# Patient Record
Sex: Female | Born: 1940 | Race: White | Hispanic: No | Marital: Married | Smoking: Never smoker
Health system: Southern US, Community
[De-identification: ages and names within clinical notes are randomized; demographics above are authoritative.]

## PROBLEM LIST (undated history)

## (undated) DIAGNOSIS — E7800 Pure hypercholesterolemia, unspecified: Secondary | ICD-10-CM

## (undated) DIAGNOSIS — K209 Esophagitis, unspecified without bleeding: Secondary | ICD-10-CM

## (undated) DIAGNOSIS — E039 Hypothyroidism, unspecified: Secondary | ICD-10-CM

## (undated) DIAGNOSIS — I10 Essential (primary) hypertension: Secondary | ICD-10-CM

## (undated) DIAGNOSIS — N809 Endometriosis, unspecified: Secondary | ICD-10-CM

## (undated) DIAGNOSIS — Z8742 Personal history of other diseases of the female genital tract: Secondary | ICD-10-CM

## (undated) DIAGNOSIS — M1990 Unspecified osteoarthritis, unspecified site: Secondary | ICD-10-CM

## (undated) DIAGNOSIS — B019 Varicella without complication: Secondary | ICD-10-CM

## (undated) DIAGNOSIS — K219 Gastro-esophageal reflux disease without esophagitis: Secondary | ICD-10-CM

## (undated) HISTORY — DX: Essential (primary) hypertension: I10

## (undated) HISTORY — DX: Esophagitis, unspecified without bleeding: K20.90

## (undated) HISTORY — DX: Personal history of other diseases of the female genital tract: Z87.42

## (undated) HISTORY — DX: Unspecified osteoarthritis, unspecified site: M19.90

## (undated) HISTORY — PX: ABDOMINAL SURGERY: SHX537

## (undated) HISTORY — DX: Hypothyroidism, unspecified: E03.9

## (undated) HISTORY — DX: Gastro-esophageal reflux disease without esophagitis: K21.9

## (undated) HISTORY — DX: Varicella without complication: B01.9

## (undated) HISTORY — DX: Endometriosis, unspecified: N80.9

## (undated) HISTORY — PX: APPENDECTOMY: SHX54

## (undated) HISTORY — DX: Pure hypercholesterolemia, unspecified: E78.00

## (undated) HISTORY — DX: Esophagitis, unspecified: K20.9

---

## 2005-05-29 ENCOUNTER — Ambulatory Visit: Payer: Self-pay | Admitting: Internal Medicine

## 2006-03-22 ENCOUNTER — Ambulatory Visit: Payer: Self-pay | Admitting: Gastroenterology

## 2006-05-11 ENCOUNTER — Ambulatory Visit: Payer: Self-pay | Admitting: Internal Medicine

## 2006-06-15 ENCOUNTER — Ambulatory Visit: Payer: Self-pay | Admitting: Internal Medicine

## 2007-06-18 ENCOUNTER — Ambulatory Visit: Payer: Self-pay | Admitting: Internal Medicine

## 2008-06-19 ENCOUNTER — Ambulatory Visit: Payer: Self-pay | Admitting: Internal Medicine

## 2008-11-25 ENCOUNTER — Ambulatory Visit: Payer: Self-pay | Admitting: Urology

## 2008-12-03 ENCOUNTER — Ambulatory Visit: Payer: Self-pay | Admitting: Urology

## 2009-01-18 ENCOUNTER — Ambulatory Visit: Payer: Self-pay | Admitting: Internal Medicine

## 2009-08-05 ENCOUNTER — Ambulatory Visit: Payer: Self-pay | Admitting: Internal Medicine

## 2010-08-08 ENCOUNTER — Ambulatory Visit: Payer: Self-pay | Admitting: Internal Medicine

## 2011-10-02 ENCOUNTER — Ambulatory Visit: Payer: Self-pay | Admitting: Internal Medicine

## 2011-11-15 ENCOUNTER — Ambulatory Visit: Payer: Self-pay | Admitting: Internal Medicine

## 2011-11-22 ENCOUNTER — Ambulatory Visit: Payer: Self-pay | Admitting: Obstetrics and Gynecology

## 2012-08-26 ENCOUNTER — Ambulatory Visit: Payer: Self-pay | Admitting: Gastroenterology

## 2012-08-27 LAB — PATHOLOGY REPORT

## 2012-09-04 LAB — HM COLONOSCOPY

## 2012-10-21 ENCOUNTER — Encounter: Payer: Self-pay | Admitting: Internal Medicine

## 2012-10-21 ENCOUNTER — Other Ambulatory Visit (HOSPITAL_COMMUNITY)
Admission: RE | Admit: 2012-10-21 | Discharge: 2012-10-21 | Disposition: A | Discharge status: Home / Self Care | Payer: 59 | Source: Ambulatory Visit | Attending: Internal Medicine | Admitting: Internal Medicine

## 2012-10-21 ENCOUNTER — Ambulatory Visit (INDEPENDENT_AMBULATORY_CARE_PROVIDER_SITE_OTHER): Payer: 59 | Admitting: Internal Medicine

## 2012-10-21 VITALS — BP 120/80 | HR 74 | Temp 98.4°F | Ht 62.5 in | Wt 138.2 lb

## 2012-10-21 DIAGNOSIS — Z1151 Encounter for screening for human papillomavirus (HPV): Secondary | ICD-10-CM | POA: Insufficient documentation

## 2012-10-21 DIAGNOSIS — E7800 Pure hypercholesterolemia, unspecified: Secondary | ICD-10-CM

## 2012-10-21 DIAGNOSIS — Z139 Encounter for screening, unspecified: Secondary | ICD-10-CM

## 2012-10-21 DIAGNOSIS — I10 Essential (primary) hypertension: Secondary | ICD-10-CM

## 2012-10-21 DIAGNOSIS — Z01419 Encounter for gynecological examination (general) (routine) without abnormal findings: Secondary | ICD-10-CM | POA: Insufficient documentation

## 2012-10-21 DIAGNOSIS — R17 Unspecified jaundice: Secondary | ICD-10-CM

## 2012-10-21 DIAGNOSIS — E039 Hypothyroidism, unspecified: Secondary | ICD-10-CM

## 2012-10-21 DIAGNOSIS — R319 Hematuria, unspecified: Secondary | ICD-10-CM

## 2012-10-21 LAB — BASIC METABOLIC PANEL
CO2: 26 mEq/L (ref 19–32)
Calcium: 9.8 mg/dL (ref 8.4–10.5)
GFR: 95 mL/min (ref >60.00)
Potassium: 3.8 mEq/L (ref 3.5–5.1)
Sodium: 140 mEq/L (ref 135–145)

## 2012-10-21 LAB — LIPID PANEL
HDL: 50.3 mg/dL (ref >39.00)
LDL Cholesterol: 89 mg/dL (ref 0–99)
Total CHOL/HDL Ratio: 4
VLDL: 39.8 mg/dL (ref 0.0–40.0)

## 2012-10-21 LAB — HEPATIC FUNCTION PANEL
ALT: 29 U/L (ref 0–35)
Bilirubin, Direct: 0.1 mg/dL (ref 0.0–0.3)
Total Bilirubin: 1.3 mg/dL — ABNORMAL HIGH (ref 0.3–1.2)

## 2012-10-21 LAB — TSH: TSH: 1.11 u[IU]/mL (ref 0.35–5.50)

## 2012-10-21 MED ORDER — LEVOTHYROXINE SODIUM 88 MCG PO TABS: 88.0000 ug | ORAL_TABLET | Freq: Every day | ORAL | Status: DC

## 2012-10-21 MED ORDER — POTASSIUM CHLORIDE CRYS ER 20 MEQ PO TBCR: 20.0000 meq | EXTENDED_RELEASE_TABLET | Freq: Every day | ORAL | Status: DC

## 2012-10-21 MED ORDER — SIMVASTATIN 10 MG PO TABS: 10.0000 mg | ORAL_TABLET | Freq: Every day | ORAL | Status: DC

## 2012-10-21 MED ORDER — LISINOPRIL-HYDROCHLOROTHIAZIDE 20-25 MG PO TABS: 1.0000 | ORAL_TABLET | Freq: Every day | ORAL | Status: DC

## 2012-10-21 MED ORDER — OMEPRAZOLE 20 MG PO CPDR: 20.0000 mg | DELAYED_RELEASE_CAPSULE | Freq: Every day | ORAL | Status: DC

## 2012-10-21 NOTE — Progress Notes (Signed)
Subjective:    Patient ID: Crystal Lynch, female    DOB: 1940/11/08, 71 y.o.   MRN: [id number]  HPI 71 year old female with past history of hypertension, hypothyroidism and hypercholesterolemia who comes in today to follow up on these issues as well as for a complete physical exam.  She states she has been doing well.  No cardiac symptoms with increased activity or exertion.  Breathing stable.  No acid reflux.  Blood pressures averaging - 120-130s (occasionally 140)/70s.  No urine or bowel change. Has been released by Dr Greggory Keen.    Past Medical History  Diagnosis Date  . Hypertension   . Hypercholesterolemia   . Esophagitis     s/p esophageal dilatation  . Hypothyroidism   . Endometriosis   . History of ovarian cyst   . Arthritis   . GERD (gastroesophageal reflux disease)   . Chicken pox     Current Outpatient Prescriptions on File Prior to Visit  Medication Sig Dispense Refill  . Calcium Carbonate-Vitamin D (CALTRATE 600+D) 600-400 MG-UNIT per tablet Take 1 tablet by mouth 2 (two) times daily.      Marland Kitchen levothyroxine (SYNTHROID, LEVOTHROID) 88 MCG tablet Take 1 tablet (88 mcg total) by mouth daily.  90 tablet  3  . omeprazole (PRILOSEC) 20 MG capsule Take 1 capsule (20 mg total) by mouth daily.  90 capsule  3  . potassium chloride SA (K-DUR,KLOR-CON) 20 MEQ tablet Take 1 tablet (20 mEq total) by mouth daily.  90 tablet  3  . simvastatin (ZOCOR) 10 MG tablet Take 1 tablet (10 mg total) by mouth at bedtime.  90 tablet  3    Review of Systems Patient denies any headache, lightheadedness or dizziness.  No chest pain, tightness or palpitations.  No increased shortness of breath, cough or congestion.  No nausea or vomiting.  No acid reflux.  No abdominal pain or cramping.  No bowel change, such as diarrhea, constipation, BRBPR or melana.  No urine change.        Objective:   Physical Exam Filed Vitals:   10/21/12 0826  BP: 120/80  Pulse: 74  Temp: 98.4 F (36.9 C)   Blood  pressure recheck:  31/67  71 year old female in no acute distress.   HEENT:  Nares- clear.  Oropharynx - without lesions. NECK:  Supple.  Nontender.  No audible bruit.  HEART:  Appears to be regular. LUNGS:  No crackles or wheezing audible.  Respirations even and unlabored.  RADIAL PULSE:  Equal bilaterally.    BREASTS:  No nipple discharge or nipple retraction present.  Could not appreciate any distinct nodules or axillary adenopathy.  ABDOMEN:  Soft, nontender.  Bowel sounds present and normal.  No audible abdominal bruit.  GU:  Normal external genitalia.  Vaginal vault without lesions.  Cervix identified.  Pap performed.  Some atrophy changes present.  Could not appreciate any adnexal masses or tenderness.   RECTAL:  Heme negative.   EXTREMITIES:  No increased edema present.  DP pulses palpable and equal bilaterally.           Assessment & Plan:  FAMILY HISTORY OF BREAST CANCER.  Again discussed with her today regarding genetic testing.  Schedule a follow up mammogram.   PREVIOUS PELVIC PAIN.  Has been released by Dr Greggory Keen.  Doing well.    HEALTH MAINTENANCE.  Physical today.  Schedule mammogram.  Colonoscopy 09/05/12.  Obtain results.  Normal bone density 09/19/10.  Pneumovax 09/26/11.  Has  had shingles vaccine.

## 2012-10-22 LAB — CBC WITH DIFFERENTIAL/PLATELET
Basophils Absolute: 0 10*3/uL (ref 0.0–0.1)
Eosinophils Relative: 2.5 % (ref 0.0–5.0)
Monocytes Relative: 3.4 % (ref 3.0–12.0)
Neutrophils Relative %: 62.4 % (ref 43.0–77.0)
Platelets: 236 10*3/uL (ref 150.0–400.0)
RDW: 12.8 % (ref 11.5–14.6)
WBC: 5.3 10*3/uL (ref 4.5–10.5)

## 2012-10-23 ENCOUNTER — Telehealth: Payer: Self-pay | Admitting: Internal Medicine

## 2012-10-23 NOTE — Telephone Encounter (Signed)
Pt notified of lab results (left message on her machine).  Bilirubin just slightly elevated.  Probably normal variant.  Will recheck liver panel in one month.  Left message for pt that someone would call and schedule her a nonfasting lab appt in one month.  Please schedule and notify pt.  Thanks.

## 2012-10-25 ENCOUNTER — Encounter: Payer: Self-pay | Admitting: *Deleted

## 2012-10-27 ENCOUNTER — Encounter: Payer: Self-pay | Admitting: Internal Medicine

## 2012-10-27 DIAGNOSIS — R319 Hematuria, unspecified: Secondary | ICD-10-CM | POA: Insufficient documentation

## 2012-10-27 NOTE — Assessment & Plan Note (Signed)
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  Follow.

## 2012-10-27 NOTE — Assessment & Plan Note (Signed)
Blood pressure under good control.  Same medication.  Check metabolic panel.    

## 2012-10-27 NOTE — Assessment & Plan Note (Signed)
Low cholesterol diet and exercise.  On simvastatin.  Check lipid panel and liver function.    

## 2012-10-27 NOTE — Assessment & Plan Note (Signed)
Symptoms controlled on omeprazole.   

## 2012-10-27 NOTE — Assessment & Plan Note (Signed)
On thyroid replacement.  Check tsh.  

## 2012-10-27 NOTE — Assessment & Plan Note (Signed)
Had urine cytology and renal ultrasound and then subsequent dye study and states everything checked out fine.  Urinalysis 02/27/11 - 1-4 rbc's.  Stable.  Follow.

## 2012-11-05 NOTE — Telephone Encounter (Signed)
Husband is going to have his wife call us back to schedule

## 2012-11-08 ENCOUNTER — Encounter: Payer: Self-pay | Admitting: Internal Medicine

## 2012-11-13 ENCOUNTER — Other Ambulatory Visit (INDEPENDENT_AMBULATORY_CARE_PROVIDER_SITE_OTHER): Payer: 59

## 2012-11-13 DIAGNOSIS — R17 Unspecified jaundice: Secondary | ICD-10-CM

## 2012-11-13 LAB — HEPATIC FUNCTION PANEL
AST: 25 U/L (ref 0–37)
Albumin: 4.3 g/dL (ref 3.5–5.2)
Alkaline Phosphatase: 53 U/L (ref 39–117)
Total Protein: 7.1 g/dL (ref 6.0–8.3)

## 2012-11-19 ENCOUNTER — Ambulatory Visit: Payer: Self-pay | Admitting: Internal Medicine

## 2012-11-27 ENCOUNTER — Encounter: Payer: Self-pay | Admitting: Internal Medicine

## 2013-01-06 ENCOUNTER — Ambulatory Visit (INDEPENDENT_AMBULATORY_CARE_PROVIDER_SITE_OTHER): Payer: 59 | Admitting: Internal Medicine

## 2013-01-06 ENCOUNTER — Encounter: Payer: Self-pay | Admitting: Internal Medicine

## 2013-01-06 ENCOUNTER — Telehealth: Payer: Self-pay | Admitting: Internal Medicine

## 2013-01-06 VITALS — BP 124/80 | HR 76 | Temp 98.3°F | Ht 62.5 in | Wt 139.0 lb

## 2013-01-06 DIAGNOSIS — I10 Essential (primary) hypertension: Secondary | ICD-10-CM

## 2013-01-06 DIAGNOSIS — N390 Urinary tract infection, site not specified: Secondary | ICD-10-CM

## 2013-01-06 LAB — POCT URINALYSIS DIPSTICK
Bilirubin, UA: NEGATIVE
Nitrite, UA: POSITIVE
pH, UA: 5

## 2013-01-06 MED ORDER — CIPROFLOXACIN HCL 500 MG PO TABS: 500.0000 mg | ORAL_TABLET | Freq: Two times a day (BID) | ORAL | Status: DC

## 2013-01-06 NOTE — Telephone Encounter (Signed)
Caller: Crystal Lynch/Patient; Phone: [phone]; Reason for Call: Pt calling today requesting antibiotics be called in for UA.  Advised pt office policy is that antibiotics are not usually called in, the MD likes to see pt in office.  Having blood in urine.  Declines triage assessment.  Said she is willing to come in just for a U/A and have MD call in the antibiotics.  Started having pain with urination on Saturday and started with blood in urine on Sunday.  Has been taking AZO OTC for urinary pain.  PLEASE CALL PT BACK AT [phone] TO LET HER KNOW IF MD WILL CALL IN ANTIBIOTICS OR IF SHE CAN JUST COME IN FOR U/A.  SAID SHE HAD HER PHYSICAL 2 MONTHS AGO.  Thanks.

## 2013-01-06 NOTE — Telephone Encounter (Addendum)
Called patient back, I explained to patient that you will try to fit her in for office visit.  Made appt for today for patient to be seen.

## 2013-01-06 NOTE — Telephone Encounter (Signed)
It is a clinic decision - will have to be seen to treat.  I can work her in for this.

## 2013-01-07 ENCOUNTER — Encounter: Payer: Self-pay | Admitting: Internal Medicine

## 2013-01-07 NOTE — Progress Notes (Signed)
  Subjective:    Patient ID: Crystal Lynch, female    DOB: [birth year], 72 y.o.   MRN: [id number]  Urinary Tract Infection   72 year old female with past history of hypertension, hypothyroidism and hypercholesterolemia who comes in today as a work in with concerns regarding increased urinary frequency and discomfort.  She states her symptoms started a few days ago - with increased urinary frequency.  Noticed blood in her urine yesterday am.  Some increased lower abdominal pressure.  Some minimal back pain.  Symptoms improved today (some) after taking AZO.  Eating and drinking well.  No nausea or vomiting.  Has taken cipro previously.  Tolerated.  No vaginal symptoms.      Past Medical History  Diagnosis Date  . Hypertension   . Hypercholesterolemia   . Esophagitis     s/p esophageal dilatation  . Hypothyroidism   . Endometriosis   . History of ovarian cyst   . Arthritis   . GERD (gastroesophageal reflux disease)   . Chicken pox     Current Outpatient Prescriptions on File Prior to Visit  Medication Sig Dispense Refill  . aspirin 81 MG tablet Take 81 mg by mouth daily.      . Calcium Carbonate-Vitamin D (CALTRATE 600+D) 600-400 MG-UNIT per tablet Take 1 tablet by mouth 2 (two) times daily.      . Glucosamine-Chondroitin ([facility] GLUCOSAMINE-CHONDROITIN) 750-600 MG TABS Take 600 mg by mouth 2 (two) times daily.      Marland Kitchen levothyroxine (SYNTHROID, LEVOTHROID) 88 MCG tablet Take 1 tablet (88 mcg total) by mouth daily.  90 tablet  3  . lisinopril-hydrochlorothiazide (PRINZIDE,ZESTORETIC) 20-25 MG per tablet Take 1 tablet by mouth daily.  90 tablet  3  . Multiple Vitamin (MULTIVITAMIN) tablet Take 1 tablet by mouth daily.      Marland Kitchen omeprazole (PRILOSEC) 20 MG capsule Take 1 capsule (20 mg total) by mouth daily.  90 capsule  3  . potassium chloride SA (K-DUR,KLOR-CON) 20 MEQ tablet Take 1 tablet (20 mEq total) by mouth daily.  90 tablet  3  . simvastatin (ZOCOR) 10 MG tablet Take 1 tablet (10 mg total)  by mouth at bedtime.  90 tablet  3   No current facility-administered medications on file prior to visit.    Review of Systems Urinary symptoms as outlined.  Noted increased frequency, then dysuria.  Some hematuria yesterday am.  AZO helped symptoms.  No nausea or vomiting.  No bowel change.  Eating and drinking well. No vaginal symptoms.          Objective:   Physical Exam  Filed Vitals:   01/06/13 1623  BP: 124/80  Pulse: 76  Temp: 98.3 F (57.60 C)   72 year old female in no acute distress.  HEART:  Appears to be regular. LUNGS:  No crackles or wheezing audible.  Respirations even and unlabored. ABDOMEN:  Soft.  Minimal suprapubic tenderness to palpation.  Bowel sounds present and normal.  No audible abdominal bruit.  BACK:  No CVA tenderness.  nontender.       Assessment & Plan:  UTI.  Urine dip appears consistent with a urinary tract infection.  Will send for culture.  Treat with cipro 500mg  bid x 5 days.  Follow.     HEALTH MAINTENANCE.  Physical last visit.  Colonoscopy 09/05/12.  Obtain results.  Normal bone density 09/19/10.  Pneumovax 09/26/11.  Has had shingles vaccine.

## 2013-01-07 NOTE — Assessment & Plan Note (Signed)
Blood pressure under good control.  Same medication.

## 2013-01-08 LAB — URINE CULTURE

## 2013-01-10 ENCOUNTER — Telehealth: Payer: Self-pay | Admitting: Internal Medicine

## 2013-01-10 ENCOUNTER — Other Ambulatory Visit: Payer: Self-pay | Admitting: Internal Medicine

## 2013-01-10 DIAGNOSIS — R319 Hematuria, unspecified: Secondary | ICD-10-CM

## 2013-01-10 NOTE — Telephone Encounter (Signed)
Returning call . Waiting to know the results of her blood work.

## 2013-01-10 NOTE — Progress Notes (Signed)
Order placed for a follow up urine.

## 2013-01-10 NOTE — Telephone Encounter (Signed)
Patient notified.  Appt made.

## 2013-01-24 ENCOUNTER — Telehealth: Payer: Self-pay | Admitting: *Deleted

## 2013-01-24 NOTE — Telephone Encounter (Signed)
Pt is coming in for labs Monday 04.07.2014, is it just for a urine?  Thank you

## 2013-01-26 NOTE — Telephone Encounter (Signed)
Yes, just a urine.  Can you hold for culture?

## 2013-01-27 ENCOUNTER — Telehealth: Payer: Self-pay | Admitting: *Deleted

## 2013-01-27 ENCOUNTER — Ambulatory Visit: Payer: 59

## 2013-01-27 ENCOUNTER — Other Ambulatory Visit (INDEPENDENT_AMBULATORY_CARE_PROVIDER_SITE_OTHER): Payer: 59

## 2013-01-27 DIAGNOSIS — R319 Hematuria, unspecified: Secondary | ICD-10-CM

## 2013-01-27 LAB — URINALYSIS, ROUTINE W REFLEX MICROSCOPIC
Ketones, ur: NEGATIVE
Leukocytes, UA: NEGATIVE
Specific Gravity, Urine: 1.015 (ref 1.000–1.030)
Urobilinogen, UA: 0.2 (ref 0.0–1.0)

## 2013-01-27 NOTE — Telephone Encounter (Signed)
Please add a urine culture.  Let me know if you are not able.

## 2013-01-27 NOTE — Telephone Encounter (Signed)
Im going to send the add request form

## 2013-01-28 LAB — URINE CULTURE: Organism ID, Bacteria: NO GROWTH

## 2013-01-29 ENCOUNTER — Encounter: Payer: Self-pay | Admitting: Internal Medicine

## 2013-04-22 ENCOUNTER — Ambulatory Visit (INDEPENDENT_AMBULATORY_CARE_PROVIDER_SITE_OTHER): Payer: 59 | Admitting: Internal Medicine

## 2013-04-22 ENCOUNTER — Encounter: Payer: Self-pay | Admitting: Internal Medicine

## 2013-04-22 VITALS — BP 130/80 | HR 65 | Temp 98.0°F | Ht 62.5 in | Wt 132.2 lb

## 2013-04-22 DIAGNOSIS — R319 Hematuria, unspecified: Secondary | ICD-10-CM

## 2013-04-22 DIAGNOSIS — N390 Urinary tract infection, site not specified: Secondary | ICD-10-CM

## 2013-04-22 DIAGNOSIS — I10 Essential (primary) hypertension: Secondary | ICD-10-CM

## 2013-04-22 DIAGNOSIS — R17 Unspecified jaundice: Secondary | ICD-10-CM

## 2013-04-22 DIAGNOSIS — E7800 Pure hypercholesterolemia, unspecified: Secondary | ICD-10-CM

## 2013-04-22 DIAGNOSIS — E039 Hypothyroidism, unspecified: Secondary | ICD-10-CM

## 2013-04-22 LAB — HEPATIC FUNCTION PANEL
ALT: 22 U/L (ref 0–35)
AST: 21 U/L (ref 0–37)
Alkaline Phosphatase: 55 U/L (ref 39–117)
Bilirubin, Direct: 0.2 mg/dL (ref 0.0–0.3)
Total Bilirubin: 1.4 mg/dL — ABNORMAL HIGH (ref 0.3–1.2)
Total Protein: 6.7 g/dL (ref 6.0–8.3)

## 2013-04-22 LAB — POCT URINALYSIS DIPSTICK
Bilirubin, UA: NEGATIVE
Glucose, UA: NEGATIVE
Ketones, UA: NEGATIVE
Leukocytes, UA: NEGATIVE
Nitrite, UA: NEGATIVE
pH, UA: 7

## 2013-04-22 LAB — LIPID PANEL
LDL Cholesterol: 105 mg/dL — ABNORMAL HIGH (ref 0–99)
Total CHOL/HDL Ratio: 4

## 2013-04-22 LAB — BASIC METABOLIC PANEL
CO2: 28 mEq/L (ref 19–32)
Chloride: 101 mEq/L (ref 96–112)
Creatinine, Ser: 0.8 mg/dL (ref 0.4–1.2)
Potassium: 4 mEq/L (ref 3.5–5.1)

## 2013-04-23 ENCOUNTER — Encounter: Payer: Self-pay | Admitting: Internal Medicine

## 2013-04-23 NOTE — Assessment & Plan Note (Signed)
Had urine cytology and renal ultrasound and then subsequent dye study and states everything checked out fine.  Urinalysis 02/27/11 - 1-4 rbc's.  Stable.  Will recheck urinalysis today given increased frequency.

## 2013-04-23 NOTE — Assessment & Plan Note (Signed)
Low cholesterol diet and exercise.  On simvastatin.  Check lipid panel and liver function.    

## 2013-04-23 NOTE — Assessment & Plan Note (Signed)
Blood pressure under good control.  Same medication.  Follow metabolic panel.   

## 2013-04-23 NOTE — Progress Notes (Signed)
Subjective:    Patient ID: Crystal Lynch, female    DOB: 12-Sep-1941, 72 y.o.   MRN: [id number]  Urinary Frequency  Associated symptoms include frequency.  72 year old female with past history of hypertension, hypothyroidism and hypercholesterolemia who comes in today for a scheduled follow up.  She states she has been doing well.  No cardiac symptoms with increased activity or exertion.  Breathing stable.  No acid reflux.  Blood pressures doing well.  No bowel change. Has been released by Dr Greggory Keen.  She does report some increased urinary frequency.  No dysuria.  Still occasionally will notice some minimal right lower quadrant discomfort.  No persistent.  Denies now.     Past Medical History  Diagnosis Date  . Hypertension   . Hypercholesterolemia   . Esophagitis     s/p esophageal dilatation  . Hypothyroidism   . Endometriosis   . History of ovarian cyst   . Arthritis   . GERD (gastroesophageal reflux disease)   . Chicken pox     Current Outpatient Prescriptions on File Prior to Visit  Medication Sig Dispense Refill  . aspirin 81 MG tablet Take 81 mg by mouth daily.      . Calcium Carbonate-Vitamin D (CALTRATE 600+D) 600-400 MG-UNIT per tablet Take 1 tablet by mouth 2 (two) times daily.      . Glucosamine-Chondroitin ([facility] GLUCOSAMINE-CHONDROITIN) 750-600 MG TABS Take 600 mg by mouth 2 (two) times daily.      Marland Kitchen levothyroxine (SYNTHROID, LEVOTHROID) 88 MCG tablet Take 1 tablet (88 mcg total) by mouth daily.  90 tablet  3  . lisinopril-hydrochlorothiazide (PRINZIDE,ZESTORETIC) 20-25 MG per tablet Take 1 tablet by mouth daily.  90 tablet  3  . Multiple Vitamin (MULTIVITAMIN) tablet Take 1 tablet by mouth daily.      Marland Kitchen omeprazole (PRILOSEC) 20 MG capsule Take 1 capsule (20 mg total) by mouth daily.  90 capsule  3  . potassium chloride SA (K-DUR,KLOR-CON) 20 MEQ tablet Take 1 tablet (20 mEq total) by mouth daily.  90 tablet  3  . simvastatin (ZOCOR) 10 MG tablet Take 1 tablet (10 mg  total) by mouth at bedtime.  90 tablet  3   No current facility-administered medications on file prior to visit.    Review of Systems  Genitourinary: Positive for frequency.  Patient denies any headache, lightheadedness or dizziness.  No significant sinus or allergy symptoms.  No chest pain, tightness or palpitations.  No increased shortness of breath, cough or congestion.  No nausea or vomiting.  No acid reflux.  No abdominal pain or cramping currently.  Occasionally will notice some minimal right lower quadrant discomfort.  No bowel change, such as diarrhea, constipation, BRBPR or melana.  Some urinary frequency as outlined.  No hematuria and no dysuria.         Objective:   Physical Exam  Filed Vitals:   04/22/13 0819  BP: 130/80  Pulse: 65  Temp: 98 F (36.7 C)   Pulse 43  72 year old female in no acute distress.   HEENT:  Nares- clear.  Oropharynx - without lesions. NECK:  Supple.  Nontender.  No audible bruit.  HEART:  Appears to be regular. LUNGS:  No crackles or wheezing audible.  Respirations even and unlabored.  RADIAL PULSE:  Equal bilaterally.   ABDOMEN:  Soft, nontender.  Bowel sounds present and normal.  No audible abdominal bruit.  EXTREMITIES:  No increased edema present.  DP pulses palpable and equal  bilaterally.           Assessment & Plan:  FAMILY HISTORY OF BREAST CANCER.  Have discussed with her regarding genetic testing.  She desires not to pursue.    PREVIOUS PELVIC PAIN.  Has been released by Dr Greggory Keen.  Still some intermittent discomfort.  None now.  Wants to monitor.  Will follow.   HEALTH MAINTENANCE.  Physical 10/21/12.   Mammogram 11/19/12- [facility].  Colonoscopy 09/05/12.  Normal bone density 09/19/10.  Pneumovax 09/26/11.  Has had shingles vaccine.

## 2013-04-23 NOTE — Assessment & Plan Note (Signed)
Symptoms controlled on omeprazole.   

## 2013-04-23 NOTE — Assessment & Plan Note (Signed)
On thyroid replacement.  Follow tsh.  Last tsh wnl.   

## 2013-04-23 NOTE — Assessment & Plan Note (Signed)
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  Follow.  Last bilirubin wnl.   

## 2013-04-24 ENCOUNTER — Other Ambulatory Visit: Payer: Self-pay | Admitting: Internal Medicine

## 2013-04-24 DIAGNOSIS — R319 Hematuria, unspecified: Secondary | ICD-10-CM

## 2013-04-24 LAB — URINE CULTURE: Colony Count: 15000

## 2013-04-24 NOTE — Progress Notes (Signed)
Ordered f/u urinalysis

## 2013-08-06 ENCOUNTER — Telehealth: Payer: Self-pay | Admitting: *Deleted

## 2013-08-06 NOTE — Telephone Encounter (Signed)
Refill Request:  Pot chlor ER (Klor-Con M20) TB 20 MEQ  #90  Take one tablet daily

## 2013-08-07 ENCOUNTER — Other Ambulatory Visit: Payer: Self-pay | Admitting: *Deleted

## 2013-08-07 MED ORDER — POTASSIUM CHLORIDE CRYS ER 20 MEQ PO TBCR: 20.0000 meq | EXTENDED_RELEASE_TABLET | Freq: Every day | ORAL | Status: DC

## 2013-08-12 ENCOUNTER — Telehealth: Payer: Self-pay | Admitting: Internal Medicine

## 2013-08-12 NOTE — Telephone Encounter (Signed)
The patient is out of town and she is needing an antibiotic for a UTI . If you call this medication into the pharmacy the [facility] is [facility] [place] ,[place] phone# [phone] .

## 2013-08-12 NOTE — Telephone Encounter (Signed)
Please call pt and let her know that we are unable now to call in abx over the phone without seeing the pt.  I can see her when she returns (if coming back soon) or she will need eval while there - at urgent care.

## 2013-08-12 NOTE — Telephone Encounter (Signed)
Please advise 

## 2013-08-12 NOTE — Telephone Encounter (Signed)
Left detailed voicemail

## 2013-09-01 ENCOUNTER — Other Ambulatory Visit: Payer: Self-pay | Admitting: Internal Medicine

## 2013-09-30 ENCOUNTER — Other Ambulatory Visit: Payer: Self-pay | Admitting: Internal Medicine

## 2013-10-26 ENCOUNTER — Other Ambulatory Visit: Payer: Self-pay | Admitting: Internal Medicine

## 2013-10-26 DIAGNOSIS — R319 Hematuria, unspecified: Secondary | ICD-10-CM

## 2013-10-26 DIAGNOSIS — E039 Hypothyroidism, unspecified: Secondary | ICD-10-CM

## 2013-10-26 DIAGNOSIS — E7800 Pure hypercholesterolemia, unspecified: Secondary | ICD-10-CM

## 2013-10-26 DIAGNOSIS — I10 Essential (primary) hypertension: Secondary | ICD-10-CM

## 2013-10-27 ENCOUNTER — Encounter: Payer: Self-pay | Admitting: Internal Medicine

## 2013-10-27 ENCOUNTER — Ambulatory Visit (INDEPENDENT_AMBULATORY_CARE_PROVIDER_SITE_OTHER): Payer: 59 | Admitting: Internal Medicine

## 2013-10-27 ENCOUNTER — Encounter (INDEPENDENT_AMBULATORY_CARE_PROVIDER_SITE_OTHER): Payer: Self-pay

## 2013-10-27 VITALS — BP 120/80 | HR 81 | Temp 98.1°F | Ht 62.25 in | Wt 138.8 lb

## 2013-10-27 DIAGNOSIS — R17 Unspecified jaundice: Secondary | ICD-10-CM

## 2013-10-27 DIAGNOSIS — R319 Hematuria, unspecified: Secondary | ICD-10-CM

## 2013-10-27 DIAGNOSIS — E039 Hypothyroidism, unspecified: Secondary | ICD-10-CM

## 2013-10-27 DIAGNOSIS — K209 Esophagitis, unspecified without bleeding: Secondary | ICD-10-CM

## 2013-10-27 DIAGNOSIS — E7800 Pure hypercholesterolemia, unspecified: Secondary | ICD-10-CM

## 2013-10-27 DIAGNOSIS — I10 Essential (primary) hypertension: Secondary | ICD-10-CM

## 2013-10-27 DIAGNOSIS — E2839 Other primary ovarian failure: Secondary | ICD-10-CM

## 2013-10-27 DIAGNOSIS — Z780 Asymptomatic menopausal state: Secondary | ICD-10-CM

## 2013-10-27 LAB — URINALYSIS, ROUTINE W REFLEX MICROSCOPIC
BILIRUBIN URINE: NEGATIVE
KETONES UR: NEGATIVE
LEUKOCYTES UA: NEGATIVE
Nitrite: NEGATIVE
PH: 6 (ref 5.0–8.0)
SPECIFIC GRAVITY, URINE: 1.015 (ref 1.000–1.030)
Total Protein, Urine: NEGATIVE
URINE GLUCOSE: NEGATIVE
Urobilinogen, UA: 0.2 (ref 0.0–1.0)

## 2013-10-27 LAB — CBC WITH DIFFERENTIAL/PLATELET
BASOS ABS: 0 10*3/uL (ref 0.0–0.1)
Basophils Relative: 0.3 % (ref 0.0–3.0)
EOS ABS: 0.1 10*3/uL (ref 0.0–0.7)
Eosinophils Relative: 1.9 % (ref 0.0–5.0)
HEMATOCRIT: 44.1 % (ref 36.0–46.0)
HEMOGLOBIN: 15.1 g/dL — AB (ref 12.0–15.0)
Lymphocytes Relative: 35.4 % (ref 12.0–46.0)
Lymphs Abs: 2.2 10*3/uL (ref 0.7–4.0)
MCHC: 34.3 g/dL (ref 30.0–36.0)
MCV: 88.8 fl (ref 78.0–100.0)
Monocytes Absolute: 0.4 10*3/uL (ref 0.1–1.0)
Monocytes Relative: 7.3 % (ref 3.0–12.0)
NEUTROS ABS: 3.4 10*3/uL (ref 1.4–7.7)
Neutrophils Relative %: 55.1 % (ref 43.0–77.0)
Platelets: 250 10*3/uL (ref 150.0–400.0)
RBC: 4.96 Mil/uL (ref 3.87–5.11)
RDW: 13.2 % (ref 11.5–14.6)
WBC: 6.1 10*3/uL (ref 4.5–10.5)

## 2013-10-27 LAB — HEPATIC FUNCTION PANEL
ALT: 30 U/L (ref 0–35)
AST: 28 U/L (ref 0–37)
Albumin: 4.6 g/dL (ref 3.5–5.2)
Alkaline Phosphatase: 51 U/L (ref 39–117)
BILIRUBIN TOTAL: 1.2 mg/dL (ref 0.3–1.2)
Bilirubin, Direct: 0.1 mg/dL (ref 0.0–0.3)
Total Protein: 7.2 g/dL (ref 6.0–8.3)

## 2013-10-27 LAB — LIPID PANEL
Cholesterol: 192 mg/dL (ref 0–200)
HDL: 55.4 mg/dL (ref >39.00)
LDL Cholesterol: 114 mg/dL — ABNORMAL HIGH (ref 0–99)
Total CHOL/HDL Ratio: 3
Triglycerides: 113 mg/dL (ref 0.0–149.0)
VLDL: 22.6 mg/dL (ref 0.0–40.0)

## 2013-10-27 LAB — BASIC METABOLIC PANEL
BUN: 16 mg/dL (ref 6–23)
CO2: 28 meq/L (ref 19–32)
Calcium: 10.2 mg/dL (ref 8.4–10.5)
Chloride: 99 mEq/L (ref 96–112)
Creatinine, Ser: 0.9 mg/dL (ref 0.4–1.2)
GFR: 68.58 mL/min (ref >60.00)
Glucose, Bld: 92 mg/dL (ref 70–99)
Potassium: 4.1 mEq/L (ref 3.5–5.1)
SODIUM: 136 meq/L (ref 135–145)

## 2013-10-27 LAB — TSH: TSH: 1.21 u[IU]/mL (ref 0.35–5.50)

## 2013-10-27 NOTE — Assessment & Plan Note (Signed)
Low cholesterol diet and exercise.  On simvastatin.  Check lipid panel and liver function.    

## 2013-10-27 NOTE — Progress Notes (Signed)
Pre-visit discussion using our clinic review tool. No additional management support is needed unless otherwise documented below in the visit note.  

## 2013-10-27 NOTE — Assessment & Plan Note (Signed)
Had urine cytology and renal ultrasound and then subsequent dye study and states everything checked out fine.  Follow urinalysis.  Previous uti.  Treated.  Symptoms improved.

## 2013-10-27 NOTE — Assessment & Plan Note (Signed)
On thyroid replacement.  Follow tsh.  Last tsh wnl.   

## 2013-10-27 NOTE — Assessment & Plan Note (Signed)
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  Follow.  Last bilirubin wnl.

## 2013-10-27 NOTE — Assessment & Plan Note (Signed)
Symptoms controlled on omeprazole.   

## 2013-10-27 NOTE — Assessment & Plan Note (Signed)
Blood pressure under good control.  Same medication.  Follow metabolic panel.   

## 2013-10-27 NOTE — Progress Notes (Signed)
Subjective:    Patient ID: Crystal Lynch, female    DOB: [birth year], 73 y.o.   MRN: [id number]  HPI 73 year old female with past history of hypertension, hypothyroidism and hypercholesterolemia who comes in today to follow up on these issues as well as for a complete physical exam.  She states she has been doing well.  No cardiac symptoms with increased activity or exertion.  Breathing stable.  No acid reflux.  Blood pressures averaging - 120-130s/70-80.    No urine or bowel change. Has been released by Dr Enzo Bi.  Occasionally will notice minimal right lower quadrant discomfort.  Rarely occurs.  Nothing persistent.  She was treated for UTI (10/14) with bactrim.  Symptoms have resolved.  Still some increased urinary frequency.  No vaginal problems.  Overall she feels she is doing well.    Past Medical History  Diagnosis Date  . Hypertension   . Hypercholesterolemia   . Esophagitis     s/p esophageal dilatation  . Hypothyroidism   . Endometriosis   . History of ovarian cyst   . Arthritis   . GERD (gastroesophageal reflux disease)   . Chicken pox     Current Outpatient Prescriptions on File Prior to Visit  Medication Sig Dispense Refill  . aspirin 81 MG tablet Take 81 mg by mouth daily.      . Calcium Carbonate-Vitamin D (CALTRATE 600+D) 600-400 MG-UNIT per tablet Take 1 tablet by mouth 2 (two) times daily.      . Glucosamine-Chondroitin ([facility] GLUCOSAMINE-CHONDROITIN) 750-600 MG TABS Take 600 mg by mouth 2 (two) times daily.      Marland Kitchen levothyroxine (SYNTHROID, LEVOTHROID) 88 MCG tablet TAKE 1 TABLET DAILY  90 tablet  1  . lisinopril-hydrochlorothiazide (PRINZIDE,ZESTORETIC) 20-25 MG per tablet TAKE 1 TABLET DAILY  90 tablet  1  . Multiple Vitamin (MULTIVITAMIN) tablet Take 1 tablet by mouth daily.      Marland Kitchen omeprazole (PRILOSEC) 20 MG capsule Take 1 capsule (20 mg total) by mouth daily.  90 capsule  3  . potassium chloride SA (K-DUR,KLOR-CON) 20 MEQ tablet Take 1 tablet (20 mEq total) by  mouth daily.  90 tablet  2  . simvastatin (ZOCOR) 10 MG tablet TAKE 1 TABLET AT BEDTIME  90 tablet  1   No current facility-administered medications on file prior to visit.    Review of Systems Patient denies any headache, lightheadedness or dizziness.  No significant sinus or allergy symptoms.   No chest pain, tightness or palpitations.  No increased shortness of breath, cough or congestion.  No nausea or vomiting.  No acid reflux.  No abdominal pain or cramping.  No bowel change, such as diarrhea, constipation, BRBPR or melana.  Some increased urinary frequency.  No dysuria.  No vaginal problems.        Objective:   Physical Exam  Filed Vitals:   10/27/13 0823  BP: 120/80  Pulse: 81  Temp: 98.1 F (36.7 C)   Blood pressure recheck:  54/32  73 year old female in no acute distress.   HEENT:  Nares- clear.  Oropharynx - without lesions. NECK:  Supple.  Nontender.  No audible bruit.  HEART:  Appears to be regular. LUNGS:  No crackles or wheezing audible.  Respirations even and unlabored.  RADIAL PULSE:  Equal bilaterally.    BREASTS:  No nipple discharge or nipple retraction present.  Could not appreciate any distinct nodules or axillary adenopathy.  ABDOMEN:  Soft, nontender.  Bowel sounds present and  normal.  No audible abdominal bruit.  GU:  Not performed.    EXTREMITIES:  No increased edema present.         Assessment & Plan:  FAMILY HISTORY OF BREAST CANCER.  Have discussed with her today regarding genetic testing.  Schedule a follow up mammogram.  Last 11/19/12.    PREVIOUS PELVIC PAIN.  Has been released by Dr Enzo Bi.  Doing well.    HEALTH MAINTENANCE.  Physical today.  Schedule mammogram when due.  Last mammogram 11/19/12 - ok.  Colonoscopy 09/05/12.  Normal bone density 09/19/10.  With loss of height, will recheck bone density.

## 2013-10-29 LAB — CULTURE, URINE COMPREHENSIVE
Colony Count: NO GROWTH
ORGANISM ID, BACTERIA: NO GROWTH

## 2013-11-20 ENCOUNTER — Ambulatory Visit: Payer: Self-pay | Admitting: Internal Medicine

## 2013-11-20 LAB — HM MAMMOGRAPHY

## 2013-11-24 ENCOUNTER — Ambulatory Visit (INDEPENDENT_AMBULATORY_CARE_PROVIDER_SITE_OTHER): Payer: 59 | Admitting: Adult Health

## 2013-11-24 ENCOUNTER — Encounter: Payer: Self-pay | Admitting: Adult Health

## 2013-11-24 VITALS — BP 128/66 | HR 70 | Temp 97.7°F | Resp 12 | Wt 139.0 lb

## 2013-11-24 DIAGNOSIS — R319 Hematuria, unspecified: Secondary | ICD-10-CM

## 2013-11-24 DIAGNOSIS — R509 Fever, unspecified: Secondary | ICD-10-CM

## 2013-11-24 DIAGNOSIS — J111 Influenza due to unidentified influenza virus with other respiratory manifestations: Secondary | ICD-10-CM

## 2013-11-24 LAB — POCT URINALYSIS DIPSTICK
Bilirubin, UA: NEGATIVE
GLUCOSE UA: NEGATIVE
KETONES UA: NEGATIVE
Nitrite, UA: NEGATIVE
PH UA: 6
Protein, UA: NEGATIVE
Spec Grav, UA: 1.02
Urobilinogen, UA: 0.2

## 2013-11-24 LAB — POCT INFLUENZA A/B
INFLUENZA B, POC: POSITIVE
Influenza A, POC: POSITIVE

## 2013-11-24 MED ORDER — CIPROFLOXACIN HCL 250 MG PO TABS: 250.0000 mg | ORAL_TABLET | Freq: Two times a day (BID) | ORAL | Status: DC

## 2013-11-24 MED ORDER — OSELTAMIVIR PHOSPHATE 75 MG PO CAPS: 75.0000 mg | ORAL_CAPSULE | Freq: Two times a day (BID) | ORAL | Status: DC

## 2013-11-24 MED ORDER — GUAIFENESIN-CODEINE 100-10 MG/5ML PO SOLN: 5.0000 mL | Freq: Three times a day (TID) | ORAL | Status: DC | PRN

## 2013-11-24 NOTE — Progress Notes (Signed)
   Subjective:    Patient ID: Crystal Lynch, female    DOB: 04-19-1941, 73 y.o.   MRN: [id number]  HPI  Patient is a 73 year old female who presents to clinic with the following concerns:  1. Productive cough which started on Friday. She has also been running a temperature of 101 over the weekend.  Some rhinorrhea. She also reports having body aches and discomfort. She has been taking   2. Hematuria that started today. Some dysuria. Last UTI ~ 4-5 months ago.    Current Outpatient Prescriptions on File Prior to Visit  Medication Sig Dispense Refill  . aspirin 81 MG tablet Take 81 mg by mouth daily.      . Calcium Carbonate-Vitamin D (CALTRATE 600+D) 600-400 MG-UNIT per tablet Take 1 tablet by mouth 2 (two) times daily.      . Glucosamine-Chondroitin ([facility] GLUCOSAMINE-CHONDROITIN) 750-600 MG TABS Take 600 mg by mouth 2 (two) times daily.      Marland Kitchen levothyroxine (SYNTHROID, LEVOTHROID) 88 MCG tablet TAKE 1 TABLET DAILY  90 tablet  1  . lisinopril-hydrochlorothiazide (PRINZIDE,ZESTORETIC) 20-25 MG per tablet TAKE 1 TABLET DAILY  90 tablet  1  . Multiple Vitamin (MULTIVITAMIN) tablet Take 1 tablet by mouth daily.      Marland Kitchen omeprazole (PRILOSEC) 20 MG capsule Take 1 capsule (20 mg total) by mouth daily.  90 capsule  3  . potassium chloride SA (K-DUR,KLOR-CON) 20 MEQ tablet Take 1 tablet (20 mEq total) by mouth daily.  90 tablet  2  . simvastatin (ZOCOR) 10 MG tablet TAKE 1 TABLET AT BEDTIME  90 tablet  1   No current facility-administered medications on file prior to visit.    Review of Systems  Constitutional: Positive for fever and chills.       Body aches.  HENT: Positive for rhinorrhea. Negative for sore throat.   Respiratory: Positive for cough. Negative for shortness of breath and wheezing.   Genitourinary: Positive for dysuria, urgency, frequency and flank pain. Negative for hematuria.       Objective:   Physical Exam  Constitutional: She is oriented to person, place, and time. She  appears well-developed and well-nourished.  Acutely ill  HENT:  Head: Normocephalic and atraumatic.  Cardiovascular: Normal rate, regular rhythm and normal heart sounds.  Exam reveals no gallop and no friction rub.   No murmur heard. Pulmonary/Chest: Effort normal and breath sounds normal. No respiratory distress. She has no wheezes. She has no rales.  Musculoskeletal: Normal range of motion.  Low back discomfort  Lymphadenopathy:    She has cervical adenopathy.  Neurological: She is alert and oriented to person, place, and time.  Skin: Skin is warm and dry.  Psychiatric: She has a normal mood and affect. Her behavior is normal. Judgment and thought content normal.          Assessment & Plan:

## 2013-11-24 NOTE — Assessment & Plan Note (Signed)
Cough, fever of 101 over the weekend. Body aches. Flu

## 2013-11-24 NOTE — Assessment & Plan Note (Signed)
UA shows possible UTI. Send for culture. Start Cipro 250 mg bid x 5 days.

## 2013-11-24 NOTE — Assessment & Plan Note (Addendum)
Positive for influenza. Within window to treat with Tamiflu. Start 75 mg bid x 5 days. Fluids to maintain hydration. Tylenol or ibuprofen for fever. She was with her grandchildren yesterday. Recommend she notify their parents for follow up with their Pediatrician. Also her husband should be treated for household exposure. She will have him contact his physician. RTC if symptoms do not improve within 1 week or sooner if necessary.

## 2013-11-24 NOTE — Patient Instructions (Signed)
  Start, Tamiflu 75 mg twice a day for 5 days.  Also start Cipro 250 mg twice a day for 5 days.  Robitussin-AC for severe cough. Medication contains codeine and may cause sedation.  Drink plenty of fluids to maintain hydration.

## 2013-11-24 NOTE — Progress Notes (Signed)
Pre visit review using our clinic review tool, if applicable. No additional management support is needed unless otherwise documented below in the visit note. 

## 2013-12-01 ENCOUNTER — Telehealth: Payer: Self-pay | Admitting: Internal Medicine

## 2013-12-01 MED ORDER — CIPROFLOXACIN HCL 500 MG PO TABS: 500.0000 mg | ORAL_TABLET | Freq: Two times a day (BID) | ORAL | Status: DC

## 2013-12-01 NOTE — Telephone Encounter (Signed)
Pt states that she had stopped the Septra & feels that she has been dealing with this for  A whole week without adequate treatment. She states that she saw Raquel & was given Cipro 250mg  BID which was only half the strength she usually gets. She has always taken 500mg  bid & states that it should be in her history. Pt also states that she gave a urine specimen yesterday at [facility] acute care which confirmed that she still has bacteria in her urine. Pt would rather get the proper dose of Cipro instead of coming in tomorrow. She does not feel that a 3rd appt in necessary. Please advise

## 2013-12-01 NOTE — Telephone Encounter (Signed)
Please advise 

## 2013-12-01 NOTE — Telephone Encounter (Signed)
Spoke to pt. She is feeling better.  Relates the symptoms to taking the septra.  Will change back to cipro 500mg  bid.  Spoke to acute care.  They will send her urine for culture.  Will send records.  Pt to call with update over the next few days.

## 2013-12-01 NOTE — Telephone Encounter (Signed)
Patient Information:  Caller Name: Crystal Lynch  Phone: [phone]  Patient: Crystal Lynch  Gender: Female  DOB: 21-Jul-1941  Age: 73 Years  PCP: Einar Pheasant  Office Follow Up:  Does the office need to follow up with this patient?: Yes  Instructions For The Office: Patient wants to know should she continue the Septra ? she has had 3 doses.  "Nervous and shaky on inside".  Seeking guidance from Dr. Nicki Reaper. Just completed x5 days of Cipro 250mg  po .  Unable to locate Culture from 11/24/13  RN Note:  Patient wants to know should she continue the Septra ? she has had 3 doses.  "Nervous and shaky on inside".  Seeking guidance from Dr. Nicki Reaper. Just completed x5 days of Cipro 250mg  po .  Unable to locate Culture from 11/24/13  Symptoms  Reason For Call & Symptoms: Patinet states recently in office on 11/24/13  for flu and UTI. She was prescribed Cipro 250mg  bid x5 days and she states this was not enough. Onset Saturday 11/30/13 urinated blood and was seen at [facility] at given Septra 800-160 one bid x7 days. She states U/A showed infection and told her 250mg  did not clear the urine. "Patient stats she feels nauseated, lightheaded and nervous on the inside " after taking Septra. Seeking guidance from Dr. Nicki Reaper on what to do? No pain with urination, no burning, no fever, no back or abdominal pain  Reviewed Health History In EMR: Yes  Reviewed Medications In EMR: Yes  Reviewed Allergies In EMR: Yes  Reviewed Surgeries / Procedures: Yes  Date of Onset of Symptoms: 11/29/2013  Treatments Tried: Cipro 250mg  bid x5 days Septra  Treatments Tried Worked: No  Guideline(s) Used:  Urination Pain - Female  Disposition Per Guideline:   See Today in Office  Reason For Disposition Reached:   > 2 UTIs in last year  Advice Given:  Fluids:   Drink extra fluids. Drink 8-10 glasses of liquids a day (Reason: to produce a dilute, non-irritating urine).  Cranberry Juice:   Some people think that drinking cranberry juice may  help in fighting urinary tract infections. However, there is no good research that has ever proved this.  Call Back If:  You become worse.  RN Overrode Recommendation:  Document Patient  Patient wants to know should she continue the Septra ? she has had 3 doses.  "Nervous and shaky on inside".  Seeking guidance from Dr. Nicki Reaper. Just completed x5 days of Cipro 250mg  po .  Unable to locate Culture from 11/24/13

## 2013-12-01 NOTE — Telephone Encounter (Signed)
Please notify pt to stop the septra.  I can work her in during lunch tomorrow (come at 11:45).  May have to wait.  Obtain a urine sample prior to me seeing her.

## 2013-12-04 ENCOUNTER — Telehealth: Payer: Self-pay | Admitting: *Deleted

## 2013-12-04 NOTE — Telephone Encounter (Signed)
Was seen in recently & treated with Cipro 500 mg. Received a call yesterday from Urgent Care that she went to over the weekend letting her know that the Cipro was resistant & she needed to switch to Macrobid 100 bid x 7 days. Pt wants to know why we never sent a culture & figure this out the first time she came in. She feels that because of her hx, we should always run a culture on her urine. I made it clear to patient the a urine culture was ordered but not collected on 11/24/13, probably because there was not enough urine left to test. I also informed patient that a urine culture is standard is urinalysis presents any sign of infection. I also reminded patient that she asked for the Cipro both times, & Dr. Nicki Reaper called over to Urgent Care & had them run a culture on her urine. That's how she was able to find out that the Cipro would not work. Pt wanted to be sure that I sent the above information to you. A copy of culture results were received today & placed in your folder.

## 2013-12-05 NOTE — Telephone Encounter (Signed)
Called pt.  Left message with husband to check and see how pt doing.

## 2013-12-08 ENCOUNTER — Other Ambulatory Visit: Payer: Self-pay | Admitting: Internal Medicine

## 2013-12-08 ENCOUNTER — Encounter: Payer: Self-pay | Admitting: *Deleted

## 2013-12-08 DIAGNOSIS — R319 Hematuria, unspecified: Secondary | ICD-10-CM

## 2013-12-08 NOTE — Progress Notes (Signed)
Order placed for f/u labs.  

## 2013-12-10 ENCOUNTER — Other Ambulatory Visit: Payer: Self-pay | Admitting: *Deleted

## 2013-12-10 MED ORDER — OMEPRAZOLE 20 MG PO CPDR: 20.0000 mg | DELAYED_RELEASE_CAPSULE | Freq: Every day | ORAL | Status: DC

## 2013-12-17 ENCOUNTER — Other Ambulatory Visit (INDEPENDENT_AMBULATORY_CARE_PROVIDER_SITE_OTHER): Payer: 59

## 2013-12-17 DIAGNOSIS — R319 Hematuria, unspecified: Secondary | ICD-10-CM

## 2013-12-17 LAB — URINALYSIS, ROUTINE W REFLEX MICROSCOPIC
Bilirubin Urine: NEGATIVE
Ketones, ur: NEGATIVE
Leukocytes, UA: NEGATIVE
Nitrite: NEGATIVE
SPECIFIC GRAVITY, URINE: 1.025 (ref 1.000–1.030)
Total Protein, Urine: NEGATIVE
URINE GLUCOSE: NEGATIVE
Urobilinogen, UA: 0.2 (ref 0.0–1.0)
pH: 5.5 (ref 5.0–8.0)

## 2013-12-19 ENCOUNTER — Telehealth: Payer: Self-pay | Admitting: Internal Medicine

## 2013-12-19 ENCOUNTER — Other Ambulatory Visit: Payer: Self-pay | Admitting: Internal Medicine

## 2013-12-19 DIAGNOSIS — R319 Hematuria, unspecified: Secondary | ICD-10-CM

## 2013-12-19 LAB — CULTURE, URINE COMPREHENSIVE: Colony Count: 7000

## 2013-12-19 MED ORDER — NITROFURANTOIN MONOHYD MACRO 100 MG PO CAPS: 100.0000 mg | ORAL_CAPSULE | Freq: Two times a day (BID) | ORAL | Status: DC

## 2013-12-19 NOTE — Progress Notes (Signed)
Order placed for f/u urine and culture.  

## 2013-12-19 NOTE — Telephone Encounter (Signed)
Pt notified of urine culture results and need for macrobid.  rx sent in.  Pt to take align.

## 2013-12-25 LAB — HM DEXA SCAN

## 2013-12-30 ENCOUNTER — Encounter: Payer: Self-pay | Admitting: Internal Medicine

## 2013-12-31 ENCOUNTER — Encounter: Payer: Self-pay | Admitting: *Deleted

## 2014-01-14 ENCOUNTER — Other Ambulatory Visit (INDEPENDENT_AMBULATORY_CARE_PROVIDER_SITE_OTHER): Payer: 59

## 2014-01-14 DIAGNOSIS — R319 Hematuria, unspecified: Secondary | ICD-10-CM

## 2014-01-14 LAB — URINALYSIS, ROUTINE W REFLEX MICROSCOPIC
Bilirubin Urine: NEGATIVE
Ketones, ur: NEGATIVE
LEUKOCYTES UA: NEGATIVE
Nitrite: NEGATIVE
SPECIFIC GRAVITY, URINE: 1.01 (ref 1.000–1.030)
Total Protein, Urine: NEGATIVE
Urine Glucose: NEGATIVE
Urobilinogen, UA: 0.2 (ref 0.0–1.0)
WBC, UA: NONE SEEN (ref 0–?)
pH: 5.5 (ref 5.0–8.0)

## 2014-01-15 ENCOUNTER — Encounter: Payer: Self-pay | Admitting: Internal Medicine

## 2014-01-15 LAB — CULTURE, URINE COMPREHENSIVE
Colony Count: NO GROWTH
Organism ID, Bacteria: NO GROWTH

## 2014-01-16 ENCOUNTER — Encounter: Payer: Self-pay | Admitting: Internal Medicine

## 2014-02-27 ENCOUNTER — Other Ambulatory Visit: Payer: Self-pay | Admitting: Internal Medicine

## 2014-03-06 ENCOUNTER — Other Ambulatory Visit: Payer: Self-pay | Admitting: Internal Medicine

## 2014-04-11 ENCOUNTER — Other Ambulatory Visit: Payer: Self-pay | Admitting: Internal Medicine

## 2014-05-16 ENCOUNTER — Other Ambulatory Visit: Payer: Self-pay | Admitting: Internal Medicine

## 2014-06-04 ENCOUNTER — Other Ambulatory Visit: Payer: Self-pay | Admitting: Internal Medicine

## 2014-06-05 ENCOUNTER — Other Ambulatory Visit: Payer: Self-pay | Admitting: *Deleted

## 2014-06-05 ENCOUNTER — Encounter: Payer: Self-pay | Admitting: *Deleted

## 2014-06-05 MED ORDER — OMEPRAZOLE 20 MG PO CPDR: DELAYED_RELEASE_CAPSULE | ORAL | Status: DC

## 2014-06-05 MED ORDER — LISINOPRIL-HYDROCHLOROTHIAZIDE 20-25 MG PO TABS: ORAL_TABLET | ORAL | Status: DC

## 2014-08-03 ENCOUNTER — Other Ambulatory Visit: Payer: Self-pay | Admitting: Internal Medicine

## 2014-08-04 ENCOUNTER — Encounter: Payer: Self-pay | Admitting: Internal Medicine

## 2014-08-04 ENCOUNTER — Ambulatory Visit (INDEPENDENT_AMBULATORY_CARE_PROVIDER_SITE_OTHER): Payer: 59 | Admitting: Internal Medicine

## 2014-08-04 VITALS — BP 130/80 | HR 75 | Temp 98.1°F | Ht 62.25 in | Wt 138.5 lb

## 2014-08-04 DIAGNOSIS — E7800 Pure hypercholesterolemia, unspecified: Secondary | ICD-10-CM

## 2014-08-04 DIAGNOSIS — R319 Hematuria, unspecified: Secondary | ICD-10-CM

## 2014-08-04 DIAGNOSIS — I10 Essential (primary) hypertension: Secondary | ICD-10-CM

## 2014-08-04 DIAGNOSIS — E039 Hypothyroidism, unspecified: Secondary | ICD-10-CM

## 2014-08-04 DIAGNOSIS — K209 Esophagitis, unspecified without bleeding: Secondary | ICD-10-CM

## 2014-08-04 DIAGNOSIS — M8580 Other specified disorders of bone density and structure, unspecified site: Secondary | ICD-10-CM

## 2014-08-04 DIAGNOSIS — Z23 Encounter for immunization: Secondary | ICD-10-CM

## 2014-08-04 NOTE — Progress Notes (Signed)
Pre visit review using our clinic review tool, if applicable. No additional management support is needed unless otherwise documented below in the visit note. 

## 2014-08-05 ENCOUNTER — Telehealth: Payer: Self-pay | Admitting: *Deleted

## 2014-08-05 DIAGNOSIS — E039 Hypothyroidism, unspecified: Secondary | ICD-10-CM

## 2014-08-05 DIAGNOSIS — E7800 Pure hypercholesterolemia, unspecified: Secondary | ICD-10-CM

## 2014-08-05 DIAGNOSIS — I10 Essential (primary) hypertension: Secondary | ICD-10-CM

## 2014-08-05 NOTE — Telephone Encounter (Signed)
Pt is coming in tomorrow what labs and dx?  

## 2014-08-05 NOTE — Telephone Encounter (Signed)
Order placed for labs.

## 2014-08-06 ENCOUNTER — Other Ambulatory Visit (INDEPENDENT_AMBULATORY_CARE_PROVIDER_SITE_OTHER): Payer: 59

## 2014-08-06 DIAGNOSIS — E7800 Pure hypercholesterolemia, unspecified: Secondary | ICD-10-CM

## 2014-08-06 DIAGNOSIS — E039 Hypothyroidism, unspecified: Secondary | ICD-10-CM

## 2014-08-06 DIAGNOSIS — I10 Essential (primary) hypertension: Secondary | ICD-10-CM

## 2014-08-06 LAB — BASIC METABOLIC PANEL
BUN: 18 mg/dL (ref 6–23)
CO2: 30 meq/L (ref 19–32)
Calcium: 9.9 mg/dL (ref 8.4–10.5)
Chloride: 102 mEq/L (ref 96–112)
Creatinine, Ser: 0.8 mg/dL (ref 0.4–1.2)
GFR: 70.32 mL/min (ref >60.00)
GLUCOSE: 93 mg/dL (ref 70–99)
POTASSIUM: 4 meq/L (ref 3.5–5.1)
Sodium: 137 mEq/L (ref 135–145)

## 2014-08-06 LAB — LIPID PANEL
Cholesterol: 173 mg/dL (ref 0–200)
HDL: 38.8 mg/dL — ABNORMAL LOW (ref >39.00)
LDL CALC: 102 mg/dL — AB (ref 0–99)
NONHDL: 134.2
Total CHOL/HDL Ratio: 4
Triglycerides: 160 mg/dL — ABNORMAL HIGH (ref 0.0–149.0)
VLDL: 32 mg/dL (ref 0.0–40.0)

## 2014-08-06 LAB — HEPATIC FUNCTION PANEL
ALT: 16 U/L (ref 0–35)
AST: 21 U/L (ref 0–37)
Albumin: 3.6 g/dL (ref 3.5–5.2)
Alkaline Phosphatase: 56 U/L (ref 39–117)
BILIRUBIN TOTAL: 1.4 mg/dL — AB (ref 0.2–1.2)
Bilirubin, Direct: 0.2 mg/dL (ref 0.0–0.3)
Total Protein: 6.9 g/dL (ref 6.0–8.3)

## 2014-08-06 LAB — TSH: TSH: 1.16 u[IU]/mL (ref 0.35–4.50)

## 2014-08-07 ENCOUNTER — Encounter: Payer: Self-pay | Admitting: Internal Medicine

## 2014-08-10 ENCOUNTER — Encounter: Payer: Self-pay | Admitting: Internal Medicine

## 2014-08-10 DIAGNOSIS — M8580 Other specified disorders of bone density and structure, unspecified site: Secondary | ICD-10-CM | POA: Insufficient documentation

## 2014-08-10 NOTE — Assessment & Plan Note (Signed)
Symptoms controlled on omeprazole.   

## 2014-08-10 NOTE — Assessment & Plan Note (Signed)
On thyroid replacement.  Follow tsh.  Last tsh wnl.

## 2014-08-10 NOTE — Progress Notes (Signed)
Subjective:    Patient ID: Crystal Lynch, female    DOB: Dec 05, 1940, 73 y.o.   MRN: [id number]  HPI 73 year old female with past history of hypertension, hypothyroidism and hypercholesterolemia who comes in today for a scheduled follow up.  She states she has been doing well.  No cardiac symptoms with increased activity or exertion.  Breathing stable.  No acid reflux.  Blood pressures averaging - 120-140/70-80.    No urine or bowel change. Has been released by Dr Enzo Bi.  Occasionally will notice minimal right lower quadrant discomfort.  Rarely occurs.  Nothing persistent.   No vaginal problems.  Overall she feels she is doing well.    Past Medical History  Diagnosis Date  . Hypertension   . Hypercholesterolemia   . Esophagitis     s/p esophageal dilatation  . Hypothyroidism   . Endometriosis   . History of ovarian cyst   . Arthritis   . GERD (gastroesophageal reflux disease)   . Chicken pox     Current Outpatient Prescriptions on File Prior to Visit  Medication Sig Dispense Refill  . aspirin 81 MG tablet Take 81 mg by mouth daily.      . Calcium Carbonate-Vitamin D (CALTRATE 600+D) 600-400 MG-UNIT per tablet Take 1 tablet by mouth 2 (two) times daily.      . Glucosamine-Chondroitin ([employer]S GLUCOSAMINE-CHONDROITIN) 750-600 MG TABS Take 600 mg by mouth 2 (two) times daily.      Marland Kitchen KLOR-CON M20 20 MEQ tablet TAKE 1 TABLET DAILY  90 tablet  1  . levothyroxine (SYNTHROID, LEVOTHROID) 88 MCG tablet TAKE 1 TABLET DAILY  90 tablet  2  . lisinopril-hydrochlorothiazide (PRINZIDE,ZESTORETIC) 20-25 MG per tablet TAKE 1 TABLET DAILY  90 tablet  0  . Multiple Vitamin (MULTIVITAMIN) tablet Take 1 tablet by mouth daily.      Marland Kitchen omeprazole (PRILOSEC) 20 MG capsule TAKE 1 CAPSULE DAILY  90 capsule  0  . simvastatin (ZOCOR) 10 MG tablet TAKE 1 TABLET AT BEDTIME  90 tablet  0   No current facility-administered medications on file prior to visit.    Review of Systems Patient denies any headache,  lightheadedness or dizziness.  No significant sinus or allergy symptoms.   No chest pain, tightness or palpitations.  No increased shortness of breath, cough or congestion.  No nausea or vomiting.  No acid reflux.  No abdominal pain or cramping.  No bowel change, such as diarrhea, constipation, BRBPR or melana.   No dysuria.  No vaginal problems.        Objective:   Physical Exam  Filed Vitals:   08/04/14 1117  BP: 130/80  Pulse: 75  Temp: 98.1 F (36.7 C)   Blood pressure recheck:  106/20  73 year old female in no acute distress.   HEENT:  Nares- clear.  Oropharynx - without lesions. NECK:  Supple.  Nontender.  No audible bruit.  HEART:  Appears to be regular. LUNGS:  No crackles or wheezing audible.  Respirations even and unlabored.  RADIAL PULSE:  Equal bilaterally.  ABDOMEN:  Soft, nontender.  Bowel sounds present and normal.  No audible abdominal bruit.   EXTREMITIES:  No increased edema present.         Assessment & Plan:  FAMILY HISTORY OF BREAST CANCER.  Have discussed with her regarding genetic testing.  Mammogram 11/20/13 - Birads I.    PREVIOUS PELVIC PAIN.  Has been released by Dr Enzo Bi.  Doing well.  Stable.  HEALTH MAINTENANCE.  Physical 10/27/13.   Last mammogram 11/20/13 - Birads I.  Colonoscopy 09/05/12.  12/25/13 bone density - osteopenia.  No significant change from last.   Problem List Items Addressed This Visit   Esophagitis     Symptoms controlled on omeprazole.       Hematuria     Urinalysis 3/15 - no rbc's.       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  Follow.  Last bilirubin wnl. Recheck with next labs.      Hypercholesterolemia     Low cholesterol diet and exercise.  On simvastatin.  Check lipid panel and liver function.       Hypertension     Blood pressure under reasonable control.  Same medication. Follow metabolic panel.      Hypothyroidism     On thyroid replacement.  Follow tsh.  Last tsh wnl.      Osteopenia      Bone density just checked 12/25/13.  Osteopenia.  No significant change.  Continue weight bearing exercise.  Follow.        Other Visit Diagnoses   Need for prophylactic vaccination against Streptococcus pneumoniae (pneumococcus)    -  Primary    Relevant Orders       Pneumococcal conjugate vaccine 13-valent (Completed)

## 2014-08-10 NOTE — Telephone Encounter (Signed)
Unread mychart message mailed to patient 

## 2014-08-10 NOTE — Assessment & Plan Note (Signed)
Bone density just checked 12/25/13.  Osteopenia.  No significant change.  Continue weight bearing exercise.  Follow.

## 2014-08-10 NOTE — Assessment & Plan Note (Signed)
Low cholesterol diet and exercise.  On simvastatin.  Check lipid panel and liver function.

## 2014-08-10 NOTE — Assessment & Plan Note (Signed)
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  Follow.  Last bilirubin wnl. Recheck with next labs.

## 2014-08-10 NOTE — Assessment & Plan Note (Signed)
Urinalysis 3/15 - no rbc's.

## 2014-08-10 NOTE — Assessment & Plan Note (Signed)
Blood pressure under reasonable control.  Same medication. Follow metabolic panel.

## 2014-09-02 ENCOUNTER — Other Ambulatory Visit: Payer: Self-pay | Admitting: Internal Medicine

## 2014-09-17 ENCOUNTER — Other Ambulatory Visit: Payer: Self-pay | Admitting: Internal Medicine

## 2014-10-22 ENCOUNTER — Other Ambulatory Visit: Payer: Self-pay | Admitting: Internal Medicine

## 2014-10-27 ENCOUNTER — Encounter: Payer: 59 | Admitting: Internal Medicine

## 2014-11-01 ENCOUNTER — Other Ambulatory Visit: Payer: Self-pay | Admitting: Internal Medicine

## 2014-11-02 NOTE — Telephone Encounter (Signed)
Refill sent electronically

## 2014-11-09 ENCOUNTER — Encounter: Payer: Self-pay | Admitting: Internal Medicine

## 2014-11-09 ENCOUNTER — Ambulatory Visit (INDEPENDENT_AMBULATORY_CARE_PROVIDER_SITE_OTHER): Payer: Medicare Other | Admitting: Internal Medicine

## 2014-11-09 VITALS — BP 140/80 | HR 71 | Temp 98.6°F | Ht 62.75 in | Wt 137.5 lb

## 2014-11-09 DIAGNOSIS — M8580 Other specified disorders of bone density and structure, unspecified site: Secondary | ICD-10-CM

## 2014-11-09 DIAGNOSIS — I10 Essential (primary) hypertension: Secondary | ICD-10-CM

## 2014-11-09 DIAGNOSIS — E7800 Pure hypercholesterolemia, unspecified: Secondary | ICD-10-CM

## 2014-11-09 DIAGNOSIS — E039 Hypothyroidism, unspecified: Secondary | ICD-10-CM

## 2014-11-09 DIAGNOSIS — R319 Hematuria, unspecified: Secondary | ICD-10-CM

## 2014-11-09 DIAGNOSIS — Z1239 Encounter for other screening for malignant neoplasm of breast: Secondary | ICD-10-CM

## 2014-11-09 DIAGNOSIS — K209 Esophagitis, unspecified without bleeding: Secondary | ICD-10-CM

## 2014-11-09 LAB — HEPATIC FUNCTION PANEL
ALK PHOS: 59 U/L (ref 39–117)
ALT: 41 U/L — AB (ref 0–35)
AST: 21 U/L (ref 0–37)
Albumin: 4.1 g/dL (ref 3.5–5.2)
BILIRUBIN TOTAL: 1 mg/dL (ref 0.2–1.2)
Bilirubin, Direct: 0.2 mg/dL (ref 0.0–0.3)
Total Protein: 6.6 g/dL (ref 6.0–8.3)

## 2014-11-09 LAB — VITAMIN D 25 HYDROXY (VIT D DEFICIENCY, FRACTURES): VITD: 47.26 ng/mL (ref 30.00–100.00)

## 2014-11-09 LAB — BASIC METABOLIC PANEL
BUN: 14 mg/dL (ref 6–23)
CALCIUM: 10.2 mg/dL (ref 8.4–10.5)
CO2: 29 meq/L (ref 19–32)
CREATININE: 0.78 mg/dL (ref 0.40–1.20)
Chloride: 100 mEq/L (ref 96–112)
GFR: 76.54 mL/min (ref >60.00)
GLUCOSE: 99 mg/dL (ref 70–99)
Potassium: 4 mEq/L (ref 3.5–5.1)
SODIUM: 136 meq/L (ref 135–145)

## 2014-11-09 LAB — LIPID PANEL
CHOLESTEROL: 164 mg/dL (ref 0–200)
HDL: 49.2 mg/dL (ref >39.00)
LDL CALC: 91 mg/dL (ref 0–99)
NonHDL: 114.8
TRIGLYCERIDES: 121 mg/dL (ref 0.0–149.0)
Total CHOL/HDL Ratio: 3
VLDL: 24.2 mg/dL (ref 0.0–40.0)

## 2014-11-09 LAB — TSH: TSH: 1.42 u[IU]/mL (ref 0.35–4.50)

## 2014-11-09 LAB — CBC WITH DIFFERENTIAL/PLATELET
BASOS PCT: 0.4 % (ref 0.0–3.0)
Basophils Absolute: 0 10*3/uL (ref 0.0–0.1)
Eosinophils Absolute: 0.1 10*3/uL (ref 0.0–0.7)
Eosinophils Relative: 2.3 % (ref 0.0–5.0)
HEMATOCRIT: 44.1 % (ref 36.0–46.0)
HEMOGLOBIN: 14.6 g/dL (ref 12.0–15.0)
Lymphocytes Relative: 32 % (ref 12.0–46.0)
Lymphs Abs: 1.9 10*3/uL (ref 0.7–4.0)
MCHC: 33.1 g/dL (ref 30.0–36.0)
MCV: 89.2 fl (ref 78.0–100.0)
MONO ABS: 0.5 10*3/uL (ref 0.1–1.0)
MONOS PCT: 8 % (ref 3.0–12.0)
Neutro Abs: 3.4 10*3/uL (ref 1.4–7.7)
Neutrophils Relative %: 57.3 % (ref 43.0–77.0)
Platelets: 246 10*3/uL (ref 150.0–400.0)
RBC: 4.94 Mil/uL (ref 3.87–5.11)
RDW: 13.1 % (ref 11.5–15.5)
WBC: 5.9 10*3/uL (ref 4.0–10.5)

## 2014-11-09 NOTE — Progress Notes (Signed)
Pre visit review using our clinic review tool, if applicable. No additional management support is needed unless otherwise documented below in the visit note. 

## 2014-11-09 NOTE — Progress Notes (Signed)
Subjective:    Patient ID: Crystal Lynch, female    DOB: 07-27-1941, 74 y.o.   MRN: [id number]  HPI 74 year old female with past history of hypertension, hypothyroidism and hypercholesterolemia who comes in today to follow up on these issues as well as for a complete physical exam.  She states she has been doing well.  No cardiac symptoms with increased activity or exertion.  Breathing stable.  No acid reflux.  Blood pressures stable.  Brought in no recorded bp readings.    No urine or bowel change. Has been released by Dr Enzo Bi.  No vaginal problems.  Overall she feels she is doing well.  Husband has been in the hospital recently.  Doing better.     Past Medical History  Diagnosis Date  . Hypertension   . Hypercholesterolemia   . Esophagitis     s/p esophageal dilatation  . Hypothyroidism   . Endometriosis   . History of ovarian cyst   . Arthritis   . GERD (gastroesophageal reflux disease)   . Chicken pox     Current Outpatient Prescriptions on File Prior to Visit  Medication Sig Dispense Refill  . aspirin 81 MG tablet Take 81 mg by mouth daily.    . Calcium Carbonate-Vitamin D (CALTRATE 600+D) 600-400 MG-UNIT per tablet Take 1 tablet by mouth 2 (two) times daily.    . Glucosamine-Chondroitin (CVS GLUCOSAMINE-CHONDROITIN) 750-600 MG TABS Take 600 mg by mouth 2 (two) times daily.    Marland Kitchen levothyroxine (SYNTHROID, LEVOTHROID) 88 MCG tablet TAKE 1 TABLET DAILY 90 tablet 1  . lisinopril-hydrochlorothiazide (PRINZIDE,ZESTORETIC) 20-25 MG per tablet Take 1 tablet by mouth daily. 90 tablet 1  . Multiple Vitamin (MULTIVITAMIN) tablet Take 1 tablet by mouth daily.    Marland Kitchen omeprazole (PRILOSEC) 20 MG capsule TAKE 1 CAPSULE DAILY 90 capsule 1  . potassium chloride SA (K-DUR,KLOR-CON) 20 MEQ tablet TAKE 1 TABLET DAILY 90 tablet 1  . simvastatin (ZOCOR) 10 MG tablet TAKE 1 TABLET AT BEDTIME 90 tablet 1   No current facility-administered medications on file prior to visit.    Review of  Systems Patient denies any headache, lightheadedness or dizziness.  No significant sinus or allergy symptoms.   No chest pain, tightness or palpitations.  No increased shortness of breath, cough or congestion.  No nausea or vomiting.  No acid reflux.  No abdominal pain or cramping.  No bowel change, such as diarrhea, constipation, BRBPR or melana.   No dysuria.  No vaginal problems.   Handling stress well.  Husband has been in the hospital recently.  He is doing better.       Objective:   Physical Exam  Filed Vitals:   11/09/14 0805  BP: 140/80  Pulse: 71  Temp: 98.6 F (37 C)   Blood pressure recheck:  29/61  74 year old female in no acute distress.   HEENT:  Nares- clear.  Oropharynx - without lesions. NECK:  Supple.  Nontender.  No audible bruit.  HEART:  Appears to be regular. LUNGS:  No crackles or wheezing audible.  Respirations even and unlabored.  RADIAL PULSE:  Equal bilaterally.    BREASTS:  No nipple discharge or nipple retraction present.  Could not appreciate any distinct nodules or axillary adenopathy.  ABDOMEN:  Soft, nontender.  Bowel sounds present and normal.  No audible abdominal bruit.  GU:  Not performed.  Marland Kitchen   EXTREMITIES:  No increased edema present.  DP pulses palpable and equal bilaterally.  FEET:  No lesions.      Assessment & Plan:  1. Breast cancer screening - MM DIGITAL SCREENING BILATERAL; Future  2. Essential hypertension Blood pressure as outlined.  Same medication regimen.  Follow pressures.   - Basic metabolic panel  3. Esophagitis Symptoms controlled on prilosec.    4. Hypothyroidism, unspecified hypothyroidism type On thyroid replacement.  Follow tsh.   - TSH  5. Osteopenia Bone density 12/25/13 - osteopenia.  No significant change.   - Vit D  25 hydroxy (rtn osteoporosis monitoring)  6. Hematuria 01/14/14 urinalysis with no rbc's.   - CBC with Differential  7. Hyperbilirubinemia - Hepatic function panel  8.  Hypercholesterolemia Low cholesterol diet and exercise.  On simvastatin.   - Lipid panel  9. FAMILY HISTORY OF BREAST CANCER.  Have discussed with her regarding genetic testing.  Mammogram 11/20/13 - Birads I. Schedule f/u mammogram.     10. PREVIOUS PELVIC PAIN.  Has been released by Dr Enzo Bi. Doing well.  Stable.   HEALTH MAINTENANCE.  Physical today.   Last mammogram 11/20/13 - Birads I.  Schedule f/u mammogram.  Colonoscopy 09/05/12.  12/25/13 bone density - osteopenia.  No significant change from last.   I spent 25 minutes with the patient and more than 50% of the time was spent in consultation regarding the above.

## 2014-11-10 ENCOUNTER — Encounter: Payer: Self-pay | Admitting: Internal Medicine

## 2014-11-10 ENCOUNTER — Telehealth: Payer: Self-pay | Admitting: Internal Medicine

## 2014-11-10 DIAGNOSIS — R7989 Other specified abnormal findings of blood chemistry: Secondary | ICD-10-CM

## 2014-11-10 DIAGNOSIS — R945 Abnormal results of liver function studies: Secondary | ICD-10-CM

## 2014-11-10 NOTE — Telephone Encounter (Signed)
Pt notified of lab results via my chart.  Needs a non fasting lab appt in 7-10 days.  Please schedule and contact her with a lab appt date and time.  Thanks.

## 2014-11-12 NOTE — Telephone Encounter (Signed)
Unread mychart message mailed to patient 

## 2014-11-16 ENCOUNTER — Other Ambulatory Visit (INDEPENDENT_AMBULATORY_CARE_PROVIDER_SITE_OTHER): Payer: Medicare Other

## 2014-11-16 ENCOUNTER — Encounter: Payer: Self-pay | Admitting: Internal Medicine

## 2014-11-16 DIAGNOSIS — R7989 Other specified abnormal findings of blood chemistry: Secondary | ICD-10-CM

## 2014-11-16 DIAGNOSIS — R945 Abnormal results of liver function studies: Secondary | ICD-10-CM

## 2014-11-16 LAB — HEPATIC FUNCTION PANEL
ALBUMIN: 4.3 g/dL (ref 3.5–5.2)
ALT: 16 U/L (ref 0–35)
AST: 19 U/L (ref 0–37)
Alkaline Phosphatase: 68 U/L (ref 39–117)
Bilirubin, Direct: 0.1 mg/dL (ref 0.0–0.3)
Total Bilirubin: 1 mg/dL (ref 0.2–1.2)
Total Protein: 6.6 g/dL (ref 6.0–8.3)

## 2014-11-23 NOTE — Telephone Encounter (Signed)
Unread mychart message mailed to patient 

## 2014-11-26 ENCOUNTER — Ambulatory Visit: Payer: Self-pay | Admitting: Internal Medicine

## 2014-11-26 LAB — HM MAMMOGRAPHY: HM Mammogram: NEGATIVE

## 2014-12-07 ENCOUNTER — Emergency Department: Payer: Self-pay | Admitting: Student

## 2014-12-08 ENCOUNTER — Encounter: Payer: Self-pay | Admitting: Nurse Practitioner

## 2014-12-08 ENCOUNTER — Ambulatory Visit (INDEPENDENT_AMBULATORY_CARE_PROVIDER_SITE_OTHER): Payer: Medicare Other | Admitting: Nurse Practitioner

## 2014-12-08 ENCOUNTER — Telehealth: Payer: Self-pay | Admitting: Internal Medicine

## 2014-12-08 VITALS — BP 148/80 | HR 75 | Temp 98.0°F | Resp 12 | Ht 62.75 in | Wt 139.4 lb

## 2014-12-08 DIAGNOSIS — I10 Essential (primary) hypertension: Secondary | ICD-10-CM

## 2014-12-08 MED ORDER — HYDROCHLOROTHIAZIDE 25 MG PO TABS: 25.0000 mg | ORAL_TABLET | Freq: Every day | ORAL | Status: DC

## 2014-12-08 MED ORDER — LISINOPRIL 30 MG PO TABS: 30.0000 mg | ORAL_TABLET | Freq: Every day | ORAL | Status: DC

## 2014-12-08 NOTE — Progress Notes (Signed)
Subjective:    Patient ID: Crystal Lynch, female    DOB: 06-22-1941, 74 y.o.   MRN: [id number]  HPI  Crystal Lynch is a 74 yo female with a CC of HTN and BP medication discussion.   1) Went to ER last night. Takes meds in morning. Over 200 when she went to ER. Was given fluids and a pill (unsure of name in the ER). Cooking soup yesterday, felt tired yesterday and had to sit down, felt nervous, took BP and it was elevated. She stayed in the ER for 2 hours. Took blood and urine and EKG was normal.   Range over 2 days- 162/79 to 197/102.   Pulse range over 2 days- 67-96                      BP Readings from Last 3 Encounters:  12/08/14 148/80  11/09/14 140/80  08/04/14 130/80   Still elevated this morning. Takes Lisinopril-HCTZ takes in morning and has been taking this way for awhile.  Watches salt intake.   Review of Systems  Constitutional: Negative for fever, chills, diaphoresis and fatigue.  Eyes: Negative for visual disturbance.  Respiratory: Negative for chest tightness, shortness of breath and wheezing.   Cardiovascular: Negative for chest pain, palpitations and leg swelling.  Gastrointestinal: Negative for nausea, vomiting and diarrhea.  Skin: Negative for rash.  Neurological: Negative for dizziness, weakness, numbness and headaches.  Psychiatric/Behavioral: The patient is nervous/anxious.    Past Medical History  Diagnosis Date  . Hypertension   . Hypercholesterolemia   . Esophagitis     s/p esophageal dilatation  . Hypothyroidism   . Endometriosis   . History of ovarian cyst   . Arthritis   . GERD (gastroesophageal reflux disease)   . Chicken pox     History   Social History  . Marital Status: Married    Spouse Name: N/A  . Number of Children: N/A  . Years of Education: N/A   Occupational History  . Not on file.   Social History Main Topics  . Smoking status: Never Smoker   . Smokeless tobacco: Never Used  . Alcohol Use: No  . Drug Use: No  .  Sexual Activity: Not on file   Other Topics Concern  . Not on file   Social History Narrative    Past Surgical History  Procedure Laterality Date  . Abdominal surgery      found to have an ovarian cyst and endometriosis  . Appendectomy      Family History  Problem Relation Age of Onset  . Lung disease Father   . Heart disease Mother     myocardial infarction  . Breast cancer Sister   . Breast cancer Mother   . Breast cancer Maternal Aunt   . Colon cancer Neg Hx     No Known Allergies  Current Outpatient Prescriptions on File Prior to Visit  Medication Sig Dispense Refill  . aspirin 81 MG tablet Take 81 mg by mouth daily.    . Calcium Carbonate-Vitamin D (CALTRATE 600+D) 600-400 MG-UNIT per tablet Take 1 tablet by mouth 2 (two) times daily.    . Glucosamine-Chondroitin (CVS GLUCOSAMINE-CHONDROITIN) 750-600 MG TABS Take 600 mg by mouth 2 (two) times daily.    Marland Kitchen levothyroxine (SYNTHROID, LEVOTHROID) 88 MCG tablet TAKE 1 TABLET DAILY 90 tablet 1  . Multiple Vitamin (MULTIVITAMIN) tablet Take 1 tablet by mouth daily.    Marland Kitchen omeprazole (PRILOSEC) 20 MG capsule  TAKE 1 CAPSULE DAILY 90 capsule 1  . potassium chloride SA (K-DUR,KLOR-CON) 20 MEQ tablet TAKE 1 TABLET DAILY 90 tablet 1  . simvastatin (ZOCOR) 10 MG tablet TAKE 1 TABLET AT BEDTIME 90 tablet 1   No current facility-administered medications on file prior to visit.      Objective:   Physical Exam  Constitutional: She is oriented to person, place, and time. She appears well-developed and well-nourished. No distress.  BP 148/80 mmHg  Pulse 75  Temp(Src) 98 F (36.7 C) (Oral)  Resp 12  Ht 5' 2.75" (1.594 m)  Wt 139 lb 6.4 oz (63.231 kg)  BMI 24.89 kg/m2  SpO2 98%  LMP 10/21/1984   HENT:  Head: Normocephalic and atraumatic.  Right Ear: External ear normal.  Left Ear: External ear normal.  Eyes: Conjunctivae and EOM are normal. Pupils are equal, round, and reactive to light. Right eye exhibits no discharge. Left  eye exhibits no discharge. No scleral icterus.  Cardiovascular: Normal rate, regular rhythm, normal heart sounds and intact distal pulses.  Exam reveals no gallop and no friction rub.   No murmur heard. Pulmonary/Chest: Effort normal and breath sounds normal. No respiratory distress. She has no wheezes. She has no rales. She exhibits no tenderness.  Musculoskeletal: Normal range of motion. She exhibits no edema or tenderness.  Neurological: She is alert and oriented to person, place, and time. No cranial nerve deficit. She exhibits normal muscle tone. Coordination normal.  Skin: Skin is warm and dry. No rash noted. She is not diaphoretic.  Psychiatric: She has a normal mood and affect. Her behavior is normal. Judgment and thought content normal.      Assessment & Plan:

## 2014-12-08 NOTE — Progress Notes (Signed)
Pre visit review using our clinic review tool, if applicable. No additional management support is needed unless otherwise documented below in the visit note. 

## 2014-12-08 NOTE — Telephone Encounter (Signed)
Pt scheduled to see you at 1:30 today.

## 2014-12-08 NOTE — Telephone Encounter (Signed)
Thanks

## 2014-12-08 NOTE — Assessment & Plan Note (Signed)
BP Readings from Last 3 Encounters:  12/08/14 148/80  11/09/14 140/80  08/04/14 130/80   Lab Results  Component Value Date   BUN 14 11/09/2014   Lab Results  Component Value Date   CREATININE 0.78 11/09/2014   Stop the Lisinopril HCTZ combo. Dr. Nicki Reaper and I discussed splitting into the individual medications and upping the lisinopril to the next dose. Start tomorrow- Lisinopril 30 mg daily with the HCTZ 25 mg daily advised she can take together. Fu in 1 month with Dr. Nicki Reaper.

## 2014-12-08 NOTE — Telephone Encounter (Signed)
Patient Name: Crystal Lynch  DOB: 04-06-1941    Initial Comment Caller went to the ER last night for high blood pressure. Was told to follow up with her physician this morning. Current BP is 180/99 with 88 pulse rate.    Nurse Assessment  Nurse: Mallie Mussel, RN, Alveta Heimlich Date/Time Eilene Ghazi Time): 12/08/2014 8:59:50 AM  Confirm and document reason for call. If symptomatic, describe symptoms. ---Caller states that she was in the ER last night for elevated BP. Her last reading was 180/99 with a pulse of 88. This reading was taken about an hour ago after eating. Current BP is 197/102 HR is 92.  Has the patient traveled out of the country within the last 30 days? ---No  Does the patient require triage? ---Yes  Related visit to physician within the last 2 weeks? ---No  Does the PT have any chronic conditions? (i.e. diabetes, asthma, etc.) ---Yes  List chronic conditions. ---HTN     Guidelines    Guideline Title Affirmed Question Affirmed Notes  High Blood Pressure BP ? 160/100    Final Disposition User   See PCP When Office is Open (within 3 days) Mallie Mussel, RN, Alveta Heimlich    Comments  Appointment scheduled at 1:30 with Lorane Gell NP

## 2014-12-08 NOTE — Patient Instructions (Signed)
Stop the combination pill Lisinopril-HCTZ and start the individual medications.  Lisinopril 30 mg and HCTZ 25 mg   You can take these in the morning at the same time.   Keep a log of your BP after these changes and follow up with Dr. Nicki Reaper in 1 month.

## 2014-12-10 ENCOUNTER — Telehealth: Payer: Self-pay

## 2014-12-10 ENCOUNTER — Telehealth: Payer: Self-pay | Admitting: Internal Medicine

## 2014-12-10 ENCOUNTER — Emergency Department: Payer: Self-pay | Admitting: Emergency Medicine

## 2014-12-10 NOTE — Telephone Encounter (Signed)
Patient Name: Crystal Lynch DOB: August 08, 1941 Initial Comment Caller states she started taking a new blood pressure medication and her blood pressure is still high and her most recent reading was 184/93. Nurse Assessment Nurse: Mechele Dawley, RN, Amy Date/Time Eilene Ghazi Time): 12/10/2014 5:03:13 PM Confirm and document reason for call. If symptomatic, describe symptoms. ---MONDAY NIGHT IT GOT REALLY HIGH. IT WAS 233/88 IN THE ED. THEY DID EKG AND DID WORK UP. SHE WAS GIVEN MEDICATION TO GET THE BP DOWN. SHE WENT IN ON TUESDAY AND SAW NP - SHE PUT HER ON NEW MEDICATION. SHE IS TAKING THE SAME BP MEDICATION WITH AN INCR LISINOPRIL FROM 20 TO 30 MG. SHE HAS GIVEN HCTZ 25 MG TOTAL. IT HAS NOT BROUGHT IT DOWN ANY. SHE TOOK THIS AT 1630 THIS AFTERNOON WITH THE READING OF 184/93, HR 76, 186/92 WAS HER READING THIS MORNING. THE AFTERNOON ON MONDAY SHE STATED THAT SHE HAD A GOOD DAY AND THEN SHE STARTED FEELING JERKY AND NERVOUS - IT WAS VERY HIGH. SHE DOES HAVE A BURNING IN HER CHEST AREA. Has the patient traveled out of the country within the last 30 days? ---Not Applicable Does the patient require triage? ---Yes Related visit to physician within the last 2 weeks? ---Yes Does the PT have any chronic conditions? (i.e. diabetes, asthma, etc.) ---Yes List chronic conditions. ---ACID REFLUX MEDICATION, HTN, THYROID MEDICATION, CHOLESTEROL Guidelines Guideline Title Affirmed Question Affirmed Notes High Blood Pressure [1] BP # 160 / 100 AND [2] cardiac or neurologic symptoms (e.g., chest pain, difficulty breathing, unsteady gait, blurred vision) Final Disposition User Go to ED Now [place], Therapist, sports, Amy

## 2014-12-10 NOTE — Telephone Encounter (Signed)
The patient is hoping to have a ref to [place] at [facility]  Pt callback - [phone]

## 2014-12-10 NOTE — Telephone Encounter (Signed)
emmi emailed °

## 2014-12-11 ENCOUNTER — Telehealth: Payer: Self-pay

## 2014-12-11 NOTE — Telephone Encounter (Signed)
L mom to schedule ED flup per Dr. Caryl Comes for HTN CP. Pt prefers Dr. Rockey Situ

## 2014-12-11 NOTE — Telephone Encounter (Signed)
See if pt in ER.

## 2014-12-11 NOTE — Telephone Encounter (Signed)
Tried to confirm whether patient went to the ED no answer received voicemail. Left message to call office.

## 2014-12-11 NOTE — Telephone Encounter (Addendum)
Patient returned call and stated she was started on labetalol 100 mg BID in addition to medication prescribed by Primary MD. Patient reports feeling much better today. I have updated med list.

## 2014-12-11 NOTE — Telephone Encounter (Signed)
Noted.  Pt has cardiology appt.  Does not need anything more at this time.

## 2014-12-11 NOTE — Telephone Encounter (Signed)
Patient not in ER, but Bonnita Nasuti talked with Gabriel Cirri at cardiology and patient evidently saw Dr. Jens Som at ED and follow up was placed to see Dr. Fletcher Anon, Cardiology is aware and has left message for patient to return their call.

## 2014-12-14 ENCOUNTER — Encounter: Payer: Self-pay | Admitting: Cardiovascular Disease

## 2014-12-14 ENCOUNTER — Ambulatory Visit (INDEPENDENT_AMBULATORY_CARE_PROVIDER_SITE_OTHER): Payer: Medicare Other | Admitting: Cardiovascular Disease

## 2014-12-14 VITALS — BP 120/68 | HR 68 | Ht 62.5 in | Wt 139.5 lb

## 2014-12-14 DIAGNOSIS — I10 Essential (primary) hypertension: Secondary | ICD-10-CM

## 2014-12-14 DIAGNOSIS — R0789 Other chest pain: Secondary | ICD-10-CM

## 2014-12-14 MED ORDER — LABETALOL HCL 100 MG PO TABS: 100.0000 mg | ORAL_TABLET | Freq: Two times a day (BID) | ORAL | Status: DC

## 2014-12-14 NOTE — Patient Instructions (Signed)
Continue same medications. You can take an additional dose of labetalol if systolic blood pressure is above 180 mm Hg.   Your physician wants you to follow-up in: 6 months.  You will receive a reminder letter in the mail two months in advance. If you don't receive a letter, please call our office to schedule the follow-up appointment.

## 2014-12-14 NOTE — Assessment & Plan Note (Signed)
I suspect that the patient has essential hypertension. She had recently elevated blood pressure readings partially triggered by stress and anxiety about the situation. Blood pressure improved significantly after adding labetalol. I think a beta blocker is a good choice in addition to lisinopril and hydrochlorothiazide. The dose of labetalol can be gradually increased if needed. I also advised her that she can use an additional dose if systolic blood pressure exceeds 180. For now, the blood pressure appears to be controlled. If blood pressure becomes elevated in spite of 3 maximum tolerated doses of antihypertensive medications including a diuretic, screening for secondary hypertension can be considered. However, this is not necessary at the present time. She has no cardiac symptoms overall and EKG is normal.

## 2014-12-14 NOTE — Progress Notes (Signed)
Primary care physician: Dr. Einar Pheasant  HPI  This is a pleasant 74 year old female who was referred for evaluation of hypertension. She has no previous cardiac history. She has prolonged history of hypertension which has been well-controlled with medications up until recently, hyperlipidemia and GERD. She usually monitors have blood pressure regularly. Overall, the readings have been well-controlled up until February 15 when she noted that her blood pressure was 170/88. The blood pressure continued to be high after that and she became very anxious. She saw Dr. Nicki Reaper who increased the dose of lisinopril from 20  to 30 mg. She had an emergency room visit before that on Monday and blood pressure was 124 systolic. The blood pressure continues to be elevated after increasing the lisinopril and she went to the emergency room again on Thursday. Labs were overall unremarkable except for mild hypokalemia. She is on hydrochlorothiazide. Labetalol was then added with subsequent improvement. She denies chest pain, shortness of breath or palpitations.   family history is remarkable for coronary artery disease but not prematurely.  No Known Allergies   Current Outpatient Prescriptions on File Prior to Visit  Medication Sig Dispense Refill  . aspirin 81 MG tablet Take 81 mg by mouth daily.    . Calcium Carbonate-Vitamin D (CALTRATE 600+D) 600-400 MG-UNIT per tablet Take 1 tablet by mouth 2 (two) times daily.    . Glucosamine-Chondroitin ([facility] GLUCOSAMINE-CHONDROITIN) 750-600 MG TABS Take 600 mg by mouth 2 (two) times daily.    . hydrochlorothiazide (HYDRODIURIL) 25 MG tablet Take 1 tablet (25 mg total) by mouth daily. 30 tablet 1  . levothyroxine (SYNTHROID, LEVOTHROID) 88 MCG tablet TAKE 1 TABLET DAILY 90 tablet 1  . lisinopril (PRINIVIL,ZESTRIL) 30 MG tablet Take 1 tablet (30 mg total) by mouth daily. 30 tablet 1  . Multiple Vitamin (MULTIVITAMIN) tablet Take 1 tablet by mouth daily.    Marland Kitchen omeprazole  (PRILOSEC) 20 MG capsule TAKE 1 CAPSULE DAILY 90 capsule 1  . potassium chloride SA (K-DUR,KLOR-CON) 20 MEQ tablet TAKE 1 TABLET DAILY 90 tablet 1  . simvastatin (ZOCOR) 10 MG tablet TAKE 1 TABLET AT BEDTIME 90 tablet 1   No current facility-administered medications on file prior to visit.     Past Medical History  Diagnosis Date  . Hypertension   . Hypercholesterolemia   . Esophagitis     s/p esophageal dilatation  . Hypothyroidism   . Endometriosis   . History of ovarian cyst   . Arthritis   . GERD (gastroesophageal reflux disease)   . Chicken pox      Past Surgical History  Procedure Laterality Date  . Abdominal surgery      found to have an ovarian cyst and endometriosis  . Appendectomy       Family History  Problem Relation Age of Onset  . Lung disease Father   . Heart attack Father   . Heart disease Mother     myocardial infarction  . Breast cancer Mother   . Heart attack Mother   . Breast cancer Sister   . Breast cancer Maternal Aunt   . Colon cancer Neg Hx      History   Social History  . Marital Status: Married    Spouse Name: N/A  . Number of Children: N/A  . Years of Education: N/A   Occupational History  . Not on file.   Social History Main Topics  . Smoking status: Never Smoker   . Smokeless tobacco: Never Used  . Alcohol  Use: No  . Drug Use: No  . Sexual Activity: Not on file   Other Topics Concern  . Not on file   Social History Narrative     ROS A 10 point review of system was performed. It is negative other than that mentioned in the history of present illness.   PHYSICAL EXAM   BP 120/68 mmHg  Pulse 68  Ht 5' 2.5" (1.588 m)  Wt 139 lb 8 oz (63.277 kg)  BMI 25.09 kg/m2  LMP 10/21/1984 Constitutional: She is oriented to person, place, and time. She appears well-developed and well-nourished. No distress.  HENT: No nasal discharge.  Head: Normocephalic and atraumatic.  Eyes: Pupils are equal and round. No discharge.    Neck: Normal range of motion. Neck supple. No JVD present. No thyromegaly present.  Cardiovascular: Normal rate, regular rhythm, normal heart sounds. Exam reveals no gallop and no friction rub. No murmur heard.  Pulmonary/Chest: Effort normal and breath sounds normal. No stridor. No respiratory distress. She has no wheezes. She has no rales. She exhibits no tenderness.  Abdominal: Soft. Bowel sounds are normal. She exhibits no distension. There is no tenderness. There is no rebound and no guarding.  Musculoskeletal: Normal range of motion. She exhibits no edema and no tenderness.  Neurological: She is alert and oriented to person, place, and time. Coordination normal.  Skin: Skin is warm and dry. No rash noted. She is not diaphoretic. No erythema. No pallor.  Psychiatric: She has a normal mood and affect. Her behavior is normal. Judgment and thought content normal.     GYI:RSWNI  Rhythm  WITHIN NORMAL LIMITS   ASSESSMENT AND PLAN

## 2014-12-21 ENCOUNTER — Telehealth: Payer: Self-pay

## 2014-12-21 NOTE — Telephone Encounter (Signed)
Pt called, states she is having side effects from Labetatol. States she feels dizzy and "spaced out". Please call.

## 2014-12-21 NOTE — Telephone Encounter (Signed)
Patient stated that she was started on Labetalol in the ED about 10 days ago  She is taking 100 mg BID  She feels really sleepy and cold after she takes it  Some of hr blood pressures during these episodes were 122/70, 121/65, 152/84  Discussed these symptoms with Dr. Fletcher Anon  Dr. Fletcher Anon said she can take 1/2 tablet (50 mg) tonight and start back on regular dose tomorrow  If symptoms continue patient might have to switch beta blocker  Patient verbalized understanding

## 2014-12-25 ENCOUNTER — Telehealth: Payer: Self-pay | Admitting: *Deleted

## 2014-12-25 NOTE — Telephone Encounter (Signed)
Spoke w/ pt.  Advised her that Dr. Fletcher Anon is out of the office today. She verbalizes understanding, but states that although she was advised by our office not to "play with" her meds, she will follow the advise of her pharmacist and adjust the dose of her Labetalol over the weekend. She states that she will monitor her BP and call on Monday to speak w/ Elmyra Ricks. Advised her that I cannot recommend that she do this.   She states that she understands, but will use her best judgement, as she is interested in her "quality of life".

## 2014-12-25 NOTE — Telephone Encounter (Signed)
Pt is calling asking if we could change her medication.  For it is giving her problems See last phone note.  She wants to know what she can do.  Labetalol. Taking twice a day.  But does not know what to do.  Would like to know what to do before the weekend starts.

## 2014-12-28 NOTE — Telephone Encounter (Signed)
What is her blood pressure running now? Does she want to try a different blood pressure medication than labetalol?

## 2014-12-29 NOTE — Telephone Encounter (Signed)
Patient stated she went back to full tablet and is no longer having the negative effects  Her blood pressure has still been mildly elevated  She will continue to monitor

## 2015-01-04 ENCOUNTER — Ambulatory Visit: Payer: Medicare Other | Admitting: Internal Medicine

## 2015-01-05 ENCOUNTER — Ambulatory Visit (INDEPENDENT_AMBULATORY_CARE_PROVIDER_SITE_OTHER): Payer: Medicare Other | Admitting: Internal Medicine

## 2015-01-05 ENCOUNTER — Encounter: Payer: Self-pay | Admitting: Internal Medicine

## 2015-01-05 VITALS — BP 120/68 | HR 73 | Temp 97.7°F | Ht 62.5 in | Wt 140.4 lb

## 2015-01-05 DIAGNOSIS — I10 Essential (primary) hypertension: Secondary | ICD-10-CM

## 2015-01-05 DIAGNOSIS — E039 Hypothyroidism, unspecified: Secondary | ICD-10-CM

## 2015-01-05 DIAGNOSIS — E7800 Pure hypercholesterolemia, unspecified: Secondary | ICD-10-CM

## 2015-01-05 DIAGNOSIS — Z0000 Encounter for general adult medical examination without abnormal findings: Secondary | ICD-10-CM

## 2015-01-05 MED ORDER — HYDROCHLOROTHIAZIDE 25 MG PO TABS: 25.0000 mg | ORAL_TABLET | Freq: Every day | ORAL | Status: DC

## 2015-01-05 MED ORDER — LISINOPRIL 30 MG PO TABS: 30.0000 mg | ORAL_TABLET | Freq: Every day | ORAL | Status: DC

## 2015-01-05 NOTE — Progress Notes (Signed)
Pre visit review using our clinic review tool, if applicable. No additional management support is needed unless otherwise documented below in the visit note. 

## 2015-01-05 NOTE — Progress Notes (Signed)
Patient ID: Crystal Lynch, female   DOB: October 04, 1941, 74 y.o.   MRN: [id number]   Subjective:    Patient ID: Crystal Lynch, female    DOB: [birth year], 74 y.o.   MRN: [id number]  HPI  Patient here for a scheduled follow up.  States she is doing well.  Blood pressure has been elevated.  Saw cardiology.  On labetalol now.  Did not tolerate at first.  Tolerating now.  Blood pressure improved.  Blood pressure recently averaging 120-130s/60-80s.  Occasionally higher.  No cardiac symptoms with increased activity or exertion.    Past Medical History  Diagnosis Date  . Hypertension   . Hypercholesterolemia   . Esophagitis     s/p esophageal dilatation  . Hypothyroidism   . Endometriosis   . History of ovarian cyst   . Arthritis   . GERD (gastroesophageal reflux disease)   . Chicken pox      Outpatient Encounter Prescriptions as of 01/05/2015  Medication Sig  . aspirin 81 MG tablet Take 81 mg by mouth daily.  . Calcium Carbonate-Vitamin D (CALTRATE 600+D) 600-400 MG-UNIT per tablet Take 1 tablet by mouth 2 (two) times daily.  . Glucosamine-Chondroitin ([employer] GLUCOSAMINE-CHONDROITIN) 750-600 MG TABS Take 600 mg by mouth 2 (two) times daily.  . hydrochlorothiazide (HYDRODIURIL) 25 MG tablet Take 1 tablet (25 mg total) by mouth daily.  Marland Kitchen labetalol (NORMODYNE) 100 MG tablet Take 1 tablet (100 mg total) by mouth 2 (two) times daily.  Marland Kitchen levothyroxine (SYNTHROID, LEVOTHROID) 88 MCG tablet TAKE 1 TABLET DAILY  . lisinopril (PRINIVIL,ZESTRIL) 30 MG tablet Take 1 tablet (30 mg total) by mouth daily.  . Multiple Vitamin (MULTIVITAMIN) tablet Take 1 tablet by mouth daily.  Marland Kitchen omeprazole (PRILOSEC) 20 MG capsule TAKE 1 CAPSULE DAILY  . potassium chloride SA (K-DUR,KLOR-CON) 20 MEQ tablet TAKE 1 TABLET DAILY  . simvastatin (ZOCOR) 10 MG tablet TAKE 1 TABLET AT BEDTIME  . [DISCONTINUED] hydrochlorothiazide (HYDRODIURIL) 25 MG tablet Take 1 tablet (25 mg total) by mouth daily.  . [DISCONTINUED]  lisinopril (PRINIVIL,ZESTRIL) 30 MG tablet Take 1 tablet (30 mg total) by mouth daily.    Review of Systems  Constitutional: Negative for appetite change and unexpected weight change.  HENT: Negative for congestion and sinus pressure.   Respiratory: Negative for cough, chest tightness and shortness of breath.   Cardiovascular: Negative for chest pain, palpitations and leg swelling.  Gastrointestinal: Negative for nausea, vomiting, abdominal pain and diarrhea.  Neurological: Negative for dizziness, light-headedness and headaches.      Objective:     Pulse recheck:  68  Physical Exam  Constitutional: She appears well-developed and well-nourished. No distress.  HENT:  Nose: Nose normal.  Mouth/Throat: Oropharynx is clear and moist.  Neck: Neck supple. No thyromegaly present.  Cardiovascular: Normal rate and regular rhythm.   Pulmonary/Chest: Breath sounds normal. No respiratory distress. She has no wheezes.  Abdominal: Soft. Bowel sounds are normal. There is no tenderness.  Musculoskeletal: She exhibits no edema or tenderness.  Lymphadenopathy:    She has no cervical adenopathy.  Skin: No rash noted. No erythema.  Psychiatric: She has a normal mood and affect. Her behavior is normal.    BP 120/68 mmHg  Pulse 73  Temp(Src) 97.7 F (36.5 C) (Oral)  Ht 5' 2.5" (1.588 m)  Wt 140 lb 6 oz (63.674 kg)  BMI 25.25 kg/m2  SpO2 96%  LMP 10/21/1984 Wt Readings from Last 3 Encounters:  01/05/15 140 lb 6 oz (63.674 kg)  12/14/14 139 lb 8 oz (63.277 kg)  12/08/14 139 lb 6.4 oz (63.231 kg)     Lab Results  Component Value Date   WBC 5.9 11/09/2014   HGB 14.6 11/09/2014   HCT 44.1 11/09/2014   PLT 246.0 11/09/2014   GLUCOSE 99 11/09/2014   CHOL 164 11/09/2014   TRIG 121.0 11/09/2014   HDL 49.20 11/09/2014   LDLCALC 91 11/09/2014   ALT 16 11/16/2014   AST 19 11/16/2014   NA 136 11/09/2014   K 4.0 11/09/2014   CL 100 11/09/2014   CREATININE 0.78 11/09/2014   BUN 14  11/09/2014   CO2 29 11/09/2014   TSH 1.42 11/09/2014       Assessment & Plan:   Problem List Items Addressed This Visit    Health care maintenance    Physical 11/09/14.  Colonoscopy 09/05/12.  Mammogram 11/26/14 - Birads I.        Hypercholesterolemia    On simvastatin.  Low cholesterol diet and exercise.  Follow lipid panel and liver function tests.        Relevant Medications   lisinopril (PRINIVIL,ZESTRIL) tablet   hydrochlorothiazide tablet   Other Relevant Orders   Lipid panel   Hepatic function panel   Hypertension - Primary    Blood pressure better.  Continue same medication regimen.  Follow pressures.  Follow met b.       Relevant Medications   lisinopril (PRINIVIL,ZESTRIL) tablet   hydrochlorothiazide tablet   Other Relevant Orders   Basic metabolic panel   Hypothyroidism    On thyroid replacement.  Follow tsh.  Last check wnl.            Einar Pheasant, MD

## 2015-01-10 ENCOUNTER — Encounter: Payer: Self-pay | Admitting: Internal Medicine

## 2015-01-10 DIAGNOSIS — Z0000 Encounter for general adult medical examination without abnormal findings: Secondary | ICD-10-CM | POA: Insufficient documentation

## 2015-01-10 NOTE — Assessment & Plan Note (Signed)
On thyroid replacement.  Follow tsh.  Last check wnl.

## 2015-01-10 NOTE — Assessment & Plan Note (Signed)
Blood pressure better.  Continue same medication regimen.  Follow pressures.  Follow met b.

## 2015-01-10 NOTE — Assessment & Plan Note (Signed)
On simvastatin.  Low cholesterol diet and exercise.  Follow lipid panel and liver function tests.   

## 2015-01-10 NOTE — Assessment & Plan Note (Signed)
Physical 11/09/14.  Colonoscopy 09/05/12.  Mammogram 11/26/14 - Birads I.

## 2015-01-31 ENCOUNTER — Other Ambulatory Visit: Payer: Self-pay | Admitting: Nurse Practitioner

## 2015-02-22 ENCOUNTER — Telehealth: Payer: Self-pay | Admitting: *Deleted

## 2015-02-22 NOTE — Telephone Encounter (Signed)
Pt c/o BP issue: STAT if pt c/o blurred vision, one-sided weakness or slurred speech  : none of these symptoms  1. What are your last 5 BP readings? 178/101 hr 80, after meds 163/94  2. Are you having any other symptoms (ex. Dizziness, headache, blurred vision, passed out)? None of these symptoms  3. What is your BP issue? Was elevated this weekend. Does she need a medication chang?

## 2015-02-22 NOTE — Telephone Encounter (Signed)
Spoke w/ pt.  She reports that her BP continues to be elevated. Advised pt that she was instructed to take extra labetalol if her SBP > 180 at her last ov.  She reports that she has not been doing this. Had lengthy discussion w/ pt regarding anxiety and her stressors.  She states that her husband has mentioned that her anxiety is "getting out of control", he has recommended that she speak w/ a doctor, but she is afraid to take any more pills.  Pt states that she will try not to worry about her BP and not check it as often, as she becomes "obsessive" over it.  She will call back if her numbers remain elevated w/ extra labetalol.

## 2015-02-23 ENCOUNTER — Encounter: Payer: Self-pay | Admitting: Internal Medicine

## 2015-02-25 ENCOUNTER — Other Ambulatory Visit: Payer: Self-pay | Admitting: Internal Medicine

## 2015-03-08 ENCOUNTER — Ambulatory Visit (INDEPENDENT_AMBULATORY_CARE_PROVIDER_SITE_OTHER): Payer: Medicare Other | Admitting: Internal Medicine

## 2015-03-08 ENCOUNTER — Encounter: Payer: Self-pay | Admitting: Internal Medicine

## 2015-03-08 VITALS — BP 130/70 | HR 66 | Temp 98.1°F | Ht 62.5 in | Wt 139.2 lb

## 2015-03-08 DIAGNOSIS — I10 Essential (primary) hypertension: Secondary | ICD-10-CM

## 2015-03-08 DIAGNOSIS — Z0000 Encounter for general adult medical examination without abnormal findings: Secondary | ICD-10-CM

## 2015-03-08 DIAGNOSIS — E7800 Pure hypercholesterolemia, unspecified: Secondary | ICD-10-CM

## 2015-03-08 DIAGNOSIS — G479 Sleep disorder, unspecified: Secondary | ICD-10-CM | POA: Insufficient documentation

## 2015-03-08 MED ORDER — TRAZODONE HCL 50 MG PO TABS: 25.0000 mg | ORAL_TABLET | Freq: Every evening | ORAL | Status: DC | PRN

## 2015-03-08 MED ORDER — LISINOPRIL 40 MG PO TABS: 40.0000 mg | ORAL_TABLET | Freq: Every day | ORAL | Status: DC

## 2015-03-08 NOTE — Assessment & Plan Note (Signed)
Discussed at length with her today.  Discussed treatment options.  Will start trazodone as directed.  Follow.  Get her back in soon to reassess.

## 2015-03-08 NOTE — Progress Notes (Signed)
Patient ID: Crystal Lynch, female   DOB: 20-Mar-1941, 74 y.o.   MRN: [id number]   Subjective:    Patient ID: Crystal Lynch, female    DOB: [birth year], 74 y.o.   MRN: [id number]  HPI  Patient here for a scheduled follow up.  Here to discuss her blood pressure and sleeping issues.  States is not sleeping through the night.  Wakes up and goes to the bathroom and then mind starts racing.  Unable to go back to sleep  Feels this is contributing to her elevated blood pressure.  Blood pressures varying.  See attached list.  Overall elevated.  Stays active.  No cardiac symptoms with increased activity or exertion.  Breathing stable.  Bowels stable.     Past Medical History  Diagnosis Date  . Hypertension   . Hypercholesterolemia   . Esophagitis     s/p esophageal dilatation  . Hypothyroidism   . Endometriosis   . History of ovarian cyst   . Arthritis   . GERD (gastroesophageal reflux disease)   . Chicken pox     Current Outpatient Prescriptions on File Prior to Visit  Medication Sig Dispense Refill  . aspirin 81 MG tablet Take 81 mg by mouth daily.    . Calcium Carbonate-Vitamin D (CALTRATE 600+D) 600-400 MG-UNIT per tablet Take 1 tablet by mouth 2 (two) times daily.    . Glucosamine-Chondroitin ([employer] GLUCOSAMINE-CHONDROITIN) 750-600 MG TABS Take 600 mg by mouth 2 (two) times daily.    . hydrochlorothiazide (HYDRODIURIL) 25 MG tablet TAKE 1 TABLET (25 MG TOTAL) BY MOUTH DAILY. 30 tablet 5  . labetalol (NORMODYNE) 100 MG tablet Take 1 tablet (100 mg total) by mouth 2 (two) times daily. 180 tablet 3  . levothyroxine (SYNTHROID, LEVOTHROID) 88 MCG tablet TAKE 1 TABLET DAILY 90 tablet 1  . Multiple Vitamin (MULTIVITAMIN) tablet Take 1 tablet by mouth daily.    Marland Kitchen omeprazole (PRILOSEC) 20 MG capsule TAKE 1 CAPSULE DAILY 90 capsule 1  . potassium chloride SA (K-DUR,KLOR-CON) 20 MEQ tablet TAKE 1 TABLET DAILY 90 tablet 1  . simvastatin (ZOCOR) 10 MG tablet TAKE 1 TABLET AT BEDTIME 90 tablet 1    No current facility-administered medications on file prior to visit.    Review of Systems  Constitutional: Negative for appetite change and unexpected weight change.  HENT: Negative for congestion and sinus pressure.   Respiratory: Negative for cough, chest tightness and shortness of breath.   Cardiovascular: Negative for chest pain, palpitations and leg swelling.  Gastrointestinal: Negative for nausea, vomiting, abdominal pain and diarrhea.  Skin: Negative for color change and rash.  Neurological: Negative for dizziness, light-headedness and headaches.  Psychiatric/Behavioral: Positive for sleep disturbance.       Increased stress with some family health issues.  Feels if she could get a good night's sleep, could handle.         Objective:     Blood pressure recheck:  148/78  Physical Exam  Constitutional: She appears well-developed and well-nourished. No distress.  HENT:  Nose: Nose normal.  Mouth/Throat: Oropharynx is clear and moist.  Neck: Neck supple. No thyromegaly present.  Cardiovascular: Normal rate and regular rhythm.   Pulmonary/Chest: Breath sounds normal. No respiratory distress. She has no wheezes.  Abdominal: Soft. Bowel sounds are normal. There is no tenderness.  Musculoskeletal: She exhibits no edema or tenderness.  Lymphadenopathy:    She has no cervical adenopathy.  Skin: No rash noted. No erythema.  Psychiatric: She has a normal mood  and affect. Her behavior is normal.    BP 130/70 mmHg  Pulse 66  Temp(Src) 98.1 F (36.7 C) (Oral)  Ht 5' 2.5" (1.588 m)  Wt 139 lb 4 oz (63.163 kg)  BMI 25.05 kg/m2  SpO2 97%  LMP 10/21/1984 Wt Readings from Last 3 Encounters:  03/08/15 139 lb 4 oz (63.163 kg)  01/05/15 140 lb 6 oz (63.674 kg)  12/14/14 139 lb 8 oz (63.277 kg)     Lab Results  Component Value Date   WBC 5.9 11/09/2014   HGB 14.6 11/09/2014   HCT 44.1 11/09/2014   PLT 246.0 11/09/2014   GLUCOSE 99 11/09/2014   CHOL 164 11/09/2014   TRIG  121.0 11/09/2014   HDL 49.20 11/09/2014   LDLCALC 91 11/09/2014   ALT 16 11/16/2014   AST 19 11/16/2014   NA 136 11/09/2014   K 4.0 11/09/2014   CL 100 11/09/2014   CREATININE 0.78 11/09/2014   BUN 14 11/09/2014   CO2 29 11/09/2014   TSH 1.42 11/09/2014       Assessment & Plan:   Problem List Items Addressed This Visit    Health care maintenance    Physical 11/09/14.  Colonoscopy 09/05/12.  Mammogram 11/26/14 - Birads I.       Hypercholesterolemia    Low cholesterol diet and exercise.  On simvastatin.  Check lipid panel and liver function tests.        Relevant Medications   lisinopril (PRINIVIL,ZESTRIL) 40 MG tablet   Hypertension - Primary    Blood pressure still elevated.  Increase lisinopril to 40mg  q day.  Continue labetolol and hctz as she is doing.  Treat with sleep issue.  I feel this will improve the pressure.  Start trazodone as outlined.  Follow.  Get her back in soon to reassess.        Relevant Medications   lisinopril (PRINIVIL,ZESTRIL) 40 MG tablet   Sleeping difficulties    Discussed at length with her today.  Discussed treatment options.  Will start trazodone as directed.  Follow.  Get her back in soon to reassess.          I spent 25 minutes with the patient and more than 50% of the time was spent in consultation regarding the above.     Einar Pheasant, MD

## 2015-03-08 NOTE — Assessment & Plan Note (Signed)
Physical 11/09/14.  Colonoscopy 09/05/12.  Mammogram 11/26/14 - Birads I.

## 2015-03-08 NOTE — Assessment & Plan Note (Signed)
Low cholesterol diet and exercise.  On simvastatin.  Check lipid panel and liver function tests.

## 2015-03-08 NOTE — Patient Instructions (Signed)
Change lisinopril 30mg  to lisinopril 40mg  per day.

## 2015-03-08 NOTE — Assessment & Plan Note (Signed)
Blood pressure still elevated.  Increase lisinopril to 40mg  q day.  Continue labetolol and hctz as she is doing.  Treat with sleep issue.  I feel this will improve the pressure.  Start trazodone as outlined.  Follow.  Get her back in soon to reassess.

## 2015-03-08 NOTE — Progress Notes (Signed)
Pre visit review using our clinic review tool, if applicable. No additional management support is needed unless otherwise documented below in the visit note. 

## 2015-04-08 ENCOUNTER — Other Ambulatory Visit: Payer: Self-pay | Admitting: Internal Medicine

## 2015-04-18 ENCOUNTER — Other Ambulatory Visit: Payer: Self-pay | Admitting: Internal Medicine

## 2015-05-09 ENCOUNTER — Other Ambulatory Visit: Payer: Self-pay | Admitting: Internal Medicine

## 2015-05-10 ENCOUNTER — Encounter: Payer: Self-pay | Admitting: Internal Medicine

## 2015-05-10 MED ORDER — LISINOPRIL 40 MG PO TABS: 40.0000 mg | ORAL_TABLET | Freq: Every day | ORAL | Status: DC

## 2015-05-10 NOTE — Telephone Encounter (Signed)
[facility]90 with one refill.  Pt notified via my chart.

## 2015-05-28 ENCOUNTER — Other Ambulatory Visit: Payer: Self-pay | Admitting: Internal Medicine

## 2015-05-31 DIAGNOSIS — M5430 Sciatica, unspecified side: Secondary | ICD-10-CM | POA: Insufficient documentation

## 2015-06-08 ENCOUNTER — Encounter: Payer: Self-pay | Admitting: *Deleted

## 2015-06-14 ENCOUNTER — Encounter: Payer: Self-pay | Admitting: Cardiovascular Disease

## 2015-06-14 ENCOUNTER — Ambulatory Visit (INDEPENDENT_AMBULATORY_CARE_PROVIDER_SITE_OTHER): Payer: Medicare Other | Admitting: Cardiovascular Disease

## 2015-06-14 VITALS — BP 150/80 | HR 71 | Ht 62.58 in | Wt 134.2 lb

## 2015-06-14 DIAGNOSIS — R0602 Shortness of breath: Secondary | ICD-10-CM

## 2015-06-14 DIAGNOSIS — I10 Essential (primary) hypertension: Secondary | ICD-10-CM | POA: Diagnosis not present

## 2015-06-14 MED ORDER — LABETALOL HCL 200 MG PO TABS: 100.0000 mg | ORAL_TABLET | Freq: Two times a day (BID) | ORAL | Status: DC

## 2015-06-14 MED ORDER — LABETALOL HCL 200 MG PO TABS: 200.0000 mg | ORAL_TABLET | Freq: Two times a day (BID) | ORAL | Status: DC

## 2015-06-14 NOTE — Progress Notes (Signed)
Primary care physician: Dr. Einar Pheasant  HPI  This is a pleasant 74 year old female who is here today for a follow-up visit regarding  hypertension. She has no previous cardiac history. Other medical problems include hyperlipidemia and GERD.  Blood pressure has been increasing over baseline since February of this year. This required increasing her antihypertensives medications. Labetalol was most recently added. She reports improvement in blood pressure. However, she brought her home readings with her today and she is still having frequent readings of systolic blood pressure above 150. When have blood pressure is elevated, she complains of fatigue and anxiety. No chest pain or shortness of breath. She was taking meloxicam for back pain but that was discontinued by Dr. Nicki Reaper.  Allergies  Allergen Reactions  . Meloxicam     BP issues     Current Outpatient Prescriptions on File Prior to Visit  Medication Sig Dispense Refill  . aspirin 81 MG tablet Take 81 mg by mouth daily.    . Calcium Carbonate-Vitamin D (CALTRATE 600+D) 600-400 MG-UNIT per tablet Take 1 tablet by mouth 2 (two) times daily.    . Glucosamine-Chondroitin ([facility] GLUCOSAMINE-CHONDROITIN) 750-600 MG TABS Take 600 mg by mouth 2 (two) times daily.    . hydrochlorothiazide (HYDRODIURIL) 25 MG tablet TAKE 1 TABLET (25 MG TOTAL) BY MOUTH DAILY. 30 tablet 5  . levothyroxine (SYNTHROID, LEVOTHROID) 88 MCG tablet TAKE 1 TABLET DAILY 90 tablet 1  . lisinopril (PRINIVIL,ZESTRIL) 40 MG tablet TAKE 1 TABLET (40 MG TOTAL) BY MOUTH DAILY. 30 tablet 2  . Multiple Vitamin (MULTIVITAMIN) tablet Take 1 tablet by mouth daily.    Marland Kitchen omeprazole (PRILOSEC) 20 MG capsule TAKE 1 CAPSULE DAILY 90 capsule 1  . potassium chloride SA (K-DUR,KLOR-CON) 20 MEQ tablet TAKE 1 TABLET DAILY 90 tablet 1  . simvastatin (ZOCOR) 10 MG tablet TAKE 1 TABLET AT BEDTIME 90 tablet 1  . traZODone (DESYREL) 50 MG tablet TAKE 1/2 TO 1 TABLET (25-50 MG TOTAL) BY MOUTH AT  BEDTIME AS NEEDED FOR SLEEP. 30 tablet 1   No current facility-administered medications on file prior to visit.     Past Medical History  Diagnosis Date  . Hypertension   . Hypercholesterolemia   . Esophagitis     s/p esophageal dilatation  . Hypothyroidism   . Endometriosis   . History of ovarian cyst   . Arthritis   . GERD (gastroesophageal reflux disease)   . Chicken pox      Past Surgical History  Procedure Laterality Date  . Abdominal surgery      found to have an ovarian cyst and endometriosis  . Appendectomy       Family History  Problem Relation Age of Onset  . Lung disease Father   . Heart attack Father   . Heart disease Mother     myocardial infarction  . Breast cancer Mother   . Heart attack Mother   . Breast cancer Sister   . Breast cancer Maternal Aunt   . Colon cancer Neg Hx      Social History   Social History  . Marital Status: Married    Spouse Name: N/A  . Number of Children: N/A  . Years of Education: N/A   Occupational History  . Not on file.   Social History Main Topics  . Smoking status: Never Smoker   . Smokeless tobacco: Never Used  . Alcohol Use: No  . Drug Use: No  . Sexual Activity: Not on file   Other  Topics Concern  . Not on file   Social History Narrative     ROS A 10 point review of system was performed. It is negative other than that mentioned in the history of present illness.   PHYSICAL EXAM   BP 150/80 mmHg  Pulse 71  Ht 5' 2.58" (1.59 m)  Wt 134 lb 4 oz (60.895 kg)  BMI 24.09 kg/m2  LMP 10/21/1984 Constitutional: She is oriented to person, place, and time. She appears well-developed and well-nourished. No distress.  HENT: No nasal discharge.  Head: Normocephalic and atraumatic.  Eyes: Pupils are equal and round. No discharge.  Neck: Normal range of motion. Neck supple. No JVD present. No thyromegaly present.  Cardiovascular: Normal rate, regular rhythm, normal heart sounds. Exam reveals no gallop  and no friction rub. No murmur heard.  Pulmonary/Chest: Effort normal and breath sounds normal. No stridor. No respiratory distress. She has no wheezes. She has no rales. She exhibits no tenderness.  Abdominal: Soft. Bowel sounds are normal. She exhibits no distension. There is no tenderness. There is no rebound and no guarding.  Musculoskeletal: Normal range of motion. She exhibits no edema and no tenderness.  Neurological: She is alert and oriented to person, place, and time. Coordination normal.  Skin: Skin is warm and dry. No rash noted. She is not diaphoretic. No erythema. No pallor.  Psychiatric: She has a normal mood and affect. Her behavior is normal. Judgment and thought content normal.     XEN:MMHWK  Rhythm  WITHIN NORMAL LIMITS   ASSESSMENT AND PLAN

## 2015-06-14 NOTE — Assessment & Plan Note (Signed)
Blood pressure is still elevated in spite of 3 antihypotensive medications including a thiazide diuretic. Anxiety might be contributing but we should exclude secondary causes of hypertension given that her blood pressure was well-controlled up until early this year. I requested a renal artery duplex to evaluate for possible renal artery stenosis. I increased the dose of labetalol to 200 mg twice daily. Continue lisinopril and hydrochlorothiazide.

## 2015-06-14 NOTE — Patient Instructions (Addendum)
Medication Instructions:  Your physician has recommended you make the following change in your medication:  INCREASE labetalol to 200mg  twice per day   Labwork: none  Testing/Procedures: Your physician has requested that you have a renal artery duplex. During this test, an ultrasound is used to evaluate blood flow to the kidneys. Allow one hour for this exam. Do not eat after midnight the day before and avoid carbonated beverages. Take your medications as you usually do.    Follow-Up: Your physician recommends that you schedule a follow-up appointment in: three months with Dr. Fletcher Anon.    Any Other Special Instructions Will Be Listed Below (If Applicable).

## 2015-06-16 ENCOUNTER — Telehealth: Payer: Self-pay | Admitting: *Deleted

## 2015-06-16 NOTE — Telephone Encounter (Signed)
Pt was recently seen in office and was put on new medication  Labetalol, he increased it to 200 mg from 100 mg  When she picked it up they told her to take half a pill which would then make her go back to 100 mg twice a day  This is what she was taking before Now she is confused is it 200 mg twice a day and or 100 twice a day Took the 200 mg twice a day yesterday and felt a little funny Now she is calling to make sure.  Please advise.

## 2015-06-16 NOTE — Telephone Encounter (Signed)
S/w pt who states when husband picked up labetalol prescription, pill bottle read "take 1/2 pill  twice per day" If she does that, she will be taking only 100mg  BID which is what she was originally taking. At August OV, Dr. Fletcher Anon increased labetalol to 200mg  BID. Confirmed pt should be taking higher dose. Pt verbalized understanding and states she will take one tablet (200mg ) twice per day. Pt had no further questions.

## 2015-06-25 ENCOUNTER — Ambulatory Visit (INDEPENDENT_AMBULATORY_CARE_PROVIDER_SITE_OTHER): Payer: Medicare Other

## 2015-06-25 DIAGNOSIS — I10 Essential (primary) hypertension: Secondary | ICD-10-CM

## 2015-07-01 ENCOUNTER — Telehealth: Payer: Self-pay | Admitting: Cardiovascular Disease

## 2015-07-01 NOTE — Telephone Encounter (Signed)
Wants Renal U/S results please call

## 2015-07-01 NOTE — Telephone Encounter (Signed)
Returning call from sharon for u/s results

## 2015-07-01 NOTE — Telephone Encounter (Signed)
Please see result note 

## 2015-07-09 ENCOUNTER — Encounter: Payer: Self-pay | Admitting: Cardiovascular Disease

## 2015-07-09 ENCOUNTER — Ambulatory Visit (INDEPENDENT_AMBULATORY_CARE_PROVIDER_SITE_OTHER): Payer: Medicare Other | Admitting: Cardiovascular Disease

## 2015-07-09 VITALS — BP 130/64 | HR 67 | Ht 62.5 in | Wt 133.5 lb

## 2015-07-09 DIAGNOSIS — E7800 Pure hypercholesterolemia, unspecified: Secondary | ICD-10-CM

## 2015-07-09 DIAGNOSIS — I150 Renovascular hypertension: Secondary | ICD-10-CM | POA: Insufficient documentation

## 2015-07-09 MED ORDER — LABETALOL HCL 300 MG PO TABS: 300.0000 mg | ORAL_TABLET | Freq: Two times a day (BID) | ORAL | Status: DC

## 2015-07-09 NOTE — Progress Notes (Signed)
Primary care physician: Dr. Einar Pheasant  HPI  This is a pleasant 74 year old female who is here today for a follow-up visit regarding refractory  hypertension and recently diagnosed renal artery stenosis. She has no previous cardiac history. Other medical problems include hyperlipidemia and GERD.  Blood pressure has been increasing over baseline since February of this year. This required increasing her antihypertensives medications. During last visit, I increased the dose of labetalol 200 mg twice daily. Renal artery duplex was performed which showed significant greater than 60% stenosis in the left renal artery and moderate right renal artery stenosis. Blood pressure overall improved since last visit although she continues to have readings about 784 systolic at home.  Allergies  Allergen Reactions  . Meloxicam     BP issues     Current Outpatient Prescriptions on File Prior to Visit  Medication Sig Dispense Refill  . aspirin 81 MG tablet Take 81 mg by mouth daily.    . Calcium Carbonate-Vitamin D (CALTRATE 600+D) 600-400 MG-UNIT per tablet Take 1 tablet by mouth 2 (two) times daily.    . Glucosamine-Chondroitin ([facility] GLUCOSAMINE-CHONDROITIN) 750-600 MG TABS Take 600 mg by mouth 2 (two) times daily.    . hydrochlorothiazide (HYDRODIURIL) 25 MG tablet TAKE 1 TABLET (25 MG TOTAL) BY MOUTH DAILY. 30 tablet 5  . levothyroxine (SYNTHROID, LEVOTHROID) 88 MCG tablet TAKE 1 TABLET DAILY 90 tablet 1  . lisinopril (PRINIVIL,ZESTRIL) 40 MG tablet TAKE 1 TABLET (40 MG TOTAL) BY MOUTH DAILY. 30 tablet 2  . Multiple Vitamin (MULTIVITAMIN) tablet Take 1 tablet by mouth daily.    Marland Kitchen omeprazole (PRILOSEC) 20 MG capsule TAKE 1 CAPSULE DAILY 90 capsule 1  . potassium chloride SA (K-DUR,KLOR-CON) 20 MEQ tablet TAKE 1 TABLET DAILY 90 tablet 1  . simvastatin (ZOCOR) 10 MG tablet TAKE 1 TABLET AT BEDTIME 90 tablet 1  . traZODone (DESYREL) 50 MG tablet TAKE 1/2 TO 1 TABLET (25-50 MG TOTAL) BY MOUTH AT BEDTIME  AS NEEDED FOR SLEEP. 30 tablet 1   No current facility-administered medications on file prior to visit.     Past Medical History  Diagnosis Date  . Hypertension   . Hypercholesterolemia   . Esophagitis     s/p esophageal dilatation  . Hypothyroidism   . Endometriosis   . History of ovarian cyst   . Arthritis   . GERD (gastroesophageal reflux disease)   . Chicken pox      Past Surgical History  Procedure Laterality Date  . Abdominal surgery      found to have an ovarian cyst and endometriosis  . Appendectomy       Family History  Problem Relation Age of Onset  . Lung disease Father   . Heart attack Father   . Heart disease Mother     myocardial infarction  . Breast cancer Mother   . Heart attack Mother   . Breast cancer Sister   . Breast cancer Maternal Aunt   . Colon cancer Neg Hx      Social History   Social History  . Marital Status: Married    Spouse Name: N/A  . Number of Children: N/A  . Years of Education: N/A   Occupational History  . Not on file.   Social History Main Topics  . Smoking status: Never Smoker   . Smokeless tobacco: Never Used  . Alcohol Use: No  . Drug Use: No  . Sexual Activity: Not on file   Other Topics Concern  . Not  on file   Social History Narrative     ROS A 10 point review of system was performed. It is negative other than that mentioned in the history of present illness.   PHYSICAL EXAM   BP 130/64 mmHg  Pulse 67  Ht 5' 2.5" (1.588 m)  Wt 133 lb 8 oz (60.555 kg)  BMI 24.01 kg/m2  LMP 10/21/1984 Constitutional: She is oriented to person, place, and time. She appears well-developed and well-nourished. No distress.  HENT: No nasal discharge.  Head: Normocephalic and atraumatic.  Eyes: Pupils are equal and round. No discharge.  Neck: Normal range of motion. Neck supple. No JVD present. No thyromegaly present.  Cardiovascular: Normal rate, regular rhythm, normal heart sounds. Exam reveals no gallop and no  friction rub. No murmur heard.  Pulmonary/Chest: Effort normal and breath sounds normal. No stridor. No respiratory distress. She has no wheezes. She has no rales. She exhibits no tenderness.  Abdominal: Soft. Bowel sounds are normal. She exhibits no distension. There is no tenderness. There is no rebound and no guarding.  Musculoskeletal: Normal range of motion. She exhibits no edema and no tenderness.  Neurological: She is alert and oriented to person, place, and time. Coordination normal.  Skin: Skin is warm and dry. No rash noted. She is not diaphoretic. No erythema. No pallor.  Psychiatric: She has a normal mood and affect. Her behavior is normal. Judgment and thought content normal.      ASSESSMENT AND PLAN

## 2015-07-09 NOTE — Patient Instructions (Addendum)
Medication Instructions:  Your physician has recommended you make the following change in your medication:  INCREASE labetalol to 300mg  twice per day   Labwork: none  Testing/Procedures: none  Follow-Up: Your physician recommends that you schedule a follow-up appointment in: three months with Dr. Fletcher Anon   Any Other Special Instructions Will Be Listed Below (If Applicable).

## 2015-07-09 NOTE — Assessment & Plan Note (Signed)
Renal artery duplex showed significant unilateral renal artery stenosis which could be the culprit for refractory hypertension. I discussed with her the natural history and management of renal artery stenosis. Renal artery revascularization is now left as a last resort due to overall negative clinical trials and issues of long-term patency. Thus, I favor maximizing her medications before considering renal artery angiography and stenting. I increased the dose of labetalol to 300 mg twice daily. Continue treatment with lisinopril and hydrochlorothiazide. Continue to monitor blood pressure at home and follow-up with me in 3 months. If blood pressure continues to be uncontrolled, then I plan on proceeding with renal artery angiography and stenting at that time.

## 2015-07-09 NOTE — Assessment & Plan Note (Signed)
Lab Results  Component Value Date   CHOL 164 11/09/2014   HDL 49.20 11/09/2014   LDLCALC 91 11/09/2014   TRIG 121.0 11/09/2014   CHOLHDL 3 11/09/2014   Continue treatment with simvastatin with a target LDL of less than 100.

## 2015-07-13 ENCOUNTER — Other Ambulatory Visit: Payer: Self-pay | Admitting: Internal Medicine

## 2015-07-26 ENCOUNTER — Telehealth: Payer: Self-pay | Admitting: *Deleted

## 2015-07-26 NOTE — Telephone Encounter (Signed)
Left message on machine for patient to contact the office.   

## 2015-07-26 NOTE — Telephone Encounter (Signed)
We have been back and forth with helping pt with BP  continues to make pt dizzy and woozy, dreads taking second pill in the day for how it makes her feel She's talk to her pharmacist and they told her to ask about doing a time release drug Wanted to know if this an option for her Please advise   Pt would like to know if we can mychart her the response.

## 2015-07-27 NOTE — Telephone Encounter (Signed)
S/w pt who states she has been taking increased dose of labetalol (300mg  BID) since 9/16 OV. States since then, she feels dizzy, disoriented and "just out of it feeling" after she takes labetalol. States she does not think it is keeping her BP regulated  Reports the following readings:  163/78 HR 67, 172/82 - before labetolol 127/64 after labetolol   Advised pt to record BP before and 1 hour after labetalol and report readings to [place]. Pt agreeable to plan and will CB with readings.

## 2015-07-28 ENCOUNTER — Other Ambulatory Visit: Payer: Self-pay

## 2015-07-28 MED ORDER — CARVEDILOL 6.25 MG PO TABS: 6.2500 mg | ORAL_TABLET | Freq: Two times a day (BID) | ORAL | Status: DC

## 2015-07-28 NOTE — Telephone Encounter (Signed)
S/w pt regarding Dr. Tyrell Antonio recommendations. Pt agreeable to plan Prescription submitted to [facility]. Forwarded to scheduling for appt.

## 2015-07-28 NOTE — Telephone Encounter (Signed)
Pt calling back to give more BP Readings  This morning 184/93 before Labetolol 150/72 after the Labetolol  9pm yesterday 168/89 1030pm 140/70 Please advise

## 2015-07-28 NOTE — Telephone Encounter (Signed)
Pt called back with two BP readings. See prior note regarding new symptoms that she feels is related to increased labetalol.  Forward to MD

## 2015-07-28 NOTE — Telephone Encounter (Signed)
Switch Labetalol to Carvedilol 6.26 mg bid. Schedule follow up with me in few weeks as we might need to consider proceeding with renal artery stenting.

## 2015-07-30 ENCOUNTER — Telehealth: Payer: Self-pay

## 2015-07-30 NOTE — Telephone Encounter (Signed)
Pt needs to discuss her BP medication. Please call. States her BP is still elevated This morning, 177/96, 12:30 172/84, Oct 6-191/92

## 2015-08-02 NOTE — Telephone Encounter (Signed)
S/w pt who reports feeling "much better"  discontinuing labetalol and adding coreg. Reports BP of  146/79, most often in the 140s. Offered pt earlier appt w/Dr. Fletcher Anon 9 (Oct 20)  to discuss renal artery stenting. [place] and will keep original appt on Nov 7. Will continue to monitor BP

## 2015-08-02 NOTE — Telephone Encounter (Signed)
Left message on machine for patient to contact the office.   

## 2015-08-12 ENCOUNTER — Ambulatory Visit: Payer: Medicare Other | Admitting: Cardiovascular Disease

## 2015-08-12 ENCOUNTER — Encounter: Payer: Self-pay | Admitting: Cardiovascular Disease

## 2015-08-12 ENCOUNTER — Ambulatory Visit (INDEPENDENT_AMBULATORY_CARE_PROVIDER_SITE_OTHER): Payer: Medicare Other | Admitting: Cardiovascular Disease

## 2015-08-12 VITALS — BP 160/90 | HR 65 | Ht 62.5 in | Wt 132.1 lb

## 2015-08-12 DIAGNOSIS — I158 Other secondary hypertension: Secondary | ICD-10-CM

## 2015-08-12 DIAGNOSIS — E7800 Pure hypercholesterolemia, unspecified: Secondary | ICD-10-CM

## 2015-08-12 DIAGNOSIS — Z01812 Encounter for preprocedural laboratory examination: Secondary | ICD-10-CM

## 2015-08-12 DIAGNOSIS — I151 Hypertension secondary to other renal disorders: Secondary | ICD-10-CM

## 2015-08-12 DIAGNOSIS — I150 Renovascular hypertension: Secondary | ICD-10-CM

## 2015-08-12 NOTE — Progress Notes (Signed)
Primary care physician: Dr. Einar Pheasant  HPI  This is a pleasant 74 year old female who is here today for a follow-up visit regarding refractory  hypertension and recently diagnosed renal artery stenosis. She has no previous cardiac history. Other medical problems include hyperlipidemia and GERD.  Blood pressure has been increasing over baseline since February of this year. This required increasing her antihypertensives medications.  Renal artery duplex was performed showed significant greater than 60% stenosis in the left renal artery and moderate right renal artery stenosis. She did not tolerate higher doses of labetalol. Thus, I switched her to carvedilol. She is also on lisinopril and hydrochlorothiazide. Her blood pressure continues to be significantly elevated with systolic blood pressure consistently above 308 systolic and occasionally close to 200. She feels extremely fatigued and tired with this.  Allergies  Allergen Reactions  . Meloxicam     BP issues     Current Outpatient Prescriptions on File Prior to Visit  Medication Sig Dispense Refill  . aspirin 81 MG tablet Take 81 mg by mouth daily.    . Calcium Carbonate-Vitamin D (CALTRATE 600+D) 600-400 MG-UNIT per tablet Take 1 tablet by mouth 2 (two) times daily.    . carvedilol (COREG) 6.25 MG tablet Take 1 tablet (6.25 mg total) by mouth 2 (two) times daily. 60 tablet 3  . Glucosamine-Chondroitin ([facility] GLUCOSAMINE-CHONDROITIN) 750-600 MG TABS Take 600 mg by mouth 2 (two) times daily.    . hydrochlorothiazide (HYDRODIURIL) 25 MG tablet TAKE 1 TABLET DAILY 90 tablet 4  . levothyroxine (SYNTHROID, LEVOTHROID) 88 MCG tablet TAKE 1 TABLET DAILY 90 tablet 1  . lisinopril (PRINIVIL,ZESTRIL) 40 MG tablet TAKE 1 TABLET (40 MG TOTAL) BY MOUTH DAILY. 30 tablet 2  . Multiple Vitamin (MULTIVITAMIN) tablet Take 1 tablet by mouth daily.    Marland Kitchen omeprazole (PRILOSEC) 20 MG capsule TAKE 1 CAPSULE DAILY 90 capsule 1  . potassium chloride SA  (K-DUR,KLOR-CON) 20 MEQ tablet TAKE 1 TABLET DAILY 90 tablet 1  . simvastatin (ZOCOR) 10 MG tablet TAKE 1 TABLET AT BEDTIME 90 tablet 1   No current facility-administered medications on file prior to visit.     Past Medical History  Diagnosis Date  . Hypertension   . Hypercholesterolemia   . Esophagitis     s/p esophageal dilatation  . Hypothyroidism   . Endometriosis   . History of ovarian cyst   . Arthritis   . GERD (gastroesophageal reflux disease)   . Chicken pox      Past Surgical History  Procedure Laterality Date  . Abdominal surgery      found to have an ovarian cyst and endometriosis  . Appendectomy       Family History  Problem Relation Age of Onset  . Lung disease Father   . Heart attack Father   . Heart disease Mother     myocardial infarction  . Breast cancer Mother   . Heart attack Mother   . Breast cancer Sister   . Breast cancer Maternal Aunt   . Colon cancer Neg Hx      Social History   Social History  . Marital Status: Married    Spouse Name: N/A  . Number of Children: N/A  . Years of Education: N/A   Occupational History  . Not on file.   Social History Main Topics  . Smoking status: Never Smoker   . Smokeless tobacco: Never Used  . Alcohol Use: No  . Drug Use: No  . Sexual Activity: Not  on file   Other Topics Concern  . Not on file   Social History Narrative     ROS A 10 point review of system was performed. It is negative other than that mentioned in the history of present illness.   PHYSICAL EXAM   BP 160/90 mmHg  Pulse 65  Ht 5' 2.5" (1.588 m)  Wt 132 lb 2 oz (59.932 kg)  BMI 23.77 kg/m2  LMP 10/21/1984 Constitutional: She is oriented to person, place, and time. She appears well-developed and well-nourished. No distress.  HENT: No nasal discharge.  Head: Normocephalic and atraumatic.  Eyes: Pupils are equal and round. No discharge.  Neck: Normal range of motion. Neck supple. No JVD present. No thyromegaly  present.  Cardiovascular: Normal rate, regular rhythm, normal heart sounds. Exam reveals no gallop and no friction rub. No murmur heard.  Pulmonary/Chest: Effort normal and breath sounds normal. No stridor. No respiratory distress. She has no wheezes. She has no rales. She exhibits no tenderness.  Abdominal: Soft. Bowel sounds are normal. She exhibits no distension. There is no tenderness. There is no rebound and no guarding.  Musculoskeletal: Normal range of motion. She exhibits no edema and no tenderness.  Neurological: She is alert and oriented to person, place, and time. Coordination normal.  Skin: Skin is warm and dry. No rash noted. She is not diaphoretic. No erythema. No pallor.  Psychiatric: She has a normal mood and affect. Her behavior is normal. Judgment and thought content normal.      ASSESSMENT AND PLAN

## 2015-08-12 NOTE — Assessment & Plan Note (Signed)
Lab Results  Component Value Date   CHOL 164 11/09/2014   HDL 49.20 11/09/2014   LDLCALC 91 11/09/2014   TRIG 121.0 11/09/2014   CHOLHDL 3 11/09/2014   Continue treatment with simvastatin.

## 2015-08-12 NOTE — Assessment & Plan Note (Signed)
The patient continues to have refractory hypertension in spite of 3 blood pressure medications including a thiazide diuretic. The dose of carvedilol cannot be increased further due to relatively low heart rate. Due to all of that, I think renal artery revascularization is indicated. I recommend proceeding with renal angiography and possible renal artery stenting. I discussed the risks and benefits. We can also consider adding spironolactone in the future if needed if no improvement with the above.

## 2015-08-12 NOTE — Patient Instructions (Addendum)
Medication Instructions:  Your physician recommends that you continue on your current medications as directed. Please refer to the Current Medication list given to you today.   Labwork: BMET, CBC, PT/INR  Testing/Procedures: Renal angiogram with possible renal stenting Wednesday, October 26, 11:30am Arrival time: 9:30am  NOTHING TO EAT OR DRINK AFTER MIDNIGHT THE EVENING BEFORE YOUR PROCEDURE. You may take your medications with a sip of water  [facility] [phone]      Follow-Up: Your physician recommends that you schedule a follow-up appointment in: 4 weeks with Dr. Fletcher Anon.    Any Other Special Instructions Will Be Listed Below (If Applicable).

## 2015-08-13 LAB — BASIC METABOLIC PANEL
BUN / CREAT RATIO: 19 (ref 11–26)
BUN: 15 mg/dL (ref 8–27)
CO2: 18 mmol/L (ref 18–29)
CREATININE: 0.79 mg/dL (ref 0.57–1.00)
Calcium: 10.6 mg/dL — ABNORMAL HIGH (ref 8.7–10.3)
Chloride: 95 mmol/L — ABNORMAL LOW (ref 97–106)
GFR calc Af Amer: 85 mL/min/{1.73_m2} (ref >59)
GFR, EST NON AFRICAN AMERICAN: 73 mL/min/{1.73_m2} (ref >59)
Glucose: 131 mg/dL — ABNORMAL HIGH (ref 65–99)
POTASSIUM: 4 mmol/L (ref 3.5–5.2)
SODIUM: 136 mmol/L (ref 136–144)

## 2015-08-13 LAB — PROTIME-INR
INR: 1 (ref 0.8–1.2)
Prothrombin Time: 10.3 s (ref 9.1–12.0)

## 2015-08-13 LAB — CBC
Hematocrit: 42.2 % (ref 34.0–46.6)
Hemoglobin: 14.3 g/dL (ref 11.1–15.9)
MCH: 30 pg (ref 26.6–33.0)
MCHC: 33.9 g/dL (ref 31.5–35.7)
MCV: 89 fL (ref 79–97)
PLATELETS: 267 10*3/uL (ref 150–379)
RBC: 4.77 x10E6/uL (ref 3.77–5.28)
RDW: 13.1 % (ref 12.3–15.4)
WBC: 6.7 10*3/uL (ref 3.4–10.8)

## 2015-08-16 ENCOUNTER — Telehealth: Payer: Self-pay

## 2015-08-16 NOTE — Telephone Encounter (Signed)
Left message on pt VM regarding aspirin before procedure.  Left CB number w/questions

## 2015-08-16 NOTE — Telephone Encounter (Signed)
Pt is having procedure at [facility] on Wed 10/26. She has some questions regarding her medications. Please call.

## 2015-08-16 NOTE — Telephone Encounter (Signed)
Left message on machine for patient to contact the office.   

## 2015-08-18 ENCOUNTER — Ambulatory Visit (HOSPITAL_COMMUNITY)
Admission: RE | Admit: 2015-08-18 | Discharge: 2015-08-18 | Disposition: A | Discharge status: Home / Self Care | Payer: Medicare Other | Source: Ambulatory Visit | Attending: Cardiovascular Disease | Admitting: Cardiovascular Disease

## 2015-08-18 ENCOUNTER — Encounter (HOSPITAL_COMMUNITY)
Admission: RE | Disposition: A | Discharge status: Home / Self Care | Payer: Self-pay | Source: Ambulatory Visit | Attending: Cardiovascular Disease

## 2015-08-18 DIAGNOSIS — Z7982 Long term (current) use of aspirin: Secondary | ICD-10-CM | POA: Insufficient documentation

## 2015-08-18 DIAGNOSIS — K219 Gastro-esophageal reflux disease without esophagitis: Secondary | ICD-10-CM | POA: Diagnosis not present

## 2015-08-18 DIAGNOSIS — E039 Hypothyroidism, unspecified: Secondary | ICD-10-CM | POA: Diagnosis not present

## 2015-08-18 DIAGNOSIS — Z8249 Family history of ischemic heart disease and other diseases of the circulatory system: Secondary | ICD-10-CM | POA: Diagnosis not present

## 2015-08-18 DIAGNOSIS — I700 Atherosclerosis of aorta: Secondary | ICD-10-CM | POA: Diagnosis not present

## 2015-08-18 DIAGNOSIS — I150 Renovascular hypertension: Secondary | ICD-10-CM | POA: Insufficient documentation

## 2015-08-18 DIAGNOSIS — E7800 Pure hypercholesterolemia, unspecified: Secondary | ICD-10-CM | POA: Insufficient documentation

## 2015-08-18 DIAGNOSIS — I701 Atherosclerosis of renal artery: Secondary | ICD-10-CM | POA: Insufficient documentation

## 2015-08-18 SURGERY — ABDOMINAL ANGIOGRAM

## 2015-08-18 MED ORDER — SODIUM CHLORIDE 0.9 % IJ SOLN: 3.0000 mL | Freq: Two times a day (BID) | INTRAMUSCULAR | Status: DC

## 2015-08-18 MED ORDER — SODIUM CHLORIDE 0.9 % IJ SOLN: 3.0000 mL | INTRAMUSCULAR | Status: DC | PRN

## 2015-08-18 MED ORDER — FENTANYL CITRATE (PF) 100 MCG/2ML IJ SOLN
INTRAMUSCULAR | Status: AC
  Filled 2015-08-18: qty 4

## 2015-08-18 MED ORDER — ASPIRIN 81 MG PO CHEW
81.0000 mg | CHEWABLE_TABLET | ORAL | Status: AC
  Administered 2015-08-18: 81 mg via ORAL

## 2015-08-18 MED ORDER — LIDOCAINE HCL (PF) 1 % IJ SOLN
INTRAMUSCULAR | Status: DC | PRN
  Administered 2015-08-18: 12 mL

## 2015-08-18 MED ORDER — HEPARIN (PORCINE) IN NACL 2-0.9 UNIT/ML-% IJ SOLN
INTRAMUSCULAR | Status: AC
  Filled 2015-08-18: qty 1000

## 2015-08-18 MED ORDER — MIDAZOLAM HCL 2 MG/2ML IJ SOLN
INTRAMUSCULAR | Status: AC
  Filled 2015-08-18: qty 4

## 2015-08-18 MED ORDER — SODIUM CHLORIDE 0.9 % IV SOLN: 250.0000 mL | INTRAVENOUS | Status: DC | PRN

## 2015-08-18 MED ORDER — ASPIRIN 81 MG PO CHEW
CHEWABLE_TABLET | ORAL | Status: AC
  Filled 2015-08-18: qty 1

## 2015-08-18 MED ORDER — SODIUM CHLORIDE 0.9 % WEIGHT BASED INFUSION
3.0000 mL/kg/h | INTRAVENOUS | Status: DC
  Administered 2015-08-18: 3 mL/kg/h via INTRAVENOUS

## 2015-08-18 MED ORDER — ACETAMINOPHEN 325 MG PO TABS
650.0000 mg | ORAL_TABLET | Freq: Once | ORAL | Status: AC
  Administered 2015-08-18: 650 mg via ORAL
  Filled 2015-08-18: qty 2

## 2015-08-18 MED ORDER — HYDRALAZINE HCL 20 MG/ML IJ SOLN
INTRAMUSCULAR | Status: DC | PRN
  Administered 2015-08-18: 10 mg via INTRAVENOUS

## 2015-08-18 MED ORDER — ONDANSETRON HCL 4 MG/2ML IJ SOLN
INTRAMUSCULAR | Status: AC
  Filled 2015-08-18: qty 2

## 2015-08-18 MED ORDER — SODIUM CHLORIDE 0.9 % WEIGHT BASED INFUSION: 1.0000 mL/kg/h | INTRAVENOUS | Status: DC

## 2015-08-18 MED ORDER — MIDAZOLAM HCL 2 MG/2ML IJ SOLN
INTRAMUSCULAR | Status: DC | PRN
  Administered 2015-08-18: 1 mg via INTRAVENOUS

## 2015-08-18 MED ORDER — ONDANSETRON HCL 4 MG/2ML IJ SOLN
4.0000 mg | Freq: Once | INTRAMUSCULAR | Status: AC
  Administered 2015-08-18: 4 mg via INTRAVENOUS

## 2015-08-18 MED ORDER — LIDOCAINE HCL (PF) 1 % IJ SOLN
INTRAMUSCULAR | Status: AC
  Filled 2015-08-18: qty 30

## 2015-08-18 MED ORDER — ACETAMINOPHEN 325 MG PO TABS
ORAL_TABLET | ORAL | Status: AC
  Filled 2015-08-18: qty 2

## 2015-08-18 MED ORDER — HYDRALAZINE HCL 20 MG/ML IJ SOLN
INTRAMUSCULAR | Status: AC
  Filled 2015-08-18: qty 1

## 2015-08-18 MED ORDER — SODIUM CHLORIDE 0.9 % IV SOLN: INTRAVENOUS | Status: DC

## 2015-08-18 MED ORDER — FENTANYL CITRATE (PF) 100 MCG/2ML IJ SOLN
INTRAMUSCULAR | Status: DC | PRN
  Administered 2015-08-18: 50 ug via INTRAVENOUS

## 2015-08-18 SURGICAL SUPPLY — 9 items
CATH ANGIO 5F PIGTAIL 65CM (CATHETERS) ×4 IMPLANT
DEVICE CLOSURE MYNXGRIP 6/7F (Vascular Products) ×4 IMPLANT
KIT PV (KITS) ×4 IMPLANT
SHEATH PINNACLE 6F 10CM (SHEATH) ×4 IMPLANT
STOPCOCK MORSE 400PSI 3WAY (MISCELLANEOUS) ×4 IMPLANT
SYRINGE MEDRAD AVANTA MACH 7 (SYRINGE) ×4 IMPLANT
TRANSDUCER W/STOPCOCK (MISCELLANEOUS) ×4 IMPLANT
TRAY PV CATH (CUSTOM PROCEDURE TRAY) ×4 IMPLANT
WIRE HI TORQ VERSACORE-J 145CM (WIRE) ×4 IMPLANT

## 2015-08-18 NOTE — H&P (View-Only) (Signed)
Primary care physician: Dr. Einar Pheasant  HPI  This is a pleasant 74 year old female who is here today for a follow-up visit regarding refractory  hypertension and recently diagnosed renal artery stenosis. She has no previous cardiac history. Other medical problems include hyperlipidemia and GERD.  Blood pressure has been increasing over baseline since February of this year. This required increasing her antihypertensives medications.  Renal artery duplex was performed showed significant greater than 60% stenosis in the left renal artery and moderate right renal artery stenosis. She did not tolerate higher doses of labetalol. Thus, I switched her to carvedilol. She is also on lisinopril and hydrochlorothiazide. Her blood pressure continues to be significantly elevated with systolic blood pressure consistently above 413 systolic and occasionally close to 200. She feels extremely fatigued and tired with this.  Allergies  Allergen Reactions  . Meloxicam     BP issues     Current Outpatient Prescriptions on File Prior to Visit  Medication Sig Dispense Refill  . aspirin 81 MG tablet Take 81 mg by mouth daily.    . Calcium Carbonate-Vitamin D (CALTRATE 600+D) 600-400 MG-UNIT per tablet Take 1 tablet by mouth 2 (two) times daily.    . carvedilol (COREG) 6.25 MG tablet Take 1 tablet (6.25 mg total) by mouth 2 (two) times daily. 60 tablet 3  . Glucosamine-Chondroitin ([employer] GLUCOSAMINE-CHONDROITIN) 750-600 MG TABS Take 600 mg by mouth 2 (two) times daily.    . hydrochlorothiazide (HYDRODIURIL) 25 MG tablet TAKE 1 TABLET DAILY 90 tablet 4  . levothyroxine (SYNTHROID, LEVOTHROID) 88 MCG tablet TAKE 1 TABLET DAILY 90 tablet 1  . lisinopril (PRINIVIL,ZESTRIL) 40 MG tablet TAKE 1 TABLET (40 MG TOTAL) BY MOUTH DAILY. 30 tablet 2  . Multiple Vitamin (MULTIVITAMIN) tablet Take 1 tablet by mouth daily.    Marland Kitchen omeprazole (PRILOSEC) 20 MG capsule TAKE 1 CAPSULE DAILY 90 capsule 1  . potassium chloride SA  (K-DUR,KLOR-CON) 20 MEQ tablet TAKE 1 TABLET DAILY 90 tablet 1  . simvastatin (ZOCOR) 10 MG tablet TAKE 1 TABLET AT BEDTIME 90 tablet 1   No current facility-administered medications on file prior to visit.     Past Medical History  Diagnosis Date  . Hypertension   . Hypercholesterolemia   . Esophagitis     s/p esophageal dilatation  . Hypothyroidism   . Endometriosis   . History of ovarian cyst   . Arthritis   . GERD (gastroesophageal reflux disease)   . Chicken pox      Past Surgical History  Procedure Laterality Date  . Abdominal surgery      found to have an ovarian cyst and endometriosis  . Appendectomy       Family History  Problem Relation Age of Onset  . Lung disease Father   . Heart attack Father   . Heart disease Mother     myocardial infarction  . Breast cancer Mother   . Heart attack Mother   . Breast cancer Sister   . Breast cancer Maternal Aunt   . Colon cancer Neg Hx      Social History   Social History  . Marital Status: Married    Spouse Name: N/A  . Number of Children: N/A  . Years of Education: N/A   Occupational History  . Not on file.   Social History Main Topics  . Smoking status: Never Smoker   . Smokeless tobacco: Never Used  . Alcohol Use: No  . Drug Use: No  . Sexual Activity: Not  on file   Other Topics Concern  . Not on file   Social History Narrative     ROS A 10 point review of system was performed. It is negative other than that mentioned in the history of present illness.   PHYSICAL EXAM   BP 160/90 mmHg  Pulse 65  Ht 5' 2.5" (1.588 m)  Wt 132 lb 2 oz (59.932 kg)  BMI 23.77 kg/m2  LMP 10/21/1984 Constitutional: She is oriented to person, place, and time. She appears well-developed and well-nourished. No distress.  HENT: No nasal discharge.  Head: Normocephalic and atraumatic.  Eyes: Pupils are equal and round. No discharge.  Neck: Normal range of motion. Neck supple. No JVD present. No thyromegaly  present.  Cardiovascular: Normal rate, regular rhythm, normal heart sounds. Exam reveals no gallop and no friction rub. No murmur heard.  Pulmonary/Chest: Effort normal and breath sounds normal. No stridor. No respiratory distress. She has no wheezes. She has no rales. She exhibits no tenderness.  Abdominal: Soft. Bowel sounds are normal. She exhibits no distension. There is no tenderness. There is no rebound and no guarding.  Musculoskeletal: Normal range of motion. She exhibits no edema and no tenderness.  Neurological: She is alert and oriented to person, place, and time. Coordination normal.  Skin: Skin is warm and dry. No rash noted. She is not diaphoretic. No erythema. No pallor.  Psychiatric: She has a normal mood and affect. Her behavior is normal. Judgment and thought content normal.      ASSESSMENT AND PLAN

## 2015-08-18 NOTE — Interval H&P Note (Signed)
History and Physical Interval Note:  08/18/2015 12:58 PM  Crystal Lynch  has presented today for surgery, with the diagnosis of renal htn  The various methods of treatment have been discussed with the patient and family. After consideration of risks, benefits and other options for treatment, the patient has consented to  Procedure(s): Renal Angiography with possible angioplasty (N/A) as a surgical intervention .  The patient's history has been reviewed, patient examined, no change in status, stable for surgery.  I have reviewed the patient's chart and labs.  Questions were answered to the patient's satisfaction.     Kathlyn Sacramento

## 2015-08-18 NOTE — Discharge Instructions (Signed)
Angiogram, Care After °Refer to this sheet in the next few weeks. These instructions provide you with information about caring for yourself after your procedure. Your health care provider may also give you more specific instructions. Your treatment has been planned according to current medical practices, but problems sometimes occur. Call your health care provider if you have any problems or questions after your procedure. °WHAT TO EXPECT AFTER THE PROCEDURE °After your procedure, it is typical to have the following: °· Bruising at the catheter insertion site that usually fades within 1-2 weeks. °· Blood collecting in the tissue (hematoma) that may be painful to the touch. It should usually decrease in size and tenderness within 1-2 weeks. °HOME CARE INSTRUCTIONS °· Take medicines only as directed by your health care provider. °· You may shower 24-48 hours after the procedure or as directed by your health care provider. Remove the bandage (dressing) and gently wash the site with plain soap and water. Pat the area dry with a clean towel. Do not rub the site, because this may cause bleeding. °· Do not take baths, swim, or use a hot tub until your health care provider approves. °· Check your insertion site every day for redness, swelling, or drainage. °· Do not apply powder or lotion to the site. °· Do not lift over 10 lb (4.5 kg) for 5 days after your procedure or as directed by your health care provider. °· Ask your health care provider when it is okay to: °¨ Return to work or school. °¨ Resume usual physical activities or sports. °¨ Resume sexual activity. °· Do not drive home if you are discharged the same day as the procedure. Have someone else drive you. °· You may drive 24 hours after the procedure unless otherwise instructed by your health care provider. °· Do not operate machinery or power tools for 24 hours after the procedure or as directed by your health care provider. °· If your procedure was done as an  outpatient procedure, which means that you went home the same day as your procedure, a responsible adult should be with you for the first 24 hours after you arrive home. °· Keep all follow-up visits as directed by your health care provider. This is important. °SEEK MEDICAL CARE IF: °· You have a fever. °· You have chills. °· You have increased bleeding from the catheter insertion site. Hold pressure on the site. °SEEK IMMEDIATE MEDICAL CARE IF: °· You have unusual pain at the catheter insertion site. °· You have redness, warmth, or swelling at the catheter insertion site. °· You have drainage (other than a small amount of blood on the dressing) from the catheter insertion site. °· The catheter insertion site is bleeding, and the bleeding does not stop after 30 minutes of holding steady pressure on the site. °· The area near or just beyond the catheter insertion site becomes pale, cool, tingly, or numb. °  °This information is not intended to replace advice given to you by your health care provider. Make sure you discuss any questions you have with your health care provider. °  °Document Released: 04/27/2005 Document Revised: 10/30/2014 Document Reviewed: 03/12/2013 °Elsevier Interactive Patient Education ©2016 [employer] ° °

## 2015-08-19 ENCOUNTER — Telehealth: Payer: Self-pay | Admitting: *Deleted

## 2015-08-19 NOTE — Telephone Encounter (Signed)
Patient had a angiogram yesterday at [facility] with no blockages.  Dr. Fletcher Anon wants her to go on another bp medicine. Patient isn't sure which one and wants to know when is he wants her to start the new medication. Please call patient.

## 2015-08-20 ENCOUNTER — Other Ambulatory Visit: Payer: Self-pay

## 2015-08-20 DIAGNOSIS — I159 Secondary hypertension, unspecified: Secondary | ICD-10-CM

## 2015-08-20 MED ORDER — SPIRONOLACTONE 25 MG PO TABS: 25.0000 mg | ORAL_TABLET | Freq: Every day | ORAL | Status: DC

## 2015-08-20 NOTE — Telephone Encounter (Signed)
S/w pt who states after renal angiogram this week, Arida mentioned to her husband of a new BP medication. She would like to know if he can start her on this before her 11/7 OV. Reports BP 185/82 at 7:30am. She has since taken her morning medications.  She is concerned that BP continues to remain high. Forward to MD

## 2015-08-20 NOTE — Telephone Encounter (Signed)
S/w pt of Dr. Tyrell Antonio recommendations.  Pt verbalized understanding to start spironlactone and stop potassium. Labs appt 11/2 at 8:40 Prescription submitted

## 2015-08-20 NOTE — Telephone Encounter (Signed)
Start Spironolactone 25 mg once daily. Stop Potassium Chloride. Check BMP in 5 days.

## 2015-08-25 ENCOUNTER — Other Ambulatory Visit (INDEPENDENT_AMBULATORY_CARE_PROVIDER_SITE_OTHER): Payer: Medicare Other

## 2015-08-25 DIAGNOSIS — I159 Secondary hypertension, unspecified: Secondary | ICD-10-CM | POA: Diagnosis not present

## 2015-08-26 LAB — BASIC METABOLIC PANEL
BUN / CREAT RATIO: 15 (ref 11–26)
BUN: 11 mg/dL (ref 8–27)
CO2: 25 mmol/L (ref 18–29)
CREATININE: 0.72 mg/dL (ref 0.57–1.00)
Calcium: 10.1 mg/dL (ref 8.7–10.3)
Chloride: 93 mmol/L — ABNORMAL LOW (ref 97–106)
GFR calc Af Amer: 95 mL/min/{1.73_m2} (ref >59)
GFR, EST NON AFRICAN AMERICAN: 82 mL/min/{1.73_m2} (ref >59)
Glucose: 97 mg/dL (ref 65–99)
Potassium: 4.8 mmol/L (ref 3.5–5.2)
SODIUM: 134 mmol/L — AB (ref 136–144)

## 2015-08-30 ENCOUNTER — Ambulatory Visit (INDEPENDENT_AMBULATORY_CARE_PROVIDER_SITE_OTHER): Payer: Medicare Other | Admitting: Cardiovascular Disease

## 2015-08-30 ENCOUNTER — Encounter: Payer: Self-pay | Admitting: Cardiovascular Disease

## 2015-08-30 VITALS — BP 120/72 | HR 77 | Ht 62.5 in | Wt 132.0 lb

## 2015-08-30 DIAGNOSIS — I10 Essential (primary) hypertension: Secondary | ICD-10-CM

## 2015-08-30 NOTE — Assessment & Plan Note (Signed)
Angiography showed no significant renal artery stenosis. She does have refractory hypertension and improved significantly after the addition of spironolactone. Basic metabolic profile showed normal potassium. Continue current medications and monitor blood pressure at home. If blood pressure goes down any further, we should consider stopping carvedilol or decreasing the dose.

## 2015-08-30 NOTE — Patient Instructions (Signed)
Medication Instructions: Continue same medications.   Labwork: None.   Procedures/Testing: None.   Follow-Up: 3 months with Dr. Fletcher Anon  Any Additional Special Instructions Will Be Listed Below (If Applicable).  Bring your blood pressure machine with your next visit.

## 2015-08-30 NOTE — Progress Notes (Signed)
Primary care physician: Dr. Einar Pheasant  HPI  This is a pleasant 73 year old female who is here today for a follow-up visit regarding refractory  hypertension . She has no previous cardiac history. Other medical problems include hyperlipidemia and GERD.  Blood pressure has been increasing over baseline since February of this year. This required increasing her antihypertensives medications.  Renal artery duplex was performed showed significant greater than 60% stenosis in the left renal artery and moderate right renal artery stenosis. She continued to have uncontrolled hypertension and thus I proceeded with renal artery angiography which showed no evidence of significant obstructive disease. The left renal artery was found to be very tortuous in the mid and distal segment which was likely the cause of false-positive duplex. Since then, I started her on spironolactone with significant improvement in blood pressure control.   Allergies  Allergen Reactions  . Meloxicam     BP issues     Current Outpatient Prescriptions on File Prior to Visit  Medication Sig Dispense Refill  . aspirin 81 MG tablet Take 81 mg by mouth daily.    . Calcium Carbonate-Vitamin D (CALTRATE 600+D) 600-400 MG-UNIT per tablet Take 1 tablet by mouth 2 (two) times daily.    . carvedilol (COREG) 6.25 MG tablet Take 1 tablet (6.25 mg total) by mouth 2 (two) times daily. 60 tablet 3  . Glucosamine-Chondroitin ([employer]S GLUCOSAMINE-CHONDROITIN) 750-600 MG TABS Take 600 mg by mouth 2 (two) times daily.    . hydrochlorothiazide (HYDRODIURIL) 25 MG tablet TAKE 1 TABLET DAILY 90 tablet 4  . levothyroxine (SYNTHROID, LEVOTHROID) 88 MCG tablet TAKE 1 TABLET DAILY 90 tablet 1  . lisinopril (PRINIVIL,ZESTRIL) 40 MG tablet TAKE 1 TABLET (40 MG TOTAL) BY MOUTH DAILY. 30 tablet 2  . Multiple Vitamin (MULTIVITAMIN) tablet Take 1 tablet by mouth daily.    Marland Kitchen omeprazole (PRILOSEC) 20 MG capsule TAKE 1 CAPSULE DAILY 90 capsule 1  .  simvastatin (ZOCOR) 10 MG tablet TAKE 1 TABLET AT BEDTIME 90 tablet 1  . spironolactone (ALDACTONE) 25 MG tablet Take 1 tablet (25 mg total) by mouth daily. 30 tablet 2   No current facility-administered medications on file prior to visit.     Past Medical History  Diagnosis Date  . Hypertension   . Hypercholesterolemia   . Esophagitis     s/p esophageal dilatation  . Hypothyroidism   . Endometriosis   . History of ovarian cyst   . Arthritis   . GERD (gastroesophageal reflux disease)   . Chicken pox      Past Surgical History  Procedure Laterality Date  . Abdominal surgery      found to have an ovarian cyst and endometriosis  . Appendectomy       Family History  Problem Relation Age of Onset  . Lung disease Father   . Heart attack Father   . Heart disease Mother     myocardial infarction  . Breast cancer Mother   . Heart attack Mother   . Breast cancer Sister   . Breast cancer Maternal Aunt   . Colon cancer Neg Hx      Social History   Social History  . Marital Status: Married    Spouse Name: N/A  . Number of Children: N/A  . Years of Education: N/A   Occupational History  . Not on file.   Social History Main Topics  . Smoking status: Never Smoker   . Smokeless tobacco: Never Used  . Alcohol Use: No  .  Drug Use: No  . Sexual Activity: Not on file   Other Topics Concern  . Not on file   Social History Narrative     ROS A 10 point review of system was performed. It is negative other than that mentioned in the history of present illness.   PHYSICAL EXAM   BP 120/72 mmHg  Pulse 77  Ht 5' 2.5" (1.588 m)  Wt 132 lb (59.875 kg)  BMI 23.74 kg/m2  LMP 10/21/1984 Constitutional: She is oriented to person, place, and time. She appears well-developed and well-nourished. No distress.  HENT: No nasal discharge.  Head: Normocephalic and atraumatic.  Eyes: Pupils are equal and round. No discharge.  Neck: Normal range of motion. Neck supple. No JVD  present. No thyromegaly present.  Cardiovascular: Normal rate, regular rhythm, normal heart sounds. Exam reveals no gallop and no friction rub. No murmur heard.  Pulmonary/Chest: Effort normal and breath sounds normal. No stridor. No respiratory distress. She has no wheezes. She has no rales. She exhibits no tenderness.  Abdominal: Soft. Bowel sounds are normal. She exhibits no distension. There is no tenderness. There is no rebound and no guarding.  Musculoskeletal: Normal range of motion. She exhibits no edema and no tenderness.  Neurological: She is alert and oriented to person, place, and time. Coordination normal.  Skin: Skin is warm and dry. No rash noted. She is not diaphoretic. No erythema. No pallor.  Psychiatric: She has a normal mood and affect. Her behavior is normal. Judgment and thought content normal.  Right groin is intact with no hematoma. There is a small scar tissue from the closure device.    ASSESSMENT AND PLAN

## 2015-09-20 ENCOUNTER — Ambulatory Visit: Payer: Medicare Other | Admitting: Cardiovascular Disease

## 2015-10-04 ENCOUNTER — Other Ambulatory Visit: Payer: Self-pay | Admitting: Internal Medicine

## 2015-10-08 ENCOUNTER — Ambulatory Visit: Payer: Medicare Other | Admitting: Cardiovascular Disease

## 2015-10-17 ENCOUNTER — Other Ambulatory Visit: Payer: Self-pay | Admitting: Internal Medicine

## 2015-11-02 ENCOUNTER — Telehealth: Payer: Self-pay | Admitting: *Deleted

## 2015-11-02 ENCOUNTER — Other Ambulatory Visit: Payer: Self-pay | Admitting: *Deleted

## 2015-11-02 ENCOUNTER — Other Ambulatory Visit: Payer: Self-pay | Admitting: Internal Medicine

## 2015-11-02 MED ORDER — SIMVASTATIN 10 MG PO TABS: 10.0000 mg | ORAL_TABLET | Freq: Every day | ORAL | Status: DC

## 2015-11-02 MED ORDER — CARVEDILOL 6.25 MG PO TABS: 6.2500 mg | ORAL_TABLET | Freq: Two times a day (BID) | ORAL | Status: DC

## 2015-11-02 NOTE — Telephone Encounter (Signed)
Requested Prescriptions   Signed Prescriptions Disp Refills  . carvedilol (COREG) 6.25 MG tablet 60 tablet 3    Sig: Take 1 tablet (6.25 mg total) by mouth 2 (two) times daily.    Authorizing Provider: Kathlyn Sacramento A    Ordering User: Othelia Pulling C  . simvastatin (ZOCOR) 10 MG tablet 90 tablet 3    Sig: Take 1 tablet (10 mg total) by mouth at bedtime.    Authorizing Provider: Kathlyn Sacramento A    Ordering User: Britt Bottom

## 2015-11-02 NOTE — Telephone Encounter (Signed)
Pt would like to know if she is to continue this and if so please send to below.   *STAT* If patient is at the pharmacy, call can be transferred to refill team.   1. Which medications need to be refilled? (please list name of each medication and dose if known)  Carvedilol  Simvastatin   2. Which pharmacy/location (including street and city if local pharmacy) is medication to be sent to? [facility]   3. Do they need a 30 day or 90 day supply? 90 day

## 2015-11-04 ENCOUNTER — Other Ambulatory Visit: Payer: Self-pay

## 2015-11-04 MED ORDER — SPIRONOLACTONE 25 MG PO TABS: 25.0000 mg | ORAL_TABLET | Freq: Every day | ORAL | Status: DC

## 2015-11-04 NOTE — Telephone Encounter (Signed)
Refill sent for spironolactone 25 mg  

## 2015-11-19 ENCOUNTER — Telehealth: Payer: Self-pay | Admitting: *Deleted

## 2015-11-19 NOTE — Telephone Encounter (Signed)
Patient returning call from Sharon.  °

## 2015-11-19 NOTE — Telephone Encounter (Signed)
S/w pt who reports SBP normally 112-120s.  She takes morning meds around 8am. This morning she was out shopping and did not feel well. Upon returning home around 11:30am, she took BP and reports 74/54. Recheck at 4pm, 113/64. States she feels better at this time. Advised pt to monitor BP and HR this weekend. Take VS before morning meds and hour after. Record readings and report to [place]. Instructed pt to hold BP med if SBP <100. Pt verbalized understanding and is agreeable w/plan. Pt had no further questions.

## 2015-11-19 NOTE — Telephone Encounter (Signed)
Left message on machine for patient to contact the office.   

## 2015-11-19 NOTE — Telephone Encounter (Signed)
Pt c/o medication issue:  1. Name of Medication:  Carvedilol 2x a day 6.25 Hydrochlorothiazide 1x a day 25 mg Spironolactone 1x a day 25 mg  lisinopril 1x a day 40 mg   2. How are you currently taking this medication (dosage and times per day)?  All in the morning and then the second carvediol after Lunch  3. Are you having a reaction (difficulty breathing--STAT)?    4. What is your medication issue? Thinks she may be on too many.  This morning74/54   Past two mornings 112/56, 108/60  Please advise

## 2015-11-23 ENCOUNTER — Telehealth: Payer: Self-pay | Admitting: Cardiovascular Disease

## 2015-11-23 NOTE — Telephone Encounter (Signed)
Per request bp readings after recent med changes :   01/28   7 am 143/74   10 am 141/65    1 pm   110/61     4 pm 124/69       01/29    8 am 137/72   1230  Pm 125/66   4 pm 136/70      01/30   8 am  122/75   11 am   100/68      4 pm   116/64    01/31    8 am 119/69    1030  Am 138/72    2 pm  115/62      Please call if needing to discuss before upcoming appt.

## 2015-11-24 NOTE — Telephone Encounter (Signed)
S/w pt who reported the BP results listed on Jan 31.  She had called on Jan 27 to report a low BP. At that time, she was advised to monitor BP and report several days readings to [place]. States she feels well and has not had any more episodes of hypotension. Instructed pt to continue taking all meds as scheduled and confirmed Feb 10 OV w/Dr. Fletcher Anon. Pt verbalized understanding and is agreeable w/plan.

## 2015-12-03 ENCOUNTER — Ambulatory Visit (INDEPENDENT_AMBULATORY_CARE_PROVIDER_SITE_OTHER): Payer: Medicare Other | Admitting: Cardiovascular Disease

## 2015-12-03 ENCOUNTER — Encounter: Payer: Self-pay | Admitting: Cardiovascular Disease

## 2015-12-03 VITALS — BP 96/60 | HR 77 | Ht 62.5 in | Wt 133.5 lb

## 2015-12-03 DIAGNOSIS — I159 Secondary hypertension, unspecified: Secondary | ICD-10-CM | POA: Diagnosis not present

## 2015-12-03 MED ORDER — HYDROCHLOROTHIAZIDE 25 MG PO TABS: 12.5000 mg | ORAL_TABLET | Freq: Every day | ORAL | Status: DC

## 2015-12-03 NOTE — Patient Instructions (Signed)
Medication Instructions:  Your physician has recommended you make the following change in your medication:  DECREASE HCTZ to 12.5mg  (1/2 tablet) once daily    Labwork: BMET  Testing/Procedures: none  Follow-Up: Your physician wants you to follow-up in: six months with Dr. Fletcher Anon.  You will receive a reminder letter in the mail two months in advance. If you don't receive a letter, please call our office to schedule the follow-up appointment.   Any Other Special Instructions Will Be Listed Below (If Applicable).     If you need a refill on your cardiac medications before your next appointment, please call your pharmacy.

## 2015-12-03 NOTE — Assessment & Plan Note (Signed)
Blood pressure is much better controlled after the addition of spironolactone. However, she is having frequent episodes of symptomatic hypotension. Thus, I decreased the dose of hydrochlorothiazide to 12.5 mg once daily. Continue to monitor blood pressure. Check basic metabolic profile today to ensure no hyperkalemia. Follow-up in 6 months or earlier if needed.

## 2015-12-03 NOTE — Progress Notes (Signed)
Primary care physician: Dr. Einar Pheasant  HPI  This is a pleasant 75 year old female who is here today for a follow-up visit regarding refractory  hypertension . She has no previous cardiac history. Other medical problems include hyperlipidemia and GERD.   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 However, renal artery angiography  showed no evidence of significant obstructive disease. The left renal artery was found to be very tortuous in the mid and distal segment which was likely the cause of false-positive duplex.  Blood pressure improved significantly with spironolactone. However, she is having some low blood pressure readings with dizziness. Otherwise she is feeling well and denies any chest pain or shortness of breath.  Allergies  Allergen Reactions  . Meloxicam     BP issues     Current Outpatient Prescriptions on File Prior to Visit  Medication Sig Dispense Refill  . aspirin 81 MG tablet Take 81 mg by mouth daily.    . Calcium Carbonate-Vitamin D (CALTRATE 600+D) 600-400 MG-UNIT per tablet Take 1 tablet by mouth 2 (two) times daily.    . carvedilol (COREG) 6.25 MG tablet Take 1 tablet (6.25 mg total) by mouth 2 (two) times daily. 60 tablet 3  . Glucosamine-Chondroitin ([facility] GLUCOSAMINE-CHONDROITIN) 750-600 MG TABS Take 600 mg by mouth 2 (two) times daily.    Marland Kitchen levothyroxine (SYNTHROID, LEVOTHROID) 88 MCG tablet TAKE 1 TABLET DAILY 90 tablet 2  . lisinopril (PRINIVIL,ZESTRIL) 40 MG tablet TAKE 1 TABLET DAILY 90 tablet 3  . Multiple Vitamin (MULTIVITAMIN) tablet Take 1 tablet by mouth daily.    Marland Kitchen omeprazole (PRILOSEC) 20 MG capsule TAKE 1 CAPSULE DAILY 90 capsule 0  . simvastatin (ZOCOR) 10 MG tablet Take 1 tablet (10 mg total) by mouth at bedtime. 90 tablet 3  . spironolactone (ALDACTONE) 25 MG tablet Take 1 tablet (25 mg total) by mouth daily. 90 tablet 3   No current  facility-administered medications on file prior to visit.     Past Medical History  Diagnosis Date  . Hypertension   . Hypercholesterolemia   . Esophagitis     s/p esophageal dilatation  . Hypothyroidism   . Endometriosis   . History of ovarian cyst   . Arthritis   . GERD (gastroesophageal reflux disease)   . Chicken pox      Past Surgical History  Procedure Laterality Date  . Abdominal surgery      found to have an ovarian cyst and endometriosis  . Appendectomy       Family History  Problem Relation Age of Onset  . Lung disease Father   . Heart attack Father   . Heart disease Mother     myocardial infarction  . Breast cancer Mother   . Heart attack Mother   . Breast cancer Sister   . Breast cancer Maternal Aunt   . Colon cancer Neg Hx      Social History   Social History  . Marital Status: Married    Spouse Name: N/A  . Number of Children: N/A  . Years of Education: N/A   Occupational History  . Not on file.   Social History Main Topics  . Smoking status: Never Smoker   . Smokeless tobacco: Never Used  . Alcohol Use: No  . Drug Use: No  . Sexual Activity: Not on file   Other Topics Concern  . Not on file   Social History Narrative  ROS A 10 point review of system was performed. It is negative other than that mentioned in the history of present illness.   PHYSICAL EXAM   BP 96/60 mmHg  Pulse 77  Ht 5' 2.5" (1.588 m)  Wt 133 lb 8 oz (60.555 kg)  BMI 24.01 kg/m2  LMP 10/21/1984 Constitutional: She is oriented to person, place, and time. She appears well-developed and well-nourished. No distress.  HENT: No nasal discharge.  Head: Normocephalic and atraumatic.  Eyes: Pupils are equal and round. No discharge.  Neck: Normal range of motion. Neck supple. No JVD present. No thyromegaly present.  Cardiovascular: Normal rate, regular rhythm, normal heart sounds. Exam reveals no gallop and no friction rub. No murmur heard.  Pulmonary/Chest:  Effort normal and breath sounds normal. No stridor. No respiratory distress. She has no wheezes. She has no rales. She exhibits no tenderness.  Abdominal: Soft. Bowel sounds are normal. She exhibits no distension. There is no tenderness. There is no rebound and no guarding.  Musculoskeletal: Normal range of motion. She exhibits no edema and no tenderness.  Neurological: She is alert and oriented to person, place, and time. Coordination normal.  Skin: Skin is warm and dry. No rash noted. She is not diaphoretic. No erythema. No pallor.  Psychiatric: She has a normal mood and affect. Her behavior is normal. Judgment and thought content normal.       ASSESSMENT AND PLAN

## 2015-12-04 LAB — BASIC METABOLIC PANEL
BUN/Creatinine Ratio: 21 (ref 11–26)
BUN: 19 mg/dL (ref 8–27)
CALCIUM: 9.9 mg/dL (ref 8.7–10.3)
CHLORIDE: 91 mmol/L — AB (ref 96–106)
CO2: 20 mmol/L (ref 18–29)
Creatinine, Ser: 0.9 mg/dL (ref 0.57–1.00)
GFR calc Af Amer: 72 mL/min/{1.73_m2} (ref >59)
GFR, EST NON AFRICAN AMERICAN: 63 mL/min/{1.73_m2} (ref >59)
Glucose: 92 mg/dL (ref 65–99)
Potassium: 4.6 mmol/L (ref 3.5–5.2)
Sodium: 129 mmol/L — ABNORMAL LOW (ref 134–144)

## 2015-12-05 ENCOUNTER — Other Ambulatory Visit: Payer: Self-pay | Admitting: Cardiovascular Disease

## 2016-01-05 ENCOUNTER — Encounter: Payer: Self-pay | Admitting: Internal Medicine

## 2016-01-05 ENCOUNTER — Other Ambulatory Visit (INDEPENDENT_AMBULATORY_CARE_PROVIDER_SITE_OTHER): Payer: Medicare Other

## 2016-01-05 DIAGNOSIS — E7800 Pure hypercholesterolemia, unspecified: Secondary | ICD-10-CM | POA: Diagnosis not present

## 2016-01-05 DIAGNOSIS — I10 Essential (primary) hypertension: Secondary | ICD-10-CM | POA: Diagnosis not present

## 2016-01-05 LAB — BASIC METABOLIC PANEL
BUN: 15 mg/dL (ref 6–23)
CHLORIDE: 104 meq/L (ref 96–112)
CO2: 28 meq/L (ref 19–32)
CREATININE: 0.8 mg/dL (ref 0.40–1.20)
Calcium: 10.1 mg/dL (ref 8.4–10.5)
GFR: 74.11 mL/min (ref >60.00)
GLUCOSE: 101 mg/dL — AB (ref 70–99)
POTASSIUM: 4 meq/L (ref 3.5–5.1)
Sodium: 138 mEq/L (ref 135–145)

## 2016-01-05 LAB — LIPID PANEL
Cholesterol: 153 mg/dL (ref 0–200)
HDL: 47.5 mg/dL (ref >39.00)
LDL Cholesterol: 73 mg/dL (ref 0–99)
NonHDL: 105.65
Total CHOL/HDL Ratio: 3
Triglycerides: 161 mg/dL — ABNORMAL HIGH (ref 0.0–149.0)
VLDL: 32.2 mg/dL (ref 0.0–40.0)

## 2016-01-05 LAB — HEPATIC FUNCTION PANEL
ALK PHOS: 59 U/L (ref 39–117)
ALT: 16 U/L (ref 0–35)
AST: 17 U/L (ref 0–37)
Albumin: 4.2 g/dL (ref 3.5–5.2)
BILIRUBIN DIRECT: 0.2 mg/dL (ref 0.0–0.3)
BILIRUBIN TOTAL: 0.8 mg/dL (ref 0.2–1.2)
TOTAL PROTEIN: 6.7 g/dL (ref 6.0–8.3)

## 2016-01-10 ENCOUNTER — Ambulatory Visit (INDEPENDENT_AMBULATORY_CARE_PROVIDER_SITE_OTHER): Payer: Medicare Other | Admitting: Internal Medicine

## 2016-01-10 ENCOUNTER — Encounter: Payer: Self-pay | Admitting: Internal Medicine

## 2016-01-10 VITALS — BP 100/70 | HR 79 | Temp 98.2°F | Resp 18 | Ht 62.5 in | Wt 134.2 lb

## 2016-01-10 DIAGNOSIS — I10 Essential (primary) hypertension: Secondary | ICD-10-CM | POA: Diagnosis not present

## 2016-01-10 DIAGNOSIS — K209 Esophagitis, unspecified without bleeding: Secondary | ICD-10-CM

## 2016-01-10 DIAGNOSIS — M8580 Other specified disorders of bone density and structure, unspecified site: Secondary | ICD-10-CM

## 2016-01-10 DIAGNOSIS — Z0000 Encounter for general adult medical examination without abnormal findings: Secondary | ICD-10-CM

## 2016-01-10 DIAGNOSIS — G479 Sleep disorder, unspecified: Secondary | ICD-10-CM

## 2016-01-10 DIAGNOSIS — Z1239 Encounter for other screening for malignant neoplasm of breast: Secondary | ICD-10-CM

## 2016-01-10 DIAGNOSIS — E039 Hypothyroidism, unspecified: Secondary | ICD-10-CM

## 2016-01-10 DIAGNOSIS — E7800 Pure hypercholesterolemia, unspecified: Secondary | ICD-10-CM

## 2016-01-10 NOTE — Progress Notes (Signed)
Pre-visit discussion using our clinic review tool. No additional management support is needed unless otherwise documented below in the visit note.  

## 2016-01-10 NOTE — Progress Notes (Signed)
Patient ID: Crystal Lynch, female   DOB: 02/26/1941, 75 y.o.   MRN: [id number]   Subjective:    Patient ID: Crystal Lynch, female    DOB: 27-May-1941, 75 y.o.   MRN: [id number]  HPI  Patient here for her physical exam.  She states recently had uti.  Symptoms clear now.  Has been seeing Dr Fletcher Anon.  Last seen 11/2015.  Had w/up for elevated blood pressure.  W/up for RAS.  Arteriography revealed no significant stenosis.  Was having problems initially with high blood pressure.  Better when aldactone added.  Now off hctz.  Blood pressure has been doing well on coreg, lisinopril and spironolactone.  No chest pain.  No sob.  No increased cough or congestion.  No acid reflux.  No abdominal pain or cramping.  Bowels stable.     Past Medical History  Diagnosis Date  . Hypertension   . Hypercholesterolemia   . Esophagitis     s/p esophageal dilatation  . Hypothyroidism   . Endometriosis   . History of ovarian cyst   . Arthritis   . GERD (gastroesophageal reflux disease)   . Chicken pox    Past Surgical History  Procedure Laterality Date  . Abdominal surgery      found to have an ovarian cyst and endometriosis  . Appendectomy     Family History  Problem Relation Age of Onset  . Lung disease Father   . Heart attack Father   . Heart disease Mother     myocardial infarction  . Breast cancer Mother   . Heart attack Mother   . Breast cancer Sister   . Breast cancer Maternal Aunt   . Colon cancer Neg Hx    Social History   Social History  . Marital Status: Married    Spouse Name: N/A  . Number of Children: N/A  . Years of Education: N/A   Social History Main Topics  . Smoking status: Never Smoker   . Smokeless tobacco: Never Used  . Alcohol Use: No  . Drug Use: No  . Sexual Activity: Not Asked   Other Topics Concern  . None   Social History Narrative    Outpatient Encounter Prescriptions as of 01/10/2016  Medication Sig  . aspirin 81 MG tablet Take 81 mg by mouth daily.    . Calcium Carbonate-Vitamin D (CALTRATE 600+D) 600-400 MG-UNIT per tablet Take 1 tablet by mouth 2 (two) times daily.  . carvedilol (COREG) 6.25 MG tablet Take 1 tablet (6.25 mg total) by mouth 2 (two) times daily.  . Glucosamine-Chondroitin ([facility] GLUCOSAMINE-CHONDROITIN) 750-600 MG TABS Take 600 mg by mouth 2 (two) times daily.  Marland Kitchen levothyroxine (SYNTHROID, LEVOTHROID) 88 MCG tablet TAKE 1 TABLET DAILY  . lisinopril (PRINIVIL,ZESTRIL) 40 MG tablet TAKE 1 TABLET DAILY  . Multiple Vitamin (MULTIVITAMIN) tablet Take 1 tablet by mouth daily.  Marland Kitchen omeprazole (PRILOSEC) 20 MG capsule TAKE 1 CAPSULE DAILY  . simvastatin (ZOCOR) 10 MG tablet Take 1 tablet (10 mg total) by mouth at bedtime.  Marland Kitchen spironolactone (ALDACTONE) 25 MG tablet Take 1 tablet (25 mg total) by mouth daily.  . [DISCONTINUED] hydrochlorothiazide (HYDRODIURIL) 25 MG tablet Take 0.5 tablets (12.5 mg total) by mouth daily.  . [DISCONTINUED] spironolactone (ALDACTONE) 25 MG tablet TAKE 1 TABLET (25 MG TOTAL) BY MOUTH DAILY.   No facility-administered encounter medications on file as of 01/10/2016.    Review of Systems  Constitutional: Negative for appetite change and unexpected weight change.  HENT: Negative  for congestion and sinus pressure.   Eyes: Negative for pain and visual disturbance.  Respiratory: Negative for cough, chest tightness and shortness of breath.   Cardiovascular: Negative for chest pain, palpitations and leg swelling.  Gastrointestinal: Negative for nausea, vomiting, abdominal pain and diarrhea.  Genitourinary: Negative for dysuria and difficulty urinating.  Musculoskeletal: Negative for back pain and joint swelling.  Skin: Negative for color change and rash.  Neurological: Negative for dizziness, light-headedness and headaches.  Hematological: Negative for adenopathy. Does not bruise/bleed easily.  Psychiatric/Behavioral: Negative for dysphoric mood and agitation.       Objective:     Blood pressure rechecked  by me prior to leaving:  134/82-84  Physical Exam  Constitutional: She is oriented to person, place, and time. She appears well-developed and well-nourished. No distress.  HENT:  Nose: Nose normal.  Mouth/Throat: Oropharynx is clear and moist.  Eyes: Right eye exhibits no discharge. Left eye exhibits no discharge. No scleral icterus.  Neck: Neck supple. No thyromegaly present.  Cardiovascular: Normal rate and regular rhythm.   Pulmonary/Chest: Breath sounds normal. No accessory muscle usage. No tachypnea. No respiratory distress. She has no decreased breath sounds. She has no wheezes. She has no rhonchi. Right breast exhibits no inverted nipple, no mass, no nipple discharge and no tenderness (no axillary adenopathy). Left breast exhibits no inverted nipple, no mass, no nipple discharge and no tenderness (no axilarry adenopathy).  Abdominal: Soft. Bowel sounds are normal. There is no tenderness.  Musculoskeletal: She exhibits no edema or tenderness.  Lymphadenopathy:    She has no cervical adenopathy.  Neurological: She is alert and oriented to person, place, and time.  Skin: Skin is warm. No rash noted. No erythema.  Psychiatric: She has a normal mood and affect. Her behavior is normal.    BP 100/70 mmHg  Pulse 79  Temp(Src) 98.2 F (36.8 C) (Oral)  Resp 18  Ht 5' 2.5" (1.588 m)  Wt 134 lb 4 oz (60.895 kg)  BMI 24.15 kg/m2  SpO2 97%  LMP 10/21/1984 Wt Readings from Last 3 Encounters:  01/10/16 134 lb 4 oz (60.895 kg)  12/03/15 133 lb 8 oz (60.555 kg)  08/30/15 132 lb (59.875 kg)     Lab Results  Component Value Date   WBC 6.7 08/12/2015   HGB 14.6 11/09/2014   HCT 42.2 08/12/2015   PLT 267 08/12/2015   GLUCOSE 101* 01/05/2016   CHOL 153 01/05/2016   TRIG 161.0* 01/05/2016   HDL 47.50 01/05/2016   LDLCALC 73 01/05/2016   ALT 16 01/05/2016   AST 17 01/05/2016   NA 138 01/05/2016   K 4.0 01/05/2016   CL 104 01/05/2016   CREATININE 0.80 01/05/2016   BUN 15 01/05/2016    CO2 28 01/05/2016   TSH 1.42 11/09/2014   INR 1.0 08/12/2015       Assessment & Plan:   Problem List Items Addressed This Visit    Esophagitis    On omeprazole.  No upper symptoms reported.        Health care maintenance    Physical today 01/13/16.  Colonoscopy 09/05/12.  Mammogram 11/26/14 - Birads I.  Schedule f/u mammogram.  Last mammogram 2015 - stable.  Follow.        Hyperbilirubinemia    Previous bilirubin wnl.  Follow.        Hypercholesterolemia    On simvastatin.  Low cholesterol diet and exercise.  Follow lipid panel and liver function tests.    Lab Results  Component Value Date   CHOL 153 01/05/2016   HDL 47.50 01/05/2016   LDLCALC 73 01/05/2016   TRIG 161.0* 01/05/2016   CHOLHDL 3 01/05/2016        Relevant Orders   Lipid panel   Hepatic function panel   Hypertension    Blood pressure has been doing better on current regimen.  Follow pressures.  Follow metabolic panel.        Relevant Orders   CBC with Differential/Platelet   Basic metabolic panel   Hypothyroidism    On thyroid replacement.  Follow tsh.       Relevant Orders   TSH   Osteopenia   Relevant Orders   VITAMIN D 25 Hydroxy (Vit-D Deficiency, Fractures)   Sleeping difficulties    Sleeping better now.  Not taking trazodone.  Takes benadryl.  Helping with her allergies.         Other Visit Diagnoses    Routine general medical examination at a health care facility    -  Primary    Screening breast examination        Relevant Orders    MM DIGITAL SCREENING BILATERAL        Einar Pheasant, MD

## 2016-01-11 ENCOUNTER — Encounter: Payer: Self-pay | Admitting: Internal Medicine

## 2016-01-11 NOTE — Assessment & Plan Note (Signed)
Previous bilirubin wnl.  Follow.

## 2016-01-11 NOTE — Assessment & Plan Note (Signed)
Sleeping better now.  Not taking trazodone.  Takes benadryl.  Helping with her allergies.

## 2016-01-11 NOTE — Assessment & Plan Note (Signed)
On thyroid replacement.  Follow tsh.  

## 2016-01-11 NOTE — Assessment & Plan Note (Signed)
Physical today 01/13/16.  Colonoscopy 09/05/12.  Mammogram 11/26/14 - Birads I.  Schedule f/u mammogram.  Last mammogram 2015 - stable.  Follow.

## 2016-01-11 NOTE — Assessment & Plan Note (Addendum)
On simvastatin.  Low cholesterol diet and exercise.  Follow lipid panel and liver function tests.    Lab Results  Component Value Date   CHOL 153 01/05/2016   HDL 47.50 01/05/2016   LDLCALC 73 01/05/2016   TRIG 161.0* 01/05/2016   CHOLHDL 3 01/05/2016

## 2016-01-11 NOTE — Assessment & Plan Note (Signed)
Blood pressure has been doing better on current regimen.  Follow pressures.  Follow metabolic panel.

## 2016-01-11 NOTE — Assessment & Plan Note (Signed)
On omeprazole.  No upper symptoms reported.   

## 2016-01-14 ENCOUNTER — Other Ambulatory Visit: Payer: Self-pay | Admitting: Internal Medicine

## 2016-01-24 ENCOUNTER — Telehealth: Payer: Self-pay | Admitting: Cardiovascular Disease

## 2016-01-24 NOTE — Telephone Encounter (Signed)
S/w pt who reports elevated BP x 2 days 160/87 HR 70 yesterday; 181/95 HR 72 this morning BP at 2:30pm today 134/77 after taking medications. She is asymptomatic however, is concerned because her mother died of an MI "in her 86s" She is compliant w/medications. No missed doses. HCTZ d/c'd 2/16 after labs showed low sodium level. Advised pt to follow low sodium diet, continue medications, monitor BP before and after BP medications x 4 days and call w/readings. She verbalized understanding with no further questions. She will call Friday with readings

## 2016-01-24 NOTE — Telephone Encounter (Signed)
°  Pt c/o BP issue: STAT if pt c/o blurred vision, one-sided weakness or slurred speech  1. What are your last 5 BP readings?  181/95   Hr  72  this morning  160/87  Hr 70    Yesterday morning    2. Are you having any other symptoms (ex. Dizziness, headache, blurred vision, passed out)? Body feels nervous , headache (?sinus)  3. What is your BP issue? Recent bp med changes wants to talk to sharon to see if she should take extra medicine

## 2016-01-25 ENCOUNTER — Other Ambulatory Visit: Payer: Self-pay | Admitting: Internal Medicine

## 2016-01-25 ENCOUNTER — Ambulatory Visit
Admission: RE | Admit: 2016-01-25 | Discharge: 2016-01-25 | Disposition: A | Discharge status: Home / Self Care | Payer: Medicare Other | Source: Ambulatory Visit | Attending: Internal Medicine | Admitting: Internal Medicine

## 2016-01-25 DIAGNOSIS — Z1231 Encounter for screening mammogram for malignant neoplasm of breast: Secondary | ICD-10-CM | POA: Insufficient documentation

## 2016-01-25 DIAGNOSIS — Z1239 Encounter for other screening for malignant neoplasm of breast: Secondary | ICD-10-CM

## 2016-01-28 ENCOUNTER — Telehealth: Payer: Self-pay | Admitting: Cardiovascular Disease

## 2016-01-28 NOTE — Telephone Encounter (Signed)
Patient called and she has her bp listing: 01/25/16 7am 160/84 p 70, 8:30 am 132/70 p 76, 5:30pm 142/78 p 73 01/26/16 7am 166/86 p 66, 9am 136/72 p73, 5 pm 125/82 p79 01/27/16 7 am 152/82 p 67, 11:30 am 128/66 p 67 01/28/16 7 am 160/77 p 65, 10 am 138/78 p80

## 2016-01-28 NOTE — Telephone Encounter (Signed)
S/w pt regarding BP readings. She had called April 3 concerned of elevated BP in the 180s.  She had been monitoring and called today with readings. BP elevated in the AM (150s to 160s)  before morning medications. BP improves after taking coreg, lisinopril and aldactone.  She understands to continue medications and report if BP remains elevated after taking medications.  Pt is appreciative of the call with no further questions.

## 2016-02-21 ENCOUNTER — Other Ambulatory Visit: Payer: Self-pay | Admitting: *Deleted

## 2016-02-21 ENCOUNTER — Telehealth: Payer: Self-pay | Admitting: Cardiovascular Disease

## 2016-02-21 MED ORDER — LISINOPRIL 40 MG PO TABS: 40.0000 mg | ORAL_TABLET | Freq: Every day | ORAL | Status: DC

## 2016-02-21 MED ORDER — CARVEDILOL 6.25 MG PO TABS: 6.2500 mg | ORAL_TABLET | Freq: Two times a day (BID) | ORAL | Status: DC

## 2016-02-21 NOTE — Telephone Encounter (Signed)
°*  STAT* If patient is at the pharmacy, call can be transferred to refill team.   1. Which medications need to be refilled? (please list name of each medication and dose if known) Carvedilol   2. Which pharmacy/location (including street and city if local pharmacy) is medication to be sent to[facility] [place] [facility] [address] for they are not to ship out another one until next month.   3. Do they need a 30 day or 90 day supply? 90 day

## 2016-02-21 NOTE — Telephone Encounter (Signed)
[id number]

## 2016-02-25 ENCOUNTER — Other Ambulatory Visit: Payer: Self-pay | Admitting: *Deleted

## 2016-02-25 MED ORDER — LISINOPRIL 40 MG PO TABS: 40.0000 mg | ORAL_TABLET | Freq: Every day | ORAL | Status: DC

## 2016-02-25 NOTE — Telephone Encounter (Signed)
Requested Prescriptions   Signed Prescriptions Disp Refills  . lisinopril (PRINIVIL,ZESTRIL) 40 MG tablet 90 tablet 3    Sig: Take 1 tablet (40 mg total) by mouth daily.    Authorizing Provider: Kathlyn Sacramento A    Ordering User: Britt Bottom

## 2016-02-29 ENCOUNTER — Other Ambulatory Visit: Payer: Self-pay | Admitting: *Deleted

## 2016-02-29 MED ORDER — CARVEDILOL 6.25 MG PO TABS: 6.2500 mg | ORAL_TABLET | Freq: Two times a day (BID) | ORAL | Status: DC

## 2016-02-29 NOTE — Telephone Encounter (Signed)
Requested Prescriptions   Signed Prescriptions Disp Refills  . carvedilol (COREG) 6.25 MG tablet 180 tablet 3    Sig: Take 1 tablet (6.25 mg total) by mouth 2 (two) times daily.    Authorizing Provider: ARIDA, MUHAMMAD A    Ordering User: Drevon Plog C    

## 2016-03-27 ENCOUNTER — Telehealth: Payer: Self-pay | Admitting: Internal Medicine

## 2016-03-27 NOTE — Telephone Encounter (Signed)
Pt fell last Tuesday from ladder (no more thatn 3 ft). She is sore on the left side, below her breast. It really hurts  when she lays down, no trouble breathing. Should she make a appt to be seen or can she get a x-ray?

## 2016-03-27 NOTE — Telephone Encounter (Signed)
Patient stated that she feel 1 week ago. She only has pain at night when she lays on her left side. She was scheduled with NP Arnett at 4 p.m.

## 2016-03-28 ENCOUNTER — Encounter: Payer: Self-pay | Admitting: Family

## 2016-03-28 ENCOUNTER — Ambulatory Visit
Admission: RE | Admit: 2016-03-28 | Discharge: 2016-03-28 | Disposition: A | Discharge status: Home / Self Care | Payer: Medicare Other | Source: Ambulatory Visit | Attending: Family | Admitting: Family

## 2016-03-28 ENCOUNTER — Ambulatory Visit (INDEPENDENT_AMBULATORY_CARE_PROVIDER_SITE_OTHER): Payer: Medicare Other | Admitting: Family

## 2016-03-28 VITALS — BP 104/60 | HR 70 | Temp 98.7°F | Resp 16 | Ht 62.5 in | Wt 133.0 lb

## 2016-03-28 DIAGNOSIS — R0781 Pleurodynia: Secondary | ICD-10-CM | POA: Diagnosis present

## 2016-03-28 DIAGNOSIS — J9811 Atelectasis: Secondary | ICD-10-CM | POA: Diagnosis not present

## 2016-03-28 NOTE — Patient Instructions (Signed)
Chest Xray.   If there is no improvement in your symptoms, or if there is any worsening of symptoms, or if you have any additional concerns, please return for re-evaluation; or, if we are closed, consider going to the Emergency Room for evaluation if symptoms urgent.

## 2016-03-28 NOTE — Progress Notes (Signed)
Subjective:    Patient ID: Crystal Lynch, female    DOB: October 31, 1940, 75 y.o.   MRN: [id number]   Crystal Lynch is a 75 y.o. female who presents today for an acute visit.    HPI Comments: Patient here for evaluation of left sided chest wall pain after falling from a ladder one week ago. Fell 3 feet into shrubs and tumbled into the yard. Hit left shoulder and left side. Shoulder pain resolved. Continues to have left chest wall pain and cannot sleep on that side. Relieved with Aleve.   Denies exertional chest pain or pressure, numbness or tingling radiating to left arm or jaw, palpitations, dizziness, frequent headaches, changes in vision, or shortness of breath.    Past Medical History  Diagnosis Date  . Hypertension   . Hypercholesterolemia   . Esophagitis     s/p esophageal dilatation  . Hypothyroidism   . Endometriosis   . History of ovarian cyst   . Arthritis   . GERD (gastroesophageal reflux disease)   . Chicken pox    Allergies: Meloxicam Current Outpatient Prescriptions on File Prior to Visit  Medication Sig Dispense Refill  . aspirin 81 MG tablet Take 81 mg by mouth daily.    . Calcium Carbonate-Vitamin D (CALTRATE 600+D) 600-400 MG-UNIT per tablet Take 1 tablet by mouth 2 (two) times daily.    . carvedilol (COREG) 6.25 MG tablet Take 1 tablet (6.25 mg total) by mouth 2 (two) times daily. 180 tablet 3  . levothyroxine (SYNTHROID, LEVOTHROID) 88 MCG tablet TAKE 1 TABLET DAILY 90 tablet 2  . lisinopril (PRINIVIL,ZESTRIL) 40 MG tablet Take 1 tablet (40 mg total) by mouth daily. 90 tablet 3  . Multiple Vitamin (MULTIVITAMIN) tablet Take 1 tablet by mouth daily.    Marland Kitchen omeprazole (PRILOSEC) 20 MG capsule TAKE 1 CAPSULE DAILY 90 capsule 2  . simvastatin (ZOCOR) 10 MG tablet Take 1 tablet (10 mg total) by mouth at bedtime. 90 tablet 3  . spironolactone (ALDACTONE) 25 MG tablet Take 1 tablet (25 mg total) by mouth daily. 90 tablet 3   No current facility-administered  medications on file prior to visit.    Social History  Substance Use Topics  . Smoking status: Never Smoker   . Smokeless tobacco: Never Used  . Alcohol Use: No    Review of Systems  Constitutional: Negative for fever and chills.  Respiratory: Negative for cough, shortness of breath and wheezing.   Cardiovascular: Negative for chest pain and palpitations.  Gastrointestinal: Negative for nausea and vomiting.      Objective:    BP 104/60 mmHg  Pulse 70  Temp(Src) 98.7 F (37.1 C) (Oral)  Resp 16  Ht 5' 2.5" (1.588 m)  Wt 133 lb (60.328 kg)  BMI 23.92 kg/m2  SpO2 95%  LMP 10/21/1984   Physical Exam  Constitutional: She appears well-developed and well-nourished.  Eyes: Conjunctivae are normal.  Cardiovascular: Normal rate, regular rhythm, normal heart sounds and normal pulses.   Pulmonary/Chest: Effort normal and breath sounds normal. She has no wheezes. She has no rhonchi. She has no rales.    Neurological: She is alert.  Skin: Skin is warm and dry.  Psychiatric: She has a normal mood and affect. Her speech is normal and behavior is normal. Thought content normal.  Vitals reviewed.      Assessment & Plan:   1. Rib pain on left side Pending evaluation of fracture after fall. I'm reassured that patient is in no acute  distress, no shortness of breath, cough, or cardiac symptoms.   - DG Chest 2 View    I have discontinued Crystal Lynch's Glucosamine-Chondroitin. I am also having her maintain her aspirin, Calcium Carbonate-Vitamin D, multivitamin, levothyroxine, simvastatin, spironolactone, omeprazole, lisinopril, and carvedilol.   No orders of the defined types were placed in this encounter.     Start medications as prescribed and explained to patient on After Visit Summary ( AVS). Risks, benefits, and alternatives of the medications and treatment plan prescribed today were discussed, and patient expressed understanding.   Education regarding symptom management and  diagnosis given to patient.   Follow-up:Plan follow-up and return precautions given if any worsening symptoms or change in condition.   Continue to follow with Crystal Pheasant, MD for routine health maintenance.   Crystal Lynch and I agreed with plan.   Crystal Paris, FNP

## 2016-03-29 ENCOUNTER — Telehealth: Payer: Self-pay | Admitting: Internal Medicine

## 2016-03-29 NOTE — Telephone Encounter (Signed)
Pt had a x-ray done on her ribs yesterday at [facility]. She would like to know the results. Please call her at home.

## 2016-03-29 NOTE — Telephone Encounter (Signed)
Patient is aware of x-ray result 

## 2016-06-19 ENCOUNTER — Ambulatory Visit (INDEPENDENT_AMBULATORY_CARE_PROVIDER_SITE_OTHER): Payer: Medicare Other | Admitting: Cardiovascular Disease

## 2016-06-19 ENCOUNTER — Encounter: Payer: Self-pay | Admitting: Cardiovascular Disease

## 2016-06-19 VITALS — BP 110/60 | HR 67 | Ht 62.5 in | Wt 136.4 lb

## 2016-06-19 DIAGNOSIS — I10 Essential (primary) hypertension: Secondary | ICD-10-CM | POA: Diagnosis not present

## 2016-06-19 DIAGNOSIS — E785 Hyperlipidemia, unspecified: Secondary | ICD-10-CM | POA: Diagnosis not present

## 2016-06-19 NOTE — Patient Instructions (Signed)
Medication Instructions: Continue same medications.   Labwork: None.   Procedures/Testing: None.   Follow-Up: 1 year with Dr. Arida.   Any Additional Special Instructions Will Be Listed Below (If Applicable).     If you need a refill on your cardiac medications before your next appointment, please call your pharmacy.   

## 2016-06-19 NOTE — Progress Notes (Signed)
Cardiology Office Note   Date:  06/19/2016   ID:  Crystal Lynch, Crystal Lynch 05-25-1941, MRN [id number]  PCP:  Einar Pheasant, MD  Cardiologist:   Kathlyn Sacramento, MD   No chief complaint on file.     History of Present Illness: Crystal Lynch is a 75 y.o. female who presents for  a follow-up visit regarding refractory  hypertension . She has no previous cardiac history. Other medical problems include hyperlipidemia and GERD.   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 However, renal artery angiography  showed no evidence of significant obstructive disease. The left renal artery was found to be very tortuous in the mid and distal segment which was likely the cause of false-positive duplex.  Blood pressure improved significantly with spironolactone. Blood pressure was actually running high during last visit and thus hydrochlorothiazide was decreased to 12.5 mg once daily. Hydrochlorothiazide was subsequently discontinued due to hyponatremia.  She has been doing extremely well and denies any chest pain, shortness of breath or palpitations. Blood pressure has been well-controlled. Sometimes it's elevated in the morning but it goes down to normal range after she takes her medications.  Past Medical History:  Diagnosis Date  . Arthritis   . Chicken pox   . Endometriosis   . Esophagitis    s/p esophageal dilatation  . GERD (gastroesophageal reflux disease)   . History of ovarian cyst   . Hypercholesterolemia   . Hypertension   . Hypothyroidism     Past Surgical History:  Procedure Laterality Date  . ABDOMINAL SURGERY     found to have an ovarian cyst and endometriosis  . APPENDECTOMY       Current Outpatient Prescriptions  Medication Sig Dispense Refill  . aspirin 81 MG tablet Take 81 mg by mouth daily.    . Calcium Carbonate-Vitamin D (CALTRATE 600+D) 600-400 MG-UNIT per tablet Take  1 tablet by mouth 2 (two) times daily.    . carvedilol (COREG) 6.25 MG tablet Take 1 tablet (6.25 mg total) by mouth 2 (two) times daily. 180 tablet 3  . levothyroxine (SYNTHROID, LEVOTHROID) 88 MCG tablet TAKE 1 TABLET DAILY 90 tablet 2  . lisinopril (PRINIVIL,ZESTRIL) 40 MG tablet Take 1 tablet (40 mg total) by mouth daily. 90 tablet 3  . Multiple Vitamin (MULTIVITAMIN) tablet Take 1 tablet by mouth daily.    Marland Kitchen omeprazole (PRILOSEC) 20 MG capsule TAKE 1 CAPSULE DAILY 90 capsule 2  . simvastatin (ZOCOR) 10 MG tablet Take 1 tablet (10 mg total) by mouth at bedtime. 90 tablet 3  . spironolactone (ALDACTONE) 25 MG tablet Take 1 tablet (25 mg total) by mouth daily. 90 tablet 3   No current facility-administered medications for this visit.     Allergies:   Meloxicam    Social History:  The patient  reports that she has never smoked. She has never used smokeless tobacco. She reports that she does not drink alcohol or use drugs.   Family History:  The patient's family history includes Breast cancer in her maternal aunt, mother, and sister; Heart attack in her father and mother; Heart disease in her mother; Lung disease in her father.    ROS:  Please see the history of present illness.   Otherwise, review of systems are positive for none.   All other systems are reviewed and negative.    PHYSICAL EXAM: VS:  BP 110/60   Pulse 67  Ht 5' 2.5" (1.588 m)   Wt 136 lb 6.4 oz (61.9 kg)   LMP 10/21/1984   BMI 24.55 kg/m  , BMI Body mass index is 24.55 kg/m. GEN: Well nourished, well developed, in no acute distress  HEENT: normal  Neck: no JVD, carotid bruits, or masses Cardiac: RRR; no murmurs, rubs, or gallops,no edema  Respiratory:  clear to auscultation bilaterally, normal work of breathing GI: soft, nontender, nondistended, + BS MS: no deformity or atrophy  Skin: warm and dry, no rash Neuro:  Strength and sensation are intact Psych: euthymic mood, full affect   EKG:  EKG is ordered  today. The ekg ordered today demonstrates  normal sinus rhythm with no significant ST or T wave changes.   Recent Labs: 08/12/2015: Platelets 267 01/05/2016: ALT 16; BUN 15; Creatinine, Ser 0.80; Potassium 4.0; Sodium 138    Lipid Panel    Component Value Date/Time   CHOL 153 01/05/2016 0840   TRIG 161.0 (H) 01/05/2016 0840   HDL 47.50 01/05/2016 0840   CHOLHDL 3 01/05/2016 0840   VLDL 32.2 01/05/2016 0840   LDLCALC 73 01/05/2016 0840      Wt Readings from Last 3 Encounters:  06/19/16 136 lb 6.4 oz (61.9 kg)  03/28/16 133 lb (60.3 kg)  01/10/16 134 lb 4 oz (60.9 kg)       No flowsheet data found.    ASSESSMENT AND PLAN:  1.  Essential hypertension: Blood pressure is well controlled on current medications including carvedilol, lisinopril and spironolactone. Hydrochlorothiazide was discontinued due to hyponatremia. If blood pressure goes up in the future, we do have the option of increasing the dose of carvedilol.  2. Hyperlipidemia: Currently well controlled on current dose of simvastatin. Most recent LDL was 73.    Disposition:   FU with me in 1 year  Signed,  Kathlyn Sacramento, MD  06/19/2016 2:51 PM    [facility]

## 2016-06-30 ENCOUNTER — Other Ambulatory Visit: Payer: Self-pay | Admitting: Internal Medicine

## 2016-07-07 ENCOUNTER — Other Ambulatory Visit (INDEPENDENT_AMBULATORY_CARE_PROVIDER_SITE_OTHER): Payer: Medicare Other

## 2016-07-07 DIAGNOSIS — M8580 Other specified disorders of bone density and structure, unspecified site: Secondary | ICD-10-CM

## 2016-07-07 DIAGNOSIS — E7800 Pure hypercholesterolemia, unspecified: Secondary | ICD-10-CM | POA: Diagnosis not present

## 2016-07-07 DIAGNOSIS — E039 Hypothyroidism, unspecified: Secondary | ICD-10-CM

## 2016-07-07 DIAGNOSIS — I10 Essential (primary) hypertension: Secondary | ICD-10-CM | POA: Diagnosis not present

## 2016-07-07 LAB — LIPID PANEL
CHOL/HDL RATIO: 4
CHOLESTEROL: 160 mg/dL (ref 0–200)
HDL: 44.3 mg/dL (ref >39.00)
LDL CALC: 87 mg/dL (ref 0–99)
NonHDL: 115.34
TRIGLYCERIDES: 141 mg/dL (ref 0.0–149.0)
VLDL: 28.2 mg/dL (ref 0.0–40.0)

## 2016-07-07 LAB — CBC WITH DIFFERENTIAL/PLATELET
BASOS PCT: 0.7 % (ref 0.0–3.0)
Basophils Absolute: 0 10*3/uL (ref 0.0–0.1)
EOS ABS: 0.2 10*3/uL (ref 0.0–0.7)
EOS PCT: 3.6 % (ref 0.0–5.0)
HEMATOCRIT: 42 % (ref 36.0–46.0)
HEMOGLOBIN: 14.4 g/dL (ref 12.0–15.0)
LYMPHS PCT: 35.9 % (ref 12.0–46.0)
Lymphs Abs: 1.9 10*3/uL (ref 0.7–4.0)
MCHC: 34.3 g/dL (ref 30.0–36.0)
MCV: 89.2 fl (ref 78.0–100.0)
MONO ABS: 0.5 10*3/uL (ref 0.1–1.0)
Monocytes Relative: 10.1 % (ref 3.0–12.0)
NEUTROS ABS: 2.6 10*3/uL (ref 1.4–7.7)
Neutrophils Relative %: 49.7 % (ref 43.0–77.0)
Platelets: 243 10*3/uL (ref 150.0–400.0)
RBC: 4.71 Mil/uL (ref 3.87–5.11)
RDW: 13.2 % (ref 11.5–15.5)
WBC: 5.2 10*3/uL (ref 4.0–10.5)

## 2016-07-07 LAB — HEPATIC FUNCTION PANEL
ALBUMIN: 4.2 g/dL (ref 3.5–5.2)
ALT: 15 U/L (ref 0–35)
AST: 17 U/L (ref 0–37)
Alkaline Phosphatase: 62 U/L (ref 39–117)
Bilirubin, Direct: 0.2 mg/dL (ref 0.0–0.3)
TOTAL PROTEIN: 6.7 g/dL (ref 6.0–8.3)
Total Bilirubin: 1.4 mg/dL — ABNORMAL HIGH (ref 0.2–1.2)

## 2016-07-07 LAB — BASIC METABOLIC PANEL
BUN: 20 mg/dL (ref 6–23)
CALCIUM: 9.9 mg/dL (ref 8.4–10.5)
CO2: 28 mEq/L (ref 19–32)
Chloride: 101 mEq/L (ref 96–112)
Creatinine, Ser: 0.9 mg/dL (ref 0.40–1.20)
GFR: 64.6 mL/min (ref >60.00)
GLUCOSE: 90 mg/dL (ref 70–99)
POTASSIUM: 3.9 meq/L (ref 3.5–5.1)
SODIUM: 135 meq/L (ref 135–145)

## 2016-07-07 LAB — VITAMIN D 25 HYDROXY (VIT D DEFICIENCY, FRACTURES): VITD: 53.63 ng/mL (ref 30.00–100.00)

## 2016-07-07 LAB — TSH: TSH: 3.15 u[IU]/mL (ref 0.35–4.50)

## 2016-07-08 ENCOUNTER — Encounter: Payer: Self-pay | Admitting: Internal Medicine

## 2016-07-12 ENCOUNTER — Encounter: Payer: Self-pay | Admitting: Internal Medicine

## 2016-07-12 ENCOUNTER — Ambulatory Visit (INDEPENDENT_AMBULATORY_CARE_PROVIDER_SITE_OTHER): Payer: Medicare Other | Admitting: Internal Medicine

## 2016-07-12 DIAGNOSIS — I10 Essential (primary) hypertension: Secondary | ICD-10-CM | POA: Diagnosis not present

## 2016-07-12 DIAGNOSIS — E7800 Pure hypercholesterolemia, unspecified: Secondary | ICD-10-CM | POA: Diagnosis not present

## 2016-07-12 DIAGNOSIS — E039 Hypothyroidism, unspecified: Secondary | ICD-10-CM | POA: Diagnosis not present

## 2016-07-12 NOTE — Progress Notes (Signed)
Pre visit review using our clinic review tool, if applicable. No additional management support is needed unless otherwise documented below in the visit note. 

## 2016-07-12 NOTE — Progress Notes (Signed)
Patient ID: [name], female   DOB: 30-Apr-1941, 75 y.o.   MRN: [id number]   Subjective:    Patient ID: [name], female    DOB: June 26, 1941, 75 y.o.   MRN: [id number]  HPI  Patient here for a scheduled follow up.  Has a history of hypertension.  Followed by Dr Fletcher Anon.  Just evaluated 06/19/16.  Stable.  Doing well.  Stays active.  No cardiac symptoms with increased activity or exertion.  No sob.  No acid reflux.  No abdominal pain or cramping.  Bowels stable.  States am blood pressure can be [id number] systolic.  Improves as day progresses.  Feels she is doing well.     Past Medical History:  Diagnosis Date  . Arthritis   . Chicken pox   . Endometriosis   . Esophagitis    s/p esophageal dilatation  . GERD (gastroesophageal reflux disease)   . History of ovarian cyst   . Hypercholesterolemia   . Hypertension   . Hypothyroidism    Past Surgical History:  Procedure Laterality Date  . ABDOMINAL SURGERY     found to have an ovarian cyst and endometriosis  . APPENDECTOMY     Family History  Problem Relation Age of Onset  . Lung disease Father   . Heart attack Father   . Heart disease Mother     myocardial infarction  . Breast cancer Mother   . Heart attack Mother   . Breast cancer Sister   . Breast cancer Maternal Aunt   . Colon cancer Neg Hx    Social History   Social History  . Marital status: Married    Spouse name: N/A  . Number of children: N/A  . Years of education: N/A   Social History Main Topics  . Smoking status: Never Smoker  . Smokeless tobacco: Never Used  . Alcohol use No  . Drug use: No  . Sexual activity: Not Asked   Other Topics Concern  . None   Social History Narrative  . None    Outpatient Encounter Prescriptions as of 07/12/2016  Medication Sig  . aspirin 81 MG tablet Take 81 mg by mouth daily.  . Calcium Carbonate-Vitamin D (CALTRATE 600+D) 600-400 MG-UNIT per tablet Take 1 tablet by mouth 2 (two) times daily.  . carvedilol (COREG)  6.25 MG tablet Take 1 tablet (6.25 mg total) by mouth 2 (two) times daily.  Marland Kitchen levothyroxine (SYNTHROID, LEVOTHROID) 88 MCG tablet TAKE 1 TABLET DAILY  . lisinopril (PRINIVIL,ZESTRIL) 40 MG tablet Take 1 tablet (40 mg total) by mouth daily.  . Multiple Vitamin (MULTIVITAMIN) tablet Take 1 tablet by mouth daily.  Marland Kitchen omeprazole (PRILOSEC) 20 MG capsule TAKE 1 CAPSULE DAILY  . simvastatin (ZOCOR) 10 MG tablet Take 1 tablet (10 mg total) by mouth at bedtime.  Marland Kitchen spironolactone (ALDACTONE) 25 MG tablet Take 1 tablet (25 mg total) by mouth daily.   No facility-administered encounter medications on file as of 07/12/2016.     Review of Systems  Constitutional: Negative for appetite change and unexpected weight change.  HENT: Negative for congestion and sinus pressure.   Respiratory: Negative for cough, chest tightness and shortness of breath.   Cardiovascular: Negative for chest pain, palpitations and leg swelling.  Gastrointestinal: Negative for abdominal pain, diarrhea, nausea and vomiting.  Genitourinary: Negative for difficulty urinating and dysuria.  Musculoskeletal: Negative for back pain and joint swelling.  Skin: Negative for color change and rash.  Neurological: Negative for dizziness, light-headedness and  headaches.  Psychiatric/Behavioral: Negative for agitation and dysphoric mood.       Objective:    Physical Exam  Constitutional: She appears well-developed and well-nourished. No distress.  HENT:  Nose: Nose normal.  Mouth/Throat: Oropharynx is clear and moist.  Neck: Neck supple. No thyromegaly present.  Cardiovascular: Normal rate and regular rhythm.   Pulmonary/Chest: Breath sounds normal. No respiratory distress. She has no wheezes.  Abdominal: Soft. Bowel sounds are normal. There is no tenderness.  Musculoskeletal: She exhibits no edema or tenderness.  Lymphadenopathy:    She has no cervical adenopathy.  Skin: No rash noted. No erythema.  Psychiatric: She has a normal  mood and affect. Her behavior is normal.    BP 120/62   Pulse 62   Temp 97.9 F (36.6 C) (Oral)   Ht 5\' 3"  (1.6 m)   Wt 134 lb 9.6 oz (61.1 kg)   LMP 10/21/1984   SpO2 97%   BMI 23.84 kg/m  Wt Readings from Last 3 Encounters:  07/12/16 134 lb 9.6 oz (61.1 kg)  06/19/16 136 lb 6.4 oz (61.9 kg)  03/28/16 133 lb (60.3 kg)     Lab Results  Component Value Date   WBC 5.2 07/07/2016   HGB 14.4 07/07/2016   HCT 42.0 07/07/2016   PLT 243.0 07/07/2016   GLUCOSE 90 07/07/2016   CHOL 160 07/07/2016   TRIG 141.0 07/07/2016   HDL 44.30 07/07/2016   LDLCALC 87 07/07/2016   ALT 15 07/07/2016   AST 17 07/07/2016   NA 135 07/07/2016   K 3.9 07/07/2016   CL 101 07/07/2016   CREATININE 0.90 07/07/2016   BUN 20 07/07/2016   CO2 28 07/07/2016   TSH 3.15 07/07/2016   INR 1.0 08/12/2015    Dg Chest 2 View  Result Date: 03/28/2016 CLINICAL DATA:  Left rib pain inferior and lateral to left breast after fall 1 week ago EXAM: CHEST  2 VIEW COMPARISON:  12/07/2014 FINDINGS: The heart size and vascular pattern are normal. No consolidation effusion or pneumothorax. Minimal left lower lobe atelectasis. Bony thorax grossly intact although detailed rib views not performed. IMPRESSION: Mild left base atelectasis. Consider rib series 4 maximal sensitivity for detection of potential rib fracture if indicated. Electronically Signed   By: Skipper Cliche M.D.   On: 03/28/2016 17:39       Assessment & Plan:   Problem List Items Addressed This Visit    Hyperbilirubinemia    Has varied, but overall stable.  Follow liver panel.        Hypercholesterolemia    Low cholesterol diet and exercise.  On simvastatin.  Follow lipid panel and liver function tests.        Relevant Orders   Lipid panel   Hepatic function panel   Hypertension    Blood pressure under good control.  Continue same medication regimen.  Follow pressures.  Follow metabolic panel.        Relevant Orders   Basic metabolic panel     Hypothyroidism    On thyroid replacement.  Follow tsh.        Other Visit Diagnoses   None.      Einar Pheasant, MD

## 2016-07-16 ENCOUNTER — Encounter: Payer: Self-pay | Admitting: Internal Medicine

## 2016-07-16 NOTE — Assessment & Plan Note (Signed)
On thyroid replacement.  Follow tsh.  

## 2016-07-16 NOTE — Assessment & Plan Note (Signed)
Has varied, but overall stable.  Follow liver panel.

## 2016-07-16 NOTE — Assessment & Plan Note (Signed)
Blood pressure under good control.  Continue same medication regimen.  Follow pressures.  Follow metabolic panel.   

## 2016-07-16 NOTE — Assessment & Plan Note (Signed)
Low cholesterol diet and exercise.  On simvastatin.  Follow lipid panel and liver function tests.   

## 2016-08-29 ENCOUNTER — Telehealth: Payer: Self-pay | Admitting: *Deleted

## 2016-08-29 NOTE — Telephone Encounter (Signed)
Please advise 

## 2016-08-29 NOTE — Telephone Encounter (Signed)
Pt requested a Rx for a yeast infection, she finished a round antibiotic today. She has symptoms of a yeast infection.   Pharmacy [facility] [address]

## 2016-08-29 NOTE — Telephone Encounter (Signed)
Left detailed message for pt to use OTC remedies first. Advised pt to call back if symptoms persist.

## 2016-08-29 NOTE — Telephone Encounter (Signed)
She can try monistat - 3 - one applicator q hs x 3 nights.  If persistent symptoms, let us know.

## 2016-08-30 NOTE — Telephone Encounter (Addendum)
Attempted to call pt back; no answer. Left another VM for pt to return call.

## 2016-08-30 NOTE — Telephone Encounter (Signed)
Pt called back in regards to medication. Could you please call pt with instructions. Thank you!  Call pt @ [phone]

## 2016-08-30 NOTE — Telephone Encounter (Signed)
Patient states the pharmacist told her to have her doctor call in diflucan. Informed patient per previous message to try otc monistat

## 2016-10-10 ENCOUNTER — Other Ambulatory Visit: Payer: Self-pay | Admitting: Internal Medicine

## 2016-10-22 ENCOUNTER — Other Ambulatory Visit: Payer: Self-pay | Admitting: Cardiovascular Disease

## 2016-11-02 ENCOUNTER — Ambulatory Visit (INDEPENDENT_AMBULATORY_CARE_PROVIDER_SITE_OTHER): Payer: Medicare Other | Admitting: Internal Medicine

## 2016-11-02 ENCOUNTER — Telehealth: Payer: Self-pay | Admitting: Internal Medicine

## 2016-11-02 ENCOUNTER — Other Ambulatory Visit (HOSPITAL_COMMUNITY)
Admission: RE | Admit: 2016-11-02 | Discharge: 2016-11-02 | Disposition: A | Discharge status: Home / Self Care | Payer: Medicare Other | Source: Ambulatory Visit | Attending: Internal Medicine | Admitting: Internal Medicine

## 2016-11-02 VITALS — BP 128/84 | HR 74 | Temp 98.3°F | Resp 16 | Wt 138.2 lb

## 2016-11-02 DIAGNOSIS — N898 Other specified noninflammatory disorders of vagina: Secondary | ICD-10-CM | POA: Insufficient documentation

## 2016-11-02 DIAGNOSIS — R319 Hematuria, unspecified: Secondary | ICD-10-CM | POA: Diagnosis not present

## 2016-11-02 DIAGNOSIS — R300 Dysuria: Secondary | ICD-10-CM | POA: Diagnosis not present

## 2016-11-02 LAB — POC URINALSYSI DIPSTICK (AUTOMATED)
BILIRUBIN UA: NEGATIVE
GLUCOSE UA: NEGATIVE
KETONES UA: NEGATIVE
Nitrite, UA: NEGATIVE
PH UA: 7
Protein, UA: NEGATIVE
Spec Grav, UA: 1.01
Urobilinogen, UA: 0.2

## 2016-11-02 MED ORDER — NYSTATIN 100000 UNIT/GM EX CREA: 1.0000 "application " | TOPICAL_CREAM | Freq: Two times a day (BID) | CUTANEOUS | 0 refills | Status: DC

## 2016-11-02 MED ORDER — CIPROFLOXACIN HCL 500 MG PO TABS: 500.0000 mg | ORAL_TABLET | Freq: Two times a day (BID) | ORAL | 0 refills | Status: DC

## 2016-11-02 NOTE — Telephone Encounter (Signed)
Reason for call: blood in urine  Symptoms: blood in urine , low back pain, dysuria yesterday pm, has been on antibiotics for toe in October Duration this am, last night   Medications: none  no appointments available previous history UTI, yeast infection recently 3 weeks ago used Monistat OTC Please advise.

## 2016-11-02 NOTE — Telephone Encounter (Signed)
I can see her today at 12:15 - work in for this.

## 2016-11-02 NOTE — Progress Notes (Signed)
Pre visit review using our clinic review tool, if applicable. No additional management support is needed unless otherwise documented below in the visit note. 

## 2016-11-02 NOTE — Telephone Encounter (Signed)
Patient coming in for appointment today.

## 2016-11-02 NOTE — Progress Notes (Signed)
Patient ID: Crystal Lynch, female   DOB: 03/14/1941, 76 y.o.   MRN: [id number]   Subjective:    Patient ID: Crystal Lynch, female    DOB: 07-13-1941, 76 y.o.   MRN: [id number]  HPI  Patient here as a work in with concerns regarding possible uti.  She reports she has had a foot infection/rash since 07/2016.  Was seen at acute care.  Placed on doxycycline.  Reevaluated 08/20/16 - placed on prednisone.  Reevaluated 09/18/16 - doxycycline and bactroban.  Saw podiatry.  No changes made.  F/u in walk in clinic 10/14/16 - placed on augmentin.  Had f/u with dermatology 10/26/16 - placed on clindamycin lotion and econazole cream.  Lesions improved.  Continues f/u with dermatology.  Noticed some burning with urination.  Appears to be more peri vaginal irritation and burning when the urine touches the skin.  No vaginal discharge.  No abdominal pain.  No significant back pain.    Past Medical History:  Diagnosis Date  . Arthritis   . Chicken pox   . Endometriosis   . Esophagitis    s/p esophageal dilatation  . GERD (gastroesophageal reflux disease)   . History of ovarian cyst   . Hypercholesterolemia   . Hypertension   . Hypothyroidism    Past Surgical History:  Procedure Laterality Date  . ABDOMINAL SURGERY     found to have an ovarian cyst and endometriosis  . APPENDECTOMY     Family History  Problem Relation Age of Onset  . Lung disease Father   . Heart attack Father   . Heart disease Mother     myocardial infarction  . Breast cancer Mother   . Heart attack Mother   . Breast cancer Sister   . Breast cancer Maternal Aunt   . Colon cancer Neg Hx    Social History   Social History  . Marital status: Married    Spouse name: N/A  . Number of children: N/A  . Years of education: N/A   Social History Main Topics  . Smoking status: Never Smoker  . Smokeless tobacco: Never Used  . Alcohol use No  . Drug use: No  . Sexual activity: Not Asked   Other Topics Concern  . None    Social History Narrative  . None    Outpatient Encounter Prescriptions as of 11/02/2016  Medication Sig  . aspirin 81 MG tablet Take 81 mg by mouth daily.  . Calcium Carbonate-Vitamin D (CALTRATE 600+D) 600-400 MG-UNIT per tablet Take 1 tablet by mouth 2 (two) times daily.  . carvedilol (COREG) 6.25 MG tablet Take 1 tablet (6.25 mg total) by mouth 2 (two) times daily.  Marland Kitchen levothyroxine (SYNTHROID, LEVOTHROID) 88 MCG tablet TAKE 1 TABLET DAILY  . lisinopril (PRINIVIL,ZESTRIL) 40 MG tablet Take 1 tablet (40 mg total) by mouth daily.  . Multiple Vitamin (MULTIVITAMIN) tablet Take 1 tablet by mouth daily.  Marland Kitchen omeprazole (PRILOSEC) 20 MG capsule TAKE 1 CAPSULE DAILY  . simvastatin (ZOCOR) 10 MG tablet Take 1 tablet (10 mg total) by mouth at bedtime.  Marland Kitchen spironolactone (ALDACTONE) 25 MG tablet TAKE 1 TABLET DAILY  . ciprofloxacin (CIPRO) 500 MG tablet Take 1 tablet (500 mg total) by mouth 2 (two) times daily.  Marland Kitchen nystatin cream (MYCOSTATIN) Apply 1 application topically 2 (two) times daily.   No facility-administered encounter medications on file as of 11/02/2016.     Review of Systems  Constitutional: Negative for appetite change and fever.  Cardiovascular: Negative  for leg swelling.  Gastrointestinal: Negative for diarrhea, nausea and vomiting.  Genitourinary: Negative for hematuria and vaginal discharge.       Burning with urination as outlined.    Musculoskeletal: Negative for myalgias.       No significant back pain.   Skin: Negative for color change and rash.  Psychiatric/Behavioral: Negative for agitation and dysphoric mood.       Objective:    Physical Exam  Constitutional: She appears well-developed and well-nourished. No distress.  Cardiovascular: Normal rate and regular rhythm.   Pulmonary/Chest: Breath sounds normal. No respiratory distress. She has no wheezes.  Abdominal: Soft. Bowel sounds are normal. There is no tenderness.  Genitourinary:  Genitourinary Comments:  Normal external genitalia.  Vaginal vault without lesions.  Discharge present.  Could not appreciate any adnexal masses or tenderness.  KOH/wet prep obtained.      BP 128/84 (BP Location: Left Arm, Patient Position: Sitting, Cuff Size: Normal)   Pulse 74   Temp 98.3 F (36.8 C) (Oral)   Resp 16   Wt 138 lb 4 oz (62.7 kg)   LMP 10/21/1984   SpO2 97%   BMI 24.49 kg/m  Wt Readings from Last 3 Encounters:  11/02/16 138 lb 4 oz (62.7 kg)  07/12/16 134 lb 9.6 oz (61.1 kg)  06/19/16 136 lb 6.4 oz (61.9 kg)     Lab Results  Component Value Date   WBC 5.2 07/07/2016   HGB 14.4 07/07/2016   HCT 42.0 07/07/2016   PLT 243.0 07/07/2016   GLUCOSE 90 07/07/2016   CHOL 160 07/07/2016   TRIG 141.0 07/07/2016   HDL 44.30 07/07/2016   LDLCALC 87 07/07/2016   ALT 15 07/07/2016   AST 17 07/07/2016   NA 135 07/07/2016   K 3.9 07/07/2016   CL 101 07/07/2016   CREATININE 0.90 07/07/2016   BUN 20 07/07/2016   CO2 28 07/07/2016   TSH 3.15 07/07/2016   INR 1.0 08/12/2015       Assessment & Plan:   Problem List Items Addressed This Visit    Hematuria    Symptoms as outlined.  Urine dip with blood present.  Vaginal exam as outlined.  Nystatin cream to apply topically.  Send urine for culture.  rx given for cipro if symptoms worsen while awaiting culture results.        Relevant Orders   CULTURE, URINE COMPREHENSIVE (Completed)    Other Visit Diagnoses    Dysuria    -  Primary   Relevant Orders   POCT Urinalysis Dipstick (Automated) (Completed)   Vaginal discharge       Relevant Orders   Cervicovaginal ancillary only (Completed)       Einar Pheasant, MD

## 2016-11-02 NOTE — Telephone Encounter (Signed)
Pt called and stated that she feels that she has a uti. She is c/o of blood in her urine along with discomfort when urinating. Please advise, thank you!  Call pt @ [phone]

## 2016-11-03 LAB — CERVICOVAGINAL ANCILLARY ONLY: WET PREP (BD AFFIRM): NEGATIVE

## 2016-11-04 LAB — CULTURE, URINE COMPREHENSIVE

## 2016-11-05 ENCOUNTER — Encounter: Payer: Self-pay | Admitting: Internal Medicine

## 2016-11-05 ENCOUNTER — Other Ambulatory Visit: Payer: Self-pay | Admitting: Internal Medicine

## 2016-11-05 DIAGNOSIS — R319 Hematuria, unspecified: Secondary | ICD-10-CM

## 2016-11-05 NOTE — Progress Notes (Signed)
Order placed for f/u urinalysis 

## 2016-11-05 NOTE — Assessment & Plan Note (Signed)
Symptoms as outlined.  Urine dip with blood present.  Vaginal exam as outlined.  Nystatin cream to apply topically.  Send urine for culture.  rx given for cipro if symptoms worsen while awaiting culture results.

## 2016-11-24 ENCOUNTER — Telehealth: Payer: Self-pay | Admitting: Internal Medicine

## 2016-11-24 NOTE — Telephone Encounter (Signed)
Patient coming in for repeat urine , appointment scheduled.

## 2016-11-24 NOTE — Telephone Encounter (Signed)
Pt called and stated that she had a Uti about 3 weeks ago and Dr. Nicki Reaper suggested that she would need to come back in 2 weeks. Pt states that she is not having symptoms or anything. Does she need to come back in for a Ua.? Please advise, thank you!  Call pt @ [phone]

## 2016-11-29 ENCOUNTER — Other Ambulatory Visit (INDEPENDENT_AMBULATORY_CARE_PROVIDER_SITE_OTHER): Payer: Medicare Other

## 2016-11-29 DIAGNOSIS — R319 Hematuria, unspecified: Secondary | ICD-10-CM | POA: Diagnosis not present

## 2016-11-29 LAB — URINALYSIS, ROUTINE W REFLEX MICROSCOPIC
Bilirubin Urine: NEGATIVE
Ketones, ur: NEGATIVE
Leukocytes, UA: NEGATIVE
NITRITE: NEGATIVE
PH: 5.5 (ref 5.0–8.0)
Specific Gravity, Urine: 1.01 (ref 1.000–1.030)
TOTAL PROTEIN, URINE-UPE24: NEGATIVE
URINE GLUCOSE: NEGATIVE
UROBILINOGEN UA: 0.2 (ref 0.0–1.0)

## 2016-11-30 ENCOUNTER — Encounter: Payer: Self-pay | Admitting: Internal Medicine

## 2016-12-08 ENCOUNTER — Other Ambulatory Visit: Payer: Self-pay | Admitting: Internal Medicine

## 2016-12-12 LAB — HM MAMMOGRAPHY

## 2016-12-27 ENCOUNTER — Other Ambulatory Visit: Payer: Self-pay | Admitting: Internal Medicine

## 2017-01-04 ENCOUNTER — Other Ambulatory Visit (INDEPENDENT_AMBULATORY_CARE_PROVIDER_SITE_OTHER): Payer: Medicare Other

## 2017-01-04 DIAGNOSIS — E7800 Pure hypercholesterolemia, unspecified: Secondary | ICD-10-CM

## 2017-01-04 DIAGNOSIS — I10 Essential (primary) hypertension: Secondary | ICD-10-CM | POA: Diagnosis not present

## 2017-01-04 LAB — LIPID PANEL
CHOL/HDL RATIO: 4
Cholesterol: 175 mg/dL (ref 0–200)
HDL: 45.6 mg/dL (ref >39.00)
NonHDL: 129.45
Triglycerides: 255 mg/dL — ABNORMAL HIGH (ref 0.0–149.0)
VLDL: 51 mg/dL — AB (ref 0.0–40.0)

## 2017-01-04 LAB — BASIC METABOLIC PANEL
BUN: 17 mg/dL (ref 6–23)
CO2: 28 mEq/L (ref 19–32)
Calcium: 10.8 mg/dL — ABNORMAL HIGH (ref 8.4–10.5)
Chloride: 101 mEq/L (ref 96–112)
Creatinine, Ser: 0.87 mg/dL (ref 0.40–1.20)
GFR: 67.09 mL/min (ref >60.00)
Glucose, Bld: 95 mg/dL (ref 70–99)
POTASSIUM: 4.2 meq/L (ref 3.5–5.1)
SODIUM: 138 meq/L (ref 135–145)

## 2017-01-04 LAB — HEPATIC FUNCTION PANEL
ALT: 15 U/L (ref 0–35)
AST: 17 U/L (ref 0–37)
Albumin: 4.6 g/dL (ref 3.5–5.2)
Alkaline Phosphatase: 58 U/L (ref 39–117)
BILIRUBIN TOTAL: 1.3 mg/dL — AB (ref 0.2–1.2)
Bilirubin, Direct: 0.2 mg/dL (ref 0.0–0.3)
TOTAL PROTEIN: 6.9 g/dL (ref 6.0–8.3)

## 2017-01-04 LAB — LDL CHOLESTEROL, DIRECT: LDL DIRECT: 94 mg/dL

## 2017-01-05 ENCOUNTER — Encounter: Payer: Self-pay | Admitting: Internal Medicine

## 2017-01-09 ENCOUNTER — Encounter: Payer: Self-pay | Admitting: Internal Medicine

## 2017-01-09 ENCOUNTER — Other Ambulatory Visit (HOSPITAL_COMMUNITY)
Admission: RE | Admit: 2017-01-09 | Discharge: 2017-01-09 | Disposition: A | Discharge status: Home / Self Care | Payer: Medicare Other | Source: Ambulatory Visit | Attending: Internal Medicine | Admitting: Internal Medicine

## 2017-01-09 ENCOUNTER — Ambulatory Visit (INDEPENDENT_AMBULATORY_CARE_PROVIDER_SITE_OTHER): Payer: Medicare Other | Admitting: Internal Medicine

## 2017-01-09 VITALS — BP 122/74 | HR 74 | Temp 98.7°F | Resp 16 | Ht 62.0 in | Wt 141.2 lb

## 2017-01-09 DIAGNOSIS — Z1231 Encounter for screening mammogram for malignant neoplasm of breast: Secondary | ICD-10-CM

## 2017-01-09 DIAGNOSIS — Z0000 Encounter for general adult medical examination without abnormal findings: Secondary | ICD-10-CM | POA: Diagnosis not present

## 2017-01-09 DIAGNOSIS — E7800 Pure hypercholesterolemia, unspecified: Secondary | ICD-10-CM

## 2017-01-09 DIAGNOSIS — I10 Essential (primary) hypertension: Secondary | ICD-10-CM

## 2017-01-09 DIAGNOSIS — Z124 Encounter for screening for malignant neoplasm of cervix: Secondary | ICD-10-CM | POA: Diagnosis present

## 2017-01-09 DIAGNOSIS — R0981 Nasal congestion: Secondary | ICD-10-CM | POA: Diagnosis not present

## 2017-01-09 DIAGNOSIS — E039 Hypothyroidism, unspecified: Secondary | ICD-10-CM | POA: Diagnosis not present

## 2017-01-09 DIAGNOSIS — R319 Hematuria, unspecified: Secondary | ICD-10-CM

## 2017-01-09 DIAGNOSIS — Z1239 Encounter for other screening for malignant neoplasm of breast: Secondary | ICD-10-CM

## 2017-01-09 LAB — TSH: TSH: 1.8 u[IU]/mL (ref 0.35–4.50)

## 2017-01-09 LAB — CALCIUM: CALCIUM: 10.5 mg/dL (ref 8.4–10.5)

## 2017-01-09 MED ORDER — LISINOPRIL 40 MG PO TABS: 40.0000 mg | ORAL_TABLET | Freq: Every day | ORAL | 3 refills | Status: DC

## 2017-01-09 MED ORDER — OMEPRAZOLE 20 MG PO CPDR: 20.0000 mg | DELAYED_RELEASE_CAPSULE | Freq: Every day | ORAL | 1 refills | Status: DC

## 2017-01-09 NOTE — Assessment & Plan Note (Addendum)
Physical today 01/09/17.  Colonoscopy 09/05/12.  mmmogram 01/25/16 - Birads I.  Schedule f/u mammogram.  PAP 03/22/16 - negative with negative HPV.  Repeat pap today.

## 2017-01-09 NOTE — Progress Notes (Signed)
Patient ID: Crystal Lynch, female   DOB: [birth year], 76 y.o.   MRN: [id number]   Subjective:    Patient ID: Crystal Lynch, female    DOB: 22-Jan-1941, 76 y.o.   MRN: [id number]  HPI  Patient here for her physical exam.  Has a history of hypertension.  Followed by Dr Fletcher Anon.  Stable.  No chest pain.  No sob.  No acid reflux.  No abdominal pain.  Bowels moving.  Takes probiotics.  Discussed lab results.  Increased triglycerides.  Discussed diet and exercise.  Also discussed increased calcium.  Reports previous problems with head congestion.  Right ear fullness.  ENT - [facility].     Past Medical History:  Diagnosis Date  . Arthritis   . Chicken pox   . Endometriosis   . Esophagitis    s/p esophageal dilatation  . GERD (gastroesophageal reflux disease)   . History of ovarian cyst   . Hypercholesterolemia   . Hypertension   . Hypothyroidism    Past Surgical History:  Procedure Laterality Date  . ABDOMINAL SURGERY     found to have an ovarian cyst and endometriosis  . APPENDECTOMY     Family History  Problem Relation Age of Onset  . Lung disease Father   . Heart attack Father   . Heart disease Mother     myocardial infarction  . Breast cancer Mother   . Heart attack Mother   . Breast cancer Sister   . Breast cancer Maternal Aunt   . Colon cancer Neg Hx    Social History   Social History  . Marital status: Married    Spouse name: N/A  . Number of children: N/A  . Years of education: N/A   Social History Main Topics  . Smoking status: Never Smoker  . Smokeless tobacco: Never Used  . Alcohol use No  . Drug use: No  . Sexual activity: Not Asked   Other Topics Concern  . None   Social History Narrative  . None    Outpatient Encounter Prescriptions as of 01/09/2017  Medication Sig  . aspirin 81 MG tablet Take 81 mg by mouth daily.  . Calcium Carbonate-Vitamin D (CALTRATE 600+D) 600-400 MG-UNIT per tablet Take 1 tablet by mouth 2 (two) times daily.  . carvedilol  (COREG) 6.25 MG tablet Take 1 tablet (6.25 mg total) by mouth 2 (two) times daily.  Marland Kitchen levothyroxine (SYNTHROID, LEVOTHROID) 88 MCG tablet TAKE 1 TABLET DAILY  . lisinopril (PRINIVIL,ZESTRIL) 40 MG tablet Take 1 tablet (40 mg total) by mouth daily.  . Multiple Vitamin (MULTIVITAMIN) tablet Take 1 tablet by mouth daily.  Marland Kitchen omeprazole (PRILOSEC) 20 MG capsule Take 1 capsule (20 mg total) by mouth daily.  . simvastatin (ZOCOR) 10 MG tablet TAKE 1 TABLET AT BEDTIME  . spironolactone (ALDACTONE) 25 MG tablet TAKE 1 TABLET DAILY  . [DISCONTINUED] lisinopril (PRINIVIL,ZESTRIL) 40 MG tablet Take 1 tablet (40 mg total) by mouth daily.  . [DISCONTINUED] omeprazole (PRILOSEC) 20 MG capsule TAKE 1 CAPSULE DAILY  . [DISCONTINUED] ciprofloxacin (CIPRO) 500 MG tablet Take 1 tablet (500 mg total) by mouth 2 (two) times daily. (Patient not taking: Reported on 01/09/2017)  . [DISCONTINUED] nystatin cream (MYCOSTATIN) Apply 1 application topically 2 (two) times daily. (Patient not taking: Reported on 01/09/2017)   No facility-administered encounter medications on file as of 01/09/2017.     Review of Systems  Constitutional: Negative for appetite change and unexpected weight change.  HENT:  Previous head congestion and right ear fullness.    Eyes: Negative for pain and visual disturbance.  Respiratory: Negative for cough, chest tightness and shortness of breath.   Cardiovascular: Negative for chest pain, palpitations and leg swelling.  Gastrointestinal: Negative for abdominal pain, diarrhea, nausea and vomiting.  Genitourinary: Negative for difficulty urinating and dysuria.  Musculoskeletal: Negative for back pain and joint swelling.  Skin: Negative for color change and rash.  Neurological: Negative for dizziness, light-headedness and headaches.  Hematological: Negative for adenopathy. Does not bruise/bleed easily.  Psychiatric/Behavioral: Negative for agitation and dysphoric mood.       Objective:      Physical Exam  Constitutional: She is oriented to person, place, and time. She appears well-developed and well-nourished. No distress.  HENT:  Nose: Nose normal.  Mouth/Throat: Oropharynx is clear and moist.  Eyes: Right eye exhibits no discharge. Left eye exhibits no discharge. No scleral icterus.  Neck: Neck supple. No thyromegaly present.  Cardiovascular: Normal rate and regular rhythm.   Pulmonary/Chest: Breath sounds normal. No accessory muscle usage. No tachypnea. No respiratory distress. She has no decreased breath sounds. She has no wheezes. She has no rhonchi. Right breast exhibits no inverted nipple, no mass, no nipple discharge and no tenderness (no axillary adenopathy). Left breast exhibits no inverted nipple, no mass, no nipple discharge and no tenderness (no axilarry adenopathy).  Abdominal: Soft. Bowel sounds are normal. There is no tenderness.  Genitourinary:  Genitourinary Comments: Normal external genitalia.  Vaginal vault without lesions.  Cervix identified.  Pap smear performed.  Could not appreciate any adnexal masses or tenderness.    Musculoskeletal: She exhibits no edema or tenderness.  Lymphadenopathy:    She has no cervical adenopathy.  Neurological: She is alert and oriented to person, place, and time.  Skin: Skin is warm. No rash noted. No erythema.  Psychiatric: She has a normal mood and affect. Her behavior is normal.    BP 122/74 (BP Location: Left Arm, Patient Position: Sitting, Cuff Size: Normal)   Pulse 74   Temp 98.7 F (37.1 C) (Oral)   Resp 16   Ht 5\' 2"  (1.575 m)   Wt 141 lb 3.2 oz (64 kg)   LMP 10/21/1984   SpO2 98%   BMI 25.83 kg/m  Wt Readings from Last 3 Encounters:  01/09/17 141 lb 3.2 oz (64 kg)  11/02/16 138 lb 4 oz (62.7 kg)  07/12/16 134 lb 9.6 oz (61.1 kg)     Lab Results  Component Value Date   WBC 5.2 07/07/2016   HGB 14.4 07/07/2016   HCT 42.0 07/07/2016   PLT 243.0 07/07/2016   GLUCOSE 95 01/04/2017   CHOL 175  01/04/2017   TRIG 255.0 (H) 01/04/2017   HDL 45.60 01/04/2017   LDLDIRECT 94.0 01/04/2017   LDLCALC 87 07/07/2016   ALT 15 01/04/2017   AST 17 01/04/2017   NA 138 01/04/2017   K 4.2 01/04/2017   CL 101 01/04/2017   CREATININE 0.87 01/04/2017   BUN 17 01/04/2017   CO2 28 01/04/2017   TSH 1.80 01/09/2017   INR 1.0 08/12/2015       Assessment & Plan:   Problem List Items Addressed This Visit    Health care maintenance    Physical today 01/09/17.  Colonoscopy 09/05/12.  mmmogram 01/25/16 - Birads I.  Schedule f/u mammogram.  PAP 03/22/16 - negative with negative HPV.  Repeat pap today.        Hematuria    Recheck urinalysis  to confirm no blood.        Relevant Orders   Urinalysis, Routine w reflex microscopic   Hyperbilirubinemia    Overall stable.  Follow.       Hypercalcemia    Recheck calcium.        Relevant Orders   Calcium (Completed)   Hypercholesterolemia    Low cholesterol diet and exercise.  Discussed elevated triglycerides.  Follow lipid panel.  On simvastatin.        Relevant Medications   lisinopril (PRINIVIL,ZESTRIL) 40 MG tablet   Hypertension    Blood pressure under good control.  Continue same medication regimen.  Follow pressures.  Follow metabolic panel.        Relevant Medications   lisinopril (PRINIVIL,ZESTRIL) 40 MG tablet   Hypothyroidism    On thyroid replacement.  Follow tsh.        Relevant Orders   TSH (Completed)    Other Visit Diagnoses    Routine general medical examination at a health care facility    -  Primary   Screening for breast cancer       Relevant Orders   MM DIGITAL SCREENING BILATERAL   Screening for cervical cancer       Relevant Orders   Cytology - PAP (Completed)   Nasal congestion       flonase nasal spray as directed.  saline.  follow.  if persistent problems, ENT.        Einar Pheasant, MD

## 2017-01-09 NOTE — Patient Instructions (Signed)
flonase or nasacort nasal spray - 2 sprays each nostril one time per day.  Do this in the evening.

## 2017-01-09 NOTE — Progress Notes (Signed)
Pre-visit discussion using our clinic review tool. No additional management support is needed unless otherwise documented below in the visit note.  

## 2017-01-10 ENCOUNTER — Encounter: Payer: Self-pay | Admitting: Internal Medicine

## 2017-01-11 LAB — CYTOLOGY - PAP: Diagnosis: NEGATIVE

## 2017-01-12 ENCOUNTER — Encounter: Payer: Self-pay | Admitting: Internal Medicine

## 2017-01-14 ENCOUNTER — Encounter: Payer: Self-pay | Admitting: Internal Medicine

## 2017-01-14 NOTE — Assessment & Plan Note (Signed)
Overall stable.  Follow.

## 2017-01-14 NOTE — Assessment & Plan Note (Signed)
Recheck calcium

## 2017-01-14 NOTE — Assessment & Plan Note (Signed)
Blood pressure under good control.  Continue same medication regimen.  Follow pressures.  Follow metabolic panel.   

## 2017-01-14 NOTE — Assessment & Plan Note (Signed)
On thyroid replacement.  Follow tsh.  

## 2017-01-14 NOTE — Assessment & Plan Note (Addendum)
Low cholesterol diet and exercise.  Discussed elevated triglycerides.  Follow lipid panel.  On simvastatin.

## 2017-01-14 NOTE — Assessment & Plan Note (Signed)
Recheck urinalysis to confirm no blood.

## 2017-02-09 ENCOUNTER — Ambulatory Visit
Admission: RE | Admit: 2017-02-09 | Discharge: 2017-02-09 | Disposition: A | Discharge status: Home / Self Care | Payer: Medicare Other | Source: Ambulatory Visit | Attending: Internal Medicine | Admitting: Internal Medicine

## 2017-02-09 DIAGNOSIS — Z1231 Encounter for screening mammogram for malignant neoplasm of breast: Secondary | ICD-10-CM | POA: Diagnosis present

## 2017-02-09 DIAGNOSIS — Z1239 Encounter for other screening for malignant neoplasm of breast: Secondary | ICD-10-CM

## 2017-02-10 ENCOUNTER — Encounter: Payer: Self-pay | Admitting: Internal Medicine

## 2017-02-13 ENCOUNTER — Encounter: Payer: Self-pay | Admitting: Internal Medicine

## 2017-02-13 DIAGNOSIS — M79645 Pain in left finger(s): Principal | ICD-10-CM

## 2017-02-13 DIAGNOSIS — M79644 Pain in right finger(s): Secondary | ICD-10-CM

## 2017-02-14 NOTE — Telephone Encounter (Signed)
Order placed for rheumatology referral.  

## 2017-02-20 ENCOUNTER — Telehealth: Payer: Self-pay | Admitting: Internal Medicine

## 2017-02-20 NOTE — Telephone Encounter (Signed)
Left pt message asking to call Allison back directly at [phone] to schedule AWV. Thanks! °

## 2017-02-21 ENCOUNTER — Other Ambulatory Visit: Payer: Self-pay | Admitting: Cardiovascular Disease

## 2017-02-28 ENCOUNTER — Ambulatory Visit (INDEPENDENT_AMBULATORY_CARE_PROVIDER_SITE_OTHER): Payer: Medicare Other

## 2017-02-28 VITALS — BP 120/72 | HR 83 | Temp 98.0°F | Resp 12 | Ht 62.0 in | Wt 134.8 lb

## 2017-02-28 DIAGNOSIS — Z0000 Encounter for general adult medical examination without abnormal findings: Secondary | ICD-10-CM | POA: Diagnosis not present

## 2017-02-28 DIAGNOSIS — Z23 Encounter for immunization: Secondary | ICD-10-CM | POA: Diagnosis not present

## 2017-02-28 NOTE — Patient Instructions (Addendum)
  Crystal Lynch , Thank you for taking time to come for your Medicare Wellness Visit. I appreciate your ongoing commitment to your health goals. Please review the following plan we discussed and let me know if I can assist you in the future.   Follow up with Dr. Nicki Reaper as needed.    Bring a copy of your [place] and/or Living Will to be scanned into chart.  Have a great day!  These are the goals we discussed: Goals    . Increase physical activity          Stay active and walk for exercise        This is a list of the screening recommended for you and due dates:  Health Maintenance  Topic Date Due  . Tetanus Vaccine  12/10/1958  . Flu Shot  05/23/2017  . Mammogram  02/09/2018  . DEXA scan (bone density measurement)  Completed  . Pneumonia vaccines  Completed

## 2017-02-28 NOTE — Progress Notes (Signed)
Subjective:   Crystal Lynch is a 76 y.o. female who presents for an Initial Medicare Annual Wellness Visit.  Review of Systems    No ROS.  Medicare Wellness Visit.  Cardiac Risk Factors include: advanced age (>50men, >80 women);hypertension     Objective:    Today's Vitals   02/28/17 1609  BP: 120/72  Pulse: 83  Resp: 12  Temp: 98 F (36.7 C)  TempSrc: Oral  SpO2: 95%  Weight: 134 lb 12.8 oz (61.1 kg)  Height: 5\' 2"  (1.575 m)   Body mass index is 24.66 kg/m.   Current Medications (verified) Outpatient Encounter Prescriptions as of 02/28/2017  Medication Sig  . aspirin 81 MG tablet Take 81 mg by mouth daily.  . Calcium Carbonate-Vitamin D (CALTRATE 600+D) 600-400 MG-UNIT per tablet Take 1 tablet by mouth 2 (two) times daily.  . carvedilol (COREG) 6.25 MG tablet TAKE 1 TABLET TWICE A DAY  . levothyroxine (SYNTHROID, LEVOTHROID) 88 MCG tablet TAKE 1 TABLET DAILY  . lisinopril (PRINIVIL,ZESTRIL) 40 MG tablet Take 1 tablet (40 mg total) by mouth daily.  Marland Kitchen omeprazole (PRILOSEC) 20 MG capsule Take 1 capsule (20 mg total) by mouth daily.  . simvastatin (ZOCOR) 10 MG tablet TAKE 1 TABLET AT BEDTIME  . spironolactone (ALDACTONE) 25 MG tablet TAKE 1 TABLET DAILY  . [DISCONTINUED] Multiple Vitamin (MULTIVITAMIN) tablet Take 1 tablet by mouth daily.   No facility-administered encounter medications on file as of 02/28/2017.     Allergies (verified) Meloxicam   History: Past Medical History:  Diagnosis Date  . Arthritis   . Chicken pox   . Endometriosis   . Esophagitis    s/p esophageal dilatation  . GERD (gastroesophageal reflux disease)   . History of ovarian cyst   . Hypercholesterolemia   . Hypertension   . Hypothyroidism    Past Surgical History:  Procedure Laterality Date  . ABDOMINAL SURGERY     found to have an ovarian cyst and endometriosis  . APPENDECTOMY     Family History  Problem Relation Age of Onset  . Lung disease Father   . Heart attack Father    . Heart disease Mother     myocardial infarction  . Breast cancer Mother   . Heart attack Mother   . Hypertension Mother   . Hyperlipidemia Mother   . Breast cancer Sister   . Hypertension Sister   . Breast cancer Maternal Aunt   . Hypertension Brother   . Colon cancer Neg Hx    Social History   Occupational History  . Not on file.   Social History Main Topics  . Smoking status: Never Smoker  . Smokeless tobacco: Never Used  . Alcohol use No  . Drug use: No  . Sexual activity: No    Tobacco Counseling Counseling given: Not Answered   Activities of Daily Living In your present state of health, do you have any difficulty performing the following activities: 02/28/2017  Hearing? N  Vision? N  Difficulty concentrating or making decisions? N  Walking or climbing stairs? N  Dressing or bathing? N  Doing errands, shopping? N  Preparing Food and eating ? N  Using the Toilet? N  In the past six months, have you accidently leaked urine? N  Do you have problems with loss of bowel control? N  Managing your Medications? N  Managing your Finances? N  Housekeeping or managing your Housekeeping? N  Some recent data might be hidden    Immunizations  and Health Maintenance Immunization History  Administered Date(s) Administered  . Influenza-Unspecified 06/23/2013, 06/29/2014, 07/12/2015, 07/04/2016  . Pneumococcal Conjugate-13 08/04/2014  . Pneumococcal Polysaccharide-23 02/28/2017   Health Maintenance Due  Topic Date Due  . Samul Dada  12/10/1958    Patient Care Team: Einar Pheasant, MD as PCP - General (Internal Medicine)  Indicate any recent Medical Services you may have received from other than [facility] providers in the past year (date may be approximate).     Assessment:   This is a routine wellness examination for [name]. The goal of the wellness visit is to assist the patient how to close the gaps in care and create a preventative care plan for the patient.    Taking calcium VIT D as appropriate/Osteoporosis risk reviewed.  Medications reviewed; taking without issues or barriers.  Safety issues reviewed; smoke detectors in the home. No firearms in the home.  Wears seatbelts when driving or riding with others. Patient does wear sunscreen or protective clothing when in direct sunlight. No violence in the home.  Depression- PHQ 2 &9 complete.  No signs/symptoms or verbal communication regarding little pleasure in doing things, feeling down, depressed or hopeless. No changes in sleeping, energy, eating, concentrating.  No thoughts of self harm or harm towards others.  Time spent on this topic is 8 minutes.   Patient is alert, normal appearance, oriented to person/place/and time. Correctly identified the president of the [place], recall of 3/3 words, and performing simple calculations.  Patient displays appropriate judgement and can read correct time from watch face.  No new identified risk were noted.  No failures at ADL's or IADL's.   BMI- discussed the importance of a healthy diet, water intake and exercise. Educational material provided.   HTN- followed by PCP.  Dental- every six months.    Sleep patterns- Sleeps 6 hours at night.  Wakes feeling rested.  Pneumovax 23 vaccine administered L deltoid, tolerated well. Educational material provided.  TDAP vaccine deferred per patient preference.  Follow up with insurance.  Educational material provided.  Patient Concerns: None at this time. Follow up with PCP as needed.  Hearing/Vision screen Hearing Screening Comments: Patient is able to hear conversational tones without difficulty.  No issues reported.   Vision Screening Comments: Followed by [place] [facility] Wears corrective lenses Last OV 2017 Visual acuity not assessed per patient preference since they have regular follow up with the ophthalmologist  Dietary issues and exercise activities discussed: Current Exercise Habits:  Home exercise routine, Time (Minutes): 20, Frequency (Times/Week): 4, Weekly Exercise (Minutes/Week): 80, Intensity: Mild  Goals    . Increase physical activity          Stay active and walk for exercise       Depression Screen PHQ 2/9 Scores 02/28/2017 07/12/2016 01/10/2016 11/09/2014 08/04/2014  PHQ - 2 Score 0 0 0 0 0  PHQ- 9 Score 0 - - - -    Fall Risk Fall Risk  02/28/2017 07/12/2016 01/10/2016 11/09/2014 08/04/2014  Falls in the past year? No No No No No    Cognitive Function: MMSE - Mini Mental State Exam 02/28/2017  Orientation to time 5  Orientation to Place 5  Registration 3  Attention/ Calculation 5  Recall 3  Language- name 2 objects 2  Language- repeat 1  Language- follow 3 step command 3  Language- read & follow direction 1  Write a sentence 1  Copy design 1  Total score 30        Screening  Tests Health Maintenance  Topic Date Due  . TETANUS/TDAP  12/10/1958  . INFLUENZA VACCINE  05/23/2017  . MAMMOGRAM  02/09/2018  . DEXA SCAN  Completed  . PNA vac Low Risk Adult  Completed      Plan:    End of life planning; Advance aging; Advanced directives discussed. Copy of current HCPOA/Living Will requested.    I have personally reviewed and noted the following in the patient's chart:   . Medical and social history . Use of alcohol, tobacco or illicit drugs  . Current medications and supplements . Functional ability and status . Nutritional status . Physical activity . Advanced directives . List of other physicians . Hospitalizations, surgeries, and ER visits in previous 12 months . Vitals . Screenings to include cognitive, depression, and falls . Referrals and appointments  In addition, I have reviewed and discussed with patient certain preventive protocols, quality metrics, and best practice recommendations. A written personalized care plan for preventive services as well as general preventive health recommendations were provided to patient.      Varney Biles, LPN   0/02/3975

## 2017-03-01 ENCOUNTER — Telehealth: Payer: Self-pay | Admitting: Internal Medicine

## 2017-03-01 ENCOUNTER — Telehealth: Payer: Self-pay | Admitting: *Deleted

## 2017-03-01 NOTE — Telephone Encounter (Signed)
See additional noted, thanks

## 2017-03-01 NOTE — Progress Notes (Signed)
  I have reviewed the above information and agree with above.   Deb Loudin, MD 

## 2017-03-01 NOTE — Telephone Encounter (Signed)
Spoke with the patient, she is not wanting to go to a walk in clinic. She is afraid that she will get more issues going there due to germs and all.  I explained the rationale for going and she agreed, but declines.  She did tell me that her blood pressure at 11am was 115/65 and her HR was 98.  She is again contributing it to her episodes.  She did tell me that she took all three of her BP meds early this morning.  She believes that the "Blood " in her stool was not really blood but possibly the Pizza from last night. States No pains now, no diarrhea and no vomiting, just trying to rehydrate today, she was able to eat her light breakfast and it has stayed down.  Tired to again explain that she should get evaluated as we are really unsure the cause of the "syncope and GI symptoms" and she again said thanks but I have no desire to do that today.  I advised for her to continue to watch her intake  And let us know if she needs any thing.

## 2017-03-01 NOTE — Telephone Encounter (Signed)
Given the blood in the stool and the severity of her diarrhea (with syncope), I feel she needs to go ahead and be evaluated to confirm nothing more acute going on.  Recommend evaluation today.

## 2017-03-01 NOTE — Telephone Encounter (Signed)
Spoke with the patient, she came in yesterday for her AWV with the [facility] and she received a Pneumonia Vaccine.  I asked if the site was okay, no swelling noted, no redness, no complaints.  She states that she ate Pizza for dinner with her husband last night and went to get in bed.  Started having severe stomach pains and cramping and felt like she may need to use the bathroom.  Had numerous loose bowel movements and vomited several times.  She decided to go to another room in the house to not bother her husband and when she didn't return he got concerned.  He went looking for her and found her on the bathroom floor.  She doesn't recall falling, but knelling at the commode to get sick and she must have lay ed down.  Denies any injury to head, neck, back or extremities.  Husband assisted her back to there room.  She is very weak this morning and knows it is likely from the diarrhea and vomiting last night.  She is attempting to eat a light breakfast and was encouraged to increase her fluid intake today.  She denies a fever or any symptoms this am with the exception of the weakness.  I asked if she took her Prilosec yesterday and she confirmed she took at breakfast.  She was concerned that the pneumonia vaccine may have cause this, I explained that most of the time we see localized reactions at the site of the injection.  She verbalized understanding.  I explained that she may have a stomach virus and to watch in her intake today and that if her weakness increases that she may need to be seen and evaluated.  She agreed. Please advise, thanks

## 2017-03-01 NOTE — Telephone Encounter (Signed)
Since she is declining to be evaluated, agree with increased fluid intake, bland foods, advance slowly.  Monitor blood pressure.  If low blood pressure, persistent diarrhea, any bleeding, etc - she needs to be evaluated.  Have her give [place] an update tomorrow.

## 2017-03-01 NOTE — Telephone Encounter (Signed)
PT called back returning your call. Please advise, thank you!  Call pt @ [phone]

## 2017-03-01 NOTE — Telephone Encounter (Signed)
Patient Name: Crystal Lynch  DOB: July 24, 1941    Initial Comment diarrhea, severe stomach cramps, passed out this morning in the bathroom    Nurse Assessment  Nurse: Raphael Gibney, RN, Vanita Ingles Date/Time Eilene Ghazi Time): 03/01/2017 8:40:05 AM  Confirm and document reason for call. If symptomatic, describe symptoms. ---Caller states she started having diarrhea last night. She vomited several times. Her spouse found her in the bathroom passed out. She woke up when he called her name. She was in the office yesterday and got her immunizations updated. Had blood in her diarrhea. had severe abd cramps before she had diarrhea and vomited. No cramping now. She fainted around 2 am.  Does the patient have any new or worsening symptoms? ---Yes  Will a triage be completed? ---Yes  Related visit to physician within the last 2 weeks? ---No  Does the PT have any chronic conditions? (i.e. diabetes, asthma, etc.) ---Yes  List chronic conditions. ---HTN  Is this a behavioral health or substance abuse call? ---No     Guidelines    Guideline Title Affirmed Question Affirmed Notes  Fainting [1] Age > 50 years AND [2] now alert and feels fine    Final Disposition User   Go to ED Now (or PCP triage) Raphael Gibney, RN, Vanita Ingles    Comments  called back line and gave report that pt had vomiting and diarrhea last night. Had blood in her diarrhea. Her spouse found her in the bathroom passed out. Triage outcome of go to ER now (or PCP triage). Does not want to come into the office for appt or go to the ER.   Referrals  GO TO FACILITY REFUSED   Disagree/Comply: Disagree  Disagree/Comply Reason: Disagree with instructions

## 2017-03-01 NOTE — Telephone Encounter (Signed)
Patient currently has diarrhea and sever stomach cramps. Patient passed out in the bathroom this morning. Patient seems to think this is a subject to the injection she took in the office on 05/09.  *pt was transferred to [facility]

## 2017-03-01 NOTE — Telephone Encounter (Signed)
Attempted to reach patient, left a vm to return my call.

## 2017-03-02 NOTE — Telephone Encounter (Signed)
Attempted to call patient for update, cell phone has a busy signal and then house phone just keeps ringing. thanks

## 2017-03-05 ENCOUNTER — Telehealth: Payer: Self-pay | Admitting: Internal Medicine

## 2017-03-05 NOTE — Telephone Encounter (Signed)
Called patient back regarding symptoms listed below.  Outside of the original report, she states there was no persistent diarrhea, no bleeding, no fever or additional nausea/vomiting.  Blood pressure within range, taking  medication as directed, readings are not too low.  Fluid intake increased over the weekend without issues. Little to no food eaten over the weekend however appetite is better and has been tolerant of regular diet since Sunday.  Reports her arm turned red late Friday and was sore in the general area of administration.  Redness/tenderness absent as of Saturday.  States she now feels she is back to her normal self and would not like another pneumococcal vaccine going forward.

## 2017-03-05 NOTE — Telephone Encounter (Signed)
Returned patient call regarding the Pneumovax 23 vaccine administered on 02/28/17.  See previous phone note.

## 2017-03-05 NOTE — Telephone Encounter (Signed)
Thank you for the update!

## 2017-03-05 NOTE — Telephone Encounter (Signed)
Pt called about wanting to speak to you regarding her visit with you. Thank you!  Call pt @ [phone].

## 2017-03-22 NOTE — Telephone Encounter (Signed)
Seen [phone] 

## 2017-04-29 ENCOUNTER — Other Ambulatory Visit: Payer: Self-pay | Admitting: Cardiovascular Disease

## 2017-06-26 ENCOUNTER — Ambulatory Visit (INDEPENDENT_AMBULATORY_CARE_PROVIDER_SITE_OTHER): Payer: Medicare Other | Admitting: Cardiovascular Disease

## 2017-06-26 ENCOUNTER — Encounter: Payer: Self-pay | Admitting: Cardiovascular Disease

## 2017-06-26 VITALS — BP 112/60 | HR 73 | Ht 62.0 in | Wt 132.8 lb

## 2017-06-26 DIAGNOSIS — E785 Hyperlipidemia, unspecified: Secondary | ICD-10-CM

## 2017-06-26 DIAGNOSIS — I10 Essential (primary) hypertension: Secondary | ICD-10-CM

## 2017-06-26 MED ORDER — SPIRONOLACTONE 25 MG PO TABS: 25.0000 mg | ORAL_TABLET | Freq: Every day | ORAL | 3 refills | Status: DC

## 2017-06-26 MED ORDER — CARVEDILOL 6.25 MG PO TABS: 6.2500 mg | ORAL_TABLET | Freq: Two times a day (BID) | ORAL | 3 refills | Status: DC

## 2017-06-26 NOTE — Patient Instructions (Signed)
Medication Instructions: Continue same medications.   Labwork: None.   Procedures/Testing: None.   Follow-Up: 1 year with Dr. Hawken Bielby.   Any Additional Special Instructions Will Be Listed Below (If Applicable).     If you need a refill on your cardiac medications before your next appointment, please call your pharmacy.   

## 2017-06-26 NOTE — Progress Notes (Signed)
Cardiology Office Note   Date:  06/26/2017   ID:  Julee, Stoll [birth year], MRN [id number]  PCP:  Einar Pheasant, MD  Cardiologist:   Kathlyn Sacramento, MD   Chief Complaint  Patient presents with  . other    12 month follow up. Meds reviewed by the pt. verbally. "doing well."       History of Present Illness: Crystal Lynch is a 76 y.o. female who presents for  a follow-up visit regarding refractory  hypertension . She has no previous cardiac history. Other medical problems include hyperlipidemia and GERD.  Renal artery angiographyIn October 2016 showed no significant renal artery stenosis.  She has been doing extremely well and denies any chest pain, shortness of breath or palpitations. Blood pressure has been well-controlled on current medications.  She went on a trip to the [place] recently and walked a lot with no significant exertional symptoms.  Past Medical History:  Diagnosis Date  . Arthritis   . Chicken pox   . Endometriosis   . Esophagitis    s/p esophageal dilatation  . GERD (gastroesophageal reflux disease)   . History of ovarian cyst   . Hypercholesterolemia   . Hypertension   . Hypothyroidism     Past Surgical History:  Procedure Laterality Date  . ABDOMINAL SURGERY     found to have an ovarian cyst and endometriosis  . APPENDECTOMY       Current Outpatient Prescriptions  Medication Sig Dispense Refill  . aspirin 81 MG tablet Take 81 mg by mouth daily.    . Calcium Carbonate-Vitamin D (CALTRATE 600+D) 600-400 MG-UNIT per tablet Take 1 tablet by mouth 2 (two) times daily.    . carvedilol (COREG) 6.25 MG tablet TAKE 1 TABLET TWICE A DAY 180 tablet 3  . levothyroxine (SYNTHROID, LEVOTHROID) 88 MCG tablet TAKE 1 TABLET DAILY 90 tablet 1  . lisinopril (PRINIVIL,ZESTRIL) 40 MG tablet Take 1 tablet (40 mg total) by mouth daily. 90 tablet 3  . omeprazole (PRILOSEC) 20 MG capsule Take 1 capsule (20 mg total) by mouth daily. 90 capsule 1  .  simvastatin (ZOCOR) 10 MG tablet TAKE 1 TABLET AT BEDTIME 90 tablet 2  . spironolactone (ALDACTONE) 25 MG tablet TAKE 1 TABLET DAILY 90 tablet 3   No current facility-administered medications for this visit.     Allergies:   Meloxicam    Social History:  The patient  reports that she has never smoked. She has never used smokeless tobacco. She reports that she does not drink alcohol or use drugs.   Family History:  The patient's family history includes Breast cancer in her maternal aunt, mother, and sister; Heart attack in her father and mother; Heart disease in her mother; Hyperlipidemia in her mother; Hypertension in her brother, mother, and sister; Lung disease in her father.    ROS:  Please see the history of present illness.   Otherwise, review of systems are positive for none.   All other systems are reviewed and negative.    PHYSICAL EXAM: VS:  BP 112/60 (BP Location: Left Arm, Patient Position: Sitting, Cuff Size: Normal)   Pulse 73   Ht 5\' 2"  (1.575 m)   Wt 132 lb 12 oz (60.2 kg)   LMP 10/21/1984   BMI 24.28 kg/m  , BMI Body mass index is 24.28 kg/m. GEN: Well nourished, well developed, in no acute distress  HEENT: normal  Neck: no JVD, carotid bruits, or masses Cardiac: RRR; no murmurs,  rubs, or gallops,no edema  Respiratory:  clear to auscultation bilaterally, normal work of breathing GI: soft, nontender, nondistended, + BS MS: no deformity or atrophy  Skin: warm and dry, no rash Neuro:  Strength and sensation are intact Psych: euthymic mood, full affect   EKG:  EKG is ordered today. The ekg ordered today demonstrates  normal sinus rhythm with no significant ST or T wave changes.   Recent Labs: 07/07/2016: Hemoglobin 14.4; Platelets 243.0 01/04/2017: ALT 15; BUN 17; Creatinine, Ser 0.87; Potassium 4.2; Sodium 138 01/09/2017: TSH 1.80    Lipid Panel    Component Value Date/Time   CHOL 175 01/04/2017 0823   TRIG 255.0 (H) 01/04/2017 0823   HDL 45.60  01/04/2017 0823   CHOLHDL 4 01/04/2017 0823   VLDL 51.0 (H) 01/04/2017 0823   LDLCALC 87 07/07/2016 0808   LDLDIRECT 94.0 01/04/2017 0823      Wt Readings from Last 3 Encounters:  06/26/17 132 lb 12 oz (60.2 kg)  02/28/17 134 lb 12.8 oz (61.1 kg)  01/09/17 141 lb 3.2 oz (64 kg)       No flowsheet data found.    ASSESSMENT AND PLAN:  1.  Essential hypertension: Blood pressure is well controlled on current medications including carvedilol, lisinopril and spironolactone.Continue same medications. I reviewed her labs in March which were unremarkable.  2. Hyperlipidemia: Currently well controlled on current dose of simvastatin. Most recent LDL was 93.    Disposition:   FU with me in 1 year  Signed,  Kathlyn Sacramento, MD  06/26/2017 3:31 PM    [place]

## 2017-08-19 ENCOUNTER — Other Ambulatory Visit: Payer: Self-pay | Admitting: Internal Medicine

## 2017-09-04 ENCOUNTER — Encounter: Payer: Self-pay | Admitting: Physical Therapy

## 2017-09-04 ENCOUNTER — Other Ambulatory Visit: Payer: Self-pay | Admitting: Internal Medicine

## 2017-09-04 ENCOUNTER — Ambulatory Visit: Payer: Medicare Other | Attending: Rheumatology | Admitting: Physical Therapy

## 2017-09-04 ENCOUNTER — Other Ambulatory Visit: Payer: Self-pay

## 2017-09-04 DIAGNOSIS — M5440 Lumbago with sciatica, unspecified side: Secondary | ICD-10-CM | POA: Insufficient documentation

## 2017-09-04 DIAGNOSIS — M25552 Pain in left hip: Secondary | ICD-10-CM | POA: Insufficient documentation

## 2017-09-04 DIAGNOSIS — M6281 Muscle weakness (generalized): Secondary | ICD-10-CM | POA: Insufficient documentation

## 2017-09-05 NOTE — Therapy (Signed)
[facility] [address], [place], [address] Phone: [phone]   Fax:  [phone]  Physical Therapy Evaluation  Patient Details  Name: Crystal Lynch MRN: [id number] Date of Birth: 08/15/1941 Referring Provider: Meda Klinefelter., MD   Encounter Date: 09/04/2017  PT End of Session - 09/04/17 1111    Visit Number  1    Number of Visits  12    Date for PT Re-Evaluation  10/16/17    PT Start Time  1007    PT Stop Time  1109    PT Time Calculation (min)  62 min    Activity Tolerance  Patient tolerated treatment well    Behavior During Therapy  [facility] for tasks assessed/performed       Past Medical History:  Diagnosis Date  . Arthritis   . Chicken pox   . Endometriosis   . Esophagitis    s/p esophageal dilatation  . GERD (gastroesophageal reflux disease)   . History of ovarian cyst   . Hypercholesterolemia   . Hypertension   . Hypothyroidism     Past Surgical History:  Procedure Laterality Date  . ABDOMINAL SURGERY     found to have an ovarian cyst and endometriosis  . APPENDECTOMY      There were no vitals filed for this visit.   Subjective Assessment - 09/04/17 1028    Subjective  Patient reports intermittent pain in lower back and left LE posterior thigh    Pertinent History  Patient reports she began having pain in left hip/buttock noticed with sleeping on left side which began about 4-6 months ago. Since then she has had pain with sitting and pain now radiates down back of left LE to knee.     Limitations  Sitting;Standing;House hold activities    Patient Stated Goals  return to prior level of function without back/left LE pain    Currently in Pain?  No/denies pain ranges from 0-7/10         [facility] PT Assessment - 09/04/17 1019      Assessment   Medical Diagnosis  Trochanteric bursitis left hip, Chronic midline low back pain without sciatica     Referring Provider   Meda Klinefelter., MD    Onset Date/Surgical Date  03/23/17    Hand Dominance  Right    Prior Therapy  none      Precautions   Precautions  None      Balance Screen   Has the patient fallen in the past 6 months  No      [place] residence    Living Arrangements  Spouse/significant other    Type of [place] to enter    Entrance Stairs-Number of Steps  1    [place]  One level    [place]  None      Prior Function   Level of [place]  Retired    Leisure  family activities, going to ball games      Cognition   Overall Cognitive Status  Within Functional Limits for tasks assessed      Observation/Other Assessments   Modified [place]  30%    Lower Extremity Functional Scale   60/80      ROM / Strength   AROM / PROM /  Strength  AROM;PROM;Strength      AROM   Overall AROM Comments  limited bilateral hip ER right > left and no reproduction of symptoms      PROM   Overall PROM Comments  right hip ER 50% limited, left 25% limited      Strength   Overall Strength Comments  left hip decreased strength with pain reproduced with hip abduction and ER      Flexibility   Hamstrings  decreased bilaterally 60 degrees without reproduction of symptoms      FABER test   findings  Negative    Comment  bilateral, note right hip 50% limited ROM      Slump test   Findings  Negative    Comment  bilateral       Straight Leg Raise   Findings  Negative    Comment  bilateral             Objective measurements completed on examination: See above findings.       PT Education - 09/04/17 1044    Education provided  Yes    Education Details  POC, home program, use of back support, ice/heat, body mechanics for daily activities to decrease strain on back   Person(s) Educated  Patient    Methods  Verbal  cues;Demonstration;Explanation;Handout    Comprehension  Verbalized understanding;Verbal cues required;Returned demonstration          PT Long Term Goals - 09/04/17 1200      PT LONG TERM GOAL #1   Title  Patient will demonstrate improved function with daily tasks and decreased back and left LE symptoms/ pain as indicated by MODI score of 20% or less and LEFS 65/80 or better    Baseline  MODI 30%, LEFS 60/80    Status  New    Target Date  10/16/17      PT LONG TERM GOAL #2   Title  Patient will demonstrate improved posture awareness and pain control strategies to allow patient to sit/stand for >30 min. with mild to no discomfort, pain 3/10 or less    Baseline  pain ranges up to 7/10, unable to stand >30 min. without increased symptoms    Status  New    Target Date  09/26/17      PT LONG TERM GOAL #3   Title  Patient will be independent with home program for posture awareness, pain control, progressive exercises to allow patient to transition to self management once discharged from physical therapy    Baseline  limited knowledge of appropriate pain control strategies, exercises and progression to improve function and control pain    Status  New    Target Date  10/16/17             Plan - 09/04/17 1200    Clinical Impression Statement  Patient is a 76 year old female who presents with low back pain and left hip pain with findings consistent with sciatica and hip trochanteric bursitis. She has MODI of 30% and LEFS score of 60/80 indicating moderate self perceived disability. She has limited knoweldge of appropriate pain control strategies and exercise progression to improve function and will benefit from physical therapy intervention.     History and Personal Factors relevant to plan of care:  Patient reports she began having pain in left hip/buttock noticed with sleeping on left side which began about 4-6 months ago. Since then she has had pain with sitting and pain now radiates  down back of left LE to knee. patient was very active prior to this episode    Clinical Presentation  Evolving    Clinical Presentation due to:  began as back and hip pain and is now radiating to knee    Clinical Decision Making  Low    Rehab Potential  Good    Clinical Impairments Affecting Rehab Potential  (+)motivated, acute condition, prior level of function(-)arthritis    PT Frequency  2x / week    PT Duration  6 weeks    PT Treatment/Interventions  Electrical Stimulation;Cryotherapy;Iontophoresis 4mg /ml Dexamethasone;Moist Heat;Ultrasound;Therapeutic exercise;Neuromuscular re-education;Patient/family education;Manual techniques    PT Next Visit Plan  pain control, therapeutic exercises    PT Home Exercise Plan  pain control, body mechanics to decrease strain on hip/back    Consulted and Agree with Plan of Care  Patient       Patient will benefit from skilled therapeutic intervention in order to improve the following deficits and impairments:  Pain, Increased muscle spasms, Decreased activity tolerance, Decreased range of motion, Decreased strength, Impaired perceived functional ability, Impaired flexibility, Difficulty walking  Visit Diagnosis: Low back pain with sciatica, sciatica laterality unspecified, unspecified back pain laterality, unspecified chronicity - Plan: PT plan of care cert/re-cert  Muscle weakness (generalized) - Plan: PT plan of care cert/re-cert  Pain in left hip - Plan: PT plan of care cert/re-cert  G-Codes - 38/18/29 1624    Functional Assessment Tool Used (Outpatient Only)  LEFS, strenth, pain, ROM, MODI, clinical judgment    Functional Limitation  Mobility: Walking and moving around    Mobility: Walking and Moving Around Current Status ([id number])  At least 40 percent but less than 60 percent impaired, limited or restricted    Mobility: Walking and Moving Around Goal Status [phone])  At least 1 percent but less than 20 percent impaired, limited or restricted         Problem List Patient Active Problem List   Diagnosis Date Noted  . Hypercalcemia 01/09/2017  . 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 Sleeping difficulties 03/08/2015  . Health care maintenance 01/10/2015  . Osteopenia 08/10/2014  . Hyperbilirubinemia 10/27/2012  . Hematuria 10/27/2012  . Hypertension 10/21/2012  . Hypercholesterolemia 10/21/2012  . Hypothyroidism 10/21/2012  . Esophagitis 10/21/2012    Jomarie Longs PT 09/05/2017, 4:34 PM  [facility] [address] [place], [address] Phone: [phone]   Fax:  [phone]  Name: Crystal Lynch MRN: [id number] Date of Birth: July 06, 1941

## 2017-09-06 ENCOUNTER — Ambulatory Visit: Payer: Medicare Other | Admitting: Physical Therapy

## 2017-09-10 ENCOUNTER — Ambulatory Visit: Payer: Medicare Other | Admitting: Physical Therapy

## 2017-09-12 ENCOUNTER — Other Ambulatory Visit: Payer: Self-pay | Admitting: Internal Medicine

## 2017-09-12 ENCOUNTER — Encounter: Payer: Self-pay | Admitting: Physical Therapy

## 2017-09-12 ENCOUNTER — Ambulatory Visit: Payer: Medicare Other | Admitting: Physical Therapy

## 2017-09-12 DIAGNOSIS — M6281 Muscle weakness (generalized): Secondary | ICD-10-CM

## 2017-09-12 DIAGNOSIS — M25552 Pain in left hip: Secondary | ICD-10-CM

## 2017-09-12 DIAGNOSIS — M5440 Lumbago with sciatica, unspecified side: Secondary | ICD-10-CM | POA: Diagnosis not present

## 2017-09-12 NOTE — Therapy (Signed)
[facility] [address], [place], [address] Phone: [phone]   Fax:  [phone]  Physical Therapy Treatment  Patient Details  Name: Crystal Lynch MRN: [id number] Date of Birth: May 08, 1941 Referring Provider: Meda Klinefelter., MD   Encounter Date: 09/12/2017  PT End of Session - 09/12/17 0950    Visit Number  2    Number of Visits  12    Date for PT Re-Evaluation  10/16/17    Authorization Type  2    Authorization Time Period  10 (G code)    PT Start Time  [phone]    PT Stop Time  1032    PT Time Calculation (min)  45 min    Activity Tolerance  Patient tolerated treatment well    Behavior During Therapy  [facility] for tasks assessed/performed       Past Medical History:  Diagnosis Date  . Arthritis   . Chicken pox   . Endometriosis   . Esophagitis    s/p esophageal dilatation  . GERD (gastroesophageal reflux disease)   . History of ovarian cyst   . Hypercholesterolemia   . Hypertension   . Hypothyroidism     Past Surgical History:  Procedure Laterality Date  . ABDOMINAL SURGERY     found to have an ovarian cyst and endometriosis  . APPENDECTOMY      There were no vitals filed for this visit.  Subjective Assessment - 09/12/17 0948    Subjective  Patient reports she is compliant with exercises and trying to watch body mechanics.     Pertinent History  Patient reports she began having pain in left hip/buttock noticed with sleeping on left side which began about 4-6 months ago. Since then she has had pain with sitting and pain now radiates down back of left LE to knee.     Limitations  Sitting;Standing;House hold activities    Patient Stated Goals  return to prior level of function without back/left LE pain    Currently in Pain?  Other (Comment) Patient without pain and no flare ups in past week       Objective:  Posture: WNL Strength: side lying right: left hip abduction strong  without pain, ER strong without pain Palpation: point tender with spasms palpable left gluteal/pirirformis muscles  Treatment:  Therapeutic exercise: Patient performed with instruction, verbal and tactile cues of therapist: goal: independent with home program, improve MODI, LEFS  Sitting:  Hip adduction with ball and glute sets x 15 reps, hold 2-3 seconds Hip abuction with red resistive band x 10 reps and clam with red resistive  Band x 15 reps Standing side step with red band 2-3 steps to right and then left x 2 min. With  Verbal cues and repeated instruction for appropriate technique Walk forward and backwards along counter x 2 min. With instruction for correct technique, slow controlled motion   Modalities: Electrical stimulation: 15 min. :High volt estim.clincial program for muscle spasms  (2) electrodes applied to left side lower back and gluteal/piriformis region with moist heat, intensity to tolerance with patient right side lying with pillow between knees goal: pain, spasms    Patient response to treatment: Patient demonstrated improved technique with exercises with moderate demonstration and VC for correct alignment. Improved spasm/tenderness to mild/none at end of session, no adverse reactions to moist heat/estim. noted.      PT Education - 09/12/17 1039    Education provided  Yes  Education Details  home exercises added sitting clam, hip abduction with band, stading side step with band, forward, backward walking, hip adduction with ball    Person(s) Educated  Patient    Methods  Explanation;Demonstration;Verbal cues;Handout    Comprehension  Verbalized understanding;Returned demonstration;Verbal cues required          PT Long Term Goals - 09/04/17 1200      PT LONG TERM GOAL #1   Title  Patient will demonstrate improved function with daily tasks and decreased back and left LE symptoms/ pain as indicated by MODI score of 20% or less and LEFS 65/80 or better     Baseline  MODI 30%, LEFS 60/80    Status  New    Target Date  10/16/17      PT LONG TERM GOAL #2   Title  Patient will demonstrate improved posture awareness and pain control strategies to allow patient to sit/stand for >30 min. with mild to no discomfort, pain 3/10 or less    Baseline  pain ranges up to 7/10, unable to stand >30 min. without increased symptoms    Status  New    Target Date  09/26/17      PT LONG TERM GOAL #3   Title  Patient will be independent with home program for posture awareness, pain control, progressive exercises to allow patient to transition to self management once discharged from physical therapy    Baseline  limited knowledge of appropriate pain control strategies, exercises and progression to improve function and control pain    Status  New    Target Date  10/16/17            Plan - 09/12/17 1041    Clinical Impression Statement  Improved strength with decreased pain with hip abduction left. progressing exercises without exacerbation of symptoms. Good response to high volt estim. With decreased spasms and tenderness. Patient should continue to improve and progress exercises with additional physical therapy intervention.     Rehab Potential  Good    Clinical Impairments Affecting Rehab Potential  (+)motivated, acute condition, prior level of function(-)arthritis    PT Frequency  2x / week    PT Duration  6 weeks    PT Treatment/Interventions  Electrical Stimulation;Cryotherapy;Iontophoresis 4mg /ml Dexamethasone;Moist Heat;Ultrasound;Therapeutic exercise;Neuromuscular re-education;Patient/family education;Manual techniques    PT Next Visit Plan  pain control, therapeutic exercises    PT Home Exercise Plan  pain control, body mechanics to decrease strain on hip/back       Patient will benefit from skilled therapeutic intervention in order to improve the following deficits and impairments:  Pain, Increased muscle spasms, Decreased activity tolerance,  Decreased range of motion, Decreased strength, Impaired perceived functional ability, Impaired flexibility, Difficulty walking  Visit Diagnosis: Low back pain with sciatica, sciatica laterality unspecified, unspecified back pain laterality, unspecified chronicity  Muscle weakness (generalized)  Pain in left hip     Problem List Patient Active Problem List   Diagnosis Date Noted  . Hypercalcemia 01/09/2017  . 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 Sleeping difficulties 03/08/2015  . Health care maintenance 01/10/2015  . Osteopenia 08/10/2014  . Hyperbilirubinemia 10/27/2012  . Hematuria 10/27/2012  . Hypertension 10/21/2012  . Hypercholesterolemia 10/21/2012  . Hypothyroidism 10/21/2012  . Esophagitis 10/21/2012    Jomarie Longs PT 09/13/2017, 1:48 PM  [facility] [address], [place], [address] Phone: [phone]   Fax:  [phone]  Name: Crystal Lynch MRN: [id number] Date  of Birth: 03-12-1941

## 2017-09-17 ENCOUNTER — Ambulatory Visit: Payer: Medicare Other | Admitting: Physical Therapy

## 2017-09-19 ENCOUNTER — Encounter: Payer: Medicare Other | Admitting: Physical Therapy

## 2017-09-25 ENCOUNTER — Encounter: Payer: Medicare Other | Admitting: Physical Therapy

## 2017-09-26 ENCOUNTER — Ambulatory Visit: Payer: Medicare Other | Attending: Rheumatology | Admitting: Physical Therapy

## 2017-09-26 ENCOUNTER — Encounter: Payer: Self-pay | Admitting: Physical Therapy

## 2017-09-26 DIAGNOSIS — M5440 Lumbago with sciatica, unspecified side: Secondary | ICD-10-CM | POA: Diagnosis present

## 2017-09-26 DIAGNOSIS — M6281 Muscle weakness (generalized): Secondary | ICD-10-CM | POA: Diagnosis present

## 2017-09-26 DIAGNOSIS — M25552 Pain in left hip: Secondary | ICD-10-CM | POA: Diagnosis present

## 2017-09-27 NOTE — Therapy (Signed)
[facility] [address], [place], [address] Phone: [phone]   Fax:  [phone]  Physical Therapy Treatment/Discharge Summary  Patient Details  Name: Crystal Lynch MRN: [id number] Date of Birth: [birth year] Referring Provider: Meda Klinefelter., MD   Encounter Date: 09/26/2017   Patient began physical therapy on 09/04/17 and has attended 3 sessions through 09/26/2017. She has achieved all goals and is independent in home program for continued self management of pain/symptoms and exercises as instructed. Plan discharge from physical therapy at this time.    PT End of Session - 09/27/17 1200    Visit Number  3    Number of Visits  12    Date for PT Re-Evaluation  10/16/17    Authorization Type  3    Authorization Time Period  10 (G code)    PT Start Time  1001    PT Stop Time  1043    PT Time Calculation (min)  42 min    Activity Tolerance  Patient tolerated treatment well    Behavior During Therapy  WFL for tasks assessed/performed       Past Medical History:  Diagnosis Date  . Arthritis   . Chicken pox   . Endometriosis   . Esophagitis    s/p esophageal dilatation  . GERD (gastroesophageal reflux disease)   . History of ovarian cyst   . Hypercholesterolemia   . Hypertension   . Hypothyroidism     Past Surgical History:  Procedure Laterality Date  . ABDOMINAL SURGERY     found to have an ovarian cyst and endometriosis  . APPENDECTOMY      There were no vitals filed for this visit.  Subjective Assessment - 09/26/17 1010    Subjective  Patient reports she is working well with exercises and feels able to continue with independent home program.    Pertinent History  Patient reports she began having pain in left hip/buttock noticed with sleeping on left side which began about 4-6 months ago. Since then she has had pain with sitting and pain now radiates down back of left LE to  knee.     Limitations  Sitting;Standing;House hold activities    Patient Stated Goals  return to prior level of function without back/left LE pain    Currently in Pain?  No/denies only pain with sitiing on hard surfaces         Objective:  Palpation: mild to no tenderness with palpation left hip region Outcome measure: LEFS 65/80; MODI 15%  Treatment:  Therapeutic exercise: Patient performed with instruction, verbal and tactile cues of therapist: goal: independent with home program, improve MODI, LEFS  Sitting:  Hip adduction with ball and glute sets x 15 reps, hold 2-3 seconds Hip abuction with red resistive band x 10 reps and clam with red resistive  Band x 15 reps Walk forward and backwards against resistive band and side stepping against resistive band x 5 reps each direction with demonstration and  instruction for correct technique, slow controlled motion Sit to stand from elevated surface holding (2) 3# weights in hand x 10 reps with VC Standing with band around thighs: hip abduction and extension 3 reps x 3 sets   Patient response to treatment: Patient demonstrated good technique with exercises with moderate verbal cuing and demonstration. Motor control improved with repetition.          PT Education - 09/27/17 1158    Education  provided  Yes    Education Details  exercise instruction with correct technique and continued exercises for home; added resistive band walking, sit to stand holding weight in hands; standing hip abduction/extension with band around thighs    Person(s) Educated  Patient    Methods  Explanation;Demonstration;Verbal cues;Handout    Comprehension  Verbalized understanding;Returned demonstration;Verbal cues required          PT Long Term Goals - 10/23/17 1215      PT LONG TERM GOAL #1   Title  Patient will demonstrate improved function with daily tasks and decreased back and left LE symptoms/ pain as indicated by MODI score of 20% or less and  LEFS 65/80 or better    Baseline  MODI 30%, LEFS 60/80; 2017-10-23 MODI 15% LEFS 65/80    Status  Achieved      PT LONG TERM GOAL #2   Title  Patient will demonstrate improved posture awareness and pain control strategies to allow patient to sit/stand for >30 min. with mild to no discomfort, pain 3/10 or less    Baseline  pain ranges up to 7/10, unable to stand >30 min. without increased symptoms; 10/23/17 improved to mild pain, ablet to perform sit/stand with 3/10 or less    Status  Achieved      PT LONG TERM GOAL #3   Title  Patient will be independent with home program for posture awareness, pain control, progressive exercises to allow patient to transition to self management once discharged from physical therapy    Baseline  limited knowledge of appropriate pain control strategies, exercises and progression to improve function and control pain    Status  Achieved            Plan - Oct 23, 2017 1050    Clinical Impression Statement  Patient demosntrates good understanding of home exercises and has achieved all goals. She is ready for discharge from physical therapy at this time.     Rehab Potential  Good    Clinical Impairments Affecting Rehab Potential  (+)motivated, acute condition, prior level of function(-)arthritis    PT Frequency  2x / week    PT Duration  6 weeks    PT Treatment/Interventions  Electrical Stimulation;Cryotherapy;Iontophoresis 4mg /ml Dexamethasone;Moist Heat;Ultrasound;Therapeutic exercise;Neuromuscular re-education;Patient/family education;Manual techniques    PT Next Visit Plan  discharge to independent home program/self management    PT Home Exercise Plan  pain control, body mechanics to decrease strain on hip/back    Consulted and Agree with Plan of Care  Patient       Patient will benefit from skilled therapeutic intervention in order to improve the following deficits and impairments:  Pain, Increased muscle spasms, Decreased activity tolerance, Decreased range  of motion, Decreased strength, Impaired perceived functional ability, Impaired flexibility, Difficulty walking  Visit Diagnosis: Low back pain with sciatica, sciatica laterality unspecified, unspecified back pain laterality, unspecified chronicity  Muscle weakness (generalized)  Pain in left hip   G-Codes - 10/23/2017 1811    Functional Assessment Tool Used (Outpatient Only)  LEFS, strenth, pain, ROM, MODI, clinical judgment    Functional Limitation  Mobility: Walking and moving around    Mobility: Walking and Moving Around Goal Status [phone])  At least 1 percent but less than 20 percent impaired, limited or restricted    Mobility: Walking and Moving Around Discharge Status [phone])  At least 1 percent but less than 20 percent impaired, limited or restricted       Problem List Patient Active Problem List  Diagnosis Date Noted  . Hypercalcemia 01/09/2017  . 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 Sleeping difficulties 03/08/2015  . Health care maintenance 01/10/2015  . Osteopenia 08/10/2014  . Hyperbilirubinemia 10/27/2012  . Hematuria 10/27/2012  . Hypertension 10/21/2012  . Hypercholesterolemia 10/21/2012  . Hypothyroidism 10/21/2012  . Esophagitis 10/21/2012    Jomarie Longs PT 09/27/2017, 6:13 PM  [facility] [address], [place], [address] Phone: [phone]   Fax:  [phone]  Name: Crystal Lynch MRN: [id number] Date of Birth: 11/11/1940

## 2017-10-01 ENCOUNTER — Ambulatory Visit: Payer: Medicare Other | Admitting: Physical Therapy

## 2017-10-09 ENCOUNTER — Encounter: Payer: Medicare Other | Admitting: Physical Therapy

## 2017-10-17 ENCOUNTER — Telehealth: Payer: Self-pay | Admitting: Internal Medicine

## 2017-10-17 NOTE — Telephone Encounter (Signed)
Copied from [facility] [id number]. Topic: Quick Communication - Rx Refill/Question >> Oct 17, 2017  8:31 AM Arletha Grippe wrote: Has the patient contacted their pharmacy? No.   (Agent: If no, request that the patient contact the pharmacy for the refill.)   Preferred Pharmacy (with phone number or street name): pt feels she has uti. She does not want to come in today. She is having blood in urine and would like something called in.  [facility] on [address] Cb # [phone]    Agent: Please be advised that RX refills may take up to 3 business days. We ask that you follow-up with your pharmacy.

## 2017-10-17 NOTE — Telephone Encounter (Signed)
My chart message has been sent to patient informing them that she must have appointment to receive medication for possible UTI

## 2017-11-09 ENCOUNTER — Other Ambulatory Visit: Payer: Self-pay

## 2017-11-09 ENCOUNTER — Telehealth: Payer: Self-pay | Admitting: Cardiovascular Disease

## 2017-11-09 ENCOUNTER — Encounter: Payer: Self-pay | Admitting: Internal Medicine

## 2017-11-09 MED ORDER — LISINOPRIL 40 MG PO TABS: 40.0000 mg | ORAL_TABLET | Freq: Two times a day (BID) | ORAL | 3 refills | Status: DC

## 2017-11-09 NOTE — Telephone Encounter (Signed)
Increase lisinopril to 40 mg twice daily.  If blood pressure remains elevated, we might need to add amlodipine.

## 2017-11-09 NOTE — Telephone Encounter (Signed)
Pt with hx of HTN; often has difficulty controlling BP  On Jan 8, she mentioned morning elevated BPs to Dr. Fletcher Anon while with her spouse at his OV. Dr. Fletcher Anon advised coreg 8am and 8pm and lisinopril at 8pm (switched from morning to evening); spironolactone in the morning.   She still does not feel BP is well-controlled overnight and reports the following readings:  Jan 11, 7am               165/81 (before medications)              8:30pm          144/72  Jan 14, 8am              142/77  6pm   169/82  Jan 18, 7am (today)  158/81 (before medications)  11:30am  147/74  HR ranges 55-70 bpm   Denies any other sx except occasional lightheaded/dizzy if she turns head quickly.  Advised HR would most likely not support coreg increase but will route to Dr. Fletcher Anon regarding increasing lisinopril. Pt agreeable and appreciative.

## 2017-11-09 NOTE — Telephone Encounter (Signed)
Pt c/o BP issue: STAT if pt c/o blurred vision, one-sided weakness or slurred speech  1. What are your last 5 BP readings?  158/81 this morning   2. Are you having any other symptoms (ex. Dizziness, headache, blurred vision, passed out)? Lightheadedness   3. What is your BP issue? Elevated bp even after medication increase

## 2017-11-09 NOTE — Telephone Encounter (Signed)
Reviewed recommendations with patient who verbalized understanding. Per pt request, refills have been sent to [facility]. She will monitor BP x 1 week and call if BP not improved. Pt understands she may call the on-call this weekend with questions/concerns.

## 2017-11-15 ENCOUNTER — Telehealth: Payer: Self-pay | Admitting: Cardiovascular Disease

## 2017-11-15 NOTE — Telephone Encounter (Signed)
I am not really sure how her symptoms are due to ACE I. She has been on this medication for a long time. Lisinopril is very safe. I have some of my own family members on it.

## 2017-11-15 NOTE — Telephone Encounter (Signed)
Pt states she went to Dr. Tami Ribas for her inner ear yesterday, and she has a question regarding her BP medication.

## 2017-11-15 NOTE — Telephone Encounter (Signed)
I spoke with the patient. She reports she woke up yesterday with dizziness that she thought was related to inner ear/ a sinus infection- Dr. Tami Ribas saw her yesterday to evaluate her. Per the patient, she did not have an inner ear issue/ or a sinus infection. Dr. Tami Ribas did raise the question that her symptoms may be related to her ACE-I. Dr. Fletcher Anon just gave orders on 11/09/17 for the patient to increase lisinopril to 40 mg BID for her uncontrolled blood pressures. Per the patient, Dr. Tami Ribas was going to send Dr. Fletcher Anon a message asking that he take her off the lisinopril- she states Dr. Tami Ribas advised he would not want any of his family members on an ACE-I.   She states she trusts Dr. Fletcher Anon completely and if he felt she needed to stay on this, she will do so. She reports recent BP's: 1/23 10:00 am- 128/65 6:30 pm- 169/80 1/24 7:30 am- 144/80  I advised her I will send to Dr. Fletcher Anon to review and we will call her back with further recommendations.  She is agreeable.

## 2017-11-16 NOTE — Telephone Encounter (Signed)
Patient verbalized understanding that ok to continue lisinopril. She was very appreciative and trusts Dr Fletcher Anon completely.

## 2017-11-23 ENCOUNTER — Telehealth: Payer: Self-pay | Admitting: Cardiovascular Disease

## 2017-11-23 DIAGNOSIS — I10 Essential (primary) hypertension: Secondary | ICD-10-CM

## 2017-11-23 NOTE — Telephone Encounter (Signed)
Pt calling stating she is still having issues with her BP being elevated and still having dizzy spells   Pt c/o BP issue: STAT if pt c/o blurred vision, one-sided weakness or slurred speech  1. What are your last 5 BP readings?  11/19/17 139/81 11/20/17 151/77 11/21/17 168/83 11/22/17 150/83 11/23/17 151/75   2. Are you having any other symptoms (ex. Dizziness, headache, blurred vision, passed out)?  Dizzy spells 3. What is your BP issue?  Still elevated

## 2017-11-23 NOTE — Telephone Encounter (Signed)
I spoke with the patient.  She reports continued dizziness since Dr. Fletcher Anon increased her lisinopril dose on 11/09/17 to 40 mg BID.  She reports doing ok on the 1 time a day dosing, but her BP was not well controlled. Per the patient, she takes her BP periodically through out the day. I advised her we really want to know what she is running on her medication. She states that on average over the month of January, she has been running 282-081 systolic with/ with out meds.  I advised her that the lisinopril itself may be contributing to her dizziness, but the only way to now this would be to decrease the dose back down and supplement her BP with another medication, or take her off of lisinopril altogether and come up with a new regimen.  She states she will do whatever Dr. Fletcher Anon recommends.  I advised I will send to him for review and recommendations and we will call her back. She did state she had trouble with getting her BP under control until Dr. Fletcher Anon came up with the current combination of what she is on- this has been working until recently.

## 2017-11-26 MED ORDER — LISINOPRIL 40 MG PO TABS: 40.0000 mg | ORAL_TABLET | Freq: Every day | ORAL | 3 refills | Status: DC

## 2017-11-26 MED ORDER — AMLODIPINE BESYLATE 5 MG PO TABS: 5.0000 mg | ORAL_TABLET | Freq: Every day | ORAL | 3 refills | Status: DC

## 2017-11-26 NOTE — Telephone Encounter (Signed)
Decrease lisinopril to 40 mg daily. Add Amlodipine 5 mg daily. Check BMP this week.

## 2017-11-26 NOTE — Telephone Encounter (Signed)
Patient calling back. She verbalized understanding to decrease lisinopril to 40 mg once a day, start amlodipine 5 mg daily [facility]. Rx for amlodipine sent to local pharmacy.

## 2017-11-26 NOTE — Telephone Encounter (Signed)
No answer. Left message to call back.   

## 2017-12-04 ENCOUNTER — Other Ambulatory Visit
Admission: RE | Admit: 2017-12-04 | Discharge: 2017-12-04 | Disposition: A | Discharge status: Home / Self Care | Payer: Medicare Other | Source: Ambulatory Visit | Attending: Cardiovascular Disease | Admitting: Cardiovascular Disease

## 2017-12-04 DIAGNOSIS — I10 Essential (primary) hypertension: Secondary | ICD-10-CM | POA: Diagnosis present

## 2017-12-04 LAB — BASIC METABOLIC PANEL
ANION GAP: 7 (ref 5–15)
BUN: 15 mg/dL (ref 6–20)
CHLORIDE: 104 mmol/L (ref 101–111)
CO2: 25 mmol/L (ref 22–32)
Calcium: 10.3 mg/dL (ref 8.9–10.3)
Creatinine, Ser: 0.86 mg/dL (ref 0.44–1.00)
GFR calc Af Amer: >60 mL/min (ref >60)
Glucose, Bld: 102 mg/dL — ABNORMAL HIGH (ref 65–99)
POTASSIUM: 4.2 mmol/L (ref 3.5–5.1)
SODIUM: 136 mmol/L (ref 135–145)

## 2018-01-22 ENCOUNTER — Encounter: Payer: Self-pay | Admitting: Internal Medicine

## 2018-01-22 ENCOUNTER — Ambulatory Visit (INDEPENDENT_AMBULATORY_CARE_PROVIDER_SITE_OTHER): Payer: Medicare Other | Admitting: Internal Medicine

## 2018-01-22 VITALS — BP 112/60 | HR 70 | Temp 97.6°F | Resp 15 | Ht 62.0 in | Wt 134.0 lb

## 2018-01-22 DIAGNOSIS — E7800 Pure hypercholesterolemia, unspecified: Secondary | ICD-10-CM

## 2018-01-22 DIAGNOSIS — M545 Low back pain, unspecified: Secondary | ICD-10-CM

## 2018-01-22 DIAGNOSIS — K209 Esophagitis, unspecified without bleeding: Secondary | ICD-10-CM

## 2018-01-22 DIAGNOSIS — Z0000 Encounter for general adult medical examination without abnormal findings: Secondary | ICD-10-CM | POA: Diagnosis not present

## 2018-01-22 DIAGNOSIS — Z1239 Encounter for other screening for malignant neoplasm of breast: Secondary | ICD-10-CM

## 2018-01-22 DIAGNOSIS — R319 Hematuria, unspecified: Secondary | ICD-10-CM | POA: Diagnosis not present

## 2018-01-22 DIAGNOSIS — Z1231 Encounter for screening mammogram for malignant neoplasm of breast: Secondary | ICD-10-CM

## 2018-01-22 DIAGNOSIS — I10 Essential (primary) hypertension: Secondary | ICD-10-CM | POA: Diagnosis not present

## 2018-01-22 DIAGNOSIS — E039 Hypothyroidism, unspecified: Secondary | ICD-10-CM | POA: Diagnosis not present

## 2018-01-22 LAB — URINALYSIS, ROUTINE W REFLEX MICROSCOPIC
Bilirubin Urine: NEGATIVE
Ketones, ur: NEGATIVE
Nitrite: NEGATIVE
SPECIFIC GRAVITY, URINE: 1.015 (ref 1.000–1.030)
Total Protein, Urine: NEGATIVE
URINE GLUCOSE: NEGATIVE
Urobilinogen, UA: 0.2 (ref 0.0–1.0)
pH: 5.5 (ref 5.0–8.0)

## 2018-01-22 NOTE — Progress Notes (Signed)
Patient ID: Crystal Lynch, female   DOB: 06/28/1941, 77 y.o.   MRN: [id number]   Subjective:    Patient ID: Crystal Lynch, female    DOB: 11/29/1940, 77 y.o.   MRN: [id number]  HPI  Patient here for her physical exam.  She reports she is doing relatively well.  Saw Dr Jefm Bryant.  Diagnosed with trochanteric bursitis.  Was taking advil.  Therapy.  S/p injection.  Doing better.  Walking.  No chest pain.  No sob.  No acid reflux.  No abdominal pain.  Bowels moving.  No urine change.  Blood pressures have been doing well now per pt report.     Past Medical History:  Diagnosis Date  . Arthritis   . Chicken pox   . Endometriosis   . Esophagitis    s/p esophageal dilatation  . GERD (gastroesophageal reflux disease)   . History of ovarian cyst   . Hypercholesterolemia   . Hypertension   . Hypothyroidism    Past Surgical History:  Procedure Laterality Date  . ABDOMINAL SURGERY     found to have an ovarian cyst and endometriosis  . APPENDECTOMY     Family History  Problem Relation Age of Onset  . Lung disease Father   . Heart attack Father   . Heart disease Mother        myocardial infarction  . Breast cancer Mother   . Heart attack Mother   . Hypertension Mother   . Hyperlipidemia Mother   . Breast cancer Sister   . Hypertension Sister   . Breast cancer Maternal Aunt   . Hypertension Brother   . Colon cancer Neg Hx    Social History   Socioeconomic History  . Marital status: Married    Spouse name: Not on file  . Number of children: Not on file  . Years of education: Not on file  . Highest education level: Not on file  Occupational History  . Not on file  Social Needs  . Financial resource strain: Not on file  . Food insecurity:    Worry: Not on file    Inability: Not on file  . Transportation needs:    Medical: Not on file    Non-medical: Not on file  Tobacco Use  . Smoking status: Never Smoker  . Smokeless tobacco: Never Used  Substance and Sexual  Activity  . Alcohol use: No    Alcohol/week: 0.0 oz  . Drug use: No  . Sexual activity: Never  Lifestyle  . Physical activity:    Days per week: Not on file    Minutes per session: Not on file  . Stress: Not on file  Relationships  . Social connections:    Talks on phone: Not on file    Gets together: Not on file    Attends religious service: Not on file    Active member of club or organization: Not on file    Attends meetings of clubs or organizations: Not on file    Relationship status: Not on file  Other Topics Concern  . Not on file  Social History Narrative  . Not on file    Outpatient Encounter Medications as of 01/22/2018  Medication Sig  . amLODipine (NORVASC) 5 MG tablet Take 1 tablet (5 mg total) by mouth daily.  Marland Kitchen aspirin 81 MG tablet Take 81 mg by mouth daily.  . Calcium Carbonate-Vitamin D (CALTRATE 600+D) 600-400 MG-UNIT per tablet Take 1 tablet by mouth 2 (two)  times daily.  . carvedilol (COREG) 6.25 MG tablet Take 1 tablet (6.25 mg total) by mouth 2 (two) times daily.  Marland Kitchen levothyroxine (SYNTHROID, LEVOTHROID) 88 MCG tablet TAKE 1 TABLET DAILY  . lisinopril (PRINIVIL,ZESTRIL) 40 MG tablet Take 1 tablet (40 mg total) by mouth daily.  Marland Kitchen omeprazole (PRILOSEC) 20 MG capsule TAKE 1 CAPSULE DAILY  . simvastatin (ZOCOR) 10 MG tablet TAKE 1 TABLET AT BEDTIME  . spironolactone (ALDACTONE) 25 MG tablet Take 1 tablet (25 mg total) by mouth daily.   No facility-administered encounter medications on file as of 01/22/2018.     Review of Systems  Constitutional: Negative for appetite change and unexpected weight change.  HENT: Negative for congestion and sinus pressure.   Eyes: Negative for pain and visual disturbance.  Respiratory: Negative for cough, chest tightness and shortness of breath.   Cardiovascular: Negative for chest pain, palpitations and leg swelling.  Gastrointestinal: Negative for abdominal pain, diarrhea, nausea and vomiting.  Genitourinary: Negative for  difficulty urinating and dysuria.  Musculoskeletal: Negative for joint swelling and myalgias.  Skin: Negative for color change and rash.  Neurological: Negative for dizziness, light-headedness and headaches.  Hematological: Negative for adenopathy. Does not bruise/bleed easily.  Psychiatric/Behavioral: Negative for agitation and dysphoric mood.       Objective:     Blood pressure rechecked by me:  126/70  Physical Exam  Constitutional: She is oriented to person, place, and time. She appears well-developed and well-nourished. No distress.  HENT:  Nose: Nose normal.  Mouth/Throat: Oropharynx is clear and moist.  Eyes: Right eye exhibits no discharge. Left eye exhibits no discharge. No scleral icterus.  Neck: Neck supple. No thyromegaly present.  Cardiovascular: Normal rate and regular rhythm.  Pulmonary/Chest: Breath sounds normal. No accessory muscle usage. No tachypnea. No respiratory distress. She has no decreased breath sounds. She has no wheezes. She has no rhonchi. Right breast exhibits no inverted nipple, no mass, no nipple discharge and no tenderness (no axillary adenopathy). Left breast exhibits no inverted nipple, no mass, no nipple discharge and no tenderness (no axilarry adenopathy).  Abdominal: Soft. Bowel sounds are normal. There is no tenderness.  Musculoskeletal: She exhibits no edema or tenderness.  Lymphadenopathy:    She has no cervical adenopathy.  Neurological: She is alert and oriented to person, place, and time.  Skin: Skin is warm. No rash noted. No erythema.  Psychiatric: She has a normal mood and affect. Her behavior is normal.    BP 112/60 (BP Location: Left Arm, Patient Position: Sitting, Cuff Size: Normal)   Pulse 70   Temp 97.6 F (36.4 C) (Oral)   Resp 15   Ht 5\' 2"  (1.575 m)   Wt 134 lb (60.8 kg)   LMP 10/21/1984   SpO2 97%   BMI 24.51 kg/m  Wt Readings from Last 3 Encounters:  01/22/18 134 lb (60.8 kg)  06/26/17 132 lb 12 oz (60.2 kg)    02/28/17 134 lb 12.8 oz (61.1 kg)     Lab Results  Component Value Date   WBC 10.2 01/24/2018   HGB 14.2 01/24/2018   HCT 41.8 01/24/2018   PLT 289.0 01/24/2018   GLUCOSE 96 01/24/2018   CHOL 156 01/24/2018   TRIG 80.0 01/24/2018   HDL 54.90 01/24/2018   LDLDIRECT 94.0 01/04/2017   LDLCALC 85 01/24/2018   ALT 15 01/24/2018   AST 16 01/24/2018   NA 132 (L) 01/24/2018   K 4.3 01/24/2018   CL 99 01/24/2018   CREATININE 0.90  01/24/2018   BUN 25 (H) 01/24/2018   CO2 27 01/24/2018   TSH 1.02 01/24/2018   INR 1.0 08/12/2015       Assessment & Plan:   Problem List Items Addressed This Visit    Esophagitis    Upper symptoms controlled on current regimen.  Follow.        Health care maintenance    Physical today 01/22/18.  Colonoscopy 09/05/12.  Mammogram 02/09/17 - Bir[facility] I.  Scheduled for f/u mammogram.        Hematuria   Relevant Orders   Urinalysis, Routine w reflex microscopic   Hypercholesterolemia    On simvastatin.  Low cholesterol diet and exercise.  Follow lipid panel and liver function tests.        Relevant Orders   Hepatic function panel   Lipid panel   Hypertension    Blood pressure under good control.  Continue same medication regimen.  Follow pressures.  Follow metabolic panel.        Relevant Orders   Basic metabolic panel   Hypothyroidism    On thyroid replacement.  Follow tsh.        Other Visit Diagnoses    Routine general medical examination at a health care facility    -  Primary   Breast cancer screening       Relevant Orders   MM SCREENING BREAST TOMO BILATERAL   Midline low back pain without sciatica, unspecified chronicity       Relevant Orders   Urinalysis, Routine w reflex microscopic (Completed)   Urine Culture (Completed)       Einar Pheasant, MD

## 2018-01-22 NOTE — Assessment & Plan Note (Signed)
Physical today 01/22/18.  Colonoscopy 09/05/12.  Mammogram 02/09/17 - Birads I.  Scheduled for f/u mammogram.

## 2018-01-24 ENCOUNTER — Other Ambulatory Visit: Payer: Self-pay | Admitting: Internal Medicine

## 2018-01-24 ENCOUNTER — Other Ambulatory Visit: Payer: Self-pay

## 2018-01-24 ENCOUNTER — Other Ambulatory Visit (INDEPENDENT_AMBULATORY_CARE_PROVIDER_SITE_OTHER): Payer: Medicare Other

## 2018-01-24 DIAGNOSIS — E039 Hypothyroidism, unspecified: Secondary | ICD-10-CM

## 2018-01-24 DIAGNOSIS — E7800 Pure hypercholesterolemia, unspecified: Secondary | ICD-10-CM | POA: Diagnosis not present

## 2018-01-24 DIAGNOSIS — I10 Essential (primary) hypertension: Secondary | ICD-10-CM

## 2018-01-24 LAB — LIPID PANEL
CHOLESTEROL: 156 mg/dL (ref 0–200)
HDL: 54.9 mg/dL (ref >39.00)
LDL Cholesterol: 85 mg/dL (ref 0–99)
NonHDL: 101.07
TRIGLYCERIDES: 80 mg/dL (ref 0.0–149.0)
Total CHOL/HDL Ratio: 3
VLDL: 16 mg/dL (ref 0.0–40.0)

## 2018-01-24 LAB — BASIC METABOLIC PANEL
BUN: 25 mg/dL — ABNORMAL HIGH (ref 6–23)
CHLORIDE: 99 meq/L (ref 96–112)
CO2: 27 meq/L (ref 19–32)
Calcium: 10.4 mg/dL (ref 8.4–10.5)
Creatinine, Ser: 0.9 mg/dL (ref 0.40–1.20)
GFR: 64.34 mL/min (ref >60.00)
Glucose, Bld: 96 mg/dL (ref 70–99)
POTASSIUM: 4.3 meq/L (ref 3.5–5.1)
Sodium: 132 mEq/L — ABNORMAL LOW (ref 135–145)

## 2018-01-24 LAB — CBC WITH DIFFERENTIAL/PLATELET
BASOS ABS: 0 10*3/uL (ref 0.0–0.1)
BASOS PCT: 0.2 % (ref 0.0–3.0)
EOS ABS: 0.1 10*3/uL (ref 0.0–0.7)
Eosinophils Relative: 0.9 % (ref 0.0–5.0)
HCT: 41.8 % (ref 36.0–46.0)
HEMOGLOBIN: 14.2 g/dL (ref 12.0–15.0)
Lymphocytes Relative: 18.1 % (ref 12.0–46.0)
Lymphs Abs: 1.8 10*3/uL (ref 0.7–4.0)
MCHC: 33.9 g/dL (ref 30.0–36.0)
MCV: 91.5 fl (ref 78.0–100.0)
MONO ABS: 0.9 10*3/uL (ref 0.1–1.0)
Monocytes Relative: 8.9 % (ref 3.0–12.0)
Neutro Abs: 7.3 10*3/uL (ref 1.4–7.7)
Neutrophils Relative %: 71.9 % (ref 43.0–77.0)
Platelets: 289 10*3/uL (ref 150.0–400.0)
RBC: 4.57 Mil/uL (ref 3.87–5.11)
RDW: 12.9 % (ref 11.5–15.5)
WBC: 10.2 10*3/uL (ref 4.0–10.5)

## 2018-01-24 LAB — URINE CULTURE
MICRO NUMBER: 90406450
SPECIMEN QUALITY:: ADEQUATE

## 2018-01-24 LAB — TSH: TSH: 1.02 u[IU]/mL (ref 0.35–4.50)

## 2018-01-24 LAB — HEPATIC FUNCTION PANEL
ALBUMIN: 4 g/dL (ref 3.5–5.2)
ALT: 15 U/L (ref 0–35)
AST: 16 U/L (ref 0–37)
Alkaline Phosphatase: 58 U/L (ref 39–117)
Bilirubin, Direct: 0.2 mg/dL (ref 0.0–0.3)
TOTAL PROTEIN: 6.7 g/dL (ref 6.0–8.3)
Total Bilirubin: 1.3 mg/dL — ABNORMAL HIGH (ref 0.2–1.2)

## 2018-01-24 MED ORDER — CIPROFLOXACIN HCL 250 MG PO TABS: 250.0000 mg | ORAL_TABLET | Freq: Two times a day (BID) | ORAL | 0 refills | Status: DC

## 2018-01-24 NOTE — Progress Notes (Signed)
Orders placed for fasting labs.

## 2018-01-25 ENCOUNTER — Other Ambulatory Visit: Payer: Self-pay | Admitting: Internal Medicine

## 2018-01-25 DIAGNOSIS — E871 Hypo-osmolality and hyponatremia: Secondary | ICD-10-CM

## 2018-01-25 NOTE — Progress Notes (Signed)
Order placed for f/u sodium.  ?

## 2018-01-28 ENCOUNTER — Encounter: Payer: Self-pay | Admitting: Internal Medicine

## 2018-01-28 NOTE — Assessment & Plan Note (Signed)
On thyroid replacement.  Follow tsh.  

## 2018-01-28 NOTE — Assessment & Plan Note (Signed)
On simvastatin.  Low cholesterol diet and exercise.  Follow lipid panel and liver function tests.   

## 2018-01-28 NOTE — Assessment & Plan Note (Signed)
Blood pressure under good control.  Continue same medication regimen.  Follow pressures.  Follow metabolic panel.   

## 2018-01-28 NOTE — Assessment & Plan Note (Signed)
Upper symptoms controlled on current regimen.  Follow.

## 2018-02-13 ENCOUNTER — Ambulatory Visit
Admission: RE | Admit: 2018-02-13 | Discharge: 2018-02-13 | Disposition: A | Discharge status: Home / Self Care | Payer: Medicare Other | Source: Ambulatory Visit | Attending: Internal Medicine | Admitting: Internal Medicine

## 2018-02-13 DIAGNOSIS — Z1231 Encounter for screening mammogram for malignant neoplasm of breast: Secondary | ICD-10-CM | POA: Insufficient documentation

## 2018-02-13 DIAGNOSIS — Z1239 Encounter for other screening for malignant neoplasm of breast: Secondary | ICD-10-CM

## 2018-02-14 ENCOUNTER — Other Ambulatory Visit (INDEPENDENT_AMBULATORY_CARE_PROVIDER_SITE_OTHER): Payer: Medicare Other

## 2018-02-14 DIAGNOSIS — I10 Essential (primary) hypertension: Secondary | ICD-10-CM

## 2018-02-14 DIAGNOSIS — E7800 Pure hypercholesterolemia, unspecified: Secondary | ICD-10-CM | POA: Diagnosis not present

## 2018-02-14 DIAGNOSIS — R319 Hematuria, unspecified: Secondary | ICD-10-CM | POA: Diagnosis not present

## 2018-02-14 DIAGNOSIS — E871 Hypo-osmolality and hyponatremia: Secondary | ICD-10-CM

## 2018-02-14 LAB — HEPATIC FUNCTION PANEL
ALBUMIN: 4 g/dL (ref 3.5–5.2)
ALT: 17 U/L (ref 0–35)
AST: 18 U/L (ref 0–37)
Alkaline Phosphatase: 46 U/L (ref 39–117)
Bilirubin, Direct: 0.2 mg/dL (ref 0.0–0.3)
TOTAL PROTEIN: 6.2 g/dL (ref 6.0–8.3)
Total Bilirubin: 1.1 mg/dL (ref 0.2–1.2)

## 2018-02-14 LAB — URINALYSIS, ROUTINE W REFLEX MICROSCOPIC
BILIRUBIN URINE: NEGATIVE
Ketones, ur: NEGATIVE
LEUKOCYTES UA: NEGATIVE
NITRITE: NEGATIVE
PH: 5.5 (ref 5.0–8.0)
Specific Gravity, Urine: 1.02 (ref 1.000–1.030)
Total Protein, Urine: NEGATIVE
Urine Glucose: NEGATIVE
Urobilinogen, UA: 0.2 (ref 0.0–1.0)
WBC, UA: NONE SEEN (ref 0–?)

## 2018-02-14 LAB — LIPID PANEL
Cholesterol: 145 mg/dL (ref 0–200)
HDL: 51.2 mg/dL (ref >39.00)
LDL CALC: 62 mg/dL (ref 0–99)
NonHDL: 93.34
TRIGLYCERIDES: 155 mg/dL — AB (ref 0.0–149.0)
Total CHOL/HDL Ratio: 3
VLDL: 31 mg/dL (ref 0.0–40.0)

## 2018-02-14 LAB — BASIC METABOLIC PANEL
BUN: 17 mg/dL (ref 6–23)
CHLORIDE: 99 meq/L (ref 96–112)
CO2: 25 meq/L (ref 19–32)
Calcium: 9.7 mg/dL (ref 8.4–10.5)
Creatinine, Ser: 0.86 mg/dL (ref 0.40–1.20)
GFR: 67.8 mL/min (ref >60.00)
Glucose, Bld: 99 mg/dL (ref 70–99)
Potassium: 4.6 mEq/L (ref 3.5–5.1)
SODIUM: 128 meq/L — AB (ref 135–145)

## 2018-02-14 NOTE — Addendum Note (Signed)
Addended by: Arby Barrette on: 02/14/2018 11:12 AM   Modules accepted: Orders

## 2018-02-14 NOTE — Addendum Note (Signed)
Addended by: Arby Barrette on: 02/14/2018 02:17 PM   Modules accepted: Orders

## 2018-02-15 ENCOUNTER — Other Ambulatory Visit (INDEPENDENT_AMBULATORY_CARE_PROVIDER_SITE_OTHER): Payer: Medicare Other

## 2018-02-15 ENCOUNTER — Other Ambulatory Visit: Payer: Self-pay | Admitting: Internal Medicine

## 2018-02-15 DIAGNOSIS — E871 Hypo-osmolality and hyponatremia: Secondary | ICD-10-CM

## 2018-02-15 LAB — SODIUM: Sodium: 133 mEq/L — ABNORMAL LOW (ref 135–145)

## 2018-02-15 NOTE — Progress Notes (Signed)
F/u sodium lab ordered.

## 2018-02-15 NOTE — Progress Notes (Signed)
Order placed for f/u sodium.  ?

## 2018-03-01 ENCOUNTER — Ambulatory Visit (INDEPENDENT_AMBULATORY_CARE_PROVIDER_SITE_OTHER): Payer: Medicare Other

## 2018-03-01 VITALS — BP 112/70 | HR 60 | Temp 97.8°F | Resp 14 | Ht 62.0 in | Wt 128.8 lb

## 2018-03-01 DIAGNOSIS — Z0000 Encounter for general adult medical examination without abnormal findings: Secondary | ICD-10-CM

## 2018-03-01 NOTE — Progress Notes (Addendum)
Subjective:   Crystal Lynch is a 77 y.o. female who presents for Medicare Annual (Subsequent) preventive examination.  Review of Systems:   No ROS.  Medicare Wellness Visit. Additional risk factors are reflected in the social history. . Cardiac Risk Factors include: advanced age (>38men, >16 women);hypertension     Objective:     Vitals: BP 112/70 (BP Location: Left Arm, Patient Position: Sitting, Cuff Size: Normal)   Pulse 60   Temp 97.8 F (36.6 C) (Oral)   Resp 14   Ht 5\' 2"  (1.575 m)   Wt 128 lb 12.8 oz (58.4 kg)   LMP 10/21/1984   SpO2 96%   BMI 23.56 kg/m   Body mass index is 23.56 kg/m.  Advanced Directives 03/01/2018 09/04/2017 02/28/2017 08/18/2015  Does Patient Have a Medical Advance Directive? Yes Yes Yes Yes  Type of Paramedic of [place];Living will [place];Living will [place];Living will [place];Living will  Does patient want to make changes to medical advance directive? No - Patient declined No - Patient declined No - Patient declined No - Patient declined  Copy of [place] in Chart? No - copy requested - No - copy requested No - copy requested    Tobacco Social History   Tobacco Use  Smoking Status Never Smoker  Smokeless Tobacco Never Used     Counseling given: Not Answered   Clinical Intake:  Pre-visit preparation completed: Yes  Pain : No/denies pain     Nutritional Status: BMI of 19-24  Normal Diabetes: No  How often do you need to have someone help you when you read instructions, pamphlets, or other written materials from your doctor or pharmacy?: 1 - Never  Interpreter Needed?: No     Past Medical History:  Diagnosis Date  . Arthritis   . Chicken pox   . Endometriosis   . Esophagitis    s/p esophageal dilatation  . GERD (gastroesophageal reflux disease)   . History of ovarian cyst   . Hypercholesterolemia   .  Hypertension   . Hypothyroidism    Past Surgical History:  Procedure Laterality Date  . ABDOMINAL SURGERY     found to have an ovarian cyst and endometriosis  . APPENDECTOMY     Family History  Problem Relation Age of Onset  . Lung disease Father   . Heart attack Father   . Heart disease Mother        myocardial infarction  . Breast cancer Mother 27  . Heart attack Mother   . Hypertension Mother   . Hyperlipidemia Mother   . Breast cancer Sister 56  . Hypertension Sister   . Breast cancer Maternal Aunt 13  . Hypertension Brother   . Colon cancer Neg Hx    Social History   Socioeconomic History  . Marital status: Married    Spouse name: Not on file  . Number of children: Not on file  . Years of education: Not on file  . Highest education level: Not on file  Occupational History  . Not on file  Social Needs  . Financial resource strain: Not hard at all  . Food insecurity:    Worry: Never true    Inability: Never true  . Transportation needs:    Medical: No    Non-medical: No  Tobacco Use  . Smoking status: Never Smoker  . Smokeless tobacco: Never Used  Substance and Sexual Activity  .  Alcohol use: No    Alcohol/week: 0.0 oz  . Drug use: No  . Sexual activity: Never  Lifestyle  . Physical activity:    Days per week: Not on file    Minutes per session: Not on file  . Stress: Not at all  Relationships  . Social connections:    Talks on phone: Not on file    Gets together: Not on file    Attends religious service: Not on file    Active member of club or organization: Not on file    Attends meetings of clubs or organizations: Not on file    Relationship status: Not on file  Other Topics Concern  . Not on file  Social History Narrative  . Not on file    Outpatient Encounter Medications as of 03/01/2018  Medication Sig  . aspirin 81 MG tablet Take 81 mg by mouth daily.  . Calcium Carbonate-Vitamin D (CALTRATE 600+D) 600-400 MG-UNIT per tablet Take 1  tablet by mouth 2 (two) times daily.  . carvedilol (COREG) 6.25 MG tablet Take 1 tablet (6.25 mg total) by mouth 2 (two) times daily.  . ciprofloxacin (CIPRO) 250 MG tablet Take 1 tablet (250 mg total) by mouth 2 (two) times daily.  Marland Kitchen levothyroxine (SYNTHROID, LEVOTHROID) 88 MCG tablet TAKE 1 TABLET DAILY  . lisinopril (PRINIVIL,ZESTRIL) 40 MG tablet Take 1 tablet (40 mg total) by mouth daily.  Marland Kitchen omeprazole (PRILOSEC) 20 MG capsule TAKE 1 CAPSULE DAILY  . simvastatin (ZOCOR) 10 MG tablet TAKE 1 TABLET AT BEDTIME  . spironolactone (ALDACTONE) 25 MG tablet Take 1 tablet (25 mg total) by mouth daily.  Marland Kitchen amLODipine (NORVASC) 5 MG tablet Take 1 tablet (5 mg total) by mouth daily.   No facility-administered encounter medications on file as of 03/01/2018.     Activities of Daily Living In your present state of health, do you have any difficulty performing the following activities: 03/01/2018  Hearing? N  Vision? N  Difficulty concentrating or making decisions? N  Walking or climbing stairs? N  Dressing or bathing? N  Doing errands, shopping? N  Preparing Food and eating ? N  Using the Toilet? N  In the past six months, have you accidently leaked urine? N  Do you have problems with loss of bowel control? N  Managing your Medications? N  Managing your Finances? N  Housekeeping or managing your Housekeeping? N  Some recent data might be hidden    Patient Care Team: Einar Pheasant, MD as PCP - General (Internal Medicine)    Assessment:   This is a routine wellness examination for Crystal Lynch. The goal of the wellness visit is to assist the patient how to close the gaps in care and create a preventative care plan for the patient.   The roster of all physicians providing medical care to patient is listed in the Snapshot section of the chart.  Taking calcium VIT D as appropriate/Osteoporosis risk reviewed.    Safety issues reviewed; Smoke and carbon monoxide detectors in the home. No firearms  in the home. Wears seatbelts when driving or riding with others. No violence in the home.  They do not have excessive sun exposure.  Discussed the need for sun protection: hats, long sleeves and the use of sunscreen if there is significant sun exposure.  Patient is alert, normal appearance, oriented to person/place/and time.  Correctly identified the president of the [place] and recalls of 3/3 words. Performs simple calculations and can read correct time from  watch face. Displays appropriate judgement.  No new identified risk were noted.  No failures at ADL's or IADL's.   BMI- discussed the importance of a healthy diet, water intake and the benefits of aerobic exercise. Educational material provided.   24 hour diet recall: Regular diet  Dental- every 6 months.  Eye- Visual acuity not assessed per patient preference since they have regular follow up with the ophthalmologist.  Wears corrective lenses.  Sleep patterns- Sleeps 6-7 hours at night.  Wakes feeling rested.  TDAP vaccine deferred per patient preference.  Follow up with insurance.  Educational material provided.  Patient Concerns: None at this time. Follow up with PCP as needed.  Exercise Activities and Dietary recommendations Current Exercise Habits: Home exercise routine, Type of exercise: stretching;walking, Time (Minutes): 60, Frequency (Times/Week): 3, Weekly Exercise (Minutes/Week): 180, Intensity: Mild  Goals    . Maintain Healthy  Lifestyle       Fall Risk Fall Risk  03/01/2018 02/28/2017 07/12/2016 01/10/2016 11/09/2014  Falls in the past year? No No No No No   Depression Screen PHQ 2/9 Scores 03/01/2018 02/28/2017 07/12/2016 01/10/2016  PHQ - 2 Score 0 0 0 0  PHQ- 9 Score - 0 - -     Cognitive Function MMSE - Mini Mental State Exam 03/01/2018 02/28/2017  Orientation to time 5 5  Orientation to Place 5 5  Registration 3 3  Attention/ Calculation 5 5  Recall 3 3  Language- name 2 objects 2 2  Language- repeat 1 1    Language- follow 3 step command 3 3  Language- read & follow direction 1 1  Write a sentence 1 1  Copy design 1 1  Total score 30 30        Immunization History  Administered Date(s) Administered  . Influenza-Unspecified 06/23/2013, 06/29/2014, 07/12/2015, 07/04/2016  . Pneumococcal Conjugate-13 08/04/2014  . Pneumococcal Polysaccharide-23 02/28/2017   Screening Tests Health Maintenance  Topic Date Due  . TETANUS/TDAP  12/10/1958  . INFLUENZA VACCINE  05/23/2018  . MAMMOGRAM  02/14/2019  . DEXA SCAN  Completed  . PNA vac Low Risk Adult  Completed      Plan:    End of life planning; Advance aging; Advanced directives discussed. Copy of current HCPOA/Living Will requested.    I have personally reviewed and noted the following in the patient's chart:   . Medical and social history . Use of alcohol, tobacco or illicit drugs  . Current medications and supplements . Functional ability and status . Nutritional status . Physical activity . Advanced directives . List of other physicians . Hospitalizations, surgeries, and ER visits in previous 12 months . Vitals . Screenings to include cognitive, depression, and falls . Referrals and appointments  In addition, I have reviewed and discussed with patient certain preventive protocols, quality metrics, and best practice recommendations. A written personalized care plan for preventive services as well as general preventive health recommendations were provided to patient.     Varney Biles, LPN  [id number]   Reviewed above information.  Agree with assessment and plan.    Dr Nicki Reaper

## 2018-03-01 NOTE — Patient Instructions (Addendum)
  Crystal Lynch , Thank you for taking time to come for your Medicare Wellness Visit. I appreciate your ongoing commitment to your health goals. Please review the following plan we discussed and let me know if I can assist you in the future.   Follow up as needed.    Bring a copy of your [place] and/or Living Will to be scanned into chart.  Have a great day!  These are the goals we discussed: Goals    . Maintain Healthy  Lifestyle       This is a list of the screening recommended for you and due dates:  Health Maintenance  Topic Date Due  . Tetanus Vaccine  12/10/1958  . Flu Shot  05/23/2018  . Mammogram  02/14/2019  . DEXA scan (bone density measurement)  Completed  . Pneumonia vaccines  Completed

## 2018-03-03 ENCOUNTER — Other Ambulatory Visit: Payer: Self-pay | Admitting: Internal Medicine

## 2018-03-06 ENCOUNTER — Other Ambulatory Visit (INDEPENDENT_AMBULATORY_CARE_PROVIDER_SITE_OTHER): Payer: Medicare Other

## 2018-03-06 DIAGNOSIS — E871 Hypo-osmolality and hyponatremia: Secondary | ICD-10-CM | POA: Diagnosis not present

## 2018-03-06 LAB — SODIUM: Sodium: 132 mEq/L — ABNORMAL LOW (ref 135–145)

## 2018-03-07 ENCOUNTER — Other Ambulatory Visit: Payer: Self-pay | Admitting: Internal Medicine

## 2018-03-07 ENCOUNTER — Encounter: Payer: Self-pay | Admitting: Internal Medicine

## 2018-03-07 DIAGNOSIS — E871 Hypo-osmolality and hyponatremia: Secondary | ICD-10-CM

## 2018-03-07 NOTE — Progress Notes (Signed)
Order placed for f/u sodium.  ?

## 2018-03-07 NOTE — Telephone Encounter (Signed)
Called patient to give lab results and scheduled her for a f/u lab appt

## 2018-03-11 ENCOUNTER — Other Ambulatory Visit: Payer: Self-pay | Admitting: Internal Medicine

## 2018-03-25 ENCOUNTER — Other Ambulatory Visit: Payer: Self-pay

## 2018-03-25 ENCOUNTER — Telehealth: Payer: Self-pay | Admitting: Cardiovascular Disease

## 2018-03-25 MED ORDER — AMLODIPINE BESYLATE 5 MG PO TABS: 5.0000 mg | ORAL_TABLET | Freq: Every day | ORAL | 0 refills | Status: DC

## 2018-03-25 NOTE — Telephone Encounter (Signed)
amLODipine (NORVASC) 5 MG tablet 90 tablet 0 03/25/2018 06/23/2018   Sig - Route: Take 1 tablet (5 mg total) by mouth daily. - Oral   Sent to pharmacy as: amLODipine (NORVASC) 5 MG tablet   E-Prescribing Status: Receipt confirmed by pharmacy (03/25/2018 4:43 PM EDT)   Pharmacy   [place], [place]

## 2018-03-25 NOTE — Telephone Encounter (Signed)
°*  STAT* If patient is at the pharmacy, call can be transferred to refill team.   1. Which medications need to be refilled? (please list name of each medication and dose if known) amLODipine 5 MG  2. Which pharmacy/location (including street and city if local pharmacy) is medication to be sent to? [facility]   3. Do they need a 30 day or 90 day supply? 90 day

## 2018-04-04 ENCOUNTER — Other Ambulatory Visit (INDEPENDENT_AMBULATORY_CARE_PROVIDER_SITE_OTHER): Payer: Medicare Other

## 2018-04-04 DIAGNOSIS — E871 Hypo-osmolality and hyponatremia: Secondary | ICD-10-CM | POA: Diagnosis not present

## 2018-04-04 LAB — SODIUM: SODIUM: 132 meq/L — AB (ref 135–145)

## 2018-04-17 ENCOUNTER — Other Ambulatory Visit: Payer: Self-pay | Admitting: Rheumatology

## 2018-04-17 DIAGNOSIS — M545 Low back pain, unspecified: Secondary | ICD-10-CM

## 2018-04-17 DIAGNOSIS — G8929 Other chronic pain: Secondary | ICD-10-CM

## 2018-04-19 ENCOUNTER — Ambulatory Visit (INDEPENDENT_AMBULATORY_CARE_PROVIDER_SITE_OTHER): Payer: Medicare Other | Admitting: Internal Medicine

## 2018-04-19 ENCOUNTER — Encounter: Payer: Self-pay | Admitting: Internal Medicine

## 2018-04-19 VITALS — BP 112/62 | HR 69 | Temp 97.6°F | Ht 62.0 in | Wt 129.8 lb

## 2018-04-19 DIAGNOSIS — E871 Hypo-osmolality and hyponatremia: Secondary | ICD-10-CM | POA: Diagnosis not present

## 2018-04-19 DIAGNOSIS — R1031 Right lower quadrant pain: Secondary | ICD-10-CM | POA: Diagnosis not present

## 2018-04-19 DIAGNOSIS — N3000 Acute cystitis without hematuria: Secondary | ICD-10-CM | POA: Diagnosis not present

## 2018-04-19 LAB — CBC WITH DIFFERENTIAL/PLATELET
BASOS ABS: 0 10*3/uL (ref 0.0–0.1)
Basophils Relative: 0.6 % (ref 0.0–3.0)
Eosinophils Absolute: 0.2 10*3/uL (ref 0.0–0.7)
Eosinophils Relative: 2.9 % (ref 0.0–5.0)
HCT: 40.1 % (ref 36.0–46.0)
HEMOGLOBIN: 13.7 g/dL (ref 12.0–15.0)
LYMPHS ABS: 1.9 10*3/uL (ref 0.7–4.0)
Lymphocytes Relative: 28.8 % (ref 12.0–46.0)
MCHC: 34 g/dL (ref 30.0–36.0)
MCV: 92.7 fl (ref 78.0–100.0)
MONO ABS: 0.6 10*3/uL (ref 0.1–1.0)
Monocytes Relative: 9.6 % (ref 3.0–12.0)
NEUTROS PCT: 58.1 % (ref 43.0–77.0)
Neutro Abs: 3.8 10*3/uL (ref 1.4–7.7)
Platelets: 280 10*3/uL (ref 150.0–400.0)
RBC: 4.33 Mil/uL (ref 3.87–5.11)
RDW: 12.6 % (ref 11.5–15.5)
WBC: 6.5 10*3/uL (ref 4.0–10.5)

## 2018-04-19 LAB — URINALYSIS, ROUTINE W REFLEX MICROSCOPIC
BILIRUBIN URINE: NEGATIVE
KETONES UR: NEGATIVE
LEUKOCYTES UA: NEGATIVE
NITRITE: NEGATIVE
SPECIFIC GRAVITY, URINE: 1.015 (ref 1.000–1.030)
Total Protein, Urine: NEGATIVE
URINE GLUCOSE: NEGATIVE
UROBILINOGEN UA: 0.2 (ref 0.0–1.0)
WBC UA: NONE SEEN (ref 0–?)
pH: 6 (ref 5.0–8.0)

## 2018-04-19 LAB — BASIC METABOLIC PANEL
BUN: 20 mg/dL (ref 6–23)
CALCIUM: 9.9 mg/dL (ref 8.4–10.5)
CO2: 26 mEq/L (ref 19–32)
Chloride: 100 mEq/L (ref 96–112)
Creatinine, Ser: 0.93 mg/dL (ref 0.40–1.20)
GFR: 61.91 mL/min (ref >60.00)
GLUCOSE: 94 mg/dL (ref 70–99)
Potassium: 5.4 mEq/L — ABNORMAL HIGH (ref 3.5–5.1)
SODIUM: 132 meq/L — AB (ref 135–145)

## 2018-04-19 MED ORDER — CIPROFLOXACIN HCL 250 MG PO TABS: 250.0000 mg | ORAL_TABLET | Freq: Two times a day (BID) | ORAL | 0 refills | Status: DC

## 2018-04-19 NOTE — Progress Notes (Signed)
Chief Complaint  Patient presents with  . Flank Pain  . Back Pain   Acute visit  1. C/o RLQ ab pain max 8/10 sitting makes worse tried Advil 2-3 x per day for arthritis ? If helps ab pain. Pain is throbbing/pulsating radiating to RLB and at times pounding. Pain is intermittent x 1 week. She does have h/o UTI E coli 01/2018 does not currently have UTI sx's I.e burning, blood in stool. No h/o kidney stones    Review of Systems  Constitutional: Negative for fever.  Cardiovascular: Negative for chest pain.  Gastrointestinal: Positive for abdominal pain.  Genitourinary: Negative for dysuria.  Musculoskeletal: Positive for back pain.   Past Medical History:  Diagnosis Date  . Arthritis   . Chicken pox   . Endometriosis   . Esophagitis    s/p esophageal dilatation  . GERD (gastroesophageal reflux disease)   . History of ovarian cyst   . Hypercholesterolemia   . Hypertension   . Hypothyroidism    Past Surgical History:  Procedure Laterality Date  . ABDOMINAL SURGERY     found to have an ovarian cyst and endometriosis  . APPENDECTOMY     Family History  Problem Relation Age of Onset  . Lung disease Father   . Heart attack Father   . Heart disease Mother        myocardial infarction  . Breast cancer Mother 10  . Heart attack Mother   . Hypertension Mother   . Hyperlipidemia Mother   . Breast cancer Sister 72  . Hypertension Sister   . Breast cancer Maternal Aunt 11  . Hypertension Brother   . Colon cancer Neg Hx    Social History   Socioeconomic History  . Marital status: Married    Spouse name: Not on file  . Number of children: Not on file  . Years of education: Not on file  . Highest education level: Not on file  Occupational History  . Not on file  Social Needs  . Financial resource strain: Not hard at all  . Food insecurity:    Worry: Never true    Inability: Never true  . Transportation needs:    Medical: No    Non-medical: No  Tobacco Use  . Smoking  status: Never Smoker  . Smokeless tobacco: Never Used  Substance and Sexual Activity  . Alcohol use: No    Alcohol/week: 0.0 oz  . Drug use: No  . Sexual activity: Never  Lifestyle  . Physical activity:    Days per week: Not on file    Minutes per session: Not on file  . Stress: Not at all  Relationships  . Social connections:    Talks on phone: Not on file    Gets together: Not on file    Attends religious service: Not on file    Active member of club or organization: Not on file    Attends meetings of clubs or organizations: Not on file    Relationship status: Not on file  . Intimate partner violence:    Fear of current or ex partner: No    Emotionally abused: No    Physically abused: No    Forced sexual activity: No  Other Topics Concern  . Not on file  Social History Narrative  . Not on file   Current Meds  Medication Sig  . amLODipine (NORVASC) 5 MG tablet Take 1 tablet (5 mg total) by mouth daily.  Marland Kitchen aspirin 81 MG  tablet Take 81 mg by mouth daily.  . Calcium Carbonate-Vitamin D (CALTRATE 600+D) 600-400 MG-UNIT per tablet Take 1 tablet by mouth 2 (two) times daily.  . carvedilol (COREG) 6.25 MG tablet Take 1 tablet (6.25 mg total) by mouth 2 (two) times daily.  Marland Kitchen levothyroxine (SYNTHROID, LEVOTHROID) 88 MCG tablet TAKE 1 TABLET DAILY  . lisinopril (PRINIVIL,ZESTRIL) 40 MG tablet Take 1 tablet (40 mg total) by mouth daily.  Marland Kitchen omeprazole (PRILOSEC) 20 MG capsule TAKE 1 CAPSULE DAILY  . simvastatin (ZOCOR) 10 MG tablet TAKE 1 TABLET AT BEDTIME  . spironolactone (ALDACTONE) 25 MG tablet Take 1 tablet (25 mg total) by mouth daily.   Allergies  Allergen Reactions  . Meloxicam     BP issues   Recent Results (from the past 2160 hour(s))  Urinalysis, Routine w reflex microscopic     Status: Abnormal   Collection Time: 01/22/18  2:13 PM  Result Value Ref Range   Color, Urine YELLOW Yellow;Lt. Yellow   APPearance Sl Cloudy (A) Clear   Specific Gravity, Urine 1.015 1.000  - 1.030   pH 5.5 5.0 - 8.0   Total Protein, Urine NEGATIVE Negative   Urine Glucose NEGATIVE Negative   Ketones, ur NEGATIVE Negative   Bilirubin Urine NEGATIVE Negative   Hgb urine dipstick MODERATE (A) Negative   Urobilinogen, UA 0.2 0.0 - 1.0   Leukocytes, UA MODERATE (A) Negative   Nitrite NEGATIVE Negative   WBC, UA 11-20/hpf (A) 0-2/hpf   RBC / HPF 0-2/hpf 0-2/hpf   Bacteria, UA Many(>50/hpf) (A) None  Urine Culture     Status: Abnormal   Collection Time: 01/22/18  2:13 PM  Result Value Ref Range   MICRO NUMBER: [id number]    SPECIMEN QUALITY: ADEQUATE    Sample Source URINE    STATUS: FINAL    ISOLATE 1: Escherichia coli (A)     Comment: Greater than 100,000 CFU/mL of Escherichia coli      Susceptibility   Escherichia coli - URINE CULTURE, REFLEX    AMOX/CLAVULANIC >=32 Resistant     AMPICILLIN >=32 Resistant     AMPICILLIN/SULBACTAM >=32 Resistant     CEFAZOLIN* 32 Resistant      * For uncomplicated UTI caused by E. coli,K. pneumoniae or P. mirabilis: Cefazolin issusceptible if MIC <32 mcg/mL and predictssusceptible to the oral agents cefaclor, cefdinir,cefpodoxime, cefprozil, cefuroxime, cephalexinand loracarbef.    CEFEPIME <=1 Sensitive     CEFTRIAXONE <=1 Sensitive     CIPROFLOXACIN <=0.25 Sensitive     LEVOFLOXACIN <=0.12 Sensitive     ERTAPENEM <=0.5 Sensitive     GENTAMICIN <=1 Sensitive     IMIPENEM <=0.25 Sensitive     NITROFURANTOIN <=16 Sensitive     PIP/TAZO <=4 Sensitive     TOBRAMYCIN <=1 Sensitive     TRIMETH/SULFA* <=20 Sensitive      * For uncomplicated UTI caused by E. coli,K. pneumoniae or P. mirabilis: Cefazolin issusceptible if MIC <32 mcg/mL and predictssusceptible to the oral agents cefaclor, cefdinir,cefpodoxime, cefprozil, cefuroxime, cephalexinand loracarbef.Legend:S = Susceptible  I = IntermediateR = Resistant  NS = Not susceptible* = Not tested  NR = Not reported**NN = See antimicrobic comments  Basic metabolic panel     Status: Abnormal    Collection Time: 01/24/18  8:23 AM  Result Value Ref Range   Sodium 132 (L) 135 - 145 mEq/L   Potassium 4.3 3.5 - 5.1 mEq/L   Chloride 99 96 - 112 mEq/L   CO2 27 19 - 32 mEq/L  Glucose, Bld 96 70 - 99 mg/dL   BUN 25 (H) 6 - 23 mg/dL   Creatinine, Ser 0.90 0.40 - 1.20 mg/dL   Calcium 10.4 8.4 - 10.5 mg/dL   GFR 64.34 >60.00 mL/min  TSH     Status: None   Collection Time: 01/24/18  8:23 AM  Result Value Ref Range   TSH 1.02 0.35 - 4.50 uIU/mL  Lipid panel     Status: None   Collection Time: 01/24/18  8:23 AM  Result Value Ref Range   Cholesterol 156 0 - 200 mg/dL    Comment: ATP III Classification       Desirable:  < 200 mg/dL               Borderline High:  200 - 239 mg/dL          High:  > = 240 mg/dL   Triglycerides 80.0 0.0 - 149.0 mg/dL    Comment: Normal:  <150 mg/dLBorderline High:  150 - 199 mg/dL   HDL 54.90 >39.00 mg/dL   VLDL 16.0 0.0 - 40.0 mg/dL   LDL Cholesterol 85 0 - 99 mg/dL   Total CHOL/HDL Ratio 3     Comment:                Men          Women1/2 Average Risk     3.4          3.3Average Risk          5.0          4.42X Average Risk          9.6          7.13X Average Risk          15.0          11.0                       NonHDL 101.07     Comment: NOTE:  Non-HDL goal should be 30 mg/dL higher than patient's LDL goal (i.e. LDL goal of < 70 mg/dL, would have non-HDL goal of < 100 mg/dL)  Hepatic function panel     Status: Abnormal   Collection Time: 01/24/18  8:23 AM  Result Value Ref Range   Total Bilirubin 1.3 (H) 0.2 - 1.2 mg/dL   Bilirubin, Direct 0.2 0.0 - 0.3 mg/dL   Alkaline Phosphatase 58 39 - 117 U/L   AST 16 0 - 37 U/L   ALT 15 0 - 35 U/L   Total Protein 6.7 6.0 - 8.3 g/dL   Albumin 4.0 3.5 - 5.2 g/dL  CBC with Differential/Platelet     Status: None   Collection Time: 01/24/18  8:23 AM  Result Value Ref Range   WBC 10.2 4.0 - 10.5 K/uL   RBC 4.57 3.87 - 5.11 Mil/uL   Hemoglobin 14.2 12.0 - 15.0 g/dL   HCT 41.8 36.0 - 46.0 %   MCV 91.5 78.0 -  100.0 fl   MCHC 33.9 30.0 - 36.0 g/dL   RDW 12.9 11.5 - 15.5 %   Platelets 289.0 150.0 - 400.0 K/uL   Neutrophils Relative % 71.9 43.0 - 77.0 %   Lymphocytes Relative 18.1 12.0 - 46.0 %   Monocytes Relative 8.9 3.0 - 12.0 %   Eosinophils Relative 0.9 0.0 - 5.0 %   Basophils Relative 0.2 0.0 - 3.0 %   Neutro Abs 7.3 1.4 - 7.7 K/uL  Lymphs Abs 1.8 0.7 - 4.0 K/uL   Monocytes Absolute 0.9 0.1 - 1.0 K/uL   Eosinophils Absolute 0.1 0.0 - 0.7 K/uL   Basophils Absolute 0.0 0.0 - 0.1 K/uL  Basic metabolic panel     Status: Abnormal   Collection Time: 02/14/18 11:03 AM  Result Value Ref Range   Sodium 128 (L) 135 - 145 mEq/L   Potassium 4.6 3.5 - 5.1 mEq/L   Chloride 99 96 - 112 mEq/L   CO2 25 19 - 32 mEq/L   Glucose, Bld 99 70 - 99 mg/dL   BUN 17 6 - 23 mg/dL   Creatinine, Ser 0.86 0.40 - 1.20 mg/dL   Calcium 9.7 8.4 - 10.5 mg/dL   GFR 67.80 >60.00 mL/min  Lipid panel     Status: Abnormal   Collection Time: 02/14/18 11:03 AM  Result Value Ref Range   Cholesterol 145 0 - 200 mg/dL    Comment: ATP III Classification       Desirable:  < 200 mg/dL               Borderline High:  200 - 239 mg/dL          High:  > = 240 mg/dL   Triglycerides 155.0 (H) 0.0 - 149.0 mg/dL    Comment: Normal:  <150 mg/dLBorderline High:  150 - 199 mg/dL   HDL 51.20 >39.00 mg/dL   VLDL 31.0 0.0 - 40.0 mg/dL   LDL Cholesterol 62 0 - 99 mg/dL   Total CHOL/HDL Ratio 3     Comment:                Men          Women1/2 Average Risk     3.4          3.3Average Risk          5.0          4.42X Average Risk          9.6          7.13X Average Risk          15.0          11.0                       NonHDL 93.34     Comment: NOTE:  Non-HDL goal should be 30 mg/dL higher than patient's LDL goal (i.e. LDL goal of < 70 mg/dL, would have non-HDL goal of < 100 mg/dL)  Hepatic function panel     Status: None   Collection Time: 02/14/18 11:03 AM  Result Value Ref Range   Total Bilirubin 1.1 0.2 - 1.2 mg/dL   Bilirubin, Direct  0.2 0.0 - 0.3 mg/dL   Alkaline Phosphatase 46 39 - 117 U/L   AST 18 0 - 37 U/L   ALT 17 0 - 35 U/L   Total Protein 6.2 6.0 - 8.3 g/dL   Albumin 4.0 3.5 - 5.2 g/dL  Urinalysis, Routine w reflex microscopic     Status: Abnormal   Collection Time: 02/14/18  2:17 PM  Result Value Ref Range   Color, Urine YELLOW Yellow;Lt. Yellow   APPearance CLEAR Clear   Specific Gravity, Urine 1.020 1.000 - 1.030   pH 5.5 5.0 - 8.0   Total Protein, Urine NEGATIVE Negative   Urine Glucose NEGATIVE Negative   Ketones, ur NEGATIVE Negative   Bilirubin Urine NEGATIVE Negative   Hgb urine dipstick  SMALL (A) Negative   Urobilinogen, UA 0.2 0.0 - 1.0   Leukocytes, UA NEGATIVE Negative   Nitrite NEGATIVE Negative   WBC, UA none seen 0-2/hpf   RBC / HPF 0-2/hpf 0-2/hpf   Squamous Epithelial / LPF Rare(0-4/hpf) Rare(0-4/hpf)  Sodium     Status: Abnormal   Collection Time: 02/15/18  8:21 AM  Result Value Ref Range   Sodium 133 (L) 135 - 145 mEq/L  Sodium     Status: Abnormal   Collection Time: 03/06/18  3:00 PM  Result Value Ref Range   Sodium 132 (L) 135 - 145 mEq/L  Sodium     Status: Abnormal   Collection Time: 04/04/18 11:10 AM  Result Value Ref Range   Sodium 132 (L) 135 - 145 mEq/L   Objective  Body mass index is 23.74 kg/m. Wt Readings from Last 3 Encounters:  04/19/18 129 lb 12.8 oz (58.9 kg)  03/01/18 128 lb 12.8 oz (58.4 kg)  01/22/18 134 lb (60.8 kg)   Temp Readings from Last 3 Encounters:  04/19/18 97.6 F (36.4 C) (Oral)  03/01/18 97.8 F (36.6 C) (Oral)  01/22/18 97.6 F (36.4 C) (Oral)   BP Readings from Last 3 Encounters:  04/19/18 112/62  03/01/18 112/70  01/22/18 112/60   Pulse Readings from Last 3 Encounters:  04/19/18 69  03/01/18 60  01/22/18 70    Physical Exam  Constitutional: She is oriented to person, place, and time. Vital signs are normal. She appears well-developed and well-nourished. She is cooperative.  HENT:  Head: Normocephalic and atraumatic.   Mouth/Throat: Uvula is midline and oropharynx is clear and moist.  Eyes: Pupils are equal, round, and reactive to light. Conjunctivae are normal.  Cardiovascular: Normal rate, regular rhythm and normal heart sounds.  Pulmonary/Chest: Effort normal and breath sounds normal.  Abdominal: Soft. Normal appearance and bowel sounds are normal. She exhibits no distension. There is tenderness in the right lower quadrant.  Mild RLQ ttp   Neurological: She is alert and oriented to person, place, and time. Gait normal.  Skin: Skin is warm, dry and intact.  Psychiatric: She has a normal mood and affect. Her speech is normal and behavior is normal. Judgment and thought content normal. Cognition and memory are normal.  Nursing note and vitals reviewed.   Assessment   1. RLQ ab pain new x 1-2 weeks ddx UTI, kidney stone prior imaging with hydronephrossis vs other  2. Hyponatremia  Plan   1. Check BMET, CBC today UA and culture CT ab/pelvis with contrast  F/u with PCP in 1-2 weeks  Prn Tylenol and NSAID 2. Check BMET today   Provider: Dr. Olivia Mackie McLean-Scocuzza-Internal Medicine

## 2018-04-19 NOTE — Progress Notes (Signed)
Pre visit review using our clinic review tool, if applicable. No additional management support is needed unless otherwise documented below in the visit note. 

## 2018-04-19 NOTE — Patient Instructions (Addendum)
Tylenol 500 mg up to 6 pill in 1 day  We will be in touch on Monday  CT ab/pelvis [facility] next week    Abdominal Pain, Adult Abdominal pain can be caused by many things. Often, abdominal pain is not serious and it gets better with no treatment or by being treated at home. However, sometimes abdominal pain is serious. Your health care provider will do a medical history and a physical exam to try to determine the cause of your abdominal pain. Follow these instructions at home:  Take over-the-counter and prescription medicines only as told by your health care provider. Do not take a laxative unless told by your health care provider.  Drink enough fluid to keep your urine clear or pale yellow.  Watch your condition for any changes.  Keep all follow-up visits as told by your health care provider. This is important. Contact a health care provider if:  Your abdominal pain changes or gets worse.  You are not hungry or you lose weight without trying.  You are constipated or have diarrhea for more than 2-3 days.  You have pain when you urinate or have a bowel movement.  Your abdominal pain wakes you up at night.  Your pain gets worse with meals, after eating, or with certain foods.  You are throwing up and cannot keep anything down.  You have a fever. Get help right away if:  Your pain does not go away as soon as your health care provider told you to expect.  You cannot stop throwing up.  Your pain is only in areas of the abdomen, such as the right side or the left lower portion of the abdomen.  You have bloody or black stools, or stools that look like tar.  You have severe pain, cramping, or bloating in your abdomen.  You have signs of dehydration, such as: ? Dark urine, very little urine, or no urine. ? Cracked lips. ? Dry mouth. ? Sunken eyes. ? Sleepiness. ? Weakness. This information is not intended to replace advice given to you by your health care provider. Make sure you  discuss any questions you have with your health care provider. Document Released: 07/19/2005 Document Revised: 04/28/2016 Document Reviewed: 03/22/2016 Elsevier Interactive Patient Education  [employer].

## 2018-04-20 LAB — URINE CULTURE
MICRO NUMBER:: 90774740
SPECIMEN QUALITY:: ADEQUATE

## 2018-04-23 ENCOUNTER — Encounter: Payer: Self-pay | Admitting: *Deleted

## 2018-05-01 ENCOUNTER — Ambulatory Visit
Admission: RE | Admit: 2018-05-01 | Discharge: 2018-05-01 | Disposition: A | Discharge status: Home / Self Care | Payer: Medicare Other | Source: Ambulatory Visit | Attending: Rheumatology | Admitting: Rheumatology

## 2018-05-01 DIAGNOSIS — M48061 Spinal stenosis, lumbar region without neurogenic claudication: Secondary | ICD-10-CM | POA: Insufficient documentation

## 2018-05-01 DIAGNOSIS — M545 Low back pain: Secondary | ICD-10-CM | POA: Diagnosis present

## 2018-05-01 DIAGNOSIS — G8929 Other chronic pain: Secondary | ICD-10-CM | POA: Insufficient documentation

## 2018-05-01 DIAGNOSIS — M5136 Other intervertebral disc degeneration, lumbar region: Secondary | ICD-10-CM | POA: Diagnosis not present

## 2018-05-01 DIAGNOSIS — M4186 Other forms of scoliosis, lumbar region: Secondary | ICD-10-CM | POA: Insufficient documentation

## 2018-05-05 ENCOUNTER — Other Ambulatory Visit: Payer: Self-pay | Admitting: Internal Medicine

## 2018-05-08 ENCOUNTER — Other Ambulatory Visit: Payer: Self-pay | Admitting: Internal Medicine

## 2018-05-08 ENCOUNTER — Encounter: Payer: Self-pay | Admitting: Internal Medicine

## 2018-05-08 ENCOUNTER — Ambulatory Visit
Admission: RE | Admit: 2018-05-08 | Discharge: 2018-05-08 | Disposition: A | Discharge status: Home / Self Care | Payer: Medicare Other | Source: Ambulatory Visit | Attending: Internal Medicine | Admitting: Internal Medicine

## 2018-05-08 DIAGNOSIS — N83201 Unspecified ovarian cyst, right side: Secondary | ICD-10-CM | POA: Insufficient documentation

## 2018-05-08 DIAGNOSIS — I700 Atherosclerosis of aorta: Secondary | ICD-10-CM | POA: Insufficient documentation

## 2018-05-08 DIAGNOSIS — K8689 Other specified diseases of pancreas: Secondary | ICD-10-CM

## 2018-05-08 DIAGNOSIS — R1031 Right lower quadrant pain: Secondary | ICD-10-CM | POA: Insufficient documentation

## 2018-05-08 DIAGNOSIS — D259 Leiomyoma of uterus, unspecified: Secondary | ICD-10-CM | POA: Insufficient documentation

## 2018-05-08 DIAGNOSIS — E875 Hyperkalemia: Secondary | ICD-10-CM

## 2018-05-08 DIAGNOSIS — K862 Cyst of pancreas: Secondary | ICD-10-CM | POA: Insufficient documentation

## 2018-05-08 MED ORDER — IOPAMIDOL (ISOVUE-370) INJECTION 76%
80.0000 mL | Freq: Once | INTRAVENOUS | Status: AC | PRN
  Administered 2018-05-08: 80 mL via INTRAVENOUS

## 2018-05-10 ENCOUNTER — Encounter (INDEPENDENT_AMBULATORY_CARE_PROVIDER_SITE_OTHER): Payer: Self-pay

## 2018-05-10 ENCOUNTER — Other Ambulatory Visit (INDEPENDENT_AMBULATORY_CARE_PROVIDER_SITE_OTHER): Payer: Medicare Other

## 2018-05-10 ENCOUNTER — Encounter: Payer: Self-pay | Admitting: Internal Medicine

## 2018-05-10 DIAGNOSIS — E875 Hyperkalemia: Secondary | ICD-10-CM

## 2018-05-10 DIAGNOSIS — N83201 Unspecified ovarian cyst, right side: Secondary | ICD-10-CM

## 2018-05-10 LAB — BASIC METABOLIC PANEL
BUN: 16 mg/dL (ref 6–23)
CALCIUM: 10.3 mg/dL (ref 8.4–10.5)
CHLORIDE: 101 meq/L (ref 96–112)
CO2: 26 mEq/L (ref 19–32)
CREATININE: 0.87 mg/dL (ref 0.40–1.20)
GFR: 66.86 mL/min (ref >60.00)
GLUCOSE: 98 mg/dL (ref 70–99)
Potassium: 4.5 mEq/L (ref 3.5–5.1)
Sodium: 135 mEq/L (ref 135–145)

## 2018-05-15 NOTE — Telephone Encounter (Signed)
Called Ms Crystal Lynch.  Discussed CT results.  She has MRI scheduled to follow up on pancreas findings.  Also discussed ovarian cyst.  Will refer back to gyn for reevaluation.  Order placed for referral.

## 2018-05-23 ENCOUNTER — Encounter: Payer: Self-pay | Admitting: *Deleted

## 2018-05-23 ENCOUNTER — Ambulatory Visit
Admission: RE | Admit: 2018-05-23 | Discharge: 2018-05-23 | Disposition: A | Discharge status: Home / Self Care | Payer: Medicare Other | Source: Ambulatory Visit | Attending: Internal Medicine | Admitting: Internal Medicine

## 2018-05-23 DIAGNOSIS — K862 Cyst of pancreas: Secondary | ICD-10-CM | POA: Insufficient documentation

## 2018-05-23 DIAGNOSIS — K869 Disease of pancreas, unspecified: Secondary | ICD-10-CM | POA: Diagnosis present

## 2018-05-23 DIAGNOSIS — K8689 Other specified diseases of pancreas: Secondary | ICD-10-CM

## 2018-05-23 MED ORDER — GADOBENATE DIMEGLUMINE 529 MG/ML IV SOLN: 20.0000 mL | Freq: Once | INTRAVENOUS | Status: DC | PRN

## 2018-05-23 MED ORDER — GADOBENATE DIMEGLUMINE 529 MG/ML IV SOLN
10.0000 mL | Freq: Once | INTRAVENOUS | Status: AC | PRN
  Administered 2018-05-23: 10 mL via INTRAVENOUS

## 2018-05-25 ENCOUNTER — Other Ambulatory Visit: Payer: Self-pay | Admitting: Internal Medicine

## 2018-05-25 DIAGNOSIS — K862 Cyst of pancreas: Secondary | ICD-10-CM

## 2018-05-25 NOTE — Progress Notes (Signed)
Order placed for GI referral.  Also gyn referral in place.

## 2018-05-27 ENCOUNTER — Ambulatory Visit: Payer: Medicare Other | Admitting: Internal Medicine

## 2018-06-05 ENCOUNTER — Telehealth: Payer: Self-pay | Admitting: *Deleted

## 2018-06-05 ENCOUNTER — Other Ambulatory Visit (INDEPENDENT_AMBULATORY_CARE_PROVIDER_SITE_OTHER): Payer: Medicare Other

## 2018-06-05 ENCOUNTER — Other Ambulatory Visit: Payer: Self-pay | Admitting: Internal Medicine

## 2018-06-05 DIAGNOSIS — E7800 Pure hypercholesterolemia, unspecified: Secondary | ICD-10-CM

## 2018-06-05 DIAGNOSIS — M8580 Other specified disorders of bone density and structure, unspecified site: Secondary | ICD-10-CM

## 2018-06-05 DIAGNOSIS — I10 Essential (primary) hypertension: Secondary | ICD-10-CM

## 2018-06-05 LAB — BASIC METABOLIC PANEL
BUN: 19 mg/dL (ref 6–23)
CHLORIDE: 100 meq/L (ref 96–112)
CO2: 25 meq/L (ref 19–32)
CREATININE: 0.99 mg/dL (ref 0.40–1.20)
Calcium: 11 mg/dL — ABNORMAL HIGH (ref 8.4–10.5)
GFR: 57.59 mL/min — ABNORMAL LOW (ref >60.00)
Glucose, Bld: 99 mg/dL (ref 70–99)
Potassium: 4.7 mEq/L (ref 3.5–5.1)
Sodium: 135 mEq/L (ref 135–145)

## 2018-06-05 LAB — LIPID PANEL
CHOL/HDL RATIO: 3
Cholesterol: 169 mg/dL (ref 0–200)
HDL: 48.6 mg/dL (ref >39.00)
LDL CALC: 81 mg/dL (ref 0–99)
NonHDL: 119.94
Triglycerides: 196 mg/dL — ABNORMAL HIGH (ref 0.0–149.0)
VLDL: 39.2 mg/dL (ref 0.0–40.0)

## 2018-06-05 LAB — HEPATIC FUNCTION PANEL
ALT: 16 U/L (ref 0–35)
AST: 17 U/L (ref 0–37)
Albumin: 4.5 g/dL (ref 3.5–5.2)
Alkaline Phosphatase: 56 U/L (ref 39–117)
BILIRUBIN DIRECT: 0.2 mg/dL (ref 0.0–0.3)
BILIRUBIN TOTAL: 1.1 mg/dL (ref 0.2–1.2)
Total Protein: 7 g/dL (ref 6.0–8.3)

## 2018-06-05 LAB — VITAMIN D 25 HYDROXY (VIT D DEFICIENCY, FRACTURES): VITD: 45.71 ng/mL (ref 30.00–100.00)

## 2018-06-05 NOTE — Progress Notes (Signed)
Orders placed for labs

## 2018-06-05 NOTE — Telephone Encounter (Signed)
Pt coming in this morning for labs. Please place future order

## 2018-06-05 NOTE — Telephone Encounter (Signed)
Orders placed for labs

## 2018-06-06 ENCOUNTER — Other Ambulatory Visit: Payer: Self-pay | Admitting: Internal Medicine

## 2018-06-06 NOTE — Progress Notes (Signed)
Order placed for f/u calcium check.  

## 2018-06-20 ENCOUNTER — Other Ambulatory Visit (INDEPENDENT_AMBULATORY_CARE_PROVIDER_SITE_OTHER): Payer: Medicare Other

## 2018-06-20 LAB — CALCIUM: CALCIUM: 9.7 mg/dL (ref 8.4–10.5)

## 2018-06-21 ENCOUNTER — Encounter: Payer: Self-pay | Admitting: Internal Medicine

## 2018-06-23 ENCOUNTER — Other Ambulatory Visit: Payer: Self-pay | Admitting: Cardiovascular Disease

## 2018-07-02 ENCOUNTER — Other Ambulatory Visit: Payer: Self-pay | Admitting: Cardiovascular Disease

## 2018-07-30 ENCOUNTER — Other Ambulatory Visit: Payer: Self-pay | Admitting: Cardiovascular Disease

## 2018-08-31 ENCOUNTER — Other Ambulatory Visit: Payer: Self-pay | Admitting: Internal Medicine

## 2018-09-07 ENCOUNTER — Other Ambulatory Visit: Payer: Self-pay | Admitting: Internal Medicine

## 2018-10-02 ENCOUNTER — Other Ambulatory Visit: Payer: Self-pay | Admitting: Cardiovascular Disease

## 2018-10-11 ENCOUNTER — Encounter: Payer: Self-pay | Admitting: Family Medicine

## 2018-10-11 ENCOUNTER — Ambulatory Visit (INDEPENDENT_AMBULATORY_CARE_PROVIDER_SITE_OTHER): Payer: Medicare Other | Admitting: Family Medicine

## 2018-10-11 VITALS — BP 112/80 | HR 73 | Temp 97.6°F | Ht 62.0 in | Wt 136.2 lb

## 2018-10-11 DIAGNOSIS — N3000 Acute cystitis without hematuria: Secondary | ICD-10-CM | POA: Diagnosis not present

## 2018-10-11 DIAGNOSIS — N390 Urinary tract infection, site not specified: Secondary | ICD-10-CM | POA: Insufficient documentation

## 2018-10-11 DIAGNOSIS — R300 Dysuria: Secondary | ICD-10-CM | POA: Insufficient documentation

## 2018-10-11 LAB — POCT URINALYSIS DIPSTICK
Bilirubin, UA: NEGATIVE
Glucose, UA: NEGATIVE
KETONES UA: NEGATIVE
NITRITE UA: NEGATIVE
PH UA: 6 (ref 5.0–8.0)
PROTEIN UA: NEGATIVE
Spec Grav, UA: 1.01 (ref 1.010–1.025)
UROBILINOGEN UA: 0.2 U/dL

## 2018-10-11 MED ORDER — NITROFURANTOIN MONOHYD MACRO 100 MG PO CAPS: 100.0000 mg | ORAL_CAPSULE | Freq: Two times a day (BID) | ORAL | 0 refills | Status: DC

## 2018-10-11 NOTE — Patient Instructions (Signed)
Nice to see you. You likely have a UTI. We will treat you with Macrobid. If you develop abdominal pain, blood in your urine, fevers, or worsening symptoms please be reevaluated.

## 2018-10-11 NOTE — Assessment & Plan Note (Signed)
Symptoms and UA consistent with UTI.  We will send urine for culture and microscopy.  Will start on Macrobid.  Creatinine clearance is greater than 30.  She is given return precautions.

## 2018-10-11 NOTE — Progress Notes (Signed)
  Tommi Rumps, MD Phone: [phone]  Crystal Lynch is a 77 y.o. female who presents today for same-day visit.  CC: Dysuria  Patient reports symptoms starting yesterday.  She notes started with low back pain and then dysuria.  Has urinary frequency and urgency.  No hematuria, abdominal pain, or vaginal discharge.  Back pain is typical with her UTIs.  No fevers.  Social History   Tobacco Use  Smoking Status Never Smoker  Smokeless Tobacco Never Used     ROS see history of present illness  Objective  Physical Exam Vitals:   10/11/18 1459  BP: 112/80  Pulse: 73  Temp: 97.6 F (36.4 C)  SpO2: 99%    BP Readings from Last 3 Encounters:  10/11/18 112/80  04/19/18 112/62  03/01/18 112/70   Wt Readings from Last 3 Encounters:  10/11/18 136 lb 3.2 oz (61.8 kg)  04/19/18 129 lb 12.8 oz (58.9 kg)  03/01/18 128 lb 12.8 oz (58.4 kg)    Physical Exam Constitutional:      General: She is not in acute distress.    Appearance: She is not diaphoretic.  Cardiovascular:     Rate and Rhythm: Normal rate and regular rhythm.     Heart sounds: Normal heart sounds.  Pulmonary:     Effort: Pulmonary effort is normal.     Breath sounds: Normal breath sounds.  Abdominal:     General: Bowel sounds are normal. There is no distension.     Palpations: Abdomen is soft.     Tenderness: There is no abdominal tenderness.  Skin:    General: Skin is warm and dry.  Neurological:     Mental Status: She is alert.      Assessment/Plan: Please see individual problem list.  Acute cystitis without hematuria Symptoms and UA consistent with UTI.  We will send urine for culture and microscopy.  Will start on Macrobid.  Creatinine clearance is greater than 30.  She is given return precautions.   Orders Placed This Encounter  Procedures  . Urine Culture  . Urine Microscopic Only  . POCT Urinalysis Dipstick    Meds ordered this encounter  Medications  . nitrofurantoin,  macrocrystal-monohydrate, (MACROBID) 100 MG capsule    Sig: Take 1 capsule (100 mg total) by mouth 2 (two) times daily.    Dispense:  10 capsule    Refill:  0     Tommi Rumps, MD [place]

## 2018-10-12 LAB — URINALYSIS, MICROSCOPIC ONLY
HYALINE CAST: NONE SEEN /LPF
Squamous Epithelial / LPF: NONE SEEN /HPF (ref <=5)

## 2018-10-13 LAB — URINE CULTURE
MICRO NUMBER:: 91526516
SPECIMEN QUALITY:: ADEQUATE

## 2018-10-15 ENCOUNTER — Telehealth: Payer: Self-pay

## 2018-10-15 NOTE — Telephone Encounter (Signed)
No ans no vm   °

## 2018-10-15 NOTE — Telephone Encounter (Signed)
-----   Message from Alba Destine, [place] sent at 10/02/2018  7:33 AM EST ----- Please schedule an appointment 30 day supply sent to pharmacy.

## 2018-10-24 ENCOUNTER — Ambulatory Visit (INDEPENDENT_AMBULATORY_CARE_PROVIDER_SITE_OTHER): Payer: Medicare Other | Admitting: Cardiovascular Disease

## 2018-10-24 ENCOUNTER — Encounter: Payer: Self-pay | Admitting: Cardiovascular Disease

## 2018-10-24 VITALS — BP 110/62 | HR 66 | Ht 62.0 in | Wt 134.8 lb

## 2018-10-24 DIAGNOSIS — I10 Essential (primary) hypertension: Secondary | ICD-10-CM | POA: Diagnosis not present

## 2018-10-24 DIAGNOSIS — E785 Hyperlipidemia, unspecified: Secondary | ICD-10-CM

## 2018-10-24 NOTE — Progress Notes (Signed)
Cardiology Office Note   Date:  10/24/2018   ID:  Lillar, Bianca 22-Feb-1941, MRN [id number]  PCP:  Einar Pheasant, MD  Cardiologist:   Kathlyn Sacramento, MD   Chief Complaint  Patient presents with  . other    12 month follow up. Meds reviewed by the pt. verbally. "doing well."       History of Present Illness: Jariana Shumard is a 78 y.o. female who presents for  a follow-up visit regarding refractory  hypertension . She has no previous cardiac history. Other medical problems include hyperlipidemia and GERD.  Renal artery angiography in October 2016 showed no significant renal artery stenosis.  She has been doing extremely well and denies any chest pain, shortness of breath or palpitations. Blood pressure has been well-controlled on current medications.   Past Medical History:  Diagnosis Date  . Arthritis   . Chicken pox   . Endometriosis   . Esophagitis    s/p esophageal dilatation  . GERD (gastroesophageal reflux disease)   . History of ovarian cyst   . Hypercholesterolemia   . Hypertension   . Hypothyroidism     Past Surgical History:  Procedure Laterality Date  . ABDOMINAL SURGERY     found to have an ovarian cyst and endometriosis  . APPENDECTOMY       Current Outpatient Medications  Medication Sig Dispense Refill  . amLODipine (NORVASC) 5 MG tablet TAKE 1 TABLET DAILY (PATIENT NEEDS TO SCHEDULE AN APPOINTMENT FOR FURTHER REFILLS) 30 tablet 0  . aspirin 81 MG tablet Take 81 mg by mouth daily.    . carvedilol (COREG) 6.25 MG tablet TAKE 1 TABLET TWICE A DAY 180 tablet 0  . levothyroxine (SYNTHROID, LEVOTHROID) 88 MCG tablet TAKE 1 TABLET DAILY 90 tablet 1  . lisinopril (PRINIVIL,ZESTRIL) 40 MG tablet Take 1 tablet (40 mg total) by mouth daily. 180 tablet 3  . omeprazole (PRILOSEC) 20 MG capsule TAKE 1 CAPSULE DAILY 90 capsule 4  . simvastatin (ZOCOR) 10 MG tablet TAKE 1 TABLET AT BEDTIME 90 tablet 4  . spironolactone (ALDACTONE) 25 MG tablet TAKE 1  TABLET DAILY 90 tablet 3   No current facility-administered medications for this visit.     Allergies:   Meloxicam    Social History:  The patient  reports that she has never smoked. She has never used smokeless tobacco. She reports that she does not drink alcohol or use drugs.   Family History:  The patient's family history includes Breast cancer (age of onset: 12) in her maternal aunt; Breast cancer (age of onset: 47) in her sister; Breast cancer (age of onset: 42) in her mother; Heart attack in her father and mother; Heart disease in her mother; Hyperlipidemia in her mother; Hypertension in her brother, mother, and sister; Lung disease in her father.    ROS:  Please see the history of present illness.   Otherwise, review of systems are positive for none.   All other systems are reviewed and negative.    PHYSICAL EXAM: VS:  BP 110/62 (BP Location: Left Arm, Patient Position: Sitting, Cuff Size: Normal)   Pulse 66   Ht 5\' 2"  (1.575 m)   Wt 134 lb 12 oz (61.1 kg)   LMP 10/21/1984   BMI 24.65 kg/m  , BMI Body mass index is 24.65 kg/m. GEN: Well nourished, well developed, in no acute distress  HEENT: normal  Neck: no JVD, carotid bruits, or masses Cardiac: RRR; no murmurs, rubs, or  gallops,no edema  Respiratory:  clear to auscultation bilaterally, normal work of breathing GI: soft, nontender, nondistended, + BS MS: no deformity or atrophy  Skin: warm and dry, no rash Neuro:  Strength and sensation are intact Psych: euthymic mood, full affect   EKG:  EKG is ordered today. The ekg ordered today demonstrates  normal sinus rhythm with no significant ST or T wave changes.   Recent Labs: 01/24/2018: TSH 1.02 04/19/2018: Hemoglobin 13.7; Platelets 280.0 06/05/2018: ALT 16; BUN 19; Creatinine, Ser 0.99; Potassium 4.7; Sodium 135    Lipid Panel    Component Value Date/Time   CHOL 169 06/05/2018 0822   TRIG 196.0 (H) 06/05/2018 0822   HDL 48.60 06/05/2018 0822   CHOLHDL 3  06/05/2018 0822   VLDL 39.2 06/05/2018 0822   LDLCALC 81 06/05/2018 0822   LDLDIRECT 94.0 01/04/2017 0823      Wt Readings from Last 3 Encounters:  10/24/18 134 lb 12 oz (61.1 kg)  10/11/18 136 lb 3.2 oz (61.8 kg)  04/19/18 129 lb 12.8 oz (58.9 kg)       No flowsheet data found.    ASSESSMENT AND PLAN:  1.  Essential hypertension: Blood pressure is well controlled on current medications including carvedilol, lisinopril and spironolactone.Continue same medications. I reviewed her labs in August which were unremarkable except for mild hypercalcemia.  Calcium supplement was stopped after that with recommendations to increase hydration.  Subsequently, calcium level came back normal.  2. Hyperlipidemia: Currently well controlled on current dose of simvastatin. Most recent LDL was 81.  Triglyceride was mildly elevated.  I advised the patient to increase physical activities and start an exercise program.    Disposition:   FU with me in 1 year  Signed,  Kathlyn Sacramento, MD  10/24/2018 10:25 AM    [place]

## 2018-10-24 NOTE — Patient Instructions (Signed)
Medication Instructions:  No changes  If you need a refill on your cardiac medications before your next appointment, please call your pharmacy.   Lab work: None ordered  Testing/Procedures: None ordered  Follow-Up: At [facility], you and your health needs are our priority.  As part of our continuing mission to provide you with exceptional heart care, we have created designated Provider Care Teams.  These Care Teams include your primary Cardiologist (physician) and Advanced Practice Providers (APPs -  Physician Assistants and Nurse Practitioners) who all work together to provide you with the care you need, when you need it. You will need a follow up appointment in 12 months.  Please call our office 2 months in advance to schedule this appointment.  You may see Dr. Arida or one of the following Advanced Practice Providers on your designated Care Team:   Christopher Berge, NP Ryan Dunn, PA-C . Jacquelyn Visser, PA-C    

## 2018-10-28 ENCOUNTER — Other Ambulatory Visit: Payer: Self-pay | Admitting: Cardiovascular Disease

## 2018-11-02 ENCOUNTER — Other Ambulatory Visit: Payer: Self-pay | Admitting: Internal Medicine

## 2018-11-02 ENCOUNTER — Other Ambulatory Visit: Payer: Self-pay | Admitting: Cardiovascular Disease

## 2018-11-28 ENCOUNTER — Telehealth: Payer: Self-pay

## 2018-11-28 NOTE — Telephone Encounter (Signed)
Copied from [place] [id number]. Topic: General - Other >> Nov 28, 2018 11:31 AM Oneta Rack wrote:  Relation to OI:PPGF  Call back number: [phone]  Reason for call:  Patient requesting urine orders due to UTI, patient experiencing  frequent and urgency but not voiding, patient states its recurring, please advise

## 2018-11-28 NOTE — Telephone Encounter (Signed)
Pt scheduled for appt.

## 2018-11-29 ENCOUNTER — Encounter: Payer: Self-pay | Admitting: Internal Medicine

## 2018-11-29 ENCOUNTER — Ambulatory Visit (INDEPENDENT_AMBULATORY_CARE_PROVIDER_SITE_OTHER): Payer: Medicare Other | Admitting: Internal Medicine

## 2018-11-29 VITALS — BP 118/75 | HR 75 | Temp 97.9°F | Resp 16 | Wt 136.0 lb

## 2018-11-29 DIAGNOSIS — R300 Dysuria: Secondary | ICD-10-CM

## 2018-11-29 DIAGNOSIS — I10 Essential (primary) hypertension: Secondary | ICD-10-CM

## 2018-11-29 LAB — POCT URINALYSIS DIPSTICK
BILIRUBIN UA: NEGATIVE
Glucose, UA: NEGATIVE
KETONES UA: NEGATIVE
Nitrite, UA: POSITIVE
PH UA: 5.5 (ref 5.0–8.0)
PROTEIN UA: POSITIVE — AB
SPEC GRAV UA: 1.02 (ref 1.010–1.025)
UROBILINOGEN UA: NEGATIVE U/dL — AB

## 2018-11-29 LAB — URINALYSIS, MICROSCOPIC ONLY

## 2018-11-29 MED ORDER — NITROFURANTOIN MONOHYD MACRO 100 MG PO CAPS: 100.0000 mg | ORAL_CAPSULE | Freq: Two times a day (BID) | ORAL | 0 refills | Status: DC

## 2018-11-29 NOTE — Patient Instructions (Signed)
Take a probiotic daily while you are on the antibiotics and for two weeks after completing the antibiotic.   

## 2018-11-29 NOTE — Progress Notes (Signed)
Patient ID: Crystal Lynch, female   DOB: 01-19-1941, 78 y.o.   MRN: [id number]   Subjective:    Patient ID: Crystal Lynch, female    DOB: 1941/06/30, 78 y.o.   MRN: [id number]  HPI  Patient here as a work in with concerns regarding a possible urinary tract infection.  She reports symptoms started 5-6 days ago.  Noticed dysuria.  No vomiting.  No diarrhea.  Cloudy urine.  Some suprapubic pressure.  No back pain.  No vaginal symptoms.  Feel similar to previous urinary tract infections.     Past Medical History:  Diagnosis Date  . Arthritis   . Chicken pox   . Endometriosis   . Esophagitis    s/p esophageal dilatation  . GERD (gastroesophageal reflux disease)   . History of ovarian cyst   . Hypercholesterolemia   . Hypertension   . Hypothyroidism    Past Surgical History:  Procedure Laterality Date  . ABDOMINAL SURGERY     found to have an ovarian cyst and endometriosis  . APPENDECTOMY     Family History  Problem Relation Age of Onset  . Lung disease Father   . Heart attack Father   . Heart disease Mother        myocardial infarction  . Breast cancer Mother 66  . Heart attack Mother   . Hypertension Mother   . Hyperlipidemia Mother   . Breast cancer Sister 90  . Hypertension Sister   . Breast cancer Maternal Aunt 74  . Hypertension Brother   . Colon cancer Neg Hx    Social History   Socioeconomic History  . Marital status: Married    Spouse name: Not on file  . Number of children: Not on file  . Years of education: Not on file  . Highest education level: Not on file  Occupational History  . Not on file  Social Needs  . Financial resource strain: Not hard at all  . Food insecurity:    Worry: Never true    Inability: Never true  . Transportation needs:    Medical: No    Non-medical: No  Tobacco Use  . Smoking status: Never Smoker  . Smokeless tobacco: Never Used  Substance and Sexual Activity  . Alcohol use: No    Alcohol/week: 0.0 standard drinks    . Drug use: No  . Sexual activity: Never  Lifestyle  . Physical activity:    Days per week: Not on file    Minutes per session: Not on file  . Stress: Not at all  Relationships  . Social connections:    Talks on phone: Not on file    Gets together: Not on file    Attends religious service: Not on file    Active member of club or organization: Not on file    Attends meetings of clubs or organizations: Not on file    Relationship status: Not on file  Other Topics Concern  . Not on file  Social History Narrative  . Not on file    Outpatient Encounter Medications as of 11/29/2018  Medication Sig  . amLODipine (NORVASC) 5 MG tablet Take 1 tablet (5 mg total) by mouth daily.  . carvedilol (COREG) 6.25 MG tablet TAKE 1 TABLET TWICE A DAY (NEED APPOINTMENT FOR FURTHER REFILLS)  . levothyroxine (SYNTHROID, LEVOTHROID) 88 MCG tablet TAKE 1 TABLET DAILY  . lisinopril (PRINIVIL,ZESTRIL) 40 MG tablet Take 1 tablet (40 mg total) by mouth daily.  Marland Kitchen omeprazole (  PRILOSEC) 20 MG capsule TAKE 1 CAPSULE DAILY  . simvastatin (ZOCOR) 10 MG tablet TAKE 1 TABLET AT BEDTIME  . spironolactone (ALDACTONE) 25 MG tablet TAKE 1 TABLET DAILY  . nitrofurantoin, macrocrystal-monohydrate, (MACROBID) 100 MG capsule Take 1 capsule (100 mg total) by mouth 2 (two) times daily.  . [DISCONTINUED] aspirin 81 MG tablet Take 81 mg by mouth daily.   No facility-administered encounter medications on file as of 11/29/2018.     Review of Systems  Constitutional: Negative for appetite change and fever.  Gastrointestinal: Negative for abdominal pain, diarrhea, nausea and vomiting.  Genitourinary: Positive for dysuria. Negative for vaginal discharge.       Cloudy urine.   Musculoskeletal: Negative for back pain and joint swelling.  Skin: Negative for color change and rash.       Objective:    Physical Exam Constitutional:      General: She is not in acute distress.    Appearance: Normal appearance.  Neck:      Thyroid: No thyromegaly.  Cardiovascular:     Rate and Rhythm: Normal rate and regular rhythm.  Pulmonary:     Effort: No respiratory distress.     Breath sounds: Normal breath sounds. No wheezing.  Abdominal:     General: Bowel sounds are normal.     Palpations: Abdomen is soft.     Tenderness: There is no abdominal tenderness.  Musculoskeletal:     Comments: No back pain or tenderness to palpation.  No CVA tenderness.    Skin:    Findings: No erythema or rash.  Neurological:     Mental Status: She is alert.  Psychiatric:        Mood and Affect: Mood normal.        Behavior: Behavior normal.     BP 118/75   Pulse 75   Temp 97.9 F (36.6 C) (Oral)   Resp 16   Wt 136 lb (61.7 kg)   LMP 10/21/1984   SpO2 98%   BMI 24.87 kg/m  Wt Readings from Last 3 Encounters:  11/29/18 136 lb (61.7 kg)  10/24/18 134 lb 12 oz (61.1 kg)  10/11/18 136 lb 3.2 oz (61.8 kg)     Lab Results  Component Value Date   WBC 6.5 04/19/2018   HGB 13.7 04/19/2018   HCT 40.1 04/19/2018   PLT 280.0 04/19/2018   GLUCOSE 99 06/05/2018   CHOL 169 06/05/2018   TRIG 196.0 (H) 06/05/2018   HDL 48.60 06/05/2018   LDLDIRECT 94.0 01/04/2017   LDLCALC 81 06/05/2018   ALT 16 06/05/2018   AST 17 06/05/2018   NA 135 06/05/2018   K 4.7 06/05/2018   CL 100 06/05/2018   CREATININE 0.99 06/05/2018   BUN 19 06/05/2018   CO2 25 06/05/2018   TSH 1.02 01/24/2018   INR 1.0 08/12/2015    Mr Abdomen W Wo Contrast  Result Date: 05/23/2018 CLINICAL DATA:  Indeterminate lesion within the pancreas on CT exam. Abdominal pain. Night sweats. EXAM: MRI ABDOMEN WITHOUT AND WITH CONTRAST TECHNIQUE: Multiplanar multisequence MR imaging of the abdomen was performed both before and after the administration of intravenous contrast. CONTRAST:  46mL MULTIHANCE GADOBENATE DIMEGLUMINE 529 MG/ML IV SOLN COMPARISON:  CT 05/08/2018 FINDINGS: Lower chest:  Lung bases are clear. Hepatobiliary: No focal hepatic lesion. Normal signal  intensity. No duct dilatation. Normal gallbladder. Normal common bile duct. Pancreas: Pancreatic parenchyma is normal high signal intensity on T 1 weighted imaging precontrast. Some mild ductal ectasia through the  uncinate which correlates with low-density findings on comparison CT. One cystic lesion is present in the neck of the pancreas measuring 6 mm (image 44/15). This is adjacent to the duct and may communicate. Several smaller similar cystic lesion tail the pancreas similar size (5 mm) with cluster of 4 lesions (image 17/10. Spleen: Normal spleen. Adrenals/urinary tract: Adrenal glands and kidneys are normal. Stomach/Bowel: Stomach and limited of the small bowel is unremarkable Vascular/Lymphatic: Abdominal aortic normal caliber. No retroperitoneal periportal lymphadenopathy. Musculoskeletal: No aggressive osseous lesion IMPRESSION: 1. No worrisome pancreatic lesion. 2. 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 This recommendation follows ACR consensus guidelines: Management of Incidental Pancreatic Cysts: A White Paper of the ACR Incidental Findings Committee. [place] 7290;21:115-520. Recommend follow-up MRI in 2 years per consensus criteria. Electronically Signed   By: Suzy Bouchard M.D.   On: 05/23/2018 11:04       Assessment & Plan:   Problem List Items Addressed This Visit    Hypertension    Blood pressure under good control.  Continue same medication regimen.  Follow pressures.  Follow metabolic panel.         Other Visit Diagnoses    Dysuria    -  Primary   Urine dip c/w uti.  Treat with macrobid.  Send culture.  Discussed urology evaluation.     Relevant Orders   POCT urinalysis dipstick (Completed)   Urine Microscopic (Completed)   Urine Culture (Completed)       Einar Pheasant, MD

## 2018-12-01 LAB — URINE CULTURE
MICRO NUMBER:: 166681
SPECIMEN QUALITY:: ADEQUATE

## 2018-12-02 ENCOUNTER — Other Ambulatory Visit: Payer: Self-pay | Admitting: Internal Medicine

## 2018-12-02 DIAGNOSIS — R319 Hematuria, unspecified: Secondary | ICD-10-CM

## 2018-12-02 NOTE — Progress Notes (Signed)
Order placed for f/u urine and culture.

## 2018-12-08 ENCOUNTER — Encounter: Payer: Self-pay | Admitting: Internal Medicine

## 2018-12-08 NOTE — Assessment & Plan Note (Signed)
Blood pressure under good control.  Continue same medication regimen.  Follow pressures.  Follow metabolic panel.   

## 2018-12-20 ENCOUNTER — Other Ambulatory Visit (INDEPENDENT_AMBULATORY_CARE_PROVIDER_SITE_OTHER): Payer: Medicare Other

## 2018-12-20 DIAGNOSIS — R319 Hematuria, unspecified: Secondary | ICD-10-CM

## 2018-12-20 LAB — URINALYSIS, ROUTINE W REFLEX MICROSCOPIC
Bilirubin Urine: NEGATIVE
Ketones, ur: NEGATIVE
Nitrite: POSITIVE — AB
PH: 5.5 (ref 5.0–8.0)
Specific Gravity, Urine: 1.01 (ref 1.000–1.030)
Total Protein, Urine: NEGATIVE
Urine Glucose: NEGATIVE
Urobilinogen, UA: 0.2 (ref 0.0–1.0)

## 2018-12-22 LAB — URINE CULTURE
MICRO NUMBER:: 256523
SPECIMEN QUALITY:: ADEQUATE

## 2018-12-23 ENCOUNTER — Other Ambulatory Visit: Payer: Self-pay

## 2018-12-23 MED ORDER — CEFDINIR 300 MG PO CAPS: 300.0000 mg | ORAL_CAPSULE | Freq: Two times a day (BID) | ORAL | 0 refills | Status: DC

## 2018-12-24 ENCOUNTER — Ambulatory Visit (INDEPENDENT_AMBULATORY_CARE_PROVIDER_SITE_OTHER): Payer: Medicare Other | Admitting: Internal Medicine

## 2018-12-24 ENCOUNTER — Other Ambulatory Visit: Payer: Self-pay | Admitting: Internal Medicine

## 2018-12-24 ENCOUNTER — Encounter: Payer: Self-pay | Admitting: Internal Medicine

## 2018-12-24 VITALS — BP 108/62 | HR 76 | Temp 98.3°F | Resp 16 | Wt 136.4 lb

## 2018-12-24 DIAGNOSIS — I10 Essential (primary) hypertension: Secondary | ICD-10-CM

## 2018-12-24 DIAGNOSIS — E871 Hypo-osmolality and hyponatremia: Secondary | ICD-10-CM

## 2018-12-24 DIAGNOSIS — R509 Fever, unspecified: Secondary | ICD-10-CM | POA: Insufficient documentation

## 2018-12-24 DIAGNOSIS — R319 Hematuria, unspecified: Secondary | ICD-10-CM | POA: Diagnosis not present

## 2018-12-24 DIAGNOSIS — N390 Urinary tract infection, site not specified: Secondary | ICD-10-CM

## 2018-12-24 DIAGNOSIS — E039 Hypothyroidism, unspecified: Secondary | ICD-10-CM

## 2018-12-24 LAB — CBC WITH DIFFERENTIAL/PLATELET
BASOS ABS: 0 10*3/uL (ref 0.0–0.1)
Basophils Relative: 0.3 % (ref 0.0–3.0)
EOS ABS: 0 10*3/uL (ref 0.0–0.7)
Eosinophils Relative: 0.1 % (ref 0.0–5.0)
HCT: 35.7 % — ABNORMAL LOW (ref 36.0–46.0)
Hemoglobin: 12 g/dL (ref 12.0–15.0)
LYMPHS ABS: 1 10*3/uL (ref 0.7–4.0)
LYMPHS PCT: 10 % — AB (ref 12.0–46.0)
MCHC: 33.7 g/dL (ref 30.0–36.0)
MCV: 90.4 fl (ref 78.0–100.0)
Monocytes Absolute: 1.4 10*3/uL — ABNORMAL HIGH (ref 0.1–1.0)
Monocytes Relative: 14.2 % — ABNORMAL HIGH (ref 3.0–12.0)
NEUTROS ABS: 7.5 10*3/uL (ref 1.4–7.7)
NEUTROS PCT: 75.4 % (ref 43.0–77.0)
PLATELETS: 205 10*3/uL (ref 150.0–400.0)
RBC: 3.95 Mil/uL (ref 3.87–5.11)
RDW: 12.5 % (ref 11.5–15.5)
WBC: 10 10*3/uL (ref 4.0–10.5)

## 2018-12-24 LAB — BASIC METABOLIC PANEL
BUN: 16 mg/dL (ref 6–23)
CO2: 22 mEq/L (ref 19–32)
Calcium: 9.4 mg/dL (ref 8.4–10.5)
Chloride: 96 mEq/L (ref 96–112)
Creatinine, Ser: 0.87 mg/dL (ref 0.40–1.20)
GFR: 62.8 mL/min (ref >60.00)
Glucose, Bld: 116 mg/dL — ABNORMAL HIGH (ref 70–99)
POTASSIUM: 3.5 meq/L (ref 3.5–5.1)
SODIUM: 127 meq/L — AB (ref 135–145)

## 2018-12-24 LAB — TSH: TSH: 0.67 u[IU]/mL (ref 0.35–4.50)

## 2018-12-24 NOTE — Assessment & Plan Note (Signed)
On thyroid replacement.  Pt requested tsh to be checked.

## 2018-12-24 NOTE — Assessment & Plan Note (Signed)
Treat current infection.  Given persistent recurring infections, refer to urology.

## 2018-12-24 NOTE — Progress Notes (Signed)
Order placed for f/u sodium.  ?

## 2018-12-24 NOTE — Assessment & Plan Note (Signed)
Blood pressure under good control.  Continue same medication regimen.  Follow pressures.  Follow metabolic panel.   

## 2018-12-24 NOTE — Assessment & Plan Note (Signed)
Urine recheck revealed red blood cells.  Culture revealed infection.  Treat infection.  Will need f/u to confirm blood clears.  Discussed urology evaluation given persistent recurring infections.  She is agreeable.

## 2018-12-24 NOTE — Assessment & Plan Note (Addendum)
Presented with fever and chills.  Recent urine culture revealed infection.  On abx now.  Feeling some better.  Continue current abx.  Check cbc and metabolic panel.  Follow closely.

## 2018-12-24 NOTE — Progress Notes (Signed)
Patient ID: [name], female   DOB: 07-May-1941, 78 y.o.   MRN: [id number]   Subjective:    Patient ID: [name], female    DOB: Apr 08, 1941, 78 y.o.   MRN: [id number]  HPI  Patient here as a work in with concerns regarding fever and chills and just not feeling well.  She reports she was feeling fine last week.  Went to a party Friday night.  Woke Saturday and states 'just felt washed out".  Fever that evening.  Tmax 101.  No headache.  No dizziness.  No sinus pressure or congestion.  No sore throat.  No cough or chest congestion.  No sob.  Has not traveled out of the country.  No nausea or vomiting.  Had minimal diarrhea on Sunday, but no diarrhea since.  Normal bowel movement yesterday.  Had a urine repeated a few days ago, just to confirm blood cleared.  Culture returned yesterday positive for infection.  Was started on abx yesterday.  States she feels some better today.  Feels like she has more energy.  Is trying to stay hydrated.  Some decreased appetite.     Past Medical History:  Diagnosis Date  . Arthritis   . Chicken pox   . Endometriosis   . Esophagitis    s/p esophageal dilatation  . GERD (gastroesophageal reflux disease)   . History of ovarian cyst   . Hypercholesterolemia   . Hypertension   . Hypothyroidism    Past Surgical History:  Procedure Laterality Date  . ABDOMINAL SURGERY     found to have an ovarian cyst and endometriosis  . APPENDECTOMY     Family History  Problem Relation Age of Onset  . Lung disease Father   . Heart attack Father   . Heart disease Mother        myocardial infarction  . Breast cancer Mother 39  . Heart attack Mother   . Hypertension Mother   . Hyperlipidemia Mother   . Breast cancer Sister 93  . Hypertension Sister   . Breast cancer Maternal Aunt 25  . Hypertension Brother   . Colon cancer Neg Hx    Social History   Socioeconomic History  . Marital status: Married    Spouse name: Not on file  . Number of children:  Not on file  . Years of education: Not on file  . Highest education level: Not on file  Occupational History  . Not on file  Social Needs  . Financial resource strain: Not hard at all  . Food insecurity:    Worry: Never true    Inability: Never true  . Transportation needs:    Medical: No    Non-medical: No  Tobacco Use  . Smoking status: Never Smoker  . Smokeless tobacco: Never Used  Substance and Sexual Activity  . Alcohol use: No    Alcohol/week: 0.0 standard drinks  . Drug use: No  . Sexual activity: Never  Lifestyle  . Physical activity:    Days per week: Not on file    Minutes per session: Not on file  . Stress: Not at all  Relationships  . Social connections:    Talks on phone: Not on file    Gets together: Not on file    Attends religious service: Not on file    Active member of club or organization: Not on file    Attends meetings of clubs or organizations: Not on file    Relationship status:  Not on file  Other Topics Concern  . Not on file  Social History Narrative  . Not on file    Outpatient Encounter Medications as of 12/24/2018  Medication Sig  . amLODipine (NORVASC) 5 MG tablet Take 1 tablet (5 mg total) by mouth daily.  . carvedilol (COREG) 6.25 MG tablet TAKE 1 TABLET TWICE A DAY (NEED APPOINTMENT FOR FURTHER REFILLS)  . cefdinir (OMNICEF) 300 MG capsule Take 1 capsule (300 mg total) by mouth 2 (two) times daily.  Marland Kitchen levothyroxine (SYNTHROID, LEVOTHROID) 88 MCG tablet TAKE 1 TABLET DAILY  . lisinopril (PRINIVIL,ZESTRIL) 40 MG tablet Take 1 tablet (40 mg total) by mouth daily.  Marland Kitchen omeprazole (PRILOSEC) 20 MG capsule TAKE 1 CAPSULE DAILY  . simvastatin (ZOCOR) 10 MG tablet TAKE 1 TABLET AT BEDTIME  . spironolactone (ALDACTONE) 25 MG tablet TAKE 1 TABLET DAILY   No facility-administered encounter medications on file as of 12/24/2018.     Review of Systems  Constitutional: Positive for chills, fatigue and fever.       Some decreased appetite.    HENT:  Negative for congestion and sinus pressure.   Respiratory: Negative for cough, chest tightness and shortness of breath.   Cardiovascular: Negative for chest pain and leg swelling.  Gastrointestinal: Negative for abdominal pain, nausea and vomiting.       Minimal diarrhea.    Genitourinary: Negative for difficulty urinating and pelvic pain.       States she may feel a little sensation with urination, but no burning.  No hematuria.  No vaginal symptoms.    Musculoskeletal: Negative for back pain and myalgias.  Skin: Negative for color change and rash.  Neurological: Negative for dizziness and headaches.  Psychiatric/Behavioral: Negative for agitation and dysphoric mood.       Objective:     Blood pressure rechecked by me:  112/62  Physical Exam Constitutional:      General: She is not in acute distress.    Appearance: Normal appearance.  HENT:     Nose: Nose normal. No congestion.     Mouth/Throat:     Pharynx: No oropharyngeal exudate or posterior oropharyngeal erythema.  Neck:     Musculoskeletal: Neck supple. No muscular tenderness.     Thyroid: No thyromegaly.  Cardiovascular:     Rate and Rhythm: Normal rate and regular rhythm.  Pulmonary:     Effort: No respiratory distress.     Breath sounds: Normal breath sounds. No wheezing.  Abdominal:     General: Bowel sounds are normal.     Palpations: Abdomen is soft.     Tenderness: There is no abdominal tenderness.  Musculoskeletal:        General: No swelling or tenderness.  Lymphadenopathy:     Cervical: No cervical adenopathy.  Skin:    Findings: No erythema or rash.  Neurological:     Mental Status: She is alert.  Psychiatric:        Mood and Affect: Mood normal.        Behavior: Behavior normal.     BP 108/62   Pulse 76   Temp 98.3 F (36.8 C) (Oral)   Resp 16   Wt 136 lb 6.4 oz (61.9 kg)   LMP 10/21/1984   SpO2 97%   BMI 24.95 kg/m  Wt Readings from Last 3 Encounters:  12/24/18 136 lb 6.4 oz (61.9 kg)   11/29/18 136 lb (61.7 kg)  10/24/18 134 lb 12 oz (61.1 kg)  Lab Results  Component Value Date   WBC 10.0 12/24/2018   HGB 12.0 12/24/2018   HCT 35.7 (L) 12/24/2018   PLT 205.0 12/24/2018   GLUCOSE 116 (H) 12/24/2018   CHOL 169 06/05/2018   TRIG 196.0 (H) 06/05/2018   HDL 48.60 06/05/2018   LDLDIRECT 94.0 01/04/2017   LDLCALC 81 06/05/2018   ALT 16 06/05/2018   AST 17 06/05/2018   NA 127 (L) 12/24/2018   K 3.5 12/24/2018   CL 96 12/24/2018   CREATININE 0.87 12/24/2018   BUN 16 12/24/2018   CO2 22 12/24/2018   TSH 0.67 12/24/2018   INR 1.0 08/12/2015    Mr Abdomen W Wo Contrast  Result Date: 05/23/2018 CLINICAL DATA:  Indeterminate lesion within the pancreas on CT exam. Abdominal pain. Night sweats. EXAM: MRI ABDOMEN WITHOUT AND WITH CONTRAST TECHNIQUE: Multiplanar multisequence MR imaging of the abdomen was performed both before and after the administration of intravenous contrast. CONTRAST:  34mL MULTIHANCE GADOBENATE DIMEGLUMINE 529 MG/ML IV SOLN COMPARISON:  CT 05/08/2018 FINDINGS: Lower chest:  Lung bases are clear. Hepatobiliary: No focal hepatic lesion. Normal signal intensity. No duct dilatation. Normal gallbladder. Normal common bile duct. Pancreas: Pancreatic parenchyma is normal high signal intensity on T 1 weighted imaging precontrast. Some mild ductal ectasia through the uncinate which correlates with low-density findings on comparison CT. One cystic lesion is present in the neck of the pancreas measuring 6 mm (image 44/15). This is adjacent to the duct and may communicate. Several smaller similar cystic lesion tail the pancreas similar size (5 mm) with cluster of 4 lesions (image 17/10. Spleen: Normal spleen. Adrenals/urinary tract: Adrenal glands and kidneys are normal. Stomach/Bowel: Stomach and limited of the small bowel is unremarkable Vascular/Lymphatic: Abdominal aortic normal caliber. No retroperitoneal periportal lymphadenopathy. Musculoskeletal: No aggressive  osseous lesion IMPRESSION: 1. No worrisome pancreatic lesion. 2. 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 This recommendation follows ACR consensus guidelines: Management of Incidental Pancreatic Cysts: A White Paper of the ACR Incidental Findings Committee. [place] 6734;19:[id number]. Recommend follow-up MRI in 2 years per consensus criteria. Electronically Signed   By: Suzy Bouchard M.D.   On: 05/23/2018 11:04       Assessment & Plan:   Problem List Items Addressed This Visit    Fever    Presented with fever and chills.  Recent urine culture revealed infection.  On abx now.  Feeling some better.  Continue current abx.  Check cbc and metabolic panel.  Follow closely.        Relevant Orders   CBC with Differential/Platelet (Completed)   Frequent urinary tract infections    Treat current infection.  Given persistent recurring infections, refer to urology.        Relevant Orders   Ambulatory referral to Urology   Hematuria    Urine recheck revealed red blood cells.  Culture revealed infection.  Treat infection.  Will need f/u to confirm blood clears.  Discussed urology evaluation given persistent recurring infections.  She is agreeable.       Relevant Orders   Ambulatory referral to Urology   Hypertension - Primary    Blood pressure under good control.  Continue same medication regimen.  Follow pressures.  Follow metabolic panel.        Relevant Orders   Basic metabolic panel (Completed)   Hypothyroidism  On thyroid replacement.  Pt requested tsh to be checked.        Relevant Orders   TSH (Completed)       Einar Pheasant, MD

## 2018-12-25 ENCOUNTER — Other Ambulatory Visit (INDEPENDENT_AMBULATORY_CARE_PROVIDER_SITE_OTHER): Payer: Medicare Other

## 2018-12-25 DIAGNOSIS — E871 Hypo-osmolality and hyponatremia: Secondary | ICD-10-CM | POA: Diagnosis not present

## 2018-12-25 LAB — SODIUM: Sodium: 131 mEq/L — ABNORMAL LOW (ref 135–145)

## 2018-12-26 ENCOUNTER — Other Ambulatory Visit: Payer: Self-pay | Admitting: Internal Medicine

## 2018-12-26 DIAGNOSIS — E871 Hypo-osmolality and hyponatremia: Secondary | ICD-10-CM

## 2018-12-26 NOTE — Progress Notes (Signed)
Order placed for f/u sodium check.   

## 2019-01-03 ENCOUNTER — Other Ambulatory Visit: Payer: Self-pay

## 2019-01-03 ENCOUNTER — Other Ambulatory Visit (INDEPENDENT_AMBULATORY_CARE_PROVIDER_SITE_OTHER): Payer: Medicare Other

## 2019-01-03 DIAGNOSIS — E871 Hypo-osmolality and hyponatremia: Secondary | ICD-10-CM

## 2019-01-03 LAB — SODIUM: Sodium: 137 mEq/L (ref 135–145)

## 2019-01-04 ENCOUNTER — Encounter: Payer: Self-pay | Admitting: Internal Medicine

## 2019-01-17 ENCOUNTER — Encounter: Payer: Self-pay | Admitting: Internal Medicine

## 2019-01-29 ENCOUNTER — Ambulatory Visit: Payer: Self-pay | Admitting: Urology

## 2019-02-03 ENCOUNTER — Other Ambulatory Visit: Payer: Self-pay | Admitting: Cardiovascular Disease

## 2019-02-06 ENCOUNTER — Ambulatory Visit: Payer: Self-pay | Admitting: Urology

## 2019-03-03 ENCOUNTER — Other Ambulatory Visit: Payer: Self-pay | Admitting: "Neonatal

## 2019-03-03 ENCOUNTER — Encounter: Payer: Self-pay | Admitting: Internal Medicine

## 2019-03-03 ENCOUNTER — Other Ambulatory Visit: Payer: Self-pay

## 2019-03-03 ENCOUNTER — Other Ambulatory Visit: Payer: Self-pay | Admitting: Internal Medicine

## 2019-03-03 ENCOUNTER — Ambulatory Visit (INDEPENDENT_AMBULATORY_CARE_PROVIDER_SITE_OTHER): Payer: Medicare Other

## 2019-03-03 ENCOUNTER — Ambulatory Visit (INDEPENDENT_AMBULATORY_CARE_PROVIDER_SITE_OTHER): Payer: Medicare Other | Admitting: Internal Medicine

## 2019-03-03 DIAGNOSIS — K209 Esophagitis, unspecified without bleeding: Secondary | ICD-10-CM

## 2019-03-03 DIAGNOSIS — Z0000 Encounter for general adult medical examination without abnormal findings: Secondary | ICD-10-CM

## 2019-03-03 DIAGNOSIS — Z1231 Encounter for screening mammogram for malignant neoplasm of breast: Secondary | ICD-10-CM

## 2019-03-03 DIAGNOSIS — E039 Hypothyroidism, unspecified: Secondary | ICD-10-CM

## 2019-03-03 DIAGNOSIS — E7800 Pure hypercholesterolemia, unspecified: Secondary | ICD-10-CM | POA: Diagnosis not present

## 2019-03-03 DIAGNOSIS — N390 Urinary tract infection, site not specified: Secondary | ICD-10-CM

## 2019-03-03 DIAGNOSIS — I10 Essential (primary) hypertension: Secondary | ICD-10-CM | POA: Diagnosis not present

## 2019-03-03 NOTE — Progress Notes (Signed)
Subjective:   Crystal Lynch is a 78 y.o. female who presents for Medicare Annual (Subsequent) preventive examination.  Review of Systems:  No ROS.  Medicare Wellness Virtual Visit.  Visual/audio telehealth visit, UTA vital signs.   See social history for additional risk factors.   Cardiac Risk Factors include: advanced age (>24men, >75 women);hypertension     Objective:     Vitals: LMP 10/21/1984   There is no height or weight on file to calculate BMI.  Advanced Directives 03/03/2019 03/01/2018 09/04/2017 02/28/2017 08/18/2015  Does Patient Have a Medical Advance Directive? Yes Yes Yes Yes Yes  Type of Advance Directive - [place];Living will [place];Living will [place];Living will [place];Living will  Does patient want to make changes to medical advance directive? No - Patient declined No - Patient declined No - Patient declined No - Patient declined No - Patient declined  Copy of Healthcare Power of Attorney in Chart? - No - copy requested - No - copy requested No - copy requested    Tobacco Social History   Tobacco Use  Smoking Status Never Smoker  Smokeless Tobacco Never Used     Counseling given: Not Answered   Clinical Intake:  Pre-visit preparation completed: Yes           How often do you need to have someone help you when you read instructions, pamphlets, or other written materials from your doctor or pharmacy?: 1 - Never  Interpreter Needed?: No     Past Medical History:  Diagnosis Date  . Arthritis   . Chicken pox   . Endometriosis   . Esophagitis    s/p esophageal dilatation  . GERD (gastroesophageal reflux disease)   . History of ovarian cyst   . Hypercholesterolemia   . Hypertension   . Hypothyroidism    Past Surgical History:  Procedure Laterality Date  . ABDOMINAL SURGERY     found to have an ovarian cyst and endometriosis  . APPENDECTOMY      Family History  Problem Relation Age of Onset  . Lung disease Father   . Heart attack Father   . Heart disease Mother        myocardial infarction  . Breast cancer Mother 28  . Heart attack Mother   . Hypertension Mother   . Hyperlipidemia Mother   . Breast cancer Sister 8  . Hypertension Sister   . Rheum arthritis Sister   . Breast cancer Maternal Aunt 65  . Hypertension Brother   . Colon cancer Neg Hx    Social History   Socioeconomic History  . Marital status: Married    Spouse name: Not on file  . Number of children: Not on file  . Years of education: Not on file  . Highest education level: Not on file  Occupational History  . Not on file  Social Needs  . Financial resource strain: Not hard at all  . Food insecurity:    Worry: Never true    Inability: Never true  . Transportation needs:    Medical: No    Non-medical: No  Tobacco Use  . Smoking status: Never Smoker  . Smokeless tobacco: Never Used  Substance and Sexual Activity  . Alcohol use: No    Alcohol/week: 0.0 standard drinks  . Drug use: No  . Sexual activity: Never  Lifestyle  . Physical activity:    Days per week: 5 days  Minutes per session: 30 min  . Stress: Not at all  Relationships  . Social connections:    Talks on phone: Not on file    Gets together: Not on file    Attends religious service: Not on file    Active member of club or organization: Not on file    Attends meetings of clubs or organizations: Not on file    Relationship status: Not on file  Other Topics Concern  . Not on file  Social History Narrative  . Not on file    Outpatient Encounter Medications as of 03/03/2019  Medication Sig  . amLODipine (NORVASC) 5 MG tablet TAKE 1 TABLET DAILY  . carvedilol (COREG) 6.25 MG tablet TAKE 1 TABLET TWICE A DAY (NEED APPOINTMENT FOR FURTHER REFILLS)  . cefdinir (OMNICEF) 300 MG capsule Take 1 capsule (300 mg total) by mouth 2 (two) times daily.  Marland Kitchen levothyroxine (SYNTHROID,  LEVOTHROID) 88 MCG tablet TAKE 1 TABLET DAILY  . lisinopril (PRINIVIL,ZESTRIL) 40 MG tablet Take 1 tablet (40 mg total) by mouth daily.  Marland Kitchen omeprazole (PRILOSEC) 20 MG capsule TAKE 1 CAPSULE DAILY  . simvastatin (ZOCOR) 10 MG tablet TAKE 1 TABLET AT BEDTIME  . spironolactone (ALDACTONE) 25 MG tablet TAKE 1 TABLET DAILY   No facility-administered encounter medications on file as of 03/03/2019.     Activities of Daily Living In your present state of health, do you have any difficulty performing the following activities: 03/03/2019  Hearing? N  Vision? N  Difficulty concentrating or making decisions? N  Walking or climbing stairs? N  Dressing or bathing? N  Doing errands, shopping? N  Preparing Food and eating ? N  Using the Toilet? N  In the past six months, have you accidently leaked urine? N  Do you have problems with loss of bowel control? N  Managing your Medications? N  Managing your Finances? N  Housekeeping or managing your Housekeeping? N  Some recent data might be hidden    Patient Care Team: Einar Pheasant, MD as PCP - General (Internal Medicine)    Assessment:   This is a routine wellness examination for [place].  I connected with patient 03/03/19 at 11:00 AM EDT by a video/audio enabled telemedicine application and verified that I am speaking with the correct person using two identifiers. Patient stated full name and DOB. Patient gave permission to continue with virtual visit. Patient's location was at home and Nurse's location was at [place] office.   Health Screenings  Mammogram - 01/2018 Colonoscopy - 08/2012 Bone Density - 12/2013 Glaucoma -none Hearing -demonstrates normal hearing during visit. Labs followed by pcp Dental- UTD Vision- visits within the last 12 months.  Social  Alcohol intake - no          Smoking history- never    Smokers in home? none Illicit drug use? none Exercise - walking daily, 30 minutes Diet - vegetable, fruits, yogurt Sexually  Active -not currently BMI- discussed the importance of a healthy diet, water intake and the benefits of aerobic exercise.  Educational material provided.   Safety  Patient feels safe at home- yes Patient does have smoke detectors at home- yes Patient does wear sunscreen or protective clothing when in direct sunlight -yes Patient does wear seat belt when in a moving vehicle -yes  Covid-19 precautions and sickness symptoms discussed.   Activities of Daily Living Patient denies needing assistance with: driving, household chores, feeding themselves, getting from bed to chair, getting to the toilet, bathing/showering, dressing,  managing money, or preparing meals.  No new identified risk were noted.    Depression Screen Patient denies losing interest in daily life, feeling hopeless, or crying easily over simple problems.   Medication-taking as directed and without issues.   Fall Screen Patient denies being afraid of falling or falling in the last year.   Memory Screen Patient is alert.  Patient denies difficulty focusing, concentrating or misplacing items. Correctly identified the president of the [place], season and recall. Patient likes to read for brain stimulation.  Immunizations The following Immunizations were discussed: Influenza, shingles, pneumonia, and tetanus.   Other Providers Patient Care Team: Einar Pheasant, MD as PCP - General (Internal Medicine)   Exercise Activities and Dietary recommendations Current Exercise Habits: Home exercise routine, Type of exercise: walking, Time (Minutes): 30, Frequency (Times/Week): 5, Weekly Exercise (Minutes/Week): 150, Intensity: Mild  Goals    . Maintain [place] Lifestyle     Stay active Stay hydrated  Healthy diet       Fall Risk Fall Risk  03/03/2019 04/19/2018 03/01/2018 02/28/2017 07/12/2016  Falls in the past year? 0 No No No No   Depression Screen PHQ 2/9 Scores 03/03/2019 04/19/2018 03/01/2018 02/28/2017  PHQ - 2 Score 0 0 0  0  PHQ- 9 Score - - - 0     Cognitive Function MMSE - Mini Mental State Exam 03/01/2018 02/28/2017  Orientation to time 5 5  Orientation to Place 5 5  Registration 3 3  Attention/ Calculation 5 5  Recall 3 3  Language- name 2 objects 2 2  Language- repeat 1 1  Language- follow 3 step command 3 3  Language- read & follow direction 1 1  Write a sentence 1 1  Copy design 1 1  Total score 30 30     6CIT Screen 03/03/2019  What Year? 0 points  What month? 0 points  What time? 0 points  Count back from 20 0 points  Months in reverse 0 points  Repeat phrase 0 points  Total Score 0    Immunization History  Administered Date(s) Administered  . Influenza, High Dose Seasonal PF 07/10/2017, 07/01/2018  . Influenza-Unspecified 06/23/2013, 06/29/2014, 07/12/2015, 07/04/2016  . Pneumococcal Conjugate-13 08/04/2014  . Pneumococcal Polysaccharide-23 02/28/2017   Screening Tests Health Maintenance  Topic Date Due  . TETANUS/TDAP  12/10/1958  . MAMMOGRAM  02/14/2019  . INFLUENZA VACCINE  05/24/2019  . DEXA SCAN  Completed  . PNA vac Low Risk Adult  Completed      Plan:    End of life planning; Advance aging; Advanced directives discussed.  Copy of current HCPOA/Living Will requested.    I have personally reviewed and noted the following in the patient's chart:   . Medical and social history . Use of alcohol, tobacco or illicit drugs  . Current medications and supplements . Functional ability and status . Nutritional status . Physical activity . Advanced directives . List of other physicians . Hospitalizations, surgeries, and ER visits in previous 12 months . Vitals . Screenings to include cognitive, depression, and falls . Referrals and appointments  In addition, I have reviewed and discussed with patient certain preventive protocols, quality metrics, and best practice recommendations. A written personalized care plan for preventive services as well as general preventive  health recommendations were provided to patient.     Varney Biles, LPN  05/17/2034   Reviewed above information.  Agree with assessment and plan.    Dr Nicki Reaper

## 2019-03-03 NOTE — Progress Notes (Signed)
Patient ID: Crystal Lynch, female   DOB: 06/16/1941, 78 y.o.   MRN: [id number]   Virtual Visit via video Note  This visit type was conducted due to national recommendations for restrictions regarding the COVID-19 pandemic (e.g. social distancing).  This format is felt to be most appropriate for this patient at this time.  All issues noted in this document were discussed and addressed.  No physical exam was performed (except for noted visual exam findings with Video Visits).   I connected with Crystal Lynch by a video enabled telemedicine application or telephone and verified that I am speaking with the correct person using two identifiers. Location patient: home Location provider: work  Persons participating in the virtual visit: patient, provider  I discussed the limitations, risks, security and privacy concerns of performing an evaluation and management service by video and the availability of in person appointments.. The patient expressed understanding and agreed to proceed.   Reason for visit: scheduled follow up appt  HPI: She reports she is doing relatively well.  Trying to stay in - due to COVID restrictions.  No fever.  No chest congestion or sob.  Had UTI 12/24/18.  Treated.  No symptoms now.  Wants to recheck urine to confirm clear.  States she has noticed it is harder for her to hold her urine at times.  Discussed kegel exercises.  Also discussed bladder therapy.  She wants to hold at this time.  No chest pain.  Trying to stay active.  No sob.  No acid reflux.  No abdominal pain.  Bowels moving.  Taking zocor.  States her blood pressure has been doing relatively well.  Handling stress.     ROS: See pertinent positives and negatives per HPI.  Past Medical History:  Diagnosis Date  . Arthritis   . Chicken pox   . Endometriosis   . Esophagitis    s/p esophageal dilatation  . GERD (gastroesophageal reflux disease)   . History of ovarian cyst   . Hypercholesterolemia   .  Hypertension   . Hypothyroidism     Past Surgical History:  Procedure Laterality Date  . ABDOMINAL SURGERY     found to have an ovarian cyst and endometriosis  . APPENDECTOMY      Family History  Problem Relation Age of Onset  . Lung disease Father   . Heart attack Father   . Heart disease Mother        myocardial infarction  . Breast cancer Mother 36  . Heart attack Mother   . Hypertension Mother   . Hyperlipidemia Mother   . Breast cancer Sister 61  . Hypertension Sister   . Rheum arthritis Sister   . Breast cancer Maternal Aunt 67  . Hypertension Brother   . Colon cancer Neg Hx     SOCIAL HX: reviewed.    Current Outpatient Medications:  .  amLODipine (NORVASC) 5 MG tablet, TAKE 1 TABLET DAILY, Disp: 90 tablet, Rfl: 3 .  carvedilol (COREG) 6.25 MG tablet, TAKE 1 TABLET TWICE A DAY (NEED APPOINTMENT FOR FURTHER REFILLS), Disp: 180 tablet, Rfl: 2 .  cefdinir (OMNICEF) 300 MG capsule, Take 1 capsule (300 mg total) by mouth 2 (two) times daily., Disp: 14 capsule, Rfl: 0 .  levothyroxine (SYNTHROID, LEVOTHROID) 88 MCG tablet, TAKE 1 TABLET DAILY, Disp: 90 tablet, Rfl: 4 .  lisinopril (PRINIVIL,ZESTRIL) 40 MG tablet, Take 1 tablet (40 mg total) by mouth daily., Disp: 180 tablet, Rfl: 3 .  omeprazole (PRILOSEC) 20  MG capsule, TAKE 1 CAPSULE DAILY, Disp: 90 capsule, Rfl: 4 .  simvastatin (ZOCOR) 10 MG tablet, TAKE 1 TABLET AT BEDTIME, Disp: 90 tablet, Rfl: 4 .  spironolactone (ALDACTONE) 25 MG tablet, TAKE 1 TABLET DAILY, Disp: 90 tablet, Rfl: 3  EXAM:  VITALS per patient if applicable: 924/46, pulse 63  GENERAL: alert, oriented, appears well and in no acute distress  HEENT: atraumatic, conjunttiva clear, no obvious abnormalities on inspection of external nose and ears  NECK: normal movements of the head and neck  LUNGS: on inspection no signs of respiratory distress, breathing rate appears normal, no obvious gross SOB, gasping or wheezing  CV: no obvious cyanosis   PSYCH/NEURO: pleasant and cooperative, no obvious depression or anxiety, speech and thought processing grossly intact  ASSESSMENT AND PLAN:  Discussed the following assessment and plan:  Esophagitis  Frequent urinary tract infections - Plan: Urinalysis, Routine w reflex microscopic, Urine Culture  Hypercholesterolemia - Plan: Hepatic function panel, Lipid panel  Essential hypertension - Plan: CBC with Differential/Platelet, Basic metabolic panel  Hypothyroidism, unspecified type  Esophagitis No upper symptoms reported.  Taking omeprazole.    Frequent urinary tract infections Recently treated.  Having trouble "holding her urine".  Recheck urine to confirm clear.  Declines bladder therapy at this time.  Discussed kegel exercises.    Hypercholesterolemia Low cholesterol diet and exercise.  Follow lipid panel and liver function tests.  On simvastatin.   Hypertension Blood pressure has been under reasonable control.  Follow pressures.  Follow metabolic panel. Continue current medications.    Hypothyroidism On thyroid replacement.  Follow tsh.     I discussed the assessment and treatment plan with the patient. The patient was provided an opportunity to ask questions and all were answered. The patient agreed with the plan and demonstrated an understanding of the instructions.   The patient was advised to call back or seek an in-person evaluation if the symptoms worsen or if the condition fails to improve as anticipated.    Einar Pheasant, MD

## 2019-03-03 NOTE — Patient Instructions (Addendum)
  Ms. Fetters , Thank you for taking time to come for your Medicare Wellness Visit. I appreciate your ongoing commitment to your health goals. Please review the following plan we discussed and let me know if I can assist you in the future.   These are the goals we discussed: Goals    . Maintain [place] Lifestyle     Stay active Stay hydrated  Healthy diet       This is a list of the screening recommended for you and due dates:  Health Maintenance  Topic Date Due  . Tetanus Vaccine  12/10/1958  . Mammogram  02/14/2019  . Flu Shot  05/24/2019  . DEXA scan (bone density measurement)  Completed  . Pneumonia vaccines  Completed

## 2019-03-06 ENCOUNTER — Encounter: Payer: Self-pay | Admitting: Internal Medicine

## 2019-03-06 ENCOUNTER — Telehealth: Payer: Self-pay

## 2019-03-06 NOTE — Assessment & Plan Note (Signed)
Recently treated.  Having trouble "holding her urine".  Recheck urine to confirm clear.  Declines bladder therapy at this time.  Discussed kegel exercises.

## 2019-03-06 NOTE — Assessment & Plan Note (Signed)
No upper symptoms reported.  Taking omeprazole.

## 2019-03-06 NOTE — Telephone Encounter (Signed)
Copied from [place] [id number]. Topic: Appointment Scheduling - Scheduling Inquiry for Clinic >> Mar 06, 2019  9:17 AM Mathis Bud wrote: Reason for CRM: patient called stating PCP nurse was suppose to call her to schedule labs.  She saw PCP and O'Brien-Blaney, Denisa L, LPN for her medicare well visit on Monday 5/11.  Patient call back # [phone]

## 2019-03-06 NOTE — Assessment & Plan Note (Signed)
Blood pressure has been under reasonable control.  Follow pressures.  Follow metabolic panel. Continue current medications.

## 2019-03-06 NOTE — Assessment & Plan Note (Signed)
On thyroid replacement.  Follow tsh.  

## 2019-03-06 NOTE — Telephone Encounter (Signed)
Ok to schedule lab appt.  See my check out box.  Thanks

## 2019-03-06 NOTE — Assessment & Plan Note (Signed)
Low cholesterol diet and exercise.  Follow lipid panel and liver function tests.  On simvastatin.   

## 2019-03-12 ENCOUNTER — Ambulatory Visit
Admission: RE | Admit: 2019-03-12 | Discharge: 2019-03-12 | Disposition: A | Discharge status: Home / Self Care | Payer: Medicare Other | Source: Ambulatory Visit | Attending: Internal Medicine | Admitting: Internal Medicine

## 2019-03-12 ENCOUNTER — Other Ambulatory Visit: Payer: Self-pay

## 2019-03-12 DIAGNOSIS — Z1231 Encounter for screening mammogram for malignant neoplasm of breast: Secondary | ICD-10-CM | POA: Diagnosis present

## 2019-03-19 ENCOUNTER — Other Ambulatory Visit (INDEPENDENT_AMBULATORY_CARE_PROVIDER_SITE_OTHER): Payer: Medicare Other

## 2019-03-19 ENCOUNTER — Other Ambulatory Visit: Payer: Self-pay

## 2019-03-19 DIAGNOSIS — E7800 Pure hypercholesterolemia, unspecified: Secondary | ICD-10-CM

## 2019-03-19 DIAGNOSIS — I10 Essential (primary) hypertension: Secondary | ICD-10-CM

## 2019-03-19 DIAGNOSIS — N390 Urinary tract infection, site not specified: Secondary | ICD-10-CM

## 2019-03-19 LAB — CBC WITH DIFFERENTIAL/PLATELET
Basophils Absolute: 0 10*3/uL (ref 0.0–0.1)
Basophils Relative: 0.6 % (ref 0.0–3.0)
Eosinophils Absolute: 0.1 10*3/uL (ref 0.0–0.7)
Eosinophils Relative: 2 % (ref 0.0–5.0)
HCT: 40.4 % (ref 36.0–46.0)
Hemoglobin: 13.8 g/dL (ref 12.0–15.0)
Lymphocytes Relative: 41.3 % (ref 12.0–46.0)
Lymphs Abs: 2.2 10*3/uL (ref 0.7–4.0)
MCHC: 34.2 g/dL (ref 30.0–36.0)
MCV: 91.6 fl (ref 78.0–100.0)
Monocytes Absolute: 0.5 10*3/uL (ref 0.1–1.0)
Monocytes Relative: 9.5 % (ref 3.0–12.0)
Neutro Abs: 2.5 10*3/uL (ref 1.4–7.7)
Neutrophils Relative %: 46.6 % (ref 43.0–77.0)
Platelets: 257 10*3/uL (ref 150.0–400.0)
RBC: 4.42 Mil/uL (ref 3.87–5.11)
RDW: 13.6 % (ref 11.5–15.5)
WBC: 5.4 10*3/uL (ref 4.0–10.5)

## 2019-03-19 LAB — URINALYSIS, ROUTINE W REFLEX MICROSCOPIC
Bilirubin Urine: NEGATIVE
Ketones, ur: NEGATIVE
Nitrite: POSITIVE — AB
Specific Gravity, Urine: 1.02 (ref 1.000–1.030)
Total Protein, Urine: NEGATIVE
Urine Glucose: NEGATIVE
Urobilinogen, UA: 0.2 (ref 0.0–1.0)
pH: 5 (ref 5.0–8.0)

## 2019-03-19 LAB — LIPID PANEL
Cholesterol: 161 mg/dL (ref 0–200)
HDL: 41.7 mg/dL (ref >39.00)
LDL Cholesterol: 92 mg/dL (ref 0–99)
NonHDL: 119.41
Total CHOL/HDL Ratio: 4
Triglycerides: 135 mg/dL (ref 0.0–149.0)
VLDL: 27 mg/dL (ref 0.0–40.0)

## 2019-03-19 LAB — HEPATIC FUNCTION PANEL
ALT: 15 U/L (ref 0–35)
AST: 15 U/L (ref 0–37)
Albumin: 4.3 g/dL (ref 3.5–5.2)
Alkaline Phosphatase: 74 U/L (ref 39–117)
Bilirubin, Direct: 0.2 mg/dL (ref 0.0–0.3)
Total Bilirubin: 1.2 mg/dL (ref 0.2–1.2)
Total Protein: 6.6 g/dL (ref 6.0–8.3)

## 2019-03-19 LAB — BASIC METABOLIC PANEL
BUN: 20 mg/dL (ref 6–23)
CO2: 24 mEq/L (ref 19–32)
Calcium: 10.1 mg/dL (ref 8.4–10.5)
Chloride: 104 mEq/L (ref 96–112)
Creatinine, Ser: 0.83 mg/dL (ref 0.40–1.20)
GFR: 66.27 mL/min (ref >60.00)
Glucose, Bld: 96 mg/dL (ref 70–99)
Potassium: 4.1 mEq/L (ref 3.5–5.1)
Sodium: 137 mEq/L (ref 135–145)

## 2019-03-19 NOTE — Addendum Note (Signed)
Addended by: Leeanne Rio on: 03/19/2019 09:19 AM   Modules accepted: Orders

## 2019-03-19 NOTE — Addendum Note (Signed)
Addended by: Leeanne Rio on: 03/19/2019 10:59 AM   Modules accepted: Orders

## 2019-03-21 LAB — URINE CULTURE
MICRO NUMBER:: 509883
SPECIMEN QUALITY:: ADEQUATE

## 2019-03-25 ENCOUNTER — Other Ambulatory Visit: Payer: Self-pay | Admitting: Internal Medicine

## 2019-03-25 ENCOUNTER — Other Ambulatory Visit: Payer: Self-pay

## 2019-03-25 MED ORDER — CEFDINIR 300 MG PO CAPS: 300.0000 mg | ORAL_CAPSULE | Freq: Two times a day (BID) | ORAL | 0 refills | Status: DC

## 2019-03-25 NOTE — Progress Notes (Signed)
rx sent in for omnicef 

## 2019-05-06 ENCOUNTER — Encounter: Payer: Self-pay | Admitting: Urology

## 2019-05-06 ENCOUNTER — Other Ambulatory Visit: Payer: Self-pay

## 2019-05-06 ENCOUNTER — Ambulatory Visit (INDEPENDENT_AMBULATORY_CARE_PROVIDER_SITE_OTHER): Payer: Medicare Other | Admitting: Urology

## 2019-05-06 VITALS — BP 108/67 | HR 66 | Ht 62.0 in | Wt 133.0 lb

## 2019-05-06 DIAGNOSIS — N390 Urinary tract infection, site not specified: Secondary | ICD-10-CM | POA: Diagnosis not present

## 2019-05-06 DIAGNOSIS — R319 Hematuria, unspecified: Secondary | ICD-10-CM

## 2019-05-06 DIAGNOSIS — R350 Frequency of micturition: Secondary | ICD-10-CM | POA: Diagnosis not present

## 2019-05-06 LAB — MICROSCOPIC EXAMINATION
Bacteria, UA: NONE SEEN
WBC, UA: NONE SEEN /hpf (ref 0–5)

## 2019-05-06 LAB — URINALYSIS, COMPLETE
Bilirubin, UA: NEGATIVE
Glucose, UA: NEGATIVE
Ketones, UA: NEGATIVE
Leukocytes,UA: NEGATIVE
Nitrite, UA: NEGATIVE
Protein,UA: NEGATIVE
Specific Gravity, UA: 1.01 (ref 1.005–1.030)
Urobilinogen, Ur: 0.2 mg/dL (ref 0.2–1.0)
pH, UA: 5 (ref 5.0–7.5)

## 2019-05-06 LAB — BLADDER SCAN AMB NON-IMAGING

## 2019-05-06 NOTE — Progress Notes (Signed)
05/06/2019 4:28 PM   Crystal Lynch 1940-12-26 [id number]  Referring provider: Einar Pheasant, [address] [place],  [place] [address]  Chief Complaint  Patient presents with  . Hematuria    New Patient    HPI: 78 year old female referred for further evaluation of recurrent urinary tract infections.  Notably, the patient has had multiple E. coli urinary tract infections since 09/2018.  This is resistant to fluoroquinolones, Bactrim and ampicillin.  On some occasions, she was symptomatic with urgency frequency and dysuria.  On other occasions, her urine was checked for routine follow-up to ensure that the infection had cleared.  On one occasion, her only symptom was fever to 101 and malaise.  She did have a CT abdomen pelvis with contrast in 04/2018 for further evaluation of abdominal pain and night sweats.  This indicated left-sided parapelvic cyst, but otherwise no hydronephrosis or stones.  Her kidneys were otherwise unremarkable.  Today, patient denies dysuria and gross hematuria.  She states that she recalls having approximately 1-2 urinary tract infections annually over the past several years.  She states she is unaware of having for positive urine cultures this year.  She reports that typically her urinary tract infections are accompanied by lumbar pain.    She does not have a personal history of cancer.  She is not sexually active.  She is status post hysterectomy for benign causes remotely.  She denies any vaginal pain, bulging, pressure or discomfort.  For the most part, she has regular bowel movements.  PMH: Past Medical History:  Diagnosis Date  . Arthritis   . Chicken pox   . Endometriosis   . Esophagitis    s/p esophageal dilatation  . GERD (gastroesophageal reflux disease)   . History of ovarian cyst   . Hypercholesterolemia   . Hypertension   . Hypothyroidism     Surgical History: Past Surgical History:  Procedure  Laterality Date  . ABDOMINAL SURGERY     found to have an ovarian cyst and endometriosis  . APPENDECTOMY      Home Medications:  Allergies as of 05/06/2019      Reactions   Meloxicam    BP issues      Medication List       Accurate as of May 06, 2019  4:28 PM. If you have any questions, ask your nurse or doctor.        amLODipine 5 MG tablet Commonly known as: NORVASC TAKE 1 TABLET DAILY   carvedilol 6.25 MG tablet Commonly known as: COREG TAKE 1 TABLET TWICE A DAY (NEED APPOINTMENT FOR FURTHER REFILLS)   cefdinir 300 MG capsule Commonly known as: OMNICEF Take 1 capsule (300 mg total) by mouth 2 (two) times daily.   levothyroxine 88 MCG tablet Commonly known as: SYNTHROID TAKE 1 TABLET DAILY   lisinopril 40 MG tablet Commonly known as: ZESTRIL Take 1 tablet (40 mg total) by mouth daily.   omeprazole 20 MG capsule Commonly known as: PRILOSEC TAKE 1 CAPSULE DAILY   simvastatin 10 MG tablet Commonly known as: ZOCOR TAKE 1 TABLET AT BEDTIME   spironolactone 25 MG tablet Commonly known as: ALDACTONE TAKE 1 TABLET DAILY       Allergies:  Allergies  Allergen Reactions  . Meloxicam     BP issues    Family History: Family History  Problem Relation Age of Onset  . Lung disease Father   . Heart attack Father   . Heart disease Mother  myocardial infarction  . Breast cancer Mother 18  . Heart attack Mother   . Hypertension Mother   . Hyperlipidemia Mother   . Breast cancer Sister 6  . Hypertension Sister   . Rheum arthritis Sister   . Breast cancer Maternal Aunt 65  . Hypertension Brother   . Colon cancer Neg Hx     Social History:  reports that she has never smoked. She has never used smokeless tobacco. She reports that she does not drink alcohol or use drugs.  ROS: UROLOGY Frequent Urination?: No Hard to postpone urination?: No Burning/pain with urination?: No Get up at night to urinate?: Yes Leakage of urine?: No Urine stream  starts and stops?: Yes Trouble starting stream?: No Do you have to strain to urinate?: No Blood in urine?: No Urinary tract infection?: Yes Sexually transmitted disease?: No Injury to kidneys or bladder?: No Painful intercourse?: No Weak stream?: No Currently pregnant?: No Vaginal bleeding?: No Last menstrual period?: n  Gastrointestinal Nausea?: No Vomiting?: No Indigestion/heartburn?: No Diarrhea?: No Constipation?: No  Constitutional Fever: No Night sweats?: No Weight loss?: No Fatigue?: No  Skin Skin rash/lesions?: No Itching?: No  Eyes Blurred vision?: No Double vision?: No  Ears/Nose/Throat Sore throat?: No Sinus problems?: No  Hematologic/Lymphatic Swollen glands?: No Easy bruising?: No  Cardiovascular Leg swelling?: No Chest pain?: No  Respiratory Cough?: No Shortness of breath?: No  Endocrine Excessive thirst?: No  Musculoskeletal Back pain?: Yes Joint pain?: No  Neurological Headaches?: No Dizziness?: No  Psychologic Depression?: No Anxiety?: No  Physical Exam: BP 108/67   Pulse 66   Ht 5\' 2"  (1.575 m)   Wt 133 lb (60.3 kg)   LMP 10/21/1984   BMI 24.33 kg/m   Constitutional:  Alert and oriented, No acute distress. HEENT:  AT, moist mucus membranes.  Trachea midline, no masses. Cardiovascular: No clubbing, cyanosis, or edema. Respiratory: Normal respiratory effort, no increased work of breathing. Pelvic: Normal external genitalia.  Mildly hypermobile urethra meatus without masses or lesions.  Mildly atrophic vaginal tissue appreciated.  Small grade 1 cystorectocele, no cystocele.  Good apical support. Skin: No rashes, bruises or suspicious lesions. Neurologic: Grossly intact, no focal deficits, moving all 4 extremities. Psychiatric: Normal mood and affect.  Laboratory Data: Lab Results  Component Value Date   WBC 5.4 03/19/2019   HGB 13.8 03/19/2019   HCT 40.4 03/19/2019   MCV 91.6 03/19/2019   PLT 257.0 03/19/2019     Lab Results  Component Value Date   CREATININE 0.83 03/19/2019    Urinalysis Results for orders placed or performed in visit on 05/06/19  Microscopic Examination   URINE  Result Value Ref Range   WBC, UA None seen 0 - 5 /hpf   RBC 0-2 0 - 2 /hpf   Epithelial Cells (non renal) 0-10 0 - 10 /hpf   Bacteria, UA None seen None seen/Few  Urinalysis, Complete  Result Value Ref Range   Specific Gravity, UA 1.010 1.005 - 1.030   pH, UA 5.0 5.0 - 7.5   Color, UA Yellow Yellow   Appearance Ur Hazy (A) Clear   Leukocytes,UA Negative Negative   Protein,UA Negative Negative/Trace   Glucose, UA Negative Negative   Ketones, UA Negative Negative   RBC, UA 2+ (A) Negative   Bilirubin, UA Negative Negative   Urobilinogen, Ur 0.2 0.2 - 1.0 mg/dL   Nitrite, UA Negative Negative   Microscopic Examination See below:   BLADDER SCAN AMB NON-IMAGING  Result Value Ref Range  Scan Result 56ml      Assessment & Plan:    1.  Recurrent urinary tract infection Patient's in office urinalysis today is unconcerning for infection.  I am not concerned for urinary colonization.  Rather, I do believe patient is experiencing recurrent urinary tract infections.  Recent upper tract imaging relatively normal.  Counseled patient that postmenopausal women are prone to urinary tract infections due to hormonal changes in the genital tissue.  Explained that approximately 1 urinary tract infection per year is typical for women in her age group.    Discussed the role of dietary supplements including cranberry pills and probiotics in preventing future urinary tract infections.  Counseled the patient that she is not a candidate for prophylactic antibiotics at this time, due both to the low relative frequency of her urinary tract infections and to the increasing resistance of the bacteria grown on culture.    Counseled patient that long-term use of antibiotics at this point may create more problems with bacterial  resistance in the future, leading to worse outcomes and subsequent infections.    Patient expressed understanding of this and stated a desire to start on cranberry supplements and probiotics as advised.  Furthermore, counseled the patient to not undergo treatment for urinary tract infections when she does not have symptoms including dysuria, urinary frequency, and urgency, as there is no clinical benefit in treating asymptomatic bacteriuria at this time.    Counseled patient on the possible future role of topical estrogen cream.  Patient wishes to defer this treatment at this time, and I agree with this plan.  We may revisit this option in the future if her urinary tract infections persist. - Urinalysis, Complete   Hollice Espy, MD  [facility] [address] [place], [place] [address] [phone]

## 2019-05-28 ENCOUNTER — Telehealth: Payer: Self-pay | Admitting: Cardiovascular Disease

## 2019-05-28 NOTE — Telephone Encounter (Signed)
Pt c/o BP issue: STAT if pt c/o blurred vision, one-sided weakness or slurred speech  1. What are your last 5 BP readings? 104/63       89/50 TODAY   2. Are you having any other symptoms (ex. Dizziness, headache, blurred vision, passed out)? When low feels light headed   3. What is your BP issue? Patient states she takes several meds for htn and is concerned about low trend

## 2019-05-28 NOTE — Telephone Encounter (Signed)
Stop amlodipine and continue other medications.  Continue to monitor blood pressure.

## 2019-05-28 NOTE — Telephone Encounter (Signed)
Pt made aware of Dr.Arida's recommendation. Pt verbalized understanding.

## 2019-05-28 NOTE — Telephone Encounter (Signed)
Spoke with the pt. Pt sts that she monitors her BP daily. Over the weekend she started to record low BP readings associated with some dizziness. Pt monitors her BP 2-3 hours after taking her medications. Pt is currently taking: Carvedilol 6.25mg  bid Lisinopril 40mg  daily Amlodipine 5mg  daily Spironolactone 25mg  daily. BP yesterday 104/63 BP this morning 89/50 67bpm (pt sts that she did have some dizziness)  I asked the pt to recheck her BP while I held on the phone. BP 103/59 68bpm. Pt is currently asymptomatic. She is wondering if her BP medications need to be adjusted.  I adv her to break up how she takes her medication and not take all 4 BP meds at once. Adv her to take her Carvedilol and Spironolactone in the morning, take her Lisinopril and Amlodipine at lunch, and her 2nd dose of Carvedilol at dinnertime. I encouraged her to stay hydrated and continue to monitor her BP daily.  Adv the pt that I will fwd the update to [place] and call back if he has additional recommendations. Pt adv to contact the office in the interim if her BP continues to run low. Pt agreeable with the plan and voiced appreciation for the call back.

## 2019-06-02 ENCOUNTER — Telehealth: Payer: Self-pay | Admitting: Cardiovascular Disease

## 2019-06-02 MED ORDER — LISINOPRIL 40 MG PO TABS: 20.0000 mg | ORAL_TABLET | Freq: Every day | ORAL | 3 refills | Status: DC

## 2019-06-02 NOTE — Telephone Encounter (Signed)
Patient verbalized understanding to decrease lisinopril to 20 mg once a day. She will split her lisinopril 40 mg tablet in half. She is aware to take BP about 2 hours after her morning medications. She will continue to monitor and let us know if this does not help.

## 2019-06-02 NOTE — Telephone Encounter (Signed)
Patient still having low BP. Stopped amlodipine last week, last day was 8/5. She is having lower readings and feels lightheaded with the lower readings. BP/HR readings since then are as follows: 8/6 11 am  104/60,  HR 66  (after 3 AM meds) 8/7 1 pm 112/62,  HR 68 8/9 8 am 94/49,    HR 70 (before AM meds, lightheaded)  2 pm    112/59 8/10 7 am 138/67 (before meds)  1030 a  98/55  (lightheaded)  She says she making sure to drink plenty of water.  Feels like she's less active than usual because they don't go out much. Med list verified. Routing to Dr Fletcher Anon for further advice.

## 2019-06-02 NOTE — Telephone Encounter (Signed)
Decrease lisinopril to 20 mg daily

## 2019-06-02 NOTE — Telephone Encounter (Signed)
Pt c/o BP issue: STAT if pt c/o blurred vision, one-sided weakness or slurred speech  1. What are your last 5 BP readings?          94/49        95/49                121/60         Today 138/67  After meds 98/55    2. Are you having any other symptoms (ex. Dizziness, headache, blurred vision, passed out)? Sleepy light headed unsteady a little   3. What is your BP issue? Patient wants to know if too much medicine or needs to try taking at a different time.  She feels like bp trend is too low after morning meds

## 2019-06-06 ENCOUNTER — Telehealth: Payer: Self-pay | Admitting: Cardiovascular Disease

## 2019-06-06 NOTE — Telephone Encounter (Signed)
Continue same medications for now.

## 2019-06-06 NOTE — Telephone Encounter (Signed)
I called and spoke with the patient. Crystal Lynch is aware of Dr. Tyrell Antonio recommendations and voices understanding.

## 2019-06-06 NOTE — Telephone Encounter (Signed)
Pt c/o BP issue: STAT if pt c/o blurred vision, one-sided weakness or slurred speech  1. What are your last 5 BP readings? 91/49 this morning at 10 after medications         2. Are you having any other symptoms (ex. Dizziness, headache, blurred vision, passed out)? Dizzy   3. What is your BP issue? Still having significant number drop in bp even after cutting lisinopril dose.  Please call to advise further

## 2019-06-06 NOTE — Telephone Encounter (Signed)
I spoke with the patient. She states she has been seeing fluctuations in her BP readings and having dizziness associated with this.   She state Dr. Fletcher Anon stopped her amlodipine- this was done on 8/5/220. She called back on 8/10 with BP readings and was advised to cut lisinopril to 20 mg once daily.  She confirms she is also taking: - spironolactone 25 mg once daily - coreg 6.25 mg BID  She has taking BP readings before her meds and then 3 hours later.   Mon/ Tues: Before meds- 117/66   10 am- 107/59 Wed:          Before meds- 142/73   10 am- 102/57 Thurs:        Before meds- 132/67   10 am- 97/57 Today:        Before meds- 139/67   10 am- 91/49  She did report one 2 pm reading of 112/59.  HR's have been in the 60's.  I have advised her I will review further with Dr. Fletcher Anon this afternoon and we will call her back with further recommendations.  The patient voices understanding and is agreeable.

## 2019-06-20 ENCOUNTER — Other Ambulatory Visit: Payer: Self-pay | Admitting: Cardiovascular Disease

## 2019-07-08 DIAGNOSIS — M7062 Trochanteric bursitis, left hip: Secondary | ICD-10-CM | POA: Insufficient documentation

## 2019-07-23 ENCOUNTER — Other Ambulatory Visit: Payer: Self-pay

## 2019-07-25 ENCOUNTER — Other Ambulatory Visit: Payer: Self-pay

## 2019-07-25 ENCOUNTER — Other Ambulatory Visit: Payer: Self-pay | Admitting: Cardiovascular Disease

## 2019-07-25 ENCOUNTER — Ambulatory Visit (INDEPENDENT_AMBULATORY_CARE_PROVIDER_SITE_OTHER): Payer: Medicare Other | Admitting: Internal Medicine

## 2019-07-25 ENCOUNTER — Encounter: Payer: Self-pay | Admitting: Internal Medicine

## 2019-07-25 VITALS — BP 116/64 | HR 62 | Temp 96.6°F | Ht 62.0 in | Wt 132.8 lb

## 2019-07-25 DIAGNOSIS — Z0000 Encounter for general adult medical examination without abnormal findings: Secondary | ICD-10-CM | POA: Diagnosis not present

## 2019-07-25 DIAGNOSIS — I10 Essential (primary) hypertension: Secondary | ICD-10-CM

## 2019-07-25 DIAGNOSIS — R079 Chest pain, unspecified: Secondary | ICD-10-CM | POA: Insufficient documentation

## 2019-07-25 DIAGNOSIS — K209 Esophagitis, unspecified without bleeding: Secondary | ICD-10-CM | POA: Diagnosis not present

## 2019-07-25 DIAGNOSIS — E7800 Pure hypercholesterolemia, unspecified: Secondary | ICD-10-CM

## 2019-07-25 DIAGNOSIS — E039 Hypothyroidism, unspecified: Secondary | ICD-10-CM

## 2019-07-25 NOTE — Patient Instructions (Signed)
Increase omeprazole to twice a day

## 2019-07-25 NOTE — Progress Notes (Signed)
Patient ID: Crystal Lynch, female   DOB: 1941-04-12, 78 y.o.   MRN: [id number]   Subjective:    Patient ID: Crystal Lynch, female    DOB: 1940/12/15, 78 y.o.   MRN: [id number]  HPI  Patient here for her physical exam.  She reports she is doing relatively well. Recently had her blood pressure medication adjusted by cardiology.  Lisinopril decreased to 20mg  q day.  Blood pressure doing better.  No dizziness or light headedness now.  No headache.  Does report chest pain.  No pain with increased activity or exertion.  No sob.  Question if related to acid reflux.  No dysphagia.  No abdominal pain.  Bowels moving.     Past Medical History:  Diagnosis Date  . Arthritis   . Chicken pox   . Endometriosis   . Esophagitis    s/p esophageal dilatation  . GERD (gastroesophageal reflux disease)   . History of ovarian cyst   . Hypercholesterolemia   . Hypertension   . Hypothyroidism    Past Surgical History:  Procedure Laterality Date  . ABDOMINAL SURGERY     found to have an ovarian cyst and endometriosis  . APPENDECTOMY     Family History  Problem Relation Age of Onset  . Lung disease Father   . Heart attack Father   . Heart disease Mother        myocardial infarction  . Breast cancer Mother 72  . Heart attack Mother   . Hypertension Mother   . Hyperlipidemia Mother   . Breast cancer Sister 70  . Hypertension Sister   . Rheum arthritis Sister   . Breast cancer Maternal Aunt 54  . Hypertension Brother   . Colon cancer Neg Hx    Social History   Socioeconomic History  . Marital status: Married    Spouse name: Not on file  . Number of children: Not on file  . Years of education: Not on file  . Highest education level: Not on file  Occupational History  . Not on file  Social Needs  . Financial resource strain: Not hard at all  . Food insecurity    Worry: Never true    Inability: Never true  . Transportation needs    Medical: No    Non-medical: No  Tobacco Use  .  Smoking status: Never Smoker  . Smokeless tobacco: Never Used  Substance and Sexual Activity  . Alcohol use: No    Alcohol/week: 0.0 standard drinks  . Drug use: No  . Sexual activity: Never  Lifestyle  . Physical activity    Days per week: 5 days    Minutes per session: 30 min  . Stress: Not at all  Relationships  . Social Herbalist on phone: Not on file    Gets together: Not on file    Attends religious service: Not on file    Active member of club or organization: Not on file    Attends meetings of clubs or organizations: Not on file    Relationship status: Not on file  Other Topics Concern  . Not on file  Social History Narrative  . Not on file    Outpatient Encounter Medications as of 07/25/2019  Medication Sig  . Biotin 5000 MCG CAPS Take by mouth.  . carvedilol (COREG) 6.25 MG tablet Take 1 tablet (6.25 mg total) by mouth 2 (two) times daily.  . cholecalciferol (VITAMIN D) 25 MCG (1000 UT) tablet  Take 1,000 Units by mouth daily.  Marland Kitchen levothyroxine (SYNTHROID, LEVOTHROID) 88 MCG tablet TAKE 1 TABLET DAILY  . lisinopril (ZESTRIL) 40 MG tablet Take 0.5 tablets (20 mg total) by mouth daily.  Marland Kitchen omeprazole (PRILOSEC) 20 MG capsule TAKE 1 CAPSULE DAILY  . simvastatin (ZOCOR) 10 MG tablet TAKE 1 TABLET AT BEDTIME  . spironolactone (ALDACTONE) 25 MG tablet TAKE 1 TABLET DAILY  . [DISCONTINUED] cefdinir (OMNICEF) 300 MG capsule Take 1 capsule (300 mg total) by mouth 2 (two) times daily. (Patient not taking: Reported on 07/25/2019)   No facility-administered encounter medications on file as of 07/25/2019.    Review of Systems  Constitutional: Negative for appetite change and unexpected weight change.  HENT: Negative for congestion and sinus pressure.   Eyes: Negative for pain and visual disturbance.  Respiratory: Negative for cough, chest tightness and shortness of breath.   Cardiovascular: Negative for chest pain, palpitations and leg swelling.  Gastrointestinal:  Negative for abdominal pain, diarrhea, nausea and vomiting.  Genitourinary: Negative for difficulty urinating and dysuria.  Musculoskeletal: Negative for joint swelling and myalgias.  Skin: Negative for color change and rash.  Neurological: Negative for dizziness, light-headedness and headaches.  Hematological: Negative for adenopathy. Does not bruise/bleed easily.  Psychiatric/Behavioral: Negative for agitation and dysphoric mood.       Objective:    Physical Exam Constitutional:      General: She is not in acute distress.    Appearance: Normal appearance. She is well-developed.  HENT:     Right Ear: External ear normal.     Left Ear: External ear normal.  Eyes:     General: No scleral icterus.       Right eye: No discharge.        Left eye: No discharge.     Conjunctiva/sclera: Conjunctivae normal.  Neck:     Musculoskeletal: Neck supple. No muscular tenderness.     Thyroid: No thyromegaly.  Cardiovascular:     Rate and Rhythm: Normal rate and regular rhythm.  Pulmonary:     Effort: No tachypnea, accessory muscle usage or respiratory distress.     Breath sounds: No decreased breath sounds, wheezing or rhonchi.  Chest:     Breasts:        Right: No inverted nipple, mass, nipple discharge or tenderness (no axillary adenopathy).        Left: No inverted nipple, mass, nipple discharge or tenderness (no axilarry adenopathy).  Abdominal:     General: Bowel sounds are normal.     Palpations: Abdomen is soft.     Tenderness: There is no abdominal tenderness.  Musculoskeletal:        General: No swelling or tenderness.  Lymphadenopathy:     Cervical: No cervical adenopathy.  Skin:    Findings: No erythema or rash.  Neurological:     Mental Status: She is alert and oriented to person, place, and time.  Psychiatric:        Mood and Affect: Mood normal.        Behavior: Behavior normal.     BP 116/64   Pulse 62   Temp (!) 96.6 F (35.9 C) (Temporal)   Ht 5\' 2"  (1.575  m)   Wt 132 lb 12.8 oz (60.2 kg)   LMP 10/21/1984   SpO2 99%   BMI 24.29 kg/m  Wt Readings from Last 3 Encounters:  07/25/19 132 lb 12.8 oz (60.2 kg)  05/06/19 133 lb (60.3 kg)  12/24/18 136 lb 6.4 oz (61.9  kg)     Lab Results  Component Value Date   WBC 5.4 03/19/2019   HGB 13.8 03/19/2019   HCT 40.4 03/19/2019   PLT 257.0 03/19/2019   GLUCOSE 96 03/19/2019   CHOL 161 03/19/2019   TRIG 135.0 03/19/2019   HDL 41.70 03/19/2019   LDLDIRECT 94.0 01/04/2017   LDLCALC 92 03/19/2019   ALT 15 03/19/2019   AST 15 03/19/2019   NA 137 03/19/2019   K 4.1 03/19/2019   CL 104 03/19/2019   CREATININE 0.83 03/19/2019   BUN 20 03/19/2019   CO2 24 03/19/2019   TSH 0.67 12/24/2018   INR 1.0 08/12/2015    Mm 3d Screen Breast Bilateral  Result Date: 03/12/2019 CLINICAL DATA:  Screening. EXAM: DIGITAL SCREENING BILATERAL MAMMOGRAM WITH TOMO AND CAD COMPARISON:  Previous exam(s). ACR Breast Density Category b: There are scattered areas of fibroglandular density. FINDINGS: There are no findings suspicious for malignancy. Images were processed with CAD. IMPRESSION: No mammographic evidence of malignancy. A result letter of this screening mammogram will be mailed directly to the patient. RECOMMENDATION: Screening mammogram in one year. (Code:SM-B-01Y) BI-RADS CATEGORY  1: Negative. Electronically Signed   By: Franki Cabot M.D.   On: 03/12/2019 10:27       Assessment & Plan:   Problem List Items Addressed This Visit    Chest pain    Noticed chest pain.  Does not appear to be associated with increased activity or exertion.  Increase prilosec to bid.  Discussed f/u with cardiology.        Relevant Orders   EKG 12-Lead   Esophagitis    Taking omeprazole.  Increase to bid.        Health care maintenance    Physical today 07/25/19.  Colonoscopy 08/2012.  Mammogram 03/12/19 Lissa Merlin I.        Hypercholesterolemia    On simvastatin.  Low cholesterol diet and exercise.  Follow lipid panel  and liver function tests.        Relevant Orders   Hepatic function panel   Lipid panel   Hypertension    Blood pressure under good control.  Continue same medication regimen.  Follow pressures.  Follow metabolic panel.        Relevant Orders   Basic metabolic panel   Hypothyroidism    On thyroid replacement.  Follow tsh.            Einar Pheasant, MD

## 2019-07-25 NOTE — Assessment & Plan Note (Addendum)
Physical today 07/25/19.  Colonoscopy 08/2012.  Mammogram 03/12/19 Crystal Lynch.

## 2019-07-27 ENCOUNTER — Encounter: Payer: Self-pay | Admitting: Internal Medicine

## 2019-07-27 NOTE — Assessment & Plan Note (Signed)
Blood pressure under good control.  Continue same medication regimen.  Follow pressures.  Follow metabolic panel.   

## 2019-07-27 NOTE — Assessment & Plan Note (Signed)
On thyroid replacement.  Follow tsh.  

## 2019-07-27 NOTE — Assessment & Plan Note (Signed)
Taking omeprazole.  Increase to bid.

## 2019-07-27 NOTE — Assessment & Plan Note (Signed)
On simvastatin.  Low cholesterol diet and exercise.  Follow lipid panel and liver function tests.   

## 2019-07-27 NOTE — Assessment & Plan Note (Signed)
Noticed chest pain.  Does not appear to be associated with increased activity or exertion.  Increase prilosec to bid.  Discussed f/u with cardiology.

## 2019-08-01 ENCOUNTER — Telehealth: Payer: Self-pay | Admitting: Internal Medicine

## 2019-08-01 NOTE — Telephone Encounter (Signed)
Patient aware.  Offered to schedule pt an appt again.  Pt declined.  Pt said that she doesn't want to take an antibiotic unless she knows for sure that she has a UTI.  Patient is requesting a urinalysis but does not want to schedule an appt.  Patient said that she would wait it out over the weekend and see how it goes.  Informed pt that Dr. Nicki Reaper was out of the office today and covering provider declined w/o an office appt.  Will forward over to Dr. Nicki Reaper as Juluis Rainier.

## 2019-08-01 NOTE — Telephone Encounter (Signed)
Patient calling because she feels that she has a possible UTI. Burning with urination. Patient would like a Urinalysis  Ordered. Patient declined scheduling appointment. She had CPE 07/25/2019.  Please advise CB- [phone]

## 2019-08-01 NOTE — Telephone Encounter (Signed)
DENIED WITHOUT OFFICE APPT

## 2019-08-04 NOTE — Telephone Encounter (Signed)
Pt confirmed she is doing ok. She would like to hold off on U/A. She thought that she would be having one checked with her labs in 3 weeks. She was going to see if she could just come in early and have them done. Advised U/A would not be ran unless she is having symptoms. Patient stated she is not really having any symptoms. She had a little discomfort on Friday. Would like to hold on appt or coming in for labs.

## 2019-08-04 NOTE — Telephone Encounter (Signed)
Please call and confirm pt doing ok.  Let me know if needs anything.

## 2019-08-14 ENCOUNTER — Other Ambulatory Visit: Payer: Self-pay

## 2019-08-14 ENCOUNTER — Other Ambulatory Visit (INDEPENDENT_AMBULATORY_CARE_PROVIDER_SITE_OTHER): Payer: Medicare Other

## 2019-08-14 DIAGNOSIS — I10 Essential (primary) hypertension: Secondary | ICD-10-CM

## 2019-08-14 DIAGNOSIS — E7800 Pure hypercholesterolemia, unspecified: Secondary | ICD-10-CM | POA: Diagnosis not present

## 2019-08-14 LAB — HEPATIC FUNCTION PANEL
ALT: 16 U/L (ref 0–35)
AST: 16 U/L (ref 0–37)
Albumin: 4.3 g/dL (ref 3.5–5.2)
Alkaline Phosphatase: 65 U/L (ref 39–117)
Bilirubin, Direct: 0.2 mg/dL (ref 0.0–0.3)
Total Bilirubin: 1.1 mg/dL (ref 0.2–1.2)
Total Protein: 6.3 g/dL (ref 6.0–8.3)

## 2019-08-14 LAB — LIPID PANEL
Cholesterol: 163 mg/dL (ref 0–200)
HDL: 41.4 mg/dL (ref >39.00)
LDL Cholesterol: 88 mg/dL (ref 0–99)
NonHDL: 121.96
Total CHOL/HDL Ratio: 4
Triglycerides: 171 mg/dL — ABNORMAL HIGH (ref 0.0–149.0)
VLDL: 34.2 mg/dL (ref 0.0–40.0)

## 2019-08-14 LAB — BASIC METABOLIC PANEL
BUN: 12 mg/dL (ref 6–23)
CO2: 27 mEq/L (ref 19–32)
Calcium: 10.1 mg/dL (ref 8.4–10.5)
Chloride: 97 mEq/L (ref 96–112)
Creatinine, Ser: 0.86 mg/dL (ref 0.40–1.20)
GFR: 63.54 mL/min (ref >60.00)
Glucose, Bld: 98 mg/dL (ref 70–99)
Potassium: 4.4 mEq/L (ref 3.5–5.1)
Sodium: 132 mEq/L — ABNORMAL LOW (ref 135–145)

## 2019-08-16 ENCOUNTER — Encounter: Payer: Self-pay | Admitting: Internal Medicine

## 2019-08-16 ENCOUNTER — Other Ambulatory Visit: Payer: Self-pay | Admitting: Internal Medicine

## 2019-08-16 DIAGNOSIS — E871 Hypo-osmolality and hyponatremia: Secondary | ICD-10-CM

## 2019-08-16 NOTE — Progress Notes (Signed)
Order placed for f/u sodium.  ?

## 2019-09-02 ENCOUNTER — Other Ambulatory Visit (INDEPENDENT_AMBULATORY_CARE_PROVIDER_SITE_OTHER): Payer: Medicare Other

## 2019-09-02 ENCOUNTER — Other Ambulatory Visit: Payer: Self-pay

## 2019-09-02 DIAGNOSIS — E871 Hypo-osmolality and hyponatremia: Secondary | ICD-10-CM

## 2019-09-02 LAB — SODIUM: Sodium: 131 mEq/L — ABNORMAL LOW (ref 135–145)

## 2019-09-04 NOTE — Addendum Note (Signed)
Addended by: Lars Masson on: 09/04/2019 02:35 PM   Modules accepted: Orders

## 2019-09-11 ENCOUNTER — Other Ambulatory Visit: Payer: Self-pay

## 2019-09-15 ENCOUNTER — Other Ambulatory Visit (INDEPENDENT_AMBULATORY_CARE_PROVIDER_SITE_OTHER): Payer: Medicare Other

## 2019-09-15 ENCOUNTER — Other Ambulatory Visit: Payer: Self-pay

## 2019-09-15 DIAGNOSIS — E871 Hypo-osmolality and hyponatremia: Secondary | ICD-10-CM | POA: Diagnosis not present

## 2019-09-15 LAB — SODIUM: Sodium: 136 mEq/L (ref 135–145)

## 2019-09-16 ENCOUNTER — Encounter: Payer: Self-pay | Admitting: Internal Medicine

## 2019-10-27 ENCOUNTER — Other Ambulatory Visit: Payer: Self-pay

## 2019-10-27 ENCOUNTER — Telehealth: Payer: Self-pay | Admitting: Cardiovascular Disease

## 2019-10-27 ENCOUNTER — Ambulatory Visit (INDEPENDENT_AMBULATORY_CARE_PROVIDER_SITE_OTHER): Payer: Medicare Other | Admitting: Internal Medicine

## 2019-10-27 ENCOUNTER — Encounter: Payer: Self-pay | Admitting: Internal Medicine

## 2019-10-27 VITALS — BP 168/68 | HR 61 | Ht 62.0 in | Wt 131.0 lb

## 2019-10-27 DIAGNOSIS — E7800 Pure hypercholesterolemia, unspecified: Secondary | ICD-10-CM | POA: Diagnosis not present

## 2019-10-27 DIAGNOSIS — I10 Essential (primary) hypertension: Secondary | ICD-10-CM

## 2019-10-27 DIAGNOSIS — K209 Esophagitis, unspecified without bleeding: Secondary | ICD-10-CM | POA: Diagnosis not present

## 2019-10-27 DIAGNOSIS — E039 Hypothyroidism, unspecified: Secondary | ICD-10-CM

## 2019-10-27 DIAGNOSIS — R079 Chest pain, unspecified: Secondary | ICD-10-CM

## 2019-10-27 MED ORDER — OMEPRAZOLE 20 MG PO CPDR: 20.0000 mg | DELAYED_RELEASE_CAPSULE | Freq: Two times a day (BID) | ORAL | 1 refills | Status: DC

## 2019-10-27 NOTE — Telephone Encounter (Signed)
Call to patient to discuss incident that took place on Friday. She reports she had traditional meal of country ham and cabbage. She felt pain in L chest that radiated to back lasting several hours.   EMS was called and did EKG which was "normal". They advised her to take antacid at that time. She refused ED since EKG looked okay. Pain resolved in a few hours.   She is due for yearly with cardiologist and appt was made for tomorrow.   PCP agreed it was likely indigestion but agrees with cards follow up.   Advised pt to call for any further questions or concerns.

## 2019-10-27 NOTE — Telephone Encounter (Signed)
Pt c/o BP issue: STAT if pt c/o blurred vision, one-sided weakness or slurred speech  1. What are your last 5 BP readings? 202/70's 160/68   2. Are you having any other symptoms (ex. Dizziness, headache, blurred vision, passed out)?  Burning in chest x 1 episode Friday called emt   3. What is your BP issue? Wants to look at adjusting medications    Scheduled tommorow with Arida at 2

## 2019-10-27 NOTE — Progress Notes (Signed)
Patient ID: Crystal Lynch, female   DOB: 06-09-1941, 79 y.o.   MRN: [id number]   Virtual Visit via video Note  This visit type was conducted due to national recommendations for restrictions regarding the COVID-19 pandemic (e.g. social distancing).  This format is felt to be most appropriate for this patient at this time.  All issues noted in this document were discussed and addressed.  No physical exam was performed (except for noted visual exam findings with Video Visits).   I connected with [name] by a video enabled telemedicine application and verified that I am speaking with the correct person using two identifiers. Location patient: home Location provider: work Persons participating in the virtual visit: patient, provider  The limitations, risks, security and privacy concerns of performing an evaluation and management service by video and the availability of in person appointments have been discussed.  The patient expressed understanding and agreed to proceed.   Reason for visit: scheduled follow up.   HPI: Reports had chest pain 10/24/19.  Was lying in bed watching a movie.  Started feeling what she described as indigestion/chest pain - center of her chest. Pain also radiated through to her back.  No actual acid reflux.  Persistent pain despite TUMS and other otc medication.  After 2 hours,  Called 911.  EKG per her report - ok.  Discussed ER evaluation.  She preferred not to go to ER.  States EMTs were questioning if related to acid reflux.  She took an additional prilosec that evening.  Since that evening, she has not had any further chest pain.  Eating and drinking well.  No nausea or vomiting.  No abdominal pain.  Bowels moving.  Denies diarrhea or constipation.  Reports no chest pain with increased exertion, but does report she tires easier.  This has been gradual.  Her blood pressure has been elevated as well.  When EMTs took bp = 102/60.  Since then has been averaging 160/60-70s.   Plans to call Dr Fletcher Anon today to discuss.  Recently had her lisinopril decreased to 20mg  q day.     ROS: See pertinent positives and negatives per HPI.  Past Medical History:  Diagnosis Date  . Arthritis   . Chicken pox   . Endometriosis   . Esophagitis    s/p esophageal dilatation  . GERD (gastroesophageal reflux disease)   . History of ovarian cyst   . Hypercholesterolemia   . Hypertension   . Hypothyroidism     Past Surgical History:  Procedure Laterality Date  . ABDOMINAL SURGERY     found to have an ovarian cyst and endometriosis  . APPENDECTOMY      Family History  Problem Relation Age of Onset  . Lung disease Father   . Heart attack Father   . Heart disease Mother        myocardial infarction  . Breast cancer Mother 52  . Heart attack Mother   . Hypertension Mother   . Hyperlipidemia Mother   . Breast cancer Sister 21  . Hypertension Sister   . Rheum arthritis Sister   . Breast cancer Maternal Aunt 77  . Hypertension Brother   . Colon cancer Neg Hx     SOCIAL HX: reviewed.    Current Outpatient Medications:  .  Biotin 5000 MCG CAPS, Take by mouth., Disp: , Rfl:  .  carvedilol (COREG) 6.25 MG tablet, Take 1 tablet (6.25 mg total) by mouth 2 (two) times daily., Disp: 180 tablet, Rfl:  0 .  Cholecalciferol (VITAMIN D3 PO), Take by mouth daily., Disp: , Rfl:  .  levothyroxine (SYNTHROID, LEVOTHROID) 88 MCG tablet, TAKE 1 TABLET DAILY, Disp: 90 tablet, Rfl: 4 .  lisinopril (ZESTRIL) 40 MG tablet, Take 1 tablet (40 mg total) by mouth daily., Disp: 90 tablet, Rfl: 3 .  Multiple Vitamins-Minerals (ZINC PO), Take by mouth daily., Disp: , Rfl:  .  omeprazole (PRILOSEC) 20 MG capsule, Take 1 capsule (20 mg total) by mouth 2 (two) times daily., Disp: 90 capsule, Rfl: 1 .  simvastatin (ZOCOR) 10 MG tablet, TAKE 1 TABLET AT BEDTIME, Disp: 90 tablet, Rfl: 4 .  spironolactone (ALDACTONE) 25 MG tablet, TAKE 1 TABLET DAILY, Disp: 90 tablet, Rfl: 2  EXAM:  VITALS per  patient if applicable: [id number]  GENERAL: alert, oriented, appears well and in no acute distress  HEENT: atraumatic, conjunttiva clear, no obvious abnormalities on inspection of external nose and ears  NECK: normal movements of the head and neck  LUNGS: on inspection no signs of respiratory distress, breathing rate appears normal, no obvious gross SOB, gasping or wheezing  CV: no obvious cyanosis  PSYCH/NEURO: pleasant and cooperative, no obvious depression or anxiety, speech and thought processing grossly intact  ASSESSMENT AND PLAN:  Discussed the following assessment and plan:  Chest pain Chest pain as outlined.  Described as indigestion/chest pain - center of her chest - radiating to her back.  Evaluated by EMTs.  Declined ER evaluation.  Plans to f/u with her cardiologist today.  prilosec as directed.  Check abdominal ultrasound to confirm no gallbladder etiology.    Esophagitis Omeprazole bid.  Follow.    Hyperbilirubinemia Check abdominal ultrasound as outlined.    Hypercholesterolemia On simvastatin.  Low cholesterol diet and exercise.  Follow lipid panel and liver function tests.    Hypertension Blood pressure elevated. Discussed with her regarding increasing lisinopril to 40mg  q day.  She plans to discuss with cardiology.    Hypothyroidism On thyroid replacement.  Follow tsh.     Orders Placed This Encounter  Procedures  . US Abdomen Complete    Standing Status:   Future    Standing Expiration Date:   12/29/2020    Order Specific Question:   Reason for Exam (SYMPTOM  OR DIAGNOSIS REQUIRED)    Answer:   chest pain/back pain - abdominal pain    Order Specific Question:   Preferred imaging location?    Answer:   [facility]  . Hepatic function panel    Standing Status:   Future    Standing Expiration Date:   10/26/2020  . Lipid panel    Standing Status:   Future    Standing Expiration Date:   10/26/2020  . Basic metabolic panel (future)    Standing Status:    Future    Standing Expiration Date:   10/26/2020    Meds ordered this encounter  Medications  . omeprazole (PRILOSEC) 20 MG capsule    Sig: Take 1 capsule (20 mg total) by mouth 2 (two) times daily.    Dispense:  90 capsule    Refill:  1     I discussed the assessment and treatment plan with the patient. The patient was provided an opportunity to ask questions and all were answered. The patient agreed with the plan and demonstrated an understanding of the instructions.   The patient was advised to call back or seek an in-person evaluation if the symptoms worsen or if the condition fails to improve as  anticipated.   Einar Pheasant, MD

## 2019-10-28 ENCOUNTER — Ambulatory Visit (INDEPENDENT_AMBULATORY_CARE_PROVIDER_SITE_OTHER): Payer: Medicare Other | Admitting: Cardiovascular Disease

## 2019-10-28 ENCOUNTER — Encounter: Payer: Self-pay | Admitting: Cardiovascular Disease

## 2019-10-28 VITALS — BP 120/70 | HR 74 | Ht 62.0 in | Wt 134.5 lb

## 2019-10-28 DIAGNOSIS — I10 Essential (primary) hypertension: Secondary | ICD-10-CM

## 2019-10-28 DIAGNOSIS — E785 Hyperlipidemia, unspecified: Secondary | ICD-10-CM | POA: Diagnosis not present

## 2019-10-28 DIAGNOSIS — R0789 Other chest pain: Secondary | ICD-10-CM

## 2019-10-28 MED ORDER — LISINOPRIL 40 MG PO TABS: 40.0000 mg | ORAL_TABLET | Freq: Every day | ORAL | 3 refills | Status: DC

## 2019-10-28 NOTE — Progress Notes (Signed)
Cardiology Office Note   Date:  10/28/2019   ID:  Crystal, Lynch [birth year], MRN [id number]  PCP:  Crystal Pheasant, MD  Cardiologist:   Kathlyn Sacramento, MD   Chief Complaint  Patient presents with  . OTHER    12 month f/u LS 1/20 telephone note c/o Elevated BP. Meds reviewed verbally with pt.      History of Present Illness: Crystal Lynch is a 79 y.o. female who presents for  a follow-up visit regarding refractory  Hypertension. She has no previous cardiac history. Other medical problems include hyperlipidemia and GERD.  Renal artery angiography in October 2016 showed no significant renal artery stenosis.  On New Year's day, she had country ham and cabbage.  Shortly after, she started having severe substernal burning sensation radiating to the right side.  This lasted for about 2 hours and thus she called EMS.  Her blood pressure was 210/94.  EKG showed no acute changes and her symptoms were felt to be due to GERD.  No recurrent symptoms since then.  She does record her blood pressure regularly at home and blood pressure has been elevated.   Past Medical History:  Diagnosis Date  . Arthritis   . Chicken pox   . Endometriosis   . Esophagitis    s/p esophageal dilatation  . GERD (gastroesophageal reflux disease)   . History of ovarian cyst   . Hypercholesterolemia   . Hypertension   . Hypothyroidism     Past Surgical History:  Procedure Laterality Date  . ABDOMINAL SURGERY     found to have an ovarian cyst and endometriosis  . APPENDECTOMY       Current Outpatient Medications  Medication Sig Dispense Refill  . Biotin 5000 MCG CAPS Take by mouth.    . carvedilol (COREG) 6.25 MG tablet Take 1 tablet (6.25 mg total) by mouth 2 (two) times daily. 180 tablet 0  . Cholecalciferol (VITAMIN D3 PO) Take by mouth daily.    Marland Kitchen levothyroxine (SYNTHROID, LEVOTHROID) 88 MCG tablet TAKE 1 TABLET DAILY 90 tablet 4  . lisinopril (ZESTRIL) 40 MG tablet Take 0.5 tablets (20  mg total) by mouth daily. 180 tablet 3  . Multiple Vitamins-Minerals (ZINC PO) Take by mouth daily.    Marland Kitchen omeprazole (PRILOSEC) 20 MG capsule Take 1 capsule (20 mg total) by mouth 2 (two) times daily. 90 capsule 1  . simvastatin (ZOCOR) 10 MG tablet TAKE 1 TABLET AT BEDTIME 90 tablet 4  . spironolactone (ALDACTONE) 25 MG tablet TAKE 1 TABLET DAILY 90 tablet 2   No current facility-administered medications for this visit.    Allergies:   Meloxicam    Social History:  The patient  reports that she has never smoked. She has never used smokeless tobacco. She reports that she does not drink alcohol or use drugs.   Family History:  The patient's family history includes Breast cancer (age of onset: 69) in her maternal aunt; Breast cancer (age of onset: 3) in her sister; Breast cancer (age of onset: 26) in her mother; Heart attack in her father and mother; Heart disease in her mother; Hyperlipidemia in her mother; Hypertension in her brother, mother, and sister; Lung disease in her father; Rheum arthritis in her sister.    ROS:  Please see the history of present illness.   Otherwise, review of systems are positive for none.   All other systems are reviewed and negative.    PHYSICAL EXAM: VS:  BP 120/70 (  BP Location: Left Arm, Patient Position: Sitting, Cuff Size: Normal)   Pulse 74   Ht 5\' 2"  (1.575 m)   Wt 134 lb 8 oz (61 kg)   LMP 10/21/1984   SpO2 99%   BMI 24.60 kg/m  , BMI Body mass index is 24.6 kg/m. GEN: Well nourished, well developed, in no acute distress  HEENT: normal  Neck: no JVD, carotid bruits, or masses Cardiac: RRR; no murmurs, rubs, or gallops,no edema  Respiratory:  clear to auscultation bilaterally, normal work of breathing GI: soft, nontender, nondistended, + BS MS: no deformity or atrophy  Skin: warm and dry, no rash Neuro:  Strength and sensation are intact Psych: euthymic mood, full affect   EKG:  EKG is ordered today. The ekg ordered today demonstrates   normal sinus rhythm with no significant ST or T wave changes.   Recent Labs: 12/24/2018: TSH 0.67 03/19/2019: Hemoglobin 13.8; Platelets 257.0 08/14/2019: ALT 16; BUN 12; Creatinine, Ser 0.86; Potassium 4.4 09/15/2019: Sodium 136    Lipid Panel    Component Value Date/Time   CHOL 163 08/14/2019 0831   TRIG 171.0 (H) 08/14/2019 0831   HDL 41.40 08/14/2019 0831   CHOLHDL 4 08/14/2019 0831   VLDL 34.2 08/14/2019 0831   LDLCALC 88 08/14/2019 0831   LDLDIRECT 94.0 01/04/2017 0823      Wt Readings from Last 3 Encounters:  10/28/19 134 lb 8 oz (61 kg)  10/27/19 131 lb (59.4 kg)  07/25/19 132 lb 12.8 oz (60.2 kg)       No flowsheet data found.    ASSESSMENT AND PLAN:  1.  Atypical chest pain: Likely related to GERD.  She is going to have abdominal ultrasound as well to rule out gallbladder disease.  She has no exertional symptoms and her EKG is normal.  Thus, I do not recommend further cardiac evaluation unless she gets recurrent symptoms.  2. Essential hypertension: Blood pressure is controlled here today but has been elevated at home.  I elected to increase lisinopril back to 40 mg once daily.    3. Hyperlipidemia: Continue simvastatin.  Most recent lipid profile showed an LDL of 88.     Disposition:   FU with me in 6 months  Signed,  Kathlyn Sacramento, MD  10/28/2019 2:28 PM    [place]

## 2019-10-28 NOTE — Patient Instructions (Signed)
Medication Instructions:  Your physician has recommended you make the following change in your medication:  INCREASE Lisinopril to 40mg  daily. An Rx has been sent to your mail order pharmacy.  *If you need a refill on your cardiac medications before your next appointment, please call your pharmacy*  Lab Work: None ordered If you have labs (blood work) drawn today and your tests are completely normal, you will receive your results only by: Marland Kitchen MyChart Message (if you have MyChart) OR . A paper copy in the mail If you have any lab test that is abnormal or we need to change your treatment, we will call you to review the results.  Testing/Procedures: None ordered  Follow-Up: At [facility], you and your health needs are our priority.  As part of our continuing mission to provide you with exceptional heart care, we have created designated Provider Care Teams.  These Care Teams include your primary Cardiologist (physician) and Advanced Practice Providers (APPs -  Physician Assistants and Nurse Practitioners) who all work together to provide you with the care you need, when you need it.  Your next appointment:   6 month(s)  The format for your next appointment:   In Person  Provider:    You may see Dr. Fletcher Anon  or one of the following Advanced Practice Providers on your designated Care Team:    Murray Hodgkins, NP  Christell Faith, PA-C  Marrianne Mood, PA-C   Other Instructions N/A

## 2019-11-01 NOTE — Assessment & Plan Note (Signed)
On simvastatin.  Low cholesterol diet and exercise.  Follow lipid panel and liver function tests.   

## 2019-11-01 NOTE — Assessment & Plan Note (Signed)
Chest pain as outlined.  Described as indigestion/chest pain - center of her chest - radiating to her back.  Evaluated by EMTs.  Declined ER evaluation.  Plans to f/u with her cardiologist today.  prilosec as directed.  Check abdominal ultrasound to confirm no gallbladder etiology.

## 2019-11-01 NOTE — Assessment & Plan Note (Signed)
Blood pressure elevated. Discussed with her regarding increasing lisinopril to 40mg  q day.  She plans to discuss with cardiology.

## 2019-11-01 NOTE — Assessment & Plan Note (Signed)
Omeprazole bid.  Follow.

## 2019-11-01 NOTE — Assessment & Plan Note (Signed)
Check abdominal ultrasound as outlined.

## 2019-11-01 NOTE — Assessment & Plan Note (Signed)
On thyroid replacement.  Follow tsh.  

## 2019-11-24 ENCOUNTER — Other Ambulatory Visit: Payer: Self-pay | Admitting: Cardiovascular Disease

## 2019-11-24 MED ORDER — CARVEDILOL 6.25 MG PO TABS: 6.2500 mg | ORAL_TABLET | Freq: Two times a day (BID) | ORAL | 1 refills | Status: DC

## 2019-11-24 NOTE — Telephone Encounter (Signed)
Requested Prescriptions   Signed Prescriptions Disp Refills   carvedilol (COREG) 6.25 MG tablet 180 tablet 1    Sig: Take 1 tablet (6.25 mg total) by mouth 2 (two) times daily.    Authorizing Provider: Kathlyn Sacramento A    Ordering User: Raelene Bott, Danny Zimny L

## 2019-11-24 NOTE — Telephone Encounter (Signed)
*  STAT* If patient is at the pharmacy, call can be transferred to refill team.   1. Which medications need to be refilled? (please list name of each medication and dose if known) carvedilol 6.25 mg bid  2. Which pharmacy/location (including street and city if local pharmacy) is medication to be sent to? [facility]  3. Do they need a 30 day or 90 day supply? 90  All future RX's need to be sent through [facility]

## 2019-11-26 ENCOUNTER — Other Ambulatory Visit: Payer: Self-pay

## 2019-11-26 ENCOUNTER — Ambulatory Visit
Admission: RE | Admit: 2019-11-26 | Discharge: 2019-11-26 | Disposition: A | Discharge status: Home / Self Care | Payer: Medicare Other | Source: Ambulatory Visit | Attending: Internal Medicine | Admitting: Internal Medicine

## 2019-11-26 DIAGNOSIS — R079 Chest pain, unspecified: Secondary | ICD-10-CM | POA: Diagnosis not present

## 2019-11-29 ENCOUNTER — Other Ambulatory Visit: Payer: Self-pay | Admitting: Internal Medicine

## 2019-11-29 DIAGNOSIS — K760 Fatty (change of) liver, not elsewhere classified: Secondary | ICD-10-CM

## 2019-11-29 NOTE — Progress Notes (Signed)
Order placed for GI referral.   

## 2019-12-18 ENCOUNTER — Other Ambulatory Visit (INDEPENDENT_AMBULATORY_CARE_PROVIDER_SITE_OTHER): Payer: Medicare Other

## 2019-12-18 ENCOUNTER — Other Ambulatory Visit: Payer: Self-pay

## 2019-12-18 DIAGNOSIS — E7800 Pure hypercholesterolemia, unspecified: Secondary | ICD-10-CM | POA: Diagnosis not present

## 2019-12-18 DIAGNOSIS — I10 Essential (primary) hypertension: Secondary | ICD-10-CM | POA: Diagnosis not present

## 2019-12-18 DIAGNOSIS — R079 Chest pain, unspecified: Secondary | ICD-10-CM

## 2019-12-18 LAB — CBC WITH DIFFERENTIAL/PLATELET
Basophils Absolute: 0 10*3/uL (ref 0.0–0.1)
Basophils Relative: 0.2 % (ref 0.0–3.0)
Eosinophils Absolute: 0.1 10*3/uL (ref 0.0–0.7)
Eosinophils Relative: 1.9 % (ref 0.0–5.0)
HCT: 41.8 % (ref 36.0–46.0)
Hemoglobin: 13.8 g/dL (ref 12.0–15.0)
Lymphocytes Relative: 41.5 % (ref 12.0–46.0)
Lymphs Abs: 1.8 10*3/uL (ref 0.7–4.0)
MCHC: 33.1 g/dL (ref 30.0–36.0)
MCV: 93.6 fl (ref 78.0–100.0)
Monocytes Absolute: 0.4 10*3/uL (ref 0.1–1.0)
Monocytes Relative: 8.4 % (ref 3.0–12.0)
Neutro Abs: 2.1 10*3/uL (ref 1.4–7.7)
Neutrophils Relative %: 48 % (ref 43.0–77.0)
Platelets: 227 10*3/uL (ref 150.0–400.0)
RBC: 4.47 Mil/uL (ref 3.87–5.11)
RDW: 12.6 % (ref 11.5–15.5)
WBC: 4.3 10*3/uL (ref 4.0–10.5)

## 2019-12-18 LAB — BASIC METABOLIC PANEL
BUN: 16 mg/dL (ref 6–23)
CO2: 28 mEq/L (ref 19–32)
Calcium: 10.4 mg/dL (ref 8.4–10.5)
Chloride: 104 mEq/L (ref 96–112)
Creatinine, Ser: 0.89 mg/dL (ref 0.40–1.20)
GFR: 61.02 mL/min (ref >60.00)
Glucose, Bld: 96 mg/dL (ref 70–99)
Potassium: 3.8 mEq/L (ref 3.5–5.1)
Sodium: 139 mEq/L (ref 135–145)

## 2019-12-18 LAB — HEPATIC FUNCTION PANEL
ALT: 19 U/L (ref 0–35)
AST: 17 U/L (ref 0–37)
Albumin: 4.2 g/dL (ref 3.5–5.2)
Alkaline Phosphatase: 70 U/L (ref 39–117)
Bilirubin, Direct: 0.1 mg/dL (ref 0.0–0.3)
Total Bilirubin: 1 mg/dL (ref 0.2–1.2)
Total Protein: 6.5 g/dL (ref 6.0–8.3)

## 2019-12-18 LAB — LIPID PANEL
Cholesterol: 162 mg/dL (ref 0–200)
HDL: 41.1 mg/dL (ref >39.00)
LDL Cholesterol: 82 mg/dL (ref 0–99)
NonHDL: 120.92
Total CHOL/HDL Ratio: 4
Triglycerides: 194 mg/dL — ABNORMAL HIGH (ref 0.0–149.0)
VLDL: 38.8 mg/dL (ref 0.0–40.0)

## 2019-12-22 ENCOUNTER — Ambulatory Visit (INDEPENDENT_AMBULATORY_CARE_PROVIDER_SITE_OTHER): Payer: Medicare Other | Admitting: Internal Medicine

## 2019-12-22 ENCOUNTER — Other Ambulatory Visit: Payer: Self-pay

## 2019-12-22 ENCOUNTER — Encounter: Payer: Self-pay | Admitting: Internal Medicine

## 2019-12-22 DIAGNOSIS — K760 Fatty (change of) liver, not elsewhere classified: Secondary | ICD-10-CM

## 2019-12-22 DIAGNOSIS — E039 Hypothyroidism, unspecified: Secondary | ICD-10-CM

## 2019-12-22 DIAGNOSIS — R319 Hematuria, unspecified: Secondary | ICD-10-CM

## 2019-12-22 DIAGNOSIS — R079 Chest pain, unspecified: Secondary | ICD-10-CM

## 2019-12-22 DIAGNOSIS — E7800 Pure hypercholesterolemia, unspecified: Secondary | ICD-10-CM

## 2019-12-22 DIAGNOSIS — I10 Essential (primary) hypertension: Secondary | ICD-10-CM

## 2019-12-22 NOTE — Progress Notes (Signed)
Patient ID: Crystal Lynch, female   DOB: Dec 06, 1940, 79 y.o.   MRN: [id number]   Subjective:    Patient ID: Crystal Lynch, female    DOB: [birth year], 79 y.o.   MRN: [id number]  HPI This visit occurred during the SARS-CoV-2 public health emergency.  Safety protocols were in place, including screening questions prior to the visit, additional usage of staff PPE, and extensive cleaning of exam room while observing appropriate contact time as indicated for disinfecting solutions.  Patient here for a scheduled follow up.  She reports she is doing relatively well. Trying to stay active.  Right hip - bursitis.  Sees ortho.  Stable.  Saw Dr Fletcher Anon recently for f/u chest pain. Note reviewed. Felt likely related to GERD.  Abdominal ultrasound revealed no acute abnormality.  Fatty liver present. No further chest pain.  On omeprazole bid.  Discussed elevated triglycerides.  She has cut down on her sweets.  No abdominal pain.  Bowels moving.  Handling stress.  Blood pressure averaging 140-150/70s.  Lisinopril just increased to 40mg  q day.    Past Medical History:  Diagnosis Date  . Arthritis   . Chicken pox   . Endometriosis   . Esophagitis    s/p esophageal dilatation  . GERD (gastroesophageal reflux disease)   . History of ovarian cyst   . Hypercholesterolemia   . Hypertension   . Hypothyroidism    Past Surgical History:  Procedure Laterality Date  . ABDOMINAL SURGERY     found to have an ovarian cyst and endometriosis  . APPENDECTOMY     Family History  Problem Relation Age of Onset  . Lung disease Father   . Heart attack Father   . Heart disease Mother        myocardial infarction  . Breast cancer Mother 61  . Heart attack Mother   . Hypertension Mother   . Hyperlipidemia Mother   . Breast cancer Sister 43  . Hypertension Sister   . Rheum arthritis Sister   . Breast cancer Maternal Aunt 18  . Hypertension Brother   . Colon cancer Neg Hx    Social History   Socioeconomic  History  . Marital status: Married    Spouse name: Not on file  . Number of children: Not on file  . Years of education: Not on file  . Highest education level: Not on file  Occupational History  . Not on file  Tobacco Use  . Smoking status: Never Smoker  . Smokeless tobacco: Never Used  Substance and Sexual Activity  . Alcohol use: No    Alcohol/week: 0.0 standard drinks  . Drug use: No  . Sexual activity: Never  Other Topics Concern  . Not on file  Social History Narrative  . Not on file   Social Determinants of Health   Financial Resource Strain:   . Difficulty of Paying Living Expenses: Not on file  Food Insecurity:   . Worried About Charity fundraiser in the Last Year: Not on file  . Ran Out of Food in the Last Year: Not on file  Transportation Needs:   . Lack of Transportation (Medical): Not on file  . Lack of Transportation (Non-Medical): Not on file  Physical Activity: Sufficiently Active  . Days of Exercise per Week: 5 days  . Minutes of Exercise per Session: 30 min  Stress:   . Feeling of Stress : Not on file  Social Connections:   . Frequency of Communication with  Friends and Family: Not on file  . Frequency of Social Gatherings with Friends and Family: Not on file  . Attends Religious Services: Not on file  . Active Member of Clubs or Organizations: Not on file  . Attends Archivist Meetings: Not on file  . Marital Status: Not on file    Outpatient Encounter Medications as of 12/22/2019  Medication Sig  . Biotin 5000 MCG CAPS Take by mouth.  . carvedilol (COREG) 6.25 MG tablet Take 1 tablet (6.25 mg total) by mouth 2 (two) times daily.  . Cholecalciferol (VITAMIN D3 PO) Take by mouth daily.  Marland Kitchen levothyroxine (SYNTHROID, LEVOTHROID) 88 MCG tablet TAKE 1 TABLET DAILY  . lisinopril (ZESTRIL) 40 MG tablet Take 1 tablet (40 mg total) by mouth daily.  . Multiple Vitamins-Minerals (ZINC PO) Take by mouth daily.  Marland Kitchen omeprazole (PRILOSEC) 20 MG capsule  Take 1 capsule (20 mg total) by mouth 2 (two) times daily.  . simvastatin (ZOCOR) 10 MG tablet TAKE 1 TABLET AT BEDTIME  . spironolactone (ALDACTONE) 25 MG tablet TAKE 1 TABLET DAILY   No facility-administered encounter medications on file as of 12/22/2019.    Review of Systems  Constitutional: Negative for appetite change and unexpected weight change.  HENT: Negative for congestion and sinus pressure.   Respiratory: Negative for cough, chest tightness and shortness of breath.   Cardiovascular: Negative for chest pain, palpitations and leg swelling.  Gastrointestinal: Negative for abdominal pain, diarrhea, nausea and vomiting.  Genitourinary: Negative for difficulty urinating and dysuria.  Musculoskeletal: Negative for joint swelling and myalgias.  Skin: Negative for color change and rash.  Neurological: Negative for dizziness, light-headedness and headaches.  Psychiatric/Behavioral: Negative for agitation and dysphoric mood.       Objective:    Physical Exam Constitutional:      General: She is not in acute distress.    Appearance: Normal appearance.  HENT:     Right Ear: External ear normal.     Left Ear: External ear normal.  Eyes:     General: No scleral icterus.       Right eye: No discharge.        Left eye: No discharge.     Conjunctiva/sclera: Conjunctivae normal.  Neck:     Thyroid: No thyromegaly.  Cardiovascular:     Rate and Rhythm: Normal rate and regular rhythm.  Pulmonary:     Effort: No respiratory distress.     Breath sounds: Normal breath sounds. No wheezing.  Abdominal:     General: Bowel sounds are normal.     Palpations: Abdomen is soft.     Tenderness: There is no abdominal tenderness.  Musculoskeletal:        General: No swelling or tenderness.     Cervical back: Neck supple. No tenderness.  Lymphadenopathy:     Cervical: No cervical adenopathy.  Skin:    Findings: No erythema or rash.  Neurological:     Mental Status: She is alert.    Psychiatric:        Mood and Affect: Mood normal.        Behavior: Behavior normal.     BP 126/62   Pulse 73   Temp (!) 97.1 F (36.2 C)   Resp 16   Ht 5\' 2"  (1.575 m)   Wt 138 lb 9.6 oz (62.9 kg)   LMP 10/21/1984   SpO2 98%   BMI 25.35 kg/m  Wt Readings from Last 3 Encounters:  12/22/19 138 lb 9.6  oz (62.9 kg)  10/28/19 134 lb 8 oz (61 kg)  10/27/19 131 lb (59.4 kg)     Lab Results  Component Value Date   WBC 4.3 12/18/2019   HGB 13.8 12/18/2019   HCT 41.8 12/18/2019   PLT 227.0 12/18/2019   GLUCOSE 96 12/18/2019   CHOL 162 12/18/2019   TRIG 194.0 (H) 12/18/2019   HDL 41.10 12/18/2019   LDLDIRECT 94.0 01/04/2017   LDLCALC 82 12/18/2019   ALT 19 12/18/2019   AST 17 12/18/2019   NA 139 12/18/2019   K 3.8 12/18/2019   CL 104 12/18/2019   CREATININE 0.89 12/18/2019   BUN 16 12/18/2019   CO2 28 12/18/2019   TSH 0.67 12/24/2018   INR 1.0 08/12/2015    US Abdomen Complete  Result Date: 11/26/2019 CLINICAL DATA:  Back and abdominal pain. EXAM: ABDOMEN ULTRASOUND COMPLETE COMPARISON:  CT scan 05/08/2018. FINDINGS: Gallbladder: No gallstones or gallbladder wall thickening. No pericholecystic fluid. The sonographer reports no sonographic Murphy's sign. Common bile duct: Diameter: 3 mm Liver: Mild coarsening of the liver parenchyma shows increased echogenicity with poor acoustic through transmission, imaging features suggesting fatty deposition within the parenchyma. No focal abnormality by ultrasound. Portal vein is patent on color Doppler imaging with normal direction of blood flow towards the liver. IVC: No abnormality visualized. Pancreas: Visualized portion unremarkable. Spleen: Size and appearance within normal limits. Right Kidney: Length: 9.4 cm. Echogenicity within normal limits. No mass or hydronephrosis visualized. Left Kidney: Length: 10.1 cm. Echogenicity within normal limits. Central sinus cysts identified today, similar to prior CT. Abdominal aorta: No aneurysm  visualized. Other findings: None. IMPRESSION: 1. No acute findings to explain the patient's history of back and abdominal pain. 2. Echogenic liver parenchyma suggests steatosis. 3. Central sinus cysts left kidney, similar to prior CT. Electronically Signed   By: Misty Stanley M.D.   On: 11/26/2019 14:14       Assessment & Plan:   Problem List Items Addressed This Visit    Chest pain    Just saw cardiology.  Felt to be related to GERD.  Abdominal ultrasound as outlined.  No further pain.  On omeprazole.  Follow.       Fatty liver    Found on abdominal ultrasound.  Refer to GI for further w/up and evaluation.        Hematuria    In reviewing, needs f/u urine to confirm no red blood cells.        Hyperbilirubinemia    Recent bilirubin level wnl.       Hypercalcemia    Recent calcium level wnl.       Hypercholesterolemia    On simvastatin.  Low cholesterol diet and exercise.  Follow lipid panel and liver function tests.        Relevant Orders   Lipid panel   Hepatic function panel   Hypertension    Blood pressure as outlined.  Lisinopril just increased to 40mg  q day.  Follow pressures.  Continues on aldactone and coreg.       Relevant Orders   Basic metabolic panel   Hypothyroidism    On thyroid replacement.  Follow tsh.        Relevant Orders   TSH       Einar Pheasant, MD

## 2019-12-26 ENCOUNTER — Encounter: Payer: Self-pay | Admitting: Internal Medicine

## 2019-12-28 ENCOUNTER — Encounter: Payer: Self-pay | Admitting: Internal Medicine

## 2019-12-28 DIAGNOSIS — K760 Fatty (change of) liver, not elsewhere classified: Secondary | ICD-10-CM | POA: Insufficient documentation

## 2019-12-28 NOTE — Assessment & Plan Note (Signed)
On thyroid replacement.  Follow tsh.  

## 2019-12-28 NOTE — Assessment & Plan Note (Signed)
Recent calcium level wnl.

## 2019-12-28 NOTE — Assessment & Plan Note (Signed)
On simvastatin.  Low cholesterol diet and exercise.  Follow lipid panel and liver function tests.   

## 2019-12-28 NOTE — Assessment & Plan Note (Signed)
Just saw cardiology.  Felt to be related to GERD.  Abdominal ultrasound as outlined.  No further pain.  On omeprazole.  Follow.

## 2019-12-28 NOTE — Assessment & Plan Note (Signed)
Found on abdominal ultrasound.  Refer to GI for further w/up and evaluation.

## 2019-12-28 NOTE — Assessment & Plan Note (Signed)
Blood pressure as outlined.  Lisinopril just increased to 40mg  q day.  Follow pressures.  Continues on aldactone and coreg.

## 2019-12-28 NOTE — Assessment & Plan Note (Signed)
In reviewing, needs f/u urine to confirm no red blood cells.

## 2019-12-28 NOTE — Assessment & Plan Note (Signed)
Recent bilirubin level wnl.

## 2020-01-05 ENCOUNTER — Telehealth: Payer: Self-pay | Admitting: Internal Medicine

## 2020-01-05 MED ORDER — OMEPRAZOLE 20 MG PO CPDR: 20.0000 mg | DELAYED_RELEASE_CAPSULE | Freq: Two times a day (BID) | ORAL | 1 refills | Status: DC

## 2020-01-05 MED ORDER — SIMVASTATIN 10 MG PO TABS: 10.0000 mg | ORAL_TABLET | Freq: Every day | ORAL | 4 refills | Status: DC

## 2020-01-05 NOTE — Telephone Encounter (Signed)
Patient needs the following medication refilled; omeprazole (PRILOSEC) 20 MG capsule and simvastatin (ZOCOR) 10 MG tablet, both need to be 90 day supply and send to mail order pharmacy.

## 2020-01-21 ENCOUNTER — Telehealth: Payer: Self-pay | Admitting: Internal Medicine

## 2020-01-21 MED ORDER — OMEPRAZOLE 20 MG PO CPDR: 20.0000 mg | DELAYED_RELEASE_CAPSULE | Freq: Two times a day (BID) | ORAL | 1 refills | Status: DC

## 2020-01-21 NOTE — Telephone Encounter (Signed)
Pt needs refill to [facility] [address]

## 2020-01-21 NOTE — Addendum Note (Signed)
Addended by: Elpidio Galea T on: 01/21/2020 10:33 AM   Modules accepted: Orders

## 2020-01-21 NOTE — Telephone Encounter (Signed)
Ok to change rx to once a day for omeprazole?

## 2020-01-21 NOTE — Telephone Encounter (Signed)
Pt called and said that insurance will not cover the Rx wrote for two a day. She is only taking one a day  Rx needs to be changed

## 2020-01-21 NOTE — Telephone Encounter (Signed)
Pt  Called and said that the Mail order never received the rx for omeprazole (PRILOSEC) 20 MG capsule and [facility] would [facility] [address]

## 2020-01-21 NOTE — Addendum Note (Signed)
Addended byElpidio Galea T on: 01/21/2020 02:29 PM   Modules accepted: Orders

## 2020-01-21 NOTE — Telephone Encounter (Signed)
ERROR

## 2020-01-21 NOTE — Telephone Encounter (Signed)
Medication has been sent a second time.

## 2020-01-21 NOTE — Telephone Encounter (Signed)
[facility]

## 2020-01-22 MED ORDER — OMEPRAZOLE 20 MG PO CPDR: 20.0000 mg | DELAYED_RELEASE_CAPSULE | Freq: Every day | ORAL | 1 refills | Status: DC

## 2020-01-22 NOTE — Telephone Encounter (Signed)
Detailed message left with husband. Advised to let us know if they need anything.

## 2020-01-22 NOTE — Telephone Encounter (Signed)
Please notify pt that her omeprazole has been sent to [facility] for q day.  Sorry for the confusion.

## 2020-01-22 NOTE — Addendum Note (Signed)
Addended by: Alisa Graff on: 01/22/2020 04:28 AM   Modules accepted: Orders

## 2020-01-26 ENCOUNTER — Other Ambulatory Visit: Payer: Self-pay

## 2020-01-26 MED ORDER — OMEPRAZOLE 20 MG PO CPDR: 20.0000 mg | DELAYED_RELEASE_CAPSULE | Freq: Every day | ORAL | 1 refills | Status: DC

## 2020-01-26 NOTE — Telephone Encounter (Signed)
Pt called back and said it needs to [facility] because [facility] [address] [facility] one who told her they wouldn't fill the prescription. Please call patient back.

## 2020-01-26 NOTE — Telephone Encounter (Signed)
Called patient and advised of below. Patient stated she would prefer to get locally. Advised that I sent over new script and will [facility]. Pt agreed.

## 2020-01-26 NOTE — Telephone Encounter (Signed)
[facility] and stated [facility] for omeprazole 20 mg q day. Pharmacy stated that they did not have script for that. Sent over new rx and advised to let me know if anything more was needed. Left message for patient.

## 2020-01-26 NOTE — Telephone Encounter (Signed)
Pt cant get the refill because she needs a prior authorization done. She said they are supposed to be faxing it over to be done.

## 2020-01-28 NOTE — Telephone Encounter (Signed)
Pt is going to get medication OTC with good rx card. Still have not received anything from [facility]

## 2020-02-05 ENCOUNTER — Other Ambulatory Visit: Payer: Self-pay | Admitting: *Deleted

## 2020-02-05 MED ORDER — SPIRONOLACTONE 25 MG PO TABS: 25.0000 mg | ORAL_TABLET | Freq: Every day | ORAL | 0 refills | Status: DC

## 2020-02-05 NOTE — Telephone Encounter (Signed)
Requested Prescriptions   Signed Prescriptions Disp Refills  . spironolactone (ALDACTONE) 25 MG tablet 90 tablet 0    Sig: Take 1 tablet (25 mg total) by mouth daily.    Authorizing Provider: Kathlyn Sacramento A    Ordering User: Britt Bottom

## 2020-02-17 ENCOUNTER — Telehealth: Payer: Self-pay | Admitting: Internal Medicine

## 2020-02-17 ENCOUNTER — Other Ambulatory Visit: Payer: Self-pay

## 2020-02-17 MED ORDER — LEVOTHYROXINE SODIUM 88 MCG PO TABS: 88.0000 ug | ORAL_TABLET | Freq: Every day | ORAL | 4 refills | Status: DC

## 2020-02-17 NOTE — Telephone Encounter (Signed)
Pt needs a refill on levothyroxine (SYNTHROID, LEVOTHROID) 88 MCG tablet and [facility]

## 2020-02-17 NOTE — Telephone Encounter (Signed)
Rx refilled.

## 2020-03-01 IMAGING — CT CT ABD-PELV W/ CM
2 of 5 series · 15 of 46 positions shown, 17 images · IV contrast (APPLIED)
Comparison: CT AP [DATE]

CLINICAL DATA: Abdominal pain. Intermittent right lower quadrant
pain for 2-3 years. Night sweats.

EXAM:
CT ABDOMEN AND PELVIS WITH CONTRAST
TECHNIQUE: Multidetector CT imaging of the abdomen and pelvis was performed
using the standard protocol following bolus administration of
intravenous contrast.
CONTRAST:  80mL 5UTAT2-OXM IOPAMIDOL (5UTAT2-OXM) INJECTION 76%

[Series 2: routine abd/pel with · axial · 0.65mm/px · z∈[-980,-635]mm · 12 of 79 slices shown, 14 images]
[im 5/79  soft-tissue]
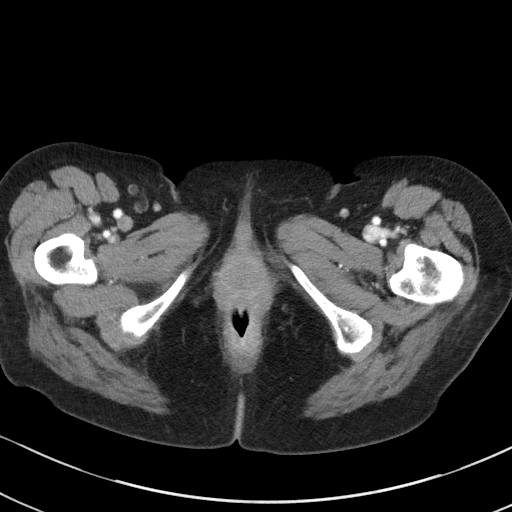
[im 5/79  bone]
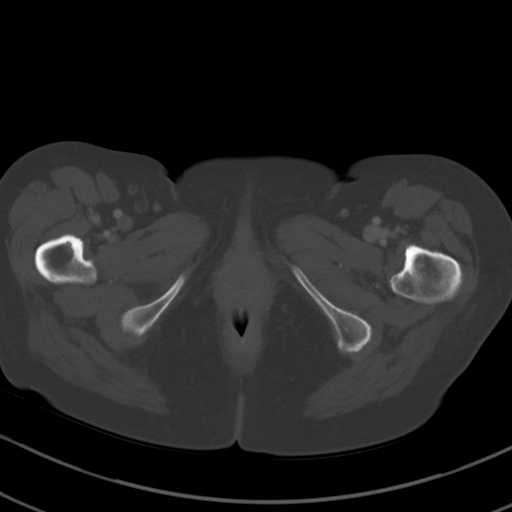
[im 13/79  soft-tissue]
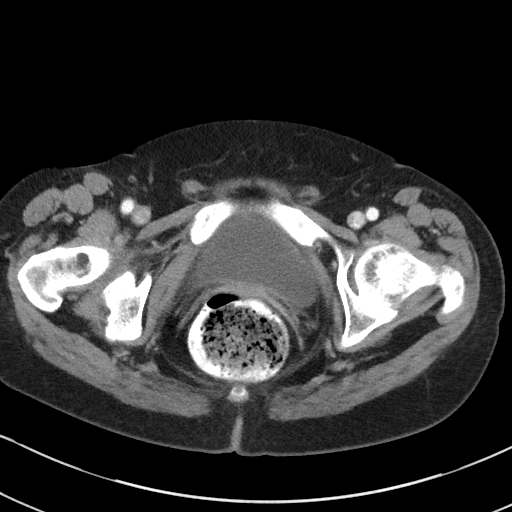
[im 17/79  soft-tissue]
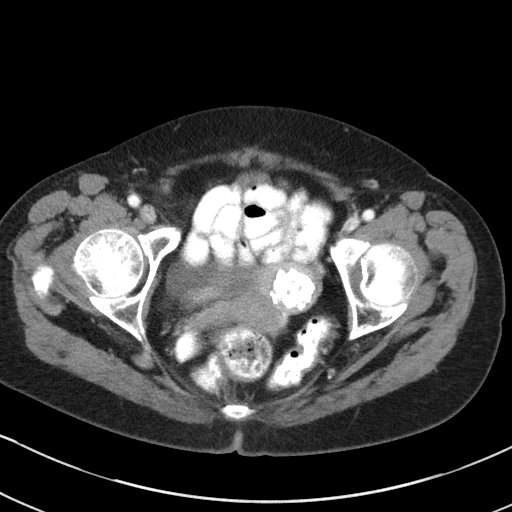
[im 25/79  soft-tissue]
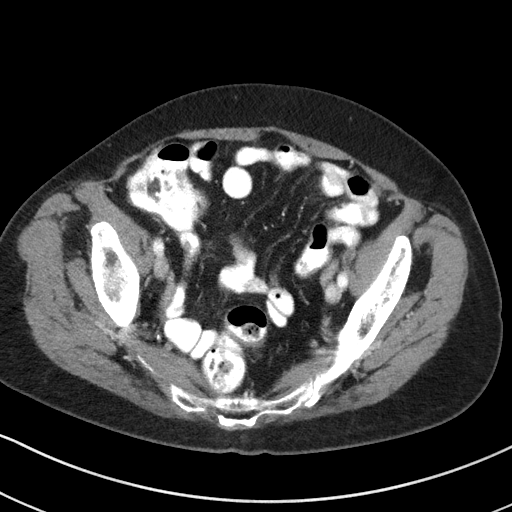
[im 29/79  soft-tissue]
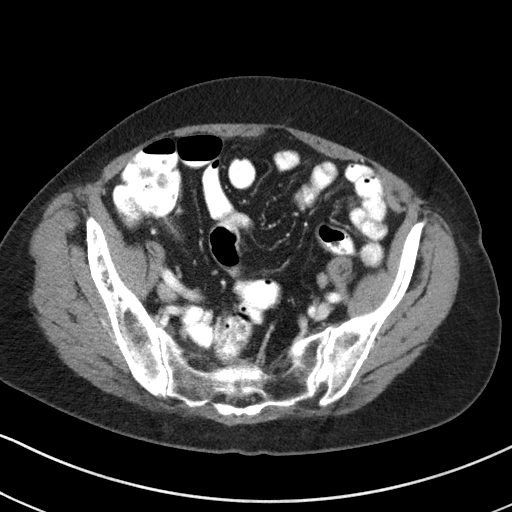
[im 37/79  soft-tissue]
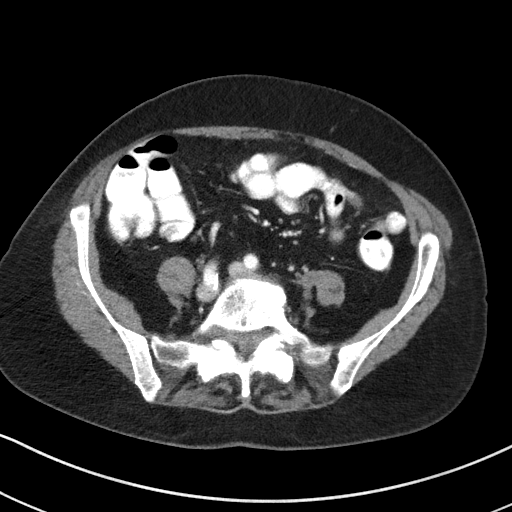
[im 42/79  soft-tissue]
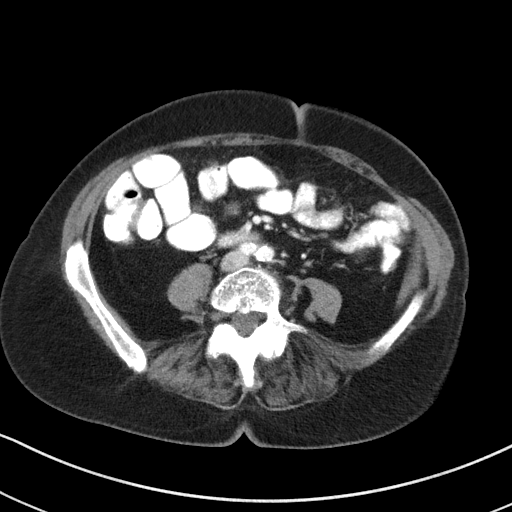
[im 50/79  soft-tissue]
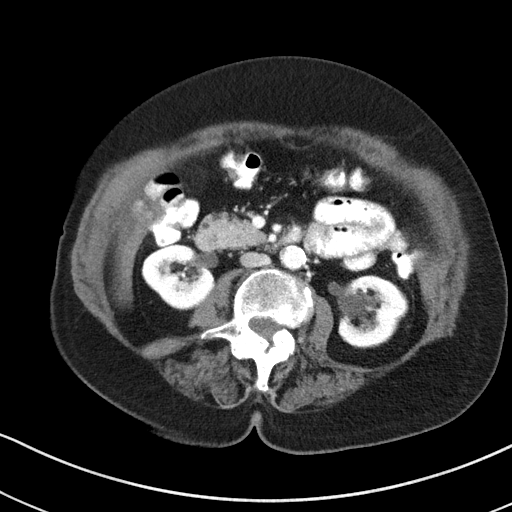
[im 54/79  soft-tissue]
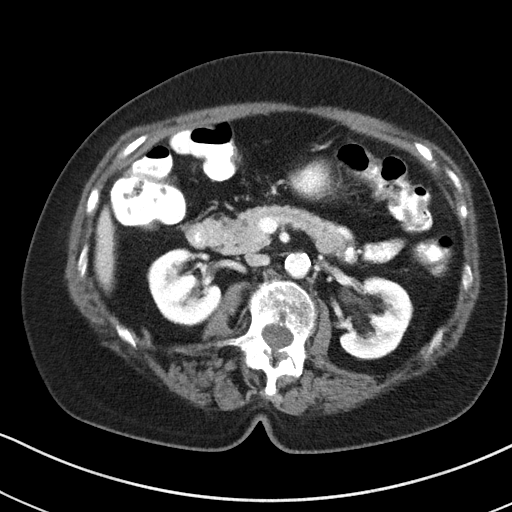
[im 54/79  bone]
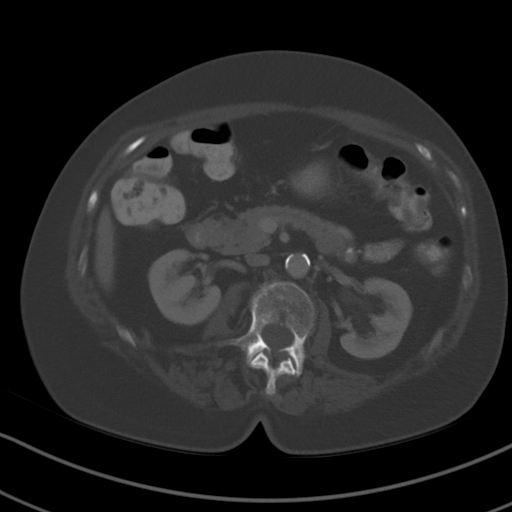
[im 62/79  soft-tissue]
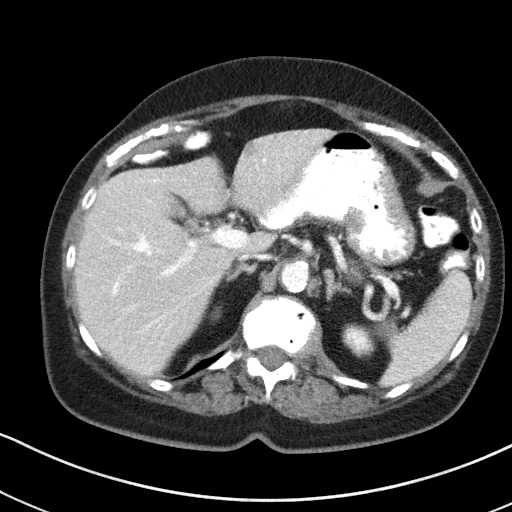
[im 66/79  soft-tissue]
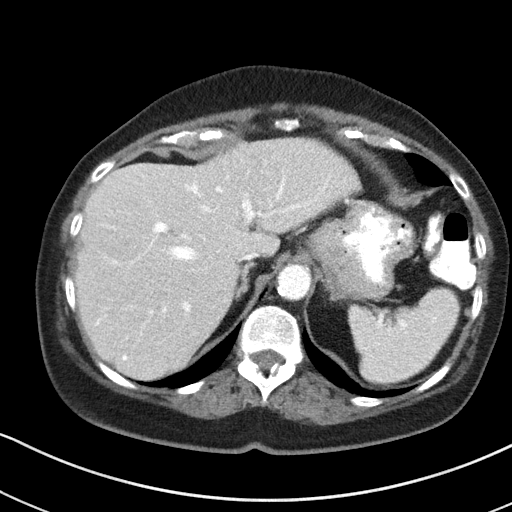
[im 74/79  soft-tissue]
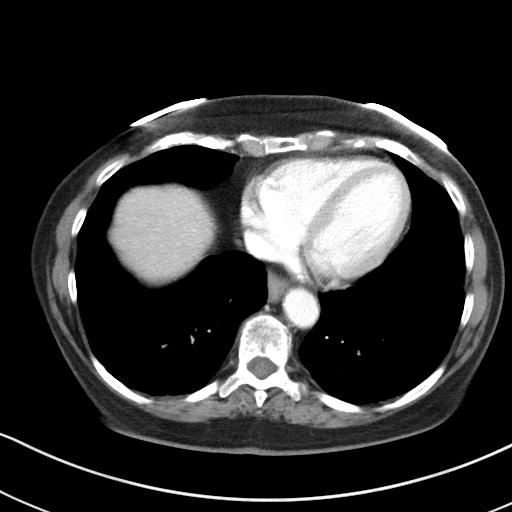

[Series 5: coronal st · coronal · 0.69mm/px · 3 of 86 slices shown]
[im 29/86  soft-tissue]
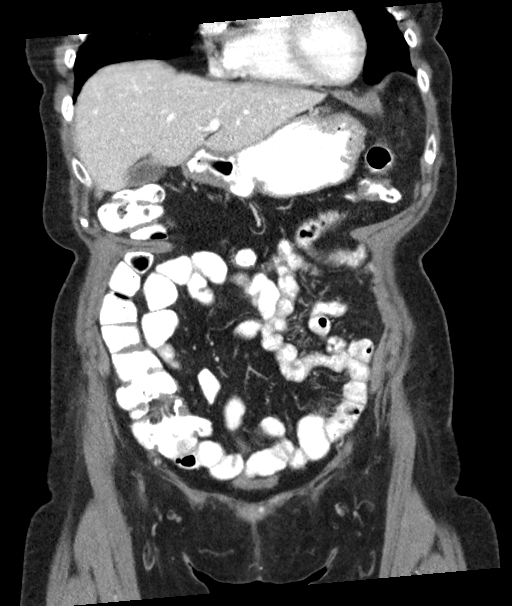
[im 38/86  soft-tissue]
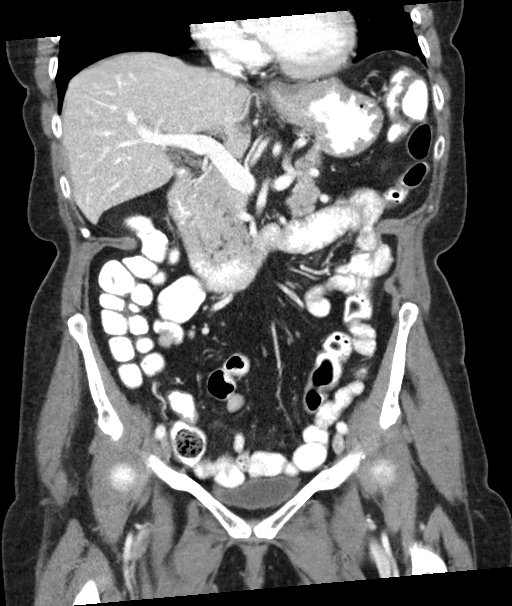
[im 48/86  soft-tissue]
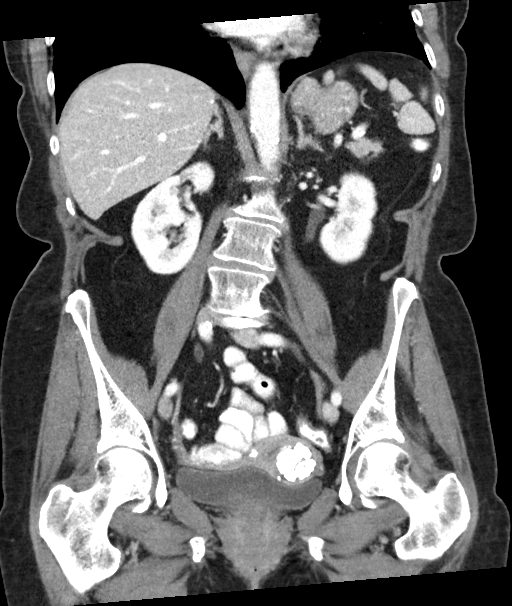

[15 of 46 positions shown; findings below may reference images not displayed]

FINDINGS: Lower chest: No acute abnormality.

Hepatobiliary: No focal liver abnormality is seen. No gallstones,
gallbladder wall thickening, or biliary dilatation.

Pancreas: No inflammation or main duct dilatation identified. Within
the uncinate process of the pancreas there is an ill-defined area of
low attenuation measuring 1.1 cm, image 40/5. Within the neck of
pancreas there is a small cystic lesion measuring 5 mm which appears
new from the previous exam. The body and tail of pancreas are
unremarkable.

Spleen: Normal appearance of the spleen.

Adrenals/Urinary Tract: Normal adrenal glands.

Right kidney appears normal. Inferior pole left kidney parapelvic
cyst measures 1.7 cm. No mass or hydronephrosis identified. The
urinary bladder appears normal.

Stomach/Bowel: Small hiatal hernia. No abnormal distension of the
stomach. The small bowel loops have a normal course and caliber. No
obstruction. No pathologic dilatation of the:.

Vascular/Lymphatic: Aortic atherosclerosis. No aneurysm. No
abdominal or pelvic adenopathy identified.

Reproductive: Left-sided calcified fundal fibroid identified within
the uterus. There is a simple appearing cyst within the right ovary
measuring 2.9 cm, image 59/2. New from previous exam.

Other: No free fluid or fluid collections.

Musculoskeletal: Scoliosis and degenerative disc disease identified
within the lumbar spine.
IMPRESSION: 1. No acute findings identified within the abdomen or pelvis.
2. Ill-defined area of low attenuation within the head of pancreas
is identified and is indeterminate. Further investigation with
pancreas protocol contrast enhanced MRI is advised.
3. 7 mm cystic lesion within neck of pancreas is also new from
previous exam. This can be better assessed at MRI.
4. Calcified uterine fibroid
5. Right ovary cyst measures 2.9 cm. This is almost certainly
benign, but follow up ultrasound is recommended in 1 year according
to the Society of Radiologists in [id number] Consensus
Conference Statement (Maitri Forte et al. Management of Asymptomatic
Ovarian and Other Adnexal Cysts Imaged at US: Society of
Radiologists in Ultrasound Consensus Conference Statement [id number].
Radiology [DATE]): [id number].).
6.  Aortic Atherosclerosis ([id number]-TTB.B).

## 2020-03-03 ENCOUNTER — Ambulatory Visit: Payer: Medicare Other

## 2020-03-03 ENCOUNTER — Other Ambulatory Visit: Payer: Self-pay

## 2020-03-03 ENCOUNTER — Ambulatory Visit (INDEPENDENT_AMBULATORY_CARE_PROVIDER_SITE_OTHER): Payer: Medicare Other | Admitting: Internal Medicine

## 2020-03-03 VITALS — BP 128/70 | HR 65 | Temp 96.3°F | Resp 16 | Ht 62.0 in | Wt 133.6 lb

## 2020-03-03 DIAGNOSIS — Z1231 Encounter for screening mammogram for malignant neoplasm of breast: Secondary | ICD-10-CM

## 2020-03-03 DIAGNOSIS — E7800 Pure hypercholesterolemia, unspecified: Secondary | ICD-10-CM | POA: Diagnosis not present

## 2020-03-03 DIAGNOSIS — I10 Essential (primary) hypertension: Secondary | ICD-10-CM

## 2020-03-03 DIAGNOSIS — K760 Fatty (change of) liver, not elsewhere classified: Secondary | ICD-10-CM | POA: Diagnosis not present

## 2020-03-03 DIAGNOSIS — E039 Hypothyroidism, unspecified: Secondary | ICD-10-CM

## 2020-03-03 DIAGNOSIS — W19XXXD Unspecified fall, subsequent encounter: Secondary | ICD-10-CM

## 2020-03-03 MED ORDER — OMEPRAZOLE 20 MG PO CPDR: 20.0000 mg | DELAYED_RELEASE_CAPSULE | Freq: Every day | ORAL | 1 refills | Status: DC

## 2020-03-03 NOTE — Progress Notes (Addendum)
Patient ID: Crystal Lynch, female   DOB: 31-Mar-1941, 79 y.o.   MRN: [id number]   Subjective:    Patient ID: Crystal Lynch, female    DOB: 05/09/1941, 79 y.o.   MRN: [id number]  HPI This visit occurred during the SARS-CoV-2 public health emergency.  Safety protocols were in place, including screening questions prior to the visit, additional usage of staff PPE, and extensive cleaning of exam room while observing appropriate contact time as indicated for disinfecting solutions.  Patient here for a scheduled follow up.  She reports she is doing relatively well.  She did fall two weeks ago. Was looking out the window and had one foot on a chair.  Lost her balance.  Hit back of head on rounded granite counter top.  No LOC. Was evaluated at [facility].  Staples placed and now removed.  Healing well.  No residual headache.  No dizziness or light headedness. No other falls.   No chest pain or sob reported.  No abdominal pain.  Overall she feels she is doing well.    Past Medical History:  Diagnosis Date  . Arthritis   . Chicken pox   . Endometriosis   . Esophagitis    s/p esophageal dilatation  . GERD (gastroesophageal reflux disease)   . History of ovarian cyst   . Hypercholesterolemia   . Hypertension   . Hypothyroidism    Past Surgical History:  Procedure Laterality Date  . ABDOMINAL SURGERY     found to have an ovarian cyst and endometriosis  . APPENDECTOMY     Family History  Problem Relation Age of Onset  . Lung disease Father   . Heart attack Father   . Heart disease Mother        myocardial infarction  . Breast cancer Mother 26  . Heart attack Mother   . Hypertension Mother   . Hyperlipidemia Mother   . Breast cancer Sister 54  . Hypertension Sister   . Rheum arthritis Sister   . Breast cancer Maternal Aunt 14  . Hypertension Brother   . Colon cancer Neg Hx    Social History   Socioeconomic History  . Marital status: Married    Spouse name: Not on file  . Number of  children: Not on file  . Years of education: Not on file  . Highest education level: Not on file  Occupational History  . Not on file  Tobacco Use  . Smoking status: Never Smoker  . Smokeless tobacco: Never Used  Substance and Sexual Activity  . Alcohol use: No    Alcohol/week: 0.0 standard drinks  . Drug use: No  . Sexual activity: Never  Other Topics Concern  . Not on file  Social History Narrative  . Not on file   Social Determinants of Health   Financial Resource Strain:   . Difficulty of Paying Living Expenses:   Food Insecurity:   . Worried About Charity fundraiser in the Last Year:   . Arboriculturist in the Last Year:   Transportation Needs:   . Film/video editor (Medical):   Marland Kitchen Lack of Transportation (Non-Medical):   Physical Activity:   . Days of Exercise per Week:   . Minutes of Exercise per Session:   Stress:   . Feeling of Stress :   Social Connections:   . Frequency of Communication with Friends and Family:   . Frequency of Social Gatherings with Friends and Family:   .  Attends Religious Services:   . Active Member of Clubs or Organizations:   . Attends Archivist Meetings:   Marland Kitchen Marital Status:     Outpatient Encounter Medications as of 03/03/2020  Medication Sig  . Biotin 5000 MCG CAPS Take by mouth.  . carvedilol (COREG) 6.25 MG tablet Take 1 tablet (6.25 mg total) by mouth 2 (two) times daily.  . Cholecalciferol (VITAMIN D3 PO) Take by mouth daily.  Marland Kitchen levothyroxine (SYNTHROID) 88 MCG tablet Take 1 tablet (88 mcg total) by mouth daily.  Marland Kitchen lisinopril (ZESTRIL) 40 MG tablet Take 1 tablet (40 mg total) by mouth daily.  . Multiple Vitamins-Minerals (ZINC PO) Take by mouth daily.  Marland Kitchen omeprazole (PRILOSEC) 20 MG capsule Take 1 capsule (20 mg total) by mouth daily.  . simvastatin (ZOCOR) 10 MG tablet Take 1 tablet (10 mg total) by mouth at bedtime.  Marland Kitchen spironolactone (ALDACTONE) 25 MG tablet Take 1 tablet (25 mg total) by mouth daily.  .  [DISCONTINUED] omeprazole (PRILOSEC) 20 MG capsule Take 1 capsule (20 mg total) by mouth daily.   No facility-administered encounter medications on file as of 03/03/2020.    Review of Systems  Constitutional: Negative for appetite change and unexpected weight change.  HENT: Negative for congestion and sinus pressure.   Respiratory: Negative for cough, chest tightness and shortness of breath.   Cardiovascular: Negative for chest pain, palpitations and leg swelling.  Gastrointestinal: Negative for abdominal pain, diarrhea, nausea and vomiting.  Genitourinary: Negative for difficulty urinating and dysuria.  Musculoskeletal: Negative for joint swelling and myalgias.  Skin: Negative for color change and rash.  Neurological: Negative for dizziness, light-headedness and headaches.  Psychiatric/Behavioral: Negative for agitation and dysphoric mood.       Objective:    Physical Exam Vitals reviewed.  Constitutional:      General: She is not in acute distress.    Appearance: Normal appearance.  HENT:     Head: Normocephalic and atraumatic.     Right Ear: External ear normal.     Left Ear: External ear normal.  Eyes:     General: No scleral icterus.       Right eye: No discharge.        Left eye: No discharge.     Conjunctiva/sclera: Conjunctivae normal.  Neck:     Thyroid: No thyromegaly.  Cardiovascular:     Rate and Rhythm: Normal rate and regular rhythm.  Pulmonary:     Effort: No respiratory distress.     Breath sounds: Normal breath sounds. No wheezing.  Abdominal:     General: Bowel sounds are normal.     Palpations: Abdomen is soft.     Tenderness: There is no abdominal tenderness.  Musculoskeletal:        General: No swelling or tenderness.     Cervical back: Neck supple. No tenderness.  Lymphadenopathy:     Cervical: No cervical adenopathy.  Skin:    Findings: No erythema or rash.  Neurological:     Mental Status: She is alert.  Psychiatric:        Mood and  Affect: Mood normal.        Behavior: Behavior normal.     BP 128/70   Pulse 65   Temp (!) 96.3 F (35.7 C)   Resp 16   Ht 5\' 2"  (1.575 m)   Wt 133 lb 9.6 oz (60.6 kg)   LMP 10/21/1984   SpO2 98%   BMI 24.44 kg/m  Wt Readings  from Last 3 Encounters:  03/05/20 133 lb (60.3 kg)  03/03/20 133 lb 9.6 oz (60.6 kg)  12/22/19 138 lb 9.6 oz (62.9 kg)     Lab Results  Component Value Date   WBC 4.3 12/18/2019   HGB 13.8 12/18/2019   HCT 41.8 12/18/2019   PLT 227.0 12/18/2019   GLUCOSE 96 12/18/2019   CHOL 162 12/18/2019   TRIG 194.0 (H) 12/18/2019   HDL 41.10 12/18/2019   LDLDIRECT 94.0 01/04/2017   LDLCALC 82 12/18/2019   ALT 19 12/18/2019   AST 17 12/18/2019   NA 139 12/18/2019   K 3.8 12/18/2019   CL 104 12/18/2019   CREATININE 0.89 12/18/2019   BUN 16 12/18/2019   CO2 28 12/18/2019   TSH 0.67 12/24/2018   INR 1.0 08/12/2015    US Abdomen Complete  Result Date: 11/26/2019 CLINICAL DATA:  Back and abdominal pain. EXAM: ABDOMEN ULTRASOUND COMPLETE COMPARISON:  CT scan 05/08/2018. FINDINGS: Gallbladder: No gallstones or gallbladder wall thickening. No pericholecystic fluid. The sonographer reports no sonographic Murphy's sign. Common bile duct: Diameter: 3 mm Liver: Mild coarsening of the liver parenchyma shows increased echogenicity with poor acoustic through transmission, imaging features suggesting fatty deposition within the parenchyma. No focal abnormality by ultrasound. Portal vein is patent on color Doppler imaging with normal direction of blood flow towards the liver. IVC: No abnormality visualized. Pancreas: Visualized portion unremarkable. Spleen: Size and appearance within normal limits. Right Kidney: Length: 9.4 cm. Echogenicity within normal limits. No mass or hydronephrosis visualized. Left Kidney: Length: 10.1 cm. Echogenicity within normal limits. Central sinus cysts identified today, similar to prior CT. Abdominal aorta: No aneurysm visualized. Other findings:  None. IMPRESSION: 1. No acute findings to explain the patient's history of back and abdominal pain. 2. Echogenic liver parenchyma suggests steatosis. 3. Central sinus cysts left kidney, similar to prior CT. Electronically Signed   By: Misty Stanley M.D.   On: 11/26/2019 14:14       Assessment & Plan:   Problem List Items Addressed This Visit    Fall    Recent fall as outlined.  Head laceration.  Staples removed.  Healing well.  No residual headache or dizziness.  Follow. Discussed tetanus booster.  Will get at pharmacy.       Fatty liver    Found on ultrasound.  Diet and exercise. Follow liver function tests.        Hypercholesterolemia    On simvastatin.  Low cholesterol diet and exercise.  Follow lipid panel and liver function tests.        Hypertension    Blood pressure as outlined.  On aldactone, zestril and coreg.  Followed by Dr Fletcher Anon.  Follow pressures.  Follow metabolic panel.       Hypothyroidism    On thyroid replacement.  Follow tsh.        Other Visit Diagnoses    Visit for screening mammogram    -  Primary   Relevant Orders   MM 3D SCREEN BREAST BILATERAL       Einar Pheasant, MD

## 2020-03-05 ENCOUNTER — Ambulatory Visit (INDEPENDENT_AMBULATORY_CARE_PROVIDER_SITE_OTHER): Payer: Medicare Other

## 2020-03-05 VITALS — Ht 62.0 in | Wt 133.0 lb

## 2020-03-05 DIAGNOSIS — Z0000 Encounter for general adult medical examination without abnormal findings: Secondary | ICD-10-CM | POA: Diagnosis not present

## 2020-03-05 NOTE — Progress Notes (Addendum)
Subjective:   Crystal Lynch is a 79 y.o. female who presents for Medicare Annual (Subsequent) preventive examination.  Review of Systems:  No ROS.  Medicare Wellness Virtual Visit.  Visual/audio telehealth visit, UTA vital signs.   Ht/Wt provided. See social history for additional risk factors.   Cardiac Risk Factors include: advanced age (>12men, >76 women);hypertension     Objective:     Vitals: Ht 5\' 2"  (1.575 m)   Wt 133 lb (60.3 kg)   LMP 10/21/1984   BMI 24.33 kg/m   Body mass index is 24.33 kg/m.  Advanced Directives 03/05/2020 03/03/2019 03/01/2018 09/04/2017 02/28/2017 08/18/2015  Does Patient Have a Medical Advance Directive? Yes Yes Yes Yes Yes Yes  Type of Paramedic of [name];Living will - [name];Living will [name];Living will [name];Living will [name];Living will  Does patient want to make changes to medical advance directive? No - Patient declined No - Patient declined No - Patient declined No - Patient declined No - Patient declined No - Patient declined  Copy of [name] in Chart? No - copy requested - No - copy requested - No - copy requested No - copy requested    Tobacco Social History   Tobacco Use  Smoking Status Never Smoker  Smokeless Tobacco Never Used     Counseling given: Not Answered   Clinical Intake:  Pre-visit preparation completed: Yes        Diabetes: No  How often do you need to have someone help you when you read instructions, pamphlets, or other written materials from your doctor or pharmacy?: 1 - Never  Interpreter Needed?: No     Past Medical History:  Diagnosis Date  . Arthritis   . Chicken pox   . Endometriosis   . Esophagitis    s/p esophageal dilatation  . GERD (gastroesophageal reflux disease)   . History of ovarian cyst   . Hypercholesterolemia   . Hypertension   .  Hypothyroidism    Past Surgical History:  Procedure Laterality Date  . ABDOMINAL SURGERY     found to have an ovarian cyst and endometriosis  . APPENDECTOMY     Family History  Problem Relation Age of Onset  . Lung disease Father   . Heart attack Father   . Heart disease Mother        myocardial infarction  . Breast cancer Mother 37  . Heart attack Mother   . Hypertension Mother   . Hyperlipidemia Mother   . Breast cancer Sister 37  . Hypertension Sister   . Rheum arthritis Sister   . Breast cancer Maternal Aunt 64  . Hypertension Brother   . Colon cancer Neg Hx    Social History   Socioeconomic History  . Marital status: Married    Spouse name: Not on file  . Number of children: Not on file  . Years of education: Not on file  . Highest education level: Not on file  Occupational History  . Not on file  Tobacco Use  . Smoking status: Never Smoker  . Smokeless tobacco: Never Used  Substance and Sexual Activity  . Alcohol use: No    Alcohol/week: 0.0 standard drinks  . Drug use: No  . Sexual activity: Never  Other Topics Concern  . Not on file  Social History Narrative  . Not on file   Social Determinants of Health   Financial Resource  Strain:   . Difficulty of Paying Living Expenses:   Food Insecurity:   . Worried About Charity fundraiser in the Last Year:   . Arboriculturist in the Last Year:   Transportation Needs:   . Film/video editor (Medical):   Marland Kitchen Lack of Transportation (Non-Medical):   Physical Activity:   . Days of Exercise per Week:   . Minutes of Exercise per Session:   Stress:   . Feeling of Stress :   Social Connections:   . Frequency of Communication with Friends and Family:   . Frequency of Social Gatherings with Friends and Family:   . Attends Religious Services:   . Active Member of Clubs or Organizations:   . Attends Archivist Meetings:   Marland Kitchen Marital Status:     Outpatient Encounter Medications as of 03/05/2020    Medication Sig  . dorzolamide-timolol (COSOPT) 22.3-6.8 MG/ML ophthalmic solution Place 1 drop into both eyes 2 (two) times daily.  . Biotin 5000 MCG CAPS Take by mouth.  . carvedilol (COREG) 6.25 MG tablet Take 1 tablet (6.25 mg total) by mouth 2 (two) times daily.  . Cholecalciferol (VITAMIN D3 PO) Take by mouth daily.  Marland Kitchen levothyroxine (SYNTHROID) 88 MCG tablet Take 1 tablet (88 mcg total) by mouth daily.  Marland Kitchen lisinopril (ZESTRIL) 40 MG tablet Take 1 tablet (40 mg total) by mouth daily.  . Multiple Vitamins-Minerals (ZINC PO) Take by mouth daily.  Marland Kitchen omeprazole (PRILOSEC) 20 MG capsule Take 1 capsule (20 mg total) by mouth daily.  . simvastatin (ZOCOR) 10 MG tablet Take 1 tablet (10 mg total) by mouth at bedtime.  Marland Kitchen spironolactone (ALDACTONE) 25 MG tablet Take 1 tablet (25 mg total) by mouth daily.   No facility-administered encounter medications on file as of 03/05/2020.    Activities of Daily Living In your present state of health, do you have any difficulty performing the following activities: 03/05/2020  Hearing? N  Vision? N  Difficulty concentrating or making decisions? N  Walking or climbing stairs? N  Dressing or bathing? N  Doing errands, shopping? N  Preparing Food and eating ? N  Using the Toilet? N  In the past six months, have you accidently leaked urine? N  Do you have problems with loss of bowel control? N  Managing your Medications? N  Managing your Finances? N  Housekeeping or managing your Housekeeping? N  Some recent data might be hidden    Patient Care Team: Einar Pheasant, MD as PCP - General (Internal Medicine)    Assessment:   This is a routine wellness examination for [name].  Nurse connected with patient 03/05/20 at  9:30 AM EDT by a telephone enabled telemedicine application and verified that I am speaking with the correct person using two identifiers. Patient stated full name and DOB. Patient gave permission to continue with virtual visit. Patient's  location was at home and Nurse's location was at [place] office.   Patient is alert and oriented x3. Patient denies difficulty focusing or concentrating. Patient likes to read for brain health.  Health Maintenance Due: -Mammogram- scheduled 03/12/20 -[facility]. -Shingles vaccine -plans to complete with local pharmacy.  See completed HM at the end of note.   Eye: Visual acuity not assessed. Virtual visit. Followed by their ophthalmologist. Glaucoma suspect; drops in use. Wears glasses.   Dental: Visits every 6 months.    Hearing: Demonstrates normal hearing during visit. Cerumen cleaning every 6  months.  Safety:  Patient feels safe at home- yes Patient does have smoke detectors at home- yes Patient does wear sunscreen or protective clothing when in direct sunlight - yes Patient does wear seat belt when in a moving vehicle - yes Patient drives- yes Adequate lighting in walkways free from debris- yes Grab bars and handrails used as appropriate- yes Ambulates with an assistive device- no Cell phone on person when ambulating outside of the home-yes  Social: Alcohol intake - no     Smoking history- never   Smokers in home? none Illicit drug use? none  Medication: Taking as directed and without issues.  Pill box in use -yes  Self managed - yes   Covid-19: Precautions and sickness symptoms discussed. Wears mask, social distancing, hand hygiene as appropriate.   Activities of Daily Living Patient denies needing assistance with: household chores, feeding themselves, getting from bed to chair, getting to the toilet, bathing/showering, dressing, managing money, or preparing meals.   Discussed the importance of a healthy diet, water intake and the benefits of aerobic exercise. Physical activity- walking  Diet:  Monitors salt intake Healthy diet Water: fair intake Caffeine: minimal intake with chocolate  Other Providers Patient Care  Team: Einar Pheasant, MD as PCP - General (Internal Medicine)  Exercise Activities and Dietary recommendations Current Exercise Habits: Home exercise routine, Intensity: Mild  Goals      Patient Stated   . I want to start doing balance exercises (pt-stated)     Core/strengthening exercises       Fall Risk Fall Risk  03/05/2020 07/25/2019 03/03/2019 04/19/2018 03/01/2018  Falls in the past year? 1 0 0 No No  Number falls in past yr: 0 - - - -  Injury with Fall? 1 - - - -  Comment She lost her balance while stepping and sought medical attention with Urgent Care about a week ago. - - - -  Follow up Falls evaluation completed Falls evaluation completed - - -   Timed Get Up and Go performed: no, virtual visit  Depression Screen PHQ 2/9 Scores 03/05/2020 03/03/2020 03/03/2019 04/19/2018  PHQ - 2 Score 0 0 0 0  PHQ- 9 Score - - - -     Cognitive Function MMSE - Mini Mental State Exam 03/01/2018 02/28/2017  Orientation to time 5 5  Orientation to Place 5 5  Registration 3 3  Attention/ Calculation 5 5  Recall 3 3  Language- name 2 objects 2 2  Language- repeat 1 1  Language- follow 3 step command 3 3  Language- read & follow direction 1 1  Write a sentence 1 1  Copy design 1 1  Total score 30 30     6CIT Screen 03/05/2020 03/03/2019  What Year? 0 points 0 points  What month? 0 points 0 points  What time? 0 points 0 points  Count back from 20 - 0 points  Months in reverse 0 points 0 points  Repeat phrase 0 points 0 points  Total Score - 0    Immunization History  Administered Date(s) Administered  . Influenza, High Dose Seasonal PF 07/10/2017, 07/01/2018  . Influenza-Unspecified 06/23/2013, 07/12/2015, 07/04/2016, 06/25/2019  . PFIZER SARS-COV-2 Vaccination 10/27/2019, 11/17/2019  . Pneumococcal Conjugate-13 08/04/2014  . Pneumococcal Polysaccharide-23 02/28/2017  . Tdap 03/03/2020   Screening Tests Health Maintenance  Topic Date Due  . MAMMOGRAM  03/11/2020  .  INFLUENZA VACCINE  05/23/2020  . TETANUS/TDAP  03/03/2030  . DEXA SCAN  Completed  .  COVID-19 Vaccine  Completed  . PNA vac Low Risk Adult  Completed      Plan:   Keep all routine maintenance appointments.   Next scheduled fasting lab 06/09/20 @ 8:00  Cpe 09/07/20 @ 8:30  Medicare Attestation I have personally reviewed: The patient's medical and social history Their use of alcohol, tobacco or illicit drugs Their current medications and supplements The patient's functional ability including ADLs,fall risks, home safety risks, cognitive, and hearing and visual impairment Diet and physical activities Evidence for depression   I have reviewed and discussed with patient certain preventive protocols, quality metrics, and best practice recommendations.     Varney Biles, LPN  [id number]   Reviewed above information.  Agree with assessment and plan   Dr Nicki Reaper

## 2020-03-05 NOTE — Patient Instructions (Addendum)
  Crystal Lynch , Thank you for taking time to come for your Medicare Wellness Visit. I appreciate your ongoing commitment to your health goals. Please review the following plan we discussed and let me know if I can assist you in the future.   These are the goals we discussed: Goals      Patient Stated   . I want to start doing balance exercises (pt-stated)     Core/strengthening exercises       This is a list of the screening recommended for you and due dates:  Health Maintenance  Topic Date Due  . Mammogram  03/11/2020  . Flu Shot  05/23/2020  . Tetanus Vaccine  03/03/2030  . DEXA scan (bone density measurement)  Completed  . COVID-19 Vaccine  Completed  . Pneumonia vaccines  Completed

## 2020-03-08 ENCOUNTER — Encounter: Payer: Self-pay | Admitting: Internal Medicine

## 2020-03-08 DIAGNOSIS — W19XXXA Unspecified fall, initial encounter: Secondary | ICD-10-CM | POA: Insufficient documentation

## 2020-03-08 NOTE — Assessment & Plan Note (Signed)
On thyroid replacement.  Follow tsh.  

## 2020-03-08 NOTE — Assessment & Plan Note (Signed)
Blood pressure as outlined.  On aldactone, zestril and coreg.  Followed by Dr Fletcher Anon.  Follow pressures.  Follow metabolic panel.

## 2020-03-08 NOTE — Assessment & Plan Note (Signed)
On simvastatin.  Low cholesterol diet and exercise.  Follow lipid panel and liver function tests.   

## 2020-03-08 NOTE — Assessment & Plan Note (Addendum)
Recent fall as outlined.  Head laceration.  Staples removed.  Healing well.  No residual headache or dizziness.  Follow. Discussed tetanus booster.  Will get at pharmacy.

## 2020-03-08 NOTE — Assessment & Plan Note (Signed)
Found on ultrasound.  Diet and exercise.  Follow liver function tests.   

## 2020-03-09 NOTE — Telephone Encounter (Signed)
[facility]ed [facility]. Awaiting fax to complete.

## 2020-03-09 NOTE — Telephone Encounter (Signed)
Pt called and said that she received a letter stating that a PA need to done for Omeprazole

## 2020-03-12 ENCOUNTER — Ambulatory Visit
Admission: RE | Admit: 2020-03-12 | Discharge: 2020-03-12 | Disposition: A | Discharge status: Home / Self Care | Payer: Medicare Other | Source: Ambulatory Visit | Attending: Internal Medicine | Admitting: Internal Medicine

## 2020-03-12 DIAGNOSIS — Z1231 Encounter for screening mammogram for malignant neoplasm of breast: Secondary | ICD-10-CM | POA: Insufficient documentation

## 2020-03-15 ENCOUNTER — Other Ambulatory Visit: Payer: Self-pay | Admitting: Cardiovascular Disease

## 2020-03-15 MED ORDER — LISINOPRIL 40 MG PO TABS: 40.0000 mg | ORAL_TABLET | Freq: Every day | ORAL | 0 refills | Status: DC

## 2020-03-15 NOTE — Telephone Encounter (Signed)
*  STAT* If patient is at the pharmacy, call can be transferred to refill team.   1. Which medications need to be refilled? (please list name of each medication and dose if known) lisinopril 40 mg daily  2. Which pharmacy/location (including street and city if local pharmacy) is medication to be sent to? [facility]  3. Do they need a 30 day or 90 day supply? [place]

## 2020-03-15 NOTE — Telephone Encounter (Signed)
Requested Prescriptions   Signed Prescriptions Disp Refills   lisinopril (ZESTRIL) 40 MG tablet 90 tablet 0    Sig: Take 1 tablet (40 mg total) by mouth daily.    Authorizing Provider: Kathlyn Sacramento A    Ordering User: Raelene Bott, Andron Marrazzo L

## 2020-03-18 ENCOUNTER — Telehealth: Payer: Self-pay | Admitting: Internal Medicine

## 2020-03-18 NOTE — Telephone Encounter (Signed)
See other note

## 2020-03-18 NOTE — Telephone Encounter (Signed)
Crystal Lynch with [facility] called and states that PPI can only be 90 every 365 days unless you call to get PA request for daily use. Please call at [phone]

## 2020-04-01 NOTE — Telephone Encounter (Signed)
Pt aware.

## 2020-04-01 NOTE — Telephone Encounter (Signed)
PA approved for omeprazole through the next 12 months. Approval number: [id number]

## 2020-05-05 ENCOUNTER — Other Ambulatory Visit: Payer: Self-pay | Admitting: Cardiovascular Disease

## 2020-05-05 NOTE — Telephone Encounter (Signed)
Please schedule 6 month F/U with Dr. Arida or APP. Thank you! 

## 2020-05-06 NOTE — Telephone Encounter (Signed)
LMOV  

## 2020-05-09 ENCOUNTER — Other Ambulatory Visit: Payer: Self-pay | Admitting: Cardiovascular Disease

## 2020-05-20 ENCOUNTER — Other Ambulatory Visit: Payer: Self-pay

## 2020-05-20 ENCOUNTER — Ambulatory Visit (INDEPENDENT_AMBULATORY_CARE_PROVIDER_SITE_OTHER): Payer: Medicare Other | Admitting: Physician Assistant

## 2020-05-20 ENCOUNTER — Encounter: Payer: Self-pay | Admitting: Physician Assistant

## 2020-05-20 VITALS — BP 132/78 | HR 66 | Ht 62.0 in | Wt 131.5 lb

## 2020-05-20 DIAGNOSIS — R42 Dizziness and giddiness: Secondary | ICD-10-CM

## 2020-05-20 DIAGNOSIS — E785 Hyperlipidemia, unspecified: Secondary | ICD-10-CM | POA: Diagnosis not present

## 2020-05-20 DIAGNOSIS — I10 Essential (primary) hypertension: Secondary | ICD-10-CM

## 2020-05-20 DIAGNOSIS — K209 Esophagitis, unspecified without bleeding: Secondary | ICD-10-CM

## 2020-05-20 MED ORDER — LISINOPRIL 40 MG PO TABS: 40.0000 mg | ORAL_TABLET | Freq: Every day | ORAL | 3 refills | Status: DC

## 2020-05-20 MED ORDER — SPIRONOLACTONE 25 MG PO TABS: 25.0000 mg | ORAL_TABLET | Freq: Every day | ORAL | 3 refills | Status: DC

## 2020-05-20 MED ORDER — CARVEDILOL 6.25 MG PO TABS: 6.2500 mg | ORAL_TABLET | Freq: Two times a day (BID) | ORAL | 3 refills | Status: DC

## 2020-05-20 NOTE — Patient Instructions (Signed)
Medication Instructions:  - Your physician recommends that you continue on your current medications as directed. Please refer to the Current Medication list given to you today.  *If you need a refill on your cardiac medications before your next appointment, please call your pharmacy*   Lab Work: - none ordered  If you have labs (blood work) drawn today and your tests are completely normal, you will receive your results only by: . MyChart Message (if you have MyChart) OR . A paper copy in the mail If you have any lab test that is abnormal or we need to change your treatment, we will call you to review the results.   Testing/Procedures: - none ordered   Follow-Up: At [facility], you and your health needs are our priority.  As part of our continuing mission to provide you with exceptional heart care, we have created designated Provider Care Teams.  These Care Teams include your primary Cardiologist (physician) and Advanced Practice Providers (APPs -  Physician Assistants and Nurse Practitioners) who all work together to provide you with the care you need, when you need it.  We recommend signing up for the patient portal called "MyChart".  Sign up information is provided on this After Visit Summary.  MyChart is used to connect with patients for Virtual Visits (Telemedicine).  Patients are able to view lab/test results, encounter notes, upcoming appointments, etc.  Non-urgent messages can be sent to your provider as well.   To learn more about what you can do with MyChart, go to [email or web address].    Your next appointment:   6 month(s)  The format for your next appointment:   In Person  Provider:    You may see Muhammad Arida, MD or one of the following Advanced Practice Providers on your designated Care Team:    Christopher Berge, NP  Ryan Dunn, PA-C  Jacquelyn Visser, PA-C    Other Instructions - n/a  

## 2020-05-20 NOTE — Progress Notes (Signed)
Office Visit    Patient Name: Crystal Lynch Date of Encounter: 05/20/2020  Primary Care Provider:  Einar Pheasant, MD Primary Cardiologist:  No primary care provider on file.  Chief Complaint    Chief Complaint  Patient presents with  . OTHER    6 month f/u no complaints today. Meds reviewed verbally with pt.    79 year old female who presents today for 21-month follow-up regarding refractory hypertension.  She has no previous cardiac history.  Past Medical History    Past Medical History:  Diagnosis Date  . Arthritis   . Chicken pox   . Endometriosis   . Esophagitis    s/p esophageal dilatation  . GERD (gastroesophageal reflux disease)   . History of ovarian cyst   . Hypercholesterolemia   . Hypertension   . Hypothyroidism    Past Surgical History:  Procedure Laterality Date  . ABDOMINAL SURGERY     found to have an ovarian cyst and endometriosis  . APPENDECTOMY      Allergies  Allergies  Allergen Reactions  . Meloxicam     BP issues    History of Present Illness    Crystal Lynch is a 79 y.o. female with PMH as above.  She is seen today for follow-up of refractory hypertension.  She has no previous cardiac history.  Other medical problems include hyperlipidemia and GERD.  Renal artery angiography 07/2015 without significant RAS.  After having [place] and CABG on he is day 10/2019, she started having substernal burning sensation that radiated to her right side.  This lasted approximately 2 hours, after which time she called EMS.  BP 210/94.  EKG daily without acute ST/T changes.  Her symptoms were felt to be secondary to GERD.  No recurrent symptoms since then.  She records her BP regularly at home.  BP elevated.  When last seen in clinic, it was felt that her atypical chest pain was 2/2 GERD.  She was intending to have an abdominal ultrasound to rule out gallbladder disease.  She denied exertional symptoms.  No further cardiac evaluation was  recommended.  Lisinopril was increased to 40 mg once daily.  She was continued on simvastatin with most recent LDL 88.  Abdominal ultrasound showed echogenic liver parenchyma, suggestive of steatosis and central cyanosis of the left kidney.  She brings her blood pressure log with her today with blood pressures taken both before and after her medications.  BPP for her medication ranges SBP 130s to 150s and after her medication SBP 110s to low 100s.  DBP ranges from 70s before her medication to 49 after her medication.  She reports feeling lightheaded at lower blood pressures, specifically 106/49 with heart rate in the 70s.  She is dizzy at least once a week. She also reports feeling dizzy when she stands with recommendation for slow changes in position. Due to this lightheadedness, we have discussed monitoring her sodium intake and increasing activity as tolerated over increasing her BP medications today.  No chest pain, racing heart rate, or palpitations.  No signs or symptoms of heart failure.  EKG without significant changes.   BP 106/49, 150/71 HR 50-70   Home Medications    Prior to Admission medications   Medication Sig Start Date End Date Taking? Authorizing Provider  Biotin 5000 MCG CAPS Take by mouth.   Yes [provider]  carvedilol (COREG) 6.25 MG tablet TAKE 1 TABLET TWICE A DAY 05/10/20  Yes Wellington Hampshire, MD  Cholecalciferol (VITAMIN D3 PO) Take by mouth daily.   Yes [provider]  dorzolamide-timolol (COSOPT) 22.3-6.8 MG/ML ophthalmic solution Place 1 drop into both eyes 2 (two) times daily.   Yes [provider]  levothyroxine (SYNTHROID) 88 MCG tablet Take 1 tablet (88 mcg total) by mouth daily. 02/17/20  Yes Einar Pheasant, MD  lisinopril (ZESTRIL) 40 MG tablet TAKE 1 TABLET DAILY 05/07/20  Yes Wellington Hampshire, MD  Multiple Vitamins-Minerals (ZINC PO) Take by mouth daily.   Yes [provider]  omeprazole (PRILOSEC) 20 MG capsule Take 1  capsule (20 mg total) by mouth daily. 03/03/20  Yes Einar Pheasant, MD  simvastatin (ZOCOR) 10 MG tablet Take 1 tablet (10 mg total) by mouth at bedtime. 01/05/20  Yes Einar Pheasant, MD  spironolactone (ALDACTONE) 25 MG tablet TAKE 1 TABLET DAILY 05/07/20  Yes Wellington Hampshire, MD    Review of Systems    She denies chest pain, palpitations, dyspnea, pnd, orthopnea, n, v, syncope, edema, weight gain, or early satiety. She reports dizziness at least once per week with lower pressures and in the morning after she wakes for the day.   All other systems reviewed and are otherwise negative except as noted above.  Physical Exam    VS:  BP (!) 132/78 (BP Location: Left Arm, Patient Position: Sitting, Cuff Size: Normal)   Pulse 66   Ht 5\' 2"  (1.575 m)   Wt 131 lb 8 oz (59.6 kg)   LMP 10/21/1984   SpO2 98%   BMI 24.05 kg/m  , BMI Body mass index is 24.05 kg/m. GEN: Well nourished, well developed, in no acute distress. HEENT: normal. Neck: Supple, no JVD, carotid bruits, or masses. Cardiac: RRR, no murmurs, rubs, or gallops. No clubbing, cyanosis, edema.  Radials/DP/PT 2+ and equal bilaterally.  Respiratory:  Respirations regular and unlabored, clear to auscultation bilaterally. GI: Soft, nontender, nondistended, BS + x 4. MS: no deformity or atrophy. Skin: warm and dry, no rash. Neuro:  Strength and sensation are intact. Psych: Normal affect.  Accessory Clinical Findings    ECG personally reviewed by me today - NSR, 66bpm  - no acute changes.  VITALS Reviewed today   Temp Readings from Last 3 Encounters:  03/03/20 (!) 96.3 F (35.7 C)  12/22/19 (!) 97.1 F (36.2 C)  07/25/19 (!) 96.6 F (35.9 C) (Temporal)   BP Readings from Last 3 Encounters:  05/20/20 (!) 132/78  03/03/20 128/70  12/22/19 126/62   Pulse Readings from Last 3 Encounters:  05/20/20 66  03/03/20 65  12/22/19 73    Wt Readings from Last 3 Encounters:  05/20/20 131 lb 8 oz (59.6 kg)  03/05/20 133 lb (60.3  kg)  03/03/20 133 lb 9.6 oz (60.6 kg)     LABS  reviewed today   Lab Results  Component Value Date   WBC 4.3 12/18/2019   HGB 13.8 12/18/2019   HCT 41.8 12/18/2019   MCV 93.6 12/18/2019   PLT 227.0 12/18/2019   Lab Results  Component Value Date   CREATININE 0.89 12/18/2019   BUN 16 12/18/2019   NA 139 12/18/2019   K 3.8 12/18/2019   CL 104 12/18/2019   CO2 28 12/18/2019   Lab Results  Component Value Date   ALT 19 12/18/2019   AST 17 12/18/2019   ALKPHOS 70 12/18/2019   BILITOT 1.0 12/18/2019   Lab Results  Component Value Date   CHOL 162 12/18/2019   HDL 41.10 12/18/2019  [place] 82 12/18/2019   LDLDIRECT 94.0 01/04/2017   TRIG 194.0 (H) 12/18/2019   CHOLHDL 4 12/18/2019    No results found for: HGBA1C Lab Results  Component Value Date   TSH 0.67 12/24/2018     STUDIES/PROCEDURES reviewed today   PV cath 07/2015 Conclusions:  No evidence of significant renal artery stenosis. The left renal artery is extremely tortuous in the mid and distal segment which was likely the cause of elevated velocity. Recommendations:  Continue medical therapy for refractory hypertension. 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 Could consider proceeding with a renal angiogram and possible stent placement if BP remains elevated.  Assessment & Plan    Essential HTN --BP slightly elevated today with recommendation for BP 130/80 or lower. Previous RAS workup in 2016 as above. She reports feeling dizzy at home with occasional lower BP as well as in the AM; therefore, will hold off on increasing her antihypertensive regimen at this time. Slow position changes in the AM recommended. Recommend she call the office if BP consistently over 130/80.  Orthostatic hypotension --Reports dizziness in the AM with recommendation for slower position changes. Also reports lower BP at times (at least once per week),  at which time she feels dizzy.  HLD --Continue statin. Most recent LDL 82.  GERD --Continue PPI.   Disposition: RTC 6 month or sooner if needed.   Arvil Chaco, PA-C 05/20/2020

## 2020-06-09 ENCOUNTER — Other Ambulatory Visit: Payer: Self-pay

## 2020-06-09 ENCOUNTER — Other Ambulatory Visit (INDEPENDENT_AMBULATORY_CARE_PROVIDER_SITE_OTHER): Payer: Medicare Other

## 2020-06-09 DIAGNOSIS — I10 Essential (primary) hypertension: Secondary | ICD-10-CM | POA: Diagnosis not present

## 2020-06-09 DIAGNOSIS — E7800 Pure hypercholesterolemia, unspecified: Secondary | ICD-10-CM

## 2020-06-09 DIAGNOSIS — E039 Hypothyroidism, unspecified: Secondary | ICD-10-CM | POA: Diagnosis not present

## 2020-06-09 LAB — HEPATIC FUNCTION PANEL
ALT: 15 U/L (ref 0–35)
AST: 16 U/L (ref 0–37)
Albumin: 4.4 g/dL (ref 3.5–5.2)
Alkaline Phosphatase: 68 U/L (ref 39–117)
Bilirubin, Direct: 0.1 mg/dL (ref 0.0–0.3)
Total Bilirubin: 1.2 mg/dL (ref 0.2–1.2)
Total Protein: 6.8 g/dL (ref 6.0–8.3)

## 2020-06-09 LAB — BASIC METABOLIC PANEL
BUN: 13 mg/dL (ref 6–23)
CO2: 26 mEq/L (ref 19–32)
Calcium: 10.5 mg/dL (ref 8.4–10.5)
Chloride: 103 mEq/L (ref 96–112)
Creatinine, Ser: 0.76 mg/dL (ref 0.40–1.20)
GFR: 73.13 mL/min (ref >60.00)
Glucose, Bld: 96 mg/dL (ref 70–99)
Potassium: 4.4 mEq/L (ref 3.5–5.1)
Sodium: 136 mEq/L (ref 135–145)

## 2020-06-09 LAB — LIPID PANEL
Cholesterol: 163 mg/dL (ref 0–200)
HDL: 42.3 mg/dL (ref >39.00)
LDL Cholesterol: 81 mg/dL (ref 0–99)
NonHDL: 120.29
Total CHOL/HDL Ratio: 4
Triglycerides: 194 mg/dL — ABNORMAL HIGH (ref 0.0–149.0)
VLDL: 38.8 mg/dL (ref 0.0–40.0)

## 2020-06-09 LAB — TSH: TSH: 0.67 u[IU]/mL (ref 0.35–4.50)

## 2020-06-14 ENCOUNTER — Other Ambulatory Visit: Payer: Self-pay | Admitting: Internal Medicine

## 2020-06-30 ENCOUNTER — Encounter: Payer: Self-pay | Admitting: Internal Medicine

## 2020-06-30 NOTE — Telephone Encounter (Signed)
Ok to get flu shot.  If she wants antibodies checked, I can order.  Will need lab appt.  Just let me know.

## 2020-07-02 ENCOUNTER — Telehealth: Payer: Self-pay | Admitting: *Deleted

## 2020-07-02 DIAGNOSIS — Z0184 Encounter for antibody response examination: Secondary | ICD-10-CM

## 2020-07-02 NOTE — Telephone Encounter (Signed)
Please place future orders for lab appt.  

## 2020-07-03 NOTE — Telephone Encounter (Signed)
Order placed for covid antibody testing.  

## 2020-07-05 ENCOUNTER — Other Ambulatory Visit: Payer: Self-pay

## 2020-07-05 ENCOUNTER — Other Ambulatory Visit (INDEPENDENT_AMBULATORY_CARE_PROVIDER_SITE_OTHER): Payer: Medicare Other

## 2020-07-05 DIAGNOSIS — Z0184 Encounter for antibody response examination: Secondary | ICD-10-CM

## 2020-07-05 NOTE — Addendum Note (Signed)
Addended by: Leeanne Rio on: 07/05/2020 08:20 AM   Modules accepted: Orders

## 2020-07-05 NOTE — Telephone Encounter (Signed)
Changed order to future

## 2020-07-09 ENCOUNTER — Telehealth: Payer: Self-pay | Admitting: Internal Medicine

## 2020-07-09 LAB — SARS-COV-2 SEMI-QUANTITATIVE TOTAL ANTIBODY, SPIKE: SARS COV2 AB, Total Spike Semi QN: >2500 U/mL — ABNORMAL HIGH (ref <0.8)

## 2020-07-09 NOTE — Telephone Encounter (Signed)
Patient saw her lab results for covid antibodies and is wanting to know if she needs the booster.

## 2020-07-10 NOTE — Telephone Encounter (Signed)
See me before calling pt.  I tried to call pt.  Left message on her cell phone and her home phone.  Please explain to her that there are two different scenarios - regarding the third shot/booster.  In immunocompromised pts, it is being recommended for them to get a third shot.  Regarding a booster, waiting for more specifics regarding booster, timing of booster, etc.

## 2020-07-12 NOTE — Telephone Encounter (Signed)
Pt is asking for a call back regarding antibodies results.

## 2020-07-13 NOTE — Telephone Encounter (Signed)
Pt called back returning your call °

## 2020-07-14 NOTE — Telephone Encounter (Signed)
At this point, the third shot is recommended for immunocompromised patients:  immunocompromising conditions that warrant a 3-dose series for the COVID-19 mRNA vaccines in the [place][1] Active treatment for solid tumor and hematologic malignancies  Receipt of solid-organ transplant and taking immunosuppressive therapy  Receipt of CAR-T-cell or hematopoietic stem cell transplant (within 2 years of transplantation or taking immunosuppressive therapy)  Moderate or severe primary immunodeficiency (eg, DiGeorge, Wiskott-Aldrich syndromes)  Advanced or untreated HIV infection  Active treatment with: High-dose corticosteroids (ie, ?20 mg prednisone or equivalent per day) Alkylating agents Antimetabolites Transplant-related immunosuppressive drugs Cancer chemotherapeutic agents classified as severely immunosuppressive TNF blockers Other biologic agents that are immunosuppressive or immunomodulatory   We should be hearing more soon regarding recommendations for a "booster".  This is confusing and I hope this helps.  Let me know if any questions.

## 2020-07-14 NOTE — Telephone Encounter (Signed)
Noted  

## 2020-07-14 NOTE — Telephone Encounter (Signed)
Patient is aware that she does have antibodies and should hold on getting her booster shot.

## 2020-08-09 ENCOUNTER — Other Ambulatory Visit: Payer: Self-pay

## 2020-08-09 MED ORDER — CARVEDILOL 6.25 MG PO TABS
6.2500 mg | ORAL_TABLET | Freq: Two times a day (BID) | ORAL | 0 refills | Status: DC
Start: 2020-08-09 — End: 2020-11-05

## 2020-09-07 ENCOUNTER — Encounter: Payer: Self-pay | Admitting: Internal Medicine

## 2020-09-07 ENCOUNTER — Other Ambulatory Visit: Payer: Self-pay

## 2020-09-07 ENCOUNTER — Ambulatory Visit (INDEPENDENT_AMBULATORY_CARE_PROVIDER_SITE_OTHER): Payer: Medicare Other | Admitting: Internal Medicine

## 2020-09-07 VITALS — BP 136/78 | HR 66 | Temp 98.1°F | Resp 16 | Ht 62.0 in | Wt 134.0 lb

## 2020-09-07 DIAGNOSIS — M8580 Other specified disorders of bone density and structure, unspecified site: Secondary | ICD-10-CM

## 2020-09-07 DIAGNOSIS — E7800 Pure hypercholesterolemia, unspecified: Secondary | ICD-10-CM | POA: Diagnosis not present

## 2020-09-07 DIAGNOSIS — I10 Essential (primary) hypertension: Secondary | ICD-10-CM

## 2020-09-07 DIAGNOSIS — K760 Fatty (change of) liver, not elsewhere classified: Secondary | ICD-10-CM | POA: Diagnosis not present

## 2020-09-07 DIAGNOSIS — E2839 Other primary ovarian failure: Secondary | ICD-10-CM

## 2020-09-07 DIAGNOSIS — E039 Hypothyroidism, unspecified: Secondary | ICD-10-CM

## 2020-09-07 DIAGNOSIS — Z0000 Encounter for general adult medical examination without abnormal findings: Secondary | ICD-10-CM | POA: Diagnosis not present

## 2020-09-07 DIAGNOSIS — R42 Dizziness and giddiness: Secondary | ICD-10-CM

## 2020-09-07 MED ORDER — OMEPRAZOLE 20 MG PO CPDR
20.0000 mg | DELAYED_RELEASE_CAPSULE | Freq: Every day | ORAL | 1 refills | Status: DC
Start: 2020-09-07 — End: 2021-03-07

## 2020-09-07 NOTE — Progress Notes (Signed)
Patient ID: Crystal Lynch, female   DOB: Jul 02, 1941, 79 y.o.   MRN: [id number]   Subjective:    Patient ID: Crystal Lynch, female    DOB: [birth year], 79 y.o.   MRN: [id number]  HPI This visit occurred during the SARS-CoV-2 public health emergency.  Safety protocols were in place, including screening questions prior to the visit, additional usage of staff PPE, and extensive cleaning of exam room while observing appropriate contact time as indicated for disinfecting solutions.  Patient here for her physical exam.  Is having problems with dizziness.  States symptoms have been going on for at least 6 months.  Intermittent.  Occurs if she is sitting and stands too fast.  Also has occurred after riding in car and gets out or after sitting in church.  No room spinning.  No dizziness when sitting or lying.  No chest pain or sob reported.  No abdominal pain.  Eating.  Bowels moving.  Has appt with Dr Tami Ribas next week - to evaluate dizziness.  She is concerned is related to her sinus.  No increased nasal congestion.  Does have some increased sinus pressure at times.  No headache.  No syncope or near syncope.    Past Medical History:  Diagnosis Date  . Arthritis   . Chicken pox   . Endometriosis   . Esophagitis    s/p esophageal dilatation  . GERD (gastroesophageal reflux disease)   . History of ovarian cyst   . Hypercholesterolemia   . Hypertension   . Hypothyroidism    Past Surgical History:  Procedure Laterality Date  . ABDOMINAL SURGERY     found to have an ovarian cyst and endometriosis  . APPENDECTOMY     Family History  Problem Relation Age of Onset  . Lung disease Father   . Heart attack Father   . Heart disease Mother        myocardial infarction  . Breast cancer Mother 10  . Heart attack Mother   . Hypertension Mother   . Hyperlipidemia Mother   . Breast cancer Sister 68  . Hypertension Sister   . Rheum arthritis Sister   . Breast cancer Maternal Aunt 56  . Hypertension  Brother   . Colon cancer Neg Hx    Social History   Socioeconomic History  . Marital status: Married    Spouse name: Not on file  . Number of children: Not on file  . Years of education: Not on file  . Highest education level: Not on file  Occupational History  . Not on file  Tobacco Use  . Smoking status: Never Smoker  . Smokeless tobacco: Never Used  Vaping Use  . Vaping Use: Never used  Substance and Sexual Activity  . Alcohol use: No    Alcohol/week: 0.0 standard drinks  . Drug use: No  . Sexual activity: Never  Other Topics Concern  . Not on file  Social History Narrative  . Not on file   Social Determinants of Health   Financial Resource Strain:   . Difficulty of Paying Living Expenses: Not on file  Food Insecurity:   . Worried About Charity fundraiser in the Last Year: Not on file  . Ran Out of Food in the Last Year: Not on file  Transportation Needs:   . Lack of Transportation (Medical): Not on file  . Lack of Transportation (Non-Medical): Not on file  Physical Activity:   . Days of Exercise per Week: Not  on file  . Minutes of Exercise per Session: Not on file  Stress:   . Feeling of Stress : Not on file  Social Connections:   . Frequency of Communication with Friends and Family: Not on file  . Frequency of Social Gatherings with Friends and Family: Not on file  . Attends Religious Services: Not on file  . Active Member of Clubs or Organizations: Not on file  . Attends Archivist Meetings: Not on file  . Marital Status: Not on file    Outpatient Encounter Medications as of 09/07/2020  Medication Sig  . Biotin 5000 MCG CAPS Take by mouth.  . carvedilol (COREG) 6.25 MG tablet Take 1 tablet (6.25 mg total) by mouth 2 (two) times daily.  . Cholecalciferol (VITAMIN D3 PO) Take by mouth daily.  . dorzolamide-timolol (COSOPT) 22.3-6.8 MG/ML ophthalmic solution Place 1 drop into both eyes 2 (two) times daily.  Marland Kitchen levothyroxine (SYNTHROID) 88 MCG  tablet Take 1 tablet (88 mcg total) by mouth daily.  Marland Kitchen lisinopril (ZESTRIL) 40 MG tablet Take 1 tablet (40 mg total) by mouth daily.  . Multiple Vitamins-Minerals (ZINC PO) Take by mouth daily.  Marland Kitchen omeprazole (PRILOSEC) 20 MG capsule Take 1 capsule (20 mg total) by mouth daily.  . simvastatin (ZOCOR) 10 MG tablet Take 1 tablet (10 mg total) by mouth at bedtime.  Marland Kitchen spironolactone (ALDACTONE) 25 MG tablet Take 1 tablet (25 mg total) by mouth daily.  . [DISCONTINUED] omeprazole (PRILOSEC) 20 MG capsule TAKE 1 CAPSULE DAILY   No facility-administered encounter medications on file as of 09/07/2020.    Review of Systems  Constitutional: Negative for appetite change and unexpected weight change.  HENT: Negative for congestion and sore throat.        Some occasional sinus pressure.   Eyes: Negative for pain and visual disturbance.  Respiratory: Negative for cough, chest tightness and shortness of breath.   Cardiovascular: Negative for chest pain, palpitations and leg swelling.  Gastrointestinal: Negative for abdominal pain, diarrhea, nausea and vomiting.  Genitourinary: Negative for difficulty urinating and dysuria.  Musculoskeletal: Negative for joint swelling and myalgias.  Skin: Negative for color change and rash.  Neurological: Positive for dizziness. Negative for headaches.  Hematological: Negative for adenopathy. Does not bruise/bleed easily.  Psychiatric/Behavioral: Negative for agitation and dysphoric mood.       Objective:    Physical Exam Vitals reviewed.  Constitutional:      General: She is not in acute distress.    Appearance: Normal appearance. She is well-developed.  HENT:     Head: Normocephalic and atraumatic.     Right Ear: External ear normal.     Left Ear: External ear normal.  Eyes:     General: No scleral icterus.       Right eye: No discharge.        Left eye: No discharge.     Conjunctiva/sclera: Conjunctivae normal.  Neck:     Thyroid: No thyromegaly.    Cardiovascular:     Rate and Rhythm: Normal rate and regular rhythm.  Pulmonary:     Effort: No tachypnea, accessory muscle usage or respiratory distress.     Breath sounds: Normal breath sounds. No decreased breath sounds or wheezing.  Chest:     Breasts:        Right: No inverted nipple, mass, nipple discharge or tenderness (no axillary adenopathy).        Left: No inverted nipple, mass, nipple discharge or tenderness (no axilarry adenopathy).  Abdominal:     General: Bowel sounds are normal.     Palpations: Abdomen is soft.     Tenderness: There is no abdominal tenderness.  Musculoskeletal:        General: No swelling or tenderness.     Cervical back: Neck supple. No tenderness.  Lymphadenopathy:     Cervical: No cervical adenopathy.  Skin:    Findings: No erythema or rash.  Neurological:     Mental Status: She is alert and oriented to person, place, and time.  Psychiatric:        Mood and Affect: Mood normal.        Behavior: Behavior normal.     BP 136/78   Pulse 66   Temp 98.1 F (36.7 C) (Oral)   Resp 16   Ht 5\' 2"  (1.575 m)   Wt 134 lb (60.8 kg)   LMP 10/21/1984   SpO2 99%   BMI 24.51 kg/m  Wt Readings from Last 3 Encounters:  09/07/20 134 lb (60.8 kg)  05/20/20 131 lb 8 oz (59.6 kg)  03/05/20 133 lb (60.3 kg)     Lab Results  Component Value Date   WBC 4.3 12/18/2019   HGB 13.8 12/18/2019   HCT 41.8 12/18/2019   PLT 227.0 12/18/2019   GLUCOSE 96 06/09/2020   CHOL 163 06/09/2020   TRIG 194.0 (H) 06/09/2020   HDL 42.30 06/09/2020   LDLDIRECT 94.0 01/04/2017   LDLCALC 81 06/09/2020   ALT 15 06/09/2020   AST 16 06/09/2020   NA 136 06/09/2020   K 4.4 06/09/2020   CL 103 06/09/2020   CREATININE 0.76 06/09/2020   BUN 13 06/09/2020   CO2 26 06/09/2020   TSH 0.67 06/09/2020   INR 1.0 08/12/2015    MM 3D SCREEN BREAST BILATERAL  Result Date: 03/12/2020 CLINICAL DATA:  Screening. EXAM: DIGITAL SCREENING BILATERAL MAMMOGRAM WITH TOMO AND CAD  COMPARISON:  Previous exam(s). ACR Breast Density Category b: There are scattered areas of fibroglandular density. FINDINGS: There are no findings suspicious for malignancy. Images were processed with CAD. IMPRESSION: No mammographic evidence of malignancy. A result letter of this screening mammogram will be mailed directly to the patient. RECOMMENDATION: Screening mammogram in one year. (Code:SM-B-01Y) BI-RADS CATEGORY  1: Negative. Electronically Signed   By: Evangeline Dakin M.D.   On: 03/12/2020 13:58       Assessment & Plan:   Problem List Items Addressed This Visit    Osteopenia    Due f/u bone density. Ordered.        Hypothyroidism    On thyroid replacement.  Follow tsh.       Hypertension    Blood pressure as outlined.  On aldactone, coreg and zestril.  Follow pressures.  Follow metabolic panel.       Hypercholesterolemia    On simvastatin.  Low cholesterol diet and exercise.  Follow lipid panel and liver function tests.        Health care maintenance    Physical today 09/07/20. Mammogram 03/12/20 - Bir[place] 1.  PAP 03/22/16 - negative with negative HPV.  Colonoscopy 08/2012.        Fatty liver    Found on ultrasound.  Diet and exercise.  Follow liver function tests.        Dizziness    Persistent intermittent issue as outlined.  Unclear etiology.  Not orthostatic on exam.  Blood pressure has done well on current regimen.  No chest pain, increased heart rate or palpitations.  No syncope or near syncopal episodes.  Trial of nasacort.  Occurs after sitting for a while and then standing.  Has appt with ENT next week.  Plans to discuss.  Update me regarding appt.  If no etiology found, will plan for neurological/vascular w/up.  Pt in agreement.         Other Visit Diagnoses    Estrogen deficiency    -  Primary   Relevant Orders   DG Bone Density       Einar Pheasant, MD

## 2020-09-07 NOTE — Assessment & Plan Note (Addendum)
Physical today 09/07/20. Mammogram 03/12/20 - Bir[place] 1.  PAP 03/22/16 - negative with negative HPV.  Colonoscopy 08/2012.

## 2020-09-07 NOTE — Patient Instructions (Signed)
nasacort nasal spray - 2 sprays each nostril one time per day.  Do this in the evening.

## 2020-09-12 ENCOUNTER — Encounter: Payer: Self-pay | Admitting: Internal Medicine

## 2020-09-12 ENCOUNTER — Telehealth: Payer: Self-pay | Admitting: Internal Medicine

## 2020-09-12 DIAGNOSIS — R42 Dizziness and giddiness: Secondary | ICD-10-CM | POA: Insufficient documentation

## 2020-09-12 DIAGNOSIS — R319 Hematuria, unspecified: Secondary | ICD-10-CM

## 2020-09-12 NOTE — Assessment & Plan Note (Signed)
On thyroid replacement.  Follow tsh.  

## 2020-09-12 NOTE — Assessment & Plan Note (Signed)
On simvastatin.  Low cholesterol diet and exercise.  Follow lipid panel and liver function tests.   

## 2020-09-12 NOTE — Assessment & Plan Note (Signed)
Found on ultrasound.  Diet and exercise.  Follow liver function tests.

## 2020-09-12 NOTE — Assessment & Plan Note (Signed)
Persistent intermittent issue as outlined.  Unclear etiology.  Not orthostatic on exam.  Blood pressure has done well on current regimen.  No chest pain, increased heart rate or palpitations.  No syncope or near syncopal episodes.  Trial of nasacort.  Occurs after sitting for a while and then standing.  Has appt with ENT next week.  Plans to discuss.  Update me regarding appt.  If no etiology found, will plan for neurological/vascular w/up.  Pt in agreement.

## 2020-09-12 NOTE — Telephone Encounter (Signed)
See if she would be agreeable to come by and her convenience and drop off a urine sample.  Would like to confirm urine clear - no blood. Order in.

## 2020-09-12 NOTE — Assessment & Plan Note (Signed)
Due f/u bone density. Ordered.

## 2020-09-12 NOTE — Assessment & Plan Note (Signed)
Blood pressure as outlined.  On aldactone, coreg and zestril.  Follow pressures.  Follow metabolic panel.

## 2020-09-13 ENCOUNTER — Other Ambulatory Visit: Payer: Self-pay

## 2020-09-13 ENCOUNTER — Other Ambulatory Visit (INDEPENDENT_AMBULATORY_CARE_PROVIDER_SITE_OTHER): Payer: Medicare Other

## 2020-09-13 DIAGNOSIS — R319 Hematuria, unspecified: Secondary | ICD-10-CM | POA: Diagnosis not present

## 2020-09-13 NOTE — Telephone Encounter (Signed)
Pt scheduled  

## 2020-09-14 LAB — URINALYSIS, ROUTINE W REFLEX MICROSCOPIC
Bilirubin Urine: NEGATIVE
Ketones, ur: NEGATIVE
Nitrite: NEGATIVE
Specific Gravity, Urine: 1.01 (ref 1.000–1.030)
Total Protein, Urine: NEGATIVE
Urine Glucose: NEGATIVE
Urobilinogen, UA: 0.2 (ref 0.0–1.0)
pH: 6 (ref 5.0–8.0)

## 2020-09-26 ENCOUNTER — Other Ambulatory Visit: Payer: Self-pay | Admitting: Student

## 2020-09-26 ENCOUNTER — Ambulatory Visit
Admission: RE | Admit: 2020-09-26 | Discharge: 2020-09-26 | Disposition: A | Discharge status: Home / Self Care | Payer: Medicare Other | Source: Ambulatory Visit | Attending: Student | Admitting: Student

## 2020-09-26 ENCOUNTER — Other Ambulatory Visit: Payer: Self-pay

## 2020-09-26 DIAGNOSIS — S12001A Unspecified nondisplaced fracture of first cervical vertebra, initial encounter for closed fracture: Secondary | ICD-10-CM | POA: Insufficient documentation

## 2020-09-27 ENCOUNTER — Encounter: Payer: Self-pay | Admitting: Internal Medicine

## 2020-09-30 ENCOUNTER — Other Ambulatory Visit: Payer: Self-pay | Admitting: Internal Medicine

## 2020-11-05 ENCOUNTER — Other Ambulatory Visit: Payer: Self-pay | Admitting: Cardiovascular Disease

## 2020-11-08 ENCOUNTER — Other Ambulatory Visit: Payer: Self-pay | Admitting: Internal Medicine

## 2020-11-08 ENCOUNTER — Other Ambulatory Visit: Payer: Self-pay | Admitting: Cardiovascular Disease

## 2020-11-30 ENCOUNTER — Ambulatory Visit (INDEPENDENT_AMBULATORY_CARE_PROVIDER_SITE_OTHER): Payer: Medicare Other | Admitting: Cardiovascular Disease

## 2020-11-30 ENCOUNTER — Other Ambulatory Visit: Payer: Self-pay

## 2020-11-30 ENCOUNTER — Encounter: Payer: Self-pay | Admitting: Cardiovascular Disease

## 2020-11-30 VITALS — BP 142/76 | HR 60 | Ht 62.0 in | Wt 134.0 lb

## 2020-11-30 DIAGNOSIS — E785 Hyperlipidemia, unspecified: Secondary | ICD-10-CM | POA: Diagnosis not present

## 2020-11-30 DIAGNOSIS — I10 Essential (primary) hypertension: Secondary | ICD-10-CM | POA: Diagnosis not present

## 2020-11-30 NOTE — Patient Instructions (Signed)

## 2020-11-30 NOTE — Progress Notes (Signed)
Cardiology Office Note   Date:  11/30/2020   ID:  Sache, Sane 12/19/1940, MRN [id number]  PCP:  Einar Pheasant, MD  Cardiologist:   Kathlyn Sacramento, MD   Chief Complaint  Patient presents with   Follow-up    6 months    Pt states she has been doing well      History of Present Illness: Crystal Lynch is a 80 y.o. female who presents for  a follow-up visit regarding refractory hypertension. She has no previous cardiac history. Other medical problems include hyperlipidemia and GERD.  Renal artery angiography in October 2016 showed no significant renal artery stenosis. She has been doing well with no recent chest pain, shortness of breath or palpitations.  She takes her medications regularly and she is very compliant.    Past Medical History:  Diagnosis Date   Arthritis    Chicken pox    Endometriosis    Esophagitis    s/p esophageal dilatation   GERD (gastroesophageal reflux disease)    History of ovarian cyst    Hypercholesterolemia    Hypertension    Hypothyroidism     Past Surgical History:  Procedure Laterality Date   ABDOMINAL SURGERY     found to have an ovarian cyst and endometriosis   APPENDECTOMY       Current Outpatient Medications  Medication Sig Dispense Refill   Biotin 5000 MCG CAPS Take by mouth.     carvedilol (COREG) 6.25 MG tablet TAKE 1 TABLET TWICE A DAY 180 tablet 0   Cholecalciferol (VITAMIN D3 PO) Take by mouth daily.     dorzolamide-timolol (COSOPT) 22.3-6.8 MG/ML ophthalmic solution Place 1 drop into both eyes 2 (two) times daily.     levothyroxine (SYNTHROID) 88 MCG tablet Take 1 tablet (88 mcg total) by mouth daily. 90 tablet 4   lisinopril (ZESTRIL) 40 MG tablet Take 1 tablet (40 mg total) by mouth daily. 90 tablet 3   Multiple Vitamins-Minerals (ZINC PO) Take by mouth daily.     omeprazole (PRILOSEC) 20 MG capsule Take 1 capsule (20 mg total) by mouth daily. 90 capsule 1   simvastatin (ZOCOR) 10 MG  tablet TAKE 1 TABLET AT BEDTIME 90 tablet 3   spironolactone (ALDACTONE) 25 MG tablet TAKE 1 TABLET DAILY 90 tablet 0   No current facility-administered medications for this visit.    Allergies:   Meloxicam    Social History:  The patient  reports that she has never smoked. She has never used smokeless tobacco. She reports that she does not drink alcohol and does not use drugs.   Family History:  The patient's family history includes Breast cancer (age of onset: 62) in her maternal aunt; Breast cancer (age of onset: 18) in her sister; Breast cancer (age of onset: 6) in her mother; Heart attack in her father and mother; Heart disease in her mother; Hyperlipidemia in her mother; Hypertension in her brother, mother, and sister; Lung disease in her father; Rheum arthritis in her sister.    ROS:  Please see the history of present illness.   Otherwise, review of systems are positive for none.   All other systems are reviewed and negative.    PHYSICAL EXAM: VS:  BP (!) 142/76    Pulse 60    Ht 5\' 2"  (1.575 m)    Wt 134 lb (60.8 kg)    LMP 10/21/1984    BMI 24.51 kg/m  , BMI Body mass index is 24.51 kg/m.  GEN: Well nourished, well developed, in no acute distress  HEENT: normal  Neck: no JVD, carotid bruits, or masses Cardiac: RRR; no murmurs, rubs, or gallops,no edema  Respiratory:  clear to auscultation bilaterally, normal work of breathing GI: soft, nontender, nondistended, + BS MS: no deformity or atrophy  Skin: warm and dry, no rash Neuro:  Strength and sensation are intact Psych: euthymic mood, full affect   EKG:  EKG is ordered today. The ekg ordered today demonstrates normal sinus rhythm with no significant ST or T wave changes.   Recent Labs: 12/18/2019: Hemoglobin 13.8; Platelets 227.0 06/09/2020: ALT 15; BUN 13; Creatinine, Ser 0.76; Potassium 4.4; Sodium 136; TSH 0.67    Lipid Panel    Component Value Date/Time   CHOL 163 06/09/2020 0802   TRIG 194.0 (H) 06/09/2020  0802   HDL 42.30 06/09/2020 0802   CHOLHDL 4 06/09/2020 0802   VLDL 38.8 06/09/2020 0802   LDLCALC 81 06/09/2020 0802   LDLDIRECT 94.0 01/04/2017 0823      Wt Readings from Last 3 Encounters:  11/30/20 134 lb (60.8 kg)  09/07/20 134 lb (60.8 kg)  05/20/20 131 lb 8 oz (59.6 kg)       No flowsheet data found.    ASSESSMENT AND PLAN:   1. Essential hypertension: Blood pressure is reasonably controlled on current medications with no side effects.  I reviewed most recent labs done and August which showed normal renal function and electrolytes.  2. Hyperlipidemia: Continue simvastatin.  I reviewed most recent lipid profile in August which showed an LDL of 81.   Disposition:   FU with me in 12 months  Signed,  Kathlyn Sacramento, MD  11/30/2020 1:54 PM    [facility]

## 2020-12-07 ENCOUNTER — Telehealth: Payer: Self-pay | Admitting: *Deleted

## 2020-12-07 DIAGNOSIS — I10 Essential (primary) hypertension: Secondary | ICD-10-CM

## 2020-12-07 DIAGNOSIS — E7800 Pure hypercholesterolemia, unspecified: Secondary | ICD-10-CM

## 2020-12-07 NOTE — Telephone Encounter (Signed)
Please place future orders for lab appt.  

## 2020-12-07 NOTE — Telephone Encounter (Signed)
Orders placed for f/u labs.  

## 2020-12-08 ENCOUNTER — Other Ambulatory Visit: Payer: Self-pay

## 2020-12-08 ENCOUNTER — Other Ambulatory Visit (INDEPENDENT_AMBULATORY_CARE_PROVIDER_SITE_OTHER): Payer: Medicare Other

## 2020-12-08 DIAGNOSIS — I10 Essential (primary) hypertension: Secondary | ICD-10-CM | POA: Diagnosis not present

## 2020-12-08 DIAGNOSIS — E7800 Pure hypercholesterolemia, unspecified: Secondary | ICD-10-CM

## 2020-12-08 LAB — LIPID PANEL
Cholesterol: 153 mg/dL (ref 0–200)
HDL: 43.5 mg/dL (ref >39.00)
LDL Cholesterol: 74 mg/dL (ref 0–99)
NonHDL: 109.71
Total CHOL/HDL Ratio: 4
Triglycerides: 181 mg/dL — ABNORMAL HIGH (ref 0.0–149.0)
VLDL: 36.2 mg/dL (ref 0.0–40.0)

## 2020-12-08 LAB — BASIC METABOLIC PANEL
BUN: 17 mg/dL (ref 6–23)
CO2: 27 mEq/L (ref 19–32)
Calcium: 10.3 mg/dL (ref 8.4–10.5)
Chloride: 105 mEq/L (ref 96–112)
Creatinine, Ser: 0.8 mg/dL (ref 0.40–1.20)
GFR: 69.31 mL/min (ref >60.00)
Glucose, Bld: 102 mg/dL — ABNORMAL HIGH (ref 70–99)
Potassium: 4.1 mEq/L (ref 3.5–5.1)
Sodium: 139 mEq/L (ref 135–145)

## 2020-12-08 LAB — CBC WITH DIFFERENTIAL/PLATELET
Basophils Absolute: 0 10*3/uL (ref 0.0–0.1)
Basophils Relative: 0.5 % (ref 0.0–3.0)
Eosinophils Absolute: 0.1 10*3/uL (ref 0.0–0.7)
Eosinophils Relative: 2.4 % (ref 0.0–5.0)
HCT: 42.4 % (ref 36.0–46.0)
Hemoglobin: 14.5 g/dL (ref 12.0–15.0)
Lymphocytes Relative: 39.4 % (ref 12.0–46.0)
Lymphs Abs: 2.1 10*3/uL (ref 0.7–4.0)
MCHC: 34.1 g/dL (ref 30.0–36.0)
MCV: 92.4 fl (ref 78.0–100.0)
Monocytes Absolute: 0.5 10*3/uL (ref 0.1–1.0)
Monocytes Relative: 9.3 % (ref 3.0–12.0)
Neutro Abs: 2.5 10*3/uL (ref 1.4–7.7)
Neutrophils Relative %: 48.4 % (ref 43.0–77.0)
Platelets: 232 10*3/uL (ref 150.0–400.0)
RBC: 4.59 Mil/uL (ref 3.87–5.11)
RDW: 12.8 % (ref 11.5–15.5)
WBC: 5.2 10*3/uL (ref 4.0–10.5)

## 2020-12-08 LAB — HEPATIC FUNCTION PANEL
ALT: 16 U/L (ref 0–35)
AST: 15 U/L (ref 0–37)
Albumin: 4.2 g/dL (ref 3.5–5.2)
Alkaline Phosphatase: 69 U/L (ref 39–117)
Bilirubin, Direct: 0.2 mg/dL (ref 0.0–0.3)
Total Bilirubin: 1.2 mg/dL (ref 0.2–1.2)
Total Protein: 6.7 g/dL (ref 6.0–8.3)

## 2020-12-10 ENCOUNTER — Encounter: Payer: Self-pay | Admitting: Internal Medicine

## 2020-12-10 ENCOUNTER — Other Ambulatory Visit: Payer: Self-pay

## 2020-12-10 ENCOUNTER — Ambulatory Visit (INDEPENDENT_AMBULATORY_CARE_PROVIDER_SITE_OTHER): Payer: Medicare Other | Admitting: Internal Medicine

## 2020-12-10 DIAGNOSIS — I10 Essential (primary) hypertension: Secondary | ICD-10-CM

## 2020-12-10 DIAGNOSIS — E039 Hypothyroidism, unspecified: Secondary | ICD-10-CM | POA: Diagnosis not present

## 2020-12-10 DIAGNOSIS — R319 Hematuria, unspecified: Secondary | ICD-10-CM

## 2020-12-10 DIAGNOSIS — E7800 Pure hypercholesterolemia, unspecified: Secondary | ICD-10-CM | POA: Diagnosis not present

## 2020-12-10 DIAGNOSIS — R42 Dizziness and giddiness: Secondary | ICD-10-CM

## 2020-12-10 DIAGNOSIS — K760 Fatty (change of) liver, not elsewhere classified: Secondary | ICD-10-CM

## 2020-12-10 NOTE — Progress Notes (Signed)
Patient ID: Crystal Lynch, female   DOB: [birth year], 80 y.o.   MRN: [id number]   Subjective:    Patient ID: Crystal Lynch, female    DOB: Oct 01, 1941, 80 y.o.   MRN: [id number]  HPI This visit occurred during the SARS-CoV-2 public health emergency.  Safety protocols were in place, including screening questions prior to the visit, additional usage of staff PPE, and extensive cleaning of exam room while observing appropriate contact time as indicated for disinfecting solutions.  Patient here for a scheduled follow up.  Here to follow up regarding her cholesterol and blood pressure.  She is doing well.  Increased stress with her husbands sister's health issues.  She appears to be handling things well.  Some occasional hip discomfort.  Has bursitis.  Sees [facility] prn.  S/p injection prn.  Helps.  Does not feel needs any further intervention at this time.  No chest pain or sob.  No acid reflux on prilosec.  No abdominal pain.  Bowels moving.    Past Medical History:  Diagnosis Date  . Arthritis   . Chicken pox   . Endometriosis   . Esophagitis    s/p esophageal dilatation  . GERD (gastroesophageal reflux disease)   . History of ovarian cyst   . Hypercholesterolemia   . Hypertension   . Hypothyroidism    Past Surgical History:  Procedure Laterality Date  . ABDOMINAL SURGERY     found to have an ovarian cyst and endometriosis  . APPENDECTOMY     Family History  Problem Relation Age of Onset  . Lung disease Father   . Heart attack Father   . Heart disease Mother        myocardial infarction  . Breast cancer Mother 62  . Heart attack Mother   . Hypertension Mother   . Hyperlipidemia Mother   . Breast cancer Sister 26  . Hypertension Sister   . Rheum arthritis Sister   . Breast cancer Maternal Aunt 83  . Hypertension Brother   . Colon cancer Neg Hx    Social History   Socioeconomic History  . Marital status: Married    Spouse name: Not on file  . Number of children:  Not on file  . Years of education: Not on file  . Highest education level: Not on file  Occupational History  . Not on file  Tobacco Use  . Smoking status: Never Smoker  . Smokeless tobacco: Never Used  Vaping Use  . Vaping Use: Never used  Substance and Sexual Activity  . Alcohol use: No    Alcohol/week: 0.0 standard drinks  . Drug use: No  . Sexual activity: Never  Other Topics Concern  . Not on file  Social History Narrative  . Not on file   Social Determinants of Health   Financial Resource Strain: Not on file  Food Insecurity: Not on file  Transportation Needs: Not on file  Physical Activity: Not on file  Stress: Not on file  Social Connections: Not on file    Outpatient Encounter Medications as of 12/10/2020  Medication Sig  . Biotin 5000 MCG CAPS Take by mouth.  . carvedilol (COREG) 6.25 MG tablet TAKE 1 TABLET TWICE A DAY  . Cholecalciferol (VITAMIN D3 PO) Take by mouth daily.  . dorzolamide-timolol (COSOPT) 22.3-6.8 MG/ML ophthalmic solution Place 1 drop into both eyes 2 (two) times daily.  Marland Kitchen levothyroxine (SYNTHROID) 88 MCG tablet Take 1 tablet (88 mcg total) by mouth daily.  Marland Kitchen  lisinopril (ZESTRIL) 40 MG tablet Take 1 tablet (40 mg total) by mouth daily.  . Multiple Vitamins-Minerals (ZINC PO) Take by mouth daily.  Marland Kitchen omeprazole (PRILOSEC) 20 MG capsule Take 1 capsule (20 mg total) by mouth daily.  . simvastatin (ZOCOR) 10 MG tablet TAKE 1 TABLET AT BEDTIME  . spironolactone (ALDACTONE) 25 MG tablet TAKE 1 TABLET DAILY   No facility-administered encounter medications on file as of 12/10/2020.    Review of Systems  Constitutional: Negative for appetite change and unexpected weight change.  HENT: Negative for congestion and sinus pressure.   Respiratory: Negative for cough, chest tightness and shortness of breath.   Cardiovascular: Negative for chest pain, palpitations and leg swelling.  Gastrointestinal: Negative for abdominal pain, diarrhea, nausea and  vomiting.  Genitourinary: Negative for difficulty urinating and dysuria.  Musculoskeletal: Negative for joint swelling and myalgias.       Bursitis as outlined.  Sees [facility].   Skin: Negative for color change and rash.  Neurological: Negative for dizziness, light-headedness and headaches.  Psychiatric/Behavioral: Negative for agitation and dysphoric mood.       Objective:    Physical Exam Vitals reviewed.  Constitutional:      General: She is not in acute distress.    Appearance: Normal appearance.  HENT:     Head: Normocephalic and atraumatic.     Right Ear: External ear normal.     Left Ear: External ear normal.     Mouth/Throat:     Mouth: Oropharynx is clear and moist.  Eyes:     General: No scleral icterus.       Right eye: No discharge.        Left eye: No discharge.     Conjunctiva/sclera: Conjunctivae normal.  Neck:     Thyroid: No thyromegaly.  Cardiovascular:     Rate and Rhythm: Normal rate and regular rhythm.  Pulmonary:     Effort: No respiratory distress.     Breath sounds: Normal breath sounds. No wheezing.  Abdominal:     General: Bowel sounds are normal.     Palpations: Abdomen is soft.     Tenderness: There is no abdominal tenderness.  Musculoskeletal:        General: No swelling, tenderness or edema.     Cervical back: Neck supple. No tenderness.  Lymphadenopathy:     Cervical: No cervical adenopathy.  Skin:    Findings: No erythema or rash.  Neurological:     Mental Status: She is alert.  Psychiatric:        Mood and Affect: Mood normal.        Behavior: Behavior normal.     BP 128/70   Pulse 74   Temp 97.7 F (36.5 C) (Oral)   Resp 16   Ht 5\' 2"  (1.575 m)   Wt 132 lb (59.9 kg)   LMP 10/21/1984   SpO2 99%   BMI 24.14 kg/m  Wt Readings from Last 3 Encounters:  12/10/20 132 lb (59.9 kg)  11/30/20 134 lb (60.8 kg)  09/07/20 134 lb (60.8 kg)     Lab Results  Component Value Date   WBC 5.2 12/08/2020   HGB 14.5 12/08/2020    HCT 42.4 12/08/2020   PLT 232.0 12/08/2020   GLUCOSE 102 (H) 12/08/2020   CHOL 153 12/08/2020   TRIG 181.0 (H) 12/08/2020   HDL 43.50 12/08/2020   LDLDIRECT 94.0 01/04/2017   LDLCALC 74 12/08/2020   ALT 16 12/08/2020   AST 15  12/08/2020   NA 139 12/08/2020   K 4.1 12/08/2020   CL 105 12/08/2020   CREATININE 0.80 12/08/2020   BUN 17 12/08/2020   CO2 27 12/08/2020   TSH 0.67 06/09/2020   INR 1.0 08/12/2015    CT CERVICAL SPINE WO CONTRAST  Addendum Date: 09/26/2020   ADDENDUM REPORT: 09/26/2020 17:20 ADDENDUM: I just spoke with the patient's provider by telephone, Crystal Lynch. In office x-rays performed on this patient suggested the presence of a posterior C1 arch fracture. CT imaging today shows incomplete ossification of the posterior arch of C1, but no fracture. Electronically Signed   By: Misty Stanley M.D.   On: 09/26/2020 17:20   Result Date: 09/26/2020 CLINICAL DATA:  Patient fell in bathtub last night. EXAM: CT CERVICAL SPINE WITHOUT CONTRAST TECHNIQUE: Multidetector CT imaging of the cervical spine was performed without intravenous contrast. Multiplanar CT image reconstructions were also generated. COMPARISON:  None. FINDINGS: Alignment: Straightening of normal cervical lordosis. No subluxation. Skull base and vertebrae: No acute fracture. No primary bone lesion or focal pathologic process. Soft tissues and spinal canal: No prevertebral fluid or swelling. No visible canal hematoma. Disc levels: Complete loss of disc height noted at C5-6 with extensive endplate spurring. Similar marked loss of disc height at C6-7. 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 Mild bilateral facet osteoarthritis noted bilaterally with fusion of the left-sided C4-5 facets. Upper chest: Unremarkable. Other: None. IMPRESSION: 1. Degenerative changes in the cervical spine without evidence for an acute fracture. 2. Loss of cervical lordosis. This can  be related to patient positioning, muscle spasm or soft tissue injury. These results will be called to the ordering clinician or representative by the Radiologist Assistant, and communication documented in the PACS or [employer]. Electronically Signed: By: Misty Stanley M.D. On: 09/26/2020 16:54       Assessment & Plan:   Problem List Items Addressed This Visit    Dizziness    Resolved.  Not an issue now.  Follow.       Fatty liver    Found on ultrasound.  Diet and exercise.  Follow liver panel.       Hematuria    Urine 08/2020 - no red blood cells.  Trace hgb.  Follow.        Hypercholesterolemia    On simvastatin.  Low cholesterol diet and exercise.  Follow lipid panel and liver function tests.   Lab Results  Component Value Date   CHOL 153 12/08/2020   HDL 43.50 12/08/2020   LDLCALC 74 12/08/2020   LDLDIRECT 94.0 01/04/2017   TRIG 181.0 (H) 12/08/2020   CHOLHDL 4 12/08/2020        Relevant Orders   Hepatic function panel   Lipid panel   Hypertension    Blood pressure as outlined.  Continue lisinopril, aldactone and coreg.  Follow pressures.  Follow metabolic panel.        Relevant Orders   Basic metabolic panel   Hypothyroidism    On thyroid replacement.  Follow tsh.            Einar Pheasant, MD

## 2020-12-11 ENCOUNTER — Encounter: Payer: Self-pay | Admitting: Internal Medicine

## 2020-12-11 NOTE — Assessment & Plan Note (Signed)
On thyroid replacement.  Follow tsh.  

## 2020-12-11 NOTE — Assessment & Plan Note (Signed)
Resolved.  Not an issue now.  Follow.

## 2020-12-11 NOTE — Assessment & Plan Note (Signed)
On simvastatin.  Low cholesterol diet and exercise.  Follow lipid panel and liver function tests.   Lab Results  Component Value Date   CHOL 153 12/08/2020   HDL 43.50 12/08/2020   LDLCALC 74 12/08/2020   LDLDIRECT 94.0 01/04/2017   TRIG 181.0 (H) 12/08/2020   CHOLHDL 4 12/08/2020

## 2020-12-11 NOTE — Assessment & Plan Note (Signed)
Urine 08/2020 - no red blood cells.  Trace hgb.  Follow.

## 2020-12-11 NOTE — Assessment & Plan Note (Signed)
Found on ultrasound.  Diet and exercise.  Follow liver panel.

## 2020-12-11 NOTE — Assessment & Plan Note (Signed)
Blood pressure as outlined.  Continue lisinopril, aldactone and coreg.  Follow pressures.  Follow metabolic panel.

## 2021-02-10 ENCOUNTER — Other Ambulatory Visit: Payer: Self-pay | Admitting: Internal Medicine

## 2021-02-10 DIAGNOSIS — Z1231 Encounter for screening mammogram for malignant neoplasm of breast: Secondary | ICD-10-CM

## 2021-02-21 ENCOUNTER — Telehealth: Payer: Self-pay | Admitting: Cardiovascular Disease

## 2021-02-21 NOTE — Telephone Encounter (Signed)
Pt c/o BP issue: STAT if pt c/o blurred vision, one-sided weakness or slurred speech  1. What are your last 5 BP readings? 02/17/21 158/70, 4/29 146/66, 4/29 132/57, 5/2 154/67, 5/2 151/65 after taking meds did not drop  2. Are you having any other symptoms (ex. Dizziness, headache, blurred vision, passed out)? Dizzines, unsteady balance   3. What is your BP issue? Elevated

## 2021-02-21 NOTE — Telephone Encounter (Signed)
Spoke with patients spouse and he states she went to pick up their grandson. Provided him with our number and to please have her give [place] a call back.

## 2021-02-21 NOTE — Telephone Encounter (Signed)
Spoke with patient and reviewed her cardiac medications which she has been on for some time. Inquired if these medications have been changed and she states no changes recently. She reports some new eye issues and ear/sinues issues which she has upcoming appointment with Dr. Tami Ribas for evaluation. She states that this dizziness and unsteady balance is what concerns her more that the elevated readings. She wanted to know if these could cause these symptoms. Reviewed that I am not familiar with eye disorders and that it may be worthwhile to check with her eye doctor. Advised that there are some ear issues that can cause these symptoms. Instructed her to continue monitoring her blood pressures and if they remain elevated greater than 130/80 to please call us back. In the meantime advised to also check with her eye doctor and then keep her appointment with Dr. Tami Ribas. She verbalized understanding of our conversation, agreement with plan, and had no further questions at this time.

## 2021-02-22 ENCOUNTER — Encounter: Payer: Self-pay | Admitting: Adult Health

## 2021-02-22 ENCOUNTER — Other Ambulatory Visit: Payer: Self-pay | Admitting: Adult Health

## 2021-02-22 ENCOUNTER — Ambulatory Visit (INDEPENDENT_AMBULATORY_CARE_PROVIDER_SITE_OTHER): Payer: Medicare Other | Admitting: Adult Health

## 2021-02-22 ENCOUNTER — Other Ambulatory Visit: Payer: Self-pay

## 2021-02-22 VITALS — BP 142/80 | HR 66 | Temp 96.5°F | Ht 62.01 in | Wt 130.4 lb

## 2021-02-22 DIAGNOSIS — R42 Dizziness and giddiness: Secondary | ICD-10-CM

## 2021-02-22 DIAGNOSIS — H6501 Acute serous otitis media, right ear: Secondary | ICD-10-CM

## 2021-02-22 DIAGNOSIS — E039 Hypothyroidism, unspecified: Secondary | ICD-10-CM

## 2021-02-22 DIAGNOSIS — H6981 Other specified disorders of Eustachian tube, right ear: Secondary | ICD-10-CM | POA: Diagnosis not present

## 2021-02-22 DIAGNOSIS — E871 Hypo-osmolality and hyponatremia: Secondary | ICD-10-CM

## 2021-02-22 LAB — COMPREHENSIVE METABOLIC PANEL
ALT: 18 U/L (ref 0–35)
AST: 14 U/L (ref 0–37)
Albumin: 4.5 g/dL (ref 3.5–5.2)
Alkaline Phosphatase: 65 U/L (ref 39–117)
BUN: 13 mg/dL (ref 6–23)
CO2: 25 mEq/L (ref 19–32)
Calcium: 10.2 mg/dL (ref 8.4–10.5)
Chloride: 94 mEq/L — ABNORMAL LOW (ref 96–112)
Creatinine, Ser: 0.72 mg/dL (ref 0.40–1.20)
GFR: 78.53 mL/min (ref >60.00)
Glucose, Bld: 88 mg/dL (ref 70–99)
Potassium: 4.3 mEq/L (ref 3.5–5.1)
Sodium: 127 mEq/L — ABNORMAL LOW (ref 135–145)
Total Bilirubin: 1.2 mg/dL (ref 0.2–1.2)
Total Protein: 6.3 g/dL (ref 6.0–8.3)

## 2021-02-22 LAB — CBC WITH DIFFERENTIAL/PLATELET
Basophils Absolute: 0 10*3/uL (ref 0.0–0.1)
Basophils Relative: 0.6 % (ref 0.0–3.0)
Eosinophils Absolute: 0.1 10*3/uL (ref 0.0–0.7)
Eosinophils Relative: 1.1 % (ref 0.0–5.0)
HCT: 41.2 % (ref 36.0–46.0)
Hemoglobin: 14 g/dL (ref 12.0–15.0)
Lymphocytes Relative: 30 % (ref 12.0–46.0)
Lymphs Abs: 2.1 10*3/uL (ref 0.7–4.0)
MCHC: 34.1 g/dL (ref 30.0–36.0)
MCV: 93.1 fl (ref 78.0–100.0)
Monocytes Absolute: 0.7 10*3/uL (ref 0.1–1.0)
Monocytes Relative: 10.3 % (ref 3.0–12.0)
Neutro Abs: 4 10*3/uL (ref 1.4–7.7)
Neutrophils Relative %: 58 % (ref 43.0–77.0)
Platelets: 269 10*3/uL (ref 150.0–400.0)
RBC: 4.42 Mil/uL (ref 3.87–5.11)
RDW: 13.1 % (ref 11.5–15.5)
WBC: 6.9 10*3/uL (ref 4.0–10.5)

## 2021-02-22 LAB — TSH: TSH: 0.34 u[IU]/mL — ABNORMAL LOW (ref 0.35–4.50)

## 2021-02-22 MED ORDER — PREDNISONE 10 MG (21) PO TBPK: ORAL_TABLET | ORAL | 0 refills | Status: DC

## 2021-02-22 MED ORDER — CEFDINIR 300 MG PO CAPS: 300.0000 mg | ORAL_CAPSULE | Freq: Two times a day (BID) | ORAL | 0 refills | Status: DC

## 2021-02-22 MED ORDER — LEVOTHYROXINE SODIUM 75 MCG PO TABS: 75.0000 ug | ORAL_TABLET | Freq: Every day | ORAL | 1 refills | Status: DC

## 2021-02-22 NOTE — Patient Instructions (Signed)
Otitis Media, Adult  Otitis media is a condition in which the middle ear is red and swollen (inflamed) and full of fluid. The middle ear is the part of the ear that contains bones for hearing as well as air that helps send sounds to the brain. The condition usually goes away on its own. What are the causes? This condition is caused by a blockage in the eustachian tube. The eustachian tube connects the middle ear to the back of the nose. It normally allows air into the middle ear. The blockage is caused by fluid or swelling. Problems that can cause blockage include:  A cold or infection that affects the nose, mouth, or throat.  Allergies.  An irritant, such as tobacco smoke.  Adenoids that have become large. The adenoids are soft tissue located in the back of the throat, behind the nose and the roof of the mouth.  Growth or swelling in the upper part of the throat, just behind the nose (nasopharynx).  Damage to the ear caused by change in pressure. This is called barotrauma. What are the signs or symptoms? Symptoms of this condition include:  Ear pain.  Fever.  Problems with hearing.  Being tired.  Fluid leaking from the ear.  Ringing in the ear. How is this treated? This condition can go away on its own within 3-5 days. But if the condition is caused by bacteria or does not go away on its own, or if it keeps coming back, your doctor may:  Give you antibiotic medicines.  Give you medicines for pain. Follow these instructions at home:  Take over-the-counter and prescription medicines only as told by your doctor.  If you were prescribed an antibiotic medicine, take it as told by your doctor. Do not stop taking the antibiotic even if you start to feel better.  Keep all follow-up visits as told by your doctor. This is important. Contact a doctor if:  You have bleeding from your nose.  There is a lump on your neck.  You are not feeling better in 5 days.  You feel worse  instead of better. Get help right away if:  You have pain that is not helped with medicine.  You have swelling, redness, or pain around your ear.  You get a stiff neck.  You cannot move part of your face (paralysis).  You notice that the bone behind your ear hurts when you touch it.  You get a very bad headache. Summary  Otitis media means that the middle ear is red, swollen, and full of fluid.  This condition usually goes away on its own.  If the problem does not go away, treatment may be needed. You may be given medicines to treat the infection or to treat your pain.  If you were prescribed an antibiotic medicine, take it as told by your doctor. Do not stop taking the antibiotic even if you start to feel better.  Keep all follow-up visits as told by your doctor. This is important. This information is not intended to replace advice given to you by your health care provider. Make sure you discuss any questions you have with your health care provider. Document Revised: 09/11/2019 Document Reviewed: 09/11/2019 Elsevier Patient Education  [place]. Eustachian Tube Dysfunction  Eustachian tube dysfunction refers to a condition in which a blockage develops in the narrow passage that connects the middle ear to the back of the nose (eustachian tube). The eustachian tube regulates air pressure in the middle  ear by letting air move between the ear and nose. It also helps to drain fluid from the middle ear space. Eustachian tube dysfunction can affect one or both ears. When the eustachian tube does not function properly, air pressure, fluid, or both can build up in the middle ear. What are the causes? This condition occurs when the eustachian tube becomes blocked or cannot open normally. Common causes of this condition include:  Ear infections.  Colds and other infections that affect the nose, mouth, and throat (upper respiratory tract).  Allergies.  Irritation from cigarette  smoke.  Irritation from stomach acid coming up into the esophagus (gastroesophageal reflux). The esophagus is the tube that carries food from the mouth to the stomach.  Sudden changes in air pressure, such as from descending in an airplane or scuba diving.  Abnormal growths in the nose or throat, such as: ? Growths that line the nose (nasal polyps). ? Abnormal growth of cells (tumors). ? Enlarged tissue at the back of the throat (adenoids). What increases the risk? You are more likely to develop this condition if:  You smoke.  You are overweight.  You are a child who has: ? Certain birth defects of the mouth, such as cleft palate. ? Large tonsils or adenoids. What are the signs or symptoms? Common symptoms of this condition include:  A feeling of fullness in the ear.  Ear pain.  Clicking or popping noises in the ear.  Ringing in the ear.  Hearing loss.  Loss of balance.  Dizziness. Symptoms may get worse when the air pressure around you changes, such as when you travel to an area of high elevation, fly on an airplane, or go scuba diving. How is this diagnosed? This condition may be diagnosed based on:  Your symptoms.  A physical exam of your ears, nose, and throat.  Tests, such as those that measure: ? The movement of your eardrum (tympanogram). ? Your hearing (audiometry). How is this treated? Treatment depends on the cause and severity of your condition.  In mild cases, you may relieve your symptoms by moving air into your ears. This is called "popping the ears."  In more severe cases, or if you have symptoms of fluid in your ears, treatment may include: ? Medicines to relieve congestion (decongestants). ? Medicines that treat allergies (antihistamines). ? Nasal sprays or ear drops that contain medicines that reduce swelling (steroids). ? A procedure to drain the fluid in your eardrum (myringotomy). In this procedure, a small tube is placed in the eardrum  to:  Drain the fluid.  Restore the air in the middle ear space. ? A procedure to insert a balloon device through the nose to inflate the opening of the eustachian tube (balloon dilation). Follow these instructions at home: Lifestyle  Do not do any of the following until your health care provider approves: ? Travel to high altitudes. ? Fly in airplanes. ? Work in a Pension scheme manager or room. ? Scuba dive.  Do not use any products that contain nicotine or tobacco, such as cigarettes and e-cigarettes. If you need help quitting, ask your health care provider.  Keep your ears dry. Wear fitted earplugs during showering and bathing. Dry your ears completely after. General instructions  Take over-the-counter and prescription medicines only as told by your health care provider.  Use techniques to help pop your ears as recommended by your health care provider. These may include: ? Chewing gum. ? Yawning. ? Frequent, forceful swallowing. ? Closing your  mouth, holding your nose closed, and gently blowing as if you are trying to blow air out of your nose.  Keep all follow-up visits as told by your health care provider. This is important. Contact a health care provider if:  Your symptoms do not go away after treatment.  Your symptoms come back after treatment.  You are unable to pop your ears.  You have: ? A fever. ? Pain in your ear. ? Pain in your head or neck. ? Fluid draining from your ear.  Your hearing suddenly changes.  You become very dizzy.  You lose your balance. Summary  Eustachian tube dysfunction refers to a condition in which a blockage develops in the eustachian tube.  It can be caused by ear infections, allergies, inhaled irritants, or abnormal growths in the nose or throat.  Symptoms include ear pain, hearing loss, or ringing in the ears.  Mild cases are treated with maneuvers to unblock the ears, such as yawning or ear popping.  Severe cases are treated  with medicines. Surgery may also be done (rare). This information is not intended to replace advice given to you by your health care provider. Make sure you discuss any questions you have with your health care provider. Document Revised: 01/29/2018 Document Reviewed: 01/29/2018 Elsevier Patient Education  [place].

## 2021-02-22 NOTE — Progress Notes (Signed)
Sodium level is lower than in past, please be sure she is not over hydrating with free water. She does have an ear infection, and may also be overhydrated, please have her cut her intake of water down by 1/2 of what she is currently drinking. She could replace her water with Pedialyte or low sugar electrolyte drink for a few days still decreasing amount.  We need to recheck her sodium 02/23/21 tomorrow please schedule.  If any worsening symptoms she needs to go to be evaluated. Provider placed orders.  She is currently on Synthroid 88 mcg daily, TSH was still low, will decrease this dose to 75 mcg and recheck TSH in 3 months. Sent new script to pharmacy.  Kidney function is normal.  CBC is within normal limits.

## 2021-02-22 NOTE — Progress Notes (Signed)
Established Patient Office Visit  Subjective:  Patient ID: Crystal Lynch, female    DOB: 1941-03-23  Age: 80 y.o. MRN: [id number]  CC: No chief complaint on file.   HPI Blakeley Scheier presents for dizziness.    Subjective:     Crystal Lynch is a 80 y.o. female who presents for evaluation of dizziness. The symptoms started 2 months ago and are unchanged. The attacks occur intermittently and last 3 seconds. Positions that worsen symptoms: none. Previous workup/treatments: none and none . Associated ear symptoms: aural pressure. Associated CNS symptoms: none. Recent infections: none. Head trauma: denied. Drug ingestion: none. Noise exposure: none . Family history: non-contributory and none .  She reports her right ear has " been feeling funny". She does have some sinus drainage and runny nose. Denies any visual changes.  She denies any other symptoms, denies any palpitations, room spinning or dizziness with sudden head movements, or positional changes.   She denies any falls. Denies any head trauma.  She did call her cardiologist regarding blood pressure and was told to continue current regimen and keep log. She denies any low readings.      Review of Systems Constitutional: negative Eyes: negative Ears, nose, mouth, throat, and face: positive for earaches and sinus pressure, ear fullness right ear. rhinorrhea Respiratory: negative Cardiovascular: negative Gastrointestinal: negative Genitourinary:negative Integument/breast: negative Hematologic/lymphatic: negative Musculoskeletal:negative Neurological: negative Behavioral/Psych: negative Endocrine: negative Allergic/Immunologic: negative    Objective:    BP (!) 142/80 (BP Location: Left Arm, Patient Position: Sitting)   Pulse 66   Temp (!) 96.5 F (35.8 C)   Ht 5' 2.01" (1.575 m)   Wt 130 lb 6.4 oz (59.1 kg)   LMP 10/21/1984   SpO2 99%   BMI 23.84 kg/m  General appearance: alert, cooperative and appears  stated age Head: Normocephalic, without obvious abnormality, atraumatic Eyes: positive findings: negative  Ears: abnormal TM right ear - erythematous, bulging and serous middle ear fluid Nose: Nares normal. Septum midline. Mucosa normal. No drainage or sinus tenderness., clear discharge Throat: lips, mucosa, and tongue normal; teeth and gums normal Neck: no adenopathy, no carotid bruit, no JVD, supple, symmetrical, trachea midline and thyroid not enlarged, symmetric, no tenderness/mass/nodules Back: symmetric, no curvature. ROM normal. No CVA tenderness. Lungs: clear to auscultation bilaterally Heart: regular rate and rhythm, S1, S2 normal, no murmur, click, rub or gallop Abdomen: soft, non-tender; bowel sounds normal; no masses,  no organomegaly Extremities: extremities normal, atraumatic, no cyanosis or edema Pulses: 2+ and symmetric Skin: Skin color, texture, turgor normal. No rashes or lesions Lymph nodes: Cervical, supraclavicular, and axillary nodes normal. Neurologic: Alert and oriented X 3, normal strength and tone. Normal symmetric reflexes. Normal coordination and gait      Assessment:   1. Non-recurrent acute serous otitis media of right ear  - Comprehensive metabolic panel - CBC with Differential/Platelet - predniSONE (STERAPRED UNI-PAK 21 TAB) 10 MG (21) TBPK tablet; PO: Take 6 tablets on day 1:Take 5 tablets day 2:Take 4 tablets day 3: Take 3 tablets day 4:Take 2 tablets day five: 5 Take 1 tablet day 6  Dispense: 21 tablet; Refill: 0 - cefdinir (OMNICEF) 300 MG capsule; Take 1 capsule (300 mg total) by mouth 2 (two) times daily.  Dispense: 14 capsule; Refill: 0  2. 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  - predniSONE (STERAPRED UNI-PAK 21 TAB) 10  MG (21) TBPK tablet; PO: Take 6 tablets on day 1:Take 5 tablets day 2:Take 4 tablets day 3: Take 3 tablets day 4:Take 2  tablets day five: 5 Take 1 tablet day 6  Dispense: 21 tablet; Refill: 0  3. Hypothyroidism, unspecified type Will check lab she is on synthroid.  - TSH  4. Episode of dizziness Likely from eustachian tube dysfunction. Increase hydration is advised. Continue to monitor blood pressure for cardiologist, and report any new or changing symptoms at anytime.   Plan:   Red Flags discussed. The patient was given clear instructions to go to ER or return to medical center if any red flags develop, symptoms do not improve, worsen or new problems develop. They verbalized understanding.  Return in about 2 weeks (around 03/08/2021), or if symptoms worsen or fail to improve, for at any time for any worsening symptoms, Go to Emergency room/ urgent care if worse.

## 2021-02-22 NOTE — Progress Notes (Signed)
Meds ordered this encounter  Medications  . levothyroxine (SYNTHROID) 75 MCG tablet    Sig: Take 1 tablet (75 mcg total) by mouth daily.    Dispense:  90 tablet    Refill:  1    Hypothyroidism, unspecified type - Plan: levothyroxine (SYNTHROID) 75 MCG tablet  Hypothyroidism, unspecified type - Plan: levothyroxine (SYNTHROID) 75 MCG tablet  Hyponatremia - Plan: Comprehensive metabolic panel, B Nat Peptide recheck lab sodium on 02/23/21.tomorrow.

## 2021-02-23 ENCOUNTER — Other Ambulatory Visit (INDEPENDENT_AMBULATORY_CARE_PROVIDER_SITE_OTHER): Payer: Medicare Other

## 2021-02-23 ENCOUNTER — Emergency Department: Payer: Medicare Other

## 2021-02-23 ENCOUNTER — Observation Stay
Admission: EM | Admit: 2021-02-23 | Discharge: 2021-02-24 | Disposition: A | Discharge status: Home / Self Care | Payer: Medicare Other | Attending: Internal Medicine | Admitting: Internal Medicine

## 2021-02-23 ENCOUNTER — Other Ambulatory Visit: Payer: Self-pay

## 2021-02-23 DIAGNOSIS — R42 Dizziness and giddiness: Secondary | ICD-10-CM | POA: Diagnosis not present

## 2021-02-23 DIAGNOSIS — E039 Hypothyroidism, unspecified: Secondary | ICD-10-CM | POA: Diagnosis not present

## 2021-02-23 DIAGNOSIS — E871 Hypo-osmolality and hyponatremia: Secondary | ICD-10-CM

## 2021-02-23 DIAGNOSIS — Z79899 Other long term (current) drug therapy: Secondary | ICD-10-CM | POA: Diagnosis not present

## 2021-02-23 DIAGNOSIS — Z20822 Contact with and (suspected) exposure to covid-19: Secondary | ICD-10-CM | POA: Diagnosis not present

## 2021-02-23 DIAGNOSIS — R9431 Abnormal electrocardiogram [ECG] [EKG]: Secondary | ICD-10-CM | POA: Diagnosis not present

## 2021-02-23 DIAGNOSIS — H6591 Unspecified nonsuppurative otitis media, right ear: Secondary | ICD-10-CM | POA: Diagnosis not present

## 2021-02-23 DIAGNOSIS — E7800 Pure hypercholesterolemia, unspecified: Secondary | ICD-10-CM

## 2021-02-23 DIAGNOSIS — I10 Essential (primary) hypertension: Secondary | ICD-10-CM | POA: Insufficient documentation

## 2021-02-23 DIAGNOSIS — H6501 Acute serous otitis media, right ear: Secondary | ICD-10-CM

## 2021-02-23 LAB — COMPREHENSIVE METABOLIC PANEL
ALT: 16 U/L (ref 0–35)
ALT: 22 U/L (ref 0–44)
AST: 12 U/L (ref 0–37)
AST: 22 U/L (ref 15–41)
Albumin: 4.4 g/dL (ref 3.5–5.2)
Albumin: 4.9 g/dL (ref 3.5–5.0)
Alkaline Phosphatase: 72 U/L (ref 39–117)
Alkaline Phosphatase: 66 U/L (ref 38–126)
Anion gap: 10 (ref 5–15)
BUN: 15 mg/dL (ref 6–23)
BUN: 17 mg/dL (ref 8–23)
CO2: 24 mEq/L (ref 19–32)
CO2: 23 mmol/L (ref 22–32)
Calcium: 10.5 mg/dL (ref 8.4–10.5)
Calcium: 10.7 mg/dL — ABNORMAL HIGH (ref 8.9–10.3)
Chloride: 93 mEq/L — ABNORMAL LOW (ref 96–112)
Chloride: 93 mmol/L — ABNORMAL LOW (ref 98–111)
Creatinine, Ser: 0.75 mg/dL (ref 0.40–1.20)
Creatinine, Ser: 0.61 mg/dL (ref 0.44–1.00)
GFR, Estimated: >60 mL/min (ref >60)
GFR: 74.78 mL/min (ref >60.00)
Glucose, Bld: 202 mg/dL — ABNORMAL HIGH (ref 70–99)
Glucose, Bld: 150 mg/dL — ABNORMAL HIGH (ref 70–99)
Potassium: 4.3 mEq/L (ref 3.5–5.1)
Potassium: 4.2 mmol/L (ref 3.5–5.1)
Sodium: 126 mEq/L — ABNORMAL LOW (ref 135–145)
Sodium: 126 mmol/L — ABNORMAL LOW (ref 135–145)
Total Bilirubin: 1.1 mg/dL (ref 0.2–1.2)
Total Bilirubin: 1.6 mg/dL — ABNORMAL HIGH (ref 0.3–1.2)
Total Protein: 6.3 g/dL (ref 6.0–8.3)
Total Protein: 7.5 g/dL (ref 6.5–8.1)

## 2021-02-23 LAB — BRAIN NATRIURETIC PEPTIDE
B Natriuretic Peptide: 124.8 pg/mL — ABNORMAL HIGH (ref 0.0–100.0)
Pro B Natriuretic peptide (BNP): 94 pg/mL (ref 0.0–100.0)

## 2021-02-23 LAB — TSH: TSH: 0.132 u[IU]/mL — ABNORMAL LOW (ref 0.350–4.500)

## 2021-02-23 LAB — MAGNESIUM: Magnesium: 2 mg/dL (ref 1.7–2.4)

## 2021-02-23 LAB — CBC
HCT: 42.7 % (ref 36.0–46.0)
Hemoglobin: 14.7 g/dL (ref 12.0–15.0)
MCH: 31.5 pg (ref 26.0–34.0)
MCHC: 34.4 g/dL (ref 30.0–36.0)
MCV: 91.4 fL (ref 80.0–100.0)
Platelets: 310 10*3/uL (ref 150–400)
RBC: 4.67 MIL/uL (ref 3.87–5.11)
RDW: 12.2 % (ref 11.5–15.5)
WBC: 14.9 10*3/uL — ABNORMAL HIGH (ref 4.0–10.5)
nRBC: 0 % (ref 0.0–0.2)

## 2021-02-23 LAB — RESP PANEL BY RT-PCR (FLU A&B, COVID) ARPGX2
Influenza A by PCR: NEGATIVE
Influenza B by PCR: NEGATIVE
SARS Coronavirus 2 by RT PCR: NEGATIVE

## 2021-02-23 MED ORDER — LISINOPRIL 20 MG PO TABS
40.0000 mg | ORAL_TABLET | Freq: Every day | ORAL | Status: DC
  Administered 2021-02-24: 40 mg via ORAL
  Filled 2021-02-23: qty 2

## 2021-02-23 MED ORDER — PANTOPRAZOLE SODIUM 40 MG PO TBEC
40.0000 mg | DELAYED_RELEASE_TABLET | Freq: Every day | ORAL | Status: DC
  Administered 2021-02-24: 40 mg via ORAL
  Filled 2021-02-23: qty 1

## 2021-02-23 MED ORDER — SIMVASTATIN 10 MG PO TABS
10.0000 mg | ORAL_TABLET | Freq: Every day | ORAL | Status: DC
  Administered 2021-02-24: 10 mg via ORAL
  Filled 2021-02-23 (×2): qty 1

## 2021-02-23 MED ORDER — SODIUM CHLORIDE 0.9 % IV SOLN: Freq: Once | INTRAVENOUS | Status: AC

## 2021-02-23 MED ORDER — MELATONIN 5 MG PO TABS: 2.5000 mg | ORAL_TABLET | Freq: Every evening | ORAL | Status: DC | PRN

## 2021-02-23 MED ORDER — ONDANSETRON HCL 4 MG/2ML IJ SOLN: 4.0000 mg | Freq: Four times a day (QID) | INTRAMUSCULAR | Status: DC | PRN

## 2021-02-23 MED ORDER — ONDANSETRON HCL 4 MG PO TABS: 4.0000 mg | ORAL_TABLET | Freq: Four times a day (QID) | ORAL | Status: DC | PRN

## 2021-02-23 MED ORDER — CARVEDILOL 6.25 MG PO TABS
6.2500 mg | ORAL_TABLET | Freq: Two times a day (BID) | ORAL | Status: DC
  Administered 2021-02-23 – 2021-02-24 (×2): 6.25 mg via ORAL
  Filled 2021-02-23 (×2): qty 1

## 2021-02-23 MED ORDER — LEVOTHYROXINE SODIUM 50 MCG PO TABS
75.0000 ug | ORAL_TABLET | Freq: Every day | ORAL | Status: DC
  Administered 2021-02-24: 75 ug via ORAL
  Filled 2021-02-23: qty 2

## 2021-02-23 MED ORDER — VITAMIN D3 25 MCG (1000 UNIT) PO TABS
1000.0000 [IU] | ORAL_TABLET | Freq: Every day | ORAL | Status: DC
  Administered 2021-02-24: 1000 [IU] via ORAL
  Filled 2021-02-23 (×2): qty 1

## 2021-02-23 MED ORDER — ENOXAPARIN SODIUM 40 MG/0.4ML IJ SOSY
40.0000 mg | PREFILLED_SYRINGE | INTRAMUSCULAR | Status: DC
  Administered 2021-02-23: 40 mg via SUBCUTANEOUS
  Filled 2021-02-23: qty 0.4

## 2021-02-23 MED ORDER — ACETAMINOPHEN 325 MG PO TABS: 650.0000 mg | ORAL_TABLET | Freq: Four times a day (QID) | ORAL | Status: DC | PRN

## 2021-02-23 MED ORDER — LEVOTHYROXINE SODIUM 50 MCG PO TABS: 75.0000 ug | ORAL_TABLET | Freq: Every day | ORAL | Status: DC

## 2021-02-23 MED ORDER — ACETAMINOPHEN 650 MG RE SUPP: 650.0000 mg | Freq: Four times a day (QID) | RECTAL | Status: DC | PRN

## 2021-02-23 MED ORDER — MECLIZINE HCL 12.5 MG PO TABS
12.5000 mg | ORAL_TABLET | Freq: Two times a day (BID) | ORAL | Status: DC | PRN
  Filled 2021-02-23: qty 1

## 2021-02-23 NOTE — Progress Notes (Signed)
Sodium is low, still on recheck, I restricted fluids but she does have dizziness and on omnicef for otitis media. Please advise would you like her to go for IV replacement ?

## 2021-02-23 NOTE — ED Provider Notes (Signed)
[facility] Emergency Department Provider Note  Time seen: 8:55 PM  I have reviewed the triage vital signs and the nursing notes.   HISTORY  Chief Complaint Abnormal Labs   HPI Crystal Lynch is a 80 y.o. female with a past medical history of arthritis, gastric reflux, hypertension, hyperlipidemia, presents to the emergency department for a low-salt level.  According to the patient over the past week or so she has been feeling dizzy which she describes as more of a lightheaded sensation.  Patient saw her doctor yesterday who did lab work and told her that her salt level was low.  She also states that she had fluid in her right ear and prescribed her an antibiotic and prednisone.  Have the patient return today for recheck of labs and they appear to go down once again so she told her to go to the emergency department for evaluation.  Here the patient appears well.  Denies any recent nausea or vomiting.  Largely negative review of systems besides lightheadedness over the past 1 week.   Past Medical History:  Diagnosis Date  . Arthritis   . Chicken pox   . Endometriosis   . Esophagitis    s/p esophageal dilatation  . GERD (gastroesophageal reflux disease)   . History of ovarian cyst   . Hypercholesterolemia   . Hypertension   . Hypothyroidism     Patient Active Problem List   Diagnosis Date Noted  . Episode of dizziness 09/12/2020  . Fall 03/08/2020  . Fatty liver 12/28/2019  . Chest pain 07/25/2019  . Frequent urinary tract infections 12/24/2018  . Acute cystitis without hematuria 10/11/2018  . Hypercalcemia 01/09/2017  . 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 Sleeping difficulties 03/08/2015  . Health care maintenance 01/10/2015  . Osteopenia 08/10/2014  . Hyperbilirubinemia 10/27/2012  . Hematuria 10/27/2012  . Hypertension 10/21/2012  . Hypercholesterolemia 10/21/2012  . Hypothyroidism 10/21/2012  . Esophagitis 10/21/2012    Past Surgical  History:  Procedure Laterality Date  . ABDOMINAL SURGERY     found to have an ovarian cyst and endometriosis  . APPENDECTOMY      Prior to Admission medications   Medication Sig Start Date End Date Taking? Authorizing Provider  Biotin 5000 MCG CAPS Take by mouth.    [provider]  carvedilol (COREG) 6.25 MG tablet TAKE 1 TABLET TWICE A DAY 11/05/20   Wellington Hampshire, MD  cefdinir (OMNICEF) 300 MG capsule Take 1 capsule (300 mg total) by mouth 2 (two) times daily. 02/22/21   Flinchum, Kelby Aline, FNP  Cholecalciferol (VITAMIN D3 PO) Take by mouth daily.    [provider]  dorzolamide-timolol (COSOPT) 22.3-6.8 MG/ML ophthalmic solution Place 1 drop into both eyes 2 (two) times daily.    [provider]  levothyroxine (SYNTHROID) 75 MCG tablet Take 1 tablet (75 mcg total) by mouth daily. 02/22/21   Flinchum, Kelby Aline, FNP  lisinopril (ZESTRIL) 40 MG tablet Take 1 tablet (40 mg total) by mouth daily. 05/20/20   Marrianne Mood D, PA-C  Multiple Vitamins-Minerals (ZINC PO) Take by mouth daily.    [provider]  omeprazole (PRILOSEC) 20 MG capsule Take 1 capsule (20 mg total) by mouth daily. 09/07/20   Einar Pheasant, MD  predniSONE (STERAPRED UNI-PAK 21 TAB) 10 MG (21) TBPK tablet PO: Take 6 tablets on day 1:Take 5 tablets day 2:Take 4 tablets day 3: Take 3 tablets day 4:Take 2 tablets day five: 5 Take 1 tablet day 6  02/22/21   Flinchum, Kelby Aline, FNP  simvastatin (ZOCOR) 10 MG tablet TAKE 1 TABLET AT BEDTIME 11/08/20   Einar Pheasant, MD  spironolactone (ALDACTONE) 25 MG tablet TAKE 1 TABLET DAILY 11/08/20   Marrianne Mood D, PA-C    Allergies  Allergen Reactions  . Meloxicam     BP issues    Family History  Problem Relation Age of Onset  . Lung disease Father   . Heart attack Father   . Heart disease Mother        myocardial infarction  . Breast cancer Mother 1  . Heart attack Mother   . Hypertension Mother   . Hyperlipidemia Mother   .  Breast cancer Sister 84  . Hypertension Sister   . Rheum arthritis Sister   . Breast cancer Maternal Aunt 62  . Hypertension Brother   . Colon cancer Neg Hx     Social History Social History   Tobacco Use  . Smoking status: Never Smoker  . Smokeless tobacco: Never Used  Vaping Use  . Vaping Use: Never used  Substance Use Topics  . Alcohol use: No    Alcohol/week: 0.0 standard drinks  . Drug use: No    Review of Systems Constitutional: Negative for fever.  Lightheadedness x1 week. Cardiovascular: Negative for chest pain. Respiratory: Negative for shortness of breath. Gastrointestinal: Negative for abdominal pain, vomiting and diarrhea. Musculoskeletal: Negative for musculoskeletal complaints Neurological: Negative for headache All other ROS negative  ____________________________________________   PHYSICAL EXAM:  VITAL SIGNS: ED Triage Vitals [02/23/21 1700]  Enc Vitals Group     BP (!) 180/83     Pulse Rate 81     Resp 16     Temp 97.6 F (36.4 C)     Temp Source Oral     SpO2 98 %     Weight 128 lb (58.1 kg)     Height 5\' 2"  (1.575 m)     Head Circumference      Peak Flow      Pain Score 0     Pain Loc      Pain Edu?      Excl. in [place]?    Constitutional: Alert and oriented. Well appearing and in no distress. Eyes: Normal exam ENT      Head: Normocephalic and atraumatic.      Mouth/Throat: Mucous membranes are moist. Cardiovascular: Normal rate, regular rhythm Respiratory: Normal respiratory effort without tachypnea nor retractions. Breath sounds are clear Gastrointestinal: Soft and nontender. No distention.  Musculoskeletal: Nontender with normal range of motion in all extremities.  Neurologic:  Normal speech and language. No gross focal neurologic deficits  Skin:  Skin is warm, dry and intact.  Psychiatric: Mood and affect are normal.   ____________________________________________    EKG  EKG viewed and interpreted by myself shows what appears  to be a sinus rhythm at 145 bpm with a narrow QRS, normal axis, largely normal intervals with nonspecific ST changes.  ____________________________________________    RADIOLOGY  CXR negative  ____________________________________________   INITIAL IMPRESSION / ASSESSMENT AND PLAN / ED COURSE  Pertinent labs & imaging results that were available during my care of the patient were reviewed by me and considered in my medical decision making (see chart for details).   Patient presents to the emergency department for low salt level and dizziness.  Patient states she has been feeling very lightheaded recently and has gotten worse over the past 1 week.  Lab work showed a  sodium of 126 and the patient was referred to the emergency department for further evaluation.  Here the patient appears well, reassuring physical exam.  Patient's lab work reviewed by myself shows 126 level earlier today.  We will recheck labs and begin on a normal sodium infusion.  Patient will require admission to the hospitalist service given her symptomatic hyponatremia.  Patient agreeable to plan of care.  Crystal Lynch was evaluated in Emergency Department on 02/23/2021 for the symptoms described in the history of present illness. She was evaluated in the context of the global COVID-19 pandemic, which necessitated consideration that the patient might be at risk for infection with the SARS-CoV-2 virus that causes COVID-19. Institutional protocols and algorithms that pertain to the evaluation of patients at risk for COVID-19 are in a state of rapid change based on information released by regulatory bodies including the CDC and federal and state organizations. These policies and algorithms were followed during the patient's care in the ED.  ____________________________________________   FINAL CLINICAL IMPRESSION(S) / ED DIAGNOSES  Hyponatremia Dizziness   Harvest Dark, MD 02/23/21 2202

## 2021-02-23 NOTE — ED Triage Notes (Signed)
See first nurse note- pt here with low sodium. Pt reports having labs drawn today and sodium level was 126. Pt denies pain but does report dizziness.

## 2021-02-23 NOTE — H&P (Addendum)
History and Physical   Crystal Lynch ZYS:[id number] DOB: Feb 23, 1941 DOA: 02/23/2021  PCP: Einar Pheasant, MD  Outpatient Specialists: Dr. Fletcher Anon, cardiology Patient coming from: PCP  I have personally briefly reviewed patient's old medical records in [place].  Chief Concern: Dizziness  HPI: Crystal Lynch is a 80 y.o. female with medical history significant for hypertension, hypothyroid, GERD, hyperlipidemia, presents to the emergency department for chief concerns of dizziness from her PCP.  She has been experience dizziness off and on for several weeks and worsening for the last couple of weeks. She reports the dizziness is associated with movement, especially with long car rides to visit daughter in [place], [place].   She endorses dizziness with trips to mountains, about two hours away. She reports that the dizziness improves with dramamine.   She reports that the last time she felt lightheaded and unstable was while walking at [employer].   She denies recent cruise.  She endorses drinking a lot of water lately and endorses frequent urination.  She denies loss of consciousness or syncope, vomiting, diarrhea, dysuria, blood in her stool.  She reports the last time she had a colonoscopy she was told that she has aged out of a need for colonoscopy.  Social history: She lives at home with husband. She is retired and formerly worked for [employer].  She denies history of tobacco use, etoh use, recreational drug use. She volunteers with [employer].   Vaccination: she is fully vaccinated, with booster  Family history: mother and sister both had breast cancer.  ROS: Constitutional: no weight change, no fever ENT/Mouth: no sore throat, no rhinorrhea Eyes: no eye pain, no vision changes Cardiovascular: no chest pain, no dyspnea,  no edema, no palpitations Respiratory: no cough, no sputum, no wheezing Gastrointestinal: no nausea, no vomiting, no diarrhea, no  constipation Genitourinary: no urinary incontinence, no dysuria, no hematuria, + polyuria Musculoskeletal: no arthralgias, no myalgias Skin: no skin lesions, no pruritus, Neuro: + weakness, no loss of consciousness, no syncope, she endorses dizziness Psych: no anxiety, no depression, + decrease appetite Heme/Lymph: no bruising, no bleeding  ED Course: Discussed with the emergency medicine provider, patient requiring hospitalization for hyponatremia. Vitals in the emergency department was remarkable for temperature of 98, respiration rate of 16, heart rate 68, blood pressure 174/66, SPO2 of 98% on room air.  Labs in the emergency department was remarkable for sodium 126, potassium 4.2, chloride 93, bicarb 23, BUN 17, serum creatinine of 0.61, nonfasting blood glucose 150, WBC 14.9, hemoglobin 14.7, platelets 310.  Assessment/Plan  Active Problems:   Hypertension   Hypercholesterolemia   Hypothyroidism   Episode of dizziness   Hyponatremia   Prolonged QT interval   Euvolemic hyponatremia-etiology work-up in progress Symptomatic moderate acute hyponatremia - Differentials include insufficient dosing of thyroid medication, psychogenic polydipsia, in setting of diuretic, spironolactone use - Status post normal saline 250 milliliters bolus per EDP - We will check random urine sodium level, urine osmole, serum osmolality - Recheck serum sodium level after completion of 250 ml sodium bolus ordered - Check TSH, UA - Admit to MedSurg, observation, with telemetry  Dizziness- query motion sickness - Meclizine 12.5 mg p.o. twice daily as needed for dizziness and nausea  Hypertension- elevated - Resumed home carvedilol 6.25 mg p.o. twice daily, lisinopril 40 mg daily - Holding home spironolactone 25 mg daily in setting of hyponatremia  Hypothyroid-resume levothyroxine 75 mcg daily  GERD-PPI 40 mg daily  Prolonged QT - gerd  COVID PCR/influenza A influenza B results are  pending  Bilateral ear cerumen-not able to visualize tympanic membrane fully, mild redness in the right ear  Chart reviewed.   Patient presented to her PCPs office and was seen by midlevel and was prescribed cefdinir 300 mg twice daily for nonrecurrent acute serous otitis media of the right ear, patient took her medications today  DVT prophylaxis: Enoxaparin 40 mg subcutaneous every 24 hours Code Status: Full code Diet: Heart healthy Family Communication: Updated spouse at bedside Disposition Plan: Pending clinical course Consults called: None at this time Admission status: Observation, MedSurg, with telemetry  Past Medical History:  Diagnosis Date  . Arthritis   . Chicken pox   . Endometriosis   . Esophagitis    s/p esophageal dilatation  . GERD (gastroesophageal reflux disease)   . History of ovarian cyst   . Hypercholesterolemia   . Hypertension   . Hypothyroidism    Past Surgical History:  Procedure Laterality Date  . ABDOMINAL SURGERY     found to have an ovarian cyst and endometriosis  . APPENDECTOMY     Social History:  reports that she has never smoked. She has never used smokeless tobacco. She reports that she does not drink alcohol and does not use drugs.  Allergies  Allergen Reactions  . Meloxicam     BP issues   Family History  Problem Relation Age of Onset  . Lung disease Father   . Heart attack Father   . Heart disease Mother        myocardial infarction  . Breast cancer Mother 85  . Heart attack Mother   . Hypertension Mother   . Hyperlipidemia Mother   . Breast cancer Sister 71  . Hypertension Sister   . Rheum arthritis Sister   . Breast cancer Maternal Aunt 4  . Hypertension Brother   . Colon cancer Neg Hx    Family history: Family history reviewed and not pertinent  Prior to Admission medications   Medication Sig Start Date End Date Taking? Authorizing Provider  Biotin 5000 MCG CAPS Take by mouth.    [provider]   carvedilol (COREG) 6.25 MG tablet TAKE 1 TABLET TWICE A DAY 11/05/20   Wellington Hampshire, MD  cefdinir (OMNICEF) 300 MG capsule Take 1 capsule (300 mg total) by mouth 2 (two) times daily. 02/22/21   Flinchum, Kelby Aline, FNP  Cholecalciferol (VITAMIN D3 PO) Take by mouth daily.    [provider]  dorzolamide-timolol (COSOPT) 22.3-6.8 MG/ML ophthalmic solution Place 1 drop into both eyes 2 (two) times daily.    [provider]  levothyroxine (SYNTHROID) 75 MCG tablet Take 1 tablet (75 mcg total) by mouth daily. 02/22/21   Flinchum, Kelby Aline, FNP  lisinopril (ZESTRIL) 40 MG tablet Take 1 tablet (40 mg total) by mouth daily. 05/20/20   Marrianne Mood D, PA-C  Multiple Vitamins-Minerals (ZINC PO) Take by mouth daily.    [provider]  omeprazole (PRILOSEC) 20 MG capsule Take 1 capsule (20 mg total) by mouth daily. 09/07/20   Einar Pheasant, MD  predniSONE (STERAPRED UNI-PAK 21 TAB) 10 MG (21) TBPK tablet PO: Take 6 tablets on day 1:Take 5 tablets day 2:Take 4 tablets day 3: Take 3 tablets day 4:Take 2 tablets day five: 5 Take 1 tablet day 6 02/22/21   Flinchum, Kelby Aline, FNP  simvastatin (ZOCOR) 10 MG tablet TAKE 1 TABLET AT BEDTIME 11/08/20   Einar Pheasant, MD  spironolactone (ALDACTONE) 25 MG  tablet TAKE 1 TABLET DAILY 11/08/20   Marrianne Mood D, PA-C   Physical Exam: Vitals:   02/23/21 2130 02/23/21 2149 02/23/21 2200 02/23/21 2215  BP: (!) 174/66 (!) 174/66 (!) 155/64   Pulse: 69  61 65  Resp: (!) 21  13 16   Temp:      TempSrc:      SpO2: 99%  98% 99%  Weight:      Height:       Constitutional: appears age-appropriate, NAD, calm, comfortable Eyes: PERRL, lids and conjunctivae normal ENMT: Mucous membranes are moist. Posterior pharynx clear of any exudate or lesions. Age-appropriate dentition. Hearing appropriate.  Bilateral ear showed diffuse cerumen not able to visualize tympanic membrane bilaterally Neck: normal, supple, no masses, no  thyromegaly Respiratory: clear to auscultation bilaterally, no wheezing, no crackles. Normal respiratory effort. No accessory muscle use.  Cardiovascular: Regular rate and rhythm, no murmurs / rubs / gallops. No extremity edema. 2+ pedal pulses. No carotid bruits.  Abdomen: no tenderness, no masses palpated, no hepatosplenomegaly. Bowel sounds positive.  Musculoskeletal: no clubbing / cyanosis. No joint deformity upper and lower extremities. Good ROM, no contractures, no atrophy. Normal muscle tone.  Skin: no rashes, lesions, ulcers. No induration Neurologic: Sensation intact. Strength 5/5 in all 4.  Psychiatric: Normal judgment and insight. Alert and oriented x 3. Normal mood.   EKG: independently reviewed, showing EKG done at her outpatient clinic showed sinus rhythm with rate of 78, QTc 506  Chest x-ray on Admission: I personally reviewed and I agree with radiologist reading as below.  DG Chest Portable 1 View  Result Date: 02/23/2021 CLINICAL DATA:  Dizziness EXAM: PORTABLE CHEST 1 VIEW COMPARISON:  03/28/2016 FINDINGS: No focal consolidation or effusion. Mild bronchitic changes. Normal cardiomediastinal silhouette. Aortic atherosclerosis. No pneumothorax. Mild atelectasis or scarring left base. IMPRESSION: No active disease. Mild bronchitic changes and scarring or atelectasis left base Electronically Signed   By: Donavan Foil M.D.   On: 02/23/2021 21:25   Labs on Admission: I have personally reviewed following labs  CBC: Recent Labs  Lab 02/22/21 1126 02/23/21 2113  WBC 6.9 14.9*  NEUTROABS 4.0  --   HGB 14.0 14.7  HCT 41.2 42.7  MCV 93.1 91.4  PLT 269.0 [id number]   Basic Metabolic Panel: Recent Labs  Lab 02/22/21 1126 02/23/21 1120 02/23/21 2113  NA 127* 126* 126*  K 4.3 4.3 4.2  CL 94* 93* 93*  CO2 25 24 23   GLUCOSE 88 202* 150*  BUN 13 15 17   CREATININE 0.72 0.75 0.61  CALCIUM 10.2 10.5 10.7*  MG  --   --  2.0   GFR: Estimated Creatinine Clearance: 43.6 mL/min (by  C-G formula based on SCr of 0.61 mg/dL).  Liver Function Tests: Recent Labs  Lab 02/22/21 1126 02/23/21 1120 02/23/21 2113  AST 14 12 22   ALT 18 16 22   ALKPHOS 65 72 66  BILITOT 1.2 1.1 1.6*  PROT 6.3 6.3 7.5  ALBUMIN 4.5 4.4 4.9   BNP (last 3 results) Recent Labs    02/23/21 1120  PROBNP 94.0   Thyroid Function Tests: Recent Labs    02/22/21 1126  TSH 0.34*   Anemia Panel: No results for input(s): VITAMINB12, FOLATE, FERRITIN, TIBC, IRON, RETICCTPCT in the last 72 hours.  Urine analysis:    Component Value Date/Time   COLORURINE YELLOW 09/13/2020 1451   APPEARANCEUR Sl Cloudy (A) 09/13/2020 1451   APPEARANCEUR Hazy (A) 05/06/2019 1108   LABSPEC 1.010 09/13/2020 1451  PHURINE 6.0 09/13/2020 1451   GLUCOSEU NEGATIVE 09/13/2020 1451   HGBUR SMALL (A) 09/13/2020 1451   BILIRUBINUR NEGATIVE 09/13/2020 1451   BILIRUBINUR Negative 05/06/2019 1108   [place] 09/13/2020 1451   PROTEINUR Negative 05/06/2019 1108   UROBILINOGEN 0.2 09/13/2020 1451   NITRITE NEGATIVE 09/13/2020 1451   LEUKOCYTESUR TRACE (A) 09/13/2020 1451   Devon Pretty N Payzlee Ryder D.O. [facility]  If 7PM-7AM, please contact overnight-coverage provider If 7AM-7PM, please contact day coverage provider [email or web address]  02/23/2021, 10:52 PM

## 2021-02-23 NOTE — ED Triage Notes (Addendum)
First Nurse Note:  ARrives for NA replacement.  States had labs drawn today and yesterday and was sent to ED for fluids.  NA was 126 from today.  AAOx3.  Skin warm and dry. NAD

## 2021-02-24 DIAGNOSIS — E039 Hypothyroidism, unspecified: Secondary | ICD-10-CM | POA: Diagnosis not present

## 2021-02-24 DIAGNOSIS — H6501 Acute serous otitis media, right ear: Secondary | ICD-10-CM

## 2021-02-24 DIAGNOSIS — E871 Hypo-osmolality and hyponatremia: Secondary | ICD-10-CM

## 2021-02-24 DIAGNOSIS — R42 Dizziness and giddiness: Secondary | ICD-10-CM

## 2021-02-24 LAB — URINALYSIS, COMPLETE (UACMP) WITH MICROSCOPIC
Bacteria, UA: NONE SEEN
Bilirubin Urine: NEGATIVE
Glucose, UA: NEGATIVE mg/dL
Ketones, ur: NEGATIVE mg/dL
Leukocytes,Ua: NEGATIVE
Nitrite: NEGATIVE
Protein, ur: NEGATIVE mg/dL
Specific Gravity, Urine: 1.004 — ABNORMAL LOW (ref 1.005–1.030)
Squamous Epithelial / HPF: NONE SEEN (ref 0–5)
pH: 7 (ref 5.0–8.0)

## 2021-02-24 LAB — CBC
HCT: 39.8 % (ref 36.0–46.0)
Hemoglobin: 13.8 g/dL (ref 12.0–15.0)
MCH: 31.2 pg (ref 26.0–34.0)
MCHC: 34.7 g/dL (ref 30.0–36.0)
MCV: 90 fL (ref 80.0–100.0)
Platelets: 259 10*3/uL (ref 150–400)
RBC: 4.42 MIL/uL (ref 3.87–5.11)
RDW: 12.3 % (ref 11.5–15.5)
WBC: 11.3 10*3/uL — ABNORMAL HIGH (ref 4.0–10.5)
nRBC: 0 % (ref 0.0–0.2)

## 2021-02-24 LAB — SODIUM, URINE, RANDOM: Sodium, Ur: 67 mmol/L

## 2021-02-24 LAB — BASIC METABOLIC PANEL
Anion gap: 7 (ref 5–15)
BUN: 14 mg/dL (ref 8–23)
CO2: 24 mmol/L (ref 22–32)
Calcium: 10.1 mg/dL (ref 8.9–10.3)
Chloride: 101 mmol/L (ref 98–111)
Creatinine, Ser: 0.62 mg/dL (ref 0.44–1.00)
GFR, Estimated: >60 mL/min (ref >60)
Glucose, Bld: 119 mg/dL — ABNORMAL HIGH (ref 70–99)
Potassium: 4.7 mmol/L (ref 3.5–5.1)
Sodium: 132 mmol/L — ABNORMAL LOW (ref 135–145)

## 2021-02-24 LAB — OSMOLALITY: Osmolality: 275 mOsm/kg (ref 275–295)

## 2021-02-24 LAB — OSMOLALITY, URINE: Osmolality, Ur: 238 mOsm/kg — ABNORMAL LOW (ref 300–900)

## 2021-02-24 MED ORDER — CEFDINIR 300 MG PO CAPS
300.0000 mg | ORAL_CAPSULE | Freq: Two times a day (BID) | ORAL | Status: DC
  Administered 2021-02-24: 300 mg via ORAL
  Filled 2021-02-24 (×2): qty 1

## 2021-02-24 MED ORDER — PREDNISONE 20 MG PO TABS
40.0000 mg | ORAL_TABLET | Freq: Every day | ORAL | Status: AC
  Administered 2021-02-24: 40 mg via ORAL
  Filled 2021-02-24: qty 2

## 2021-02-24 MED ORDER — PREDNISONE 20 MG PO TABS: 30.0000 mg | ORAL_TABLET | Freq: Every day | ORAL | Status: DC

## 2021-02-24 MED ORDER — PREDNISONE 20 MG PO TABS: 10.0000 mg | ORAL_TABLET | Freq: Every day | ORAL | Status: DC

## 2021-02-24 MED ORDER — PREDNISONE 20 MG PO TABS: 20.0000 mg | ORAL_TABLET | Freq: Every day | ORAL | Status: DC

## 2021-02-24 NOTE — Progress Notes (Incomplete)
Patient given discharge

## 2021-02-24 NOTE — Discharge Summary (Signed)
[facility] [place] NAME: Tenisha Fleece    MR#:  [id number]  DATE OF BIRTH:  [birth year]  DATE OF ADMISSION:  02/23/2021 ADMITTING PHYSICIAN: Amy N Cox, DO  DATE OF DISCHARGE: 02/24/2021  PRIMARY CARE PHYSICIAN: Einar Pheasant, MD    ADMISSION DIAGNOSIS:  Dizziness [R42] Hyponatremia [E87.1]  DISCHARGE DIAGNOSIS:  Hyponatremia--improving Right serous otitis media  SECONDARY DIAGNOSIS:   Past Medical History:  Diagnosis Date  . Arthritis   . Chicken pox   . Endometriosis   . Esophagitis    s/p esophageal dilatation  . GERD (gastroesophageal reflux disease)   . History of ovarian cyst   . Hypercholesterolemia   . Hypertension   . Hypothyroidism     HOSPITAL COURSE:   Chanetta Moosman is a 80 y.o. female with medical history significant for hypertension, hypothyroid, GERD, hyperlipidemia, presents to the emergency department for chief concerns of dizziness from her PCP's office.  She endorses drinking a lot of water lately and endorses frequent urination.    Euvolemic hyponatremia-etiology unclear. Patient has lately been drinking excessive water due to leg cramps which could be contributing along with her meds. -- Advised patient to drink Pedialyte/Gatorade to ensure electrolytes to get drinking plain excessive -received IV fluids. -- Sodium up to 132. Patient appears hemodynamically stable.- He ambulated with mobility therapist about 700 feet. Do not have any major issues. She has a walker and a cane if needed. I told her to keep it by her side in case she needs it. No recent falls.  Dizziness- query motion sickness - Meclizine 12.5 mg p.o. twice daily as needed for dizziness and nausea  Hypertension- elevated -  continue Coreg lisinopril and Spironolactone  Hypothyroid-resume levothyroxine 75 mcg daily  GERD-PPI 40 mg daily  Overall stable. Discharge to home. Husband was was at bedside voiced understanding. CONSULTS  OBTAINED:    DRUG ALLERGIES:   Allergies  Allergen Reactions  . Meloxicam     BP issues    DISCHARGE MEDICATIONS:   Allergies as of 02/24/2021      Reactions   Meloxicam    BP issues      Medication List    TAKE these medications   Biotin 5 MG Caps Take 5,000 mcg by mouth daily.   carvedilol 6.25 MG tablet Commonly known as: COREG TAKE 1 TABLET TWICE A DAY What changed: when to take this   cefdinir 300 MG capsule Commonly known as: OMNICEF Take 1 capsule (300 mg total) by mouth 2 (two) times daily.   cholecalciferol 25 MCG (1000 UNIT) tablet Commonly known as: VITAMIN D Take 1,000 Units by mouth daily.   dorzolamide-timolol 22.3-6.8 MG/ML ophthalmic solution Commonly known as: COSOPT Place 1 drop into both eyes 2 (two) times daily.   levothyroxine 75 MCG tablet Commonly known as: SYNTHROID Take 1 tablet (75 mcg total) by mouth daily.   lisinopril 40 MG tablet Commonly known as: ZESTRIL Take 1 tablet (40 mg total) by mouth daily.   omeprazole 20 MG capsule Commonly known as: PRILOSEC Take 1 capsule (20 mg total) by mouth daily.   predniSONE 10 MG (21) Tbpk tablet Commonly known as: STERAPRED UNI-PAK 21 TAB PO: Take 6 tablets on day 1:Take 5 tablets day 2:Take 4 tablets day 3: Take 3 tablets day 4:Take 2 tablets day five: 5 Take 1 tablet day 6   simvastatin 10 MG tablet Commonly known as: ZOCOR TAKE 1 TABLET AT BEDTIME   spironolactone 25  MG tablet Commonly known as: ALDACTONE TAKE 1 TABLET DAILY   ZINC PO Take 1 tablet by mouth daily.       If you experience worsening of your admission symptoms, develop shortness of breath, life threatening emergency, suicidal or homicidal thoughts you must seek medical attention immediately by calling 911 or calling your MD immediately  if symptoms less severe.  You Must read complete instructions/literature along with all the possible adverse reactions/side effects for all the Medicines you take and that have  been prescribed to you. Take any new Medicines after you have completely understood and accept all the possible adverse reactions/side effects.   Please note  You were cared for by a hospitalist during your hospital stay. If you have any questions about your discharge medications or the care you received while you were in the hospital after you are discharged, you can call the unit and asked to speak with the hospitalist on call if the hospitalist that took care of you is not available. Once you are discharged, your primary care physician will handle any further medical issues. Please note that NO REFILLS for any discharge medications will be authorized once you are discharged, as it is imperative that you return to your primary care physician (or establish a relationship with a primary care physician if you do not have one) for your aftercare needs so that they can reassess your need for medications and monitor your lab values. Today   SUBJECTIVE    Feels ok VITAL SIGNS:  Blood pressure 105/64, pulse 77, temperature 97.9 F (36.6 C), temperature source Oral, resp. rate 18, height 5\' 2"  (1.575 m), weight 58.1 kg, last menstrual period 10/21/1984, SpO2 95 %.  I/O:    Intake/Output Summary (Last 24 hours) at 02/24/2021 1314 Last data filed at 02/24/2021 1031 Gross per 24 hour  Intake 240 ml  Output 500 ml  Net -260 ml    PHYSICAL EXAMINATION:  GENERAL:  80 y.o.-year-old patient lying in the bed with no acute distress.  LUNGS: Normal breath sounds bilaterally, no wheezing, rales,rhonchi or crepitation. No use of accessory muscles of respiration.  CARDIOVASCULAR: S1, S2 normal. No murmurs, rubs, or gallops.  ABDOMEN: Soft, non-tender, non-distended. Bowel sounds present. No organomegaly or mass.  EXTREMITIES: No pedal edema, cyanosis, or clubbing.  NEUROLOGIC: Cranial nerves II through XII are intact. Muscle strength 5/5 in all extremities. Sensation intact. Gait not checked.  PSYCHIATRIC:  The patient is alert and oriented x 3.  SKIN: No obvious rash, lesion, or ulcer.   DATA REVIEW:   CBC  Recent Labs  Lab 02/24/21 0435  WBC 11.3*  HGB 13.8  HCT 39.8  PLT 259    Chemistries  Recent Labs  Lab 02/23/21 2113 02/24/21 0435  NA 126* 132*  K 4.2 4.7  CL 93* 101  CO2 23 24  GLUCOSE 150* 119*  BUN 17 14  CREATININE 0.61 0.62  CALCIUM 10.7* 10.1  MG 2.0  --   AST 22  --   ALT 22  --   ALKPHOS 66  --   BILITOT 1.6*  --     Microbiology Results   Recent Results (from the past 240 hour(s))  Resp Panel by RT-PCR (Flu A&B, Covid) Nasopharyngeal Swab     Status: None   Collection Time: 02/23/21  9:14 PM   Specimen: Nasopharyngeal Swab; Nasopharyngeal(NP) swabs in vial transport medium  Result Value Ref Range Status   SARS Coronavirus 2 by RT PCR NEGATIVE NEGATIVE Final  Comment: (NOTE) SARS-CoV-2 target nucleic acids are NOT DETECTED.  The SARS-CoV-2 RNA is generally detectable in upper respiratory specimens during the acute phase of infection. The lowest concentration of SARS-CoV-2 viral copies this assay can detect is 138 copies/mL. A negative result does not preclude SARS-Cov-2 infection and should not be used as the sole basis for treatment or other patient management decisions. A negative result may occur with  improper specimen collection/handling, submission of specimen other than nasopharyngeal swab, presence of viral mutation(s) within the areas targeted by this assay, and inadequate number of viral copies(<138 copies/mL). A negative result must be combined with clinical observations, patient history, and epidemiological information. The expected result is Negative.  Fact Sheet for Patients:  EntrepreneurPulse.com.au  Fact Sheet for Healthcare Providers:  IncredibleEmployment.be  This test is no t yet approved or cleared by the [place] FDA and  has been authorized for detection and/or diagnosis of  SARS-CoV-2 by FDA under an Emergency Use Authorization (EUA). This EUA will remain  in effect (meaning this test can be used) for the duration of the COVID-19 declaration under Section 564(b)(1) of the Act, 21 U.S.C.section 360bbb-3(b)(1), unless the authorization is terminated  or revoked sooner.       Influenza A by PCR NEGATIVE NEGATIVE Final   Influenza B by PCR NEGATIVE NEGATIVE Final    Comment: (NOTE) The Xpert Xpress SARS-CoV-2/FLU/RSV plus assay is intended as an aid in the diagnosis of influenza from Nasopharyngeal swab specimens and should not be used as a sole basis for treatment. Nasal washings and aspirates are unacceptable for Xpert Xpress SARS-CoV-2/FLU/RSV testing.  Fact Sheet for Patients: EntrepreneurPulse.com.au  Fact Sheet for Healthcare Providers: IncredibleEmployment.be  This test is not yet approved or cleared by the [place] FDA and has been authorized for detection and/or diagnosis of SARS-CoV-2 by FDA under an Emergency Use Authorization (EUA). This EUA will remain in effect (meaning this test can be used) for the duration of the COVID-19 declaration under Section 564(b)(1) of the Act, 21 U.S.C. section 360bbb-3(b)(1), unless the authorization is terminated or revoked.  Performed at [facility], [address] [place], [place] [address]     RADIOLOGY:  DG Chest Portable 1 View  Result Date: 02/23/2021 CLINICAL DATA:  Dizziness EXAM: PORTABLE CHEST 1 VIEW COMPARISON:  03/28/2016 FINDINGS: No focal consolidation or effusion. Mild bronchitic changes. Normal cardiomediastinal silhouette. Aortic atherosclerosis. No pneumothorax. Mild atelectasis or scarring left base. IMPRESSION: No active disease. Mild bronchitic changes and scarring or atelectasis left base Electronically Signed   By: Donavan Foil M.D.   On: 02/23/2021 21:25     CODE STATUS:     Code Status Orders  (From admission, onward)          Start     Ordered   02/23/21 2216  Full code  Continuous        02/23/21 2216        Code Status History    Date Active Date Inactive Code Status Order ID Comments User Context   08/18/2015 1346 08/18/2015 1913 Full Code [id number]  Wellington Hampshire, MD Inpatient   Advance Care Planning Activity    Advance Directive Documentation   Flowsheet Row Most Recent Value  Type of Advance Directive Healthcare Power of [place], Living will  Pre-existing out of facility DNR order (yellow form or pink MOST form) --  "MOST" Form in Place? --       TOTAL TIME TAKING CARE OF THIS PATIENT: *40* minutes.  Fritzi Mandes M.D  [facility]    CC: Primary care physician; Einar Pheasant, MD

## 2021-02-24 NOTE — Discharge Instructions (Signed)
Restrict water drinking upto 1200 cc /day

## 2021-02-24 NOTE — Progress Notes (Signed)
Mobility Specialist - Progress Note   02/24/21 1100  Mobility  Activity Ambulated in hall  Level of Assistance Standby assist, set-up cues, supervision of patient - no hands on  Assistive Device Front wheel walker  Distance Ambulated (ft) 700 ft  Mobility Response Tolerated well  Mobility performed by Mobility specialist  $Mobility charge 1 Mobility    Pre-mobility: 74 HR, 95% SpO2 During mobility: 85 HR, 97% SpO2 Post-mobility: 68 HR, 98% SpO2   Pt ambulated in hallway with RW. No LOB. Dizzy when transferred supine-EOB. BP 142/72. VC needed for standing when safe to do so (dizziness resolved, RW in place, etc). Denied SOB on RA, O2 maintained high 90s. Pt reports dizziness only with head turns during ambulation, resulting in mild unsteadiness, otherwise ambulates with supervision. ModI with bed mobility. Family at bedside.    Kathee Delton Mobility Specialist 02/24/21, 11:33 AM

## 2021-03-01 ENCOUNTER — Telehealth: Payer: Self-pay

## 2021-03-01 NOTE — Telephone Encounter (Signed)
Transition Care Management Follow-up Telephone Call  Date of discharge and from where: 02/24/21 from [facility] at [facility].  How have you been since you were released from the hospital? Patient states, "I am drinking plenty of gatorade and dizziness has improved but still present." Cane in use as needed. Denies n/v/d, falls. BP wnl. Notes seen by ENT today for 6 month ear cleaning, routine visit.  Plans to eat regular diet without too much focus on decreasing salt intake.   Any questions or concerns? No  Items Reviewed:  Did the pt receive and understand the discharge instructions provided? Yes   Medications obtained and verified? Yes   Other? No   Any new allergies since your discharge? No   Dietary orders reviewed? Yes  Do you have support at home? Yes   Home Care and Equipment/Supplies: Were home health services ordered? No  Functional Questionnaire: (I = Independent and D = Dependent) ADLs: Husband assists as needed.   Transferring/Ambulation- cane/walker  Follow up appointments reviewed:   PCP Hospital f/u appt confirmed? Yes  Scheduled to see Dr. Nicki Reaper on 03/02/21 @ 12:00.  Are transportation arrangements needed? No   If their condition worsens, is the pt aware to call PCP or go to the Emergency Dept.? Yes  Was the patient provided with contact information for the PCP's office or ED? Yes  Was to pt encouraged to call back with questions or concerns? Yes

## 2021-03-01 NOTE — Telephone Encounter (Signed)
Crystal Lynch has appt scheduled with me 03/02/21.  Per note, she just saw ENT.  Need office note - see if can get prior to her appt with me.

## 2021-03-02 ENCOUNTER — Other Ambulatory Visit: Payer: Self-pay

## 2021-03-02 ENCOUNTER — Encounter: Payer: Self-pay | Admitting: Internal Medicine

## 2021-03-02 ENCOUNTER — Ambulatory Visit (INDEPENDENT_AMBULATORY_CARE_PROVIDER_SITE_OTHER): Payer: Medicare Other | Admitting: Internal Medicine

## 2021-03-02 VITALS — BP 110/62 | HR 56 | Temp 95.4°F | Ht 62.0 in | Wt 132.8 lb

## 2021-03-02 DIAGNOSIS — R42 Dizziness and giddiness: Secondary | ICD-10-CM

## 2021-03-02 DIAGNOSIS — I10 Essential (primary) hypertension: Secondary | ICD-10-CM | POA: Diagnosis not present

## 2021-03-02 DIAGNOSIS — E871 Hypo-osmolality and hyponatremia: Secondary | ICD-10-CM | POA: Diagnosis not present

## 2021-03-02 DIAGNOSIS — E7800 Pure hypercholesterolemia, unspecified: Secondary | ICD-10-CM

## 2021-03-02 DIAGNOSIS — K760 Fatty (change of) liver, not elsewhere classified: Secondary | ICD-10-CM | POA: Diagnosis not present

## 2021-03-02 DIAGNOSIS — E039 Hypothyroidism, unspecified: Secondary | ICD-10-CM

## 2021-03-02 LAB — CBC WITH DIFFERENTIAL/PLATELET
Basophils Absolute: 0 10*3/uL (ref 0.0–0.1)
Basophils Relative: 0.4 % (ref 0.0–3.0)
Eosinophils Absolute: 0.2 10*3/uL (ref 0.0–0.7)
Eosinophils Relative: 2.4 % (ref 0.0–5.0)
HCT: 42.4 % (ref 36.0–46.0)
Hemoglobin: 14.4 g/dL (ref 12.0–15.0)
Lymphocytes Relative: 24.9 % (ref 12.0–46.0)
Lymphs Abs: 2 10*3/uL (ref 0.7–4.0)
MCHC: 34 g/dL (ref 30.0–36.0)
MCV: 93.1 fl (ref 78.0–100.0)
Monocytes Absolute: 0.7 10*3/uL (ref 0.1–1.0)
Monocytes Relative: 8.8 % (ref 3.0–12.0)
Neutro Abs: 5 10*3/uL (ref 1.4–7.7)
Neutrophils Relative %: 63.5 % (ref 43.0–77.0)
Platelets: 297 10*3/uL (ref 150.0–400.0)
RBC: 4.55 Mil/uL (ref 3.87–5.11)
RDW: 13.1 % (ref 11.5–15.5)
WBC: 7.9 10*3/uL (ref 4.0–10.5)

## 2021-03-02 LAB — BASIC METABOLIC PANEL
BUN: 16 mg/dL (ref 6–23)
CO2: 28 mEq/L (ref 19–32)
Calcium: 10 mg/dL (ref 8.4–10.5)
Chloride: 96 mEq/L (ref 96–112)
Creatinine, Ser: 0.77 mg/dL (ref 0.40–1.20)
GFR: 72.44 mL/min (ref >60.00)
Glucose, Bld: 96 mg/dL (ref 70–99)
Potassium: 4.3 mEq/L (ref 3.5–5.1)
Sodium: 130 mEq/L — ABNORMAL LOW (ref 135–145)

## 2021-03-02 LAB — TSH: TSH: 0.71 u[IU]/mL (ref 0.35–4.50)

## 2021-03-02 NOTE — Progress Notes (Signed)
Patient ID: Crystal Lynch, female   DOB: [birth year], 80 y.o.   MRN: [id number]   Subjective:    Patient ID: Crystal Lynch, female    DOB: 1941/03/23, 80 y.o.   MRN: [id number]  HPI This visit occurred during the SARS-CoV-2 public health emergency.  Safety protocols were in place, including screening questions prior to the visit, additional usage of staff PPE, and extensive cleaning of exam room while observing appropriate contact time as indicated for disinfecting solutions.  Patient here for hospital follow up.  Hospitalized 02/23/21 - 02/24/21.  Presented with dizziness and low sodium.  Had been having leg cramps and drinking an increased amount of water.  Received IVFs.  Was given meclizine to help with dizziness. Continued on coreg and spironolactone.  She comes in today stating that she feels some better.  Saw ENT yesterday.  S/p cerumen debridement. Planning for VNG.  Describes feeling light headed.  No headache.  No increased sinus congestion.  No chest pain or sob reported.  No abdominal pain.  No nausea, vomiting or diarrhea.  Eating.     Past Medical History:  Diagnosis Date  . Arthritis   . Chicken pox   . Endometriosis   . Esophagitis    s/p esophageal dilatation  . GERD (gastroesophageal reflux disease)   . History of ovarian cyst   . Hypercholesterolemia   . Hypertension   . Hypothyroidism    Past Surgical History:  Procedure Laterality Date  . ABDOMINAL SURGERY     found to have an ovarian cyst and endometriosis  . APPENDECTOMY     Family History  Problem Relation Age of Onset  . Lung disease Father   . Heart attack Father   . Heart disease Mother        myocardial infarction  . Breast cancer Mother 40  . Heart attack Mother   . Hypertension Mother   . Hyperlipidemia Mother   . Breast cancer Sister 20  . Hypertension Sister   . Rheum arthritis Sister   . Breast cancer Maternal Aunt 36  . Hypertension Brother   . Colon cancer Neg Hx    Social History    Socioeconomic History  . Marital status: Married    Spouse name: Not on file  . Number of children: Not on file  . Years of education: Not on file  . Highest education level: Not on file  Occupational History  . Not on file  Tobacco Use  . Smoking status: Never Smoker  . Smokeless tobacco: Never Used  Vaping Use  . Vaping Use: Never used  Substance and Sexual Activity  . Alcohol use: No    Alcohol/week: 0.0 standard drinks  . Drug use: No  . Sexual activity: Never  Other Topics Concern  . Not on file  Social History Narrative  . Not on file   Social Determinants of Health   Financial Resource Strain: Not on file  Food Insecurity: Not on file  Transportation Needs: Not on file  Physical Activity: Not on file  Stress: Not on file  Social Connections: Not on file    Outpatient Encounter Medications as of 03/02/2021  Medication Sig  . Biotin 5 MG CAPS Take 5,000 mcg by mouth daily.  . carvedilol (COREG) 6.25 MG tablet TAKE 1 TABLET TWICE A DAY (Patient taking differently: Take 6.25 mg by mouth 2 (two) times daily.)  . cholecalciferol (VITAMIN D) 25 MCG (1000 UNIT) tablet Take 1,000 Units by mouth daily.  Marland Kitchen  dorzolamide-timolol (COSOPT) 22.3-6.8 MG/ML ophthalmic solution Place 1 drop into both eyes 2 (two) times daily.  Marland Kitchen levothyroxine (SYNTHROID) 75 MCG tablet Take 1 tablet (75 mcg total) by mouth daily.  Marland Kitchen lisinopril (ZESTRIL) 40 MG tablet Take 1 tablet (40 mg total) by mouth daily.  . Multiple Vitamins-Minerals (ZINC PO) Take 1 tablet by mouth daily.  . simvastatin (ZOCOR) 10 MG tablet TAKE 1 TABLET AT BEDTIME (Patient taking differently: Take 10 mg by mouth at bedtime.)  . spironolactone (ALDACTONE) 25 MG tablet TAKE 1 TABLET DAILY (Patient taking differently: Take 25 mg by mouth daily.)  . [DISCONTINUED] omeprazole (PRILOSEC) 20 MG capsule Take 1 capsule (20 mg total) by mouth daily.  . [DISCONTINUED] cefdinir (OMNICEF) 300 MG capsule Take 1 capsule (300 mg total) by  mouth 2 (two) times daily. (Patient not taking: Reported on 03/02/2021)  . [DISCONTINUED] predniSONE (STERAPRED UNI-PAK 21 TAB) 10 MG (21) TBPK tablet PO: Take 6 tablets on day 1:Take 5 tablets day 2:Take 4 tablets day 3: Take 3 tablets day 4:Take 2 tablets day five: 5 Take 1 tablet day 6 (Patient not taking: Reported on 03/02/2021)   No facility-administered encounter medications on file as of 03/02/2021.    Review of Systems  Constitutional: Negative for appetite change and unexpected weight change.  HENT: Negative for congestion and sinus pressure.   Respiratory: Negative for cough, chest tightness and shortness of breath.   Cardiovascular: Negative for chest pain, palpitations and leg swelling.  Gastrointestinal: Negative for abdominal pain, diarrhea, nausea and vomiting.  Genitourinary: Negative for difficulty urinating and dysuria.  Musculoskeletal: Negative for joint swelling and myalgias.  Skin: Negative for color change and rash.  Neurological: Positive for dizziness and light-headedness. Negative for headaches.  Psychiatric/Behavioral: Negative for agitation and dysphoric mood.       Objective:    Physical Exam Vitals reviewed.  Constitutional:      General: She is not in acute distress.    Appearance: Normal appearance.  HENT:     Head: Normocephalic and atraumatic.     Right Ear: External ear normal.     Left Ear: External ear normal.  Eyes:     General: No scleral icterus.       Right eye: No discharge.        Left eye: No discharge.     Conjunctiva/sclera: Conjunctivae normal.  Neck:     Thyroid: No thyromegaly.  Cardiovascular:     Rate and Rhythm: Normal rate and regular rhythm.  Pulmonary:     Effort: No respiratory distress.     Breath sounds: Normal breath sounds. No wheezing.  Abdominal:     General: Bowel sounds are normal.     Palpations: Abdomen is soft.     Tenderness: There is no abdominal tenderness.  Musculoskeletal:        General: No swelling  or tenderness.     Cervical back: Neck supple. No tenderness.  Lymphadenopathy:     Cervical: No cervical adenopathy.  Skin:    Findings: No erythema or rash.  Neurological:     Mental Status: She is alert.  Psychiatric:        Mood and Affect: Mood normal.        Behavior: Behavior normal.     BP 110/62 (BP Location: Left Arm, Patient Position: Sitting, Cuff Size: Normal)   Pulse (!) 56   Temp (!) 95.4 F (35.2 C) (Temporal)   Ht 5\' 2"  (1.575 m)   Wt 132 lb  12.8 oz (60.2 kg)   LMP 10/21/1984   SpO2 98%   BMI 24.29 kg/m  Wt Readings from Last 3 Encounters:  03/02/21 132 lb 12.8 oz (60.2 kg)  02/23/21 128 lb (58.1 kg)  02/22/21 130 lb 6.4 oz (59.1 kg)     Lab Results  Component Value Date   WBC 7.9 03/02/2021   HGB 14.4 03/02/2021   HCT 42.4 03/02/2021   PLT 297.0 03/02/2021   GLUCOSE 96 03/02/2021   CHOL 153 12/08/2020   TRIG 181.0 (H) 12/08/2020   HDL 43.50 12/08/2020   LDLDIRECT 94.0 01/04/2017   LDLCALC 74 12/08/2020   ALT 22 02/23/2021   AST 22 02/23/2021   NA 131 (L) 03/08/2021   K 4.3 03/02/2021   CL 96 03/02/2021   CREATININE 0.77 03/02/2021   BUN 16 03/02/2021   CO2 28 03/02/2021   TSH 0.71 03/02/2021   INR 1.0 08/12/2015    DG Chest Portable 1 View  Result Date: 02/23/2021 CLINICAL DATA:  Dizziness EXAM: PORTABLE CHEST 1 VIEW COMPARISON:  03/28/2016 FINDINGS: No focal consolidation or effusion. Mild bronchitic changes. Normal cardiomediastinal silhouette. Aortic atherosclerosis. No pneumothorax. Mild atelectasis or scarring left base. IMPRESSION: No active disease. Mild bronchitic changes and scarring or atelectasis left base Electronically Signed   By: Donavan Foil M.D.   On: 02/23/2021 21:25       Assessment & Plan:   Problem List Items Addressed This Visit    Dizziness    Dizziness.  Given IVFs.  Discussed decreasing free water intake.  Recheck sodium today.  Seeing ENT.  Planning for VNG. Slow position changes and movements.  Follow.        Fatty liver    Found on ultrasound.  Follow liver function tests.        Hypercholesterolemia    Continue simvastatin.  Follow lipid panel and liver function tests.        Hypertension    Blood pressure as outlined.  Continues on lisinopril, coreg and aldactone.  Feeling some better.  Check metabolic panel today.        Relevant Orders   CBC with Differential/Platelet (Completed)   Hyponatremia - Primary    Found recently - during recent admission.  Had been drinking an increased amount of water.  Given IVFs.  Discussed decreasing amount of free water. Continue current medication regimen.  Recheck electrolytes today.  Further w/up pending results.        Relevant Orders   Basic metabolic panel (Completed)   Hypothyroidism    On thyroid replacement.  Follow tsh.       Relevant Orders   TSH (Completed)       Einar Pheasant, MD

## 2021-03-02 NOTE — Telephone Encounter (Signed)
Note received

## 2021-03-02 NOTE — Telephone Encounter (Signed)
Office note requested.

## 2021-03-03 ENCOUNTER — Other Ambulatory Visit: Payer: Self-pay | Admitting: Internal Medicine

## 2021-03-03 DIAGNOSIS — E871 Hypo-osmolality and hyponatremia: Secondary | ICD-10-CM

## 2021-03-03 NOTE — Progress Notes (Signed)
Order placed for f/u sodium.  ?

## 2021-03-07 ENCOUNTER — Telehealth: Payer: Self-pay | Admitting: Internal Medicine

## 2021-03-07 MED ORDER — OMEPRAZOLE 20 MG PO CPDR: 20.0000 mg | DELAYED_RELEASE_CAPSULE | Freq: Every day | ORAL | 1 refills | Status: DC

## 2021-03-07 NOTE — Addendum Note (Signed)
Addended by: Elpidio Galea T on: 03/07/2021 10:40 AM   Modules accepted: Orders

## 2021-03-07 NOTE — Telephone Encounter (Signed)
Pt needs a refill on omeprazole (PRILOSEC) 20 MG capsule sent to [facility]

## 2021-03-08 ENCOUNTER — Ambulatory Visit: Payer: Medicare Other

## 2021-03-08 ENCOUNTER — Other Ambulatory Visit: Payer: Self-pay

## 2021-03-08 ENCOUNTER — Other Ambulatory Visit (INDEPENDENT_AMBULATORY_CARE_PROVIDER_SITE_OTHER): Payer: Medicare Other

## 2021-03-08 DIAGNOSIS — E871 Hypo-osmolality and hyponatremia: Secondary | ICD-10-CM | POA: Diagnosis not present

## 2021-03-09 ENCOUNTER — Ambulatory Visit: Payer: Medicare Other

## 2021-03-09 LAB — SODIUM: Sodium: 131 mEq/L — ABNORMAL LOW (ref 135–145)

## 2021-03-10 ENCOUNTER — Encounter: Payer: Self-pay | Admitting: Internal Medicine

## 2021-03-11 ENCOUNTER — Telehealth: Payer: Self-pay | Admitting: Internal Medicine

## 2021-03-11 NOTE — Telephone Encounter (Signed)
See result note.  

## 2021-03-11 NOTE — Telephone Encounter (Signed)
Patient was returning call about labs 

## 2021-03-12 ENCOUNTER — Encounter: Payer: Self-pay | Admitting: Internal Medicine

## 2021-03-12 NOTE — Assessment & Plan Note (Signed)
Continue simvastatin.  Follow lipid panel and liver function tests.

## 2021-03-12 NOTE — Assessment & Plan Note (Signed)
Blood pressure as outlined.  Continues on lisinopril, coreg and aldactone.  Feeling some better.  Check metabolic panel today.

## 2021-03-12 NOTE — Assessment & Plan Note (Signed)
Found on ultrasound.  Follow liver function tests.   

## 2021-03-12 NOTE — Assessment & Plan Note (Signed)
Dizziness.  Given IVFs.  Discussed decreasing free water intake.  Recheck sodium today.  Seeing ENT.  Planning for VNG. Slow position changes and movements.  Follow.

## 2021-03-12 NOTE — Assessment & Plan Note (Signed)
On thyroid replacement.  Follow tsh.  

## 2021-03-12 NOTE — Assessment & Plan Note (Signed)
Found recently - during recent admission.  Had been drinking an increased amount of water.  Given IVFs.  Discussed decreasing amount of free water. Continue current medication regimen.  Recheck electrolytes today.  Further w/up pending results.

## 2021-03-14 ENCOUNTER — Ambulatory Visit
Admission: RE | Admit: 2021-03-14 | Discharge: 2021-03-14 | Disposition: A | Discharge status: Home / Self Care | Payer: Medicare Other | Source: Ambulatory Visit | Attending: Internal Medicine | Admitting: Internal Medicine

## 2021-03-14 ENCOUNTER — Other Ambulatory Visit: Payer: Self-pay

## 2021-03-14 DIAGNOSIS — Z1231 Encounter for screening mammogram for malignant neoplasm of breast: Secondary | ICD-10-CM | POA: Insufficient documentation

## 2021-03-15 ENCOUNTER — Ambulatory Visit (INDEPENDENT_AMBULATORY_CARE_PROVIDER_SITE_OTHER): Payer: Medicare Other

## 2021-03-15 VITALS — BP 132/76 | HR 73 | Temp 96.9°F | Resp 14 | Ht 62.0 in | Wt 129.4 lb

## 2021-03-15 DIAGNOSIS — Z0000 Encounter for general adult medical examination without abnormal findings: Secondary | ICD-10-CM | POA: Diagnosis not present

## 2021-03-15 NOTE — Progress Notes (Signed)
Subjective:   Crystal Lynch is a 80 y.o. female who presents for Medicare Annual (Subsequent) preventive examination.  Review of Systems    No ROS.  Medicare Wellness  Cardiac Risk Factors include: advanced age (>30men, >54 women);hypertension     Objective:    Today's Vitals   03/15/21 0953  BP: 132/76  Pulse: 73  Resp: 14  Temp: (!) 96.9 F (36.1 C)  SpO2: 100%  Weight: 129 lb 6.4 oz (58.7 kg)  Height: 5\' 2"  (1.575 m)   Body mass index is 23.67 kg/m.  Advanced Directives 03/15/2021 02/24/2021 02/23/2021 03/05/2020 03/03/2019 03/01/2018 09/04/2017  Does Patient Have a Medical Advance Directive? Yes Yes Yes Yes Yes Yes Yes  Type of Paramedic of [place];Living will [place];Living will [place];Living will [place];Living will - [place];Living will [place];Living will  Does patient want to make changes to medical advance directive? No - Patient declined No - Patient declined - No - Patient declined No - Patient declined No - Patient declined No - Patient declined  Copy of [place] in Chart? No - copy requested No - copy requested - No - copy requested - No - copy requested -    Current Medications (verified) Outpatient Encounter Medications as of 03/15/2021  Medication Sig  . Biotin 5 MG CAPS Take 5,000 mcg by mouth daily.  . carvedilol (COREG) 6.25 MG tablet TAKE 1 TABLET TWICE A DAY (Patient taking differently: Take 6.25 mg by mouth 2 (two) times daily.)  . cholecalciferol (VITAMIN D) 25 MCG (1000 UNIT) tablet Take 1,000 Units by mouth daily.  . dorzolamide-timolol (COSOPT) 22.3-6.8 MG/ML ophthalmic solution Place 1 drop into both eyes 2 (two) times daily.  Marland Kitchen levothyroxine (SYNTHROID) 75 MCG tablet Take 1 tablet (75 mcg total) by mouth daily.  Marland Kitchen lisinopril (ZESTRIL) 40 MG tablet Take 1 tablet (40 mg total) by mouth daily.  .  Multiple Vitamins-Minerals (ZINC PO) Take 1 tablet by mouth daily.  Marland Kitchen omeprazole (PRILOSEC) 20 MG capsule Take 1 capsule (20 mg total) by mouth daily.  . simvastatin (ZOCOR) 10 MG tablet TAKE 1 TABLET AT BEDTIME (Patient taking differently: Take 10 mg by mouth at bedtime.)  . spironolactone (ALDACTONE) 25 MG tablet TAKE 1 TABLET DAILY (Patient taking differently: Take 25 mg by mouth daily.)   No facility-administered encounter medications on file as of 03/15/2021.    Allergies (verified) Meloxicam   History: Past Medical History:  Diagnosis Date  . Arthritis   . Chicken pox   . Endometriosis   . Esophagitis    s/p esophageal dilatation  . GERD (gastroesophageal reflux disease)   . History of ovarian cyst   . Hypercholesterolemia   . Hypertension   . Hypothyroidism    Past Surgical History:  Procedure Laterality Date  . ABDOMINAL SURGERY     found to have an ovarian cyst and endometriosis  . APPENDECTOMY     Family History  Problem Relation Age of Onset  . Lung disease Father   . Heart attack Father   . Heart disease Mother        myocardial infarction  . Breast cancer Mother 44  . Heart attack Mother   . Hypertension Mother   . Hyperlipidemia Mother   . Breast cancer Sister 88  . Hypertension Sister   . Rheum arthritis Sister   . Breast cancer Maternal Aunt 73  . Hypertension  Brother   . Colon cancer Neg Hx    Social History   Socioeconomic History  . Marital status: Married    Spouse name: Not on file  . Number of children: Not on file  . Years of education: Not on file  . Highest education level: Not on file  Occupational History  . Not on file  Tobacco Use  . Smoking status: Never Smoker  . Smokeless tobacco: Never Used  Vaping Use  . Vaping Use: Never used  Substance and Sexual Activity  . Alcohol use: No    Alcohol/week: 0.0 standard drinks  . Drug use: No  . Sexual activity: Never  Other Topics Concern  . Not on file  Social History Narrative   . Not on file   Social Determinants of Health   Financial Resource Strain: Low Risk   . Difficulty of Paying Living Expenses: Not hard at all  Food Insecurity: No Food Insecurity  . Worried About Charity fundraiser in the Last Year: Never true  . Ran Out of Food in the Last Year: Never true  Transportation Needs: No Transportation Needs  . Lack of Transportation (Medical): No  . Lack of Transportation (Non-Medical): No  Physical Activity: Sufficiently Active  . Days of Exercise per Week: 5 days  . Minutes of Exercise per Session: 30 min  Stress: No Stress Concern Present  . Feeling of Stress : Not at all  Social Connections: Unknown  . Frequency of Communication with Friends and Family: More than three times a week  . Frequency of Social Gatherings with Friends and Family: More than three times a week  . Attends Religious Services: More than 4 times per year  . Active Member of Clubs or Organizations: Not on file  . Attends Archivist Meetings: Not on file  . Marital Status: Married    Tobacco Counseling Counseling given: Not Answered   Clinical Intake:  Pre-visit preparation completed: Yes        Diabetes: No  How often do you need to have someone help you when you read instructions, pamphlets, or other written materials from your doctor or pharmacy?: 1 - Never    Interpreter Needed?: No      Activities of Daily Living In your present state of health, do you have any difficulty performing the following activities: 03/15/2021 02/24/2021  Hearing? N N  Vision? N N  Difficulty concentrating or making decisions? N N  Walking or climbing stairs? Y N  Comment Paces self. -  Dressing or bathing? N N  Doing errands, shopping? N N  Preparing Food and eating ? N -  Using the Toilet? N -  In the past six months, have you accidently leaked urine? N -  Do you have problems with loss of bowel control? N -  Managing your Medications? N -  Managing your  Finances? Y -  Comment Husband assist -  Housekeeping or managing your Housekeeping? Y -  Comment Maid assist as needed -  Some recent data might be hidden    Patient Care Team: Einar Pheasant, MD as PCP - General (Internal Medicine)  Indicate any recent Medical Services you may have received from other than [facility] providers in the past year (date may be approximate).     Assessment:   This is a routine wellness examination for [place].  Hearing/Vision screen  Hearing Screening   125Hz  250Hz  500Hz  1000Hz  2000Hz  3000Hz  4000Hz  6000Hz  8000Hz   Right ear:  Left ear:           Comments: Patient is able to hear conversational tones without difficulty. No issues reported.   Vision Screening Comments: Followed by [facility]  Wears corrective lenses  Visual acuity not assessed per patient preference since they have regular follow up with the ophthalmologist  Dietary issues and exercise activities discussed: Current Exercise Habits: Home exercise routine, Intensity: MildHealthy diet Monitors water intake to keep sodium levels within range  Goals Addressed              This Visit's Progress     Patient Stated   .  I want to start doing balance exercises (pt-stated)        Core/strengthening exercises Walk more for exercise      Depression Screen PHQ 2/9 Scores 03/15/2021 02/22/2021 03/05/2020 03/03/2020 03/03/2019 04/19/2018 03/01/2018  PHQ - 2 Score 0 0 0 0 0 0 0  PHQ- 9 Score - - - - - - -    Fall Risk Fall Risk  02/22/2021 03/05/2020 07/25/2019 03/03/2019 04/19/2018  Falls in the past year? 1 1 0 0 No  Number falls in past yr: 0 0 - - -  Injury with Fall? 1 1 - - -  Comment - She lost her balance while stepping and sought medical attention with Urgent Care about a week ago. - - -  Follow up Falls evaluation completed Falls evaluation completed Falls evaluation completed - -    FALL RISK PREVENTION PERTAINING TO THE HOME: Handrails in use when climbing stairs?  Yes Home free of loose throw rugs in walkways, pet beds, electrical cords, etc? Yes  Adequate lighting in your home to reduce risk of falls? Yes   ASSISTIVE DEVICES UTILIZED TO PREVENT FALLS: Life alert? No  Use of a cane, walker or w/c? Yes   Grab bars in the bathroom? Yes  Shower chair or bench in shower? Yes  Elevated toilet seat or a handicapped toilet? Yes   TIMED UP AND GO: Was the test performed? Yes .  Length of time to ambulate 10 feet: 10 sec.   Gait steady and fast with assistive device   Cognitive Function: Patient is alert and oriented x3.  Denies difficulty focusing, making decisions, memory loss.  Enjoys brain health activities. MMSE/6CIT deferred. Normal by direct communication/observation.   MMSE - Mini Mental State Exam 03/01/2018 02/28/2017  Orientation to time 5 5  Orientation to Place 5 5  Registration 3 3  Attention/ Calculation 5 5  Recall 3 3  Language- name 2 objects 2 2  Language- repeat 1 1  Language- follow 3 step command 3 3  Language- read & follow direction 1 1  Write a sentence 1 1  Copy design 1 1  Total score 30 30     6CIT Screen 03/15/2021 03/05/2020 03/03/2019  What Year? 0 points 0 points 0 points  What month? 0 points 0 points 0 points  What time? 0 points 0 points 0 points  Count back from 20 0 points - 0 points  Months in reverse 0 points 0 points 0 points  Repeat phrase - 0 points 0 points  Total Score - - 0    Immunizations Immunization History  Administered Date(s) Administered  . Influenza, High Dose Seasonal PF 07/10/2017, 07/01/2018  . Influenza-Unspecified 06/23/2013, 07/12/2015, 07/04/2016, 06/25/2019, 07/06/2020  . PFIZER(Purple Top)SARS-COV-2 Vaccination 10/27/2019, 11/17/2019, 08/24/2020  . Pneumococcal Conjugate-13 08/04/2014  . Pneumococcal Polysaccharide-23 02/28/2017  . Tdap 03/03/2020  .  Zoster Recombinat (Shingrix) 02/04/2021   Health Maintenance Health Maintenance  Topic Date Due  . INFLUENZA VACCINE   05/23/2021  . MAMMOGRAM  03/14/2022  . TETANUS/TDAP  03/03/2030  . DEXA SCAN  Completed  . COVID-19 Vaccine  Completed  . PNA vac Low Risk Adult  Completed  . HPV VACCINES  Aged Out    Lung Cancer Screening: (Low Dose CT Chest recommended if Age 20-80 years, 30 pack-year currently smoking OR have quit w/in 15years.) does not qualify.   Vision Screening: Recommended annual ophthalmology exams for early detection of glaucoma and other disorders of the eye. Is the patient up to date with their annual eye exam?  Yes  Who is the provider or what is the name of the office in which the patient attends annual eye exams? [place] Screening: Recommended annual dental exams for proper oral hygiene  Community Resource Referral / Chronic Care Management: CRR required this visit?  No   CCM required this visit?  No      Plan:    Keep all routine maintenance appointments.   Call to schedule Bone Density with [facility].   I have personally reviewed and noted the following in the patient's chart:   . Medical and social history . Use of alcohol, tobacco or illicit drugs  . Current medications and supplements including opioid prescriptions. Patient is not currently taking opioids.  . Functional ability and status . Nutritional status . Physical activity . Advanced directives . List of other physicians . Hospitalizations, surgeries, and ER visits in previous 12 months . Vitals . Screenings to include cognitive, depression, and falls . Referrals and appointments  In addition, I have reviewed and discussed with patient certain preventive protocols, quality metrics, and best practice recommendations. A written personalized care plan for preventive services as well as general preventive health recommendations were provided to patient.     Varney Biles, LPN   5/36/1443

## 2021-03-15 NOTE — Patient Instructions (Addendum)
Crystal Lynch , Thank you for taking time to come for your Medicare Wellness Visit. I appreciate your ongoing commitment to your health goals. Please review the following plan we discussed and let me know if I can assist you in the future.   These are the goals we discussed: Goals      Patient Stated   .  I want to start doing balance exercises (pt-stated)      Core/strengthening exercises Walk more for exercise       This is a list of the screening recommended for you and due dates:  Health Maintenance  Topic Date Due  . Flu Shot  05/23/2021  . Mammogram  03/14/2022  . Tetanus Vaccine  03/03/2030  . DEXA scan (bone density measurement)  Completed  . COVID-19 Vaccine  Completed  . Pneumonia vaccines  Completed  . HPV Vaccine  Aged Out    Immunizations Immunization History  Administered Date(s) Administered  . Influenza, High Dose Seasonal PF 07/10/2017, 07/01/2018  . Influenza-Unspecified 06/23/2013, 07/12/2015, 07/04/2016, 06/25/2019, 07/06/2020  . PFIZER(Purple Top)SARS-COV-2 Vaccination 10/27/2019, 11/17/2019, 08/24/2020  . Pneumococcal Conjugate-13 08/04/2014  . Pneumococcal Polysaccharide-23 02/28/2017  . Tdap 03/03/2020  . Zoster Recombinat (Shingrix) 02/04/2021   Advanced directives: End of life planning; Advance aging; Advanced directives discussed.  Copy of current HCPOA/Living Will requested.    Conditions/risks identified: none new  Follow up in one year for your annual wellness visit    Preventive Care 65 Years and Older, Female Preventive care refers to lifestyle choices and visits with your health care provider that can promote health and wellness. What does preventive care include?  A yearly physical exam. This is also called an annual well check.  Dental exams once or twice a year.  Routine eye exams. Ask your health care provider how often you should have your eyes checked.  Personal lifestyle choices, including:  Daily care of your teeth and  gums.  Regular physical activity.  Eating a healthy diet.  Avoiding tobacco and drug use.  Limiting alcohol use.  Practicing safe sex.  Taking low-dose aspirin every day.  Taking vitamin and mineral supplements as recommended by your health care provider. What happens during an annual well check? The services and screenings done by your health care provider during your annual well check will depend on your age, overall health, lifestyle risk factors, and family history of disease. Counseling  Your health care provider may ask you questions about your:  Alcohol use.  Tobacco use.  Drug use.  Emotional well-being.  Home and relationship well-being.  Sexual activity.  Eating habits.  History of falls.  Memory and ability to understand (cognition).  Work and work Statistician.  Reproductive health. Screening  You may have the following tests or measurements:  Height, weight, and BMI.  Blood pressure.  Lipid and cholesterol levels. These may be checked every 5 years, or more frequently if you are over 81 years old.  Skin check.  Lung cancer screening. You may have this screening every year starting at age 81 if you have a 30-pack-year history of smoking and currently smoke or have quit within the past 15 years.  Fecal occult blood test (FOBT) of the stool. You may have this test every year starting at age 81.  Flexible sigmoidoscopy or colonoscopy. You may have a sigmoidoscopy every 5 years or a colonoscopy every 10 years starting at age 81.  Hepatitis C blood test.  Hepatitis B blood test.  Sexually transmitted disease (STD)  testing.  Diabetes screening. This is done by checking your blood sugar (glucose) after you have not eaten for a while (fasting). You may have this done every 1-3 years.  Bone density scan. This is done to screen for osteoporosis. You may have this done starting at age 81.  Mammogram. This may be done every 1-2 years. Talk to your  health care provider about how often you should have regular mammograms. Talk with your health care provider about your test results, treatment options, and if necessary, the need for more tests. Vaccines  Your health care provider may recommend certain vaccines, such as:  Influenza vaccine. This is recommended every year.  Tetanus, diphtheria, and acellular pertussis (Tdap, Td) vaccine. You may need a Td booster every 10 years.  Zoster vaccine. You may need this after age 81.  Pneumococcal 13-valent conjugate (PCV13) vaccine. One dose is recommended after age 81.  Pneumococcal polysaccharide (PPSV23) vaccine. One dose is recommended after age 81. Talk to your health care provider about which screenings and vaccines you need and how often you need them. This information is not intended to replace advice given to you by your health care provider. Make sure you discuss any questions you have with your health care provider. Document Released: 11/05/2015 Document Revised: 06/28/2016 Document Reviewed: 08/10/2015 Elsevier Interactive Patient Education  2017 [place] Prevention in the Home Falls can cause injuries. They can happen to people of all ages. There are many things you can do to make your home safe and to help prevent falls. What can I do on the outside of my home?  Regularly fix the edges of walkways and driveways and fix any cracks.  Remove anything that might make you trip as you walk through a door, such as a raised step or threshold.  Trim any bushes or trees on the path to your home.  Use bright outdoor lighting.  Clear any walking paths of anything that might make someone trip, such as rocks or tools.  Regularly check to see if handrails are loose or broken. Make sure that both sides of any steps have handrails.  Any raised decks and porches should have guardrails on the edges.  Have any leaves, snow, or ice cleared regularly.  Use sand or salt on walking  paths during winter.  Clean up any spills in your garage right away. This includes oil or grease spills. What can I do in the bathroom?  Use night lights.  Install grab bars by the toilet and in the tub and shower. Do not use towel bars as grab bars.  Use non-skid mats or decals in the tub or shower.  If you need to sit down in the shower, use a plastic, non-slip stool.  Keep the floor dry. Clean up any water that spills on the floor as soon as it happens.  Remove soap buildup in the tub or shower regularly.  Attach bath mats securely with double-sided non-slip rug tape.  Do not have throw rugs and other things on the floor that can make you trip. What can I do in the bedroom?  Use night lights.  Make sure that you have a light by your bed that is easy to reach.  Do not use any sheets or blankets that are too big for your bed. They should not hang down onto the floor.  Have a firm chair that has side arms. You can use this for support while you get dressed.  Do not  have throw rugs and other things on the floor that can make you trip. What can I do in the kitchen?  Clean up any spills right away.  Avoid walking on wet floors.  Keep items that you use a lot in easy-to-reach places.  If you need to reach something above you, use a strong step stool that has a grab bar.  Keep electrical cords out of the way.  Do not use floor polish or wax that makes floors slippery. If you must use wax, use non-skid floor wax.  Do not have throw rugs and other things on the floor that can make you trip. What can I do with my stairs?  Do not leave any items on the stairs.  Make sure that there are handrails on both sides of the stairs and use them. Fix handrails that are broken or loose. Make sure that handrails are as long as the stairways.  Check any carpeting to make sure that it is firmly attached to the stairs. Fix any carpet that is loose or worn.  Avoid having throw rugs at  the top or bottom of the stairs. If you do have throw rugs, attach them to the floor with carpet tape.  Make sure that you have a light switch at the top of the stairs and the bottom of the stairs. If you do not have them, ask someone to add them for you. What else can I do to help prevent falls?  Wear shoes that:  Do not have high heels.  Have rubber bottoms.  Are comfortable and fit you well.  Are closed at the toe. Do not wear sandals.  If you use a stepladder:  Make sure that it is fully opened. Do not climb a closed stepladder.  Make sure that both sides of the stepladder are locked into place.  Ask someone to hold it for you, if possible.  Clearly mark and make sure that you can see:  Any grab bars or handrails.  First and last steps.  Where the edge of each step is.  Use tools that help you move around (mobility aids) if they are needed. These include:  Canes.  Walkers.  Scooters.  Crutches.  Turn on the lights when you go into a dark area. Replace any light bulbs as soon as they burn out.  Set up your furniture so you have a clear path. Avoid moving your furniture around.  If any of your floors are uneven, fix them.  If there are any pets around you, be aware of where they are.  Review your medicines with your doctor. Some medicines can make you feel dizzy. This can increase your chance of falling. Ask your doctor what other things that you can do to help prevent falls. This information is not intended to replace advice given to you by your health care provider. Make sure you discuss any questions you have with your health care provider. Document Released: 08/05/2009 Document Revised: 03/16/2016 Document Reviewed: 11/13/2014 Elsevier Interactive Patient Education  2017 [employer].

## 2021-03-22 ENCOUNTER — Other Ambulatory Visit (INDEPENDENT_AMBULATORY_CARE_PROVIDER_SITE_OTHER): Payer: Medicare Other

## 2021-03-22 ENCOUNTER — Other Ambulatory Visit: Payer: Self-pay

## 2021-03-22 DIAGNOSIS — I10 Essential (primary) hypertension: Secondary | ICD-10-CM | POA: Diagnosis not present

## 2021-03-22 DIAGNOSIS — E7800 Pure hypercholesterolemia, unspecified: Secondary | ICD-10-CM

## 2021-03-23 LAB — HEPATIC FUNCTION PANEL
ALT: 13 U/L (ref 0–35)
AST: 13 U/L (ref 0–37)
Albumin: 4.3 g/dL (ref 3.5–5.2)
Alkaline Phosphatase: 69 U/L (ref 39–117)
Bilirubin, Direct: 0.2 mg/dL (ref 0.0–0.3)
Total Bilirubin: 1 mg/dL (ref 0.2–1.2)
Total Protein: 6 g/dL (ref 6.0–8.3)

## 2021-03-23 LAB — BASIC METABOLIC PANEL
BUN: 15 mg/dL (ref 6–23)
CO2: 25 mEq/L (ref 19–32)
Calcium: 10.1 mg/dL (ref 8.4–10.5)
Chloride: 100 mEq/L (ref 96–112)
Creatinine, Ser: 0.78 mg/dL (ref 0.40–1.20)
GFR: 71.3 mL/min (ref >60.00)
Glucose, Bld: 101 mg/dL — ABNORMAL HIGH (ref 70–99)
Potassium: 4 mEq/L (ref 3.5–5.1)
Sodium: 133 mEq/L — ABNORMAL LOW (ref 135–145)

## 2021-03-23 LAB — LIPID PANEL
Cholesterol: 156 mg/dL (ref 0–200)
HDL: 42.5 mg/dL (ref >39.00)
NonHDL: 113.64
Total CHOL/HDL Ratio: 4
Triglycerides: 269 mg/dL — ABNORMAL HIGH (ref 0.0–149.0)
VLDL: 53.8 mg/dL — ABNORMAL HIGH (ref 0.0–40.0)

## 2021-03-23 LAB — LDL CHOLESTEROL, DIRECT: Direct LDL: 88 mg/dL

## 2021-03-24 ENCOUNTER — Other Ambulatory Visit: Payer: Self-pay

## 2021-03-24 ENCOUNTER — Ambulatory Visit
Admission: RE | Admit: 2021-03-24 | Discharge: 2021-03-24 | Disposition: A | Discharge status: Home / Self Care | Payer: Medicare Other | Source: Ambulatory Visit | Attending: Internal Medicine | Admitting: Internal Medicine

## 2021-03-24 DIAGNOSIS — E2839 Other primary ovarian failure: Secondary | ICD-10-CM | POA: Diagnosis not present

## 2021-04-06 ENCOUNTER — Telehealth: Payer: Self-pay | Admitting: *Deleted

## 2021-04-06 NOTE — Telephone Encounter (Signed)
Please place future orders for lab appt.  

## 2021-04-06 NOTE — Telephone Encounter (Signed)
Patient has been notified and appointment canceled.

## 2021-04-06 NOTE — Telephone Encounter (Signed)
She does not need to come in for lab tomorrow.  It appears she had her fasting labs drawn 03/22/21.  Too early.

## 2021-04-07 ENCOUNTER — Other Ambulatory Visit: Payer: Medicare Other

## 2021-04-11 ENCOUNTER — Ambulatory Visit (INDEPENDENT_AMBULATORY_CARE_PROVIDER_SITE_OTHER): Payer: Medicare Other | Admitting: Internal Medicine

## 2021-04-11 ENCOUNTER — Other Ambulatory Visit: Payer: Self-pay

## 2021-04-11 VITALS — BP 122/72 | HR 67 | Temp 97.6°F | Resp 16 | Ht 62.0 in | Wt 128.0 lb

## 2021-04-11 DIAGNOSIS — K760 Fatty (change of) liver, not elsewhere classified: Secondary | ICD-10-CM | POA: Diagnosis not present

## 2021-04-11 DIAGNOSIS — I10 Essential (primary) hypertension: Secondary | ICD-10-CM | POA: Diagnosis not present

## 2021-04-11 DIAGNOSIS — E871 Hypo-osmolality and hyponatremia: Secondary | ICD-10-CM

## 2021-04-11 DIAGNOSIS — I700 Atherosclerosis of aorta: Secondary | ICD-10-CM

## 2021-04-11 DIAGNOSIS — E039 Hypothyroidism, unspecified: Secondary | ICD-10-CM

## 2021-04-11 DIAGNOSIS — E7800 Pure hypercholesterolemia, unspecified: Secondary | ICD-10-CM

## 2021-04-11 DIAGNOSIS — R42 Dizziness and giddiness: Secondary | ICD-10-CM

## 2021-04-11 LAB — BASIC METABOLIC PANEL
BUN: 11 mg/dL (ref 6–23)
CO2: 24 mEq/L (ref 19–32)
Calcium: 9.9 mg/dL (ref 8.4–10.5)
Chloride: 97 mEq/L (ref 96–112)
Creatinine, Ser: 0.71 mg/dL (ref 0.40–1.20)
GFR: 79.79 mL/min (ref >60.00)
Glucose, Bld: 97 mg/dL (ref 70–99)
Potassium: 4.4 mEq/L (ref 3.5–5.1)
Sodium: 129 mEq/L — ABNORMAL LOW (ref 135–145)

## 2021-04-11 NOTE — Progress Notes (Signed)
Patient ID: Crystal Lynch, female   DOB: 1941-02-08, 80 y.o.   MRN: [id number]   Subjective:    Patient ID: Crystal Lynch, female    DOB: [birth year], 80 y.o.   MRN: [id number]  HPI This visit occurred during the SARS-CoV-2 public health emergency.  Safety protocols were in place, including screening questions prior to the visit, additional usage of staff PPE, and extensive cleaning of exam room while observing appropriate contact time as indicated for disinfecting solutions.   Patient here for a scheduled follow up.  Here to follow up regarding her blood pressure, dizziness and low sodium.  Evaluated by ENT.  Persistent dizziness.  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  Recommended vestibular rehab.  She has been drinking more Gatorade.  Sodium has fluctuated.  Discussed limiting free fluid intake.  No headache.  No chest pain reported.  Breathing stable.  No increased cough or congestion.  Does not appear to be volume overload.  No increased abdominal pain reported.  No bowel issues reported.  No sick contacts.  No fever.  No nausea or vomiting.  Has been using a cane to help with balance.  No falls.   Past Medical History:  Diagnosis Date   Arthritis    Chicken pox    Endometriosis    Esophagitis    s/p esophageal dilatation   GERD (gastroesophageal reflux disease)    History of ovarian cyst    Hypercholesterolemia    Hypertension    Hypothyroidism    Past Surgical History:  Procedure Laterality Date   ABDOMINAL SURGERY     found to have an ovarian cyst and endometriosis   APPENDECTOMY     Family History  Problem Relation Age of Onset   Lung disease Father    Heart attack Father    Heart disease Mother        myocardial infarction   Breast cancer Mother 84   Heart attack Mother    Hypertension Mother    Hyperlipidemia Mother    Breast cancer Sister 42   Hypertension Sister    Rheum arthritis Sister    Breast cancer Maternal Aunt 54   Hypertension Brother    Colon cancer  Neg Hx    Social History   Socioeconomic History   Marital status: Married    Spouse name: Not on file   Number of children: Not on file   Years of education: Not on file   Highest education level: Not on file  Occupational History   Not on file  Tobacco Use   Smoking status: Never   Smokeless tobacco: Never  Vaping Use   Vaping Use: Never used  Substance and Sexual Activity   Alcohol use: No    Alcohol/week: 0.0 standard drinks   Drug use: No   Sexual activity: Never  Other Topics Concern   Not on file  Social History Narrative   Not on file   Social Determinants of Health   Financial Resource Strain: Low Risk    Difficulty of Paying Living Expenses: Not hard at all  Food Insecurity: No Food Insecurity   Worried About Charity fundraiser in the Last Year: Never true   Ran Out of Food in the Last Year: Never true  Transportation Needs: No Transportation Needs   Lack of Transportation (Medical): No   Lack of Transportation (Non-Medical): No  Physical Activity: Sufficiently Active   Days of Exercise per Week: 5 days   Minutes of Exercise per  Session: 30 min  Stress: No Stress Concern Present   Feeling of Stress : Not at all  Social Connections: Unknown   Frequency of Communication with Friends and Family: More than three times a week   Frequency of Social Gatherings with Friends and Family: More than three times a week   Attends Religious Services: More than 4 times per year   Active Member of [employer] or Organizations: Not on file   Attends Archivist Meetings: Not on file   Marital Status: Married    Outpatient Encounter Medications as of 04/11/2021  Medication Sig   Biotin 5 MG CAPS Take 5,000 mcg by mouth daily.   carvedilol (COREG) 6.25 MG tablet TAKE 1 TABLET TWICE A DAY (Patient taking differently: Take 6.25 mg by mouth 2 (two) times daily.)   cholecalciferol (VITAMIN D) 25 MCG (1000 UNIT) tablet Take 1,000 Units by mouth daily.   dorzolamide-timolol  (COSOPT) 22.3-6.8 MG/ML ophthalmic solution Place 1 drop into both eyes 2 (two) times daily.   levothyroxine (SYNTHROID) 75 MCG tablet Take 1 tablet (75 mcg total) by mouth daily.   lisinopril (ZESTRIL) 40 MG tablet Take 1 tablet (40 mg total) by mouth daily.   Multiple Vitamins-Minerals (ZINC PO) Take 1 tablet by mouth daily.   omeprazole (PRILOSEC) 20 MG capsule Take 1 capsule (20 mg total) by mouth daily.   simvastatin (ZOCOR) 10 MG tablet TAKE 1 TABLET AT BEDTIME (Patient taking differently: Take 10 mg by mouth at bedtime.)   spironolactone (ALDACTONE) 25 MG tablet TAKE 1 TABLET DAILY (Patient taking differently: Take 25 mg by mouth daily.)   No facility-administered encounter medications on file as of 04/11/2021.     Review of Systems  Constitutional:  Negative for appetite change and unexpected weight change.  HENT:  Negative for congestion and sinus pressure.   Respiratory:  Negative for cough, chest tightness and shortness of breath.   Cardiovascular:  Negative for chest pain, palpitations and leg swelling.  Gastrointestinal:  Negative for abdominal pain, diarrhea, nausea and vomiting.  Genitourinary:  Negative for difficulty urinating and dysuria.  Musculoskeletal:  Negative for joint swelling and myalgias.  Skin:  Negative for color change and rash.  Neurological:  Positive for dizziness and light-headedness. Negative for headaches.  Psychiatric/Behavioral:  Negative for agitation and dysphoric mood.       Objective:    Physical Exam Vitals reviewed.  Constitutional:      General: She is not in acute distress.    Appearance: Normal appearance.  HENT:     Head: Normocephalic and atraumatic.     Right Ear: External ear normal.     Left Ear: External ear normal.  Eyes:     General: No scleral icterus.       Right eye: No discharge.        Left eye: No discharge.     Conjunctiva/sclera: Conjunctivae normal.  Neck:     Thyroid: No thyromegaly.  Cardiovascular:     Rate  and Rhythm: Normal rate and regular rhythm.  Pulmonary:     Effort: No respiratory distress.     Breath sounds: Normal breath sounds. No wheezing.  Abdominal:     General: Bowel sounds are normal.     Palpations: Abdomen is soft.     Tenderness: There is no abdominal tenderness.  Musculoskeletal:        General: No swelling or tenderness.     Cervical back: Neck supple. No tenderness.  Lymphadenopathy:  Cervical: No cervical adenopathy.  Skin:    Findings: No erythema or rash.  Neurological:     Mental Status: She is alert.  Psychiatric:        Mood and Affect: Mood normal.        Behavior: Behavior normal.    BP 122/72   Pulse 67   Temp 97.6 F (36.4 C)   Resp 16   Ht 5\' 2"  (1.575 m)   Wt 128 lb (58.1 kg)   LMP 10/21/1984   SpO2 99%   BMI 23.41 kg/m  Wt Readings from Last 3 Encounters:  04/11/21 128 lb (58.1 kg)  03/15/21 129 lb 6.4 oz (58.7 kg)  03/02/21 132 lb 12.8 oz (60.2 kg)     Lab Results  Component Value Date   WBC 7.9 03/02/2021   HGB 14.4 03/02/2021   HCT 42.4 03/02/2021   PLT 297.0 03/02/2021   GLUCOSE 98 04/13/2021   CHOL 148 04/13/2021   TRIG 175.0 (H) 04/13/2021   HDL 48.00 04/13/2021   LDLDIRECT 88.0 03/22/2021   LDLCALC 65 04/13/2021   ALT 13 04/13/2021   AST 13 04/13/2021   NA 131 (L) 04/13/2021   K 4.4 04/13/2021   CL 98 04/13/2021   CREATININE 0.89 04/13/2021   BUN 11 04/13/2021   CO2 26 04/13/2021   TSH 0.71 03/02/2021   INR 1.0 08/12/2015    DG Bone Density  Result Date: 03/24/2021 EXAM: DUAL X-RAY ABSORPTIOMETRY (DXA) FOR BONE MINERAL DENSITY IMPRESSION: Dear Dr. Nicki Reaper, Your patient Williams Che completed a FRAX assessment on 03/24/2021 using the [place] (analysis version: 14.10) manufactured by [employer]. The following summarizes the results of our evaluation. PATIENT BIOGRAPHICAL: Name: Pamala, Hayman Patient ID: [id number] Birth Date: 07-30-1941 Height:    62.0 in. Gender:     [age]    [age]        Weight:    128.7 lbs. Ethnicity:  White                            Exam Date: 03/24/2021 FRAX* RESULTS:  (version: 3.5) 10-year Probability of Fracture1 Major Osteoporotic Fracture2 Hip Fracture 15.8% 5.0% Population: [place] (Caucasian) Risk Factors: None Based on Femur (Right) Neck BMD 1 -The 10-year probability of fracture may be lower than reported if the patient has received treatment. 2 -Major Osteoporotic Fracture: Clinical Spine, Forearm, Hip or Shoulder *FRAX is a Materials engineer of the [employer] for Metabolic Bone Disease, a [place] (WHO) [facility]. ASSESSMENT: The probability of a major osteoporotic fracture is 15.8% within the next ten years. The probability of a hip fracture is 5.0% within the next ten years. . Your patient Terryl Niziolek completed a BMD test on 03/24/2021 using the [place] (software version: 14.10) manufactured by [employer]. The following summarizes the results of our evaluation. Technologist: [facility] PATIENT BIOGRAPHICAL: Name: Chandell, Attridge Patient ID:  [id number] Birth Date: [birth year] Height:     62.0 in. Gender:      Female    Exam Date:  03/24/2021 Weight:     128.7 lbs. Indications:           Fractures:             Treatments: DENSITOMETRY RESULTS: Site         Region     Measured Date  Measured Age WHO Classification Young Adult T-score BMD         %Change vs. Previous Significant Change (*) DualFemur Neck Right 03/24/2021 80.2 Osteopenia -2.1 0.750 g/cm2 Left Forearm Radius 33% 03/24/2021 81.2 Osteopenia -1.7 0.726 g/cm2 ASSESSMENT: The BMD measured at Femur Neck Right is 0.750 g/cm2 with a T-score of -2.1. This patient is considered osteopenic according to [facility]) criteria. The scan quality is good. Lumbar spine was not utilized due to advanced degenerative changes. World Pharmacologist [facility]) criteria for post-menopausal, Caucasian Women: Normal:                    T-score at or above -1 SD Osteopenia/low bone mass: T-score between -1 and -2.5 SD Osteoporosis:             T-score at or below -2.5 SD RECOMMENDATIONS: 1. All patients should optimize calcium and vitamin D intake. 2. Consider FDA-approved medical therapies in postmenopausal women and men aged 48 years and older, based on the following: a. A hip or vertebral(clinical or morphometric) fracture b. T-score < -2.5 at the femoral neck or spine after appropriate evaluation to exclude secondary causes c. Low bone mass (T-score between -1.0 and -2.5 at the femoral neck or spine) and a 10-year probability of a hip fracture > 3% or a 10-year probability of a major osteoporosis-related fracture > 20% based on the [place]-adapted WHO algorithm 3. Clinician judgment and/or patient preferences may indicate treatment for people with 10-year fracture probabilities above or below these levels FOLLOW-UP: People with diagnosed cases of osteoporosis or at high risk for fracture should have regular bone mineral density tests. For patients eligible for Medicare, routine testing is allowed once every 2 years. The testing frequency can be increased to one year for patients who have rapidly progressing disease, those who are receiving or discontinuing medical therapy to restore bone mass, or have additional risk factors. I have reviewed this report, and agree with the above findings. Mark A. Thornton Papas, M.D. [facility] Electronically Signed   By: Lavonia Dana M.D.   On: 03/24/2021 10:37       Assessment & Plan:   Problem List Items Addressed This Visit     Aortic atherosclerosis ([place])    Continue simvastatin.       Dizziness    Persistent issue.  Evaluated by ENT.  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  Recommended vestibular rehab.  Planning to to start.       Fatty liver    Found on ultrasound.  Follow liver function test.       Hypercholesterolemia - Primary    Continue simvastatin.  Low-cholesterol diet and  exercise.  Follow lipid panel and liver function test.       Relevant Orders   Lipid panel (Completed)   Hepatic function panel (Completed)   Basic metabolic panel (Completed)   Hepatic function panel   Lipid panel   Hypertension    Blood pressure overall has been doing better.  She remains on lisinopril, Coreg, and Aldactone.  Sodium has been an issue recently.  Hold on changing medication at this time.  No evidence of volume overload.  Recheck metabolic panel today.       Relevant Orders   Basic metabolic panel   Hyponatremia    Low sodium found recently during a recent hospitalization.  Has been drinking an increased amount of water.  Was given IV fluids.  I discussed decreasing the amount of free  water.  Sodium had improved.  We will recheck today.  Recent work-up included serum osmolality, urine osmolality.  Recheck today.  Reviewed.  If persistent problems may need a.m. cortisol check.  Follow.        Relevant Orders   Basic metabolic panel (Completed)   Hypothyroidism    TSH checked 03/02/2021 was within normal limits.  Continue current thyroid dose.       Relevant Orders   TSH     Einar Pheasant, MD

## 2021-04-12 ENCOUNTER — Other Ambulatory Visit: Payer: Self-pay

## 2021-04-12 ENCOUNTER — Other Ambulatory Visit: Payer: Self-pay | Admitting: Internal Medicine

## 2021-04-12 DIAGNOSIS — E871 Hypo-osmolality and hyponatremia: Secondary | ICD-10-CM

## 2021-04-12 NOTE — Progress Notes (Signed)
Order placed for nephrology referral.   °

## 2021-04-13 ENCOUNTER — Other Ambulatory Visit: Payer: Self-pay

## 2021-04-13 ENCOUNTER — Other Ambulatory Visit (INDEPENDENT_AMBULATORY_CARE_PROVIDER_SITE_OTHER): Payer: Medicare Other

## 2021-04-13 DIAGNOSIS — E7800 Pure hypercholesterolemia, unspecified: Secondary | ICD-10-CM | POA: Diagnosis not present

## 2021-04-13 LAB — LIPID PANEL
Cholesterol: 148 mg/dL (ref 0–200)
HDL: 48 mg/dL (ref >39.00)
LDL Cholesterol: 65 mg/dL (ref 0–99)
NonHDL: 100.01
Total CHOL/HDL Ratio: 3
Triglycerides: 175 mg/dL — ABNORMAL HIGH (ref 0.0–149.0)
VLDL: 35 mg/dL (ref 0.0–40.0)

## 2021-04-13 LAB — BASIC METABOLIC PANEL
BUN: 11 mg/dL (ref 6–23)
CO2: 26 mEq/L (ref 19–32)
Calcium: 10.3 mg/dL (ref 8.4–10.5)
Chloride: 98 mEq/L (ref 96–112)
Creatinine, Ser: 0.89 mg/dL (ref 0.40–1.20)
GFR: 60.84 mL/min (ref >60.00)
Glucose, Bld: 98 mg/dL (ref 70–99)
Potassium: 4.4 mEq/L (ref 3.5–5.1)
Sodium: 131 mEq/L — ABNORMAL LOW (ref 135–145)

## 2021-04-13 LAB — HEPATIC FUNCTION PANEL
ALT: 13 U/L (ref 0–35)
AST: 13 U/L (ref 0–37)
Albumin: 4.4 g/dL (ref 3.5–5.2)
Alkaline Phosphatase: 61 U/L (ref 39–117)
Bilirubin, Direct: 0.2 mg/dL (ref 0.0–0.3)
Total Bilirubin: 1.4 mg/dL — ABNORMAL HIGH (ref 0.2–1.2)
Total Protein: 6.1 g/dL (ref 6.0–8.3)

## 2021-04-17 ENCOUNTER — Encounter: Payer: Self-pay | Admitting: Internal Medicine

## 2021-04-17 DIAGNOSIS — I700 Atherosclerosis of aorta: Secondary | ICD-10-CM | POA: Insufficient documentation

## 2021-04-17 NOTE — Assessment & Plan Note (Addendum)
Continue simvastatin.  Low-cholesterol diet and exercise.  Follow lipid panel and liver function test.

## 2021-04-17 NOTE — Assessment & Plan Note (Signed)
TSH checked 03/02/2021 was within normal limits.  Continue current thyroid dose.

## 2021-04-17 NOTE — Assessment & Plan Note (Signed)
Low sodium found recently during a recent hospitalization.  Has been drinking an increased amount of water.  Was given IV fluids.  I discussed decreasing the amount of free water.  Sodium had improved.  We will recheck today.  Recent work-up included serum osmolality, urine osmolality.  Recheck today.  Reviewed.  If persistent problems may need a.m. cortisol check.  Follow.

## 2021-04-17 NOTE — Assessment & Plan Note (Signed)
Persistent issue.  Evaluated by ENT.  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  Recommended vestibular rehab.  Planning to to start.

## 2021-04-17 NOTE — Assessment & Plan Note (Signed)
Blood pressure overall has been doing better.  She remains on lisinopril, Coreg, and Aldactone.  Sodium has been an issue recently.  Hold on changing medication at this time.  No evidence of volume overload.  Recheck metabolic panel today.

## 2021-04-17 NOTE — Assessment & Plan Note (Signed)
Continue simvastatin. 

## 2021-04-17 NOTE — Assessment & Plan Note (Signed)
Found on ultrasound.  Follow liver function test.

## 2021-04-26 ENCOUNTER — Other Ambulatory Visit: Payer: Self-pay

## 2021-04-26 ENCOUNTER — Ambulatory Visit: Payer: Medicare Other | Attending: Unknown Physician Specialty | Admitting: Physical Therapy

## 2021-04-26 ENCOUNTER — Encounter: Payer: Self-pay | Admitting: Physical Therapy

## 2021-04-26 VITALS — BP 156/69

## 2021-04-26 DIAGNOSIS — R2681 Unsteadiness on feet: Secondary | ICD-10-CM | POA: Insufficient documentation

## 2021-04-26 DIAGNOSIS — R42 Dizziness and giddiness: Secondary | ICD-10-CM | POA: Diagnosis not present

## 2021-04-26 NOTE — Therapy (Signed)
[place] MAIN [facility] SERVICES [place], [place], [address] Phone: [phone]   Fax:  [phone]  Physical Therapy Evaluation  Patient Details  Name: Crystal Lynch MRN: [id number] Date of Birth: 1941/10/04 Referring Provider (PT): Dr. Tami Ribas  Encounter Date: 04/26/2021   PT End of Session - 04/26/21 1440     Visit Number 1    Number of Visits 9    Date for PT Re-Evaluation 06/21/21    PT Start Time 1355    PT Stop Time 1455    PT Time Calculation (min) 60 min    Equipment Utilized During Treatment Gait belt    Activity Tolerance Patient tolerated treatment well    Behavior During Therapy WFL for tasks assessed/performed             Past Medical History:  Diagnosis Date   Arthritis    Chicken pox    Endometriosis    Esophagitis    s/p esophageal dilatation   GERD (gastroesophageal reflux disease)    History of ovarian cyst    Hypercholesterolemia    Hypertension    Hypothyroidism     Past Surgical History:  Procedure Laterality Date   ABDOMINAL SURGERY     found to have an ovarian cyst and endometriosis   APPENDECTOMY      Vitals:   04/26/21 1442 04/26/21 1443 04/26/21 1446  BP: (!) 169/61 (!) 184/70 (!) 156/69     [facility] PT Assessment - 04/26/21 1434       Assessment   Medical Diagnosis Dizziness and giddiness    Referring Provider (PT) Dr. Tami Ribas    Prior Therapy No prior vestibular therapy      Precautions   Precautions Fall      Restrictions   Weight Bearing Restrictions No      Balance Screen   Has the patient fallen in the past 6 months No    Has the patient had a decrease in activity level because of a fear of falling?  No    Is the patient reluctant to leave their home because of a fear of falling?  No      Home Environment   Living Environment Private residence    Living Arrangements Spouse/significant other    Type of [place] to enter    Entrance  Stairs-Number of Steps 2 without rails at back of house    Entrance Stairs-Rails None    Home Layout One level    [place] - quad      Prior Function   Level of Independence Independent with community mobility with device;Independent with household mobility without device;Independent with basic ADLs    Vocation Retired      Charity fundraiser Status Within Functional Limits for tasks assessed      Standardized Balance Assessment   Standardized Balance Assessment Dynamic Gait Index      Dynamic Gait Index   Level Surface Normal    Change in Gait Speed Normal    Gait with Horizontal Head Turns Mild Impairment    Gait with Vertical Head Turns Mild Impairment    Gait and Pivot Turn Mild Impairment   dizziness turning to the left and increased time   Step Over Obstacle Normal    Step Around Obstacles Normal    Steps Moderate Impairment    Total Score 19  VESTIBULAR AND BALANCE EVALUATION  HISTORY:  Subjective history of current problem: Patient reports that she began having dizziness within the past year. Patient was admitted to the hospital due to hyponatremia and dizziness on Feb 23, 2021. Patient was seen by Dr. Tami Ribas, ENT physician and underwent VNG testing. Per Dr. Ileene Hutchinson visit note on 5/31, patient with 39% left caloric weakness noted on VNG test. Patient Patient describes her dizziness as lightheadedness and unsteadiness. Patient denies vertigo. Patient reports she is getting dizziness daily, but states it is not as severe as it was when it first began. Patient reports the dizziness lasts minutes. Patient states she notices it more if she rides an hour or more in the car and when she gets out of the car she gets more dizziness. Patient states she tries to keep her head straight ahead. Patient states when she sits for a long period of time at church she gets dizzy and lightheaded when she stands, but it eases as she moves around. Aggravating  factors include dizziness after sitting for long periods of time, looking up, quick movements and head movements. Patient reports that standing in place and staying still helps to ease her symptoms. Patient reports that she uses [employer] in the community and reports no AD use in the home, but states she does touch furniture at times for balance when ambulating about her home. Patient reports that she veers when she walks at times.   Progression of symptoms: worse initially and states now her symptoms are better but still persistent History of similar episodes: no  Falls (yes/no): no Number of falls in past 6 months: 0  Auditory complaints (tinnitus, pain, drainage): denies except reports sinus drainage Vision (last eye exam, diplopia, recent changes): patient wears eye glasses and goes every six months to see eye doctor.  Denies vision changes; pt reports that her eyes water a lot and can get red at times. Encouraged patient to follow up with eye doctor and patient reports she has a follow-up appointment coming up soon.   Current Symptoms: (dysarthria, dysphagia, drop attacks, bowel and bladder changes, recent weight loss/gain)  Review of systems negative for red flags.   EXAMINATION POSTURE:  Patient with right shoulder elevated greater than left shoulder in standing at rest and noted rounded shoulders with kyphotic posture.  COORDINATION: Finger to Nose: Deferred  Past Pointing:  Deferred  MUSCULOSKELETAL SCREEN: Cervical Spine ROM: Cervical active range of motion flexion, extension, right rotation and left rotation within normal limits without pain.  Gait: Patient arrives ambulating with short base quad cane.  Patient ambulates with step through gait pattern with decreased gait speed and decreased scanning of the visual environment.  Patient with mild veering noted with ambulation with head turns and ambulation when varying gait speed.  Balance: Patient is challenged by narrow base of  support, uneven surfaces, eyes closed, single-leg stance and activities involving body turns and head turns.  POSTURAL CONTROL TESTS:  Clinical Test of Sensory Interaction for Balance (CTSIB):  CONDITION TIME STRATEGY SWAY  Eyes open, firm surface 30 seconds ankle +1  Eyes closed, firm surface 30 seconds Ankle, hip +3  Eyes open, foam surface 30 seconds Ankle, hip +2  Eyes closed, foam surface 12 seconds Ankle, hip, stepping, reaching +4    OCULOMOTOR / VESTIBULAR TESTING: Oculomotor Exam- Room Light  Normal Abnormal Comments  Ocular Alignment N    Ocular ROM N    Spontaneous Nystagmus N    Gaze evoked Nystagmus N  Smooth Pursuit N    Saccades N  Very mild hypometric saccades  VOR  Abn   VOR Cancellation  Abn "Feel a little swaying"  Left Head Impulse  Abn Corrective saccades noted  Right Head Impulse N      BPPV TESTS: Deferred testing this date   FUNCTIONAL OUTCOME MEASURES:  Results Comments  DHI 40/100 Moderate perception of handicap; in need of intervention  ABC Scale 53% High falls risk; in need of intervention  DGI 19/24 Falls risk; in need of intervention  FOTO 49/100 Given the patient's risk adjustment variables, like-patients nationally had a FS score of 59/100 at intake  Dizziness positional status 60.7 Scale (39-70); in need of intervention   Orthostatic vital signs: In supine, patient's blood pressure was 169/61. In sitting, patient's blood pressure was 184/70. In standing, patient's blood pressure was 156/69. Patient denies dizziness during transitional movements with orthostatic vital sign testing. Will plan on retesting orthostatic vital signs next session.  VOR X 1 exercise:  Demonstrated and educated as to VOR X1.  Patient performed VOR X 1 horizontal in sitting 1 repetition of 30 seconds and 3 reps of 1 minute each with verbal cues for technique.  Discussed chin tuck technique patient reports this helped to reduce clicking sensation in her  neck. Patient reports no increase in dizziness with this activity. Added VOR x1 horizontal in sitting with plain background to home exercise program.  Handout provided.    PT Education - 04/26/21 1702     Education Details Patient educated as to vestibular rehabilitation and VOR x1 exercise, added VOR x1 horizontal in sitting 1 minute repetitions to home exercise program.    Person(s) Educated Patient    Methods Explanation;Demonstration;Verbal cues;Handout    Comprehension Verbalized understanding;Returned demonstration              PT Short Term Goals - 04/26/21 1658       PT SHORT TERM GOAL #1   Title Pt will be independent with HEP in order to improve balance and decrease dizziness symptoms in order to decrease fall risk and improve function at home and for self-management.    Time 4    Period Weeks    Status New    Target Date 05/24/21               PT Long Term Goals - 04/26/21 1658       PT LONG TERM GOAL #1   Title Patient will have improved FOTO score of 5 points or greater in order to demonstrate improvements in patient's ADLs and functional performance.    Baseline Scored 49/100 on 04/26/2021;    Time 8    Period Weeks    Status New    Target Date 06/21/21      PT LONG TERM GOAL #2   Title Patient will reduce perceived disability to low levels as indicated by <40 on Dizziness Handicap Inventory.    Baseline Scored 40/100 on 04/26/2021;    Time 8    Period Weeks    Status New    Target Date 06/21/21      PT LONG TERM GOAL #3   Title Patient will reduce falls risk as indicated by Activities Specific Balance Confidence Scale (ABC) >67%.    Baseline Scored 53% on 04/26/2021;    Time 8    Period Weeks    Status New    Target Date 06/21/21      PT LONG TERM GOAL #4  Title Patient will demonstrate reduced falls risk as evidenced by Dynamic Gait Index (DGI) 21/24 or greater.    Baseline Scored 19/24 on 04/26/2021;    Time 8    Period Weeks    Status New     Target Date 06/21/21                    Plan - 04/27/21 1144     Clinical Impression Statement Patient reports that she has been getting daily episodes of dizziness that began earlier this year. Per medical record, VNG testing revealed 39% left caloric weakness. Patient denies vertigo and reports lightheadedness and imbalance sensation. Patient with abnormal VOR, VOR cancellation and left head impulse test this date. Patient scored 53% on teh ABC scale indicating high falls risk. Patient scored 40/100 on the [facility] indicating moderater perception of handicap adn 49/100 on FOTO which is below average as compared to like patients nationally. Patient scored 19/24 on the DGI. Patient would benefit from vestibular PT services to try to address goals and functional deficits and to try to reduce patient's falls risk.    Personal Factors and Comorbidities Comorbidity 3+    Comorbidities hypertension, hypothyroidism, hypercholseterolemia    Stability/Clinical Decision Making Evolving/Moderate complexity    Clinical Decision Making Moderate    Rehab Potential Good    PT Frequency 1x / week    PT Duration 8 weeks    PT Treatment/Interventions Canalith Repostioning;Gait training;Stair training;Therapeutic exercise;Balance training;Neuromuscular re-education;Patient/family education;Vestibular    PT Next Visit Plan review VOR X 1, firm and foam FT and ST with head turns, amb targets in hallway    PT Home Exercise Plan VOR X 1 horizontal in sitting 1 minute reps    Consulted and Agree with Plan of Care Patient             Patient will benefit from skilled therapeutic intervention in order to improve the following deficits and impairments:  Dizziness, Decreased balance, Difficulty walking  Visit Diagnosis: Dizziness and giddiness - Plan: PT plan of care cert/re-cert  Unsteadiness on feet - Plan: PT plan of care cert/re-cert     Problem List Patient Active Problem List   Diagnosis Date  Noted   Aortic atherosclerosis ([place]) 04/17/2021   Right acute serous otitis media    Hyponatremia 02/23/2021   Prolonged QT interval 02/23/2021   Dizziness 09/12/2020   Fall 03/08/2020   Fatty liver 12/28/2019   Chest pain 07/25/2019   Frequent urinary tract infections 12/24/2018   Acute cystitis without hematuria 10/11/2018   Hypercalcemia 01/09/2017   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

## 2021-04-28 ENCOUNTER — Other Ambulatory Visit: Payer: Self-pay | Admitting: Internal Medicine

## 2021-05-04 ENCOUNTER — Encounter: Payer: Self-pay | Admitting: Physical Therapy

## 2021-05-04 ENCOUNTER — Ambulatory Visit: Payer: Medicare Other | Admitting: Physical Therapy

## 2021-05-04 ENCOUNTER — Other Ambulatory Visit: Payer: Self-pay

## 2021-05-04 DIAGNOSIS — R2681 Unsteadiness on feet: Secondary | ICD-10-CM

## 2021-05-04 DIAGNOSIS — R42 Dizziness and giddiness: Secondary | ICD-10-CM

## 2021-05-04 NOTE — Therapy (Addendum)
[place] MAIN [facility] SERVICES [place], [place], [address] Phone: [phone]   Fax:  [phone]  Physical Therapy Treatment  Patient Details  Name: Crystal Lynch MRN: [id number] Date of Birth: 1941/01/27 Referring Provider (PT): Dr. Tami Ribas   Encounter Date: 05/04/2021   PT End of Session - 05/04/21 1302     Visit Number 2    Number of Visits 9    Date for PT Re-Evaluation 06/21/21    PT Start Time 1301    PT Stop Time 1400    PT Time Calculation (min) 59 min    Equipment Utilized During Treatment Gait belt    Activity Tolerance Patient tolerated treatment well    Behavior During Therapy WFL for tasks assessed/performed             Past Medical History:  Diagnosis Date   Arthritis    Chicken pox    Endometriosis    Esophagitis    s/p esophageal dilatation   GERD (gastroesophageal reflux disease)    History of ovarian cyst    Hypercholesterolemia    Hypertension    Hypothyroidism     Past Surgical History:  Procedure Laterality Date   ABDOMINAL SURGERY     found to have an ovarian cyst and endometriosis   APPENDECTOMY      Vitals:     Subjective Assessment - 05/04/21 1304     Subjective Patient states that she had increased dizziness this date. Patient states she went to the ENT doctor this morning. Patient states the doctor cleaned out her ears and said to continue with therapy. She states she did really well over the weekend and that she was able to greet at [facility] on Sunday, which she had not been doing recently because of her dizziness symptoms. Patient states she has been doing her exercise 3 times a day and states it seems to be helping as she has been able to get out and do more things.    Pertinent History Patient reports that she began having dizziness within the past year. Patient was admitted to the hospital due to hyponatremia and dizziness on Feb 23, 2021. Patient was seen by Dr. Tami Ribas, ENT  physician and underwent VNG testing. Per Dr. Ileene Hutchinson visit note on 5/31, patient with 39% left caloric weakness noted on VNG test. Patient Patient describes her dizziness as lightheadedness and unsteadiness. Patient denies vertigo. Patient reports she is getting dizziness daily, but states it is not as severe as it was when it first began. Patient reports the dizziness lasts minutes. Patient states she notices it more if she rides an hour or more in the car and when she gets out of the car she gets more dizziness. Patient states she tries to keep her head straight ahead. Patient states when she sits for a long period of time at church she gets dizzy and lightheaded when she stands, but it eases as she moves around. Aggravating factors include dizziness after sitting for long periods of time, looking up, quick movements and head movements. Patient reports that standing in place and staying still helps to ease her symptoms. Patient reports that she uses [employer] in the community and reports no AD use in the home, but states she does touch furniture at times for balance when ambulating about her home. Patient reports that she veers when she walks at times.    Limitations Sitting;Standing;House hold activities    Patient Stated Goals patient would like  to have decreased dizziness and improved balance             Neuromuscular Re-education:  Dix-Hallpike test:  Performed left and right Dix-Hallpike and both were negative with no nystagmus observed and patient denied dizziness.  Patient reported some mild lightheadedness in right Dix-Hallpike position.   VOR X 1 exercise:  Patient performed VOR X 1 horizontal in standing 3 reps of 1 minute each with verbal cues for technique as initially patient was rocking head from side to side and not maintaining good contact on the target. Patient improved with practice and patient reported 5/10 dizziness with this activity. During VOR X 1, patient had a few times where  her eyes deviated from the target, but she was able to scan to relocate the target again.  Airex pad:  On firm surface and then on Airex pad, patient performed feet together progressions (progressed to feet about 1" apart) and semi-tandem progressions with alternating lead leg with and without horizontal and vertical head turns.  Discussed safety precautions with performing these exercises for HEP at home. Demonstrated and discussed standing in corner with chair in front for safety and then patient demonstrated back. Patient reports mild increase in dizziness with these activities and that it was challenging for her balance.   Ambulation with head turns:  Patient performed 175' forwards and retro ambulation with SBQC while scanning for targets in hallway with CGA.  Patient with markedly decreased gait speed with retro ambulation and increased imbalance and required one short standing rest break.  Patient reports mild dizziness rated 5/10 and imbalance with this activity.   MedBridge: Access Code: [email or web address] URL: [email or web address] Date: 05/04/2021 Prepared by: Lady Deutscher  Exercises Seated VOR X1 exercise - 3 x daily - 7 x weekly - 1 sets - 3 reps - 1 minute hold Feet Together with Vertical Head turns - 1 x daily - 7 x weekly - 1 sets - 5 reps - 1 minute hold Feet Together Balance with Head Rotation - 1 x daily - 7 x weekly - 1 sets - 5 reps - 1 minute hold Half Tandem Stance Balance with Head Rotation - 1 x daily - 7 x weekly - 1 sets - 5 reps - 1 minute hold    PT Education - 05/04/21 1521     Education Details Reviewed and reeducated as to how to perform VOR X 1 exercise and added feet together and semi-tandem progressions on firm surface with horizontal and vertical head turns to home exercise program using MedBridge. Emailed [place] code to patient so that she can access HEP and videos of the exercises.    Person(s) Educated Patient    Methods Explanation;Verbal  cues;Demonstration;Other (comment)    Comprehension Verbalized understanding;Returned demonstration              PT Short Term Goals - 04/26/21 1658       PT SHORT TERM GOAL #1   Title Pt will be independent with HEP in order to improve balance and decrease dizziness symptoms in order to decrease fall risk and improve function at home and for self-management.    Time 4    Period Weeks    Status New    Target Date 05/24/21               PT Long Term Goals - 04/26/21 1658       PT LONG TERM GOAL #1   Title Patient will have improved FOTO score of 5 points  or greater in order to demonstrate improvements in patient's ADLs and functional performance.    Baseline Scored 49/100 on 04/26/2021;    Time 8    Period Weeks    Status New    Target Date 06/21/21      PT LONG TERM GOAL #2   Title Patient will reduce perceived disability to low levels as indicated by <40 on Dizziness Handicap Inventory.    Baseline Scored 40/100 on 04/26/2021;    Time 8    Period Weeks    Status New    Target Date 06/21/21      PT LONG TERM GOAL #3   Title Patient will reduce falls risk as indicated by Activities Specific Balance Confidence Scale (ABC) >67%.    Baseline Scored 53% on 04/26/2021;    Time 8    Period Weeks    Status New    Target Date 06/21/21      PT LONG TERM GOAL #4   Title Patient will demonstrate reduced falls risk as evidenced by Dynamic Gait Index (DGI) 21/24 or greater.    Baseline Scored 19/24 on 04/26/2021;    Time 8    Period Weeks    Status New    Target Date 06/21/21                   Plan - 05/04/21 1524     Clinical Impression Statement Patient with negative bilateral Dix-Hallpike tests this date. Patient's balance was challenged by feet together and semi-tandem progressions with head turns and ambulation while scanning for targets activities. Patient rpeorts good compliance with HEP and reviewed and reedcuated as to proper technique with VOR X 1  exercise. Added additional exercises for home exercise program via [place]. Patient would benefit from continued vestibular PT services in order to address goals and to try to decrease patient's subjective symptoms of dizziness and imbalance.    Personal Factors and Comorbidities Comorbidity 3+    Comorbidities hypertension, hypothyroidism, hypercholseterolemia    Stability/Clinical Decision Making Evolving/Moderate complexity    Rehab Potential Good    PT Frequency 1x / week    PT Duration 8 weeks    PT Treatment/Interventions Canalith Repostioning;Gait training;Stair training;Therapeutic exercise;Balance training;Neuromuscular re-education;Patient/family education;Vestibular    PT Next Visit Plan review VOR X 1, firm and foam FT and ST with head turns, try body wall rolls    PT Home Exercise Plan VOR X 1 horizontal in sitting 1 minute reps and added feet together and semi-tandem progressions on firm surface with horizontal and vertical head turns via MedBridge; MedBridge:  Access Code: [email or web address]  URL: [email or web address]    Consulted and Agree with Plan of Care Patient             Patient will benefit from skilled therapeutic intervention in order to improve the following deficits and impairments:  Dizziness, Decreased balance, Difficulty walking  Visit Diagnosis: Dizziness and giddiness  Unsteadiness on feet     Problem List Patient Active Problem List   Diagnosis Date Noted   Aortic atherosclerosis ([place]) 04/17/2021   Right acute serous otitis media    Hyponatremia 02/23/2021   Prolonged QT interval 02/23/2021   Dizziness 09/12/2020   Fall 03/08/2020   Fatty liver 12/28/2019   Chest pain 07/25/2019   Frequent urinary tract infections 12/24/2018   Acute cystitis without hematuria 10/11/2018   Hypercalcemia 01/09/2017   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 [place], [place], [address] Phone: [phone]   Fax:  [phone]  Name: Crystal Lynch MRN: [id number] Date of Birth: Nov 17, 1940

## 2021-05-11 ENCOUNTER — Other Ambulatory Visit: Payer: Self-pay

## 2021-05-11 ENCOUNTER — Ambulatory Visit: Payer: Medicare Other | Admitting: Physical Therapy

## 2021-05-11 VITALS — BP 107/50

## 2021-05-11 DIAGNOSIS — R42 Dizziness and giddiness: Secondary | ICD-10-CM

## 2021-05-11 DIAGNOSIS — R2681 Unsteadiness on feet: Secondary | ICD-10-CM

## 2021-05-11 NOTE — Therapy (Signed)
[place] MAIN [facility] SERVICES [address] [place], [place], [address] Phone: [phone]   Fax:  [phone]  Physical Therapy Treatment  Patient Details  Name: Crystal Lynch MRN: [id number] Date of Birth: [birth year] Referring Provider (PT): Dr. Tami Ribas   Encounter Date: 05/11/2021   PT End of Session - 05/11/21 1358     Visit Number 3    Number of Visits 9    Date for PT Re-Evaluation 06/21/21    PT Start Time 63    PT Stop Time 1454    PT Time Calculation (min) 56 min    Equipment Utilized During Treatment Gait belt    Activity Tolerance Patient tolerated treatment well    Behavior During Therapy WFL for tasks assessed/performed             Past Medical History:  Diagnosis Date   Arthritis    Chicken pox    Endometriosis    Esophagitis    s/p esophageal dilatation   GERD (gastroesophageal reflux disease)    History of ovarian cyst    Hypercholesterolemia    Hypertension    Hypothyroidism     Past Surgical History:  Procedure Laterality Date   ABDOMINAL SURGERY     found to have an ovarian cyst and endometriosis   APPENDECTOMY      Vitals:   05/11/21 1434 05/11/21 1442 05/11/21 1450  BP: (!) 113/45 (!) 99/49 (!) 107/50     Subjective Assessment - 05/11/21 1401     Subjective Patient states that she has been doing her exercises at home and that the feet together exercise is difficult. (talked about ways to adjust). Patient states doing the exercises in a corner is working well.    Pertinent History Patient reports that she began having dizziness within the past year. Patient was admitted to the hospital due to hyponatremia and dizziness on Feb 23, 2021. Patient was seen by Dr. Tami Ribas, ENT physician and underwent VNG testing. Per Dr. Ileene Hutchinson visit note on 5/31, patient with 39% left caloric weakness noted on VNG test. Patient Patient describes her dizziness as lightheadedness and unsteadiness. Patient denies  vertigo. Patient reports she is getting dizziness daily, but states it is not as severe as it was when it first began. Patient reports the dizziness lasts minutes. Patient states she notices it more if she rides an hour or more in the car and when she gets out of the car she gets more dizziness. Patient states she tries to keep her head straight ahead. Patient states when she sits for a long period of time at church she gets dizzy and lightheaded when she stands, but it eases as she moves around. Aggravating factors include dizziness after sitting for long periods of time, looking up, quick movements and head movements. Patient reports that standing in place and staying still helps to ease her symptoms. Patient reports that she uses SB[employer] in the community and reports no AD use in the home, but states she does touch furniture at times for balance when ambulating about her home. Patient reports that she veers when she walks at times.    Limitations Sitting;Standing;House hold activities    Patient Stated Goals patient would like to have decreased dizziness and improved balance             Neuromuscular Re-education:  VOR X 1 exercise:  Patient performed VOR X 1 horizontal in standing with conflicting background 3 reps of 1 minute each with  good technique.  Patient reports mild dizziness with this activity.   Body Wall Rolls:  Patient performed 5 reps of supported, body wall rolls with eyes open with CGA.  Patient reports 4/10 dizziness with this activity and noted imbalance with patient touching wall for support.   Airex pad:  On firm surface and then on Airex pad, patient performed feet together progressions (progressed to feet about 1.0" apart) and semi-tandem progressions with alternating lead leg with and without horizontal and vertical head turns with CGA. Performed multiple 1 minute reps of each.  Patient performed static standing with feet about 2" apart with eyes closed trials of 30-60  seconds multiple reps. Patient required CGA to Min A.  Ambulation with head turns:  Patient performed 175' trials of forwards and retro ambulation with SBQC with horizontal and vertical head turns. Patient required CGA with forwards ambulation. Patient demonstrates significant posterior lean with retro ambulation with head turns requiring Min A to Min/Mod A to correct. Patient with decreased cadence especially with retro ambulation and noted mild veering and uneven steppage.   Blood Pressure:  Patient's blood pressure was taking after activity during sitting rest break and it was 113/45 mmHg. Patient drank some water and waited about 8 minutes before retaking blood pressure. Patient's blood pressure readings were as follows: (!) 113/45 (!) 99/49 (!) 107/50  Patient reports that after she was hospitalized earlier this year due to hyponatremia and that she had to limit her water intake when her sodium was low. Patient states that Dr. Pryor Ochoa encouraged her to drink Gatorade. Patient states she has a Nephrology referral and her appointment is in September.  Encouraged patient to recheck her blood pressure after she goes home today and to call her doctor's office her diastolic blood pressure continues to be below 50 mmHg  and if she becomes symptomatic. Patient verbalized agreement. Patient states she has a home blood pressure monitor.     PT Education - 05/11/21 1358     Education Details Reviewed home exercise program and added conflicting background progression and discussed foot positioning for semi-tandem and feet together exercises. Feet together on firm was too challenging at home therefore, performed with about 1" space between feet and patient was able to perform 30-60 second trials.    Person(s) Educated Patient    Methods Explanation;Demonstration;Verbal cues    Comprehension Verbalized understanding;Returned demonstration              PT Short Term Goals - 04/26/21 1658       PT  SHORT TERM GOAL #1   Title Pt will be independent with HEP in order to improve balance and decrease dizziness symptoms in order to decrease fall risk and improve function at home and for self-management.    Time 4    Period Weeks    Status New    Target Date 05/24/21               PT Long Term Goals - 04/26/21 1658       PT LONG TERM GOAL #1   Title Patient will have improved FOTO score of 5 points or greater in order to demonstrate improvements in patient's ADLs and functional performance.    Baseline Scored 49/100 on 04/26/2021;    Time 8    Period Weeks    Status New    Target Date 06/21/21      PT LONG TERM GOAL #2   Title Patient will reduce perceived disability to low levels  as indicated by <40 on Dizziness Handicap Inventory.    Baseline Scored 40/100 on 04/26/2021;    Time 8    Period Weeks    Status New    Target Date 06/21/21      PT LONG TERM GOAL #3   Title Patient will reduce falls risk as indicated by Activities Specific Balance Confidence Scale (ABC) >67%.    Baseline Scored 53% on 04/26/2021;    Time 8    Period Weeks    Status New    Target Date 06/21/21      PT LONG TERM GOAL #4   Title Patient will demonstrate reduced falls risk as evidenced by Dynamic Gait Index (DGI) 21/24 or greater.    Baseline Scored 19/24 on 04/26/2021;    Time 8    Period Weeks    Status New    Target Date 06/21/21                Plan - 05/12/21 5093     Clinical Impression Statement Patient reports good compliance with home exercise program and states the feet together activity is very difficult for her. Reviewed home exercise program and discussed working towards feet together, but working on progressions at home. In addition, added conflicting background progression to the VOR X 1 exercise. Patient challenged by body wall rolls activity this date as this reproduced patient's dizziness and imbalance symptoms. Will plan on continuing to work on progressions of body wall  rolls next visit. Patient also challenged by feet together and semi-tandem progressions with head turning on firm and foam and ambulation with head turns. Next session, will plan on working on continued progressions of these activities. Patient would benefit from continued vestibular PT services to further address functional deficits and try to decrease patient's falls risk.    Personal Factors and Comorbidities Comorbidity 3+    Comorbidities hypertension, hypothyroidism, hypercholseterolemia    Stability/Clinical Decision Making Evolving/Moderate complexity    Rehab Potential Good    PT Frequency 1x / week    PT Duration 8 weeks    PT Treatment/Interventions Canalith Repostioning;Gait training;Stair training;Therapeutic exercise;Balance training;Neuromuscular re-education;Patient/family education;Vestibular    PT Next Visit Plan review VOR X 1, firm and foam FT and ST with head turns, try body wall rolls    PT Home Exercise Plan VOR X 1 horizontal in sitting 1 minute reps and added feet together and semi-tandem progressions on firm surface with horizontal and vertical head turns via MedBridge; MedBridge:  Access Code: [email or web address]  URL: [email or web address]    Consulted and Agree with Plan of Care Patient             Patient will benefit from skilled therapeutic intervention in order to improve the following deficits and impairments:  Dizziness, Decreased balance, Difficulty walking  Visit Diagnosis: Dizziness and giddiness  Unsteadiness on feet     Problem List Patient Active Problem List   Diagnosis Date Noted   Aortic atherosclerosis ([place]) 04/17/2021   Right acute serous otitis media    Hyponatremia 02/23/2021   Prolonged QT interval 02/23/2021   Dizziness 09/12/2020   Fall 03/08/2020   Fatty liver 12/28/2019   Chest pain 07/25/2019   Frequent urinary tract infections 12/24/2018   Acute cystitis without hematuria 10/11/2018   Hypercalcemia 01/09/2017    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 [place], [place], [address] Phone: [phone]   Fax:  [phone]  Name: Crystal Lynch MRN: [id number] Date of Birth: 10/18/1941

## 2021-05-12 ENCOUNTER — Encounter: Payer: Self-pay | Admitting: Physical Therapy

## 2021-05-15 ENCOUNTER — Other Ambulatory Visit: Payer: Self-pay | Admitting: Internal Medicine

## 2021-05-16 ENCOUNTER — Encounter: Payer: Self-pay | Admitting: Physical Therapy

## 2021-05-16 ENCOUNTER — Ambulatory Visit: Payer: Medicare Other | Admitting: Physical Therapy

## 2021-05-16 ENCOUNTER — Other Ambulatory Visit: Payer: Self-pay

## 2021-05-16 DIAGNOSIS — R42 Dizziness and giddiness: Secondary | ICD-10-CM | POA: Diagnosis not present

## 2021-05-16 DIAGNOSIS — R2681 Unsteadiness on feet: Secondary | ICD-10-CM

## 2021-05-16 NOTE — Therapy (Signed)
[place] MAIN [facility] SERVICES [address] [place], [place], [address] Phone: [phone]   Fax:  [phone]  Physical Therapy Treatment  Patient Details  Name: Crystal Lynch MRN: [id number] Date of Birth: 01/07/1941 Referring Provider (PT): Dr. Tami Ribas   Encounter Date: 05/16/2021   PT End of Session - 05/16/21 1433     Visit Number 4    Number of Visits 9    Date for PT Re-Evaluation 06/21/21    PT Start Time 1433    PT Stop Time 1517    PT Time Calculation (min) 44 min    Equipment Utilized During Treatment Gait belt    Activity Tolerance Patient tolerated treatment well    Behavior During Therapy WFL for tasks assessed/performed             Past Medical History:  Diagnosis Date   Arthritis    Chicken pox    Endometriosis    Esophagitis    s/p esophageal dilatation   GERD (gastroesophageal reflux disease)    History of ovarian cyst    Hypercholesterolemia    Hypertension    Hypothyroidism     Past Surgical History:  Procedure Laterality Date   ABDOMINAL SURGERY     found to have an ovarian cyst and endometriosis   APPENDECTOMY      There were no vitals filed for this visit.   Subjective Assessment - 05/16/21 1435     Subjective Patient states she has been taking her blood pressure at home. Patient states it has come up since she was last in the clinic but states it is still a little lower than usual for her. Patient states she will let her doctor know. Patient states that she does best in the evening in regards to her symptoms, but states when she sits for 45 minutes she will sometimes start to get dizzy.    Pertinent History Patient reports that she began having dizziness within the past year. Patient was admitted to the hospital due to hyponatremia and dizziness on Feb 23, 2021. Patient was seen by Dr. Tami Ribas, ENT physician and underwent VNG testing. Per Dr. Ileene Hutchinson visit note on 5/31, patient with 39% left  caloric weakness noted on VNG test. Patient Patient describes her dizziness as lightheadedness and unsteadiness. Patient denies vertigo. Patient reports she is getting dizziness daily, but states it is not as severe as it was when it first began. Patient reports the dizziness lasts minutes. Patient states she notices it more if she rides an hour or more in the car and when she gets out of the car she gets more dizziness. Patient states she tries to keep her head straight ahead. Patient states when she sits for a long period of time at church she gets dizzy and lightheaded when she stands, but it eases as she moves around. Aggravating factors include dizziness after sitting for long periods of time, looking up, quick movements and head movements. Patient reports that standing in place and staying still helps to ease her symptoms. Patient reports that she uses [employer] in the community and reports no AD use in the home, but states she does touch furniture at times for balance when ambulating about her home. Patient reports that she veers when she walks at times.    Limitations Sitting;Standing;House hold activities    Patient Stated Goals patient would like to have decreased dizziness and improved balance  Patient states that she does best in the evening in regards to her symptoms, but states when she sits for 45 minutes she will sometimes start to get dizzy.   Neuromuscular Re-education:  HR was 59-64 bpm.  VOR X 1 exercise:  Patient performed VOR X 1 horizontal in standing on Airex balance pad with conflicting background 3 reps of 1 minute each.  Patient denies dizziness with this activity.   Body Wall Rolls:  Patient performed 5 reps of supported, body wall rolls with eyes open with CGA. Patient touching wall throughout turning for support. Patient reports 3-4/10 dizziness with this activity.   Ambulation with head turns:  Patient performed 175' forwards ambulation with QC while  scanning for targets in hallway with CGA.   Rockerboard: On small wooden rocker board, worked on side to side and anterior/posterior sways with CGA to Min A secondary to losses of balance. Patient reaching to touch ballet bar at times due to loss of balance. Patient improved with practice.   Airex balance beam: On Airex balance beam, patient performed sidestepping left/right with and without horizontal head turns multiple reps times 5' each with CGA to Min A secondary to loss of balance. Patient tends to loss balance posteriorly and has difficulty utilizing hip strategies as she depends on ankle and reaching strategies.       PT Short Term Goals - 04/26/21 1658       PT SHORT TERM GOAL #1   Title Pt will be independent with HEP in order to improve balance and decrease dizziness symptoms in order to decrease fall risk and improve function at home and for self-management.    Time 4    Period Weeks    Status New    Target Date 05/24/21               PT Long Term Goals - 04/26/21 1658       PT LONG TERM GOAL #1   Title Patient will have improved FOTO score of 5 points or greater in order to demonstrate improvements in patient's ADLs and functional performance.    Baseline Scored 49/100 on 04/26/2021;    Time 8    Period Weeks    Status New    Target Date 06/21/21      PT LONG TERM GOAL #2   Title Patient will reduce perceived disability to low levels as indicated by <40 on Dizziness Handicap Inventory.    Baseline Scored 40/100 on 04/26/2021;    Time 8    Period Weeks    Status New    Target Date 06/21/21      PT LONG TERM GOAL #3   Title Patient will reduce falls risk as indicated by Activities Specific Balance Confidence Scale (ABC) >67%.    Baseline Scored 53% on 04/26/2021;    Time 8    Period Weeks    Status New    Target Date 06/21/21      PT LONG TERM GOAL #4   Title Patient will demonstrate reduced falls risk as evidenced by Dynamic Gait Index (DGI) 21/24 or  greater.    Baseline Scored 19/24 on 04/26/2021;    Time 8    Period Weeks    Status New    Target Date 06/21/21                   Plan - 05/17/21 0947     Clinical Impression Statement Patient motivated to session. Patient continues to be  challenged by body wall rolls and this reproduces her symptoms of dizziness. Patient challenged by rockerboard and airex balance beam activities. Patient improved with practice activities on rockerboard. Patient would benefit from continued practice with these activities. Will also plan on working on Airex pad activites next session. Patient would benefit from continued PT services to further address goals and to try to decrease patient's falls risk.    Personal Factors and Comorbidities Comorbidity 3+    Comorbidities hypertension, hypothyroidism, hypercholseterolemia    Stability/Clinical Decision Making Evolving/Moderate complexity    Rehab Potential Good    PT Frequency 1x / week    PT Duration 8 weeks    PT Treatment/Interventions Canalith Repostioning;Gait training;Stair training;Therapeutic exercise;Balance training;Neuromuscular re-education;Patient/family education;Vestibular    PT Next Visit Plan review VOR X 1, firm and foam FT and ST with head turns, try body wall rolls    PT Home Exercise Plan VOR X 1 horizontal in sitting 1 minute reps and added feet together and semi-tandem progressions on firm surface with horizontal and vertical head turns via MedBridge; MedBridge:  Access Code: [email or web address]  URL: [email or web address]    Consulted and Agree with Plan of Care Patient             Patient will benefit from skilled therapeutic intervention in order to improve the following deficits and impairments:  Dizziness, Decreased balance, Difficulty walking  Visit Diagnosis: Dizziness and giddiness  Unsteadiness on feet     Problem List Patient Active Problem List   Diagnosis Date Noted   Aortic atherosclerosis ([place])  04/17/2021   Right acute serous otitis media    Hyponatremia 02/23/2021   Prolonged QT interval 02/23/2021   Dizziness 09/12/2020   Fall 03/08/2020   Fatty liver 12/28/2019   Chest pain 07/25/2019   Frequent urinary tract infections 12/24/2018   Acute cystitis without hematuria 10/11/2018   Hypercalcemia 01/09/2017   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 [place], [place], [address] Phone: [phone]   Fax:  [phone]  Name: Nicholl Burno MRN: [id number] Date of Birth: January 25, 1941

## 2021-05-17 ENCOUNTER — Ambulatory Visit: Payer: Medicare Other | Admitting: Physical Therapy

## 2021-05-18 ENCOUNTER — Telehealth: Payer: Self-pay | Admitting: Cardiovascular Disease

## 2021-05-18 ENCOUNTER — Telehealth: Payer: Self-pay | Admitting: Internal Medicine

## 2021-05-18 DIAGNOSIS — E039 Hypothyroidism, unspecified: Secondary | ICD-10-CM

## 2021-05-18 MED ORDER — LEVOTHYROXINE SODIUM 75 MCG PO TABS: 75.0000 ug | ORAL_TABLET | Freq: Every day | ORAL | 1 refills | Status: DC

## 2021-05-18 NOTE — Telephone Encounter (Signed)
Called and spoke with Crystal Lynch. Crystal Lynch verbalized understanding and had no further questions. She requests the 22mg of synthroid sent in to the pharmacy instead of the 824m. Medication has been sent.

## 2021-05-18 NOTE — Addendum Note (Signed)
Addended by: Ezequiel Ganser on: 05/18/2021 03:12 PM   Modules accepted: Orders

## 2021-05-18 NOTE — Telephone Encounter (Signed)
Patient made aware of Dr. Arida's response and recommendation. Patient verbalized understanding and voiced appreciation for the call.  

## 2021-05-18 NOTE — Telephone Encounter (Signed)
Reviewed chart.  Synthroid was changed to 76mg per MSharyn Lull- when checked. Most recent check wnl.  Was recommended to continue on 716m q day.  Should be on 7538mq day synthroid. Please remove 55m53mynthroid off med list.

## 2021-05-18 NOTE — Telephone Encounter (Signed)
The BP readings look great . Continue same meds.

## 2021-05-18 NOTE — Telephone Encounter (Signed)
Spoke with the patient. Confirmed that she is taking her anti-hypertensive as prescribed.  Carvedilol 6.25 mg bid Spironolactone 25 mg daily Lisinopril 40 mg daily.  Adv the patient that I thought her readings provided were ok, I will However fwd them to Dr. Fletcher Anon to advise.  Patient verbalized understanding and voiced appreciation.

## 2021-05-18 NOTE — Telephone Encounter (Signed)
Patient called and would like to know why Dr Nicki Reaper changed her levothyroxine (SYNTHROID) 75 MCG tablet to [facility]. Please call patient.

## 2021-05-18 NOTE — Telephone Encounter (Signed)
Pt c/o BP issue: STAT if pt c/o blurred vision, one-sided weakness or slurred speech  1. What are your last 5 BP readings?  7/27 123/52  59 7/26 135/67  63 7/25 122/47  64 7/24 137/57  61  2. Are you having any other symptoms (ex. Dizziness, headache, blurred vision, passed out)? Light headed  3. What is your BP issue? Low over the last month, unsure if medications should be adjusted  Please advise

## 2021-05-24 ENCOUNTER — Other Ambulatory Visit: Payer: Self-pay

## 2021-05-24 ENCOUNTER — Ambulatory Visit: Payer: Medicare Other | Attending: Unknown Physician Specialty | Admitting: Physical Therapy

## 2021-05-24 ENCOUNTER — Encounter: Payer: Self-pay | Admitting: Physical Therapy

## 2021-05-24 ENCOUNTER — Telehealth: Payer: Self-pay | Admitting: Cardiovascular Disease

## 2021-05-24 VITALS — BP 107/42 | HR 80

## 2021-05-24 DIAGNOSIS — R42 Dizziness and giddiness: Secondary | ICD-10-CM | POA: Diagnosis present

## 2021-05-24 DIAGNOSIS — R2681 Unsteadiness on feet: Secondary | ICD-10-CM | POA: Insufficient documentation

## 2021-05-24 NOTE — Therapy (Signed)
[place] MAIN [facility] SERVICES [address] [place], [place], [address] Phone: [phone]   Fax:  [phone]  Physical Therapy Treatment  Patient Details  Name: Crystal Lynch MRN: [id number] Date of Birth: 06-03-1941 Referring Provider (PT): Dr. Tami Ribas   Encounter Date: 05/24/2021   PT End of Session - 05/24/21 0814     Visit Number 5    Number of Visits 9    Date for PT Re-Evaluation 06/21/21    PT Start Time 0814    PT Stop Time 0908    PT Time Calculation (min) 54 min    Equipment Utilized During Treatment Gait belt    Activity Tolerance Patient tolerated treatment well    Behavior During Therapy WFL for tasks assessed/performed             Past Medical History:  Diagnosis Date   Arthritis    Chicken pox    Endometriosis    Esophagitis    s/p esophageal dilatation   GERD (gastroesophageal reflux disease)    History of ovarian cyst    Hypercholesterolemia    Hypertension    Hypothyroidism     Past Surgical History:  Procedure Laterality Date   ABDOMINAL SURGERY     found to have an ovarian cyst and endometriosis   APPENDECTOMY      Vitals:   05/24/21 0849 05/24/21 0901  BP: (!) 123/42 (!) 107/42  Pulse: 80   SpO2: 100%      Subjective Assessment - 05/24/21 0813     Subjective Patient states her blood pressure was low again this morning, Patient states her blood pressure this am was 120/54 mmHg and HR was 71 bpm. She said she called her cardiologist last week to report that her blood pressure has been running lower with the diastolic value being 0000000 and states Dr. Fletcher Anon said that he was happy that her blood pressure was lower and that he did not want her to change any of her medications.  Patient reports she had dizziness and few times this past week but reports she feels she has seen an "improvement in her ability to get around". Patient states she has taken her blood pressure medicine this morning. She  states she takes three different medicines for her blood pressure in the am.    Pertinent History Patient reports that she began having dizziness within the past year. Patient was admitted to the hospital due to hyponatremia and dizziness on Feb 23, 2021. Patient was seen by Dr. Tami Ribas, ENT physician and underwent VNG testing. Per Dr. Ileene Hutchinson visit note on 5/31, patient with 39% left caloric weakness noted on VNG test. Patient Patient describes her dizziness as lightheadedness and unsteadiness. Patient denies vertigo. Patient reports she is getting dizziness daily, but states it is not as severe as it was when it first began. Patient reports the dizziness lasts minutes. Patient states she notices it more if she rides an hour or more in the car and when she gets out of the car she gets more dizziness. Patient states she tries to keep her head straight ahead. Patient states when she sits for a long period of time at church she gets dizzy and lightheaded when she stands, but it eases as she moves around. Aggravating factors include dizziness after sitting for long periods of time, looking up, quick movements and head movements. Patient reports that standing in place and staying still helps to ease her symptoms. Patient reports that she uses  [employer] in the community and reports no AD use in the home, but states she does touch furniture at times for balance when ambulating about her home. Patient reports that she veers when she walks at times.    Limitations Sitting;Standing;House hold activities    Patient Stated Goals patient would like to have decreased dizziness and improved balance             Neuromuscular Re-education:  Body Wall Rolls:  Patient performed 5 reps of unsupported, body wall rolls with eyes open with CGA.  Patient reports 4/10 dizziness. Patient performed 5 reps of supported, body wall rolls with eyes closed with CGA.  Patient reports 5/10 dizziness with this activity.   Airex pad:   On Airex pad, patient performed feet together progressions (progressed to feet about 1.5" apart) and semi-tandem progressions with alternating lead leg with and without horizontal and vertical head turns with CGA to Min A.  Patient tending to loss balance posterolaterally to the left.   Slow Marching: Patient performed on firm surface, slow marching with 3-5 second holds with CGA to Min A 10 reps each leg. Patient had greater difficulty with single leg stance on left leg as compared to the right leg and reached for ballet bar several times due to loss of balance.    Cone tapping: On firm surface, patient performed foot tapping to cones in series of one, two and then three targets as called out by therapist with CGA to [place].  Patient had several small losses of balance where she would touch bar for support, take a step or needed assist to correct.   Patient occasionally touching // bar for support due to losses of balance and demonstrated greater difficulty with single-leg stance on left leg as compared to the right leg.  Hallway ball toss:  In hallway, worked on ball toss against one wall with alternating quick turns to toss ball against opposite wall while tracking with eyes and head with CGA. Patient denies dizziness with this activity, but reports lightheadedness when turning at the end of the activity.   BP: (!) 123/42 (!) 107/42  Patient states her cardiologist is Dr. Fletcher Anon.  Patient reports that she contacted Dr. Tyrell Antonio office last week in regards to her blood pressure readings as her diastolic pressure had been running between 45 to 65 mmHg per patient report.  Patient states that Dr. Fletcher Anon was happy with these readings and that he did not want to change her medication at this time.  Patient's blood pressure this date was low with readings of 123/42 and 107/42 with patient being asymptomatic and denying dizziness and lightheadedness.  Patient to continue to check her blood pressures at  home.  Patient plans on calling her cardiologist again to report these most current numbers.   PT Education - 05/24/21 [phone]     Education Details discussed exercise technique and home exercise program    Person(s) Educated Patient    Methods Explanation    Comprehension Verbalized understanding              PT Short Term Goals - 04/26/21 1658       PT SHORT TERM GOAL #1   Title Pt will be independent with HEP in order to improve balance and decrease dizziness symptoms in order to decrease fall risk and improve function at home and for self-management.    Time 4    Period Weeks    Status New    Target Date 05/24/21  PT Long Term Goals - 04/26/21 1658       PT LONG TERM GOAL #1   Title Patient will have improved FOTO score of 5 points or greater in order to demonstrate improvements in patient's ADLs and functional performance.    Baseline Scored 49/100 on 04/26/2021;    Time 8    Period Weeks    Status New    Target Date 06/21/21      PT LONG TERM GOAL #2   Title Patient will reduce perceived disability to low levels as indicated by <40 on Dizziness Handicap Inventory.    Baseline Scored 40/100 on 04/26/2021;    Time 8    Period Weeks    Status New    Target Date 06/21/21      PT LONG TERM GOAL #3   Title Patient will reduce falls risk as indicated by Activities Specific Balance Confidence Scale (ABC) >67%.    Baseline Scored 53% on 04/26/2021;    Time 8    Period Weeks    Status New    Target Date 06/21/21      PT LONG TERM GOAL #4   Title Patient will demonstrate reduced falls risk as evidenced by Dynamic Gait Index (DGI) 21/24 or greater.    Baseline Scored 19/24 on 04/26/2021;    Time 8    Period Weeks    Status New    Target Date 06/21/21                   Plan - 05/24/21 1621     Clinical Impression Statement Patient reports that she has had a few episodes of dizziness this past week but states she feels she has seen an  "improvement in her ability to get around".  Patient able to progress to unsupported body wall rolls with eyes open and and supported body wall rolls with eyes closed.  Body wall rolls reproduce patient's symptoms of dizziness and will plan on performing again next session and will consider adding supported body wall rolls with eyes open to home exercise program.  Patient continues to be challenged by Airex pad balance activities.  Patient demonstrated difficulty with single-leg stance activities of slow marching and cone tapping this date and would benefit from continued practice with these activities next session.  We will plan on continuing PT services in order to try to decrease patient's fall risk and to address functional deficits.    Personal Factors and Comorbidities Comorbidity 3+    Comorbidities hypertension, hypothyroidism, hypercholseterolemia    Stability/Clinical Decision Making Evolving/Moderate complexity    Rehab Potential Good    PT Frequency 1x / week    PT Duration 8 weeks    PT Treatment/Interventions Canalith Repostioning;Gait training;Stair training;Therapeutic exercise;Balance training;Neuromuscular re-education;Patient/family education;Vestibular    PT Next Visit Plan review VOR X 1, firm and foam FT and ST with head turns, try body wall rolls    PT Home Exercise Plan VOR X 1 horizontal in sitting 1 minute reps and added feet together and semi-tandem progressions on firm surface with horizontal and vertical head turns via MedBridge; MedBridge:  Access Code: [email or web address]  URL: [email or web address]    Consulted and Agree with Plan of Care Patient             Patient will benefit from skilled therapeutic intervention in order to improve the following deficits and impairments:  Dizziness, Decreased balance, Difficulty walking  Visit Diagnosis: Dizziness and giddiness  Unsteadiness on feet  Problem List Patient Active Problem List   Diagnosis Date  Noted   Aortic atherosclerosis ([place]) 04/17/2021   Right acute serous otitis media    Hyponatremia 02/23/2021   Prolonged QT interval 02/23/2021   Dizziness 09/12/2020   Fall 03/08/2020   Fatty liver 12/28/2019   Chest pain 07/25/2019   Frequent urinary tract infections 12/24/2018   Acute cystitis without hematuria 10/11/2018   Hypercalcemia 01/09/2017   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 SERVICES [address] [place], [place], [address] Phone: [phone]   Fax:  [phone]  Name: Crystal Lynch MRN: [id number] Date of Birth: August 19, 1941

## 2021-05-24 NOTE — Telephone Encounter (Signed)
Spoke with the patient. Patient sts she her physicla therapy session today at the PT was concerned about her low bp. Readings are provided in the message below. Patient called the office a week ago to report low bp. Readings were reviewed by Dr. Fletcher Anon and he was ok with the readings and recommended no medication changes.  Patient is current taking the following antihypertensives: Carvedilol 6.25 mg bid Spironolactone 25 mg daily Lisinopril 40 mg daily.  She does also ck her bp at home. She did not provide an updated at home readings. Patient sts that she has also been experiencing light headiness and fatigue. Adv the patient that I will fwd the update to Dr. Fletcher Anon and call back with his response/recommendation.  Patient sts that is she does not answer, she rqst a detailed message be left on her voicemail.

## 2021-05-24 NOTE — Telephone Encounter (Signed)
Patient calling At physical therapy this morning they were concerned her BP is getting too low Before work out 123/42 after 107/42 Please call to discuss

## 2021-05-26 NOTE — Telephone Encounter (Signed)
Decrease lisinopril to 10 mg daily and continue other medications.

## 2021-05-31 MED ORDER — LISINOPRIL 10 MG PO TABS: 10.0000 mg | ORAL_TABLET | Freq: Every day | ORAL | 0 refills | Status: DC

## 2021-05-31 NOTE — Telephone Encounter (Addendum)
Previous conversation with the patient she requested that a detailed message be left on her voicemail if she did not answer the call. Patients home phone rings out with no voicemail option. Detailed message left on the patients cell with Dr. Tyrell Antonio recommendation for her to decrease lisinopril to 10 mg daily. Rx has been sent to the patients pharmacy. Patient is to continue her other medications as prescribed. Patient is to contact our office if any questions.

## 2021-06-07 ENCOUNTER — Ambulatory Visit: Payer: Medicare Other | Admitting: Physical Therapy

## 2021-06-09 ENCOUNTER — Telehealth: Payer: Self-pay

## 2021-06-09 MED ORDER — OMEPRAZOLE 20 MG PO CPDR: 20.0000 mg | DELAYED_RELEASE_CAPSULE | Freq: Every day | ORAL | 1 refills | Status: DC

## 2021-06-09 NOTE — Telephone Encounter (Signed)
Pt needs refill on omeprazole (PRILOSEC) 20 MG capsule sent to [facility] on [address] in [place]

## 2021-06-13 DIAGNOSIS — E871 Hypo-osmolality and hyponatremia: Secondary | ICD-10-CM | POA: Insufficient documentation

## 2021-06-14 ENCOUNTER — Other Ambulatory Visit: Payer: Self-pay

## 2021-06-14 ENCOUNTER — Encounter: Payer: Self-pay | Admitting: Physical Therapy

## 2021-06-14 ENCOUNTER — Ambulatory Visit: Payer: Medicare Other | Admitting: Physical Therapy

## 2021-06-14 VITALS — BP 138/55 | HR 58

## 2021-06-14 DIAGNOSIS — R42 Dizziness and giddiness: Secondary | ICD-10-CM

## 2021-06-14 DIAGNOSIS — R2681 Unsteadiness on feet: Secondary | ICD-10-CM

## 2021-06-14 NOTE — Therapy (Addendum)
[place] MAIN [facility] SERVICES [address] [place], [place], [address] Phone: [phone]   Fax:  [phone]  Physical Therapy Treatment/Recertification Dates of service: 04/26/2021-06/14/2021 Total number of visits: 6  Patient Details  Name: Crystal Lynch MRN: [id number] Date of Birth: 05-13-1941 Referring Provider (PT): Dr. Tami Ribas   Encounter Date: 06/14/2021   PT End of Session - 06/14/21 1057     Visit Number 6    Number of Visits 9    Date for PT Re-Evaluation 08/09/21    PT Start Time 1059    PT Stop Time 1145    PT Time Calculation (min) 46 min    Equipment Utilized During Treatment Gait belt    Activity Tolerance Patient tolerated treatment well    Behavior During Therapy WFL for tasks assessed/performed             Past Medical History:  Diagnosis Date   Arthritis    Chicken pox    Endometriosis    Esophagitis    s/p esophageal dilatation   GERD (gastroesophageal reflux disease)    History of ovarian cyst    Hypercholesterolemia    Hypertension    Hypothyroidism     Past Surgical History:  Procedure Laterality Date   ABDOMINAL SURGERY     found to have an ovarian cyst and endometriosis   APPENDECTOMY      Vitals:   06/14/21 1600  BP: (!) 138/55  Pulse: (!) 58     Subjective Assessment - 06/14/21 1059     Subjective Patient states that Dr. Fletcher Anon made some changes in her blood pressure medications.  Patient states she also saw a nephrologist and that they cut her diuretic in half. Patient states the kidney specialist told her not to drink more than 40 ounces a day for one month and states she has a follow-up appointment in 1 month with the nephrologist. Patient states the kidney specialist felt that her medications might have been contributing to some of her dizziness issues. Patient reports that she is just cutting her diuretic medicine in half this week and Dr. Fletcher Anon changed two other of her medicines.  Patient states that she has been doing her exercises at home. Patient reports that she feels that her balance and dizziness is better and states she "does not seem as out of it as I have been". Patient reports that she has noticed if she sits in a car for 45 minutes or more she will be dizzy when she gets out.    Pertinent History Patient reports that she began having dizziness within the past year. Patient was admitted to the hospital due to hyponatremia and dizziness on Feb 23, 2021. Patient was seen by Dr. Tami Ribas, ENT physician and underwent VNG testing. Per Dr. Ileene Hutchinson visit note on 5/31, patient with 39% left caloric weakness noted on VNG test. Patient Patient describes her dizziness as lightheadedness and unsteadiness. Patient denies vertigo. Patient reports she is getting dizziness daily, but states it is not as severe as it was when it first began. Patient reports the dizziness lasts minutes. Patient states she notices it more if she rides an hour or more in the car and when she gets out of the car she gets more dizziness. Patient states she tries to keep her head straight ahead. Patient states when she sits for a long period of time at church she gets dizzy and lightheaded when she stands, but it eases as she moves around.  Aggravating factors include dizziness after sitting for long periods of time, looking up, quick movements and head movements. Patient reports that standing in place and staying still helps to ease her symptoms. Patient reports that she uses [employer] in the community and reports no AD use in the home, but states she does touch furniture at times for balance when ambulating about her home. Patient reports that she veers when she walks at times.    Limitations Sitting;Standing;House hold activities    Patient Stated Goals patient would like to have decreased dizziness and improved balance                [facility] PT Assessment - 06/14/21 1116       Dynamic Gait Index   Level  Surface Normal    Change in Gait Speed Normal    Gait with Horizontal Head Turns Normal    Gait with Vertical Head Turns Normal    Gait and Pivot Turn Normal   reported mild dizziness with left turning   Step Over Obstacle Normal    Step Around Obstacles Normal    Steps Mild Impairment    Total Score 23             Neuromuscular Re-education:   FUNCTIONAL OUTCOME MEASURES:  Results Comments  DHI 22/100 Low perception of handicap  ABC Scale 81% Decreased falls risk  DGI 23/24 Safe for community mobility  FOTO 48/100  in need of intervention  Discussed functional outcome test scores and compared to prior testing.  Discussed plan of care.  Discussed progress towards goals and new goals.  Lower extremity manual muscle testing: Patient with 4/5 left lower extremity hip flexion, hip abduction, hip adduction, quadriceps and hamstring muscle strength. Patient with 4/5 right lower extremity hip flexion, hip abduction, hip adduction and 5/5 right quadriceps and hamstring muscle strength.  Standing hip exercises: Patient performed with 2 pound ankle weights 1 set of 20 repetitions of hip flexion and hip abduction bilaterally at ballet bar. Patient performed 1 set of 20 repetitions with out ankle weights bilaterally hip extension at ballet bar. Patient required verbal cueing for technique. Patient reports fatigue and left lower extremity towards the end of the 20 repetitions in each of the exercises.  Patient's blood pressure was 138/55 mmHg in sitting after activity and heart rate was 58 bpm.     PT Education - 06/14/21 1058     Education Details Discussed plan of care and progress towards goals, discussed functional outcome testing and compared to prior testing    Person(s) Educated Patient    Methods Explanation    Comprehension Verbalized understanding              PT Short Term Goals - 06/15/21 1525       PT SHORT TERM GOAL #1   Title Pt will be independent with HEP  in order to improve balance and decrease dizziness symptoms in order to decrease fall risk and improve function at home and for self-management.    Time 4    Period Weeks    Status Achieved    Target Date 05/24/21               PT Long Term Goals - 06/14/21 1127       PT LONG TERM GOAL #1   Title Patient will have improved FOTO score of 5 points or greater in order to demonstrate improvements in patient's ADLs and functional performance.    Baseline Scored 49/100  on 04/26/2021; scored 48/100 on 06/14/2021    Time 8    Period Weeks    Status On-going    Target Date 08/09/21      PT LONG TERM GOAL #2   Title Patient will reduce perceived disability to low levels as indicated by <40 on Dizziness Handicap Inventory.    Baseline Scored 40/100 on 04/26/2021; scored 22/100 on 06/14/2021    Time 8    Period Weeks    Status Achieved      PT LONG TERM GOAL #3   Title Patient will reduce falls risk as indicated by Activities Specific Balance Confidence Scale (ABC) >67%.    Baseline Scored 53% on 04/26/2021; scored 81% on 06/14/2021    Time 8    Period Weeks    Status Achieved      PT LONG TERM GOAL #4   Title Patient will demonstrate reduced falls risk as evidenced by Dynamic Gait Index (DGI) 21/24 or greater.    Baseline Scored 19/24 on 04/26/2021; scored 23/24 on 06/14/2021    Time 8    Period Weeks    Status Achieved      PT LONG TERM GOAL #5   Title Patient will have improved lower extremity bilateral hip strength, quadriceps and hamstring muscles of 5/5 in order to improve patient's balance and mobility skills.    Baseline Patient reports that she feels like she stepped heavily on the left foot and that the left leg fatigues quickly with activities and that this impacts her balance and walking.    Time 8    Period Weeks    Status New    Target Date 08/09/21              Plan - 06/15/21 1530     Clinical Impression Statement Patient reports good compliance with home exercise  program and states that she has noticed improvements in her balance and with decreased dizziness.  Repeated functional outcome testing this date and compared to prior testing.  Patient improved from moderate down to low perception of handicap on the dizziness handicap inventory.  Patient improved from 53% to 81% on the ABC scale indicating decreased falls risk.  Patient improved from 19/24-23/24 on the dynamic gait index.  Patient demonstrated no significant change with her FOTO score.  Patient has met 1/1 short-term goals and 3/4 long-term goals as set on plan of care.  Patient reports that her left leg fatigues with activity and that she feels she has weakness in her legs.  Patient demonstrated for/5 muscle strength in left lower extremity hip flexion, hip Abduction, hip adduction, quadriceps and hamstring muscle strength and 4/5 right lower extremity hip flexion, abduction and adduction strength.  Added additional lower extremity strength goal to long-term goals.  Patient would like to continue vestibular PT services to try to further address balance, strength and dizziness symptoms.  Patient would benefit from continued PT services to try to further address goals and functional deficits.    Personal Factors and Comorbidities Comorbidity 3+    Comorbidities hypertension, hypothyroidism, hypercholseterolemia    Stability/Clinical Decision Making Evolving/Moderate complexity    Rehab Potential Good    PT Frequency 1x / week    PT Duration 8 weeks    PT Treatment/Interventions Canalith Repostioning;Gait training;Stair training;Therapeutic exercise;Balance training;Neuromuscular re-education;Patient/family education;Vestibular    PT Next Visit Plan review VOR X 1, firm and foam FT and ST with head turns, try body wall rolls    PT Home Exercise Plan  VOR X 1 horizontal in sitting 1 minute reps and added feet together and semi-tandem progressions on firm surface with horizontal and vertical head turns via  MedBridge; MedBridge:  Access Code: [email or web address]  URL: [email or web address]    Consulted and Agree with Plan of Care Patient                    Patient will benefit from skilled therapeutic intervention in order to improve the following deficits and impairments:     Visit Diagnosis: Dizziness and giddiness  Unsteadiness on feet     Problem List Patient Active Problem List   Diagnosis Date Noted   Aortic atherosclerosis (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 [place], [place], [address] Phone: [phone]   Fax:  [phone]  Name: Blakelynn Scheeler MRN: [id number] Date of Birth: October 22, 1941

## 2021-06-21 ENCOUNTER — Encounter: Payer: Self-pay | Admitting: Physical Therapy

## 2021-06-21 ENCOUNTER — Other Ambulatory Visit: Payer: Self-pay

## 2021-06-21 ENCOUNTER — Ambulatory Visit: Payer: Medicare Other | Admitting: Physical Therapy

## 2021-06-21 DIAGNOSIS — R42 Dizziness and giddiness: Secondary | ICD-10-CM

## 2021-06-21 DIAGNOSIS — R2681 Unsteadiness on feet: Secondary | ICD-10-CM

## 2021-06-21 NOTE — Therapy (Signed)
[place] MAIN [facility] SERVICES [address] [place], [place], [address] Phone: [phone]   Fax:  [phone]  Physical Therapy Treatment  Patient Details  Name: Crystal Lynch MRN: [id number] Date of Birth: 1941/03/29 Referring Provider (PT): Dr. Tami Ribas   Encounter Date: 06/21/2021   PT End of Session - 06/21/21 1030     Visit Number 7    Number of Visits 17    Date for PT Re-Evaluation 08/09/21    PT Start Time 1030    Equipment Utilized During Treatment Gait belt    Activity Tolerance Patient tolerated treatment well    Behavior During Therapy [facility] for tasks assessed/performed             Past Medical History:  Diagnosis Date   Arthritis    Chicken pox    Endometriosis    Esophagitis    s/p esophageal dilatation   GERD (gastroesophageal reflux disease)    History of ovarian cyst    Hypercholesterolemia    Hypertension    Hypothyroidism     Past Surgical History:  Procedure Laterality Date   ABDOMINAL SURGERY     found to have an ovarian cyst and endometriosis   APPENDECTOMY      There were no vitals filed for this visit.   Subjective Assessment - 06/21/21 1030     Subjective Patient states that she has been doing her home exercise program.  Patient states that her left legs feel a little weak especially her left leg.   Pertinent History Patient reports that she began having dizziness within the past year. Patient was admitted to the hospital due to hyponatremia and dizziness on Feb 23, 2021. Patient was seen by Dr. Tami Ribas, ENT physician and underwent VNG testing. Per Dr. Ileene Hutchinson visit note on 5/31, patient with 39% left caloric weakness noted on VNG test. Patient Patient describes her dizziness as lightheadedness and unsteadiness. Patient denies vertigo. Patient reports she is getting dizziness daily, but states it is not as severe as it was when it first began. Patient reports the dizziness lasts minutes. Patient  states she notices it more if she rides an hour or more in the car and when she gets out of the car she gets more dizziness. Patient states she tries to keep her head straight ahead. Patient states when she sits for a long period of time at church she gets dizzy and lightheaded when she stands, but it eases as she moves around. Aggravating factors include dizziness after sitting for long periods of time, looking up, quick movements and head movements. Patient reports that standing in place and staying still helps to ease her symptoms. Patient reports that she uses [employer] in the community and reports no AD use in the home, but states she does touch furniture at times for balance when ambulating about her home. Patient reports that she veers when she walks at times.    Limitations Sitting;Standing;House hold activities    Patient Stated Goals patient would like to have decreased dizziness and improved balance             Neuromuscular Re-education:  Biodex Tower Cable System: Performed Biodex tower cable system machine walking forward 8 reps and perform 6 reps each of sidestepping to the left and to the right with 7.5  pounds resistance.  Patient required contact-guard to min assistance secondary to small losses of balance especially with walking backwards and sidestepping towards the tower.  Provided cueing for technique  with exercise.  Theraband exercises: Patient performed in sitting 10 reps bilateral isolated hip abduction and hip adduction exercise with green Theraband.  Performed several reps of seated leg extension with green Thera-Band. Issued for HEP hip abduction and leg extension and handout provided via [place].       PT Education - 06/21/21 1030     Education Details ed    Person(s) Educated Patient    Methods Explanation    Comprehension Verbalized understanding              PT Short Term Goals - 06/15/21 1525       PT SHORT TERM GOAL #1   Title Pt will be  independent with HEP in order to improve balance and decrease dizziness symptoms in order to decrease fall risk and improve function at home and for self-management.    Time 4    Period Weeks    Status Achieved    Target Date 05/24/21               PT Long Term Goals - 06/14/21 1127       PT LONG TERM GOAL #1   Title Patient will have improved FOTO score of 5 points or greater in order to demonstrate improvements in patient's ADLs and functional performance.    Baseline Scored 49/100 on 04/26/2021; scored 48/100 on 06/14/2021    Time 8    Period Weeks    Status On-going    Target Date 08/09/21      PT LONG TERM GOAL #2   Title Patient will reduce perceived disability to low levels as indicated by <40 on Dizziness Handicap Inventory.    Baseline Scored 40/100 on 04/26/2021; scored 22/100 on 06/14/2021    Time 8    Period Weeks    Status Achieved      PT LONG TERM GOAL #3   Title Patient will reduce falls risk as indicated by Activities Specific Balance Confidence Scale (ABC) >67%.    Baseline Scored 53% on 04/26/2021; scored 81% on 06/14/2021    Time 8    Period Weeks    Status Achieved      PT LONG TERM GOAL #4   Title Patient will demonstrate reduced falls risk as evidenced by Dynamic Gait Index (DGI) 21/24 or greater.    Baseline Scored 19/24 on 04/26/2021; scored 23/24 on 06/14/2021    Time 8    Period Weeks    Status Achieved      PT LONG TERM GOAL #5   Title Patient will have improved lower extremity bilateral hip strength, quadriceps and hamstring muscles of 5/5 in order to improve patient's balance and mobility skills.    Baseline Patient reports that she feels like she stepped heavily on the left foot and that the left leg fatigues quickly with activities and that this impacts her balance and walking.    Time 8    Period Weeks    Status New    Target Date 08/09/21                    Patient will benefit from skilled therapeutic intervention in order to  improve the following deficits and impairments:     Visit Diagnosis: Unsteadiness on feet  Dizziness and giddiness     Problem List Patient Active Problem List   Diagnosis Date Noted   Aortic atherosclerosis ([place]) 04/17/2021   Right acute serous otitis media    Hyponatremia 02/23/2021   Prolonged  QT interval 02/23/2021   Dizziness 09/12/2020   Fall 03/08/2020   Fatty liver 12/28/2019   Chest pain 07/25/2019   Frequent urinary tract infections 12/24/2018   Acute cystitis without hematuria 10/11/2018   Hypercalcemia 01/09/2017   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 [place], [place], [address] Phone: [phone]   Fax:  [phone]  Name: Dewanna Porges MRN: [id number] Date of Birth: 08/09/1941

## 2021-06-22 ENCOUNTER — Telehealth: Payer: Self-pay | Admitting: Internal Medicine

## 2021-06-22 MED ORDER — OMEPRAZOLE 20 MG PO CPDR: 20.0000 mg | DELAYED_RELEASE_CAPSULE | Freq: Every day | ORAL | 3 refills | Status: DC

## 2021-06-22 NOTE — Telephone Encounter (Signed)
Pt wants this sent to [facility]

## 2021-06-22 NOTE — Addendum Note (Signed)
Addended byElpidio Galea T on: 06/22/2021 04:38 PM   Modules accepted: Orders

## 2021-06-22 NOTE — Telephone Encounter (Signed)
[facility] [address] said that [facility] insurance would only pay for 90 days the entire year unless her provider said other wise. Patient needs her omeprazole (PRILOSEC) 20 MG capsule refilled.

## 2021-06-22 NOTE — Telephone Encounter (Signed)
Years worth has been refilled.

## 2021-06-28 ENCOUNTER — Telehealth: Payer: Self-pay | Admitting: Internal Medicine

## 2021-06-28 ENCOUNTER — Other Ambulatory Visit: Payer: Self-pay

## 2021-06-28 ENCOUNTER — Ambulatory Visit: Payer: Medicare Other | Attending: Unknown Physician Specialty | Admitting: Physical Therapy

## 2021-06-28 DIAGNOSIS — R42 Dizziness and giddiness: Secondary | ICD-10-CM

## 2021-06-28 DIAGNOSIS — R2681 Unsteadiness on feet: Secondary | ICD-10-CM

## 2021-06-28 NOTE — Telephone Encounter (Signed)
Patient came into office. She needs a refill on her omeprazole (PRILOSEC) 20 MG capsule. [facility] said that [facility] will only cover a 90 day per year, unless her provider said she has to have it daily. She went to pick up medication and it is over $100. [facility] [address] in [place]. She can not afford $100.

## 2021-06-29 MED ORDER — OMEPRAZOLE 20 MG PO CPDR: 20.0000 mg | DELAYED_RELEASE_CAPSULE | Freq: Every day | ORAL | 3 refills | Status: DC

## 2021-06-29 NOTE — Telephone Encounter (Signed)
In reviewing the chart, you sent in a prescription for prilosec '20mg'$  #90 with 3 refills on 06/22/21 to mail order pharmacy.  I resent the prescription for prilosec '20mg'$  #90 with 3 refills to her local pharmacy as requested. Notify her to let us know if she continues to have a problem.  I am not sure why insurance did not cover previously.

## 2021-06-29 NOTE — Telephone Encounter (Signed)
Pharmacy is needing omeprazole (PRILOSEC) 20 MG capsule to be #90 with 3 refills so insurance will cover. Ok to send in?

## 2021-06-29 NOTE — Telephone Encounter (Signed)
In reviewing the chart it appears prilosec was sent in '20mg'$  q day #30 with 3 refills - which is what she is asking for.  Please clarify with insurance company or pharmacy what is needed.

## 2021-06-29 NOTE — Addendum Note (Signed)
Addended by: Alisa Graff on: 06/29/2021 09:03 PM   Modules accepted: Orders

## 2021-06-29 NOTE — Telephone Encounter (Signed)
[facility] [address] bc she is out of medication and wants it asap

## 2021-06-30 NOTE — Telephone Encounter (Signed)
Patient has been notified

## 2021-07-02 ENCOUNTER — Encounter: Payer: Self-pay | Admitting: Physical Therapy

## 2021-07-02 NOTE — Therapy (Signed)
[place] MAIN [facility] SERVICES [address] [place], [place], [address] Phone: [phone]   Fax:  [phone]  Physical Therapy Treatment  Patient Details  Name: Crystal Lynch MRN: [id number] Date of Birth: [birth year] Referring Provider (PT): Dr. Tami Ribas   Encounter Date: 06/28/2021   PT End of Session - 07/02/21 1536     Visit Number 8    Number of Visits 17    Date for PT Re-Evaluation 08/09/21    PT Start Time 1040    PT Stop Time 1125    PT Time Calculation (min) 45 min    Equipment Utilized During Treatment Gait belt    Activity Tolerance Patient tolerated treatment well    Behavior During Therapy WFL for tasks assessed/performed             Past Medical History:  Diagnosis Date   Arthritis    Chicken pox    Endometriosis    Esophagitis    s/p esophageal dilatation   GERD (gastroesophageal reflux disease)    History of ovarian cyst    Hypercholesterolemia    Hypertension    Hypothyroidism     Past Surgical History:  Procedure Laterality Date   ABDOMINAL SURGERY     found to have an ovarian cyst and endometriosis   APPENDECTOMY      There were no vitals filed for this visit.   Subjective Assessment - 07/02/21 1535     Subjective Patient states that the Biodex tower exercise last visit caused her bursitis on the left to flare up for several days.  Patient reports that she was able to attend the [place] football game over the weekend and that she was pleased that she was able to attend without dizziness or imbalance.    Pertinent History Patient reports that she began having dizziness within the past year. Patient was admitted to the hospital due to hyponatremia and dizziness on Feb 23, 2021. Patient was seen by Dr. Tami Ribas, ENT physician and underwent VNG testing. Per Dr. Ileene Hutchinson visit note on 5/31, patient with 39% left caloric weakness noted on VNG test. Patient Patient describes her dizziness as lightheadedness and  unsteadiness. Patient denies vertigo. Patient reports she is getting dizziness daily, but states it is not as severe as it was when it first began. Patient reports the dizziness lasts minutes. Patient states she notices it more if she rides an hour or more in the car and when she gets out of the car she gets more dizziness. Patient states she tries to keep her head straight ahead. Patient states when she sits for a long period of time at church she gets dizzy and lightheaded when she stands, but it eases as she moves around. Aggravating factors include dizziness after sitting for long periods of time, looking up, quick movements and head movements. Patient reports that standing in place and staying still helps to ease her symptoms. Patient reports that she uses [employer] in the community and reports no AD use in the home, but states she does touch furniture at times for balance when ambulating about her home. Patient reports that she veers when she walks at times.    Limitations Sitting;Standing;House hold activities    Patient Stated Goals patient would like to have decreased dizziness and improved balance            Therapeutic Exercise:  Leg Press: Gust and demonstrated how to perform leg press and calf raise exercises. Patient performed leg press  machine #40 pounds 1 set of 15 reps with verbal cue to not lock knees in extension and for technique.  Patient reports fatigue in left leg with this activity.  Patient able to perform 3 sets of 2 reps of 55 pounds. Patient performed 20 reps of calf raises with 55 pounds.  Standing leg exercises: Patient performed with 3 pound ankle weights bilaterally 2 sets of 15 reps knee raises with 3 to 5-second holds each leg.  Then, performed 2 sets of 15 reps hamstring curls with verbal cues for technique.  Mini-squats: Patient performed 2 sets of 15 reps mini-squats with 5 second holds with verbal cues to maintain upright posture patient tended to lean  backwards.    PT Education - 07/02/21 1536     Education Details Exercise technique throughout session    Person(s) Educated Patient    Methods Explanation    Comprehension Verbalized understanding              PT Short Term Goals - 06/15/21 1525       PT SHORT TERM GOAL #1   Title Pt will be independent with HEP in order to improve balance and decrease dizziness symptoms in order to decrease fall risk and improve function at home and for self-management.    Time 4    Period Weeks    Status Achieved    Target Date 05/24/21               PT Long Term Goals - 06/14/21 1127       PT LONG TERM GOAL #1   Title Patient will have improved FOTO score of 5 points or greater in order to demonstrate improvements in patient's ADLs and functional performance.    Baseline Scored 49/100 on 04/26/2021; scored 48/100 on 06/14/2021    Time 8    Period Weeks    Status On-going    Target Date 08/09/21      PT LONG TERM GOAL #2   Title Patient will reduce perceived disability to low levels as indicated by <40 on Dizziness Handicap Inventory.    Baseline Scored 40/100 on 04/26/2021; scored 22/100 on 06/14/2021    Time 8    Period Weeks    Status Achieved      PT LONG TERM GOAL #3   Title Patient will reduce falls risk as indicated by Activities Specific Balance Confidence Scale (ABC) >67%.    Baseline Scored 53% on 04/26/2021; scored 81% on 06/14/2021    Time 8    Period Weeks    Status Achieved      PT LONG TERM GOAL #4   Title Patient will demonstrate reduced falls risk as evidenced by Dynamic Gait Index (DGI) 21/24 or greater.    Baseline Scored 19/24 on 04/26/2021; scored 23/24 on 06/14/2021    Time 8    Period Weeks    Status Achieved      PT LONG TERM GOAL #5   Title Patient will have improved lower extremity bilateral hip strength, quadriceps and hamstring muscles of 5/5 in order to improve patient's balance and mobility skills.    Baseline Patient reports that she feels like  she stepped heavily on the left foot and that the left leg fatigues quickly with activities and that this impacts her balance and walking.    Time 8    Period Weeks    Status New    Target Date 08/09/21  Plan - 07/02/21 1542     Clinical Impression Statement Reports that she has continued to do her vestibular exercises and that she was able to attend the [place] football game this past weekend without dizziness or imbalance.  Patient states that her left hip bursitis feels flared up after last session therefore deferred hip abduction activities this date.  Patient was challenged by leg press activities and had to decrease weight in order for patient to be able to perform bilateral leg press.  Patient's 2 rep max was 55 pounds.  We will continue to work on leg press activities next session and will consider single-leg press on left leg in addition to bilateral leg press.  Patient challenged by standing hip and leg exercises at ballet bar this date as well.  Patient would benefit from continued lower extremity strengthening exercises as tolerated.  Encourage patient to follow-up as indicated.    Personal Factors and Comorbidities Comorbidity 3+    Comorbidities hypertension, hypothyroidism, hypercholseterolemia    Stability/Clinical Decision Making Evolving/Moderate complexity    Rehab Potential Good    PT Frequency 1x / week    PT Duration 8 weeks    PT Treatment/Interventions Canalith Repostioning;Gait training;Stair training;Therapeutic exercise;Balance training;Neuromuscular re-education;Patient/family education;Vestibular    PT Next Visit Plan review VOR X 1, firm and foam FT and ST with head turns, try body wall rolls    PT Home Exercise Plan VOR X 1 horizontal in sitting 1 minute reps and added feet together and semi-tandem progressions on firm surface with horizontal and vertical head turns via MedBridge; MedBridge:  Access Code: [email or web address]  URL:  [email or web address]    Consulted and Agree with Plan of Care Patient             Patient will benefit from skilled therapeutic intervention in order to improve the following deficits and impairments:  Dizziness, Decreased balance, Difficulty walking  Visit Diagnosis: Unsteadiness on feet  Dizziness and giddiness     Problem List Patient Active Problem List   Diagnosis Date Noted   Aortic atherosclerosis ([place]) 04/17/2021   Right acute serous otitis media    Hyponatremia 02/23/2021   Prolonged QT interval 02/23/2021   Dizziness 09/12/2020   Fall 03/08/2020   Fatty liver 12/28/2019   Chest pain 07/25/2019   Frequent urinary tract infections 12/24/2018   Acute cystitis without hematuria 10/11/2018   Hypercalcemia 01/09/2017   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 [place], [place], [address] Phone: [phone]   Fax:  [phone]  Name: Amaka Mandal MRN: [id number] Date of Birth: [birth year]

## 2021-07-05 ENCOUNTER — Other Ambulatory Visit: Payer: Self-pay

## 2021-07-05 ENCOUNTER — Ambulatory Visit: Payer: Medicare Other

## 2021-07-05 DIAGNOSIS — R42 Dizziness and giddiness: Secondary | ICD-10-CM

## 2021-07-05 DIAGNOSIS — R2681 Unsteadiness on feet: Secondary | ICD-10-CM | POA: Diagnosis not present

## 2021-07-05 NOTE — Therapy (Signed)
[place] MAIN [facility] SERVICES [address] [place], [place], [address] Phone: [phone]   Fax:  [phone]  Physical Therapy Treatment  Patient Details  Name: Crystal Lynch MRN: [id number] Date of Birth: [birth year] Referring Provider (PT): Dr. Tami Ribas   Encounter Date: 07/05/2021   PT End of Session - 07/05/21 1113     Visit Number 9    Number of Visits 17    Date for PT Re-Evaluation 08/09/21    PT Start Time 1114    PT Stop Time 1159    PT Time Calculation (min) 45 min    Equipment Utilized During Treatment Gait belt    Activity Tolerance Patient tolerated treatment well    Behavior During Therapy WFL for tasks assessed/performed             Past Medical History:  Diagnosis Date   Arthritis    Chicken pox    Endometriosis    Esophagitis    s/p esophageal dilatation   GERD (gastroesophageal reflux disease)    History of ovarian cyst    Hypercholesterolemia    Hypertension    Hypothyroidism     Past Surgical History:  Procedure Laterality Date   ABDOMINAL SURGERY     found to have an ovarian cyst and endometriosis   APPENDECTOMY      There were no vitals filed for this visit.   Subjective Assessment - 07/05/21 1117     Subjective Patient reports her hip pain continues to be bothering her from the bursititis. Reports no vestibular issues since last session.    Pertinent History Patient reports that she began having dizziness within the past year. Patient was admitted to the hospital due to hyponatremia and dizziness on Feb 23, 2021. Patient was seen by Dr. Tami Ribas, ENT physician and underwent VNG testing. Per Dr. Ileene Hutchinson visit note on 5/31, patient with 39% left caloric weakness noted on VNG test. Patient Patient describes her dizziness as lightheadedness and unsteadiness. Patient denies vertigo. Patient reports she is getting dizziness daily, but states it is not as severe as it was when it first began. Patient  reports the dizziness lasts minutes. Patient states she notices it more if she rides an hour or more in the car and when she gets out of the car she gets more dizziness. Patient states she tries to keep her head straight ahead. Patient states when she sits for a long period of time at church she gets dizzy and lightheaded when she stands, but it eases as she moves around. Aggravating factors include dizziness after sitting for long periods of time, looking up, quick movements and head movements. Patient reports that standing in place and staying still helps to ease her symptoms. Patient reports that she uses [employer] in the community and reports no AD use in the home, but states she does touch furniture at times for balance when ambulating about her home. Patient reports that she veers when she walks at times.    Limitations Sitting;Standing;House hold activities    Patient Stated Goals patient would like to have decreased dizziness and improved balance    Currently in Pain? Yes    Pain Score 4     Pain Location Hip    Pain Orientation Left    Pain Descriptors / Indicators Aching    Pain Type Chronic pain               Patient reports her hip pain continues to be bothering  her from the bursititis. Reports no vestibular issues since last session.   BP at start of session 147/57  Therapeutic Exercise:  Sit to stand with PVC press 10x ; cues for core activation  Forward step over hurdle and back SUE support 10x each LE, cue for reduction of circumduction BUE support on bar: squat with max cueing for sequencing 10x Standing heel raise 15x with BUE support   Standing with # 3lb ankle weight: CGA for stability  -Hip extension with BUE upper extremity support, cueing for neutral hip alignment, upright posture for optimal muscle recruitment, and sequencing, 15x each LE,  -Hip abduction with BUE upper extremity support, cueing for neutral foot alignment for correct muscle activation, 15x each  LE -Hip flexion with BUE upper extremity support, cueing for body mechanics, speed of muscle recruitment for optimal strengthening and stabilization 15x each LE -Hamstring curl with BUE upper extremity support, cueing for knee alignment for recruitment of hamstring musculature, 15x each LE   Seated with # 3lb ankle weights  -Seated marches with upright posture, back away from back of chair for abdominal/trunk activation/stabilization, 10x each LE -Seated LAQ with 3 second holds, 10x each LE, cueing for muscle activation and sequencing for neutral alignment -Seated IR/ER with cueing for stabilizing knee placement with lateral foot movement for optimal muscle recruitment, 10x each LE -heel raise 15x    Seated: GTB hamstring curl 15x each LE  Lateral step over hedgehog 12x each LE   Pt educated throughout session about proper posture and technique with exercises. Improved exercise technique, movement at target joints, use of target muscles after min to mod verbal, visual, tactile cues.   Patient presents to physical therapy with excellent motivation. LLE continues to require focused strengthening and stabilization in pain free interventions. Intermittent L hip pain noted requiring modification of intervention in standing. Pain is not in weightbearing however. Patient would benefit from continued lower extremity strengthening exercises as tolerated. Encourage patient to follow-up as indicated.                    PT Education - 07/05/21 1112     Education Details exercise technique, body mechanics    Person(s) Educated Patient    Methods Explanation;Demonstration;Tactile cues;Verbal cues    Comprehension Verbalized understanding;Returned demonstration;Verbal cues required;Tactile cues required              PT Short Term Goals - 06/15/21 1525       PT SHORT TERM GOAL #1   Title Pt will be independent with HEP in order to improve balance and decrease dizziness symptoms  in order to decrease fall risk and improve function at home and for self-management.    Time 4    Period Weeks    Status Achieved    Target Date 05/24/21               PT Long Term Goals - 06/14/21 1127       PT LONG TERM GOAL #1   Title Patient will have improved FOTO score of 5 points or greater in order to demonstrate improvements in patient's ADLs and functional performance.    Baseline Scored 49/100 on 04/26/2021; scored 48/100 on 06/14/2021    Time 8    Period Weeks    Status On-going    Target Date 08/09/21      PT LONG TERM GOAL #2   Title Patient will reduce perceived disability to low levels as indicated by <40 on Dizziness  Handicap Inventory.    Baseline Scored 40/100 on 04/26/2021; scored 22/100 on 06/14/2021    Time 8    Period Weeks    Status Achieved      PT LONG TERM GOAL #3   Title Patient will reduce falls risk as indicated by Activities Specific Balance Confidence Scale (ABC) >67%.    Baseline Scored 53% on 04/26/2021; scored 81% on 06/14/2021    Time 8    Period Weeks    Status Achieved      PT LONG TERM GOAL #4   Title Patient will demonstrate reduced falls risk as evidenced by Dynamic Gait Index (DGI) 21/24 or greater.    Baseline Scored 19/24 on 04/26/2021; scored 23/24 on 06/14/2021    Time 8    Period Weeks    Status Achieved      PT LONG TERM GOAL #5   Title Patient will have improved lower extremity bilateral hip strength, quadriceps and hamstring muscles of 5/5 in order to improve patient's balance and mobility skills.    Baseline Patient reports that she feels like she stepped heavily on the left foot and that the left leg fatigues quickly with activities and that this impacts her balance and walking.    Time 8    Period Weeks    Status New    Target Date 08/09/21                   Plan - 07/05/21 1237     Clinical Impression Statement Patient presents to physical therapy with excellent motivation. LLE continues to require focused  strengthening and stabilization in pain free interventions. Intermittent L hip pain noted requiring modification of intervention in standing. Pain is not in weightbearing however. Patient would benefit from continued lower extremity strengthening exercises as tolerated. Encourage patient to follow-up as indicated.    Personal Factors and Comorbidities Comorbidity 3+    Comorbidities hypertension, hypothyroidism, hypercholseterolemia    Stability/Clinical Decision Making Evolving/Moderate complexity    Rehab Potential Good    PT Frequency 1x / week    PT Duration 8 weeks    PT Treatment/Interventions Canalith Repostioning;Gait training;Stair training;Therapeutic exercise;Balance training;Neuromuscular re-education;Patient/family education;Vestibular    PT Next Visit Plan review VOR X 1, firm and foam FT and ST with head turns, try body wall rolls    PT Home Exercise Plan VOR X 1 horizontal in sitting 1 minute reps and added feet together and semi-tandem progressions on firm surface with horizontal and vertical head turns via MedBridge; MedBridge:  Access Code: [email or web address]  URL: [email or web address]    Consulted and Agree with Plan of Care Patient             Patient will benefit from skilled therapeutic intervention in order to improve the following deficits and impairments:  Dizziness, Decreased balance, Difficulty walking  Visit Diagnosis: Unsteadiness on feet  Dizziness and giddiness     Problem List Patient Active Problem List   Diagnosis Date Noted   Aortic atherosclerosis ([place]) 04/17/2021   Right acute serous otitis media    Hyponatremia 02/23/2021   Prolonged QT interval 02/23/2021   Dizziness 09/12/2020   Fall 03/08/2020   Fatty liver 12/28/2019   Chest pain 07/25/2019   Frequent urinary tract infections 12/24/2018   Acute cystitis without hematuria 10/11/2018   Hypercalcemia 01/09/2017   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 SERVICES [address] [place], [place], [address] Phone: [phone]   Fax:  [phone]  Name: Crystal Lynch MRN: [id number] Date of Birth: [birth year]

## 2021-07-12 ENCOUNTER — Ambulatory Visit: Payer: Medicare Other | Admitting: Physical Therapy

## 2021-07-12 ENCOUNTER — Encounter: Payer: Self-pay | Admitting: Physical Therapy

## 2021-07-12 ENCOUNTER — Other Ambulatory Visit: Payer: Self-pay

## 2021-07-12 DIAGNOSIS — R2681 Unsteadiness on feet: Secondary | ICD-10-CM

## 2021-07-12 DIAGNOSIS — R42 Dizziness and giddiness: Secondary | ICD-10-CM

## 2021-07-12 NOTE — Therapy (Signed)
[place] MAIN [facility] SERVICES [address] [place], [place], [address] Phone: [phone]   Fax:  [phone]  Physical Therapy Treatment  Patient Details  Name: Crystal Lynch MRN: [id number] Date of Birth: 07/29/1941 Referring Provider (PT): Dr. Tami Ribas   Encounter Date: 07/12/2021   PT End of Session - 07/12/21 0947     Visit Number 10    Number of Visits 17    Date for PT Re-Evaluation 08/09/21    PT Start Time 0946    PT Stop Time 1035    PT Time Calculation (min) 49 min    Equipment Utilized During Treatment Gait belt    Activity Tolerance Patient tolerated treatment well    Behavior During Therapy WFL for tasks assessed/performed             Past Medical History:  Diagnosis Date   Arthritis    Chicken pox    Endometriosis    Esophagitis    s/p esophageal dilatation   GERD (gastroesophageal reflux disease)    History of ovarian cyst    Hypercholesterolemia    Hypertension    Hypothyroidism     Past Surgical History:  Procedure Laterality Date   ABDOMINAL SURGERY     found to have an ovarian cyst and endometriosis   APPENDECTOMY      There were no vitals filed for this visit.   Subjective Assessment - 07/12/21 0948     Subjective Patient states she had a UTI last week with fever and chills. Patient said last Friday she went to acute care clinic and she was put on antibiotics. Patient states she is starting to feel better, states that she feels a little weak after having had the UTI. Patient states that her hip is feeling better. Patient states that she has had no further episodes of dizziness since last seen in clinic.    Pertinent History Patient reports that she began having dizziness within the past year. Patient was admitted to the hospital due to hyponatremia and dizziness on Feb 23, 2021. Patient was seen by Dr. Tami Ribas, ENT physician and underwent VNG testing. Per Dr. Ileene Hutchinson visit note on 5/31, patient  with 39% left caloric weakness noted on VNG test. Patient Patient describes her dizziness as lightheadedness and unsteadiness. Patient denies vertigo. Patient reports she is getting dizziness daily, but states it is not as severe as it was when it first began. Patient reports the dizziness lasts minutes. Patient states she notices it more if she rides an hour or more in the car and when she gets out of the car she gets more dizziness. Patient states she tries to keep her head straight ahead. Patient states when she sits for a long period of time at church she gets dizzy and lightheaded when she stands, but it eases as she moves around. Aggravating factors include dizziness after sitting for long periods of time, looking up, quick movements and head movements. Patient reports that standing in place and staying still helps to ease her symptoms. Patient reports that she uses [employer] in the community and reports no AD use in the home, but states she does touch furniture at times for balance when ambulating about her home. Patient reports that she veers when she walks at times.    Limitations Sitting;Standing;House hold activities    Patient Stated Goals patient would like to have decreased dizziness and improved balance            Therapeutic  Exercise:  Sit to Stands:  Patient educated as to proper form with sit to stand. Performed 1 set of 5 reps and 1 set of 10 reps from armless chair with verbal cueing for sequencing and to control eccentric descent with contact-guard assist. Patient has difficulty with controlling eccentric descent and struggled with last few reps secondary to leg fatigue. Added sit to stands to home exercise program via med bridge and handout provided.  Leg Press: Patient performed leg press machine #55 pounds 2 sets of 10 reps with verbal cue to not lock knees in extension and to control throughout. Patient reports fatigue in quads with this activity.  Patient able to keep knees  separated and in good alignment throughout exercise this date.  Heel raises: Patient performed at parallel bars heel raises 20 reps 1 set with supervision and verbal cues for technique.  Standing Hip exercises: Patient performed in // bars holding with bilateral UEs hip flexion and hip Abduction with #4 ankle weights 2 sets of 20 repetitions.  Patient required verbal cueing for technique.   Access Code: [email or web address] URL: [email or web address] Date: 07/12/2021 Prepared by: Lady Deutscher  Exercises Seated VOR X1 exercise - 3 x daily - 7 x weekly - 1 sets - 3 reps - 1 minute hold Feet Together with Vertical Head turns - 1 x daily - 7 x weekly - 1 sets - 5 reps - 1 minute hold Feet Together Balance with Head Rotation - 1 x daily - 7 x weekly - 1 sets - 5 reps - 1 minute hold Half Tandem Stance Balance with Head Rotation - 1 x daily - 7 x weekly - 1 sets - 5 reps - 1 minute hold Seated Single Leg Hip Abduction with Resistance - 1 x daily - 7 x weekly - 1 sets - 10 reps - 3 to 5 seconds hold Seated Leg Press with Resistance - 1 x daily - 7 x weekly - 1 sets - 10 reps Sit to Stand Without Arm Support - 1 x daily - 7 x weekly - 2 sets - 10 reps    PT Education - 07/12/21 1056     Education Details Added sit to stand to home exercise program via med bridge and reviewed home exercise program, exercise technique throughout session    Person(s) Educated Patient    Methods Explanation;Demonstration;Verbal cues;Handout    Comprehension Verbalized understanding;Returned demonstration              PT Short Term Goals - 06/15/21 1525       PT SHORT TERM GOAL #1   Title Pt will be independent with HEP in order to improve balance and decrease dizziness symptoms in order to decrease fall risk and improve function at home and for self-management.    Time 4    Period Weeks    Status Achieved    Target Date 05/24/21               PT Long Term Goals - 06/14/21 1127        PT LONG TERM GOAL #1   Title Patient will have improved FOTO score of 5 points or greater in order to demonstrate improvements in patient's ADLs and functional performance.    Baseline Scored 49/100 on 04/26/2021; scored 48/100 on 06/14/2021    Time 8    Period Weeks    Status On-going    Target Date 08/09/21      PT LONG TERM GOAL #2   Title Patient  will reduce perceived disability to low levels as indicated by <40 on Dizziness Handicap Inventory.    Baseline Scored 40/100 on 04/26/2021; scored 22/100 on 06/14/2021    Time 8    Period Weeks    Status Achieved      PT LONG TERM GOAL #3   Title Patient will reduce falls risk as indicated by Activities Specific Balance Confidence Scale (ABC) >67%.    Baseline Scored 53% on 04/26/2021; scored 81% on 06/14/2021    Time 8    Period Weeks    Status Achieved      PT LONG TERM GOAL #4   Title Patient will demonstrate reduced falls risk as evidenced by Dynamic Gait Index (DGI) 21/24 or greater.    Baseline Scored 19/24 on 04/26/2021; scored 23/24 on 06/14/2021    Time 8    Period Weeks    Status Achieved      PT LONG TERM GOAL #5   Title Patient will have improved lower extremity bilateral hip strength, quadriceps and hamstring muscles of 5/5 in order to improve patient's balance and mobility skills.    Baseline Patient reports that she feels like she stepped heavily on the left foot and that the left leg fatigues quickly with activities and that this impacts her balance and walking.    Time 8    Period Weeks    Status New    Target Date 08/09/21                   Plan - 07/12/21 0954     Clinical Impression Statement Reports that she is continue to do home exercise program and that semitandem progressions are still challenging.  Patient able to progress this date from 3 pound to 4 pound ankle weights with standing hip abduction and hip flexion exercises.  In addition patient able to progress to 55 pounds with bilateral leg press 2 sets  of 10 reps this date.  Patient had difficulty with controlling eccentric descent with sit to stand exercise in addition had difficulty with quad strength with standing after a few repetitions.  Added sit to stand for home exercise program.  Patient gaining lower extremity muscle strength as evidenced by ability to increase weights with a variety of activities this date and patient would benefit from continued PT services to further address balance and lower extremity strengthening deficits.    Personal Factors and Comorbidities Comorbidity 3+    Comorbidities hypertension, hypothyroidism, hypercholseterolemia    Stability/Clinical Decision Making Evolving/Moderate complexity    Rehab Potential Good    PT Frequency 1x / week    PT Duration 8 weeks    PT Treatment/Interventions Canalith Repostioning;Gait training;Stair training;Therapeutic exercise;Balance training;Neuromuscular re-education;Patient/family education;Vestibular    PT Next Visit Plan review VOR X 1, firm and foam FT and ST with head turns, try body wall rolls    PT Home Exercise Plan VOR X 1 horizontal in sitting 1 minute reps and added feet together and semi-tandem progressions on firm surface with horizontal and vertical head turns via MedBridge; MedBridge:  Access Code: [email or web address]  URL: [email or web address]    Consulted and Agree with Plan of Care Patient             Patient will benefit from skilled therapeutic intervention in order to improve the following deficits and impairments:  Dizziness, Decreased balance, Difficulty walking  Visit Diagnosis: Dizziness and giddiness  Unsteadiness on feet     Problem List Patient Active Problem List  Diagnosis Date Noted   Aortic atherosclerosis ([place]) 04/17/2021   Right acute serous otitis media    Hyponatremia 02/23/2021   Prolonged QT interval 02/23/2021   Dizziness 09/12/2020   Fall 03/08/2020   Fatty liver 12/28/2019   Chest pain 07/25/2019   Frequent  urinary tract infections 12/24/2018   Acute cystitis without hematuria 10/11/2018   Hypercalcemia 01/09/2017   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

## 2021-07-13 ENCOUNTER — Other Ambulatory Visit (INDEPENDENT_AMBULATORY_CARE_PROVIDER_SITE_OTHER): Payer: Medicare Other

## 2021-07-13 ENCOUNTER — Other Ambulatory Visit: Payer: Self-pay

## 2021-07-13 DIAGNOSIS — E039 Hypothyroidism, unspecified: Secondary | ICD-10-CM

## 2021-07-13 DIAGNOSIS — I10 Essential (primary) hypertension: Secondary | ICD-10-CM | POA: Diagnosis not present

## 2021-07-13 DIAGNOSIS — E7800 Pure hypercholesterolemia, unspecified: Secondary | ICD-10-CM

## 2021-07-13 LAB — LIPID PANEL
Cholesterol: 175 mg/dL (ref 0–200)
HDL: 37.4 mg/dL — ABNORMAL LOW (ref >39.00)
LDL Cholesterol: 105 mg/dL — ABNORMAL HIGH (ref 0–99)
NonHDL: 137.19
Total CHOL/HDL Ratio: 5
Triglycerides: 160 mg/dL — ABNORMAL HIGH (ref 0.0–149.0)
VLDL: 32 mg/dL (ref 0.0–40.0)

## 2021-07-13 LAB — BASIC METABOLIC PANEL
BUN: 13 mg/dL (ref 6–23)
CO2: 27 mEq/L (ref 19–32)
Calcium: 10.2 mg/dL (ref 8.4–10.5)
Chloride: 104 mEq/L (ref 96–112)
Creatinine, Ser: 0.79 mg/dL (ref 0.40–1.20)
GFR: 70.07 mL/min (ref >60.00)
Glucose, Bld: 103 mg/dL — ABNORMAL HIGH (ref 70–99)
Potassium: 3.7 mEq/L (ref 3.5–5.1)
Sodium: 139 mEq/L (ref 135–145)

## 2021-07-13 LAB — HEPATIC FUNCTION PANEL
ALT: 30 U/L (ref 0–35)
AST: 18 U/L (ref 0–37)
Albumin: 4 g/dL (ref 3.5–5.2)
Alkaline Phosphatase: 54 U/L (ref 39–117)
Bilirubin, Direct: 0.1 mg/dL (ref 0.0–0.3)
Total Bilirubin: 0.6 mg/dL (ref 0.2–1.2)
Total Protein: 6.3 g/dL (ref 6.0–8.3)

## 2021-07-13 LAB — TSH: TSH: 4.82 u[IU]/mL (ref 0.35–5.50)

## 2021-07-15 ENCOUNTER — Ambulatory Visit (INDEPENDENT_AMBULATORY_CARE_PROVIDER_SITE_OTHER): Payer: Medicare Other | Admitting: Internal Medicine

## 2021-07-15 ENCOUNTER — Other Ambulatory Visit: Payer: Self-pay

## 2021-07-15 ENCOUNTER — Encounter: Payer: Self-pay | Admitting: Internal Medicine

## 2021-07-15 VITALS — BP 122/74 | HR 70 | Temp 97.8°F | Resp 16 | Ht 62.0 in | Wt 127.8 lb

## 2021-07-15 DIAGNOSIS — I10 Essential (primary) hypertension: Secondary | ICD-10-CM | POA: Diagnosis not present

## 2021-07-15 DIAGNOSIS — I700 Atherosclerosis of aorta: Secondary | ICD-10-CM | POA: Diagnosis not present

## 2021-07-15 DIAGNOSIS — E7800 Pure hypercholesterolemia, unspecified: Secondary | ICD-10-CM | POA: Diagnosis not present

## 2021-07-15 DIAGNOSIS — R5383 Other fatigue: Secondary | ICD-10-CM

## 2021-07-15 DIAGNOSIS — E039 Hypothyroidism, unspecified: Secondary | ICD-10-CM

## 2021-07-15 DIAGNOSIS — K760 Fatty (change of) liver, not elsewhere classified: Secondary | ICD-10-CM | POA: Diagnosis not present

## 2021-07-15 DIAGNOSIS — N390 Urinary tract infection, site not specified: Secondary | ICD-10-CM

## 2021-07-15 MED ORDER — ROSUVASTATIN CALCIUM 10 MG PO TABS: 10.0000 mg | ORAL_TABLET | Freq: Every day | ORAL | 1 refills | Status: DC

## 2021-07-15 NOTE — Progress Notes (Addendum)
Patient ID: [name], female   DOB: 1940/12/12, 80 y.o.   MRN: [id number]   Subjective:    Patient ID: [name], female    DOB: 1940/11/11, 80 y.o.   MRN: [id number]  This visit occurred during the SARS-CoV-2 public health emergency.  Safety protocols were in place, including screening questions prior to the visit, additional usage of staff PPE, and extensive cleaning of exam room while observing appropriate contact time as indicated for disinfecting solutions.   Patient here for a scheduled followup.   Chief Complaint  Patient presents with   Gastroesophageal Reflux   Hyperlipidemia   .   HPI F/up regarding her blood pressure, sodium and cholesterol.  She was recently evaluated and found to have UTI.  On abx.  Feeling better.  Going to therapy for her balance issues.  Helping.  Uses a cane.  No chest pain or sob reported.  No abdominal pain or bowel change reported.  Discussed labs.    Past Medical History:  Diagnosis Date   Arthritis    Chicken pox    Endometriosis    Esophagitis    s/p esophageal dilatation   GERD (gastroesophageal reflux disease)    History of ovarian cyst    Hypercholesterolemia    Hypertension    Hypothyroidism    Past Surgical History:  Procedure Laterality Date   ABDOMINAL SURGERY     found to have an ovarian cyst and endometriosis   APPENDECTOMY     Family History  Problem Relation Age of Onset   Lung disease Father    Heart attack Father    Heart disease Mother        myocardial infarction   Breast cancer Mother 85   Heart attack Mother    Hypertension Mother    Hyperlipidemia Mother    Breast cancer Sister 65   Hypertension Sister    Rheum arthritis Sister    Breast cancer Maternal Aunt 49   Hypertension Brother    Colon cancer Neg Hx    Social History   Socioeconomic History   Marital status: Married    Spouse name: Not on file   Number of children: Not on file   Years of education: Not on file   Highest  education level: Not on file  Occupational History   Not on file  Tobacco Use   Smoking status: Never   Smokeless tobacco: Never  Vaping Use   Vaping Use: Never used  Substance and Sexual Activity   Alcohol use: No    Alcohol/week: 0.0 standard drinks   Drug use: No   Sexual activity: Never  Other Topics Concern   Not on file  Social History Narrative   Not on file   Social Determinants of Health   Financial Resource Strain: Low Risk    Difficulty of Paying Living Expenses: Not hard at all  Food Insecurity: No Food Insecurity   Worried About Charity fundraiser in the Last Year: Never true   Ran Out of Food in the Last Year: Never true  Transportation Needs: No Transportation Needs   Lack of Transportation (Medical): No   Lack of Transportation (Non-Medical): No  Physical Activity: Sufficiently Active   Days of Exercise per Week: 5 days   Minutes of Exercise per Session: 30 min  Stress: No Stress Concern Present   Feeling of Stress : Not at all  Social Connections: Unknown   Frequency of Communication with Friends and Family: More than  three times a week   Frequency of Social Gatherings with Friends and Family: More than three times a week   Attends Religious Services: More than 4 times per year   Active Member of [employer] or Organizations: Not on file   Attends Archivist Meetings: Not on file   Marital Status: Married     Review of Systems  Constitutional:  Negative for appetite change and unexpected weight change.  HENT:  Negative for congestion and sinus pressure.   Respiratory:  Negative for cough, chest tightness and shortness of breath.   Cardiovascular:  Negative for chest pain, palpitations and leg swelling.  Gastrointestinal:  Negative for abdominal pain, diarrhea, nausea and vomiting.  Genitourinary:  Negative for difficulty urinating and dysuria.  Musculoskeletal:  Negative for joint swelling and myalgias.  Skin:  Negative for color change and  rash.  Neurological:  Negative for dizziness, light-headedness and headaches.  Psychiatric/Behavioral:  Negative for agitation and dysphoric mood.       Objective:     BP 122/74   Pulse 70   Temp 97.8 F (36.6 C)   Resp 16   Ht 5\' 2"  (1.575 m)   Wt 127 lb 12.8 oz (58 kg)   LMP 10/21/1984   SpO2 99%   BMI 23.37 kg/m  Wt Readings from Last 3 Encounters:  07/15/21 127 lb 12.8 oz (58 kg)  04/11/21 128 lb (58.1 kg)  03/15/21 129 lb 6.4 oz (58.7 kg)    Physical Exam Vitals reviewed.  Constitutional:      General: She is not in acute distress.    Appearance: Normal appearance.  HENT:     Head: Normocephalic and atraumatic.     Right Ear: External ear normal.     Left Ear: External ear normal.  Eyes:     General: No scleral icterus.       Right eye: No discharge.        Left eye: No discharge.     Conjunctiva/sclera: Conjunctivae normal.  Neck:     Thyroid: No thyromegaly.  Cardiovascular:     Rate and Rhythm: Normal rate and regular rhythm.  Pulmonary:     Effort: No respiratory distress.     Breath sounds: Normal breath sounds. No wheezing.  Abdominal:     General: Bowel sounds are normal.     Palpations: Abdomen is soft.     Tenderness: There is no abdominal tenderness.  Musculoskeletal:        General: No swelling or tenderness.     Cervical back: Neck supple. No tenderness.  Lymphadenopathy:     Cervical: No cervical adenopathy.  Skin:    Findings: No erythema or rash.  Neurological:     Mental Status: She is alert.  Psychiatric:        Mood and Affect: Mood normal.        Behavior: Behavior normal.     Outpatient Encounter Medications as of 07/15/2021  Medication Sig   rosuvastatin (CRESTOR) 10 MG tablet Take 1 tablet (10 mg total) by mouth daily.   spironolactone (ALDACTONE) 25 MG tablet Take 12.5 mg by mouth daily.   Biotin 5 MG CAPS Take 5,000 mcg by mouth daily.   carvedilol (COREG) 6.25 MG tablet TAKE 1 TABLET TWICE A DAY (Patient taking  differently: Take 6.25 mg by mouth 2 (two) times daily.)   cholecalciferol (VITAMIN D) 25 MCG (1000 UNIT) tablet Take 1,000 Units by mouth daily.   dorzolamide-timolol (COSOPT) 22.3-6.8 MG/ML ophthalmic  solution Place 1 drop into both eyes 2 (two) times daily.   levothyroxine (SYNTHROID) 75 MCG tablet Take 1 tablet (75 mcg total) by mouth daily.   lisinopril (ZESTRIL) 10 MG tablet Take 1 tablet (10 mg total) by mouth daily.   Multiple Vitamins-Minerals (ZINC PO) Take 1 tablet by mouth daily.   omeprazole (PRILOSEC) 20 MG capsule Take 1 capsule (20 mg total) by mouth daily.   [DISCONTINUED] simvastatin (ZOCOR) 10 MG tablet TAKE 1 TABLET AT BEDTIME (Patient taking differently: Take 10 mg by mouth at bedtime.)   [DISCONTINUED] spironolactone (ALDACTONE) 25 MG tablet TAKE 1 TABLET DAILY (Patient taking differently: Take 25 mg by mouth daily.)   No facility-administered encounter medications on file as of 07/15/2021.     Lab Results  Component Value Date   WBC 7.9 03/02/2021   HGB 14.4 03/02/2021   HCT 42.4 03/02/2021   PLT 297.0 03/02/2021   GLUCOSE 103 (H) 07/13/2021   CHOL 175 07/13/2021   TRIG 160.0 (H) 07/13/2021   HDL 37.40 (L) 07/13/2021   LDLDIRECT 88.0 03/22/2021   LDLCALC 105 (H) 07/13/2021   ALT 30 07/13/2021   AST 18 07/13/2021   NA 139 07/13/2021   K 3.7 07/13/2021   CL 104 07/13/2021   CREATININE 0.79 07/13/2021   BUN 13 07/13/2021   CO2 27 07/13/2021   TSH 4.82 07/13/2021   INR 1.0 08/12/2015    DG Bone Density  Result Date: 03/24/2021 EXAM: DUAL X-RAY ABSORPTIOMETRY (DXA) FOR BONE MINERAL DENSITY IMPRESSION: Dear Dr. Nicki Reaper, Your patient Williams Che completed a FRAX assessment on 03/24/2021 using the [place] (analysis version: 14.10) manufactured by [employer]. The following summarizes the results of our evaluation. PATIENT BIOGRAPHICAL: Name: Natalija, Mavis Patient ID: [id number] Birth Date: Jan 02, 1941 Height:    62.0 in. Gender:     Female    [age]       Weight:    128.7 lbs. Ethnicity:  White                            Exam Date: 03/24/2021 FRAX* RESULTS:  (version: 3.5) 10-year Probability of Fracture1 Major Osteoporotic Fracture2 Hip Fracture 15.8% 5.0% Population: [place] (Caucasian) Risk Factors: None Based on Femur (Right) Neck BMD 1 -The 10-year probability of fracture may be lower than reported if the patient has received treatment. 2 -Major Osteoporotic Fracture: Clinical Spine, Forearm, Hip or Shoulder *FRAX is a Materials engineer of the [employer] for Metabolic Bone Disease, a [place] (WHO) [facility]. ASSESSMENT: The probability of a major osteoporotic fracture is 15.8% within the next ten years. The probability of a hip fracture is 5.0% within the next ten years. . Your patient Aileena Iglesia completed a BMD test on 03/24/2021 using the [place] (software version: 14.10) manufactured by [employer]. The following summarizes the results of our evaluation. Technologist: [facility] PATIENT BIOGRAPHICAL: Name: Tranika, Scholler Patient ID:  [id number] Birth Date: 02-17-1941 Height:     62.0 in. Gender:      Female    Exam Date:  03/24/2021 Weight:     128.7 lbs. Indications:           Fractures:             Treatments: DENSITOMETRY RESULTS: Site         Region  Measured Date Measured Age WHO Classification Young Adult T-score BMD         %Change vs. Previous Significant Change (*) DualFemur Neck Right 03/24/2021 80.2 Osteopenia -2.1 0.750 g/cm2 Left Forearm Radius 33% 03/24/2021 81.2 Osteopenia -1.7 0.726 g/cm2 ASSESSMENT: The BMD measured at Femur Neck Right is 0.750 g/cm2 with a T-score of -2.1. This patient is considered osteopenic according to [facility]) criteria. The scan quality is good. Lumbar spine was not utilized due to advanced degenerative changes. World Pharmacologist [facility]) criteria for post-menopausal, Caucasian Women: Normal:                    T-score at or above -1 SD Osteopenia/low bone mass: T-score between -1 and -2.5 SD Osteoporosis:             T-score at or below -2.5 SD RECOMMENDATIONS: 1. All patients should optimize calcium and vitamin D intake. 2. Consider FDA-approved medical therapies in postmenopausal women and men aged 32 years and older, based on the following: a. A hip or vertebral(clinical or morphometric) fracture b. T-score < -2.5 at the femoral neck or spine after appropriate evaluation to exclude secondary causes c. Low bone mass (T-score between -1.0 and -2.5 at the femoral neck or spine) and a 10-year probability of a hip fracture > 3% or a 10-year probability of a major osteoporosis-related fracture > 20% based on the [place]-adapted WHO algorithm 3. Clinician judgment and/or patient preferences may indicate treatment for people with 10-year fracture probabilities above or below these levels FOLLOW-UP: People with diagnosed cases of osteoporosis or at high risk for fracture should have regular bone mineral density tests. For patients eligible for Medicare, routine testing is allowed once every 2 years. The testing frequency can be increased to one year for patients who have rapidly progressing disease, those who are receiving or discontinuing medical therapy to restore bone mass, or have additional risk factors. I have reviewed this report, and agree with the above findings. Mark A. Thornton Papas, M.D. [facility] Electronically Signed   By: Lavonia Dana M.D.   On: 03/24/2021 10:37       Assessment & Plan:   Problem List Items Addressed This Visit     Aortic atherosclerosis ([place])    Change to crestor.        Relevant Medications   spironolactone (ALDACTONE) 25 MG tablet   rosuvastatin (CRESTOR) 10 MG tablet   Fatigue    Some fatigue, but overall doing well.  Follow.       Fatty liver    Found on ultrasound.  Follow liver function test.      Frequent urinary tract infections    History of UTIs.   Recently treated.  Doing better.       Hypercholesterolemia - Primary    Discussed lab results.  Discussed desire for better cholesterol control and goal LDL 70.  Change simvastatin to crestor.  Follow lipid panel and liver function tests.  Titrate if needed.        Relevant Medications   spironolactone (ALDACTONE) 25 MG tablet   rosuvastatin (CRESTOR) 10 MG tablet   Other Relevant Orders   Hepatic function panel   Hypertension    Continue spironolactone, coreg and lisinopril. Blood pressure as outlined.  Follow pressures.  Follow metabolic panel.       Relevant Medications   spironolactone (ALDACTONE) 25 MG tablet   rosuvastatin (CRESTOR) 10 MG tablet   Hypothyroidism    On thyroid  replacement.  Follow tsh.         Einar Pheasant, MD

## 2021-07-20 ENCOUNTER — Ambulatory Visit: Payer: Medicare Other | Admitting: Physical Therapy

## 2021-07-20 ENCOUNTER — Other Ambulatory Visit: Payer: Self-pay

## 2021-07-20 ENCOUNTER — Encounter: Payer: Self-pay | Admitting: Physical Therapy

## 2021-07-20 DIAGNOSIS — R2681 Unsteadiness on feet: Secondary | ICD-10-CM

## 2021-07-20 DIAGNOSIS — R42 Dizziness and giddiness: Secondary | ICD-10-CM

## 2021-07-20 NOTE — Therapy (Signed)
[place] [facility] SERVICES [address] [place], [place], [address] Phone: [phone]   Fax:  [phone]  Physical Therapy Treatment  Patient Details  Name: Crystal Lynch MRN: [id number] Date of Birth: 05-02-1941 Referring Provider (PT): Dr. Tami Ribas   Encounter Date: 07/20/2021   PT End of Session - 07/20/21 1331     Visit Number 11    Number of Visits 17    Date for PT Re-Evaluation 08/09/21    PT Start Time 1330    Equipment Utilized During Treatment Gait belt    Activity Tolerance Patient tolerated treatment well    Behavior During Therapy [facility] for tasks assessed/performed             Past Medical History:  Diagnosis Date   Arthritis    Chicken pox    Endometriosis    Esophagitis    s/p esophageal dilatation   GERD (gastroesophageal reflux disease)    History of ovarian cyst    Hypercholesterolemia    Hypertension    Hypothyroidism     Past Surgical History:  Procedure Laterality Date   ABDOMINAL SURGERY     found to have an ovarian cyst and endometriosis   APPENDECTOMY      There were no vitals filed for this visit.   Therapeutic Exercise:  Leg Press: Patient performed leg press machine #70 pounds 2 sets of 10 reps with verbal cue to control extension.  Patient able to perform with good technique after verbal cueing.  Seated Leg Exercises: In sitting with 4 pound leg weights bilaterally patient performed 2 sets of 10 reps of long arc quads with 3-second hold and 2 sets of 15 reps seated hip flexion with 3-second holds.  She perform these activities with upright posture with back away from the back of the chair for trunk and abdominal activation/stabilization.  Patient began to fatigue at the end of both sets of hip flexion and long arc quad and required verbal cueing to maintain upright trunk posture.  Mini-squats: Patient performed 2 sets of 20 reps mini-squats with 5 second holds with verbal cues to  maintain upright posture/form and muscle activation.  Sit to Stands:  Patient performed 5 reps sit to stand from armless chair with PVC press.  Patient had difficulty being able to perform sit to stand without use of upper extremities and tried modifying with allowing patient to use right upper extremity on thigh to help push-up.  Patient fatigued after performing the other leg exercises during session so therefore deferred further sit to stands this date.  Discussed proper form with performing home exercises and that in order to maintain good form patient could use right upper extremity as needed to be able to complete sit to stand, but to try to use the right arm as little as possible and to maximize the use of her legs.  Note: Portions of this document were prepared using Dragon voice recognition software and although reviewed may contain unintentional dictation errors in syntax, grammar, or spelling.    PT Education - 07/20/21 1331     Education Details reviewed exercise technique   Person(s) Educated Patient    Methods Explanation    Comprehension Verbalized understanding              PT Short Term Goals - 06/15/21 1525       PT SHORT TERM GOAL #1   Title Pt will be independent with HEP in order to improve balance and  decrease dizziness symptoms in order to decrease fall risk and improve function at home and for self-management.    Time 4    Period Weeks    Status Achieved    Target Date 05/24/21               PT Long Term Goals - 06/14/21 1127       PT LONG TERM GOAL #1   Title Patient will have improved FOTO score of 5 points or greater in order to demonstrate improvements in patient's ADLs and functional performance.    Baseline Scored 49/100 on 04/26/2021; scored 48/100 on 06/14/2021    Time 8    Period Weeks    Status On-going    Target Date 08/09/21      PT LONG TERM GOAL #2   Title Patient will reduce perceived disability to low levels as indicated by <40 on  Dizziness Handicap Inventory.    Baseline Scored 40/100 on 04/26/2021; scored 22/100 on 06/14/2021    Time 8    Period Weeks    Status Achieved      PT LONG TERM GOAL #3   Title Patient will reduce falls risk as indicated by Activities Specific Balance Confidence Scale (ABC) >67%.    Baseline Scored 53% on 04/26/2021; scored 81% on 06/14/2021    Time 8    Period Weeks    Status Achieved      PT LONG TERM GOAL #4   Title Patient will demonstrate reduced falls risk as evidenced by Dynamic Gait Index (DGI) 21/24 or greater.    Baseline Scored 19/24 on 04/26/2021; scored 23/24 on 06/14/2021    Time 8    Period Weeks    Status Achieved      PT LONG TERM GOAL #5   Title Patient will have improved lower extremity bilateral hip strength, quadriceps and hamstring muscles of 5/5 in order to improve patient's balance and mobility skills.    Baseline Patient reports that she feels like she stepped heavily on the left foot and that the left leg fatigues quickly with activities and that this impacts her balance and walking.    Time 8    Period Weeks    Status New    Target Date 08/09/21               Plan - 07/22/21 1407     Clinical Impression Statement Patient reports that sit to stand exercises are challenging for her without using her upper extremity at home.  Patient would benefit from continued practice with sit to stand activities.  Patient able to progress to 70 pounds with a leg press machine this date while maintaining good technique.  Patient would benefit from continued PT services to further address balance and strengthening deficits.  Encourage patient to follow-up as indicated.    Personal Factors and Comorbidities Comorbidity 3+    Comorbidities hypertension, hypothyroidism, hypercholseterolemia    Stability/Clinical Decision Making Evolving/Moderate complexity    Rehab Potential Good    PT Frequency 1x / week    PT Duration 8 weeks    PT Treatment/Interventions Canal[place]  Repostioning;Gait training;Stair training;Therapeutic exercise;Balance training;Neuromuscular re-education;Patient/family education;Vestibular    PT Next Visit Plan review VOR X 1, firm and foam FT and ST with head turns, try body wall rolls    PT Home Exercise Plan VOR X 1 horizontal in sitting 1 minute reps and added feet together and semi-tandem progressions on firm surface with horizontal and vertical head turns via  MedBridge; MedBridge:  Access Code: [email or web address]  URL: [email or web address]    Consulted and Agree with Plan of Care Patient                   Patient will benefit from skilled therapeutic intervention in order to improve the following deficits and impairments:     Visit Diagnosis: Unsteadiness on feet  Dizziness and giddiness     Problem List Patient Active Problem List   Diagnosis Date Noted   Aortic atherosclerosis ([place]) 04/17/2021   Right acute serous otitis media    Hyponatremia 02/23/2021   Prolonged QT interval 02/23/2021   Dizziness 09/12/2020   Fall 03/08/2020   Fatty liver 12/28/2019   Chest pain 07/25/2019   Frequent urinary tract infections 12/24/2018   Acute cystitis without hematuria 10/11/2018   Hypercalcemia 01/09/2017   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 [place], [place], [address] Phone: [phone]   Fax:  [phone]  Name: Crystal Lynch MRN: [id number] Date of Birth: [birth year]

## 2021-07-22 ENCOUNTER — Telehealth: Payer: Self-pay | Admitting: Cardiovascular Disease

## 2021-07-22 NOTE — Telephone Encounter (Signed)
*  STAT* If patient is at the pharmacy, call can be transferred to refill team.   1. Which medications need to be refilled? (please list name of each medication and dose if known) spironolactone (ALDACTONE) 25 MG tablet  2. Which pharmacy/location (including street and city if local pharmacy) is medication to be sent to? [facility] [place], [place] AT Portal to Registered [employer] Sites  3. Do they need a 30 day or 90 day supply? [place]

## 2021-07-23 ENCOUNTER — Encounter: Payer: Self-pay | Admitting: Internal Medicine

## 2021-07-23 DIAGNOSIS — R5383 Other fatigue: Secondary | ICD-10-CM | POA: Insufficient documentation

## 2021-07-23 NOTE — Assessment & Plan Note (Addendum)
Some fatigue, but overall doing well.  Follow.

## 2021-07-23 NOTE — Assessment & Plan Note (Signed)
Discussed lab results.  Discussed desire for better cholesterol control and goal LDL 70.  Change simvastatin to crestor.  Follow lipid panel and liver function tests.  Titrate if needed.

## 2021-07-23 NOTE — Assessment & Plan Note (Signed)
Found on ultrasound.  Follow liver function test.

## 2021-07-23 NOTE — Assessment & Plan Note (Signed)
Change to crestor.

## 2021-07-23 NOTE — Assessment & Plan Note (Addendum)
History of UTIs.  Recently treated.  Doing better.

## 2021-07-23 NOTE — Assessment & Plan Note (Signed)
On thyroid replacement.  Follow tsh.  

## 2021-07-23 NOTE — Addendum Note (Signed)
Addended by: Alisa Graff on: 07/23/2021 06:46 PM   Modules accepted: Orders

## 2021-07-23 NOTE — Assessment & Plan Note (Signed)
Continue spironolactone, coreg and lisinopril. Blood pressure as outlined.  Follow pressures.  Follow metabolic panel.

## 2021-07-25 MED ORDER — SPIRONOLACTONE 25 MG PO TABS: 12.5000 mg | ORAL_TABLET | Freq: Every day | ORAL | 1 refills | Status: DC

## 2021-07-25 NOTE — Telephone Encounter (Signed)
Yes that is fine

## 2021-07-25 NOTE — Telephone Encounter (Signed)
Refill has been sent in.  

## 2021-07-26 ENCOUNTER — Ambulatory Visit: Payer: Medicare Other | Attending: Unknown Physician Specialty | Admitting: Physical Therapy

## 2021-07-26 ENCOUNTER — Other Ambulatory Visit: Payer: Self-pay

## 2021-07-26 ENCOUNTER — Encounter: Payer: Self-pay | Admitting: Physical Therapy

## 2021-07-26 DIAGNOSIS — R42 Dizziness and giddiness: Secondary | ICD-10-CM | POA: Insufficient documentation

## 2021-07-26 DIAGNOSIS — R2681 Unsteadiness on feet: Secondary | ICD-10-CM | POA: Diagnosis not present

## 2021-07-26 NOTE — Therapy (Signed)
[place] MAIN [facility] SERVICES [address] [place], [place], [address] Phone: [phone]   Fax:  [phone]  Physical Therapy Treatment  Patient Details  Name: Crystal Lynch MRN: [id number] Date of Birth: Aug 15, 1941 Referring Provider (PT): Dr. Tami Ribas   Encounter Date: 07/26/2021   PT End of Session - 07/26/21 0814     Visit Number 12    Number of Visits 17    Date for PT Re-Evaluation 08/09/21    PT Start Time 0813    PT Stop Time 0900    PT Time Calculation (min) 47 min    Equipment Utilized During Treatment Gait belt    Activity Tolerance Patient tolerated treatment well    Behavior During Therapy WFL for tasks assessed/performed             Past Medical History:  Diagnosis Date   Arthritis    Chicken pox    Endometriosis    Esophagitis    s/p esophageal dilatation   GERD (gastroesophageal reflux disease)    History of ovarian cyst    Hypercholesterolemia    Hypertension    Hypothyroidism     Past Surgical History:  Procedure Laterality Date   ABDOMINAL SURGERY     found to have an ovarian cyst and endometriosis   APPENDECTOMY      There were no vitals filed for this visit.   Subjective Assessment - 07/26/21 0814     Subjective Patient states she feels the leg press machine caused her to have left hip bursitis discomfort for several days after last session. Patient states she has ankle weights at home and she was able to do some leg exercises at home. Patient states that the ankle weights that she has you can adjust the weight in 1 pound increments up to 5 pounds.    Pertinent History Patient reports that she began having dizziness within the past year. Patient was admitted to the hospital due to hyponatremia and dizziness on Feb 23, 2021. Patient was seen by Dr. Tami Ribas, ENT physician and underwent VNG testing. Per Dr. Ileene Hutchinson visit note on 5/31, patient with 39% left caloric weakness noted on VNG test.  Patient Patient describes her dizziness as lightheadedness and unsteadiness. Patient denies vertigo. Patient reports she is getting dizziness daily, but states it is not as severe as it was when it first began. Patient reports the dizziness lasts minutes. Patient states she notices it more if she rides an hour or more in the car and when she gets out of the car she gets more dizziness. Patient states she tries to keep her head straight ahead. Patient states when she sits for a long period of time at church she gets dizzy and lightheaded when she stands, but it eases as she moves around. Aggravating factors include dizziness after sitting for long periods of time, looking up, quick movements and head movements. Patient reports that standing in place and staying still helps to ease her symptoms. Patient reports that she uses [employer] in the community and reports no AD use in the home, but states she does touch furniture at times for balance when ambulating about her home. Patient reports that she veers when she walks at times.    Limitations Sitting;Standing;House hold activities    Patient Stated Goals patient would like to have decreased dizziness and improved balance    Pain Score 3     Pain Location Hip    Pain Orientation Left;Lateral  Therapeutic Exercise:  Sit to Stands:  Patient educated as to proper form with sit to stand. Patient performed 1 set of 8 reps and 1 set of 5 reps sit to stand from armless chair with arm press with verbal cuing for technique/core activation and to not let her knees collapse towards each other. Patient initially was adducting her knees together during concentric and eccentric movement during sit to stand, but improved with cuing.  Patient struggled towards the last few reps secondary to leg fatigue to complete repetitions.   Slow Marching: Patient performed on firm surface with bilateral UE support, 2 sets of 15 reps slow marching with 3 second holds  with 2# ankle weights bilaterally. Patient required CGA and verbal cuing for upright posture for optimal muscle recruitment and sequencing.   Standing Exercises: Standing with # 4lb ankle weight: CGA for stability Hip extension with bilateral UE upper extremity support, cueing for neutral hip alignment, upright posture for optimal muscle recruitment, and sequencing, 1 set of 15x each LE,  Hip abduction with bilateral UE upper extremity support, cueing for neutral foot alignment for correct muscle activation, 2 sets of 15x each LE Hip flexion with bilateral UE upper extremity support, cueing for body mechanics, speed of muscle recruitment for optimal strengthening and stabilization 2 sets of 15x each LE  Discussed with patient about what exercises she could continue to do at home for home exercise program. Patient states that she has adjustable ankle weights that adjust by 1 pound increments up to 5 pounds. Patient states that she likes the ankle weight exercises and they do not seem to aggravate her left hip. Therefore, added standing leg exercises with ankle weights for home exercise program.  In addition discussed [employer] sneakers, [employer] and community gyms with patient.  Pt educated throughout session about proper posture and technique with exercises. Improved exercise technique, movement at target joints, use of target muscles after min to mod verbal, visual, tactile cues.  Access Code: [email or web address] URL: [email or web address] Date: 07/26/2021 Prepared by: Lady Deutscher  Exercises Seated VOR X1 exercise - 3 x daily - 7 x weekly - 1 sets - 3 reps - 1 minute hold Feet Together with Vertical Head turns - 1 x daily - 7 x weekly - 1 sets - 5 reps - 1 minute hold Feet Together Balance with Head Rotation - 1 x daily - 7 x weekly - 1 sets - 5 reps - 1 minute hold Half Tandem Stance Balance with Head Rotation - 1 x daily - 7 x weekly - 1 sets - 5 reps - 1 minute hold Sit to Stand Without Arm  Support - 1 x daily - 7 x weekly - 2 sets - 10 reps Standing March with Counter Support - 1 x daily - 7 x weekly - 2 sets - 15 reps - 3 seconds hold Standing Hip Flexion AROM - 1 x daily - 7 x weekly - 2 sets - 15 reps - 3 seconds hold Standing Hip Extension with Counter Support - 1 x daily - 7 x weekly - 2 sets - 15 reps - 3seconds hold Standing Hip Abduction with Counter Support - 1 x daily - 7 x weekly - 2 sets - 15 reps - 3 seconds hold  `   PT Education - 07/26/21 0814     Education Details discussed [employer], [employer] and other community gyms for when patient is discharged from therapy as options for ways she can maintain gains made in thereapy and  to continue building lower extremity strength.  Added standing hip exercises at counter with ankle weights via MedBridge to home exercise program.    Person(s) Educated Patient    Methods Explanation;Handout;Verbal cues;Demonstration    Comprehension Verbalized understanding;Returned demonstration              PT Short Term Goals - 06/15/21 1525       PT SHORT TERM GOAL #1   Title Pt will be independent with HEP in order to improve balance and decrease dizziness symptoms in order to decrease fall risk and improve function at home and for self-management.    Time 4    Period Weeks    Status Achieved    Target Date 05/24/21               PT Long Term Goals - 06/14/21 1127       PT LONG TERM GOAL #1   Title Patient will have improved FOTO score of 5 points or greater in order to demonstrate improvements in patient's ADLs and functional performance.    Baseline Scored 49/100 on 04/26/2021; scored 48/100 on 06/14/2021    Time 8    Period Weeks    Status On-going    Target Date 08/09/21      PT LONG TERM GOAL #2   Title Patient will reduce perceived disability to low levels as indicated by <40 on Dizziness Handicap Inventory.    Baseline Scored 40/100 on 04/26/2021; scored 22/100 on 06/14/2021    Time 8    Period Weeks     Status Achieved      PT LONG TERM GOAL #3   Title Patient will reduce falls risk as indicated by Activities Specific Balance Confidence Scale (ABC) >67%.    Baseline Scored 53% on 04/26/2021; scored 81% on 06/14/2021    Time 8    Period Weeks    Status Achieved      PT LONG TERM GOAL #4   Title Patient will demonstrate reduced falls risk as evidenced by Dynamic Gait Index (DGI) 21/24 or greater.    Baseline Scored 19/24 on 04/26/2021; scored 23/24 on 06/14/2021    Time 8    Period Weeks    Status Achieved      PT LONG TERM GOAL #5   Title Patient will have improved lower extremity bilateral hip strength, quadriceps and hamstring muscles of 5/5 in order to improve patient's balance and mobility skills.    Baseline Patient reports that she feels like she stepped heavily on the left foot and that the left leg fatigues quickly with activities and that this impacts her balance and walking.    Time 8    Period Weeks    Status New    Target Date 08/09/21                   Plan - 07/27/21 0744     Clinical Impression Statement Patient with good motivation to session.  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  Therefore we will continue to require modifications of interventions in standing as to not aggravate left hip pain.  Patient would benefit from continued lower extremity strengthening exercises within the pain-free range.  Patient issued standing hip exercises at counter with ankle weights for home exercise program as patient states that she already has ankle weights and states this would be convenient for her to  do at home.  In addition discussed Silver sneakers and community gyms as an additional option to help patient maintain gains and to further improve lower extremity strengthening and balance skills.  Next session will plan on repeating functional outcome measure testing and lower  extremity strength testing.  Encourage patient to follow-up as indicated.    Personal Factors and Comorbidities Comorbidity 3+    Comorbidities hypertension, hypothyroidism, hypercholseterolemia    Stability/Clinical Decision Making Evolving/Moderate complexity    Rehab Potential Good    PT Frequency 1x / week    PT Duration 8 weeks    PT Treatment/Interventions Canalith Repostioning;Gait training;Stair training;Therapeutic exercise;Balance training;Neuromuscular re-education;Patient/family education;Vestibular    PT Next Visit Plan review VOR X 1, firm and foam FT and ST with head turns, try body wall rolls    PT Home Exercise Plan VOR X 1 horizontal in sitting 1 minute reps and added feet together and semi-tandem progressions on firm surface with horizontal and vertical head turns via MedBridge; MedBridge:  Access Code: [email or web address]  URL: [email or web address]    Consulted and Agree with Plan of Care Patient             Patient will benefit from skilled therapeutic intervention in order to improve the following deficits and impairments:  Dizziness, Decreased balance, Difficulty walking  Visit Diagnosis: Unsteadiness on feet  Dizziness and giddiness     Problem List Patient Active Problem List   Diagnosis Date Noted   Fatigue 07/23/2021   Aortic atherosclerosis ([place]) 04/17/2021   Right acute serous otitis media    Hyponatremia 02/23/2021   Prolonged QT interval 02/23/2021   Dizziness 09/12/2020   Fall 03/08/2020   Fatty liver 12/28/2019   Chest pain 07/25/2019   Frequent urinary tract infections 12/24/2018   Acute cystitis without hematuria 10/11/2018   Hypercalcemia 01/09/2017   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 [place], [place], [address] Phone: [phone]   Fax:  [phone]  Name: Crystal Lynch MRN: [id number] Date of Birth: Sep 16, 1941

## 2021-07-27 ENCOUNTER — Other Ambulatory Visit: Payer: Self-pay

## 2021-07-27 MED ORDER — CARVEDILOL 6.25 MG PO TABS
6.2500 mg | ORAL_TABLET | Freq: Two times a day (BID) | ORAL | 0 refills | Status: DC
Start: 2021-07-27 — End: 2021-10-06

## 2021-08-01 ENCOUNTER — Other Ambulatory Visit: Payer: Self-pay

## 2021-08-01 MED ORDER — OMEPRAZOLE 20 MG PO CPDR: 20.0000 mg | DELAYED_RELEASE_CAPSULE | Freq: Every day | ORAL | 3 refills | Status: DC

## 2021-08-02 ENCOUNTER — Encounter: Payer: Self-pay | Admitting: Physical Therapy

## 2021-08-02 ENCOUNTER — Ambulatory Visit: Payer: Medicare Other | Admitting: Physical Therapy

## 2021-08-02 ENCOUNTER — Other Ambulatory Visit: Payer: Self-pay

## 2021-08-02 DIAGNOSIS — R42 Dizziness and giddiness: Secondary | ICD-10-CM

## 2021-08-02 DIAGNOSIS — R2681 Unsteadiness on feet: Secondary | ICD-10-CM

## 2021-08-02 NOTE — Therapy (Signed)
[place] MAIN [facility] SERVICES [address] [place], [place], [address] Phone: [phone]   Fax:  [phone]  Physical Therapy Treatment/Discharge Summary Total number of visits 17 Dates of service: 04/26/2021 - 08/02/2021  Patient Details  Name: Crystal Lynch MRN: [id number] Date of Birth: December 24, 1940 Referring Provider (PT): Dr. Tami Ribas   Encounter Date: 08/02/2021   PT End of Session - 08/02/21 0823     Visit Number 13    Number of Visits 17    Date for PT Re-Evaluation 08/09/21    PT Start Time 0818    PT Stop Time 0902    PT Time Calculation (min) 44 min    Equipment Utilized During Treatment --    Activity Tolerance Patient tolerated treatment well    Behavior During Therapy [facility] for tasks assessed/performed             Past Medical History:  Diagnosis Date   Arthritis    Chicken pox    Endometriosis    Esophagitis    s/p esophageal dilatation   GERD (gastroesophageal reflux disease)    History of ovarian cyst    Hypercholesterolemia    Hypertension    Hypothyroidism     Past Surgical History:  Procedure Laterality Date   ABDOMINAL SURGERY     found to have an ovarian cyst and endometriosis   APPENDECTOMY      There were no vitals filed for this visit.   Subjective Assessment - 08/02/21 0820     Subjective Patient [place] and she is hoping that she will not get motion sickness. Patient reports soreness in the legs from doing her home exercises. Patient states she has been trying to get some more strength in her legs.  Patient reports that she is pleased with she is doing especially with her walking.  Patient reports she used to have to hang onto things when she first stood up to be able to walk and now she reports she takes her cane and goes. Patient reports that her husband feels that she has improved a lot as well.  She reports that she has no questions or concerns in regards  to her home exercise program.    Pertinent History Patient reports that she began having dizziness within the past year. Patient was admitted to the hospital due to hyponatremia and dizziness on Feb 23, 2021. Patient was seen by Dr. Tami Ribas, ENT physician and underwent VNG testing. Per Dr. Ileene Hutchinson visit note on 5/31, patient with 39% left caloric weakness noted on VNG test. Patient Patient describes her dizziness as lightheadedness and unsteadiness. Patient denies vertigo. Patient reports she is getting dizziness daily, but states it is not as severe as it was when it first began. Patient reports the dizziness lasts minutes. Patient states she notices it more if she rides an hour or more in the car and when she gets out of the car she gets more dizziness. Patient states she tries to keep her head straight ahead. Patient states when she sits for a long period of time at church she gets dizzy and lightheaded when she stands, but it eases as she moves around. Aggravating factors include dizziness after sitting for long periods of time, looking up, quick movements and head movements. Patient reports that standing in place and staying still helps to ease her symptoms. Patient reports that she uses [employer] in the community and reports no AD use in the  home, but states she does touch furniture at times for balance when ambulating about her home. Patient reports that she veers when she walks at times.    Limitations Sitting;Standing;House hold activities    Patient Stated Goals patient would like to have decreased dizziness and improved balance            Therapeutic Exercise:  MMT: Left lower extremity: 4/5 hip flexor, 5/5 hip abduction, 4/5 hip adduction, 5/5 hamstrings, 5/5 quadriceps and 5/5 dorsiflexion. Right lower extremity: 4/5 hip flexor, 5/5 hip abduction, 4/5 hip adduction, 5/5 hamstrings, 5/5 quadriceps and 5/5 dorsiflexion.  Sit to stand: Patient performed 10 reps sit to stand initially without  upper extremity support and then needed few finger assist bilaterally on thighs to be able to perform with good form.   Patient does continue to have difficulty with eccentric control descent to sit.  Standing exercises: Patient performed 1 set of 15 reps with 2 pound left ankle weight and 4 pound right ankle weight therapeutic exercises as follows-hip flexion, hip abduction, hip extension.  Patient required verbal and tactile cueing for proper technique, muscle activation and form. Patient performed 1 set of 15 reps with 2 pound left ankle weight and 4 pound right ankle weight slow marching with 3-second holds each leg.  Discussed functional outcome measure and manual muscle testing and compared to prior testing. Patient scored 50/100 on FOTO which is a one-point improvement as compared to initial testing.  Patient improved from 53% to 81% on the ABC scale and improved from 19/24 to 23/24 on the dynamic gait index scale.  Decreased from 40/100-22/100 indicating low perception of handicap on the dizziness handicap inventory. Patient improved with left and right lower extremity strengthening to 5/5 hip abduction. Discussed progress towards goals and discharge plans discussed home exercise program and community gyms and Silver sne[employer] program.     PT Education - 08/02/21 [phone]     Education Details discussed home exercise program and discharge, discussed exercise technique especially for home exercise program, discussed progress towards goals and functional outcome measures and compared to prior testing, discussed Silver sneakers and community gyms discussed discharge plans    Person(s) Educated Patient    Methods Explanation;Verbal cues;Tactile cues    Comprehension Verbalized understanding              PT Short Term Goals - 06/15/21 1525       PT SHORT TERM GOAL #1   Title Pt will be independent with HEP in order to improve balance and decrease dizziness symptoms in order to decrease  fall risk and improve function at home and for self-management.    Time 4    Period Weeks    Status Achieved    Target Date 05/24/21               PT Long Term Goals - 08/02/21 1017       PT LONG TERM GOAL #1   Title Patient will have improved FOTO score of 5 points or greater in order to demonstrate improvements in patient's ADLs and functional performance.    Baseline Scored 49/100 on 04/26/2021; scored 48/100 on 06/14/2021; scored 50/100 on 08/02/21    Time 8    Period Weeks    Status Partially Met      PT LONG TERM GOAL #2   Title Patient will reduce perceived disability to low levels as indicated by <40 on Dizziness Handicap Inventory.    Baseline Scored 40/100 on 04/26/2021; scored  22/100 on 06/14/2021    Time 8    Period Weeks    Status Achieved      PT LONG TERM GOAL #3   Title Patient will reduce falls risk as indicated by Activities Specific Balance Confidence Scale (ABC) >67%.    Baseline Scored 53% on 04/26/2021; scored 81% on 06/14/2021    Time 8    Period Weeks    Status Achieved      PT LONG TERM GOAL #4   Title Patient will demonstrate reduced falls risk as evidenced by Dynamic Gait Index (DGI) 21/24 or greater.    Baseline Scored 19/24 on 04/26/2021; scored 23/24 on 06/14/2021    Time 8    Period Weeks    Status Achieved      PT LONG TERM GOAL #5   Title Patient will have improved lower extremity bilateral hip strength, quadriceps and hamstring muscles of 5/5 in order to improve patient's balance and mobility skills.    Baseline Patient reports that she feels like she stepped heavily on the left foot and that the left leg fatigues quickly with activities and that this impacts her balance and walking.; on 08/02/21 patient with 5/5 bilateral quads and hams strength and 4/5 hip flexion, Abduction and adduction strength bilaterally    Time 8    Period Weeks    Status Partially Met                Plan - 08/05/21 1222     Clinical Impression Statement  Patient reports that she has not had any further dizziness symptoms in several weeks now and that she is pleased with her progress.  Patient reports that she feels that she has improved a lot.  Patient has met 1/1 short-term goal and 3/5 long-term goals and partially met remaining 2/5 long-term goals.  Patient improved from a score of 53% to 81% on the ABC scale indicating decreased falls risk.  Patient improved from 19/24-23/24 on the dynamic gait index scale indicating safe for community mobility.  Patient's dizziness handicap inventory score improved from 40/100 down to 22/100 indicating low perception of handicap.  Patient's FOTO score changed from 49/100-50/100 indicating patient continues to have difficulties with functional mobility tasks.  Patient is independent with home exercise program and plans to continue to perform upon discharge.  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  Patient in agreement with discharge at this time.  Will discharge patient with plan to continue home exercise program.    Personal Factors and Comorbidities Comorbidity 3+    Comorbidities hypertension, hypothyroidism, hypercholseterolemia    Stability/Clinical Decision Making Evolving/Moderate complexity    Rehab Potential Good    PT Frequency 1x / week    PT Duration 8 weeks    PT Treatment/Interventions Canalith Repostioning;Gait training;Stair training;Therapeutic exercise;Balance training;Neuromuscular re-education;Patient/family education;Vestibular    PT Next Visit Plan review VOR X 1, firm and foam FT and ST with head turns, try body wall rolls    PT Home Exercise Plan VOR X 1 horizontal in sitting 1 minute reps and added feet together and semi-tandem progressions on firm surface with horizontal and vertical head turns via MedBridge; MedBridge:  Access Code: [email or web address]  URL: [email or web address]    Consulted and Agree with Plan of Care  Patient                  Patient will benefit from skilled therapeutic intervention in order to improve  the following deficits and impairments:     Visit Diagnosis: Unsteadiness on feet  Dizziness and giddiness     Problem List Patient Active Problem List   Diagnosis Date Noted   Fatigue 07/23/2021   Aortic atherosclerosis ([place]) 04/17/2021   Right acute serous otitis media    Hyponatremia 02/23/2021   Prolonged QT interval 02/23/2021   Dizziness 09/12/2020   Fall 03/08/2020   Fatty liver 12/28/2019   Chest pain 07/25/2019   Frequent urinary tract infections 12/24/2018   Acute cystitis without hematuria 10/11/2018   Hypercalcemia 01/09/2017   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

## 2021-08-07 ENCOUNTER — Other Ambulatory Visit: Payer: Self-pay | Admitting: Cardiovascular Disease

## 2021-08-11 ENCOUNTER — Telehealth: Payer: Self-pay | Admitting: Internal Medicine

## 2021-08-11 NOTE — Telephone Encounter (Signed)
Charlotte from [employer] is calling to get a prior auth updated on the patients omeprazole (PRILOSEC) 20 MG capsule.Please fax it over to [phone] or call at [phone].

## 2021-08-16 NOTE — Telephone Encounter (Signed)
Patient is calling to check on the status of the message below.Please call her on her home phone at [phone].

## 2021-08-17 NOTE — Telephone Encounter (Signed)
Spoke with patient. She says that we dealt with this last year and it has been nothing but a head ache when she can buy the same meds OTC- just more expensive. She is wondering if we can try something different and see if it works for her. Currently on omeprazole 20 mg.

## 2021-08-17 NOTE — Telephone Encounter (Signed)
If taking omeprazole 20mg  q day, if agreeable, can try protonix 40mg  q day.  Ok to send in rx.

## 2021-08-18 NOTE — Telephone Encounter (Signed)
Called patient. Husband stated she was not home. She will call me back.

## 2021-08-19 ENCOUNTER — Other Ambulatory Visit: Payer: Self-pay

## 2021-08-19 ENCOUNTER — Other Ambulatory Visit: Payer: Self-pay | Admitting: Internal Medicine

## 2021-08-19 MED ORDER — PANTOPRAZOLE SODIUM 40 MG PO TBEC: 40.0000 mg | DELAYED_RELEASE_TABLET | Freq: Every day | ORAL | 1 refills | Status: DC

## 2021-08-19 NOTE — Telephone Encounter (Signed)
Per chart review, her insurance would not cover omeprazole.  We changed her to protonix.  It appears they are asking for more information.

## 2021-08-19 NOTE — Telephone Encounter (Signed)
Patient aware and protonix sent to local pharmacy

## 2021-08-22 ENCOUNTER — Other Ambulatory Visit: Payer: Self-pay | Admitting: Internal Medicine

## 2021-08-26 ENCOUNTER — Other Ambulatory Visit: Payer: Self-pay

## 2021-08-26 ENCOUNTER — Other Ambulatory Visit (INDEPENDENT_AMBULATORY_CARE_PROVIDER_SITE_OTHER): Payer: Medicare Other

## 2021-08-26 DIAGNOSIS — E7800 Pure hypercholesterolemia, unspecified: Secondary | ICD-10-CM

## 2021-08-26 LAB — HEPATIC FUNCTION PANEL
ALT: 15 U/L (ref 0–35)
AST: 13 U/L (ref 0–37)
Albumin: 4.3 g/dL (ref 3.5–5.2)
Alkaline Phosphatase: 57 U/L (ref 39–117)
Bilirubin, Direct: 0.2 mg/dL (ref 0.0–0.3)
Total Bilirubin: 1.3 mg/dL — ABNORMAL HIGH (ref 0.2–1.2)
Total Protein: 6.1 g/dL (ref 6.0–8.3)

## 2021-08-29 ENCOUNTER — Telehealth: Payer: Self-pay | Admitting: Cardiovascular Disease

## 2021-08-29 NOTE — Telephone Encounter (Addendum)
Spoke with the patient.  Patient called to repot elevated BP readings over the last couple of days. Readings provided below. the dizziness reported in the previous message is chronic and has been going on for some time.  Patient called the office several months ago to report low BP. At that time Dr. Fletcher Anon recommended decreasing her lisinopril from 20 mg down to 10 mfg daily.  She is taking the current anti-hypertensive medications: Lisinopril 10 mg daily Carvedilol 6.25 mg bid Spironolactone 12.5 mg daily.  Patients bp this morning prior to her medications 172/87 77 bpm. Bp recheck 3 hours after morning meds 151/97 67 bpm  Patient was last in the office in Feb 2022 with f/u in 21yr. Adv the patient that due to the need for several med adjustments over the phone I would recommend that an appt be scheduled for her to be seen. She will be out of town early next week. Appt scheduled on 09/09/21 @ 8:25 am with Ignacia Bayley, NP.  Adv the patient that in the interim I will fwd the message to Dr. Fletcher Anon for further recommendation. Patient agreeable and voiced appreciation for the call back.

## 2021-08-29 NOTE — Telephone Encounter (Signed)
Agree with an office appointment.  I suggest increasing lisinopril back to 20 mg daily in the meanwhile.

## 2021-08-29 NOTE — Telephone Encounter (Signed)
Pt c/o BP issue: STAT if pt c/o blurred vision, one-sided weakness or slurred speech  1. What are your last 5 BP readings?  ~195/80 181/86 172/84  2. Are you having any other symptoms (ex. Dizziness, headache, blurred vision, passed out)? dizzy  3. What is your BP issue? Patient's BP is elevated, would like to discuss possible med changes.

## 2021-08-30 MED ORDER — LISINOPRIL 20 MG PO TABS: 20.0000 mg | ORAL_TABLET | Freq: Every day | ORAL | Status: DC

## 2021-08-30 NOTE — Telephone Encounter (Signed)
DPR on file with ok to leave a detailed message on the patients  home telephone. Lmom with Dr. Tyrell Antonio response and recommendation.  Patient is to increase Lisinopril to 20 mg daily. Patient is to keep her scheduled 09/09/21 appt. Patient is to contact the office in the interim if any questions or concerns.

## 2021-08-30 NOTE — Telephone Encounter (Signed)
Patient returning call.

## 2021-08-30 NOTE — Telephone Encounter (Signed)
Called the patient to discuss. Left a msg with the patients husband asking that she return the call.

## 2021-09-09 ENCOUNTER — Ambulatory Visit (INDEPENDENT_AMBULATORY_CARE_PROVIDER_SITE_OTHER): Payer: Medicare Other | Admitting: Nurse Practitioner

## 2021-09-09 ENCOUNTER — Other Ambulatory Visit: Payer: Self-pay

## 2021-09-09 ENCOUNTER — Encounter: Payer: Self-pay | Admitting: Nurse Practitioner

## 2021-09-09 VITALS — BP 134/70 | HR 74 | Ht 62.0 in | Wt 131.0 lb

## 2021-09-09 DIAGNOSIS — I10 Essential (primary) hypertension: Secondary | ICD-10-CM | POA: Diagnosis not present

## 2021-09-09 MED ORDER — LISINOPRIL 20 MG PO TABS: 20.0000 mg | ORAL_TABLET | Freq: Every day | ORAL | Status: DC

## 2021-09-09 NOTE — Progress Notes (Signed)
Office Visit    Patient Name: Crystal Lynch Date of Encounter: 09/09/2021  Primary Care Provider:  Einar Pheasant, MD Primary Cardiologist:  Kathlyn Sacramento, MD  Chief Complaint    80 year old female with a history of refractory hypertension, hyperlipidemia, hypothyroidism, and GERD, who presents for follow-up related to hypertension.  Past Medical History    Past Medical History:  Diagnosis Date   Arthritis    Chicken pox    Endometriosis    Esophagitis    s/p esophageal dilatation   GERD (gastroesophageal reflux disease)    History of ovarian cyst    Hypercholesterolemia    Hypertension    Hypothyroidism    Past Surgical History:  Procedure Laterality Date   ABDOMINAL SURGERY     found to have an ovarian cyst and endometriosis   APPENDECTOMY      Allergies  Allergies  Allergen Reactions   Meloxicam     BP issues    History of Present Illness    80 year old female with the above past medical history including hypertension, hyperlipidemia, hypothyroidism, and GERD.  She was previous evaluated in October 2016 with renal artery angiography, which showed no significant renal artery stenosis.  Her blood pressure has been managed with low-dose carvedilol, low dose lisinopril, and spironolactone.  She was last seen in cardiology clinic in February 2022, at which time her blood pressure was 142/76 and she was otherwise doing well.  She contacted our office on November 7 secondary to elevated blood pressures at home trending from the 150s to 170s.  She was advised to increase lisinopril to 20 mg daily and scheduled for follow-up.  Since adjusting her lisinopril dose, pressures have been more stable.  Pressure is 134/70 today.  She has chronic dizziness, for which she has received vestibular training.  Due to dizziness and unsteady gait, she uses a cane when she walks.  She is relatively inactive.  She does plan on joining [employer] here at the hospital in January.   She denies chest pain, dyspnea, palpitations, PND, orthopnea, syncope, edema, or early satiety.    Home Medications    Current Outpatient Medications  Medication Sig Dispense Refill   Biotin 5 MG CAPS Take 5,000 mcg by mouth daily.     carvedilol (COREG) 6.25 MG tablet Take 1 tablet (6.25 mg total) by mouth 2 (two) times daily. 180 tablet 0   cholecalciferol (VITAMIN D) 25 MCG (1000 UNIT) tablet Take 1,000 Units by mouth daily.     dorzolamide-timolol (COSOPT) 22.3-6.8 MG/ML ophthalmic solution Place 1 drop into both eyes 2 (two) times daily.     levothyroxine (SYNTHROID) 75 MCG tablet Take 1 tablet (75 mcg total) by mouth daily. 90 tablet 1   lisinopril (ZESTRIL) 20 MG tablet Take 1 tablet (20 mg total) by mouth daily.     Multiple Vitamins-Minerals (ZINC PO) Take 1 tablet by mouth daily.     pantoprazole (PROTONIX) 40 MG tablet TAKE 1 TABLET BY MOUTH EVERY DAY(90 DAY FILL EVERY 365 DAYS. NF DUE ON 02/2022) 30 tablet 1   rosuvastatin (CRESTOR) 10 MG tablet Take 1 tablet (10 mg total) by mouth daily. 90 tablet 1   spironolactone (ALDACTONE) 25 MG tablet Take 0.5 tablets (12.5 mg total) by mouth daily. 45 tablet 1   No current facility-administered medications for this visit.     Review of Systems    She has chronic dizziness for which she receives vestibular training.  She denies chest pain, palpitations, dyspnea,  pnd, orthopnea, n, v, syncope, edema, weight gain, or early satiety.  All other systems reviewed and are otherwise negative except as noted above.    Physical Exam    VS:  BP 134/70 (BP Location: Left Arm, Patient Position: Sitting, Cuff Size: Normal)   Pulse 74   Ht 5\' 2"  (1.575 m)   Wt 131 lb (59.4 kg)   LMP 10/21/1984   SpO2 99%   BMI 23.96 kg/m  , BMI Body mass index is 23.96 kg/m.     GEN: Well nourished, well developed, in no acute distress. HEENT: normal. Neck: Supple, no JVD, carotid bruits, or masses. Cardiac: RRR, no murmurs, rubs, or gallops. No clubbing,  cyanosis, edema.  PT 2+ and equal bilaterally.  Respiratory:  Respirations regular and unlabored, clear to auscultation bilaterally. GI: Soft, nontender, nondistended, BS + x 4. MS: no deformity or atrophy. Skin: warm and dry, no rash. Neuro:  Strength and sensation are intact. Psych: Normal affect.  Accessory Clinical Findings     Lab Results  Component Value Date   WBC 7.9 03/02/2021   HGB 14.4 03/02/2021   HCT 42.4 03/02/2021   MCV 93.1 03/02/2021   PLT 297.0 03/02/2021   Lab Results  Component Value Date   CREATININE 0.79 07/13/2021   BUN 13 07/13/2021   NA 139 07/13/2021   K 3.7 07/13/2021   CL 104 07/13/2021   CO2 27 07/13/2021   Lab Results  Component Value Date   ALT 15 08/26/2021   AST 13 08/26/2021   ALKPHOS 57 08/26/2021   BILITOT 1.3 (H) 08/26/2021   Lab Results  Component Value Date   CHOL 175 07/13/2021   HDL 37.40 (L) 07/13/2021   LDLCALC 105 (H) 07/13/2021   LDLDIRECT 88.0 03/22/2021   TRIG 160.0 (H) 07/13/2021   CHOLHDL 5 07/13/2021     Assessment & Plan    1.  Essential hypertension: Patient recently with elevated blood pressures at home.  She was advised to increase lisinopril to 20 mg daily and since doing, pressures have stabilized.  She is 134/70 today.  Continue current doses of carvedilol, lisinopril, and spironolactone.  Follow-up basic metabolic panel today in light of increased dose of lisinopril.  2.  Hyperlipidemia: She remains on simvastatin therapy.  LDL was 88 in May with a calculated LDL of 105 in September in the setting of high triglycerides-?  Fasting.  Continue simvastatin.  3.  Chronic dizziness: Seen by ENT and undergoing vestibular training.  4.  Disposition: Follow-up basic metabolic panel today.  Follow-up in 6 months or sooner if necessary.   Murray Hodgkins, NP 09/09/2021, 8:53 AM

## 2021-09-09 NOTE — Patient Instructions (Signed)
Medication Instructions:  Refill sent into your mail order pharmacy for the Lisinopril 20 mg once a day.  *If you need a refill on your cardiac medications before your next appointment, please call your pharmacy*   Lab Work: BMET today  If you have labs (blood work) drawn today and your tests are completely normal, you will receive your results only by: [facility] (if you have MyChart) OR A paper copy in the mail If you have any lab test that is abnormal or we need to change your treatment, we will call you to review the results.   Testing/Procedures: None   Follow-Up: At [facility], you and your health needs are our priority.  As part of our continuing mission to provide you with exceptional heart care, we have created designated Provider Care Teams.  These Care Teams include your primary Cardiologist (physician) and Advanced Practice Providers (APPs -  Physician Assistants and Nurse Practitioners) who all work together to provide you with the care you need, when you need it.   Your next appointment:   6 month(s)  The format for your next appointment:   In Person  Provider:   Kathlyn Sacramento, MD or Murray Hodgkins, NP

## 2021-09-10 LAB — BASIC METABOLIC PANEL
BUN/Creatinine Ratio: 18 (ref 12–28)
BUN: 13 mg/dL (ref 8–27)
CO2: 24 mmol/L (ref 20–29)
Calcium: 10.2 mg/dL (ref 8.7–10.3)
Chloride: 101 mmol/L (ref 96–106)
Creatinine, Ser: 0.74 mg/dL (ref 0.57–1.00)
Glucose: 88 mg/dL (ref 70–99)
Potassium: 3.6 mmol/L (ref 3.5–5.2)
Sodium: 140 mmol/L (ref 134–144)
eGFR: 81 mL/min/{1.73_m2} (ref >59)

## 2021-09-21 ENCOUNTER — Encounter: Payer: Self-pay | Admitting: Internal Medicine

## 2021-09-21 ENCOUNTER — Telehealth: Payer: Self-pay

## 2021-09-21 DIAGNOSIS — R42 Dizziness and giddiness: Secondary | ICD-10-CM

## 2021-09-21 NOTE — Telephone Encounter (Signed)
I need a second opinion as I don't feel I am  improving . It has been several months following Dr. Reggy Eye suggestions. I need your advise as soon as possible as things seem to be going down hill for me. Thank you for your help!!   Crystal Lynch

## 2021-09-21 NOTE — Telephone Encounter (Signed)
I do not mind placing an order for a referral to neurology.  Does she have a preference of which neurologist she wants to see.

## 2021-09-21 NOTE — Telephone Encounter (Signed)
Patient says he saw Dr. Tami Ribas says he has weakness in her left ear and that's what makes her dizzy and she took the vestibular syrup but is no better is there anything else she can do she would like to see a neurologist to see if there is something else causing this or if something else can be done.

## 2021-09-22 NOTE — Telephone Encounter (Signed)
Patient agreeable to Physician preference, referral placed.

## 2021-09-22 NOTE — Addendum Note (Signed)
Addended by: Nanci Pina on: 09/22/2021 01:24 PM   Modules accepted: Orders

## 2021-10-06 ENCOUNTER — Other Ambulatory Visit: Payer: Self-pay | Admitting: Cardiovascular Disease

## 2021-10-20 ENCOUNTER — Telehealth: Payer: Self-pay | Admitting: Cardiovascular Disease

## 2021-10-20 NOTE — Telephone Encounter (Signed)
Please call to discuss patient's blood pressure medications. She would like to make sure she is on the right dosage. Lisinopril 20mg  and Spironolactone and Carvedilol.

## 2021-10-20 NOTE — Telephone Encounter (Signed)
Reviewed medications, dosages, and instructions for her cardiac medications. She confirmed each medication and instructions. Patient states that she is taking them correctly and that her spouse thought she was confused. She verbalized understanding of our conversation with no further questions at this time.

## 2021-11-01 ENCOUNTER — Other Ambulatory Visit: Payer: Self-pay | Admitting: Neurology

## 2021-11-01 ENCOUNTER — Telehealth: Payer: Self-pay | Admitting: Internal Medicine

## 2021-11-01 ENCOUNTER — Other Ambulatory Visit (HOSPITAL_COMMUNITY): Payer: Self-pay | Admitting: Neurology

## 2021-11-01 DIAGNOSIS — R413 Other amnesia: Secondary | ICD-10-CM

## 2021-11-01 NOTE — Telephone Encounter (Signed)
Pt called in stating that she is taking over the counter vitamins and want to know how much she should be taking because she was low in some areas like b12.

## 2021-11-03 NOTE — Telephone Encounter (Signed)
Patient Is aware and scheduled for 4 weekly b12 injections

## 2021-11-03 NOTE — Telephone Encounter (Signed)
Reviewed labs from neurology (Dr Manuella Ghazi).  B12 is low.  Would recommend starting B12 injections 1038mcg q week x 4 weeks and then 1044mcg q month.  Folate level wnl.

## 2021-11-04 ENCOUNTER — Ambulatory Visit (INDEPENDENT_AMBULATORY_CARE_PROVIDER_SITE_OTHER): Payer: Medicare Other

## 2021-11-04 ENCOUNTER — Other Ambulatory Visit: Payer: Self-pay

## 2021-11-04 DIAGNOSIS — E538 Deficiency of other specified B group vitamins: Secondary | ICD-10-CM

## 2021-11-04 MED ORDER — CYANOCOBALAMIN 1000 MCG/ML IJ SOLN
1000.0000 ug | Freq: Once | INTRAMUSCULAR | Status: AC
  Administered 2021-11-04: 1000 ug via INTRAMUSCULAR

## 2021-11-04 NOTE — Progress Notes (Signed)
Patient presented for B 12 injection to left deltoid, patient voiced no concerns nor showed any signs of distress during injection. 

## 2021-11-08 ENCOUNTER — Other Ambulatory Visit: Payer: Self-pay

## 2021-11-08 ENCOUNTER — Ambulatory Visit (HOSPITAL_COMMUNITY)
Admission: RE | Admit: 2021-11-08 | Discharge: 2021-11-08 | Disposition: A | Discharge status: Home / Self Care | Payer: Medicare Other | Source: Ambulatory Visit | Attending: Neurology | Admitting: Neurology

## 2021-11-08 DIAGNOSIS — R413 Other amnesia: Secondary | ICD-10-CM | POA: Insufficient documentation

## 2021-11-11 ENCOUNTER — Ambulatory Visit (INDEPENDENT_AMBULATORY_CARE_PROVIDER_SITE_OTHER): Payer: Medicare Other

## 2021-11-11 ENCOUNTER — Other Ambulatory Visit: Payer: Self-pay

## 2021-11-11 DIAGNOSIS — E538 Deficiency of other specified B group vitamins: Secondary | ICD-10-CM | POA: Diagnosis not present

## 2021-11-11 MED ORDER — CYANOCOBALAMIN 1000 MCG/ML IJ SOLN
1000.0000 ug | Freq: Once | INTRAMUSCULAR | Status: AC
  Administered 2021-11-11: 1000 ug via INTRAMUSCULAR

## 2021-11-11 NOTE — Progress Notes (Signed)
Patient came in today for B-12 injection given in right deltoid IM. Patient tolerated well no signs of distress.

## 2021-11-14 ENCOUNTER — Ambulatory Visit (INDEPENDENT_AMBULATORY_CARE_PROVIDER_SITE_OTHER): Payer: Medicare Other | Admitting: Internal Medicine

## 2021-11-14 ENCOUNTER — Other Ambulatory Visit: Payer: Self-pay

## 2021-11-14 VITALS — BP 118/72 | HR 78 | Temp 97.8°F | Resp 16 | Ht 62.0 in | Wt 127.8 lb

## 2021-11-14 DIAGNOSIS — R42 Dizziness and giddiness: Secondary | ICD-10-CM

## 2021-11-14 DIAGNOSIS — K209 Esophagitis, unspecified without bleeding: Secondary | ICD-10-CM

## 2021-11-14 DIAGNOSIS — R413 Other amnesia: Secondary | ICD-10-CM

## 2021-11-14 DIAGNOSIS — E7800 Pure hypercholesterolemia, unspecified: Secondary | ICD-10-CM

## 2021-11-14 DIAGNOSIS — I10 Essential (primary) hypertension: Secondary | ICD-10-CM

## 2021-11-14 DIAGNOSIS — E039 Hypothyroidism, unspecified: Secondary | ICD-10-CM | POA: Diagnosis not present

## 2021-11-14 DIAGNOSIS — K760 Fatty (change of) liver, not elsewhere classified: Secondary | ICD-10-CM

## 2021-11-14 DIAGNOSIS — I700 Atherosclerosis of aorta: Secondary | ICD-10-CM | POA: Diagnosis not present

## 2021-11-14 DIAGNOSIS — E871 Hypo-osmolality and hyponatremia: Secondary | ICD-10-CM

## 2021-11-14 LAB — BASIC METABOLIC PANEL
BUN: 13 mg/dL (ref 6–23)
CO2: 28 mEq/L (ref 19–32)
Calcium: 10.4 mg/dL (ref 8.4–10.5)
Chloride: 99 mEq/L (ref 96–112)
Creatinine, Ser: 0.66 mg/dL (ref 0.40–1.20)
GFR: 81.98 mL/min (ref >60.00)
Glucose, Bld: 103 mg/dL — ABNORMAL HIGH (ref 70–99)
Potassium: 3.9 mEq/L (ref 3.5–5.1)
Sodium: 136 mEq/L (ref 135–145)

## 2021-11-14 LAB — HEPATIC FUNCTION PANEL
ALT: 20 U/L (ref 0–35)
AST: 16 U/L (ref 0–37)
Albumin: 4.4 g/dL (ref 3.5–5.2)
Alkaline Phosphatase: 49 U/L (ref 39–117)
Bilirubin, Direct: 0.2 mg/dL (ref 0.0–0.3)
Total Bilirubin: 1.3 mg/dL — ABNORMAL HIGH (ref 0.2–1.2)
Total Protein: 6.3 g/dL (ref 6.0–8.3)

## 2021-11-14 LAB — LIPID PANEL
Cholesterol: 143 mg/dL (ref 0–200)
HDL: 49.1 mg/dL (ref >39.00)
LDL Cholesterol: 63 mg/dL (ref 0–99)
NonHDL: 94.01
Total CHOL/HDL Ratio: 3
Triglycerides: 154 mg/dL — ABNORMAL HIGH (ref 0.0–149.0)
VLDL: 30.8 mg/dL (ref 0.0–40.0)

## 2021-11-14 LAB — TSH: TSH: 2.06 u[IU]/mL (ref 0.35–5.50)

## 2021-11-14 MED ORDER — PANTOPRAZOLE SODIUM 40 MG PO TBEC: DELAYED_RELEASE_TABLET | ORAL | 1 refills | Status: DC

## 2021-11-14 NOTE — Progress Notes (Signed)
Patient ID: Crystal Lynch, female   DOB: 01-26-1941, 81 y.o.   MRN: [id number]   Subjective:    Patient ID: Crystal Lynch, female    DOB: 10-14-1941, 81 y.o.   MRN: [id number]  This visit occurred during the SARS-CoV-2 public health emergency.  Safety protocols were in place, including screening questions prior to the visit, additional usage of staff PPE, and extensive cleaning of exam room while observing appropriate contact time as indicated for disinfecting solutions.   Patient here for a scheduled follow up.  Marland Kitchen   HPI Here to follow up regarding imbalance issues and memory issues.  She is accompanied by her husband.  History obtained from both of them. Saw neurology 11/01/21.  MRI ordered.  Results:  Parenchymal volume loss and mild to moderate chronic microvascular ischemic changes and metabolic changes.  Discussed results.  Speech therapy consulted - for cognitive training.  Saw ENT:  Dizziness and imbalance, status post vestibular therapy and ENT evaluation - 39% left caloric weakness noted on VNG test.  Felt symptoms were more related to balance and fear of falling.  Recommended HST, aspirin and continuing crestor.  Did recommend lexapro for anxiety.  She is not taking.  No chest pain or sob reported.  Protonix - for acid reflux.  No cough or congestion.  No abdominal pain.  Bowels moving.     Past Medical History:  Diagnosis Date   Arthritis    Chicken pox    Endometriosis    Esophagitis    s/p esophageal dilatation   GERD (gastroesophageal reflux disease)    History of ovarian cyst    Hypercholesterolemia    Hypertension    Hypothyroidism    Past Surgical History:  Procedure Laterality Date   ABDOMINAL SURGERY     found to have an ovarian cyst and endometriosis   APPENDECTOMY     Family History  Problem Relation Age of Onset   Lung disease Father    Heart attack Father    Heart disease Mother        myocardial infarction   Breast cancer Mother 52   Heart attack  Mother    Hypertension Mother    Hyperlipidemia Mother    Breast cancer Sister 57   Hypertension Sister    Rheum arthritis Sister    Breast cancer Maternal Aunt 55   Hypertension Brother    Colon cancer Neg Hx    Social History   Socioeconomic History   Marital status: Married    Spouse name: Not on file   Number of children: Not on file   Years of education: Not on file   Highest education level: Not on file  Occupational History   Not on file  Tobacco Use   Smoking status: Never   Smokeless tobacco: Never  Vaping Use   Vaping Use: Never used  Substance and Sexual Activity   Alcohol use: No    Alcohol/week: 0.0 standard drinks   Drug use: No   Sexual activity: Never  Other Topics Concern   Not on file  Social History Narrative   Not on file   Social Determinants of Health   Financial Resource Strain: Low Risk    Difficulty of Paying Living Expenses: Not hard at all  Food Insecurity: No Food Insecurity   Worried About Charity fundraiser in the Last Year: Never true   Ran Out of Food in the Last Year: Never true  Transportation Needs: No Transportation Needs  Lack of Transportation (Medical): No   Lack of Transportation (Non-Medical): No  Physical Activity: Sufficiently Active   Days of Exercise per Week: 5 days   Minutes of Exercise per Session: 30 min  Stress: No Stress Concern Present   Feeling of Stress : Not at all  Social Connections: Unknown   Frequency of Communication with Friends and Family: More than three times a week   Frequency of Social Gatherings with Friends and Family: More than three times a week   Attends Religious Services: More than 4 times per year   Active Member of [employer] or Organizations: Not on file   Attends Archivist Meetings: Not on file   Marital Status: Married     Review of Systems  Constitutional:  Negative for appetite change and unexpected weight change.  HENT:  Negative for congestion and sinus pressure.    Respiratory:  Negative for cough, chest tightness and shortness of breath.   Cardiovascular:  Negative for chest pain, palpitations and leg swelling.  Gastrointestinal:  Negative for abdominal pain, diarrhea, nausea and vomiting.  Genitourinary:  Negative for difficulty urinating and dysuria.  Musculoskeletal:  Negative for joint swelling and myalgias.  Skin:  Negative for color change and rash.  Neurological:  Negative for headaches.       Unsteadiness / dizziness as outlined.    Psychiatric/Behavioral:  Negative for agitation and dysphoric mood.       Objective:     BP 118/72    Pulse 78    Temp 97.8 F (36.6 C)    Resp 16    Ht 5\' 2"  (1.575 m)    Wt 127 lb 12.8 oz (58 kg)    LMP 10/21/1984    SpO2 98%    BMI 23.37 kg/m  Wt Readings from Last 3 Encounters:  11/14/21 127 lb 12.8 oz (58 kg)  09/09/21 131 lb (59.4 kg)  07/15/21 127 lb 12.8 oz (58 kg)    Physical Exam Vitals reviewed.  Constitutional:      General: She is not in acute distress.    Appearance: Normal appearance.  HENT:     Head: Normocephalic and atraumatic.     Right Ear: External ear normal.     Left Ear: External ear normal.  Eyes:     General: No scleral icterus.       Right eye: No discharge.        Left eye: No discharge.     Conjunctiva/sclera: Conjunctivae normal.  Neck:     Thyroid: No thyromegaly.  Cardiovascular:     Rate and Rhythm: Normal rate and regular rhythm.  Pulmonary:     Effort: No respiratory distress.     Breath sounds: Normal breath sounds. No wheezing.  Abdominal:     General: Bowel sounds are normal.     Palpations: Abdomen is soft.     Tenderness: There is no abdominal tenderness.  Musculoskeletal:        General: No swelling or tenderness.     Cervical back: Neck supple. No tenderness.  Lymphadenopathy:     Cervical: No cervical adenopathy.  Skin:    Findings: No erythema or rash.  Neurological:     Mental Status: She is alert.  Psychiatric:        Mood and Affect:  Mood normal.        Behavior: Behavior normal.     Outpatient Encounter Medications as of 11/14/2021  Medication Sig   aspirin 81 MG EC  tablet Take 1 tablet (81 mg total) by mouth daily. Swallow whole.   Biotin 5 MG CAPS Take 5,000 mcg by mouth daily.   carvedilol (COREG) 6.25 MG tablet TAKE 1 TABLET TWICE A DAY   cholecalciferol (VITAMIN D) 25 MCG (1000 UNIT) tablet Take 1,000 Units by mouth daily.   dorzolamide-timolol (COSOPT) 22.3-6.8 MG/ML ophthalmic solution Place 1 drop into both eyes 2 (two) times daily.   levothyroxine (SYNTHROID) 75 MCG tablet Take 1 tablet (75 mcg total) by mouth daily.   lisinopril (ZESTRIL) 20 MG tablet Take 1 tablet (20 mg total) by mouth daily.   Multiple Vitamins-Minerals (ZINC PO) Take 1 tablet by mouth daily.   pantoprazole (PROTONIX) 40 MG tablet TAKE 1 TABLET BY MOUTH EVERY DAY   rosuvastatin (CRESTOR) 10 MG tablet Take 1 tablet (10 mg total) by mouth daily.   spironolactone (ALDACTONE) 25 MG tablet Take 0.5 tablets (12.5 mg total) by mouth daily.   [DISCONTINUED] pantoprazole (PROTONIX) 40 MG tablet TAKE 1 TABLET BY MOUTH EVERY DAY(90 DAY FILL EVERY 365 DAYS. NF DUE ON 02/2022)   No facility-administered encounter medications on file as of 11/14/2021.     Lab Results  Component Value Date   WBC 7.9 03/02/2021   HGB 14.4 03/02/2021   HCT 42.4 03/02/2021   PLT 297.0 03/02/2021   GLUCOSE 103 (H) 11/14/2021   CHOL 143 11/14/2021   TRIG 154.0 (H) 11/14/2021   HDL 49.10 11/14/2021   LDLDIRECT 88.0 03/22/2021   LDLCALC 63 11/14/2021   ALT 20 11/14/2021   AST 16 11/14/2021   NA 136 11/14/2021   K 3.9 11/14/2021   CL 99 11/14/2021   CREATININE 0.66 11/14/2021   BUN 13 11/14/2021   CO2 28 11/14/2021   TSH 2.06 11/14/2021   INR 1.0 08/12/2015    MR BRAIN WO CONTRAST  Result Date: 11/10/2021 CLINICAL DATA:  Memory loss EXAM: MRI HEAD WITHOUT CONTRAST TECHNIQUE: Multiplanar, multiecho pulse sequences of the brain and surrounding structures were  obtained without intravenous contrast. Additionally, using NeuroQuant software a 3D volumetric analysis of the brain was performed and is compared to a normative database adjusted for age, gender and intracranial volume. COMPARISON:  None. FINDINGS: Brain: No acute infarction or hemorrhage. No intracranial mass, mass effect, edema, hydrocephalus, or extra-axial collection. Prominence of the ventricles and sulci reflects parenchymal volume loss. Patchy T2 hyperintensity in the supratentorial white matter is nonspecific but may reflect mild to moderate chronic microvascular ischemic changes. Vascular: Major vessel flow voids at the skull base are preserved. Skull and upper cervical spine: Marrow signal is within normal limits. Sinuses/Orbits: Minor mucosal thickening.  Orbits are unremarkable. Other: Mastoid air cells are clear. NeuroQuant Findings: Volumetric analysis of the brain was performed, with a fully detailed report in [facility]. Briefly, the comparison with age and gender matched reference reveals hippocampal volume at the nineteenth percentile. IMPRESSION: 1. Parenchymal volume loss and mild to moderate chronic microvascular ischemic changes. 2. NeuroQuant volumetric analysis of the brain, see details on PACS. Electronically Signed   By: Macy Mis M.D.   On: 11/10/2021 12:33       Assessment & Plan:   Problem List Items Addressed This Visit     Aortic atherosclerosis ([place])    Continue crestor.       Relevant Medications   aspirin 81 MG EC tablet   Dizziness    Persistent issue.  Evaluated by ENT.  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  Recommended vestibular rehab and evaluation by  neurology.  Neurology evaluation as outlined.  MRI as outlined.  Describes more unsteadiness and fear of falling.  Discussed PT for core strengthening, balance and gait issues.        Esophagitis    Protonix.        Fatty liver    Found on ultrasound.  Follow liver function tests.         Hypercholesterolemia - Primary    Low cholesterol diet and exercise.  Continue crestor.  Follow lipid panel and liver function tests.        Relevant Medications   aspirin 81 MG EC tablet   Other Relevant Orders   Basic metabolic panel (Completed)   Lipid panel (Completed)   Hepatic function panel (Completed)   Hypertension    Blood pressure overall has been doing better.  She remains on lisinopril, Coreg, and Aldactone.        Relevant Medications   aspirin 81 MG EC tablet   Hyponatremia    Follow sodium level.       Hypothyroidism    On thyroid replacement.  Follow tsh.       Relevant Orders   TSH (Completed)   Memory change    Recommended speech therapy - for cognitive training.          Einar Pheasant, MD

## 2021-11-15 ENCOUNTER — Other Ambulatory Visit: Payer: Self-pay | Admitting: Internal Medicine

## 2021-11-16 ENCOUNTER — Other Ambulatory Visit: Payer: Self-pay | Admitting: Internal Medicine

## 2021-11-16 NOTE — Progress Notes (Signed)
Order placed for abdominal ultrasound.   

## 2021-11-17 ENCOUNTER — Other Ambulatory Visit: Payer: Self-pay | Admitting: Internal Medicine

## 2021-11-18 ENCOUNTER — Telehealth: Payer: Self-pay | Admitting: Internal Medicine

## 2021-11-18 ENCOUNTER — Ambulatory Visit (INDEPENDENT_AMBULATORY_CARE_PROVIDER_SITE_OTHER): Payer: Medicare Other | Admitting: *Deleted

## 2021-11-18 ENCOUNTER — Other Ambulatory Visit: Payer: Self-pay

## 2021-11-18 DIAGNOSIS — E538 Deficiency of other specified B group vitamins: Secondary | ICD-10-CM | POA: Diagnosis not present

## 2021-11-18 MED ORDER — CYANOCOBALAMIN 1000 MCG/ML IJ SOLN
1000.0000 ug | Freq: Once | INTRAMUSCULAR | Status: AC
  Administered 2021-11-18: 1000 ug via INTRAMUSCULAR

## 2021-11-18 NOTE — Progress Notes (Signed)
Patient presented for B 12 injection to left deltoid, patient voiced no concerns nor showed any signs of distress during injection. 

## 2021-11-18 NOTE — Telephone Encounter (Signed)
Lft pt vm to call ofc . Thanks  

## 2021-11-18 NOTE — Telephone Encounter (Signed)
Patient returned referrals phone call. 

## 2021-11-18 NOTE — Telephone Encounter (Signed)
Pt returning call

## 2021-11-21 ENCOUNTER — Other Ambulatory Visit: Payer: Self-pay | Admitting: Cardiovascular Disease

## 2021-11-24 ENCOUNTER — Ambulatory Visit: Payer: Medicare Other | Attending: Internal Medicine | Admitting: Speech Pathology

## 2021-11-24 ENCOUNTER — Encounter: Payer: Self-pay | Admitting: Speech Pathology

## 2021-11-24 ENCOUNTER — Other Ambulatory Visit: Payer: Self-pay

## 2021-11-24 ENCOUNTER — Telehealth: Payer: Self-pay | Admitting: Internal Medicine

## 2021-11-24 DIAGNOSIS — R42 Dizziness and giddiness: Secondary | ICD-10-CM | POA: Insufficient documentation

## 2021-11-24 DIAGNOSIS — R2689 Other abnormalities of gait and mobility: Secondary | ICD-10-CM | POA: Diagnosis present

## 2021-11-24 DIAGNOSIS — M6281 Muscle weakness (generalized): Secondary | ICD-10-CM | POA: Diagnosis present

## 2021-11-24 DIAGNOSIS — R2681 Unsteadiness on feet: Secondary | ICD-10-CM | POA: Insufficient documentation

## 2021-11-24 DIAGNOSIS — R41841 Cognitive communication deficit: Secondary | ICD-10-CM | POA: Diagnosis not present

## 2021-11-24 DIAGNOSIS — R300 Dysuria: Secondary | ICD-10-CM

## 2021-11-24 DIAGNOSIS — R278 Other lack of coordination: Secondary | ICD-10-CM | POA: Insufficient documentation

## 2021-11-24 NOTE — Therapy (Signed)
[place] [facility]IN [facility] [address] [place], [place], [address] Phone: [phone]   Fax:  [phone]  Speech Language Pathology Evaluation  Patient Details  Name: Crystal Lynch MRN: [id number] Date of Birth: 1941/09/27 Referring Provider (SLP): Dr. Manuella Ghazi   Encounter Date: 11/24/2021   End of Session - 11/24/21 1915     Visit Number 1    Number of Visits 12    Date for SLP Re-Evaluation 02/22/22    Authorization Type Medicare    Authorization - Visit Number 1    Progress Note Due on Visit 10    SLP Start Time 1400    SLP Stop Time  1500    SLP Time Calculation (min) 60 min    Activity Tolerance Patient tolerated treatment well             Past Medical History:  Diagnosis Date   Arthritis    Chicken pox    Endometriosis    Esophagitis    s/p esophageal dilatation   GERD (gastroesophageal reflux disease)    History of ovarian cyst    Hypercholesterolemia    Hypertension    Hypothyroidism     Past Surgical History:  Procedure Laterality Date   ABDOMINAL SURGERY     found to have an ovarian cyst and endometriosis   APPENDECTOMY      There were no vitals filed for this visit.   Subjective Assessment - 11/24/21 1407     Subjective "I'm not sure why I'm here."    Patient is accompained by: Family member   Husband [name]   Currently in Pain? Yes    Pain Score 4     Pain Location Leg    Pain Orientation Right;Left;Upper    Pain Descriptors / Indicators Aching                SLP Evaluation [facility] - 11/24/21 1407       SLP Visit Information   SLP Received On 11/24/21    Referring Provider (SLP) Dr. Manuella Ghazi    Onset Date 11/15/21    Medical Diagnosis memory loss      General Information   HPI Crystal Lynch is an 81 y.o. female with past medical history including HTN, hypothyroidism, GERD, anxiety, dizziness/imbalance, left-sided facial weakness with concern for silent stroke referred by Dr. Manuella Ghazi for memory  loss. MRI on 11/08/21 with no acute infarction or hemmorhage. MD noted generalized atrophy and mild to moderate white matter microvascular ischemic and metabolic changes.    Behavioral/Cognition alert, cooperative    Mobility Status ambulated with cane      Balance Screen   Has the patient fallen in the past 6 months Yes    How many times? 2    Has the patient had a decrease in activity level because of a fear of falling?  No    Is the patient reluctant to leave their home because of a fear of falling?  No      Prior Functional Status   Cognitive/Linguistic Baseline Within functional limits     Lives With Spouse    Available Support Family;Available PRN/intermittently    Vocation Retired      Associate Professor   Overall Cognitive Status Impaired/Different from baseline    Attention Selective    Selective Attention Impaired    Selective Attention Impairment Verbal basic;Functional basic   symbol cancellation impaired; pt has difficulty attending to conversation   Memory Impaired    Memory  Impairment Decreased short term memory    Decreased Short Term Memory Verbal basic;Functional basic   forgets details from conversations, spouse reports pt "confusion" forgetting where she is going, or where she is supposed to turn   Awareness Impaired    Awareness Impairment Emergent impairment    Problem Solving Impaired    Executive Function Organizing;Reasoning   clock drawing impaired     Auditory Comprehension   Overall Auditory Comprehension Other (comment)   may assess further; pt appears to have difficulty comprehending multistep instructions/more complex conversation   Interfering Components Attention;Processing speed;Working memory   ?hearing; pt denies difficulties     Reading Comprehension   Reading Status Not tested   Pt report she has difficulty focusing on what she's reading     Expression   Primary Mode of Expression Verbal      Verbal Expression   Overall Verbal Expression Impaired     Initiation No impairment    Automatic Speech Name;Social Response    Level of Generative/Spontaneous Verbalization Conversation    Naming Impairment    Confrontation 75-100% accurate   10/10   Divergent --   3 m words in 60 seconds   Other Naming Comments perseveration 2/12 words    Pragmatics No impairment      Written Expression   Dominant Hand Right    Written Expression Not tested      Oral Motor/Sensory Function   Overall Oral Motor/Sensory Function Impaired    Labial ROM Within Functional Limits    Labial Symmetry Within Functional Limits    Labial Strength Within Functional Limits    Labial Coordination WFL    Lingual ROM Within Functional Limits    Lingual Symmetry Within Functional Limits    Lingual Strength Within Functional Limits    Facial ROM Reduced left   pt reports hx Bell's Palsy several years ago   Velum Within Functional Limits    Mandible Within Functional Limits      Motor Speech   Overall Motor Speech Appears within functional limits for tasks assessed      Standardized Assessments   Standardized Assessments  Cognitive Linguistic Quick Test              Cognitive Linguistic Quick Test: AGE - 18 - 69   The Cognitive Linguistic Quick Test (CLQT) was administered to assess the relative status of five cognitive domains: attention, memory, language, executive functioning, and visuospatial skills. Scores from 10 tasks were used to estimate severity ratings (standardized for age groups 18-69 years and 70-89 years) for each domain, a clock drawing task, as well as an overall composite severity rating of cognition.       Task Score Criterion Cut Scores  Personal Facts 8/8 8  Symbol Cancellation 5/12 11  Confrontation Naming 10/10 10  Clock Drawing  7/13 12  Story Retelling 5/10 6  Symbol Trails 6/10 9  Generative Naming 3/9 5  Design Memory 4/6 5  Mazes  3/8 7  Design Generation 6/13 6    Cognitive Domain Composite Score Severity Rating   Attention 99/215 Moderate  Memory 129/185 Mild  Executive Function 18/40 Mild  Language 26/37 Mild  Visuospatial Skills 53/105 Mild  Clock Drawing  7/13 Moderate  Composite Severity Rating  Mild           SLP Education - 11/24/21 1915     Education Details Pt scored below WNL for age, role of speech therapy, consider neuropsychology assessment    Person(s) Educated  Patient;Spouse    Methods Explanation    Comprehension Verbalized understanding              SLP Short Term Goals - 11/24/21 1925       SLP SHORT TERM GOAL #1   Title Patient will establish external aid for memory/executive function and bring to more than 75% of therapy sessions.    Time 4    Period Weeks    Status New      SLP SHORT TERM GOAL #2   Title Pt will demonstrate awareness of attention/processing breakdowns in conversation 3/5 opportunities.    Time 4    Period Weeks    Status New              SLP Long Term Goals - 11/24/21 1928       SLP LONG TERM GOAL #1   Title Patient will use system for executive function and recall to manage appointments, daily tasks, and medical information >80% accuracy with min cues.    Time 12    Period Weeks    Status New    Target Date 02/22/22      SLP LONG TERM GOAL #2   Baseline Patient will request rephrasing/repetition when attention/processing breakdowns in conversation breakdowns occur >80% of the time    Time 12    Period Weeks    Status New    Target Date 02/22/22      SLP LONG TERM GOAL #3   Title Pt will verbalize and demonstrate how to implement 2 memory and attention strategies to aid daily functioning given occasional min A over 2 sessions    Time 12    Period Weeks    Status New    Target Date 02/22/22      SLP LONG TERM GOAL #4   Title Patient will complete daily cognitive linguistic exercises 5/7 days.    Time 12    Period Weeks    Status New    Target Date 02/22/22              Plan - 11/24/21 1930      Clinical Impression Statement Patient presents with overall mild-moderate cognitive communication deficits per standardized cognitive testing via CLQT. Pt scored in "mild" range for age in memory, executive functions, language, and visuospatial skills, and "moderate" range for clock drawing and attention. Patient reports difficulty with short term memory, as well as trouble paying attention to conversations, her circle meeting and sermon at church. Spouse reports pt "getting confused where she is," and being in a familiar place and "not sure which way to go." Per neuro notes, concern for neurodegenerative process. Discussed benefits of neuropsychological evaluation; pt unsure of whether she would like to pursue this. Spouse reports pt has reduced confidence in her balance and walking; they would like referral for PT to work on balance. I recommend brief course of ST to train pt in compensations to improve participation and satisfaction with communication with friends, family and in the community, as well as to train pt/spouse in principles of neuroplasticity and benefits of a cognitive home program.    Speech Therapy Frequency 1x /week    Duration 12 weeks    Treatment/Interventions Language facilitation;Cueing hierarchy;SLP instruction and feedback;Compensatory techniques;Cognitive reorganization;Functional tasks;Compensatory strategies;Internal/external aids;Multimodal communcation approach;Patient/family education    Potential to Achieve Goals Fair   -good   Consulted and Agree with Plan of Care Patient             Patient  will benefit from skilled therapeutic intervention in order to improve the following deficits and impairments:   Cognitive communication deficit    Problem List Patient Active Problem List   Diagnosis Date Noted   Fatigue 07/23/2021   Aortic atherosclerosis ([place]) 04/17/2021   Right acute serous otitis media    Hyponatremia 02/23/2021   Prolonged QT interval 02/23/2021    Dizziness 09/12/2020   Fall 03/08/2020   Fatty liver 12/28/2019   Chest pain 07/25/2019   Frequent urinary tract infections 12/24/2018   Acute cystitis without hematuria 10/11/2018   Hypercalcemia 01/09/2017   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 SERVICES [address] [place], [place], [address] Phone: [phone]   Fax:  [phone]  Name: Crystal Lynch MRN: [id number] Date of Birth: [birth year]

## 2021-11-24 NOTE — Telephone Encounter (Signed)
LMTCB

## 2021-11-24 NOTE — Telephone Encounter (Signed)
Pt called in stating that she may have a UTI. Pt stated that when she use the bathroom she feel symptoms of UTI. Pt would like to know if Dr. Nicki Reaper can place an urinalysis lab order.No urinalysis order are place in system. Pt stated that she have a appt tomorrow for B12 and was wondering if she can do the urine sample at the same time as the B12. Pt requesting callback

## 2021-11-24 NOTE — Telephone Encounter (Signed)
Patient called in ,stated returning the call, Please call patient @ [phone]

## 2021-11-25 ENCOUNTER — Ambulatory Visit (INDEPENDENT_AMBULATORY_CARE_PROVIDER_SITE_OTHER): Payer: Medicare Other | Admitting: Internal Medicine

## 2021-11-25 ENCOUNTER — Ambulatory Visit (INDEPENDENT_AMBULATORY_CARE_PROVIDER_SITE_OTHER): Payer: Medicare Other | Admitting: *Deleted

## 2021-11-25 DIAGNOSIS — R42 Dizziness and giddiness: Secondary | ICD-10-CM

## 2021-11-25 DIAGNOSIS — N3000 Acute cystitis without hematuria: Secondary | ICD-10-CM

## 2021-11-25 DIAGNOSIS — R300 Dysuria: Secondary | ICD-10-CM

## 2021-11-25 DIAGNOSIS — I10 Essential (primary) hypertension: Secondary | ICD-10-CM

## 2021-11-25 LAB — POCT URINALYSIS DIPSTICK
Bilirubin, UA: NEGATIVE
Glucose, UA: NEGATIVE
Ketones, UA: NEGATIVE
Nitrite, UA: POSITIVE
Protein, UA: POSITIVE — AB
Spec Grav, UA: 1.02 (ref 1.010–1.025)
Urobilinogen, UA: 0.2 E.U./dL
pH, UA: 5.5 (ref 5.0–8.0)

## 2021-11-25 LAB — URINALYSIS, MICROSCOPIC ONLY

## 2021-11-25 MED ORDER — CEFDINIR 300 MG PO CAPS: 300.0000 mg | ORAL_CAPSULE | Freq: Two times a day (BID) | ORAL | 0 refills | Status: DC

## 2021-11-25 NOTE — Progress Notes (Signed)
Patient ID: Crystal Lynch, female   DOB: April 07, 1941, 81 y.o.   MRN: [id number]   Virtual Visit via telephone Note  This visit type was conducted due to national recommendations for restrictions regarding the COVID-19 pandemic (e.g. social distancing).  This format is felt to be most appropriate for this patient at this time.  All issues noted in this document were discussed and addressed.  No physical exam was performed (except for noted visual exam findings with Video Visits).   I connected with [name] by telephone and verified that I am speaking with the correct person using two identifiers. Location patient: home Location provider: work Persons participating in the telephone visit: patient, provider  The limitations, risks, security and privacy concerns of performing an evaluation and management service by telephone and the availability of in person appointments have been discussed.  It has also been discussed with the patient that there may be a patient responsible charge related to this service. The patient expressed understanding and agreed to proceed.   Reason for visit: work in appt  HPI: Work in with concerns regarding UTI.  States that symptoms started first of week.  Noticed dysuria and increased frequency.  No hematuria.  Some low back discomfort - aching.  No fever.  No nausea or vomiting.  Eating.  Trying to stay hydrated.  No chest pain or sob reported.  Had speech therapy today and per pt, they discussed memory, etc.  Interested in pursuing therapy for her balance.  Discussed.     ROS: See pertinent positives and negatives per HPI.  Past Medical History:  Diagnosis Date   Arthritis    Chicken pox    Endometriosis    Esophagitis    s/p esophageal dilatation   GERD (gastroesophageal reflux disease)    History of ovarian cyst    Hypercholesterolemia    Hypertension    Hypothyroidism     Past Surgical History:  Procedure Laterality Date   ABDOMINAL SURGERY      found to have an ovarian cyst and endometriosis   APPENDECTOMY      Family History  Problem Relation Age of Onset   Lung disease Father    Heart attack Father    Heart disease Mother        myocardial infarction   Breast cancer Mother 77   Heart attack Mother    Hypertension Mother    Hyperlipidemia Mother    Breast cancer Sister 64   Hypertension Sister    Rheum arthritis Sister    Breast cancer Maternal Aunt 50   Hypertension Brother    Colon cancer Neg Hx     SOCIAL HX: reviewed.    Current Outpatient Medications:    cefdinir (OMNICEF) 300 MG capsule, Take 1 capsule (300 mg total) by mouth 2 (two) times daily., Disp: 10 capsule, Rfl: 0   Biotin 5 MG CAPS, Take 5,000 mcg by mouth daily., Disp: , Rfl:    carvedilol (COREG) 6.25 MG tablet, TAKE 1 TABLET TWICE A DAY, Disp: 180 tablet, Rfl: 0   cholecalciferol (VITAMIN D) 25 MCG (1000 UNIT) tablet, Take 1,000 Units by mouth daily., Disp: , Rfl:    dorzolamide-timolol (COSOPT) 22.3-6.8 MG/ML ophthalmic solution, Place 1 drop into both eyes 2 (two) times daily., Disp: , Rfl:    levothyroxine (SYNTHROID) 75 MCG tablet, Take 1 tablet (75 mcg total) by mouth daily., Disp: 90 tablet, Rfl: 1   lisinopril (ZESTRIL) 20 MG tablet, Take 1 tablet (20 mg total)  by mouth daily., Disp: , Rfl:    Multiple Vitamins-Minerals (ZINC PO), Take 1 tablet by mouth daily., Disp: , Rfl:    pantoprazole (PROTONIX) 40 MG tablet, TAKE 1 TABLET BY MOUTH EVERY DAY, Disp: 90 tablet, Rfl: 1   rosuvastatin (CRESTOR) 10 MG tablet, Take 1 tablet (10 mg total) by mouth daily., Disp: 90 tablet, Rfl: 1   spironolactone (ALDACTONE) 25 MG tablet, Take 0.5 tablets (12.5 mg total) by mouth daily., Disp: 45 tablet, Rfl: 1  EXAM:   GENERAL: alert. Sounds to be in no acute distress.  Answering questions appropriately.   PSYCH/NEURO: pleasant and cooperative, no obvious depression or anxiety, speech and thought processing grossly intact  ASSESSMENT AND  PLAN:  Discussed the following assessment and plan:  Problem List Items Addressed This Visit     Dizziness    Persistent issue.  Evaluated by ENT.  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  Recommended vestibular rehab.  Still with unsteadiness.  Discussed therapy.       Hypertension    Continue spironolactone, coreg and lisinopril. Follow pressures.        UTI (urinary tract infection)    Symptoms and urine dip appear to be c/w UTI.  Stay hydrated.  Treat with omnicef.  Follow.  Call with update.  Await urine culture results.        Relevant Medications   cefdinir (OMNICEF) 300 MG capsule    Return if symptoms worsen or fail to improve.   I discussed the assessment and treatment plan with the patient. The patient was provided an opportunity to ask questions and all were answered. The patient agreed with the plan and demonstrated an understanding of the instructions.   The patient was advised to call back or seek an in-person evaluation if the symptoms worsen or if the condition fails to improve as anticipated.  I provided 24 minutes of non-face-to-face time during this encounter.   Einar Pheasant, MD

## 2021-11-25 NOTE — Telephone Encounter (Signed)
Spoke with patient. She is going to leave sample when she comes in and is agreeable to do virtual/telephone appt. Orders placed and urine cup up front.

## 2021-11-26 ENCOUNTER — Other Ambulatory Visit: Payer: Self-pay | Admitting: Internal Medicine

## 2021-11-26 DIAGNOSIS — E039 Hypothyroidism, unspecified: Secondary | ICD-10-CM

## 2021-11-27 ENCOUNTER — Encounter: Payer: Self-pay | Admitting: Internal Medicine

## 2021-11-27 DIAGNOSIS — R413 Other amnesia: Secondary | ICD-10-CM | POA: Insufficient documentation

## 2021-11-27 DIAGNOSIS — F0390 Unspecified dementia without behavioral disturbance: Secondary | ICD-10-CM | POA: Insufficient documentation

## 2021-11-27 LAB — URINE CULTURE
MICRO NUMBER:: 12961096
SPECIMEN QUALITY:: ADEQUATE

## 2021-11-27 MED ORDER — ASPIRIN 81 MG PO TBEC: 81.0000 mg | DELAYED_RELEASE_TABLET | Freq: Every day | ORAL | 0 refills | Status: DC

## 2021-11-27 NOTE — Assessment & Plan Note (Signed)
Symptoms and urine dip appear to be c/w UTI.  Stay hydrated.  Treat with omnicef.  Follow.  Call with update.  Await urine culture results.

## 2021-11-27 NOTE — Assessment & Plan Note (Signed)
Blood pressure overall has been doing better.  She remains on lisinopril, Coreg, and Aldactone.

## 2021-11-27 NOTE — Assessment & Plan Note (Signed)
On thyroid replacement.  Follow tsh.  

## 2021-11-27 NOTE — Assessment & Plan Note (Signed)
Continue crestor 

## 2021-11-27 NOTE — Assessment & Plan Note (Signed)
Follow sodium level.

## 2021-11-27 NOTE — Assessment & Plan Note (Signed)
Low cholesterol diet and exercise.  Continue crestor.  Follow lipid panel and liver function tests.   

## 2021-11-27 NOTE — Assessment & Plan Note (Signed)
Found on ultrasound.  Follow liver function tests.   

## 2021-11-27 NOTE — Assessment & Plan Note (Signed)
Continue spironolactone, coreg and lisinopril. Follow pressures.

## 2021-11-27 NOTE — Assessment & Plan Note (Signed)
Protonix.  ?

## 2021-11-27 NOTE — Assessment & Plan Note (Signed)
Persistent issue.  Evaluated by ENT.  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  Recommended vestibular rehab and evaluation by neurology.  Neurology evaluation as outlined.  MRI as outlined.  Describes more unsteadiness and fear of falling.  Discussed PT for core strengthening, balance and gait issues.

## 2021-11-27 NOTE — Assessment & Plan Note (Signed)
Recommended speech therapy - for cognitive training.

## 2021-11-27 NOTE — Assessment & Plan Note (Signed)
Persistent issue.  Evaluated by ENT.  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  Recommended vestibular rehab.  Still with unsteadiness.  Discussed therapy.

## 2021-11-28 ENCOUNTER — Ambulatory Visit: Payer: Medicare Other | Admitting: Speech Pathology

## 2021-11-29 ENCOUNTER — Other Ambulatory Visit: Payer: Self-pay

## 2021-11-29 ENCOUNTER — Ambulatory Visit
Admission: RE | Admit: 2021-11-29 | Discharge: 2021-11-29 | Disposition: A | Discharge status: Home / Self Care | Payer: Medicare Other | Source: Ambulatory Visit | Attending: Internal Medicine | Admitting: Internal Medicine

## 2021-11-29 ENCOUNTER — Telehealth: Payer: Medicare Other | Admitting: Internal Medicine

## 2021-11-29 ENCOUNTER — Other Ambulatory Visit: Payer: Self-pay | Admitting: Internal Medicine

## 2021-11-30 ENCOUNTER — Telehealth: Payer: Self-pay | Admitting: Cardiovascular Disease

## 2021-11-30 ENCOUNTER — Telehealth: Payer: Self-pay | Admitting: Internal Medicine

## 2021-11-30 ENCOUNTER — Other Ambulatory Visit: Payer: Self-pay

## 2021-11-30 ENCOUNTER — Ambulatory Visit: Payer: Medicare Other | Admitting: Speech Pathology

## 2021-11-30 DIAGNOSIS — R41841 Cognitive communication deficit: Secondary | ICD-10-CM

## 2021-11-30 MED ORDER — LISINOPRIL 20 MG PO TABS: 20.0000 mg | ORAL_TABLET | Freq: Every day | ORAL | 3 refills | Status: DC

## 2021-11-30 NOTE — Telephone Encounter (Signed)
°*  STAT* If patient is at the pharmacy, call can be transferred to refill team.   1. Which medications need to be refilled? (please list name of each medication and dose if known) lisinopril 20 MG 1 tablet daily   2. Which pharmacy/location (including street and city if local pharmacy) is medication to be sent to? [facility] on [address]   3. Do they need a 30 day or 90 day supply? 90 day

## 2021-11-30 NOTE — Telephone Encounter (Signed)
I called physical therapy at [facility] [phone]) and left message.  Crystal Lynch is going to therapy for speech therapy.  I would like to get her set up for therapy for her balance/gait.  I left a message for them to call back and let me know what we need to do to get this scheduled.

## 2021-11-30 NOTE — Therapy (Signed)
Crystal Lynch MAIN [facility] SERVICES [address] [place], [place], [address] Phone: [phone]   Fax:  [phone]  Speech Language Pathology Treatment  Patient Details  Name: Crystal Lynch MRN: [id number] Date of Birth: 02-15-1941 Referring Provider (SLP): Dr. Manuella Ghazi   Encounter Date: 11/30/2021   End of Session - 11/30/21 1553     Visit Number 2    Number of Visits 12    Date for SLP Re-Evaluation 02/22/22    Authorization Type Medicare    Authorization - Visit Number 2    Progress Note Due on Visit 10    SLP Start Time 1400    SLP Stop Time  1500    SLP Time Calculation (min) 60 min    Activity Tolerance Patient tolerated treatment well             Past Medical History:  Diagnosis Date   Arthritis    Chicken pox    Endometriosis    Esophagitis    s/p esophageal dilatation   GERD (gastroesophageal reflux disease)    History of ovarian cyst    Hypercholesterolemia    Hypertension    Hypothyroidism     Past Surgical History:  Procedure Laterality Date   ABDOMINAL SURGERY     found to have an ovarian cyst and endometriosis   APPENDECTOMY      There were no vitals filed for this visit.   Subjective Assessment - 11/30/21 1537     Subjective "well I wasn't suppose to be in speech therapy though she Larena Glassman) though she might be able to help me some with my memory"    Currently in Pain? No/denies                   ADULT SLP TREATMENT - 11/30/21 0001       General Information   Behavior/Cognition Alert;Pleasant mood;Cooperative    HPI Referred by neurologist for memory impairments      Treatment Provided   Treatment provided Cognitive-Linquistic      Pain Assessment   Pain Assessment No/denies pain      Cognitive-Linquistic Treatment   Treatment focused on Cognition;Patient/family/caregiver education    Skilled Treatment Skilled intervention targeted education and training of memory aids including  calendar, voicemail, and note taking. Patient required moderate assistance for selection of key words for note taking to increase functionality of notes. Patient demonstrated auditory recall of key details from a 2 sentence story with 75% accuracy x16. Improved to 100% accuracy x6 s/p note taking training. Patient endorsed benefit of utilizing key word extraction for notes as patient at home often tries to write entire sentences resultingin missed information as communication partners over the phone continue speaking.      Assessment / Recommendations / Plan   Plan Continue with current plan of care              SLP Education - 11/30/21 1553     Education Details memory aids    Person(s) Educated Patient    Methods Explanation;Demonstration    Comprehension Verbalized understanding;Returned demonstration              SLP Short Term Goals - 11/24/21 1925       SLP SHORT TERM GOAL #1   Title Patient will establish external aid for memory/executive function and bring to more than 75% of therapy sessions.    Time 4    Period Weeks    Status New  SLP SHORT TERM GOAL #2   Title Pt will demonstrate awareness of attention/processing breakdowns in conversation 3/5 opportunities.    Time 4    Period Weeks    Status New              SLP Long Term Goals - 11/24/21 1928       SLP LONG TERM GOAL #1   Title Patient will use system for executive function and recall to manage appointments, daily tasks, and medical information >80% accuracy with min cues.    Time 12    Period Weeks    Status New    Target Date 02/22/22      SLP LONG TERM GOAL #2   Baseline Patient will request rephrasing/repetition when attention/processing breakdowns in conversation breakdowns occur >80% of the time    Time 12    Period Weeks    Status New    Target Date 02/22/22      SLP LONG TERM GOAL #3   Title Pt will verbalize and demonstrate how to implement 2 memory and attention strategies to  aid daily functioning given occasional min A over 2 sessions    Time 12    Period Weeks    Status New    Target Date 02/22/22      SLP LONG TERM GOAL #4   Title Patient will complete daily cognitive linguistic exercises 5/7 days.    Time 12    Period Weeks    Status New    Target Date 02/22/22              Plan - 11/30/21 1553     Clinical Impression Statement Patient presents with overall mild-moderate cognitive communication deficits per standardized cognitive testing via CLQT. Skilled intervention targeted training memory aids including calendar use, to do lists, task breakdown, and note taking. Patient required moderate assistance for note taking though demosntrated improvement with continued practice of tasks. I continue to recommend brief course of ST to train pt in compensations to improve participation and satisfaction with communication with friends, family and in the community, as well as to train pt/spouse in principles of neuroplasticity and benefits of a cognitive home program.    Speech Therapy Frequency 1x /week    Duration 12 weeks    Treatment/Interventions Language facilitation;Cueing hierarchy;SLP instruction and feedback;Compensatory techniques;Cognitive reorganization;Functional tasks;Compensatory strategies;Internal/external aids;Multimodal communcation approach;Patient/family education    Potential to [name] see pt instructions    Consulted and Agree with Plan of Care Patient             Patient will benefit from skilled therapeutic intervention in order to improve the following deficits and impairments:   Cognitive communication deficit    Problem List Patient Active Problem List   Diagnosis Date Noted   Memory change 11/27/2021   Fatigue 07/23/2021   Aortic atherosclerosis ([place]) 04/17/2021   Right acute serous otitis media    Hyponatremia 02/23/2021   Prolonged QT interval 02/23/2021   Dizziness 09/12/2020    Fall 03/08/2020   Fatty liver 12/28/2019   Chest pain 07/25/2019   Frequent urinary tract infections 12/24/2018   UTI (urinary tract infection) 10/11/2018   Hypercalcemia 01/09/2017   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 Charlie Char, M.A. [place] [phone]  [place], [place] 11/30/2021, 3:57 PM  [place] MAIN [facility] SERVICES [address] [place], [place], [address] Phone: [phone]   Fax:  [phone]   Name: Crystal Lynch MRN: [id number] Date of Birth: 28-Mar-1941

## 2021-11-30 NOTE — Telephone Encounter (Signed)
lisinopril (ZESTRIL) 20 MG tablet 90 tablet 3 11/30/2021    Sig - Route: Take 1 tablet (20 mg total) by mouth daily. - Oral   Sent to pharmacy as: lisinopril (ZESTRIL) 20 MG tablet   E-Prescribing Status: Sent to pharmacy (11/30/2021 11:52 AM EST)    Pharmacy  [id number] [address], [place]

## 2021-11-30 NOTE — Patient Instructions (Signed)
HEP: Write down notes from church and bring next session. Consider pocket planner/calendar for increased usage of calendar outside the home

## 2021-12-02 ENCOUNTER — Telehealth: Payer: Self-pay

## 2021-12-02 NOTE — Telephone Encounter (Signed)
Submitted PA over the phone for her protonix with [facility] to get quanity exception. Submitted as urgent so should have results within 24 hours. LM to let patient know.

## 2021-12-02 NOTE — Telephone Encounter (Signed)
Patient aware of below.

## 2021-12-02 NOTE — Telephone Encounter (Signed)
Pt returning call. Pt requesting callback.  °

## 2021-12-06 ENCOUNTER — Telehealth: Payer: Self-pay | Admitting: Internal Medicine

## 2021-12-06 NOTE — Telephone Encounter (Signed)
I know you have been consulting with Dr Lanora Manis. Are you ok with this?

## 2021-12-06 NOTE — Telephone Encounter (Signed)
Pt called in stating that the kidney doctor next appointment want be until 2/22. Pt want to know if the provider want her to hold off until then or for her to see another doctor.

## 2021-12-06 NOTE — Telephone Encounter (Signed)
This is good.  No urgency.

## 2021-12-06 NOTE — Telephone Encounter (Signed)
Per chart, patient set up with therapy

## 2021-12-07 ENCOUNTER — Ambulatory Visit: Payer: Medicare Other | Admitting: Speech Pathology

## 2021-12-07 ENCOUNTER — Other Ambulatory Visit: Payer: Self-pay

## 2021-12-07 DIAGNOSIS — R41841 Cognitive communication deficit: Secondary | ICD-10-CM

## 2021-12-07 NOTE — Patient Instructions (Addendum)
Spend 30 minutes a day doing some sort of cognitive activity:  Checkers Writer 4 [employer] games Jig saw puzzles Easy cross words Memory match Board games [employer]ong Learn a new game!  Listen to and discuss Ted Talks or Podcasts Read and discuss short articles of interest to you- Take notes on these if memory is a challenge Discuss social media posts Look and discuss photo albums  The best activities to improve cognition are functional, real life activities that are important to you:  Plan a menu Participate in household chores and decision Read a book (or listen to an [place]) you enjoy and take notes Take notes in church or during your circle Participate in [place] a party, trip or tailgate with all of the details (even if you aren't really going to carry it out) Participate in your hobby as you are able with assistance Manage your texts, emails with supervision if needed. Google search for items (even if you're not really going to buy anything) and compare prices and features Socialize -  however, too many visitors can be overwhelming, so set limits "My doctor said I should only visit (or talk) for 20 minutes" or "I do better when I visit with just 1-2 people at a time for 20 minutes"      Strategies for Improving Your Attention and Memory  Use good eye-contact Give the speaker your undivided attention Look directly at the speaker  Complete one task at a time Avoid multitasking Complete one task before starting a new one Write a note to yourself if you think of something else that needs to be done Let others know when you need quiet time and can't be interrupted Don't answer the phone, texts, or emails while you are working on another task  Put aside distracting thoughts If you find your mind wandering, refocus your attention on the speaker Avoid off-topic comments or responses that may divert your attention  If something important  comes to mind, let the speaker know and pause to write yourself a note: "Do you mind holding on a minute, I have to write something down." Put thoughts on hold and focus on salient information  Limit distractions in your environment Think about the environment around you Limit background noise by turning off the TV or music, putting your phone away Close the door and work in quiet  Use active listening Actively participate in the conversation to stay focused Paraphrase what you have heard to include the most important details Adding some associations may help you remember Ask questions to clarify certain points Summarize the speaker's comments periodically Avoid nodding your head and using "mhm" responses as these are more passive and don't help your attention  Alert the other person/people It may be helpful to alert your listener to the fact that you may need reminders to keep on track Tell the speaker in advance that you may need to stop them and have them repeat salient information If you lose focus, interject and let the person know, "I'm sorry, I lost you, can you tell me again?"  Write down information Write down pertinent information as it comes up, such as telephone numbers, names of people, addresses, details from appointments and conversations, etc.   Memory Compensation Strategies  Use WARM strategy. W= write it down A=  associate it R=  repeat it M=  make a mental picture  You can keep a Memory Notebook. Use a 3-ring notebook with sections for the  following:  calendar, important names and phone numbers, medications, doctors names/phone numbers, to do list/reminders, and a section to journal what you did each day  Use a calendar to write appointments down.  Write yourself a schedule for the day.  This can be placed on the calendar or in a separate section of the Memory Notebook.  Keeping a regular schedule can help memory.  Use medication organizer with  sections for each day or morning/evening pills  You may need help loading it  Keep a basket, or pegboard by the door.   Place items that you need to take out with you in the basket or on the pegboard.  You may also want to include a message board for reminders.  Use sticky notes. Place sticky notes with reminders in a place where the task is performed.  For example:  turn off the stove placed by the stove, lock the door placed on the door at eye level, take your medications on the bathroom mirror or by the place where you normally take your medications  Use alarms, timers, and/or a reminder app. Use while cooking to remind yourself to check on food or as a reminder to take your medicine, or as a reminder to make a call, or as a reminder to perform another task, etc.  Use a voice recorder app or small tape recorder to record important information and notes for yourself. Go back at the end of the day and listen to these.

## 2021-12-07 NOTE — Therapy (Signed)
[place] MAIN [facility] SERVICES [address] [place], [place], [address] Phone: [phone]   Fax:  [phone]  Speech Language Pathology Treatment and Discharge Summary  Patient Details  Name: Crystal Lynch MRN: [id number] Date of Birth: [birth year] Referring Provider (SLP): Dr. Manuella Ghazi   Encounter Date: 12/07/2021   End of Session - 12/07/21 1235     Visit Number 3    Number of Visits 12    Date for SLP Re-Evaluation 02/22/22    Authorization Type Medicare    Authorization - Visit Number 3    Progress Note Due on Visit 10    SLP Start Time 1100    SLP Stop Time  1155    SLP Time Calculation (min) 55 min    Activity Tolerance Patient tolerated treatment well             Past Medical History:  Diagnosis Date   Arthritis    Chicken pox    Endometriosis    Esophagitis    s/p esophageal dilatation   GERD (gastroesophageal reflux disease)    History of ovarian cyst    Hypercholesterolemia    Hypertension    Hypothyroidism     Past Surgical History:  Procedure Laterality Date   ABDOMINAL SURGERY     found to have an ovarian cyst and endometriosis   APPENDECTOMY      There were no vitals filed for this visit.   Subjective Assessment - 12/07/21 1224     Subjective "I think today should be my last visit," (for ST)    Currently in Pain? No/denies                   ADULT SLP TREATMENT - 12/07/21 1224       General Information   Behavior/Cognition Alert;Pleasant mood;Cooperative    HPI Referred by neurologist for memory impairments      Treatment Provided   Treatment provided Cognitive-Linquistic      Cognitive-Linquistic Treatment   Treatment focused on Cognition;Patient/family/caregiver education    Skilled Treatment Pt reports she would like to conclude ST to focus on her balance; has PT eval on Friday. Focused session on training/education in compensations for attention and memory, as well as identifying  functional cognitive activities pt can do at home. Pt reports getting distracted easily when doing chores. Pt took notes (with occasional min cues) of SLP suggestions for attention strategies, including single-tasking, using to do lists/checklists, reducing distractions, taking notes during church/circle, when reading and when watching tv.  Reviewed memory compensations and provided examples of each WARM strategy. Patient was noted to have difficulties spelling while taking notes; with mod cues used strategy of verbalization to correct errors. Encouraged pt to follow up with neuropsychology for further assessment of cognitive linguistic impairments.      Assessment / Recommendations / Plan   Plan Discharge SLP treatment due to (comment)   pt d/c at her request     Progression Toward Goals   Progression toward goals --   goals partially met             SLP Education - 12/07/21 1235     Education Details memory and attention strategies, functional cognitive activities for home    Person(s) Educated Patient    Methods Explanation;Handout    Comprehension Verbalized understanding;Need further instruction              SLP Short Term Goals - 12/07/21 1245  SLP SHORT TERM GOAL #1   Title Patient will establish external aid for memory/executive function and bring to more than 75% of therapy sessions.    Time 4    Period Weeks    Status Not Met      SLP SHORT TERM GOAL #2   Title Pt will demonstrate awareness of attention/processing breakdowns in conversation 3/5 opportunities.    Time 4    Period Weeks    Status Not Met              SLP Long Term Goals - 12/07/21 1245       SLP LONG TERM GOAL #1   Title Patient will use system for executive function and recall to manage appointments, daily tasks, and medical information >80% accuracy with min cues.    Time 12    Period Weeks    Status Not Met      SLP LONG TERM GOAL #2   Baseline Patient will request  rephrasing/repetition when attention/processing breakdowns in conversation breakdowns occur >80% of the time    Time 12    Period Weeks    Status Not Met      SLP LONG TERM GOAL #3   Title Pt will verbalize and demonstrate how to implement 2 memory and attention strategies to aid daily functioning given occasional min A over 2 sessions    Time 12    Period Weeks    Status Achieved      SLP LONG TERM GOAL #4   Title Patient will complete daily cognitive linguistic exercises 5/7 days.    Time 12    Period Weeks    Status New              Plan - 12/07/21 1236     Clinical Impression Statement Patient presents with overall mild-moderate cognitive communication deficits for age per standardized cognitive testing via CLQT. Patient is discharged today upon her request; she would like to focus on doing physical therapy for her balance. Skilled ST session focused on training in compensatory strategies for attention and memory for pt to use to improve function at home, as well as education on functional cognitive activities. Patient reporting functional deficits in attention (getting distracted easily, unfinished tasks/chores, difficulty paying attention to television programs, reading and conversation. She also exhibited  (and verbalized changes) difficulty with spelling when taking notes during the session. Discussed with pt that neuropsychological evaluation may be appropriate; encouraged her to discuss this further with Dr. Manuella Ghazi.    Speech Therapy Frequency --   d/c   Duration --   d/c   Treatment/Interventions Language facilitation;Cueing hierarchy;SLP instruction and feedback;Compensatory techniques;Cognitive reorganization;Functional tasks;Compensatory strategies;Internal/external aids;Multimodal communcation approach;Patient/family education    Potential to [place]    SLP Home Exercise Plan see pt instructions    Consulted and Agree with Plan of Care Patient              Patient will benefit from skilled therapeutic intervention in order to improve the following deficits and impairments:   Cognitive communication deficit    Problem List Patient Active Problem List   Diagnosis Date Noted   Memory change 11/27/2021   Fatigue 07/23/2021   Aortic atherosclerosis ([place]) 04/17/2021   Right acute serous otitis media    Hyponatremia 02/23/2021   Prolonged QT interval 02/23/2021   Dizziness 09/12/2020   Fall 03/08/2020   Fatty liver 12/28/2019   Chest pain 07/25/2019   Frequent urinary tract infections 12/24/2018  UTI (urinary tract infection) 10/11/2018   Hypercalcemia 01/09/2017   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    Remaining deficits: Mild-moderate cognitive communication impairments with deficits in attention, memory, and written expression noted today.   Education / Equipment: Attention and memory strategies, cognitive activities for home   Patient agrees to discharge. Patient goals were partially met. Patient is being discharged due to the patient's request..    Deneise Lever, [place], [place] Speech-Language Pathologist  Aliene Altes, [place] 12/07/2021, 12:46 PM  [place] [address] [place], [place], [address] Phone: [phone]   Fax:  [phone]   Name: Crystal Lynch MRN: [id number] Date of Birth: 1941-08-23

## 2021-12-07 NOTE — Telephone Encounter (Signed)
Patient aware of below.

## 2021-12-09 ENCOUNTER — Other Ambulatory Visit: Payer: Self-pay

## 2021-12-09 ENCOUNTER — Ambulatory Visit: Payer: Medicare Other

## 2021-12-09 DIAGNOSIS — R41841 Cognitive communication deficit: Secondary | ICD-10-CM | POA: Diagnosis not present

## 2021-12-09 DIAGNOSIS — R2689 Other abnormalities of gait and mobility: Secondary | ICD-10-CM

## 2021-12-09 DIAGNOSIS — R42 Dizziness and giddiness: Secondary | ICD-10-CM

## 2021-12-09 DIAGNOSIS — M6281 Muscle weakness (generalized): Secondary | ICD-10-CM

## 2021-12-09 DIAGNOSIS — R2681 Unsteadiness on feet: Secondary | ICD-10-CM

## 2021-12-09 NOTE — Therapy (Signed)
[place] MAIN [facility] SERVICES [place], [place], [address] Phone: [phone]   Fax:  [phone]  Physical Therapy Evaluation  Patient Details  Name: Crystal Lynch MRN: [id number] Date of Birth: December 03, 1940 Referring Provider (PT): Vladimir Crofts, MD   Encounter Date: 12/09/2021   PT End of Session - 12/10/21 0948     Visit Number 1    Number of Visits 25    Date for PT Re-Evaluation 03/03/22    PT Start Time 0944    PT Stop Time 1032    PT Time Calculation (min) 48 min    Equipment Utilized During Treatment Gait belt    Activity Tolerance Patient tolerated treatment well    Behavior During Therapy WFL for tasks assessed/performed             Past Medical History:  Diagnosis Date   Arthritis    Chicken pox    Endometriosis    Esophagitis    s/p esophageal dilatation   GERD (gastroesophageal reflux disease)    History of ovarian cyst    Hypercholesterolemia    Hypertension    Hypothyroidism     Past Surgical History:  Procedure Laterality Date   ABDOMINAL SURGERY     found to have an ovarian cyst and endometriosis   APPENDECTOMY      There were no vitals filed for this visit.    Subjective Assessment - 12/09/21 0945     Subjective Pt presents to PT evaluation ambulating with QC. Pt with c/o imbalance and dizziness that started >4 months ago. The pt is known to this clinic and was previously seen for vestibular therapy. Pt reports vestibular therapy did improve her symptoms some. Pt also recently seen by Speech due to memory changes. Pt seen by ENT and followed by neurologist (Dr. Manuella Ghazi). She reports recent imaging/work-up but that a cause for dizziness and imbalance has not been determined. Pt current concerns and symptoms include: difficulty with turns, bothered by bright lights, FOF, problems with her vision (has eye appointment next week d/t cataracts, per pt).    Pertinent History Pt presents to PT  evaluation ambulating with QC. Pt with c/o imbalance and dizziness that started >4 months ago. The pt is known to this clinic and was previously seen for vestibular therapy. Pt reports vestibular therapy did improve her symptoms some. Pt also recently seen by Speech due to memory changes. Pt seen by ENT and followed by neurologist (Dr. Manuella Ghazi). She reports recent imaging/work-up but that a cause for dizziness and imbalance has not been determined. Pt current concerns and symptoms include: difficulty with turns, bothered by bright lights, FOF, problems with her vision (has eye appointment next week d/t cataracts, per pt). 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    Limitations Standing;House hold activities;Walking;Lifting;Reading   reading limited due to difficulty focusing, thinks due to vision   How long can you sit comfortably? not affected    How long can you stand comfortably? Pt reports she will lean against something due to unsteadiness when standing    How long can you walk comfortably? Pt ambulates with QC or holds onto her husband's ([name]) arm due to unsteadiness    Diagnostic tests Per chart of MRI BRAIN 1/17 "Parenchymal volume loss  and mild to moderate chronic microvascular ischemic changes"    Patient Stated Goals Pt states, "I would like to be able to jump up out of my chair like I've always done without having to be cautious all the time, without feeling like I'm going to fall." Pt states, "I want my life back."    Currently in Pain? Yes    Pain Score 4     Pain Location Head   headache   Pain Orientation Right;Left    Pain Descriptors / Indicators Dull    Pain Type Acute pain   pt reports this happens  occassionally in the last few weeks. Pt reports death in family. pt thinks this is due to stress   Pain Onset More than a month ago   pt reports HA is on and off           SUBJECTIVE Chief complaint: Imbalance, dizziness (see subjective for details) History: Pt reports onset of a little over 4 months ago. Per pt imaging/work-up has not indicated a reason for her symptoms. Referring Dx: imbalance Referring Provider: Dr. Manuella Ghazi Prior history of physical therapy for balance: Prior vestibular therapy. Pt reports this did help her symptoms some. Follow-up appointment with MD: Yes Dominant hand: right Falls in the last 6 months: No  Hobbies: Spending time with friends, going to church, going out to eat.    OBJECTIVE  Musculoskeletal Tremor: Absent Bulk: Normal  Posture Rounded shoulders, slight kyphosis   Gait Use of QC, pt at times picks up cane and holds it as she walks. Variability in BOS noted, particularly with turning. Pt tends to use a wider BOS for majority of ambulation observed. Pt with observed decreased gait speed.    Strength BLE grossly 4-/5  NEUROLOGICAL:  Sensation Grossly intact to light touch bilateral UEs/LEs as determined by testing dermatomes C2-T2/L2-S2 respectively. Pt reports L side feels a bit "stronger," otherwise pt able to feel all light touch in BUEs/BLES.    Coordination/Cerebellar Finger to chin: WNL Heel to Shin: WNL Rapid alternating movements: WNL    FUNCTIONAL OUTCOME MEASURES   Results Comments  DVA 3 line difference Static: line 7. Dynamic: line 4. Indicates impairment of gaze stability.  DGI 13/24 Score indicates fall risk  FOTO                10 Meter Gait Speed deferred   ABC Scale Deferred          POSTURAL CONTROL TESTS   Modified Clinical Test of Sensory Interaction for Balance    (CTSIB):  CONDITION TIME STRATEGY SWAY  Eyes open, firm surface 30 seconds ankle Increased sway  Eyes closed, firm surface 30  seconds ankle Increased sway  Eyes open, foam surface 30 seconds UE support, hip, ankle Unable to complete, LOB  Eyes closed, foam surface 30 seconds Ankle, UE support, hip Unable to complete, LOB   INTERVENTIONS:  Instructed pt to resume prior HEP (seated VORx1) and reviewed symptoms/activity tracker handout with pt.   ASSESSMENT Clinical Impression: The pt is a pleasant 81 year-old female referred for difficulty with balance who also reports FOF. PT examination reveals deficits in balance (particularly with turning), gaze stability, gait, and LE strength. Performance on M-CTSIB suggests pt with decreased use of proprioceptive and vestibular cues to maintain balance. Performance on DGI indicates pt is at an increased risk for falls. Exam limited due to pt late arrival. Further assessment will need to be completed next session. The pt will benefit from  skilled PT services to address deficits in strength, gaze stability, gait and balance in order to increase QOL and decrease fall risk.  PLAN Next Visit: Complete further assessment, initiate  balance, strength, VOR.  HEP:  Instructed pt to restart seated VOR HEP previously provided during vestibular therapy. PT also provided pt with activity and symptom tracker printout. Pt verbalizes understanding.      [facility] PT Assessment - 12/09/21 0955       Assessment   Medical Diagnosis imbalance    Referring Provider (PT) Vladimir Crofts, MD    Onset Date/Surgical Date --   >4 mo ago   Next MD Visit Pt reports follow-up with Manuella Ghazi this month    Prior Therapy Yes      Precautions   Precautions Fall      Restrictions   Weight Bearing Restrictions No      Balance Screen   Has the patient fallen in the past 6 months No    Has the patient had a decrease in activity level because of a fear of falling?  Yes    Is the patient reluctant to leave their home because of a fear of falling?  No      Home Ecologist residence     Living Arrangements Spouse/significant other    Available Help at Discharge Family    Type of [place] to enter    Entrance Stairs-Number of Steps 1    Entrance Stairs-Rails Left    Home Layout One level    [place] - quad;Grab bars - tub/shower      Prior Function   Level of Independence Independent      Standardized Balance Assessment   Standardized Balance Assessment Dynamic Gait Index      Dynamic Gait Index   Level Surface Mild Impairment    Change in Gait Speed Mild Impairment    Gait with Horizontal Head Turns Moderate Impairment    Gait with Vertical Head Turns Mild Impairment    Gait and Pivot Turn Mild Impairment    Step Over Obstacle Moderate Impairment    Step Around Obstacles Mild Impairment    Steps Moderate Impairment    Total Score 13                        Objective measurements completed on examination: See above findings.                PT Education - 12/10/21 0947     Education Details exam findings, indications for PT/POC, HEP and symptom tracker handout    Person(s) Educated Patient    Methods Explanation;Handout    Comprehension Verbalized understanding;Need further instruction              PT Short Term Goals - 12/09/21 1003       PT SHORT TERM GOAL #1   Title Pt will be independent with HEP in order to improve strength and balance in order to decrease fall risk and improve function at home and work.    Baseline 2/17: instructed pt to resume her previous seated VORx1 HEP, provided activity/symptom tracker handout    Time 6    Period Weeks    Status New    Target Date 01/20/22               PT Long Term Goals - 12/09/21 1005  PT LONG TERM GOAL #1   Title Patient will have improved FOTO score of 5 points or greater in order to demonstrate improvements in patient's ADLs and functional performance.    Baseline 2/17: 60%    Time 12    Period Weeks    Status New     Target Date 03/03/22      PT LONG TERM GOAL #2   Title Pt will improve ABC by at least 13% in order to demonstrate clinically significant improvement in balance confidence.    Baseline 2/17: to be assessed next 1-2 session (was 81% on 06/14/2021)    Time 12    Period Weeks    Status New    Target Date 03/03/22      PT LONG TERM GOAL #3   Title Patient will demonstrate reduced falls risk as evidenced by Dynamic Gait Index (DGI) 21/24 or greater.    Baseline 2/17: 13/24 (was 23/24 on 06/14/2021)    Time 12    Period Weeks    Status New    Target Date 03/03/22      PT LONG TERM GOAL #4   Title Patient will have improved gross B lower extremity to at least 4+/5 in order to improve patient's balance and mobility skills.    Baseline 2/17: grossly 4-/5    Time 12    Period Weeks    Status New    Target Date 03/03/22      PT LONG TERM GOAL #5   Title Patient will have improved 16mwt gait speed to >1 m/s for improved community ambulation and gait ability.    Baseline 2/17: to be assessed next 1-2 sessions    Time 12    Period Weeks    Status New    Target Date 03/03/22                    Plan - 12/10/21 0954     Clinical Impression Statement The pt is a pleasant 81 year-old female referred for difficulty with balance who also reports FOF. PT examination reveals deficits in balance (particularly with turning), gaze stability, gait, and LE strength. Performance on M-CTSIB suggests pt with decreased use of proprioceptive and vestibular cues to maintain balance. Performance on DGI indicates pt is at an increased risk for falls. Exam limited due to pt late arrival. Further assessment will need to be completed next session. The pt will benefit from skilled PT services to address deficits in strength, gaze stability, gait and balance in order to increase QOL and decrease fall risk.    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    Examination-Activity Limitations Locomotion Level;Carry;Lift;Hygiene/Grooming;Bend;Reach Overhead;Stairs;Stand;Transfers    Examination-Participation Restrictions Community Activity;Yard Work;Cleaning;Shop    Stability/Clinical Decision Making Evolving/Moderate complexity    Clinical Decision Making Moderate    Rehab Potential Good    PT Frequency 2x / week    PT Duration 12 weeks    PT Treatment/Interventions ADLs/Self Care Home Management;Aquatic Therapy;Biofeedback;Canalith Repostioning;Cryotherapy;Electrical Stimulation;Moist Heat;Traction;Ultrasound;DME Instruction;Gait training;Stair training;Functional mobility training;Therapeutic activities;Therapeutic exercise;Balance training;Neuromuscular re-education;Patient/family education;Orthotic Fit/Training;Wheelchair mobility training;Manual techniques;Passive range of motion;Energy conservation;Taping;Vestibular;Visual/perceptual remediation/compensation;Splinting;Spinal Manipulations;Joint Manipulations;Dry needling    PT Next Visit Plan Complete further assessment, including 10MWT, balance strength, review  VOR    PT Home Exercise Plan Instructed pt to resume previous seated VORx1 HEP (provided when pt in vestibular therapy), provided pt with symptom/activity tracker handout. Will advance HEP    Consulted and Agree with Plan of Care Patient             Patient will benefit from skilled therapeutic intervention in order to improve the following deficits and impairments:  Abnormal gait, Decreased activity tolerance, Decreased strength, Dizziness, Improper body mechanics, Pain, Decreased balance, Decreased mobility, Difficulty  walking, Impaired vision/preception, Postural dysfunction  Visit Diagnosis: Unsteadiness on feet  Dizziness and giddiness  Muscle weakness (generalized)  Other abnormalities of gait and mobility     Problem List Patient Active Problem List   Diagnosis Date Noted   Memory change 11/27/2021   Fatigue 07/23/2021   Aortic atherosclerosis ([place]) 04/17/2021   Right acute serous otitis media    Hyponatremia 02/23/2021   Prolonged QT interval 02/23/2021   Dizziness 09/12/2020   Fall 03/08/2020   Fatty liver 12/28/2019   Chest pain 07/25/2019   Frequent urinary tract infections 12/24/2018   UTI (urinary tract infection) 10/11/2018   Hypercalcemia 01/09/2017   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

## 2021-12-13 ENCOUNTER — Encounter: Payer: Self-pay | Admitting: Internal Medicine

## 2021-12-13 DIAGNOSIS — M25552 Pain in left hip: Secondary | ICD-10-CM | POA: Insufficient documentation

## 2021-12-14 ENCOUNTER — Encounter: Payer: Self-pay | Admitting: Internal Medicine

## 2021-12-14 ENCOUNTER — Encounter: Payer: Medicare Other | Admitting: Speech Pathology

## 2021-12-14 DIAGNOSIS — N281 Cyst of kidney, acquired: Secondary | ICD-10-CM | POA: Insufficient documentation

## 2021-12-14 DIAGNOSIS — N390 Urinary tract infection, site not specified: Secondary | ICD-10-CM | POA: Insufficient documentation

## 2021-12-14 DIAGNOSIS — N289 Disorder of kidney and ureter, unspecified: Secondary | ICD-10-CM | POA: Insufficient documentation

## 2021-12-20 ENCOUNTER — Ambulatory Visit: Payer: Medicare Other

## 2021-12-20 ENCOUNTER — Other Ambulatory Visit: Payer: Self-pay

## 2021-12-20 DIAGNOSIS — R2689 Other abnormalities of gait and mobility: Secondary | ICD-10-CM

## 2021-12-20 DIAGNOSIS — R41841 Cognitive communication deficit: Secondary | ICD-10-CM | POA: Diagnosis not present

## 2021-12-20 DIAGNOSIS — M6281 Muscle weakness (generalized): Secondary | ICD-10-CM

## 2021-12-20 DIAGNOSIS — R2681 Unsteadiness on feet: Secondary | ICD-10-CM

## 2021-12-20 DIAGNOSIS — R42 Dizziness and giddiness: Secondary | ICD-10-CM

## 2021-12-20 DIAGNOSIS — R278 Other lack of coordination: Secondary | ICD-10-CM

## 2021-12-20 NOTE — Therapy (Signed)
[place] MAIN [facility] SERVICES [address] [place], [place], [address] Phone: [phone]   Fax:  [phone]  Physical Therapy Treatment  Patient Details  Name: Crystal Lynch MRN: [id number] Date of Birth: 09-Mar-1941 Referring Provider (PT): Vladimir Crofts, MD   Encounter Date: 12/20/2021   PT End of Session - 12/20/21 1109     Visit Number 2    Number of Visits 25    Date for PT Re-Evaluation 03/03/22    PT Start Time 1106    PT Stop Time 1145    PT Time Calculation (min) 39 min    Equipment Utilized During Treatment Gait belt    Activity Tolerance Patient tolerated treatment well    Behavior During Therapy WFL for tasks assessed/performed             Past Medical History:  Diagnosis Date   Arthritis    Chicken pox    Endometriosis    Esophagitis    s/p esophageal dilatation   GERD (gastroesophageal reflux disease)    History of ovarian cyst    Hypercholesterolemia    Hypertension    Hypothyroidism     Past Surgical History:  Procedure Laterality Date   ABDOMINAL SURGERY     found to have an ovarian cyst and endometriosis   APPENDECTOMY      There were no vitals filed for this visit.   Subjective Assessment - 12/20/21 1107     Subjective Pt reports 4/10 dizziness today. She reports she has restared VOR HEP and that she thinks it helps.    Pertinent History Pt presents to PT evaluation ambulating with QC. Pt with c/o imbalance and dizziness that started >4 months ago. The pt is known to this clinic and was previously seen for vestibular therapy. Pt reports vestibular therapy did improve her symptoms some. Pt also recently seen by Speech due to memory changes. Pt seen by ENT and followed by neurologist (Dr. Manuella Ghazi). She reports recent imaging/work-up but that a cause for dizziness and imbalance has not been determined. Pt current concerns and symptoms include: difficulty with turns, bothered by bright lights, FOF,  problems with her vision (has eye appointment next week d/t cataracts, per pt). 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    Limitations Standing;House hold activities;Walking;Lifting;Reading   reading limited due to difficulty focusing, thinks due to vision   How long can you sit comfortably? not affected    How long can you stand comfortably? Pt reports she will lean against something due to unsteadiness when standing    How long can you walk comfortably? Pt ambulates with QC or holds onto her husband's ([name]) arm due to unsteadiness    Diagnostic tests Per chart of MRI BRAIN 1/17 "Parenchymal volume loss and mild to moderate chronic microvascular ischemic changes"    Patient Stated Goals Pt states, "I would like to be able to jump up out of my chair like I've always done without having to be cautious all the time, without feeling like I'm going to fall." Pt states, "I want my life back."    Currently in Pain? Yes    Pain Location Hip   hip bursitis pre pt, recently had shot for it   Pain Orientation Right    Pain  Onset More than a month ago   pt reports HA is on and off             INTERVENTIONS  ABC SCALE: 40.3% 10MWT: 0.83 m/s  STS 3x10; rates challenging, cuing for technique; with and without UE assist Standing hip abduction 3x10 each LE; pt rates medium   VORx1 horizontal and vertical head turns, plain background 4x30 sec of each Comments: pt reports seeing multiple lines. Reports horizontal head turns as more difficult. Pt does report seeing double lines with vertical head turns, however. Wooziness reaches 4/10. Pt requires recovery intervals between reps. Pt thinks bifocals caused double lines with  vertical head turns.  PT reviews HEP with pt:  Access Code: [email or web address] URL: [email or web address] Date: 12/20/2021 Prepared by: Ricard Dillon  Exercises Sit to Stand with Counter Support - 1 x daily - 5 x weekly - 2 sets - 10 reps Seated Gaze Stabilization with Head Rotation - 1 x daily - 7 x weekly - 3 sets - 3 reps - 60 hold  Assessment: PT issued HEP to pt on this date. Pt highly motivated to participate in session. Pt rated majority of strengthening and endurance interventions as moderate to challenging and exhibited greatest weakness in LLE. Regarding gaze stability, pt most challenged with horizontal head turns. The pt will benefit from further skilled PT to improve these deficits to reduce fall risk and increase QOL.  Pt educated throughout session about proper posture and technique with exercises. Improved exercise technique, movement at target joints, use of target muscles after min to mod verbal, visual, tactile cues.                 PT Education - 12/20/21 1108     Education Details testing, exercise technique, review of HEP    Person(s) Educated Patient    Methods Explanation;Demonstration;Verbal cues    Comprehension Verbalized understanding;Returned demonstration;Need further instruction;Verbal cues required              PT Short Term Goals - 12/09/21 1003       PT SHORT TERM GOAL #1   Title Pt will be independent with HEP in order to improve strength and balance in order to decrease fall risk and improve function at home and work.    Baseline 2/17: instructed pt to resume her previous seated VORx1 HEP, provided activity/symptom tracker handout    Time 6    Period Weeks    Status New    Target Date 01/20/22               PT Long Term Goals - 12/20/21 1109       PT LONG TERM GOAL #1   Title Patient will have improved FOTO score of 5 points or greater in order to demonstrate improvements in patient's ADLs and functional  performance.    Baseline 2/17: 60%    Time 12    Period Weeks    Status New    Target Date 03/03/22      PT LONG TERM GOAL #2   Title Pt will improve ABC by at least 13% in order to demonstrate clinically significant improvement in balance confidence.    Baseline 2/17: to be assessed next 1-2 session (was 81% on 06/14/2021); 2/28: 40.3%    Time 12    Period Weeks    Status New    Target Date 03/03/22      PT LONG TERM GOAL #3   Title Patient will demonstrate reduced falls  risk as evidenced by Dynamic Gait Index (DGI) 21/24 or greater.    Baseline 2/17: 13/24 (was 23/24 on 06/14/2021)    Time 12    Period Weeks    Status New    Target Date 03/03/22      PT LONG TERM GOAL #4   Title Patient will have improved gross B lower extremity to at least 4+/5 in order to improve patient's balance and mobility skills.    Baseline 2/17: grossly 4-/5    Time 12    Period Weeks    Status New    Target Date 03/03/22      PT LONG TERM GOAL #5   Title Patient will have improved 31mwt gait speed to >1 m/s for improved community ambulation and gait ability.    Baseline 2/17: to be assessed next 1-2 sessions; 2/28: 0.83 m/s with QC    Time 12    Period Weeks    Status New    Target Date 03/03/22                   Plan - 12/20/21 1150     Clinical Impression Statement PT issued HEP to pt on this date. Pt highly motivated to participate in session. Pt rated majority of strengthening and endurance interventions as moderate to challenging and exhibited greatest weakness in LLE. Regarding gaze stability, pt most challenged with horizontal head turns. The pt will benefit from further skilled PT to improve these deficits to reduce fall risk and increase QOL.    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    Examination-Activity Limitations Locomotion Level;Carry;Lift;Hygiene/Grooming;Bend;Reach Overhead;Stairs;Stand;Transfers    Examination-Participation Restrictions Community Activity;Yard Work;Cleaning;Shop    Stability/Clinical Decision Making Evolving/Moderate complexity    Rehab Potential Good    PT Frequency 2x / week    PT Duration 12 weeks    PT Treatment/Interventions ADLs/Self Care Home Management;Aquatic Therapy;Biofeedback;Canalith Repostioning;Cryotherapy;Electrical Stimulation;Moist Heat;Traction;Ultrasound;DME Instruction;Gait training;Stair training;Functional mobility training;Therapeutic activities;Therapeutic exercise;Balance training;Neuromuscular re-education;Patient/family education;Orthotic Fit/Training;Wheelchair mobility training;Manual techniques;Passive range of motion;Energy conservation;Taping;Vestibular;Visual/perceptual remediation/compensation;Splinting;Spinal Manipulations;Joint Manipulations;Dry needling    PT Next Visit Plan Complete further assessment, including 10MWT, balance strength, review VOR    PT Home Exercise Plan Instructed pt to resume previous seated VORx1 HEP (provided when pt in vestibular therapy), provided pt with symptom/activity tracker handout. Will advance HEP;Access Code: [email or web address]    Consulted and Agree with Plan of Care Patient             Patient will benefit from skilled therapeutic intervention in order to improve the following deficits and impairments:  Abnormal gait, Decreased activity tolerance, Decreased strength, Dizziness, Improper body mechanics, Pain, Decreased balance, Decreased mobility, Difficulty walking, Impaired vision/preception, Postural dysfunction  Visit Diagnosis: Dizziness and giddiness  Other abnormalities of gait and  mobility  Unsteadiness on feet  Muscle weakness (generalized)  Other lack of coordination     Problem List Patient Active Problem List   Diagnosis Date Noted   Kidney lesion 12/14/2021   Left hip pain 12/13/2021   Memory change 11/27/2021   Fatigue 07/23/2021   Aortic atherosclerosis ([facility]) 04/17/2021   Right acute serous otitis media    Hyponatremia 02/23/2021   Prolonged QT interval 02/23/2021   Dizziness 09/12/2020  Fall 03/08/2020   Fatty liver 12/28/2019   Chest pain 07/25/2019   Frequent urinary tract infections 12/24/2018   UTI (urinary tract infection) 10/11/2018   Hypercalcemia 01/09/2017   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

## 2021-12-22 ENCOUNTER — Encounter: Payer: Medicare Other | Admitting: Speech Pathology

## 2021-12-27 ENCOUNTER — Other Ambulatory Visit: Payer: Self-pay

## 2021-12-27 ENCOUNTER — Ambulatory Visit (INDEPENDENT_AMBULATORY_CARE_PROVIDER_SITE_OTHER): Payer: Medicare Other | Admitting: *Deleted

## 2021-12-27 DIAGNOSIS — E538 Deficiency of other specified B group vitamins: Secondary | ICD-10-CM

## 2021-12-27 MED ORDER — CYANOCOBALAMIN 1000 MCG/ML IJ SOLN
1000.0000 ug | Freq: Once | INTRAMUSCULAR | Status: AC
  Administered 2021-12-27: 1000 ug via INTRAMUSCULAR

## 2021-12-27 NOTE — Progress Notes (Signed)
Pt arrived for B12 injection, given in L deltoid. Pt tolerated injection well, showed no signs of distress nor voiced any concerns.  ?

## 2021-12-28 ENCOUNTER — Ambulatory Visit: Payer: Medicare Other | Attending: Internal Medicine | Admitting: Physical Therapy

## 2021-12-28 ENCOUNTER — Encounter: Payer: Self-pay | Admitting: Physical Therapy

## 2021-12-28 DIAGNOSIS — R2681 Unsteadiness on feet: Secondary | ICD-10-CM | POA: Insufficient documentation

## 2021-12-28 DIAGNOSIS — R42 Dizziness and giddiness: Secondary | ICD-10-CM | POA: Diagnosis present

## 2021-12-28 DIAGNOSIS — R2689 Other abnormalities of gait and mobility: Secondary | ICD-10-CM | POA: Diagnosis present

## 2021-12-28 DIAGNOSIS — R278 Other lack of coordination: Secondary | ICD-10-CM | POA: Insufficient documentation

## 2021-12-28 DIAGNOSIS — M6281 Muscle weakness (generalized): Secondary | ICD-10-CM | POA: Insufficient documentation

## 2021-12-28 NOTE — Therapy (Signed)
[facility] MAIN [facility] SERVICES [address] [place], [place], [address] Phone: [phone]   Fax:  [phone]  Physical Therapy Treatment  Patient Details  Name: Crystal Lynch MRN: [id number] Date of Birth: 29-Sep-1941 Referring Provider (PT): Lonell Face, MD   Encounter Date: 12/28/2021   PT End of Session - 12/28/21 0936     Visit Number 3    Number of Visits 25    Date for PT Re-Evaluation 03/03/22    PT Start Time 0930    PT Stop Time 1015    PT Time Calculation (min) 45 min    Equipment Utilized During Treatment Gait belt    Activity Tolerance Patient tolerated treatment well    Behavior During Therapy WFL for tasks assessed/performed             Past Medical History:  Diagnosis Date   Arthritis    Chicken pox    Endometriosis    Esophagitis    s/p esophageal dilatation   GERD (gastroesophageal reflux disease)    History of ovarian cyst    Hypercholesterolemia    Hypertension    Hypothyroidism     Past Surgical History:  Procedure Laterality Date   ABDOMINAL SURGERY     found to have an ovarian cyst and endometriosis   APPENDECTOMY      There were no vitals filed for this visit.   Subjective Assessment - 12/28/21 0935     Subjective Pt reports continued dizziness over the past week. Pt reports when she was turning quickly in her den she found herself in the floor and she was abel to get up with A from her children who were home at the time. Pt reports exercises going well but STS may be exacerbating her L hip pain, pt instructed to take days off between exercise bouts if exercise is exacerbating existing problem.    Pertinent History Pt presents to PT evaluation ambulating with QC. Pt with c/o imbalance and dizziness that started >4 months ago. The pt is known to this clinic and was previously seen for vestibular therapy. Pt reports vestibular therapy did improve her symptoms some. Pt also recently seen by  Speech due to memory changes. Pt seen by ENT and followed by neurologist (Dr. Sherryll Burger). She reports recent imaging/work-up but that a cause for dizziness and imbalance has not been determined. Pt current concerns and symptoms include: difficulty with turns, bothered by bright lights, FOF, problems with her vision (has eye appointment next week d/t cataracts, per pt). 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    Limitations Standing;House hold activities;Walking;Lifting;Reading   reading limited due to difficulty focusing, thinks due to vision   How long can you sit comfortably? not affected    How long can you stand comfortably? Pt reports she will lean against something due to unsteadiness when standing    How long can you walk comfortably? Pt ambulates with QC or holds onto her husband's ([name]) arm due to unsteadiness    Diagnostic tests Per chart of MRI BRAIN 1/17 "Parenchymal volume loss and mild to moderate chronic microvascular ischemic changes"    Patient Stated Goals Pt states, "I would like to be able to jump up out of my chair like I've always done without having  to be cautious all the time, without feeling like I'm going to fall." Pt states, "I want my life back."    Currently in Pain? Yes    Pain Score 4     Pain Location Hip    Pain Orientation Left    Pain Descriptors / Indicators Dull    Pain Type Acute pain    Pain Onset More than a month ago   pt reports HA is on and off            Exercise/Activity Sets/Reps/Time/ Resistance Assistance Charge type Comments-Unless otherwise stated, CGA was provided and gait belt donned in order to ensure pt safety The following activities were completed in parallel  bars or at balance bar    LAQ  2 x 10 x 3 sec hold ( 3#)    There ex Cues for remaining minutes of end range contraction and hold time  Marching seated  2 x 10 ea ( 3#)  therex Cues for slow and controlled movements  Nustep  Level 2 x 5 min   therex Cues for maintenance of cadence as well as assistance with getting on and off machine and adjusting machine to patient  SLS progression 1 LE airex 1 LE on 4 inch step  1 LE on floor 1 LE  step  X 30 sec ea   2 x 30 sec ea  Min A NMR Unable to safely maintain balance with lower extremities on Airex pad.  With removal of Airex pad patient able to maintain with intermittent upper extremity support.  When on Airex pad patient tends to lose balance posterior neck to be corrected via min assist from author  1/2 foam roller rocks  2 x 18   There ex  Cues for utilization of ankle strategies for this exercise.  Patient tends to utilize hip strategy with posterior rocking.  With manual and tactile cues patient able to improve this with practice  Semitandem stance with head turns  2 x 30 sec ea LE   NMR Good performance with intermittent loss of balance corrected via min assist from hands-on support bar located anteriorly                                Treatment Provided this session   Pt educated throughout session about proper posture and technique with exercises. Improved exercise technique, movement at target joints, use of target muscles after min to mod verbal, visual, tactile cues. Note: Portions of this document were prepared using Dragon voice recognition software and although reviewed may contain unintentional dictation errors in syntax, grammar, or spelling.                            PT Education - 12/28/21 0936     Education Details exercise form and technique    Person(s) Educated Patient    Methods Explanation;Demonstration    Comprehension Verbalized understanding;Returned demonstration              PT Short  Term Goals - 12/09/21 1003       PT SHORT TERM GOAL #1   Title Pt will be independent with HEP in order to improve strength and balance in order to decrease fall risk and improve function at home and work.    Baseline 2/17: instructed pt to resume her previous seated VORx1 HEP, provided activity/symptom tracker handout  Time 6    Period Weeks    Status New    Target Date 01/20/22               PT Long Term Goals - 12/20/21 1109       PT LONG TERM GOAL #1   Title Patient will have improved FOTO score of 5 points or greater in order to demonstrate improvements in patient's ADLs and functional performance.    Baseline 2/17: 60%    Time 12    Period Weeks    Status New    Target Date 03/03/22      PT LONG TERM GOAL #2   Title Pt will improve ABC by at least 13% in order to demonstrate clinically significant improvement in balance confidence.    Baseline 2/17: to be assessed next 1-2 session (was 81% on 06/14/2021); 2/28: 40.3%    Time 12    Period Weeks    Status New    Target Date 03/03/22      PT LONG TERM GOAL #3   Title Patient will demonstrate reduced falls risk as evidenced by Dynamic Gait Index (DGI) 21/24 or greater.    Baseline 2/17: 13/24 (was 23/24 on 06/14/2021)    Time 12    Period Weeks    Status New    Target Date 03/03/22      PT LONG TERM GOAL #4   Title Patient will have improved gross B lower extremity to at least 4+/5 in order to improve patient's balance and mobility skills.    Baseline 2/17: grossly 4-/5    Time 12    Period Weeks    Status New    Target Date 03/03/22      PT LONG TERM GOAL #5   Title Patient will have improved gait speed to >1 m/s for improved community ambulation and gait ability.    Baseline 2/17: to be assessed next 1-2 sessions; 2/28: 0.83 m/s with QC    Time 12    Period Weeks    Status New    Target Date 03/03/22                   Plan - 12/28/21 0939     Clinical Impression Statement Patient  demonstrates good motivation for completion of physical therapy program.  Patient demonstrates high difficulty with dynamic balance tasks but did demonstrate improvement in balance with practice this session.  Patient also demonstrates weakness in her bilateral lower extremities as evidenced by difficulty with sit to stand during sessions.  Patient issues with several lower extremity strengthening exercises as well as progress with several balance exercises this session.  Patient will continue to benefit from skilled physical therapy interventions in order to improve her balance, decrease fall risk, and improve her overall quality of life.    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    Examination-Activity Limitations Locomotion Level;Carry;Lift;Hygiene/Grooming;Bend;Reach Overhead;Stairs;Stand;Transfers    Examination-Participation Restrictions Community Activity;Yard Work;Cleaning;Shop    Stability/Clinical Decision Making Evolving/Moderate complexity    Rehab Potential Good    PT Frequency 2x / week    PT Duration 12 weeks    PT Treatment/Interventions ADLs/Self Care Home Management;Aquatic Therapy;Biofeedback;Canalith Repostioning;Cryotherapy;Electrical Stimulation;Moist Heat;Traction;Ultrasound;DME Instruction;Gait training;Stair  training;Functional mobility training;Therapeutic activities;Therapeutic exercise;Balance training;Neuromuscular re-education;Patient/family education;Orthotic Fit/Training;Wheelchair mobility training;Manual techniques;Passive range of motion;Energy conservation;Taping;Vestibular;Visual/perceptual  remediation/compensation;Splinting;Spinal Manipulations;Joint Manipulations;Dry needling    PT Next Visit Plan Complete further assessment, including , balance strength, review VOR    PT Home Exercise Plan Instructed pt to resume previous seated VORx1 HEP (provided when pt in vestibular therapy), provided pt with symptom/activity tracker handout. Will advance HEP;Access Code: [email or web address]    Consulted and Agree with Plan of Care Patient             Patient will benefit from skilled therapeutic intervention in order to improve the following deficits and impairments:  Abnormal gait, Decreased activity tolerance, Decreased strength, Dizziness, Improper body mechanics, Pain, Decreased balance, Decreased mobility, Difficulty walking, Impaired vision/preception, Postural dysfunction  Visit Diagnosis: Unsteadiness on feet  Muscle weakness (generalized)  Other lack of coordination     Problem List Patient Active Problem List   Diagnosis Date Noted   Kidney lesion 12/14/2021   Left hip pain 12/13/2021   Memory change 11/27/2021   Fatigue 07/23/2021   Aortic atherosclerosis ([facility]) 04/17/2021   Right acute serous otitis media    Hyponatremia 02/23/2021   Prolonged QT interval 02/23/2021   Dizziness 09/12/2020   Fall 03/08/2020   Fatty liver 12/28/2019   Chest pain 07/25/2019   Frequent urinary tract infections 12/24/2018   UTI (urinary tract infection) 10/11/2018   Hypercalcemia 01/09/2017   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

## 2021-12-29 ENCOUNTER — Encounter: Payer: Medicare Other | Admitting: Speech Pathology

## 2022-01-03 ENCOUNTER — Other Ambulatory Visit: Payer: Self-pay

## 2022-01-03 ENCOUNTER — Ambulatory Visit: Payer: Medicare Other | Admitting: Physical Therapy

## 2022-01-03 ENCOUNTER — Encounter: Payer: Self-pay | Admitting: Physical Therapy

## 2022-01-03 DIAGNOSIS — R2681 Unsteadiness on feet: Secondary | ICD-10-CM

## 2022-01-03 DIAGNOSIS — R42 Dizziness and giddiness: Secondary | ICD-10-CM

## 2022-01-03 DIAGNOSIS — M6281 Muscle weakness (generalized): Secondary | ICD-10-CM

## 2022-01-03 DIAGNOSIS — R278 Other lack of coordination: Secondary | ICD-10-CM

## 2022-01-03 DIAGNOSIS — R2689 Other abnormalities of gait and mobility: Secondary | ICD-10-CM

## 2022-01-03 NOTE — Therapy (Signed)
[facility] ?[place], [place], [address] ?Phone: [phone]   Fax:  [phone] ? ?Physical Therapy Treatment ? ?Patient Details  ?Name: Crystal Lynch ?MRN: [id number] ?Date of Birth: 1941/10/22 ?Referring Provider (PT): Vladimir Crofts, MD ? ? ?Encounter Date: 01/03/2022 ? ? PT End of Session - 01/03/22 1027   ? ? Visit Number 4   ? Number of Visits 25   ? Date for PT Re-Evaluation 03/03/22   ? PT Start Time 1020   ? PT Stop Time 1100   ? PT Time Calculation (min) 40 min   ? Equipment Utilized During Treatment Gait belt   ? Activity Tolerance Patient tolerated treatment well   ? Behavior During Therapy [facility] for tasks assessed/performed   ? ?  ?  ? ?  ? ? ?Past Medical History:  ?Diagnosis Date  ? Arthritis   ? Chicken pox   ? Endometriosis   ? Esophagitis   ? s/p esophageal dilatation  ? GERD (gastroesophageal reflux disease)   ? History of ovarian cyst   ? Hypercholesterolemia   ? Hypertension   ? Hypothyroidism   ? ? ?Past Surgical History:  ?Procedure Laterality Date  ? ABDOMINAL SURGERY    ? found to have an ovarian cyst and endometriosis  ? APPENDECTOMY    ? ? ?There were no vitals filed for this visit. ? ? Subjective Assessment - 01/03/22 1026   ? ? Subjective Patient reports still feeling unsteady. She reports still feeling dizzy. "I stay dizzy just about all the time." She reports no new stumbles or falls.   ? Pertinent History Pt presents to PT evaluation ambulating with QC. Pt with c/o imbalance and dizziness that started >4 months ago. The pt is known to this clinic and was previously seen for vestibular therapy. Pt reports vestibular therapy did improve her symptoms some. Pt also recently seen by Speech due to memory changes. Pt seen by ENT and followed by neurologist (Dr. Manuella Ghazi). She reports recent imaging/work-up but that a cause for dizziness and imbalance has not been determined. Pt current concerns and symptoms include:  difficulty with turns, bothered by bright lights, FOF, problems with her vision (has eye appointment next week d/t cataracts, per pt). 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   ? Limitations Standing;House hold activities;Walking;Lifting;Reading   reading limited due to difficulty focusing, thinks due to vision  ? How long can you sit comfortably? not affected   ? How long can you stand comfortably? Pt reports she will lean against something due to unsteadiness when standing   ? How long can you walk comfortably? Pt ambulates with QC or holds onto her husband's ([name]) arm due to unsteadiness   ? 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 Patient Stated Goals Pt states, "I would like to be able to jump up out of my chair like I've always done without having to be cautious all the time, without feeling like I'm going to fall." Pt states, "I want my life back."   ? Currently in Pain? Yes   ? Pain Score 4    ? Pain Location Hip   ? Pain Orientation Left   ?  Pain Descriptors / Indicators Aching;Dull   ? Pain Type Chronic pain   ? Pain Onset More than a month ago   pt reports HA is on and off  ? Pain Frequency Intermittent   ? Aggravating Factors  worse with sit to stand, laying on it   ? Pain Relieving Factors tylenol/steroid injections   ? Effect of Pain on Daily Activities decreased activity tolerance;   ? Multiple Pain Sites No   ? ?  ?  ? ?  ? ? ? ?  ?Exercise/Activity Sets/Reps/Time/ Resistance Assistance Charge type Comments-Unless otherwise stated, CGA was provided and gait belt donned in order to ensure pt safety ?The following activities  were completed in parallel bars or at balance bar  ?   ?LAQ  1 x 10 x 3 sec hold ( 3#)     There ex Cues for remaining minutes of end range contraction and hold time, also cues for increased ankle DF for better ROM/stretch;  ?Marching seated  1 x 10 ea ( 3#)   therex Cues for slow and controlled movements  ?Progressed to seated March, leg lift over 1/2 bolster   3# x10 reps each LE   therex Cues for  proper positioning; reports moderate soreness on LLE during exercise due to bursitis;   ?Seated piriformis stretch 20 sec hold x1 rep each LE  therex Reports moderate tenderness/stretch on LLE  ?      ?      ?      ?           ?           ?           ?           ?           ? ?NMR: ?Standing on 1/2 bolster: ?Heel/toe rock x15 reps with BUE rail assist, min VCS to avoid trunk lean to increase ankle ROM ?-feet apart neutral position ?Unsupported standing 20 sec hold, no sway, reports minimal difficulty ?Mini squat with BUE rail assist x10 reps with min VCS for proper positioning ?-forward/backward step over 1/2 bolster x10 reps with 1 rail assist, able to progress to no rail when stepping forward ?-side step over 1/2 bolster x10 reps with 1 rail assist, reports moderate difficulty with slight soreness in LLE hip; ? ? ?Standing on rockerboard: ?Feet apart: forward/backward rock x1 min with 2-1 rail assist ?Feet staggered (one leg forward/one leg backward) forward/backward rock with 2-1-0 rail assist, min A for safety ?Standing with incline forward (decline) 30 sec hold, minimal difficulty ?Standing on incline with feet apart, 30 sec hold, intermittent UE assist, moderate difficulty, min A with decreased ankle/hip strategies and heavy posterior loss of balance;  ? ? ? ?Patient tolerated session well. She denies any increase in pain but does report her LLE hip is always sore. Patient does exhibit decreased ankle strategies often relying on UE to reposition self when experiencing loss of balance ?Reinforced  HEP ? ? ? ? ? ? ? ? ? ? ? ? ? ? ? ? ? ? ? ? ? ? PT Short Term Goals - 12/09/21 1003   ? ?  ? PT SHORT TERM GOAL #1  ? Title Pt will be independent with HEP in order to improve strength and balance in order to decrease fall risk and improve function at home and work.   ? Baseline 2/17: instructed pt to resume her previous seated  VORx1 HEP, provided activity/symptom tracker handout   ? Time 6   ? Period Weeks   ? Status New   ? Target Date 01/20/22   ? ?  ?  ? ?  ? ? ? ? PT Long Term Goals - 12/20/21 1109   ? ?  ? PT LONG TERM GOAL #1  ? Title Patient will have improved FOTO score of 5 points or greater in order to demonstrate improvements in patient's ADLs and functional performance.   ? Baseline 2/17: 60%   ? Time 12   ? Period Weeks   ? Status New   ? Target Date 03/03/22   ?  ? PT LONG TERM GOAL #2  ? Title Pt will improve ABC by at least 13% in order to demonstrate clinically significant improvement in balance confidence.   ? Baseline 2/17: to be assessed next 1-2 session (was 81% on 06/14/2021); 2/28: 40.3%   ? Time 12   ? Period Weeks   ? Status New   ? Target Date 03/03/22   ?  ? PT LONG TERM GOAL #3  ? Title Patient will demonstrate reduced falls risk as evidenced by Dynamic Gait Index (DGI) 21/24 or greater.   ? Baseline 2/17: 13/24 (was 23/24 on 06/14/2021)   ? Time 12   ? Period Weeks   ? Status New   ? Target Date 03/03/22   ?  ? PT LONG TERM GOAL #4  ? Title Patient will have improved gross B lower extremity to at least 4+/5 in order to improve patient's balance and mobility skills.   ? Baseline 2/17: grossly 4-/5   ? Time 12   ? Period Weeks   ? Status New   ? Target Date 03/03/22   ?  ? PT LONG TERM GOAL #5  ? Title Patient will have improved 61mt gait speed to >1 m/s for improved community ambulation and gait ability.   ? Baseline 2/17: to be assessed next 1-2 sessions; 2/28: 0.83 m/s with QC   ? Time 12   ? Period Weeks   ? Status New   ? Target Date 03/03/22   ? ?  ?  ? ?  ? ? ? ? ? ? ? ? Plan -  01/03/22 1104   ? ? Clinical Impression Statement Patient motivated and participated well within session. She was instructed in LE strengthening at start of session to help wake up muscles/incresae ROM. Patient does report increase

## 2022-01-05 ENCOUNTER — Ambulatory Visit: Payer: Medicare Other | Admitting: Physical Therapy

## 2022-01-05 ENCOUNTER — Other Ambulatory Visit: Payer: Self-pay

## 2022-01-05 ENCOUNTER — Encounter: Payer: Self-pay | Admitting: Physical Therapy

## 2022-01-05 ENCOUNTER — Encounter: Payer: Medicare Other | Admitting: Speech Pathology

## 2022-01-05 NOTE — Therapy (Signed)
[place] ?[place], [place], [address] ?Phone: [phone]   Fax:  [phone] ? ?Physical Therapy Treatment ? ?Patient Details  ?Name: Crystal Lynch ?MRN: [id number] ?Date of Birth: 1941-08-08 ?Referring Provider (PT): Vladimir Crofts, MD ? ? ?Encounter Date: 01/05/2022 ? ? PT End of Session - 01/05/22 1125   ? ? Visit Number 5   ? Number of Visits 25   ? Date for PT Re-Evaluation 03/03/22   ? PT Start Time 1937   ? PT Stop Time 9024   ? PT Time Calculation (min) 45 min   ? Equipment Utilized During Treatment Gait belt   ? Activity Tolerance Patient tolerated treatment well   ? Behavior During Therapy [facility] for tasks assessed/performed   ? ?  ?  ? ?  ? ? ?Past Medical History:  ?Diagnosis Date  ? Arthritis   ? Chicken pox   ? Endometriosis   ? Esophagitis   ? s/p esophageal dilatation  ? GERD (gastroesophageal reflux disease)   ? History of ovarian cyst   ? Hypercholesterolemia   ? Hypertension   ? Hypothyroidism   ? ? ?Past Surgical History:  ?Procedure Laterality Date  ? ABDOMINAL SURGERY    ? found to have an ovarian cyst and endometriosis  ? APPENDECTOMY    ? ? ?There were no vitals filed for this visit. ? ? Subjective Assessment - 01/05/22 1124   ? ? 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 Pt has not had any scheduled time fram for this yet but is encouraged by potential for surgical relief.   ? Pertinent History Pt presents to PT evaluation ambulating with QC. Pt with c/o imbalance and dizziness that started >4 months ago. The pt is known to this clinic and was previously seen for vestibular therapy. Pt reports vestibular therapy did improve her symptoms some. Pt also recently seen by Speech due to memory changes. Pt seen by ENT and followed by neurologist (Dr. Manuella Ghazi). She reports recent  imaging/work-up but that a cause for dizziness and imbalance has not been determined. Pt current concerns and symptoms include: difficulty with turns, bothered by bright lights, FOF, problems with her vision (has eye appointment next week d/t cataracts, per pt). 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   ? Limitations Standing;House hold activities;Walking;Lifting;Reading   reading limited due to difficulty focusing, thinks due to vision  ? How long can you sit comfortably? not affected   ? How long can you stand comfortably? Pt reports she will lean against something due to unsteadiness when standing   ? How long can you walk comfortably? Pt ambulates with QC or holds onto her husband's ([name]) arm due to unsteadiness   ? 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 Patient Stated Goals Pt states, "I would like to be able to jump up out of my chair like I've always done without having to be cautious all the time, without feeling like I'm going to fall." Pt states, "I want my  life back."   ? Currently in Pain? Yes   ? Pain Score 2    ? Pain Location Hip   ? Pain Orientation Left   ? Pain Descriptors / Indicators Aching   ? Pain Type Chronic pain   ? Pain Onset More than a month ago   pt reports HA is on and off  ? ?  ?  ? ?  ? ? ?Exercise/Activity Sets/Reps/Time/ Resistance Assistance Charge type Comments-Unless otherwise stated, CGA was provided and gait belt donned in order to ensure pt safety ?The following activities were completed in parallel bars or at balance bar  ?  ?LAQ  2 x 10 x 3 sec hold ( 3#)    There ex Cues for remaining minutes of  end range contraction and hold time  ?Marching  standing 2 x 10 ea ( 3#) UE  therex Cues for slow and controlled movements  ?Nustep  Level 2 x 5 min   therex Cues for maintenance of cadence as well as assistance with getting on and off machine and adjusting machine to patient  ? ?1 LE on floor 1 LE  step   ? ?2 x 40 sec ea  Min A NMR Min sway this session   ?1/2 foam roller rocks  2 x 18  cga There ex  Cues for utilization of ankle strategies for this exercise.  Patient tends to utilize hip strategy with posterior rocking.  With manual and tactile cues patient able to improve this with practice  ?Semitandem stance with head turns  2 x 30 sec ea LE  cga NMR Good performance with intermittent loss of balance corrected via min assist from hands-on support bar located anteriorly  ?Standing HS curl 2*10 UE Therex    ?      ?Kore balance games level 3 floor stability  5 trials with dot popping game UE Nmr  Significant difficulty reaching edge of area ( shifting weight to edge of BOS) but improvement with practice  ?Mini squat  2x 10   Therex  Cues for hip hinge for proper form   ?Switching feet on PT cue ( 1 LE on floor, 1 on step)  *2 min   Nmr  Cues for switching feet, 1 LOB with   ?Treatment Provided this session  ? ?Pt educated throughout session about proper posture and technique with exercises. Improved exercise technique, movement at target joints, use of target muscles after min to mod verbal, visual, tactile cues. ?Note: Portions of this document were prepared using Dragon voice recognition software and although reviewed may contain unintentional dictation errors in syntax, grammar, or spelling. ? ? ? ? ? ? ? ? ? ? ? ? ? ? ? ? ? ? ? ? ? ? ? ? ? ? ? ? PT Education - 01/05/22 1124   ? ? Education Details Exercise form and technique   ? Person(s) Educated Patient   ? Methods Explanation   ? Comprehension Verbalized understanding   ? ?  ?  ? ?  ? ? ? PT Short Term Goals - 12/09/21 1003   ? ?  ? PT SHORT TERM GOAL #1   ? Title Pt will be independent with HEP in order to improve strength and balance in order to decrease fall risk and improve function at home and work.   ? Baseline 2/17: instructed pt to resume her previous seated VORx1 HEP, provided activity/symptom tracker handout   ? Time 6   ?  Period Weeks   ? Status New   ? Target Date 01/20/22   ? ?  ?  ? ?  ? ? ? ? PT Long Term Goals - 12/20/21 1109   ? ?  ? PT LONG TERM GOAL #1  ? Title Patient will have improved FOTO score of 5 points or greater in order to demonstrate improvements in patient's ADLs and functional performance.   ? Baseline 2/17: 60%   ? Time 12   ? Period Weeks   ? Status New   ? Target Date 03/03/22   ?  ? PT LONG TERM GOAL #2  ? Title Pt will improve ABC by at least 13% in order to demonstrate clinically significant improvement in balance confidence.   ? Baseline 2/17: to be assessed next 1-2 session (was 81% on 06/14/2021); 2/28: 40.3%   ? Time 12   ? Period Weeks   ? Status New   ? Target Date 03/03/22   ?  ? PT LONG TERM GOAL #3  ? Title Patient will demonstrate reduced falls risk as evidenced by Dynamic Gait Index (DGI) 21/24 or greater.   ? Baseline 2/17: 13/24 (was 23/24 on 06/14/2021)   ? Time 12   ? Period Weeks   ? Status New   ? Target Date 03/03/22   ?  ? PT LONG TERM GOAL #4  ? Title Patient will have improved gross B lower extremity to at least 4+/5 in order to improve patient's balance and mobility skills.   ? Baseline 2/17: grossly 4-/5   ? Time 12   ? Period Weeks   ? Status New   ? Target Date 03/03/22   ?  ? PT LONG TERM GOAL #5  ? Title Patient will have improved 90mt gait speed to >1 m/s for improved community ambulation and gait ability.   ? Baseline 2/17: to be assessed next 1-2 sessions; 2/28: 0.83 m/s with QC   ? Time 12   ? Period Weeks   ? Status New   ? Target Date 03/03/22   ? ?  ?  ? ?  ? ? ? ? ? ? ? ? Plan - 01/05/22 1125   ? ? Clinical Impression Statement Patient demonstrates good motivation for completion of physical  therapy program. Patient demonstrates  improvement in balance with practice this session. Patient progressed with several LE strengthening exercises this session with progression to primarally standing exercises. Pa

## 2022-01-09 ENCOUNTER — Ambulatory Visit: Payer: Medicare Other | Admitting: Internal Medicine

## 2022-01-10 ENCOUNTER — Ambulatory Visit (INDEPENDENT_AMBULATORY_CARE_PROVIDER_SITE_OTHER): Payer: Medicare Other | Admitting: Internal Medicine

## 2022-01-10 ENCOUNTER — Other Ambulatory Visit: Payer: Self-pay

## 2022-01-10 DIAGNOSIS — N289 Disorder of kidney and ureter, unspecified: Secondary | ICD-10-CM

## 2022-01-10 DIAGNOSIS — K760 Fatty (change of) liver, not elsewhere classified: Secondary | ICD-10-CM

## 2022-01-10 DIAGNOSIS — R42 Dizziness and giddiness: Secondary | ICD-10-CM

## 2022-01-10 DIAGNOSIS — I10 Essential (primary) hypertension: Secondary | ICD-10-CM

## 2022-01-10 DIAGNOSIS — E871 Hypo-osmolality and hyponatremia: Secondary | ICD-10-CM

## 2022-01-10 DIAGNOSIS — E7800 Pure hypercholesterolemia, unspecified: Secondary | ICD-10-CM

## 2022-01-10 DIAGNOSIS — I700 Atherosclerosis of aorta: Secondary | ICD-10-CM

## 2022-01-10 DIAGNOSIS — E039 Hypothyroidism, unspecified: Secondary | ICD-10-CM

## 2022-01-10 DIAGNOSIS — N3000 Acute cystitis without hematuria: Secondary | ICD-10-CM

## 2022-01-10 DIAGNOSIS — R413 Other amnesia: Secondary | ICD-10-CM

## 2022-01-10 NOTE — Progress Notes (Signed)
Patient ID: Crystal Lynch, female   DOB: Mar 25, 1941, 81 y.o.   MRN: [id number] ? ? ?Subjective:  ? ? Patient ID: Crystal Lynch, female    DOB: Mar 21, 1941, 81 y.o.   MRN: [id number] ? ?This visit occurred during the SARS-CoV-2 public health emergency.  Safety protocols were in place, including screening questions prior to the visit, additional usage of staff PPE, and extensive cleaning of exam room while observing appropriate contact time as indicated for disinfecting solutions.  ? ?Patient here for a scheduled follow up.  ? ?Chief Complaint  ?Patient presents with  ? Gait Problem  ? .  ? ?HPI ?Here to follow up regarding memory loss and imbalance.  Seeing neurology.  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  Some persistent dizziness and imbalance s/p vestibular therapy and ENT evaluation.  39% left caloric weakness noted on VNG test.  Recommended continuing B12 supplements and PAP therapy - OSA.  Also concern raised regarding silent stroke - continue aspirin and crestor.  She continues with physical therapy.  Still having headaches.  Still with intermittent dizziness.  Saw ophthalmology.  Using pataday.  Helps eyes.  Eyes red and watering.  No vision loss.  She recently had f/u with Dr Lanora Manis.  Was on spironolactone 12.'5mg'$  q day and one liter fluid restriction.  Sodium noted to be improved.  He had recommended continuing the spironolactone at 12.'5mg'$  q day, but recommended holding while on bactrim.  She misunderstood and has been off aldactone.  Blood pressure elevated today.  Discussed restarting.  No chest pain.  Eating.  No vomiting. No abdominal pain.  Bowels moving.  ? ? ?Past Medical History:  ?Diagnosis Date  ? Arthritis   ? Chicken pox   ? Endometriosis   ? Esophagitis   ? s/p esophageal dilatation  ? GERD (gastroesophageal reflux disease)   ? History of ovarian cyst   ? Hypercholesterolemia   ? Hypertension   ? Hypothyroidism   ? ?Past Surgical History:  ?Procedure Laterality Date  ?  ABDOMINAL SURGERY    ? found to have an ovarian cyst and endometriosis  ? APPENDECTOMY    ? ?Family History  ?Problem Relation Age of Onset  ? Lung disease Father   ? Heart attack Father   ? Heart disease Mother   ?     myocardial infarction  ? Breast cancer Mother 62  ? Heart attack Mother   ? Hypertension Mother   ? Hyperlipidemia Mother   ? Breast cancer Sister 29  ? Hypertension Sister   ? Rheum arthritis Sister   ? Breast cancer Maternal Aunt 26  ? Hypertension Brother   ? Colon cancer Neg Hx   ? ?Social History  ? ?Socioeconomic History  ? Marital status: Married  ?  Spouse name: Not on file  ? Number of children: Not on file  ? Years of education: Not on file  ? Highest education level: Not on file  ?Occupational History  ? Not on file  ?Tobacco Use  ? Smoking status: Never  ? Smokeless tobacco: Never  ?Vaping Use  ? Vaping Use: Never used  ?Substance and Sexual Activity  ? Alcohol use: No  ?  Alcohol/week: 0.0 standard drinks  ? Drug use: No  ? Sexual activity: Never  ?Other Topics Concern  ? Not on file  ?Social History Narrative  ? Not on file  ? ?Social Determinants of Health  ? ?Financial Resource Strain: Low Risk   ? Difficulty of Paying  Living Expenses: Not hard at all  ?Food Insecurity: No Food Insecurity  ? Worried About Charity fundraiser in the Last Year: Never true  ? Ran Out of Food in the Last Year: Never true  ?Transportation Needs: No Transportation Needs  ? Lack of Transportation (Medical): No  ? Lack of Transportation (Non-Medical): No  ?Physical Activity: Sufficiently Active  ? Days of Exercise per Week: 5 days  ? Minutes of Exercise per Session: 30 min  ?Stress: No Stress Concern Present  ? Feeling of Stress : Not at all  ?Social Connections: Unknown  ? Frequency of Communication with Friends and Family: More than three times a week  ? Frequency of Social Gatherings with Friends and Family: More than three times a week  ? Attends Religious Services: More than 4 times per year  ? Active  Member of Clubs or Organizations: Not on file  ? Attends Archivist Meetings: Not on file  ? Marital Status: Married  ? ? ? ?Review of Systems  ?Constitutional:  Negative for appetite change and unexpected weight change.  ?HENT:  Negative for congestion and sinus pressure.   ?Respiratory:  Negative for cough, chest tightness and shortness of breath.   ?Cardiovascular:  Negative for chest pain, palpitations and leg swelling.  ?Gastrointestinal:  Negative for abdominal pain, diarrhea, nausea and vomiting.  ?Genitourinary:  Negative for difficulty urinating and dysuria.  ?Musculoskeletal:  Negative for joint swelling and myalgias.  ?Skin:  Negative for color change and rash.  ?Neurological:  Positive for dizziness, light-headedness and headaches.  ?Psychiatric/Behavioral:  Negative for agitation and dysphoric mood.   ? ?   ?Objective:  ?  ? ?BP 136/78   Pulse 77   Temp 97.9 ?F (36.6 ?C)   Resp 16   Ht '5\' 2"'$  (1.575 m)   Wt 132 lb 6.4 oz (60.1 kg)   LMP 10/21/1984   SpO2 99%   BMI 24.22 kg/m?  ?Wt Readings from Last 3 Encounters:  ?01/10/22 132 lb 6.4 oz (60.1 kg)  ?11/14/21 127 lb 12.8 oz (58 kg)  ?09/09/21 131 lb (59.4 kg)  ? ? ?Physical Exam ?Vitals reviewed.  ?Constitutional:   ?   General: She is not in acute distress. ?   Appearance: Normal appearance.  ?HENT:  ?   Head: Normocephalic and atraumatic.  ?   Right Ear: External ear normal.  ?   Left Ear: External ear normal.  ?Eyes:  ?   General: No scleral icterus.    ?   Right eye: No discharge.     ?   Left eye: No discharge.  ?   Conjunctiva/sclera: Conjunctivae normal.  ?Neck:  ?   Thyroid: No thyromegaly.  ?Cardiovascular:  ?   Rate and Rhythm: Normal rate and regular rhythm.  ?Pulmonary:  ?   Effort: No respiratory distress.  ?   Breath sounds: Normal breath sounds. No wheezing.  ?Abdominal:  ?   General: Bowel sounds are normal.  ?   Palpations: Abdomen is soft.  ?   Tenderness: There is no abdominal tenderness.  ?Musculoskeletal:     ?    General: No swelling or tenderness.  ?   Cervical back: Neck supple. No tenderness.  ?Lymphadenopathy:  ?   Cervical: No cervical adenopathy.  ?Skin: ?   Findings: No erythema or rash.  ?Neurological:  ?   Mental Status: She is alert.  ?Psychiatric:     ?   Mood and Affect: Mood normal.     ?  Behavior: Behavior normal.  ? ? ? ?Outpatient Encounter Medications as of 01/10/2022  ?Medication Sig  ? aspirin 81 MG EC tablet Take 1 tablet (81 mg total) by mouth daily. Swallow whole.  ? Biotin 5 MG CAPS Take 5,000 mcg by mouth daily.  ? carvedilol (COREG) 6.25 MG tablet TAKE 1 TABLET TWICE A DAY  ? cholecalciferol (VITAMIN D) 25 MCG (1000 UNIT) tablet Take 1,000 Units by mouth daily.  ? dorzolamide-timolol (COSOPT) 22.3-6.8 MG/ML ophthalmic solution Place 1 drop into both eyes 2 (two) times daily.  ? levothyroxine (SYNTHROID) 75 MCG tablet TAKE 1 TABLET BY MOUTH EVERY DAY  ? lisinopril (ZESTRIL) 20 MG tablet Take 1 tablet (20 mg total) by mouth daily.  ? Multiple Vitamins-Minerals (ZINC PO) Take 1 tablet by mouth daily.  ? pantoprazole (PROTONIX) 40 MG tablet TAKE 1 TABLET BY MOUTH EVERY DAY (WAITING ON PA)  ? rosuvastatin (CRESTOR) 10 MG tablet TAKE 1 TABLET BY MOUTH EVERY DAY  ? [DISCONTINUED] cefdinir (OMNICEF) 300 MG capsule Take 1 capsule (300 mg total) by mouth 2 (two) times daily.  ? [DISCONTINUED] spironolactone (ALDACTONE) 25 MG tablet Take 0.5 tablets (12.5 mg total) by mouth daily.  ? ?No facility-administered encounter medications on file as of 01/10/2022.  ?  ? ?Lab Results  ?Component Value Date  ? WBC 7.9 03/02/2021  ? HGB 14.4 03/02/2021  ? HCT 42.4 03/02/2021  ? PLT 297.0 03/02/2021  ? GLUCOSE 103 (H) 11/14/2021  ? CHOL 143 11/14/2021  ? TRIG 154.0 (H) 11/14/2021  ? HDL 49.10 11/14/2021  ? LDLDIRECT 88.0 03/22/2021  ? [place] 63 11/14/2021  ? ALT 20 11/14/2021  ? AST 16 11/14/2021  ? NA 136 11/14/2021  ? K 3.9 11/14/2021  ? CL 99 11/14/2021  ? CREATININE 0.66 11/14/2021  ? BUN 13 11/14/2021  ? CO2 28  11/14/2021  ? TSH 2.06 11/14/2021  ? INR 1.0 08/12/2015  ? ? ?US Abdomen Complete ? ?Result Date: 11/30/2021 ?CLINICAL DATA:  Hyperbilirubinemia. EXAM: ABDOMEN ULTRASOUND COMPLETE COMPARISON:  Ultrasound 11/26/2019.  CT 07/

## 2022-01-12 ENCOUNTER — Encounter: Payer: Medicare Other | Admitting: Speech Pathology

## 2022-01-13 ENCOUNTER — Ambulatory Visit: Payer: Medicare Other

## 2022-01-13 ENCOUNTER — Other Ambulatory Visit: Payer: Self-pay

## 2022-01-13 DIAGNOSIS — M6281 Muscle weakness (generalized): Secondary | ICD-10-CM

## 2022-01-13 DIAGNOSIS — R42 Dizziness and giddiness: Secondary | ICD-10-CM

## 2022-01-13 DIAGNOSIS — R2681 Unsteadiness on feet: Secondary | ICD-10-CM

## 2022-01-13 DIAGNOSIS — R278 Other lack of coordination: Secondary | ICD-10-CM

## 2022-01-13 DIAGNOSIS — R2689 Other abnormalities of gait and mobility: Secondary | ICD-10-CM

## 2022-01-13 NOTE — Therapy (Signed)
[facility] ?[place], [place], [address] ?Phone: [phone]   Fax:  [phone] ? ?Physical Therapy Treatment ? ?Patient Details  ?Name: Crystal Lynch ?MRN: [id number] ?Date of Birth: [birth year] ?Referring Provider (PT): Vladimir Crofts, MD ? ? ?Encounter Date: 01/13/2022 ? ? PT End of Session - 01/13/22 1122   ? ? Visit Number 6   ? Number of Visits 25   ? Date for PT Re-Evaluation 03/03/22   ? PT Start Time [phone]   ? PT Stop Time 1015   ? PT Time Calculation (min) 39 min   ? Equipment Utilized During Treatment Gait belt   ? Activity Tolerance Patient tolerated treatment well   ? Behavior During Therapy [facility] for tasks assessed/performed   ? ?  ?  ? ?  ? ? ?Past Medical History:  ?Diagnosis Date  ? Arthritis   ? Chicken pox   ? Endometriosis   ? Esophagitis   ? s/p esophageal dilatation  ? GERD (gastroesophageal reflux disease)   ? History of ovarian cyst   ? Hypercholesterolemia   ? Hypertension   ? Hypothyroidism   ? ? ?Past Surgical History:  ?Procedure Laterality Date  ? ABDOMINAL SURGERY    ? found to have an ovarian cyst and endometriosis  ? APPENDECTOMY    ? ? ?There were no vitals filed for this visit. ? ? Subjective Assessment - 01/13/22 0939   ? ? Subjective Pt reports some L hip pain d/t bursitis. She reports no stumbles/falls.   ? Pertinent History Pt presents to PT evaluation ambulating with QC. Pt with c/o imbalance and dizziness that started >4 months ago. The pt is known to this clinic and was previously seen for vestibular therapy. Pt reports vestibular therapy did improve her symptoms some. Pt also recently seen by Speech due to memory changes. Pt seen by ENT and followed by neurologist (Dr. Manuella Ghazi). She reports recent imaging/work-up but that a cause for dizziness and imbalance has not been determined. Pt current concerns and symptoms include: difficulty with turns, bothered by bright lights, FOF, problems with her vision (has eye  appointment next week d/t cataracts, per pt). 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   ? Limitations Standing;House hold activities;Walking;Lifting;Reading   reading limited due to difficulty focusing, thinks due to vision  ? How long can you sit comfortably? not affected   ? How long can you stand comfortably? Pt reports she will lean against something due to unsteadiness when standing   ? How long can you walk comfortably? Pt ambulates with QC or holds onto her husband's ([name]) arm due to unsteadiness   ? 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 Patient Stated Goals Pt states, "I would like to be able to jump up out of my chair like I've always done without having to be cautious all the time, without feeling like I'm going to fall." Pt states, "I want my life back."   ? Currently in Pain? Yes   ? Pain Score 5    ? Pain Location Hip   ? Pain Orientation Left   ? Pain Onset More than a month ago   pt  reports HA is on and off  ? ?  ?  ? ?  ? ? ? ?  ?Exercise/Activity Sets/Reps/Time/ Resistance Assistance Charge type Comments-Unless otherwise stated, CGA was provided and gait belt donned in order to ensure pt safety ?The following activities were completed by a support surface ?   ?LAQ  2 x 15( 3#)     There ex Pt rates as easy  ?Marching  standing 2 x 15 ea ( 3#) UE  therex Cues for slow and controlled movements  ?STS 1x10, 1x5 UEs therex Pt rates as medium-hard  ?Walking and turning/pivoting 10x turning to R and L sides over 10 meters CGA NMR Only slight unsteadiness  ?Body rolls against wall for turning 4 rounds Close CGA-min a NMR  Mild  unsteadiness  ?      ?  ?1 LE on floor 1 LE  step    ? 2x30 esc each LE CGA  NMR Min sway   ?      ?Semitandem stance with head turns vertical, and horizontal Multiple reps of each, performed with LE in each position. Cga-min a NMR Pt with increased ankle righting, requires CGA-min a  ?NBOS on airex with EC 2x90 sec Min a NMR Significant sway, mostly to R side and posterior  ? ? ?LUE BP: 166/62 mmHg HR 62 bpm. BP assessed as pt reported feeling briefly lightheaded. This improved with rest.  ? ? ? ?Pt educated throughout session about proper posture and technique with exercises. Improved exercise technique, movement at target joints, use of target muscles after min to mod verbal, visual, tactile cues. ? ? ? PT Education - 01/13/22 1122   ? ? Education Details exercise technique, body mechanics   ? Person(s) Educated Patient   ? Methods Explanation;Demonstration;Verbal cues;Tactile cues   ? Comprehension Verbalized understanding;Returned demonstration;Need further instruction;Verbal cues required   ? ?  ?  ? ?  ? ? ? PT Short Term Goals - 12/09/21 1003   ? ?  ? PT SHORT TERM GOAL #1  ? Title Pt will be independent with HEP in order to improve strength and balance in order to decrease fall risk and improve function at home and work.   ? Baseline 2/17: instructed pt to resume her previous seated VORx1 HEP, provided activity/symptom tracker handout   ? Time 6   ? Period Weeks   ? Status New   ? Target Date 01/20/22   ? ?  ?  ? ?  ? ? ? ? PT Long Term Goals - 12/20/21 1109   ? ?  ? PT LONG TERM GOAL #1  ? Title Patient will have improved FOTO score of 5 points or greater in order to demonstrate improvements in patient's ADLs and functional performance.   ? Baseline 2/17: 60%   ? Time 12   ? Period Weeks   ? Status New   ? Target Date 03/03/22   ?  ? PT LONG TERM GOAL #2  ? Title Pt will improve ABC by at least 13% in order to demonstrate clinically significant improvement in balance confidence.   ? Baseline 2/17: to be  assessed next 1-2 session (was 81% on 06/14/2021); 2/28: 40.3%   ? Time 12   ? Period Weeks   ? Status New   ? Target Date 03/03/22   ?  ? PT LONG TERM GOAL #3  ? Title Patient will demonstrate reduced falls risk as evidenced by Dynamic Gait Index (DGI) 21/24 or  greater.   ? Baseline 2/17: 13/24 (was 23/24 on 06/14/2021)   ? Time 12   ? Period Weeks   ? Status New   ? Target Date 03/03/22   ?  ? PT LONG TERM GOAL #4  ? Title Patient will have improved gross B lower extremity to at least 4+/5 in order to improve patient's balance and mobility skills.   ? Baseline 2/17: grossly 4-/5   ? Time 12   ? Period Weeks   ? Status New   ? Target Date 03/03/22   ?  ? PT LONG TERM GOAL #5  ? Title Patient will have improved 104mt gait speed to >1 m/s for improved community ambulation and gait ability.   ? Baseline 2/17: to be assessed next 1-2 sessions; 2/28: 0.83 m/s with QC   ? Time 12   ? Period Weeks   ? Status New   ? Target Date 03/03/22   ? ?  ?  ? ?  ? ? ? ? ? ? ? ? Plan - 01/13/22 1123   ? ? Clinical Impression Statement Pt presented with excellent motivation to participate in session. She tolerated interventions well without significant fatigue and without increased pain. Pt did report some dizziness with body roll intervention that improved with rest. She was most challenged with turning balance exercises. Pt required CGA-min a throughout. The pt will benefit from further skilled PT to improve mobility, strenth and balance to decrease fall risk.   ? Personal Factors and Comorbidities Age;Sex;Fitness;Comorbidity 3+   ? 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   ? Examination-Activity Limitations Locomotion  Level;Carry;Lift;Hygiene/Grooming;Bend;Reach Overhead;Stairs;Stand;Transfers   ? Examination-Participation Restrictions Community Activity;Yard Work;Cleaning;Shop   ? Stability/Clinical Decision Making Evolving/Modera

## 2022-01-15 ENCOUNTER — Encounter: Payer: Self-pay | Admitting: Internal Medicine

## 2022-01-15 NOTE — Assessment & Plan Note (Signed)
Sodium better with fluid restriction and spironolactone decrease 12.'5mg'$  q day.  Follow sodium.  ?

## 2022-01-15 NOTE — Assessment & Plan Note (Signed)
Elevated today.  Off spironolactone as outlined.  Instructed to restart.  Was just to be off while taking bactrim.  Continue coreg and lisinopril.   ?

## 2022-01-15 NOTE — Assessment & Plan Note (Signed)
Continue crestor 

## 2022-01-15 NOTE — Assessment & Plan Note (Signed)
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 However the radiologist opined that this could be a malignancy. Hence we will follow-up with his sonogram annually to assess the left kidney mass. ? ?

## 2022-01-15 NOTE — Assessment & Plan Note (Signed)
No urinary symptoms now.   ?

## 2022-01-15 NOTE — Assessment & Plan Note (Signed)
Off vitamin D.  Follow calcium.   ?

## 2022-01-15 NOTE — Assessment & Plan Note (Signed)
Seeing neurology.  MRI as outlined.  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  Recommended speech therapy for cognitive training.   ?

## 2022-01-15 NOTE — Assessment & Plan Note (Signed)
Persistent issue.  Evaluated by ENT.  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  Recommended vestibular rehab and evaluation by neurology.  Neurology evaluation as outlined.  MRI as outlined.  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 Describes more unsteadiness and fear of falling.  Seeing PT for core strengthening, balance and gait issues.  Has been given information for referral to [facility].   ?

## 2022-01-15 NOTE — Assessment & Plan Note (Signed)
On thyroid replacement.  Follow tsh.  

## 2022-01-15 NOTE — Assessment & Plan Note (Signed)
Low cholesterol diet and exercise.  Continue crestor.  Follow lipid panel and liver function tests.   

## 2022-01-15 NOTE — Assessment & Plan Note (Signed)
Found on ultrasound.  Follow liver function tests.   

## 2022-01-17 ENCOUNTER — Other Ambulatory Visit: Payer: Self-pay

## 2022-01-17 ENCOUNTER — Ambulatory Visit: Payer: Medicare Other

## 2022-01-17 DIAGNOSIS — R2681 Unsteadiness on feet: Secondary | ICD-10-CM | POA: Diagnosis not present

## 2022-01-17 DIAGNOSIS — R42 Dizziness and giddiness: Secondary | ICD-10-CM

## 2022-01-17 DIAGNOSIS — M6281 Muscle weakness (generalized): Secondary | ICD-10-CM

## 2022-01-17 DIAGNOSIS — R2689 Other abnormalities of gait and mobility: Secondary | ICD-10-CM

## 2022-01-17 NOTE — Therapy (Signed)
[facility] ?[place], [place], [address] ?Phone: [phone]   Fax:  [phone] ? ?Physical Therapy Treatment ? ?Patient Details  ?Name: Crystal Lynch ?MRN: [id number] ?Date of Birth: [birth year] ?Referring Provider (PT): Vladimir Crofts, MD ? ? ?Encounter Date: 01/17/2022 ? ? PT End of Session - 01/18/22 6962   ? ? Visit Number 7   ? Number of Visits 25   ? Date for PT Re-Evaluation 03/03/22   ? PT Start Time 1148   ? PT Stop Time 9528   ? PT Time Calculation (min) 41 min   ? Equipment Utilized During Treatment Gait belt   ? Activity Tolerance Patient tolerated treatment well   ? Behavior During Therapy [facility] for tasks assessed/performed   ? ?  ?  ? ?  ? ? ?Past Medical History:  ?Diagnosis Date  ? Arthritis   ? Chicken pox   ? Endometriosis   ? Esophagitis   ? s/p esophageal dilatation  ? GERD (gastroesophageal reflux disease)   ? History of ovarian cyst   ? Hypercholesterolemia   ? Hypertension   ? Hypothyroidism   ? ? ?Past Surgical History:  ?Procedure Laterality Date  ? ABDOMINAL SURGERY    ? found to have an ovarian cyst and endometriosis  ? APPENDECTOMY    ? ? ?There were no vitals filed for this visit. ? ? Subjective Assessment - 01/17/22 1151   ? ? Subjective Pt reports 3/10 L hip pain currently. Pt reports no falls. Pt reports performing her HEP.   ? Pertinent History Pt presents to PT evaluation ambulating with QC. Pt with c/o imbalance and dizziness that started >4 months ago. The pt is known to this clinic and was previously seen for vestibular therapy. Pt reports vestibular therapy did improve her symptoms some. Pt also recently seen by Speech due to memory changes. Pt seen by ENT and followed by neurologist (Dr. Manuella Ghazi). She reports recent imaging/work-up but that a cause for dizziness and imbalance has not been determined. Pt current concerns and symptoms include: difficulty with turns, bothered by bright lights, FOF, problems with  her vision (has eye appointment next week d/t cataracts, per pt). 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   ? Limitations Standing;House hold activities;Walking;Lifting;Reading   reading limited due to difficulty focusing, thinks due to vision  ? How long can you sit comfortably? not affected   ? How long can you stand comfortably? Pt reports she will lean against something due to unsteadiness when standing   ? How long can you walk comfortably? Pt ambulates with QC or holds onto her husband's ([name]) arm due to unsteadiness   ? 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 Patient Stated Goals Pt states, "I would like to be able to jump up out of my chair like I've always done without having to be cautious all the time, without feeling like I'm going to fall." Pt states, "I want my life back."   ? Currently in Pain? Yes   ? Pain Score 3    ? Pain Location Hip   ? Pain Orientation Left   ? Pain Onset More than a month  ago   pt reports HA is on and off  ? ?  ?  ? ?  ? ? ?INTERVENTIONS ? ?  ?  ?Exercise/Activity Sets/Reps/Time/ Resistance Assistance Charge type Comments-Unless otherwise stated, CGA was provided and gait belt donned in order to ensure pt safety ?The following activities were completed by a support surface ?   ?Obstacle course with navigating cones: performed with and without dual cognitive task X several minutes CGA-min a NMR  With QC, increased errors/difficulty with dual task.  ?Obstacle course with dual motor task, and progressed to dual motor and dual cog task X several rounds CGA NMR With QC, knocks over cones, initial increase  in errors, but improves with time  ? Tandem walking 6x over 10 meter track CGA - min a NMR Very challenging for pt, unsteady.   ? One foot on floor one on 6 inch step 2x60 sec each LE CGA NMR Intermittent UE support  ?Walking march for SLB progression X 5 min Cga-min a NMR Very challenging for pt, cuing for decreased speed/improved control  ?Ambulating with head turns (vertical and horizontal) 2x for each over 10 meters CGA-min a NMR Pt rates as challenging  ?  ?  ?  ?Pt educated throughout session about proper posture and technique with exercises. Improved exercise technique, movement at target joints, use of target muscles after min to mod verbal, visual, tactile cues. ? ? ? ? PT Education - 01/18/22 0918   ? ? Education Details exercise technique, body mechanics   ? Person(s) Educated Patient   ? Methods Explanation;Demonstration;Verbal cues   ? Comprehension Verbalized understanding;Returned demonstration;Need further instruction   ? ?  ?  ? ?  ? ? ? PT Short Term Goals - 12/09/21 1003   ? ?  ? PT SHORT TERM GOAL #1  ? Title Pt will be independent with HEP in order to improve strength and balance in order to decrease fall risk and improve function at home and work.   ? Baseline 2/17: instructed pt to resume her previous seated VORx1 HEP, provided activity/symptom tracker handout   ? Time 6   ? Period Weeks   ? Status New   ? Target Date 01/20/22   ? ?  ?  ? ?  ? ? ? ? PT Long Term Goals - 12/20/21 1109   ? ?  ? PT LONG TERM GOAL #1  ? Title Patient will have improved FOTO score of 5 points or greater in order to demonstrate improvements in patient's ADLs and functional performance.   ? Baseline 2/17: 60%   ? Time 12   ? Period Weeks   ? Status New   ? Target Date 03/03/22   ?  ? PT LONG TERM GOAL #2  ? Title Pt will improve ABC by at least 13% in order to demonstrate clinically significant improvement in balance confidence.   ? Baseline 2/17: to be assessed next 1-2 session (was 81% on 06/14/2021); 2/28: 40.3%    ? Time 12   ? Period Weeks   ? Status New   ? Target Date 03/03/22   ?  ? PT LONG TERM GOAL #3  ? Title Patient will demonstrate reduced falls risk as evidenced by Dynamic Gait Index (DGI) 21/24 or greater.   ? Baseline 2/17: 13/24 (was 23/24 on 06/14/2021)   ? Time 12   ? Period Weeks   ? Status New   ? Target Date 03/03/22   ?  ? PT  LONG TERM GOAL #4  ? Title Patient will have improved gross B lower extremity to at least 4+/5 in order to improve patient's balance and mobility skills.   ? Baseline 2/17: grossly 4-/5   ? Time 12   ? Period Weeks   ? Status New   ? Target Date 03/03/22   ?  ? PT LONG TERM GOAL #5  ? Title Patient will have improved 73mt gait speed to >1 m/s for improved community ambulation and gait ability.   ? Baseline 2/17: to be assessed next 1-2 sessions; 2/28: 0.83 m/s with QC   ? Time 12   ? Period Weeks   ? Status New   ? Target Date 03/03/22   ? ?  ?  ? ?  ? ? ? ? ? ? ? ? Plan - 01/18/22 0921   ? ? Clinical Impression Statement Pt highly motivated to participate in session. She tolerates interventions well without pain or increased dizziness. PT able to progress level of complexity of balance interventions by adding dual cognitive and motor tasks. Pt with initial increase in errors but then showed within-session improvement. She required CGA-min a throughout. The pt will benefit from further skilled PT to improve balance, gait and mobility to decrease fall risk.   ? Personal Factors and Comorbidities Age;Sex;Fitness;Comorbidity 3+   ? 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   ? Examination-Activity Limitations Locomotion Level;Carry;Lift;Hygiene/Grooming;Bend;Reach  Overhead;Stairs;Stand;Transfers   ? Examination-Participation Restrictions Community Activity;Yard Work;Cleaning;Shop   ? Stability/Clinical Decision Making Evolving/Moderate complexity   ? Rehab Potential Good   ? PT Frequency

## 2022-01-19 ENCOUNTER — Encounter: Payer: Medicare Other | Admitting: Speech Pathology

## 2022-01-19 ENCOUNTER — Other Ambulatory Visit: Payer: Self-pay | Admitting: Cardiovascular Disease

## 2022-01-20 ENCOUNTER — Ambulatory Visit: Payer: Medicare Other

## 2022-01-20 DIAGNOSIS — R2681 Unsteadiness on feet: Secondary | ICD-10-CM | POA: Diagnosis not present

## 2022-01-20 DIAGNOSIS — R42 Dizziness and giddiness: Secondary | ICD-10-CM

## 2022-01-20 DIAGNOSIS — M6281 Muscle weakness (generalized): Secondary | ICD-10-CM

## 2022-01-20 DIAGNOSIS — R278 Other lack of coordination: Secondary | ICD-10-CM

## 2022-01-20 DIAGNOSIS — R2689 Other abnormalities of gait and mobility: Secondary | ICD-10-CM

## 2022-01-20 NOTE — Therapy (Signed)
?[facility] ?[place], [place], [address] ?Phone: [phone]   Fax:  [phone] ? ?Physical Therapy Treatment ? ?Patient Details  ?Name: Crystal Lynch ?MRN: [id number] ?Date of Birth: 1940-12-15 ?Referring Provider (PT): Vladimir Crofts, MD ? ? ?Encounter Date: 01/20/2022 ? ? PT End of Session - 01/20/22 1127   ? ? Visit Number 8   ? Number of Visits 25   ? Date for PT Re-Evaluation 03/03/22   ? PT Start Time [phone]   ? PT Stop Time 1014   ? PT Time Calculation (min) 40 min   ? Equipment Utilized During Treatment Gait belt   ? Activity Tolerance Patient tolerated treatment well   ? Behavior During Therapy [facility] for tasks assessed/performed   ? ?  ?  ? ?  ? ? ?Past Medical History:  ?Diagnosis Date  ? Arthritis   ? Chicken pox   ? Endometriosis   ? Esophagitis   ? s/p esophageal dilatation  ? GERD (gastroesophageal reflux disease)   ? History of ovarian cyst   ? Hypercholesterolemia   ? Hypertension   ? Hypothyroidism   ? ? ?Past Surgical History:  ?Procedure Laterality Date  ? ABDOMINAL SURGERY    ? found to have an ovarian cyst and endometriosis  ? APPENDECTOMY    ? ? ?There were no vitals filed for this visit. ? ? Subjective Assessment - 01/20/22 1126   ? ? Subjective Pt reports continued L hip pain. Pt reports appointment at [facility] is in May. Pt rates dizziness as 6/10 currently but has noticed dizziness improves as day goes on.   ? Pertinent History Pt presents to PT evaluation ambulating with QC. Pt with c/o imbalance and dizziness that started >4 months ago. The pt is known to this clinic and was previously seen for vestibular therapy. Pt reports vestibular therapy did improve her symptoms some. Pt also recently seen by Speech due to memory changes. Pt seen by ENT and followed by neurologist (Dr. Manuella Ghazi). She reports recent imaging/work-up but that a cause for dizziness and imbalance has not been determined. Pt current concerns and symptoms include:  difficulty with turns, bothered by bright lights, FOF, problems with her vision (has eye appointment next week d/t cataracts, per pt). 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   ? Limitations Standing;House hold activities;Walking;Lifting;Reading   reading limited due to difficulty focusing, thinks due to vision  ? How long can you sit comfortably? not affected   ? How long can you stand comfortably? Pt reports she will lean against something due to unsteadiness when standing   ? How long can you walk comfortably? Pt ambulates with QC or holds onto her husband's ([name]) arm due to unsteadiness   ? 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 Patient Stated Goals Pt states, "I would like to be able to jump up out of my chair like I've always done without having to be cautious all the time, without feeling like I'm going to fall." Pt states, "I want my life back."   ? Currently in Pain? Yes   ? Pain Location Hip   ? Pain Orientation Left   ?  Pain Onset More than a month ago   pt reports HA is on and off  ? ?  ?  ? ?  ? ?  ?INTERVENTIONS ?  ?  ?  ?Exercise/Activity Sets/Reps/Time/ Resistance Assistance Charge type Comments-Unless otherwise stated, CGA was provided and gait belt donned in order to ensure pt safety ?The following activities were completed by a support surface ?   ?Body rolls to promote ease with turning 8 rounds CGA NMR Pt takes brief rest breaks due to dizziness. Pt rates as challenging.  ?Obstacle course with navigating cones, stepping over obstacle and ambulating over compliant surface -performed with and without dual cognitive  task X several minutes CGA-min a NMR  With QC, increased errors/difficulty with dual task. Difficulty with foot clearance throughout.  ?Stepping over orange hurdle 2x20 reps each LE CGA NMR Pt uses BUE support. Difficulty with LLE foot clearance x multiple occassions. 1 occasion of not clearing RLE.  ?Step up onto 6" step 2x20 alternating CGA NMR Pt improves foot clearance of LLE with reps.  ?Standing hip abduction 3x10 each LE  Therex Pt rates medium  ?Standing heel raises 2x20 B  Therex Pt rates easy. Cuing for technique/not to use momentum to complete reps. Pt intermittently able to correct  ?STS 3x10  Therex Pt tendency to lose balance posteriorly into chair. Cuing for "nose over toes" pt exhibits improved postural stability.  ?Seated adductor squeeze with ball 20x with 3 sec holds  Therex Pt rates easy  ?  ?  ?  ?Pt educated throughout session about proper posture and technique with exercises. Improved exercise technique, movement at target joints, use of target muscles after min to mod verbal, visual, tactile cues. ? ? ? ? PT Education - 01/20/22 1127   ? ? Education Details exercise technique   ? Person(s) Educated Patient   ? Methods Explanation;Demonstration;Tactile cues;Verbal cues   ? Comprehension Verbalized understanding;Returned demonstration;Verbal cues required;Need further instruction   ? ?  ?  ? ?  ? ? ? PT Short Term Goals - 12/09/21 1003   ? ?  ? PT SHORT TERM GOAL #1  ? Title Pt will be independent with HEP in order to improve strength and balance in order to decrease fall risk and improve function at home and work.   ? Baseline 2/17: instructed pt to resume her previous seated VORx1 HEP, provided activity/symptom tracker handout   ? Time 6   ? Period Weeks   ? Status New   ? Target Date 01/20/22   ? ?  ?  ? ?  ? ? ? ? PT Long Term Goals - 12/20/21 1109   ? ?  ? PT LONG TERM GOAL #1  ? Title Patient will have improved FOTO score of 5 points or greater in order to demonstrate improvements in  patient's ADLs and functional performance.   ? Baseline 2/17: 60%   ? Time 12   ? Period Weeks   ? Status New   ? Target Date 03/03/22   ?  ? PT LONG TERM GOAL #2  ? Title Pt will improve ABC by at least 13% in order to demonstrate clinically significant improvement in balance confidence.   ? Baseline 2/17: to be assessed next 1-2 session (was 81% on 06/14/2021); 2/28: 40.3%   ? Time 12   ? Period Weeks   ? Status New   ? Target Date 03/03/22   ?  ? PT LONG TERM GOAL #3  ? Title  Patient will demonstrate reduced falls risk as evidenced by Dynamic Gait Index (DGI) 21/24 or greater.   ? Baseline 2/17: 13/24 (was 23/24 on 06/14/2021)   ? Time 12   ? Period Weeks   ? Status New   ? Target Date 03/03/22   ?  ? PT LONG TERM GOAL #4  ? Title Patient will have improved gross B lower extremity to at least 4+/5 in order to improve patient's balance and mobility skills.   ? Baseline 2/17: grossly 4-/5   ? Time 12   ? Period Weeks   ? Status New   ? Target Date 03/03/22   ?  ? PT LONG TERM GOAL #5  ? Title Patient will have improved 73mt gait speed to >1 m/s for improved community ambulation and gait ability.   ? Baseline 2/17: to be assessed next 1-2 sessions; 2/28: 0.83 m/s with QC   ? Time 12   ? Period Weeks   ? Status New   ? Target Date 03/03/22   ? ?  ?  ? ?  ? ? ? ? ? ? ? ? Plan - 01/20/22 1128   ? ? Clinical Impression Statement Pt with excellent motivation to participate in session. She tolerates interventions well without increased hip pain. Pt able to advance obstacle course to ambulating over compliant surface with addition of obstacles to turn around and step over. Pt noted to have difficulty with LLE foot clearance. Following interventions focused on foot clearance, and pt exhibited within-session improvement. The pt will benefit from further skilled PT to improve balance, gait and mobility to decrease fall risk.   ? Personal Factors and Comorbidities Age;Sex;Fitness;Comorbidity 3+   ? Comorbidities Per chart PMH  is significant for HTN, hypercholesterolemia, hypothyroidism, osteopenia, sleeping difficulties, frequent urinary tract infections, chest pain, fall, dizziness, hyponatremia, prolongted QT interval, R acute

## 2022-01-23 ENCOUNTER — Telehealth: Payer: Self-pay | Admitting: Internal Medicine

## 2022-01-23 NOTE — Telephone Encounter (Signed)
If having persistent elevated blood pressure and wants checked prior to going to [facility], can schedule appt ?

## 2022-01-23 NOTE — Telephone Encounter (Signed)
Recently restarted spironolactone 12.'5mg'$  q day about a week ago per your last office note had discussed with her about restarting it.  ?

## 2022-01-23 NOTE — Telephone Encounter (Signed)
Pt was calling to let me know that her appt with [facility] got moved up to next week and her pantoprazole got approved. Patient says that she will be having an MRI done next week and needs something to calm her nerves. She has never had anything in the past like this but no known allergies. While on the phone, pt noted her BP has been elevated. 189/80 amd 192/78 without medication and then 154/69 after taking medication. Patient is wondering if we should have her come in and make sure BP is good because she does not want it to be elevated when she goes to [facility] ?

## 2022-01-23 NOTE — Telephone Encounter (Signed)
Patient would like to speak to Larena Glassman about some medication Dr Nicki Reaper wants her to start taking. She does not know the name of the medication. ?

## 2022-01-24 NOTE — Telephone Encounter (Signed)
Patient has been scheduled,

## 2022-01-25 ENCOUNTER — Ambulatory Visit: Payer: Medicare Other | Attending: Neurology

## 2022-01-25 DIAGNOSIS — R2681 Unsteadiness on feet: Secondary | ICD-10-CM | POA: Insufficient documentation

## 2022-01-25 DIAGNOSIS — R2689 Other abnormalities of gait and mobility: Secondary | ICD-10-CM | POA: Insufficient documentation

## 2022-01-25 DIAGNOSIS — R278 Other lack of coordination: Secondary | ICD-10-CM | POA: Diagnosis present

## 2022-01-25 DIAGNOSIS — M6281 Muscle weakness (generalized): Secondary | ICD-10-CM | POA: Insufficient documentation

## 2022-01-25 DIAGNOSIS — R42 Dizziness and giddiness: Secondary | ICD-10-CM | POA: Diagnosis present

## 2022-01-25 DIAGNOSIS — R41841 Cognitive communication deficit: Secondary | ICD-10-CM | POA: Insufficient documentation

## 2022-01-25 NOTE — Therapy (Addendum)
[facility] ?[facility] ?[place], [place], [address] ?Phone: [phone]   Fax:  [phone] ? ?Physical Therapy Treatment ? ?Patient Details  ?Name: Crystal Lynch ?MRN: [id number] ?Date of Birth: 09/18/1941 ?Referring Provider (PT): Vladimir Crofts, MD ? ? ?Encounter Date: 01/25/2022 ? ? PT End of Session - 01/25/22 0840   ? ? Visit Number 9   ? Number of Visits 25   ? Date for PT Re-Evaluation 03/03/22   ? PT Start Time [phone]   ? PT Stop Time 0929   ? PT Time Calculation (min) 43 min   ? Equipment Utilized During Treatment Gait belt   ? Activity Tolerance Patient tolerated treatment well   ? Behavior During Therapy [facility] for tasks assessed/performed   ? ?  ?  ? ?  ? ? ?Past Medical History:  ?Diagnosis Date  ? Arthritis   ? Chicken pox   ? Endometriosis   ? Esophagitis   ? s/p esophageal dilatation  ? GERD (gastroesophageal reflux disease)   ? History of ovarian cyst   ? Hypercholesterolemia   ? Hypertension   ? Hypothyroidism   ? ? ?Past Surgical History:  ?Procedure Laterality Date  ? ABDOMINAL SURGERY    ? found to have an ovarian cyst and endometriosis  ? APPENDECTOMY    ? ? ?There were no vitals filed for this visit. ? ? Subjective Assessment - 01/25/22 0838   ? ? Subjective Patient reports she is still having a lot of trouble with her balance and has an appointment at [facility] next week.   ? Pertinent History Pt presents to PT evaluation ambulating with QC. Pt with c/o imbalance and dizziness that started >4 months ago. The pt is known to this clinic and was previously seen for vestibular therapy. Pt reports vestibular therapy did improve her symptoms some. Pt also recently seen by Speech due to memory changes. Pt seen by ENT and followed by neurologist (Dr. Manuella Ghazi). She reports recent imaging/work-up but that a cause for dizziness and imbalance has not been determined. Pt current concerns and symptoms include: difficulty with turns, bothered by bright  lights, FOF, problems with her vision (has eye appointment next week d/t cataracts, per pt). 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   ? Limitations Standing;House hold activities;Walking;Lifting;Reading   reading limited due to difficulty focusing, thinks due to vision  ? How long can you sit comfortably? not affected   ? How long can you stand comfortably? Pt reports she will lean against something due to unsteadiness when standing   ? How long can you walk comfortably? Pt ambulates with QC or holds onto her husband's ([name]) arm due to unsteadiness   ? 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 Patient Stated Goals Pt states, "I would like to be able to jump up out of my chair like I've always done without having to be cautious all the time, without feeling like I'm going to fall." Pt states, "I want my life back."   ? Currently in Pain? Yes   ? Pain Score 3    ? Pain Location Hip   ? Pain Orientation Left   ?  Pain Descriptors / Indicators Aching   ? Pain Type Chronic pain   ? Pain Onset More than a month ago   pt reports HA is on and off  ? Pain Frequency Intermittent   ? ?  ?  ? ?  ? ? ?INTERVENTIONS:  ? ? ? ?  ?Exercise/Activity Sets/Reps/Time/ Resistance Assistance Charge type Comments-Unless otherwise stated, CGA was provided and gait belt donned in order to ensure pt safety ?The following activities were completed by a support surface ?   ?Seated hip march 3# BLE ?X 12 reps  Therex VC for height of march and to slow down for max control.   ?Seated LAQ 3# BLE x12 reps  Therex Patient rates as medium  ?Seated hamstring curl GTB 2 sets  of 12 reps  PT holding theraband Therex   ?Standing hip ext X 12 reps Alt LE GGA Therex   ?Standing hip abduction x15 each LE  CGA Therex Pt rates medium  ?Standing heel raises X 20 B LE  CGA Therex   ?STS X 10 reps with min BUE Support  CGA Therex Pt tendency to lose balance posteriorly into chair. Cuing for "nose over toes" pt exhibits improved postural stability.  ?Seated adductor squeeze with ball 10x with 3 sec holds   Therex Pt rates easy  ?  ?  ?  ?Pt educated throughout session about proper posture and technique with exercises. Improved exercise technique, movement at target joints, use of target muscles after min to mod verbal, visual, tactile cues. ?  ?  ?  ? ? ? ? ? ? ? ? ? ? ? ? ? ? ? ? ? ? ? ? ? ? ? ? PT Education - 01/25/22 0839   ? ? Education Details Exercise technique   ? Person(s) Educated Patient   ? Methods Explanation;Demonstration;Tactile cues;Verbal cues   ? Comprehension Verbalized understanding;Returned demonstration;Verbal cues required;Tactile cues required;Need further instruction   ? ?  ?  ? ?  ? ? ? PT Short Term Goals - 12/09/21 1003   ? ?  ? PT SHORT TERM GOAL #1  ? Title Pt will be independent with HEP in order to improve strength and balance in order to decrease fall risk and improve function at home and work.   ? Baseline 2/17: instructed pt to resume her previous seated VORx1 HEP, provided activity/symptom tracker handout   ? Time 6   ? Period Weeks   ? Status New   ? Target Date 01/20/22   ? ?  ?  ? ?  ? ? ? ? PT Long Term Goals - 12/20/21 1109   ? ?  ? PT LONG TERM GOAL #1  ? Title Patient will have improved FOTO score of 5 points or greater in order to demonstrate improvements in patient's ADLs and functional performance.   ? Baseline 2/17: 60%   ? Time 12   ? Period Weeks   ? Status New   ? Target Date 03/03/22   ?  ? PT LONG TERM GOAL #2  ? Title Pt will improve ABC by at least 13% in order to demonstrate clinically significant improvement in balance confidence.   ? Baseline  2/17: to be assessed next 1-2 session (was 81% on 06/14/2021); 2/28: 40.3%   ? Time 12   ? Period Weeks   ? Status New   ? Target Date 03/03/22   ?  ? PT LONG TERM GOAL #3  ? Title Patient will  demonstrate reduced falls risk as evidenced by Dynamic Gait Index (DGI) 21/24 or greater.   ? Baseline 2/17: 13/24 (was 23/24 on 06/14/2021)   ? Time 12   ? Period Weeks   ? Status New   ? Target Date 03/03/22   ?  ? PT LONG TERM GOAL #4  ? Title Patient will have improved gross B lower extremity to at least 4+/5 in order to improve patient's balance and mobility skills.   ? Baseline 2/17: grossly 4-/5   ? Time 12   ? Period Weeks   ? Status New   ? Target Date 03/03/22   ?  ? PT LONG TERM GOAL #5  ? Title Patient will have improved 59mt gait speed to >1 m/s for improved community ambulation and gait ability.   ? Baseline 2/17: to be assessed next 1-2 sessions; 2/28: 0.83 m/s with QC   ? Time 12   ? Period Weeks   ? Status New   ? Target Date 03/03/22   ? ?  ?  ? ?  ? ? ? ? ? ? ? ? Plan - 01/25/22 0840   ? ? Clinical Impression Statement Patient able to complete all exercises without report of increased pain and no LOB today. Exercises were targeted more toward strengthening today [facility]. She denied any lightheaded or off balance with limited standing exercises today. The pt will benefit from further skilled PT to improve balance, gait and mobility to decrease fall risk   ? Personal Factors and Comorbidities Age;Sex;Fitness;Comorbidity 3+   ? 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   ? Examination-Activity Limitations Locomotion  Level;Carry;Lift;Hygiene/Grooming;Bend;Reach Overhead;Stairs;Stand;Transfers   ? Examination-Participation Restrictions Community Activity;Yard Work;Cleaning;Shop   ? Stability/Clinical Decision Making Evolving/Moderate comp

## 2022-01-26 ENCOUNTER — Ambulatory Visit (INDEPENDENT_AMBULATORY_CARE_PROVIDER_SITE_OTHER): Payer: Medicare Other | Admitting: Internal Medicine

## 2022-01-26 ENCOUNTER — Encounter: Payer: Medicare Other | Admitting: Speech Pathology

## 2022-01-26 VITALS — BP 150/90 | HR 78 | Temp 98.0°F | Resp 16 | Ht 62.0 in | Wt 131.4 lb

## 2022-01-26 DIAGNOSIS — E039 Hypothyroidism, unspecified: Secondary | ICD-10-CM

## 2022-01-26 DIAGNOSIS — K209 Esophagitis, unspecified without bleeding: Secondary | ICD-10-CM | POA: Diagnosis not present

## 2022-01-26 DIAGNOSIS — I10 Essential (primary) hypertension: Secondary | ICD-10-CM | POA: Diagnosis not present

## 2022-01-26 DIAGNOSIS — K760 Fatty (change of) liver, not elsewhere classified: Secondary | ICD-10-CM

## 2022-01-26 DIAGNOSIS — E7800 Pure hypercholesterolemia, unspecified: Secondary | ICD-10-CM

## 2022-01-26 DIAGNOSIS — I700 Atherosclerosis of aorta: Secondary | ICD-10-CM

## 2022-01-26 DIAGNOSIS — R42 Dizziness and giddiness: Secondary | ICD-10-CM

## 2022-01-26 MED ORDER — LORAZEPAM 0.5 MG PO TABS: ORAL_TABLET | ORAL | 0 refills | Status: DC

## 2022-01-26 MED ORDER — LISINOPRIL 40 MG PO TABS: 40.0000 mg | ORAL_TABLET | Freq: Every day | ORAL | 1 refills | Status: DC

## 2022-01-26 NOTE — Progress Notes (Signed)
Patient ID: Crystal Lynch, female   DOB: 1941/08/25, 81 y.o.   MRN: [id number] ? ? ?Subjective:  ? ? Patient ID: Crystal Lynch, female    DOB: 05/23/1941, 81 y.o.   MRN: [id number] ? ?This visit occurred during the SARS-CoV-2 public health emergency.  Safety protocols were in place, including screening questions prior to the visit, additional usage of staff PPE, and extensive cleaning of exam room while observing appropriate contact time as indicated for disinfecting solutions.  ? ?Patient here for work in appt.  ? ?Chief Complaint  ?Patient presents with  ? Hypertension  ? .  ? ?HPI ?Has been having issues with unsteady gait, dizziness and elevated blood pressure.  Evaluated by neurology.  MRI reviewed and concerned raised regarding normal pressure hydrocephalus.  Is s/p vestibular rehab and ENT evaluation.  39% left caloric weakness on VNG test.  Concern raised regarding silent stroke.  Going to PT now to help with unsteady gait.  Has appt at [facility] next week - to discuss the concern of hydrocephalus.  Last visit, was off spironolactone.  Was instructed to restart 12.'5mg'$  q day.  Had problems with low sodium on higher dose.  Called in with persistent elevated blood pressure.  Currently taking coreg bid, lisinopril '20mg'$  q day and spironolactone 12.'5mg'$  q day.  No headache.  Has discussed her eye issues with ophthalmology.  No chest pain or sob reported.  No increased cough or congestion.  No abdominal pain or bowels moving.   ? ? ?Past Medical History:  ?Diagnosis Date  ? Arthritis   ? Chicken pox   ? Endometriosis   ? Esophagitis   ? s/p esophageal dilatation  ? GERD (gastroesophageal reflux disease)   ? History of ovarian cyst   ? Hypercholesterolemia   ? Hypertension   ? Hypothyroidism   ? ?Past Surgical History:  ?Procedure Laterality Date  ? ABDOMINAL SURGERY    ? found to have an ovarian cyst and endometriosis  ? APPENDECTOMY    ? ?Family History  ?Problem Relation Age of Onset  ? Lung disease Father    ? Heart attack Father   ? Heart disease Mother   ?     myocardial infarction  ? Breast cancer Mother 49  ? Heart attack Mother   ? Hypertension Mother   ? Hyperlipidemia Mother   ? Breast cancer Sister 5  ? Hypertension Sister   ? Rheum arthritis Sister   ? Breast cancer Maternal Aunt 44  ? Hypertension Brother   ? Colon cancer Neg Hx   ? ?Social History  ? ?Socioeconomic History  ? Marital status: Married  ?  Spouse name: Not on file  ? Number of children: Not on file  ? Years of education: Not on file  ? Highest education level: Not on file  ?Occupational History  ? Not on file  ?Tobacco Use  ? Smoking status: Never  ? Smokeless tobacco: Never  ?Vaping Use  ? Vaping Use: Never used  ?Substance and Sexual Activity  ? Alcohol use: No  ?  Alcohol/week: 0.0 standard drinks  ? Drug use: No  ? Sexual activity: Never  ?Other Topics Concern  ? Not on file  ?Social History Narrative  ? Not on file  ? ?Social Determinants of Health  ? ?Financial Resource Strain: Low Risk   ? Difficulty of Paying Living Expenses: Not hard at all  ?Food Insecurity: No Food Insecurity  ? Worried About Charity fundraiser in  the Last Year: Never true  ? Ran Out of Food in the Last Year: Never true  ?Transportation Needs: No Transportation Needs  ? Lack of Transportation (Medical): No  ? Lack of Transportation (Non-Medical): No  ?Physical Activity: Sufficiently Active  ? Days of Exercise per Week: 5 days  ? Minutes of Exercise per Session: 30 min  ?Stress: No Stress Concern Present  ? Feeling of Stress : Not at all  ?Social Connections: Unknown  ? Frequency of Communication with Friends and Family: More than three times a week  ? Frequency of Social Gatherings with Friends and Family: More than three times a week  ? Attends Religious Services: More than 4 times per year  ? Active Member of Clubs or Organizations: Not on file  ? Attends Archivist Meetings: Not on file  ? Marital Status: Married  ? ? ? ?Review of Systems   ?Constitutional:  Negative for appetite change and unexpected weight change.  ?HENT:  Negative for congestion and sinus pressure.   ?Respiratory:  Negative for cough, chest tightness and shortness of breath.   ?Cardiovascular:  Negative for chest pain, palpitations and leg swelling.  ?Gastrointestinal:  Negative for abdominal pain, diarrhea, nausea and vomiting.  ?Genitourinary:  Negative for difficulty urinating and dysuria.  ?Musculoskeletal:  Negative for joint swelling and myalgias.  ?Skin:  Negative for color change and rash.  ?Neurological:  Positive for dizziness and light-headedness. Negative for headaches.  ?     Unsteady gait.   ?Psychiatric/Behavioral:  Negative for agitation and dysphoric mood.   ? ?   ?Objective:  ?  ? ?BP (!) 150/90   Pulse 78   Temp 98 ?F (36.7 ?C)   Resp 16   Ht '5\' 2"'$  (1.575 m)   Wt 131 lb 6.4 oz (59.6 kg)   LMP 10/21/1984   SpO2 99%   BMI 24.03 kg/m?  ?Wt Readings from Last 3 Encounters:  ?01/26/22 131 lb 6.4 oz (59.6 kg)  ?01/10/22 132 lb 6.4 oz (60.1 kg)  ?11/14/21 127 lb 12.8 oz (58 kg)  ? ? ?Physical Exam ?Vitals reviewed.  ?Constitutional:   ?   General: She is not in acute distress. ?   Appearance: Normal appearance.  ?HENT:  ?   Head: Normocephalic and atraumatic.  ?   Right Ear: External ear normal.  ?   Left Ear: External ear normal.  ?Eyes:  ?   General: No scleral icterus.    ?   Right eye: No discharge.     ?   Left eye: No discharge.  ?   Conjunctiva/sclera: Conjunctivae normal.  ?Neck:  ?   Thyroid: No thyromegaly.  ?Cardiovascular:  ?   Rate and Rhythm: Normal rate and regular rhythm.  ?Pulmonary:  ?   Effort: No respiratory distress.  ?   Breath sounds: Normal breath sounds. No wheezing.  ?Abdominal:  ?   General: Bowel sounds are normal.  ?   Palpations: Abdomen is soft.  ?   Tenderness: There is no abdominal tenderness.  ?Musculoskeletal:     ?   General: No swelling or tenderness.  ?   Cervical back: Neck supple. No tenderness.  ?Lymphadenopathy:  ?    Cervical: No cervical adenopathy.  ?Skin: ?   Findings: No erythema or rash.  ?Neurological:  ?   Mental Status: She is alert.  ?   Comments: Unsteady gait - walking with quad cane  ?Psychiatric:     ?   Mood  and Affect: Mood normal.     ?   Behavior: Behavior normal.  ? ? ? ?Outpatient Encounter Medications as of 01/26/2022  ?Medication Sig  ? lisinopril (ZESTRIL) 40 MG tablet Take 1 tablet (40 mg total) by mouth daily.  ? LORazepam (ATIVAN) 0.5 MG tablet Take 1/2 tablet prior to procedure.  May repeat x 1 if needed.  ? aspirin 81 MG EC tablet Take 1 tablet (81 mg total) by mouth daily. Swallow whole.  ? Biotin 5 MG CAPS Take 5,000 mcg by mouth daily.  ? carvedilol (COREG) 6.25 MG tablet TAKE 1 TABLET TWICE A DAY  ? cholecalciferol (VITAMIN D) 25 MCG (1000 UNIT) tablet Take 1,000 Units by mouth daily.  ? dorzolamide-timolol (COSOPT) 22.3-6.8 MG/ML ophthalmic solution Place 1 drop into both eyes 2 (two) times daily.  ? levothyroxine (SYNTHROID) 75 MCG tablet TAKE 1 TABLET BY MOUTH EVERY DAY  ? Multiple Vitamins-Minerals (ZINC PO) Take 1 tablet by mouth daily.  ? pantoprazole (PROTONIX) 40 MG tablet TAKE 1 TABLET BY MOUTH EVERY DAY (WAITING ON PA)  ? rosuvastatin (CRESTOR) 10 MG tablet TAKE 1 TABLET BY MOUTH EVERY DAY  ? [DISCONTINUED] lisinopril (ZESTRIL) 20 MG tablet Take 1 tablet (20 mg total) by mouth daily.  ? ?No facility-administered encounter medications on file as of 01/26/2022.  ?  ? ?Lab Results  ?Component Value Date  ? WBC 7.9 03/02/2021  ? HGB 14.4 03/02/2021  ? HCT 42.4 03/02/2021  ? PLT 297.0 03/02/2021  ? GLUCOSE 128 (H) 01/26/2022  ? CHOL 143 11/14/2021  ? TRIG 154.0 (H) 11/14/2021  ? HDL 49.10 11/14/2021  ? LDLDIRECT 88.0 03/22/2021  ? [place] 63 11/14/2021  ? ALT 20 11/14/2021  ? AST 16 11/14/2021  ? NA 138 01/26/2022  ? K 4.0 01/26/2022  ? CL 104 01/26/2022  ? CREATININE 0.80 01/26/2022  ? BUN 14 01/26/2022  ? CO2 26 01/26/2022  ? TSH 2.06 11/14/2021  ? INR 1.0 08/12/2015  ? ? ?US Abdomen  Complete ? ?Result Date: 11/30/2021 ?CLINICAL DATA:  Hyperbilirubinemia. EXAM: ABDOMEN ULTRASOUND COMPLETE COMPARISON:  Ultrasound 11/26/2019.  CT 05/08/2018. FINDINGS: Gallbladder: No gallstones or wall thickening visualized. No sonogra

## 2022-01-26 NOTE — Patient Instructions (Signed)
Increase lisinopril to 40mg per day.   

## 2022-01-27 ENCOUNTER — Ambulatory Visit: Payer: Medicare Other | Admitting: Physical Therapy

## 2022-01-27 ENCOUNTER — Encounter: Payer: Self-pay | Admitting: Internal Medicine

## 2022-01-27 DIAGNOSIS — R2689 Other abnormalities of gait and mobility: Secondary | ICD-10-CM

## 2022-01-27 DIAGNOSIS — R42 Dizziness and giddiness: Secondary | ICD-10-CM

## 2022-01-27 DIAGNOSIS — M6281 Muscle weakness (generalized): Secondary | ICD-10-CM

## 2022-01-27 DIAGNOSIS — R2681 Unsteadiness on feet: Secondary | ICD-10-CM

## 2022-01-27 LAB — BASIC METABOLIC PANEL WITH GFR
BUN: 14 mg/dL (ref 7–25)
CO2: 26 mmol/L (ref 20–32)
Calcium: 10.1 mg/dL (ref 8.6–10.4)
Chloride: 104 mmol/L (ref 98–110)
Creat: 0.8 mg/dL (ref 0.60–0.95)
Glucose, Bld: 128 mg/dL — ABNORMAL HIGH (ref 65–99)
Potassium: 4 mmol/L (ref 3.5–5.3)
Sodium: 138 mmol/L (ref 135–146)
eGFR: 74 mL/min/{1.73_m2} (ref >=60)

## 2022-01-27 NOTE — Assessment & Plan Note (Signed)
Continue crestor 

## 2022-01-27 NOTE — Assessment & Plan Note (Signed)
Continue protonix  

## 2022-01-27 NOTE — Assessment & Plan Note (Signed)
On thyroid replacement.  Follow tsh.  

## 2022-01-27 NOTE — Assessment & Plan Note (Signed)
Low cholesterol diet and exercise.  Continue crestor.  Follow lipid panel and liver function tests.   

## 2022-01-27 NOTE — Assessment & Plan Note (Signed)
Blood pressure remaining elevated.  Continue coreg and aldactone 12.'5mg'$  q day.  Will hold on increasing aldactone given previous low sodium.  Will increase lisinopril to '40mg'$  q day.  Follow pressures.  Check metabolic panel.   ?

## 2022-01-27 NOTE — Assessment & Plan Note (Signed)
Evaluated by ENT.  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  Recommended vestibular rehab and evaluation by neurology.  Neurology evaluation as outlined.  MRI as outlined.  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 Describes more unsteadiness and fear of falling.  Seeing PT for core strengthening, balance and gait issues.  Has appt at [facility] next week.  Continue using quad cane.  Treat blood pressure as outlined.  Follow.  ?

## 2022-01-27 NOTE — Therapy (Signed)
[facility] ?[place], [place], [address] ?Phone: [phone]   Fax:  [phone] ? ?Physical Therapy Treatment/ Physical Therapy Progress Note ? ? ?Dates of reporting period  12/09/21   to   01/27/22 ? ? ?Patient Details  ?Name: Crystal Lynch ?MRN: [id number] ?Date of Birth: 1940-11-30 ?Referring Provider (PT): Vladimir Crofts, MD ? ? ?Encounter Date: 01/27/2022 ? ? PT End of Session - 01/27/22 6222   ? ? Visit Number 10   ? Number of Visits 25   ? Date for PT Re-Evaluation 03/03/22   ? PT Start Time 0930   ? PT Stop Time 1015   ? PT Time Calculation (min) 45 min   ? Equipment Utilized During Treatment Gait belt   ? Activity Tolerance Patient tolerated treatment well   ? Behavior During Therapy [facility] for tasks assessed/performed   ? ?  ?  ? ?  ? ? ?Past Medical History:  ?Diagnosis Date  ? Arthritis   ? Chicken pox   ? Endometriosis   ? Esophagitis   ? s/p esophageal dilatation  ? GERD (gastroesophageal reflux disease)   ? History of ovarian cyst   ? Hypercholesterolemia   ? Hypertension   ? Hypothyroidism   ? ? ?Past Surgical History:  ?Procedure Laterality Date  ? ABDOMINAL SURGERY    ? found to have an ovarian cyst and endometriosis  ? APPENDECTOMY    ? ? ?There were no vitals filed for this visit. ? ? Subjective Assessment - 01/27/22 0936   ? ? Subjective Pt reports increased dizziness this date. Pt reports the bursitis in her hip feels better today. Pt reports no falls or LOB since last visit.   ? Pertinent History Pt presents to PT evaluation ambulating with QC. Pt with c/o imbalance and dizziness that started >4 months ago. The pt is known to this clinic and was previously seen for vestibular therapy. Pt reports vestibular therapy did improve her symptoms some. Pt also recently seen by Speech due to memory changes. Pt seen by ENT and followed by neurologist (Dr. Manuella Ghazi). She reports recent imaging/work-up but that a cause for dizziness and  imbalance has not been determined. Pt current concerns and symptoms include: difficulty with turns, bothered by bright lights, FOF, problems with her vision (has eye appointment next week d/t cataracts, per pt). 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   ? Limitations Standing;House hold activities;Walking;Lifting;Reading   reading limited due to difficulty focusing, thinks due to vision  ? How long can you sit comfortably? not affected   ? How long can you stand comfortably? Pt reports she will lean against something due to unsteadiness when standing   ? How long can you walk comfortably? Pt ambulates with QC or holds onto her husband's ([name]) arm due to unsteadiness   ? 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 Patient Stated Goals Pt states, "I would like to be able to jump up out of my chair like I've always done without having to be cautious all the time, without feeling like I'm going to fall." Pt states, "I want my life back."   ? Currently in Pain? No/denies   ?  Pain Score 0-No pain   ? Pain Onset More than a month ago   pt reports HA is on and off  ? ?  ?  ? ?  ? ? ? ? ? [facility] PT Assessment - 01/27/22 0001   ? ?  ? Dynamic Gait Index  ? Level Surface Mild Impairment   ? Change in Gait Speed Moderate Impairment   1 stumble noted with slow speed  ? Gait with Horizontal Head Turns Mild Impairment   ? Gait with Vertical Head Turns Mild Impairment   ? Gait and Pivot Turn Mild Impairment   ? Step Over Obstacle Moderate Impairment   ? Step Around Obstacles Mild Impairment   ? Steps Moderate Impairment   ? Total Score 13   ? ?  ?  ? ?   ?Physical therapy treatment session today consisted of completing assessment of goals and administration of testing as demonstrated in flow sheet and goals section of this exam. Addition treatments may be found below.  ? ? ?  Strength testing lower extremities.  4 of 5 hip flexion strength bilaterally, 4+ out of 5 knee extension and knee flexion strength, 4+ out of 5 dorsi flexion and plantarflexion strength ?-Improvement since initial evaluation.  Still limited strength that may be influencing her balance and ability to perform sit to stand activities that can be improved from further physical therapy interventions. ? ? ? ? ? ? ? ? ? ? ? ? ? ? ? ? ? ? ? ? ? ? ? PT Education - 01/27/22 0938   ? ? Education Details Progress with therapy at this time.   ? Person(s) Educated Patient   ? Methods Explanation   ? Comprehension Verbalized understanding   ? ?  ?  ? ?  ? ? ? PT Short Term Goals - 01/27/22 0938   ? ?  ? PT SHORT TERM GOAL #1  ? Title Pt will be independent with HEP in order to improve strength and balance in order to decrease fall risk and improve function at home and work.   ? Baseline 2/17: instructed pt to resume her previous seated VORx1 HEP, provided activity/symptom tracker handout 4/7: pt reports completing HEP regularly.   ? Time 6   ? Period Weeks   ? Status Achieved   ? Target Date 01/20/22   ? ?  ?  ? ?  ? ? ? ? PT Long Term Goals - 01/27/22 1000   ? ?  ? PT LONG TERM GOAL #1  ? Title Patient will have improved FOTO score of 5 points or greater in order to demonstrate improvements in patient's ADLs and functional performance.   ? Baseline 2/17: 60% 4/7: 53%   ? Time 12   ? Period Weeks   ? Status Not Met   ? Target Date 03/03/22   ?  ? PT LONG TERM GOAL #2  ? Title Pt will improve ABC by at least 13% in order to demonstrate clinically significant improvement in balance confidence.   ? Baseline 2/17: to be assessed next 1-2 session (was 81% on 06/14/2021); 2/28: 40.3%, 4/7: 41.9%   ? Time 12   ?  Period Weeks   ? Status Not Met   ? Target Date 03/03/22   ?  ? PT LONG TERM GOAL #3  ? Title Patient will demonstrate reduced falls risk as evidenced by Dynamic Gait Index (DGI) 21/24 or greater.   ? Baseline 2/17: 13/24 (was 23/24  on 06/14/2021) 4/7: 13/24   ? Time 12   ? Period Weeks   ? Status Partially Met   ? Target Date 03/03/22   ?  ? PT LONG TERM GOAL #4  ? Title Patient will have improved gross B lower extremity to at least 4+/5 in order to improve patient's balance and mobility skills.   ? Baseline 2/17: grossly 4-/5 4/7: 4-/5 for hip flexion, 4   ? Time 12   ? Period Weeks   ? Status Not Met   ? Target Date 03/03/22   ?  ? PT LONG TERM GOAL #5  ? Title Patient will have improved 6mt gait speed to >1 m/s for improved community ambulation and gait ability.   ? Baseline 2/17: to be assessed next 1-2 sessions; 2/28: 0.83 m/s with QC .661m with QC   ? Time 12   ? Period Weeks   ? Status Not Met   ? Target Date 03/03/22   ? ?  ?  ? ?  ? ? ? ? ? ? ? ? Plan - 01/27/22 1107   ? ? Clinical Impression Statement Patient is pleasant and presents with excellent motivation for completion of physical therapy program.  Patient presents to physical therapy for a progress note this date.  Although patient has made gains in the strength in her lower extremities bilaterally patient still presents with significant balance deficits that have not improved significantly since initial evaluation per her results on dynamic gait index, activity specific balance scale, and her 10 m walk tests.  [facility] able to assess and improve her neurological deficits and impairments.  Patient report [facility] to physical [facility].  May be a good idea to focus on strengthening interventions until information regarding her treatment plan for her neurological deficits as established [facility].  Patient will continue to benefit from skilled physical therapy intervention in order to improve her lower extremity strength, and improve her balance as well as her fall risk. Patient's conditi

## 2022-01-27 NOTE — Assessment & Plan Note (Signed)
Found on ultrasound.  Follow liver function tests.   

## 2022-01-30 ENCOUNTER — Ambulatory Visit: Payer: Medicare Other

## 2022-01-31 ENCOUNTER — Ambulatory Visit (INDEPENDENT_AMBULATORY_CARE_PROVIDER_SITE_OTHER): Payer: Medicare Other

## 2022-01-31 DIAGNOSIS — E538 Deficiency of other specified B group vitamins: Secondary | ICD-10-CM

## 2022-01-31 MED ORDER — CYANOCOBALAMIN 1000 MCG/ML IJ SOLN
1000.0000 ug | Freq: Once | INTRAMUSCULAR | Status: AC
  Administered 2022-01-31: 1000 ug via INTRAMUSCULAR

## 2022-01-31 NOTE — Progress Notes (Signed)
Patient presented for B 12 injection to right deltoid, patient voiced no concerns nor showed any signs of distress during injection. 

## 2022-02-01 ENCOUNTER — Ambulatory Visit: Payer: Medicare Other

## 2022-02-02 ENCOUNTER — Encounter: Payer: Medicare Other | Admitting: Speech Pathology

## 2022-02-07 ENCOUNTER — Ambulatory Visit: Payer: Medicare Other | Admitting: Physical Therapy

## 2022-02-09 ENCOUNTER — Ambulatory Visit: Payer: Medicare Other | Admitting: Physical Therapy

## 2022-02-09 ENCOUNTER — Encounter: Payer: Medicare Other | Admitting: Speech Pathology

## 2022-02-09 DIAGNOSIS — M6281 Muscle weakness (generalized): Secondary | ICD-10-CM

## 2022-02-09 DIAGNOSIS — R2681 Unsteadiness on feet: Secondary | ICD-10-CM

## 2022-02-09 DIAGNOSIS — R2689 Other abnormalities of gait and mobility: Secondary | ICD-10-CM

## 2022-02-09 NOTE — Therapy (Signed)
?[facility] ?[place], [place], [address] ?Phone: [phone]   Fax:  [phone] ? ?Physical Therapy Treatment ? ?Patient Details  ?Name: Crystal Lynch ?MRN: [id number] ?Date of Birth: 10/10/1941 ?Referring Provider (PT): Vladimir Crofts, MD ? ? ?Encounter Date: 02/09/2022 ? ? PT End of Session - 02/09/22 1021   ? ? Visit Number 11   ? Number of Visits 25   ? Date for PT Re-Evaluation 03/03/22   ? PT Start Time 1017   ? PT Stop Time 1058   ? PT Time Calculation (min) 41 min   ? Equipment Utilized During Treatment Gait belt   ? Activity Tolerance Patient tolerated treatment well   ? Behavior During Therapy [facility] for tasks assessed/performed   ? ?  ?  ? ?  ? ? ?Past Medical History:  ?Diagnosis Date  ? Arthritis   ? Chicken pox   ? Endometriosis   ? Esophagitis   ? s/p esophageal dilatation  ? GERD (gastroesophageal reflux disease)   ? History of ovarian cyst   ? Hypercholesterolemia   ? Hypertension   ? Hypothyroidism   ? ? ?Past Surgical History:  ?Procedure Laterality Date  ? ABDOMINAL SURGERY    ? found to have an ovarian cyst and endometriosis  ? APPENDECTOMY    ? ? ?There were no vitals filed for this visit. ? ? Subjective Assessment - 02/09/22 1019   ? ? Subjective Pt reports increased dizziness this date. Pt reports her trip to [facility] was not as productive as she had hoped as they were not able to get enough samples in order to get adequate testing completed. Pt will be going to [facility] for further testing in the future. Pt reports more frequent headaches since her visit to [facility].   ? Pertinent History Pt presents to PT evaluation ambulating with QC. Pt with c/o imbalance and dizziness that started >4 months ago. The pt is known to this clinic and was previously seen for vestibular therapy. Pt reports vestibular therapy did improve her symptoms some. Pt also recently seen by Speech due to memory changes. Pt seen by ENT and  followed by neurologist (Dr. Manuella Ghazi). She reports recent imaging/work-up but that a cause for dizziness and imbalance has not been determined. Pt current concerns and symptoms include: difficulty with turns, bothered by bright lights, FOF, problems with her vision (has eye appointment next week d/t cataracts, per pt). 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   ? Limitations Standing;House hold activities;Walking;Lifting;Reading   reading limited due to difficulty focusing, thinks due to vision  ? How long can you sit comfortably? not affected   ? How long can you stand comfortably? Pt reports she will lean against something due to unsteadiness when standing   ? How long can you walk comfortably? Pt ambulates with QC or holds onto her husband's ([name]) arm due to unsteadiness   ? 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 Patient Stated Goals Pt states, "I would like to be able to jump up out of my chair like I've always done without having to be cautious all the time, without feeling like I'm  going to fall." Pt states, "I want my life back."   ? Pain Onset More than a month ago   pt reports HA is on and off  ? ?  ?  ? ?  ? ? ?Exercise/Activity Sets/Reps/Time/ Resistance Assistance Charge type Comments-Unless otherwise stated, CGA was provided and gait belt donned in order to ensure pt safety ?The following activities were completed by a support surface ?   ?Seated hip march 3# BLE ?X 12 reps  Therex VC for height of march and to slow down for max control.  ?-decreased ROM on the L compared to the right side   ?Seated LAQ 3# BLE x12 reps  Therex  Patient rates as medium  ?Seated hamstring curl GTB 2 sets of 12 reps  PT holding theraband Therex Increased difficulty in the L LE with full ROM  ?Standing hip ext X 12 reps Alt LE with 3# AW GGA Therex   ?Standing hip abduction 2*10 each LE with 3# AW   CGA Therex Pt rates medium, fatigue noted at end   ?Standing heel raises ? ?Step up to 6 in step  X 20 B LE ? ?X 10 ea LE   CGA, B HHA  Therex Good endurance with steps this session, no pain on the LLE   ?STS X 10 reps with min BUE Support  CGA Therex Pt tendency to lose balance posteriorly into chair, improvement with cues and practice .  ?Seated adductor squeeze with ball 20x with 5 sec holds   Therex   ?  ?  ?  ?Pt educated throughout session about proper posture and technique with exercises. Improved exercise technique, movement at target joints, use of target muscles after min to mod verbal, visual, tactile cues. ? ? ? ? ? ? ? ? ? ? ? ? ? ? ? ? ? ? ? ? ? ? ? ? ? ? PT Education - 02/09/22 1021   ? ? Education Details Progress with therapy at this time.   ? Person(s) Educated Patient   ? Methods Explanation   ? Comprehension Verbalized understanding   ? ?  ?  ? ?  ? ? ? PT Short Term Goals - 01/27/22 0938   ? ?  ? PT SHORT TERM GOAL #1  ? Title Pt will be independent with HEP in order to improve strength and balance in order to decrease fall risk and improve function at home and work.   ? Baseline 2/17: instructed pt to resume her previous seated VORx1 HEP, provided activity/symptom tracker handout 4/7: pt reports completing HEP regularly.   ? Time 6   ? Period Weeks   ? Status Achieved   ? Target Date 01/20/22   ? ?  ?  ? ?  ? ? ? ? PT Long Term Goals - 01/27/22 1000   ? ?  ? PT LONG TERM GOAL #1  ? Title Patient will have improved FOTO score of 5 points or greater in order to demonstrate improvements in patient's ADLs and functional performance.   ? Baseline 2/17: 60% 4/7: 53%   ? Time 12   ? Period Weeks   ? Status Not Met   ? Target Date 03/03/22   ?  ? PT  LONG TERM GOAL #2  ? Title Pt will improve ABC by at least 13% in order to demonstrate clinically significant improvement in balance confidence.   ? Baseline 2/17: to be assessed next 1-2 session (was 81% on 06/14/2021);  2/28: 40.3%, 4/7: 41.9%   ? Time 12   ? Period Weeks   ? Status Not Met   ? Target Date 03/03/22   ?  ? PT LONG TERM GOAL #3  ? Title Patient will demonstrate reduced falls risk as evidenced by Dynamic Gait Index (DGI) 21/24 or greater.   ? Baseline 2/17: 13/24 (was 23/24 on 06/14/2021) 4/7: 13/24   ? Time 12   ? Period Weeks   ? Status Partially Met   ? Target Date 03/03/22   ?  ? PT LONG TERM GOAL #4  ? Title Patient will have improved gross B lower extremity to at least 4+/5 in order to improve patient's balance and mobility skills.   ? Baseline 2/17: grossly 4-/5 4/7: 4-/5 for hip flexion, 4   ? Time 12   ? Period Weeks   ? Status Not Met   ? Target Date 03/03/22   ?  ? PT LONG TERM GOAL #5  ? Title Patient will have improved 9mt gait speed to >1 m/s for improved community ambulation and gait ability.   ? Baseline 2/17: to be assessed next 1-2 sessions; 2/28: 0.83 m/s with QC .648m with QC   ? Time 12   ? Period Weeks   ? Status Not Met   ? Target Date 03/03/22   ? ?  ?  ? ?  ? ? ? ? ? ? ? ? Plan - 02/09/22 1021   ? ? Clinical Impression Statement Pt presents to PT with some dizziness this session but with good motivation. continued with general strengthening this session to conitnue to improve pt strength in her lower extremities for stability. Pt did have some fatigue wiht standing activities this session and required rest breakes as a result.   ? Personal Factors and Comorbidities Age;Sex;Fitness;Comorbidity 3+   ? 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   ? Examination-Activity Limitations Locomotion Level;Carry;Lift;Hygiene/Grooming;Bend;Reach Overhead;Stairs;Stand;Transfers   ? Examinati

## 2022-02-13 ENCOUNTER — Telehealth: Payer: Self-pay | Admitting: Internal Medicine

## 2022-02-13 NOTE — Telephone Encounter (Signed)
Patient saw Dr Nicki Reaper for stress and could not take the medication that was prescribed. She is requesting a milder stress medication. ?

## 2022-02-14 ENCOUNTER — Ambulatory Visit: Payer: Medicare Other

## 2022-02-14 DIAGNOSIS — M6281 Muscle weakness (generalized): Secondary | ICD-10-CM

## 2022-02-14 DIAGNOSIS — R278 Other lack of coordination: Secondary | ICD-10-CM

## 2022-02-14 DIAGNOSIS — R42 Dizziness and giddiness: Secondary | ICD-10-CM

## 2022-02-14 DIAGNOSIS — R2689 Other abnormalities of gait and mobility: Secondary | ICD-10-CM | POA: Diagnosis not present

## 2022-02-14 DIAGNOSIS — R41841 Cognitive communication deficit: Secondary | ICD-10-CM

## 2022-02-14 DIAGNOSIS — R2681 Unsteadiness on feet: Secondary | ICD-10-CM

## 2022-02-14 NOTE — Therapy (Signed)
[facility] ?[place], [place], [address] ?Phone: [phone]   Fax:  [phone] ? ?Physical Therapy Treatment ? ?Patient Details  ?Name: Crystal Lynch ?MRN: [id number] ?Date of Birth: 05/31/1941 ?Referring Provider (PT): Vladimir Crofts, MD ? ? ?Encounter Date: 02/14/2022 ? ? PT End of Session - 02/14/22 1009   ? ? Visit Number 12   ? Number of Visits 25   ? Date for PT Re-Evaluation 03/03/22   ? PT Start Time 1009   ? PT Stop Time 1054   ? PT Time Calculation (min) 45 min   ? Equipment Utilized During Treatment Gait belt   ? Activity Tolerance Patient tolerated treatment well   ? Behavior During Therapy [facility] for tasks assessed/performed   ? ?  ?  ? ?  ? ? ?Past Medical History:  ?Diagnosis Date  ? Arthritis   ? Chicken pox   ? Endometriosis   ? Esophagitis   ? s/p esophageal dilatation  ? GERD (gastroesophageal reflux disease)   ? History of ovarian cyst   ? Hypercholesterolemia   ? Hypertension   ? Hypothyroidism   ? ? ?Past Surgical History:  ?Procedure Laterality Date  ? ABDOMINAL SURGERY    ? found to have an ovarian cyst and endometriosis  ? APPENDECTOMY    ? ? ?There were no vitals filed for this visit. ? ? Subjective Assessment - 02/14/22 1008   ? ? Subjective Patient reports she has a televisit with Belva Bertin next week and states would like to continue working on her strength as she feels it is helping.   ? Pertinent History Pt presents to PT evaluation ambulating with QC. Pt with c/o imbalance and dizziness that started >4 months ago. The pt is known to this clinic and was previously seen for vestibular therapy. Pt reports vestibular therapy did improve her symptoms some. Pt also recently seen by Speech due to memory changes. Pt seen by ENT and followed by neurologist (Dr. Manuella Ghazi). She reports recent imaging/work-up but that a cause for dizziness and imbalance has not been determined. Pt current concerns and symptoms include:  difficulty with turns, bothered by bright lights, FOF, problems with her vision (has eye appointment next week d/t cataracts, per pt). 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   ? Limitations Standing;House hold activities;Walking;Lifting;Reading   reading limited due to difficulty focusing, thinks due to vision  ? How long can you sit comfortably? not affected   ? How long can you stand comfortably? Pt reports she will lean against something due to unsteadiness when standing   ? How long can you walk comfortably? Pt ambulates with QC or holds onto her husband's ([name]) arm due to unsteadiness   ? 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 Patient Stated Goals Pt states, "I would like to be able to jump up out of my chair like I've always done without having to be cautious all the time, without feeling like I'm going to fall." Pt states, "I want my life back."   ? Currently in Pain? Yes   ? Pain Score 4    ? Pain Location Hip   ? Pain  Orientation Left   ? Pain Descriptors / Indicators Aching;Sore   ? Pain Type Chronic pain   ? Pain Onset More than a month ago   pt reports HA is on and off  ? Pain Frequency Intermittent   ? Aggravating Factors  Prolonged standing or certain ways I move my leg   ? Pain Relieving Factors Tylenol   ? Effect of Pain on Daily Activities DIfficulty walking.   ? ?  ?  ? ?  ? ?INTERVENTIONS:  ? ?Therapeutic Exercises:  ? ?Seated Hip march- 3lb AW - alt LE x 12 reps x 2 sets ? ?Seated hip abd with RTB (2 sets of 15 reps)  ? ?Seated hip flex/abd up and over 3lb dumbell (using 3lb AW) x 12 reps ? ?Seated knee ext with 3lb  AW alt LE x 15 reps x 2 sets ? ?Seated ham curl with RTB x 15 reps x 2 sets ? ?Seated calf raises 3lb AW - 12 reps- Patient reported as easy so transitioned to leg press machine for calf raises- 40lb - BLE 2sets of 12 reps.  ? ? ?Seated Hip adduction squeeze with ball- 15 reps with 5 sec hold.  ? ? ?Sit to stand (x 10 with BUE and several more- 5+ without UE support and moderate VC and visual demo for optimal technique.  ? ?Education provided throughout session via VC/TC and demonstration to facilitate movement at target joints and correct muscle activation for all testing and exercises performed.  ? ? ? ?. ? ? ? ? ? ? ? ? ? ? ? ? ? ? ? ? PT Education - 02/14/22 1008   ? ? Education Details Exercise technique   ? Person(s) Educated Patient   ? Methods Explanation;Demonstration;Tactile cues;Verbal cues   ? Comprehension Verbalized understanding;Returned demonstration;Verbal cues required;Tactile cues required;Need further instruction   ? ?  ?  ? ?  ? ? ? PT Short Term Goals - 01/27/22 0938   ? ?  ? PT SHORT TERM GOAL #1  ? Title Pt will be independent with HEP in order to improve strength and balance in order to decrease fall risk and improve function at home and work.   ? Baseline 2/17: instructed pt to resume her previous seated VORx1 HEP, provided activity/symptom tracker handout 4/7: pt reports completing HEP regularly.   ? Time 6   ? Period Weeks   ? Status Achieved   ? Target Date 01/20/22   ? ?  ?  ? ?  ? ? ? ? PT Long Term Goals - 01/27/22 1000   ? ?  ? PT LONG TERM GOAL #1  ? Title Patient will have improved FOTO score of 5 points or greater in order to demonstrate improvements in patient's ADLs and functional performance.   ? Baseline 2/17: 60% 4/7: 53%   ? Time 12   ? Period Weeks   ? Status Not Met   ? Target Date 03/03/22   ?  ? PT LONG TERM GOAL #2  ? Title Pt will improve ABC by at least 13% in order to demonstrate clinically significant improvement in balance confidence.   ? Baseline 2/17: to be  assessed next 1-2 session (was 81% on 06/14/2021); 2/28: 40.3%, 4/7: 41.9%   ? Time 12   ? Period Weeks   ? Status Not Met   ? Target Date 03/03/22   ?  ? PT LONG TERM GOAL #3  ? Title Patient will demonstrate reduced falls  risk as evidenced by Dynamic Gait Index (DGI) 21/24 or greater.   ? Baseline 2/17: 13/24 (was 23/24 on 06/14/2021) 4/7: 13/24   ? Time 12   ? Period Weeks   ? Status Partially Met   ? Target Date 03/03/22   ?  ? PT LONG TERM GOAL #4  ? Title Patient will have improved gross B lower extremity to at least 4+/5 in order to improve patient's balance and mobility skills.   ? Baseline 2/17: grossly 4-/5 4/7: 4-/5 for hip flexion, 4   ? Time 12   ? Period Weeks   ? Status Not Met   ? Target Date 03/03/22   ?  ? PT LONG TERM GOAL #5  ? Title Patient will have improved 33mt gait speed to >1 m/s for improved community ambulation and gait ability.   ? Baseline 2/17: to be assessed next 1-2 sessions; 2/28: 0.83 m/s with QC .637m with QC   ? Time 12   ? Period Weeks   ? Status Not Met   ? Target Date 03/03/22   ? ?  ?  ? ?  ? ? ? ? ? ? ? ? Plan - 02/14/22 1009   ? ? Clinical Impression Statement Patient performed well with all therex today- able to complete most exercises x 2 sets without report of dizziness or fatigue. Treatment has been limited to mostly strengthening while patient is actively being evaluated for dizziness symptoms and spinal issues including stenosis. She continues to perform well and even surprised herself as she was able to sit to stand without use of UE support at end of session. Pt will continue to beenfit from skilled PT services in order to improve her strength, balance, mobility and QOL   ? Personal Factors and Comorbidities Age;Sex;Fitness;Comorbidity 3+   ? 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   ? Examination-Activity Limitations Locomotion Level;Carry;Lift;Hygiene/Grooming;Bend;Reach Ove

## 2022-02-15 NOTE — Telephone Encounter (Signed)
Yes. Would like to schedule appt to discuss treatment options.   ?

## 2022-02-15 NOTE — Telephone Encounter (Signed)
Pt has an upcoming appt on 03/03/22. Added note to discuss at that time. ?

## 2022-02-16 ENCOUNTER — Encounter: Payer: Medicare Other | Admitting: Speech Pathology

## 2022-02-16 ENCOUNTER — Ambulatory Visit: Payer: Medicare Other

## 2022-02-19 ENCOUNTER — Other Ambulatory Visit: Payer: Self-pay | Admitting: Internal Medicine

## 2022-02-21 ENCOUNTER — Ambulatory Visit: Payer: Medicare Other

## 2022-02-23 ENCOUNTER — Ambulatory Visit: Payer: Medicare Other | Attending: Neurology

## 2022-02-23 ENCOUNTER — Other Ambulatory Visit: Payer: Self-pay | Admitting: Internal Medicine

## 2022-02-23 DIAGNOSIS — R2689 Other abnormalities of gait and mobility: Secondary | ICD-10-CM

## 2022-02-23 DIAGNOSIS — R42 Dizziness and giddiness: Secondary | ICD-10-CM | POA: Diagnosis present

## 2022-02-23 DIAGNOSIS — M6281 Muscle weakness (generalized): Secondary | ICD-10-CM | POA: Diagnosis present

## 2022-02-23 DIAGNOSIS — R2681 Unsteadiness on feet: Secondary | ICD-10-CM

## 2022-02-23 DIAGNOSIS — R278 Other lack of coordination: Secondary | ICD-10-CM

## 2022-02-23 DIAGNOSIS — R258 Other abnormal involuntary movements: Secondary | ICD-10-CM

## 2022-02-23 NOTE — Progress Notes (Signed)
Order placed for DaT scan.  ?

## 2022-02-23 NOTE — Therapy (Signed)
[place] [facility] ?[place], [place], [address] ?Phone: [phone]   Fax:  [phone] ? ?Physical Therapy Treatment ? ?Patient Details  ?Name: Crystal Lynch ?MRN: [id number] ?Date of Birth: September 06, 1941 ?Referring Provider (PT): Vladimir Crofts, MD ? ? ?Encounter Date: 02/23/2022 ? ? PT End of Session - 02/23/22 1058   ? ? Visit Number 13   ? Number of Visits 25   ? Date for PT Re-Evaluation 03/03/22   ? PT Start Time 1017   ? PT Stop Time 1059   ? PT Time Calculation (min) 42 min   ? Equipment Utilized During Treatment Gait belt   ? Activity Tolerance Patient tolerated treatment well   ? Behavior During Therapy [facility] for tasks assessed/performed   ? ?  ?  ? ?  ? ? ?Past Medical History:  ?Diagnosis Date  ? Arthritis   ? Chicken pox   ? Endometriosis   ? Esophagitis   ? s/p esophageal dilatation  ? GERD (gastroesophageal reflux disease)   ? History of ovarian cyst   ? Hypercholesterolemia   ? Hypertension   ? Hypothyroidism   ? ? ?Past Surgical History:  ?Procedure Laterality Date  ? ABDOMINAL SURGERY    ? found to have an ovarian cyst and endometriosis  ? APPENDECTOMY    ? ? ?There were no vitals filed for this visit. ? ? Subjective Assessment - 02/23/22 1025   ? ? Subjective Patient reports she is going to have to go back to [facility] for 3 days for procdure to try to remove some spinal fluid but waiting to hear when. She stated the leg press last time irritated her bursits.   ? Pertinent History Pt presents to PT evaluation ambulating with QC. Pt with c/o imbalance and dizziness that started >4 months ago. The pt is known to this clinic and was previously seen for vestibular therapy. Pt reports vestibular therapy did improve her symptoms some. Pt also recently seen by Speech due to memory changes. Pt seen by ENT and followed by neurologist (Dr. Manuella Ghazi). She reports recent imaging/work-up but that a cause for dizziness and imbalance has not been  determined. Pt current concerns and symptoms include: difficulty with turns, bothered by bright lights, FOF, problems with her vision (has eye appointment next week d/t cataracts, per pt). 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   ? Limitations Standing;House hold activities;Walking;Lifting;Reading   reading limited due to difficulty focusing, thinks due to vision  ? How long can you sit comfortably? not affected   ? How long can you stand comfortably? Pt reports she will lean against something due to unsteadiness when standing   ? How long can you walk comfortably? Pt ambulates with QC or holds onto her husband's ([name]) arm due to unsteadiness   ? 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 Patient Stated Goals Pt states, "I would like to be able to jump up out of my chair like I've always done without having to be cautious all the time, without feeling like I'm going to fall." Pt states, "I want my life back."   ? Currently in Pain? Yes   ? Pain  Score 2    ? Pain Location Hip   ? Pain Orientation Left   ? Pain Descriptors / Indicators Aching;Sore   ? Pain Type Chronic pain   ? Pain Onset More than a month ago   pt reports HA is on and off  ? Pain Frequency Intermittent   ? ?  ?  ? ?  ? ?INTERVENTIONS ? ?Therapeutic Exercises:  ? ? ?Nustep L0 LE only- 4 min total time- No complaint of any left Hip pain.  ? ? ?Seated Hip march- 3lb AW - alt LE x 12 reps x 2 sets ?  ?Seated hip abd with RTB (2 sets of 15 reps)  ?  ?Seated knee ext with 3lb AW alt LE x 15 reps x 2 sets ?  ?Seated ham curl with RTB x 15 reps x 2 sets ?  ?Seated calf raises 3lb AW  - 12 reps- Patient reported as easy so transitioned to leg press machine for calf raises- 40lb - BLE 2sets of 12 reps.  ?  ?  ?Seated Hip adduction squeeze with ball- 15 reps with 5 sec hold.  ?  ? ?Education provided throughout session via VC/TC and demonstration to facilitate movement at target joints and correct muscle activation for all testing and exercises performed.  ? ? ?*Reminders to perform above seated LE exercises at home with use of her ankle weight - as painfree on left hip as possible.  ? ? ? ? ? ? ? ? ? ? ? ? ? ? ? ? ? ? ? ? ? ? PT Short Term Goals - 01/27/22 0938   ? ?  ? PT SHORT TERM GOAL #1  ? Title Pt will be independent with HEP in order to improve strength and balance in order to decrease fall risk and improve function at home and work.   ? Baseline 2/17: instructed pt to resume her previous seated VORx1 HEP, provided activity/symptom tracker handout 4/7: pt reports completing HEP regularly.   ? Time 6   ? Period Weeks   ? Status Achieved   ? Target Date 01/20/22   ? ?  ?  ? ?  ? ? ? ? PT Long Term Goals - 01/27/22 1000   ? ?  ? PT LONG TERM GOAL #1  ? Title Patient will have improved FOTO score of 5 points or greater in order to demonstrate improvements in patient's ADLs and functional performance.   ? Baseline 2/17: 60% 4/7: 53%   ? Time 12   ? Period Weeks   ? Status Not Met   ? Target Date 03/03/22   ?  ? PT LONG TERM GOAL #2  ? Title Pt will improve ABC by at least 13% in order to demonstrate clinically significant improvement in balance confidence.   ? Baseline 2/17: to be assessed next 1-2 session (was 81% on 06/14/2021); 2/28: 40.3%, 4/7: 41.9%   ? Time 12   ? Period Weeks   ? Status Not Met   ? Target Date 03/03/22   ?  ? PT LONG TERM GOAL #3  ? Title Patient will demonstrate reduced falls risk as evidenced by Dynamic Gait Index (DGI) 21/24 or greater.   ? Baseline 2/17: 13/24 (was 23/24 on 06/14/2021) 4/7: 13/24   ? Time 12   ? Period Weeks   ? Status Partially Met   ? Target Date  03/03/22   ?  ? PT LONG TERM GOAL #4  ? Title Patient  will have improved gross B lower extremity to at least 4+/5 in order to improve patient's balance and mobility skills.   ? Baseline 2/17: grossly 4-/5 4/7: 4-/5 for hip flexion, 4   ? Time 12   ? Period Weeks   ? Status Not Met   ? Target Date 03/03/22   ?  ? PT LONG TERM GOAL #5  ? Title Patient will have improved 22mt gait speed to >1 m/s for improved community ambulation and gait ability.   ? Baseline 2/17: to be assessed next 1-2 sessions; 2/28: 0.83 m/s with QC .610m with QC   ? Time 12   ? Period Weeks   ? Status Not Met   ? Target Date 03/03/22   ? ?  ?  ? ?  ? ? ? ? ? ? ? ? Plan - 02/23/22 1538   ? ? Clinical Impression Statement Patient presented with good motivation for today's session. She reports wanting to just work on her strength without making her left hip pain worse. States scheduled for hip injection in next 2 weeks and [facility] She was able to perform all resistive LE strengthening  in nonweightbearing position today without significant difficulty. Will benefit from ongoing LE strengthening next visit. Pt will continue to beenfit from skilled PT services in order to improve her strength, balance, mobility and QOL   ? Personal Factors and Comorbidities Age;Sex;Fitness;Comorbidity 3+   ? 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   ? Examination-Activity Limitations Locomotion Level;Carry;Lift;Hygiene/Grooming;Bend;Reach Overhead;Stairs;Stand;Transfers   ? Examination-Participation Restrictions Community Activity;Yard Work;Cleaning;Shop   ? Stability/Clinical Decision Making  Evolving/Moderate complexity   ? Rehab Potential Good   ? PT Frequency 2x / week   ? PT Duration 12 weeks   ? PT Treatment/Interventions ADLs/Self Care Home Management;Aquatic Therapy;Biofeedback;Canalith Repostioning;Cryo

## 2022-02-24 ENCOUNTER — Ambulatory Visit: Payer: Medicare Other | Admitting: Internal Medicine

## 2022-02-24 ENCOUNTER — Telehealth: Payer: Self-pay | Admitting: Internal Medicine

## 2022-02-24 NOTE — Telephone Encounter (Signed)
S/w pt  - is not taking her spironolactone.  ?Pt advised to restart med and monitor pressure. ?Pt is at her granddaughters graduation, and will not be home until Sunday evening. ?Does not think she has the medication in her suitcase. ?Pt will monitor how she feels and will go to ER if chest pain, sob, elevated h.r , etc. ?Pt will start medication when she arrives home. ?Will update [place]. ?

## 2022-02-24 NOTE — Telephone Encounter (Signed)
Please confirm she is taking her spironolactone 1/2 tablet per day.  It is not on her med list, but she should be taking.  Also confirm taking her other blood pressure medications and not skipping.  If blood pressure is 200 - she needs to be seen.  If remaining elevated, will probably need to add another medication.   ?

## 2022-02-24 NOTE — Telephone Encounter (Signed)
Left 2 messages for patient to call office back ?

## 2022-02-24 NOTE — Telephone Encounter (Signed)
Patient says he talked to nurse this morning 159/77  pulse this morning 70, and yesterday she was at a function her BP was over 200/79-84 patient cannot come in refused appt she is leaving town  at 12 would like to know what she can take if her BP rises high the only symptom is jitteriness and face become red. ?

## 2022-02-28 ENCOUNTER — Telehealth: Payer: Self-pay | Admitting: Cardiovascular Disease

## 2022-02-28 ENCOUNTER — Ambulatory Visit: Payer: Medicare Other

## 2022-02-28 DIAGNOSIS — R2681 Unsteadiness on feet: Secondary | ICD-10-CM

## 2022-02-28 DIAGNOSIS — R2689 Other abnormalities of gait and mobility: Secondary | ICD-10-CM

## 2022-02-28 DIAGNOSIS — R42 Dizziness and giddiness: Secondary | ICD-10-CM

## 2022-02-28 DIAGNOSIS — R278 Other lack of coordination: Secondary | ICD-10-CM

## 2022-02-28 DIAGNOSIS — M6281 Muscle weakness (generalized): Secondary | ICD-10-CM

## 2022-02-28 MED ORDER — SPIRONOLACTONE 25 MG PO TABS: 12.5000 mg | ORAL_TABLET | Freq: Every day | ORAL | 1 refills | Status: DC

## 2022-02-28 MED ORDER — CARVEDILOL 6.25 MG PO TABS: 6.2500 mg | ORAL_TABLET | Freq: Two times a day (BID) | ORAL | 0 refills | Status: DC

## 2022-02-28 NOTE — Telephone Encounter (Signed)
? ? ?*  STAT* If patient is at the pharmacy, call can be transferred to refill team. ? ? ?1. Which medications need to be refilled? (please list name of each medication and dose if known) carvedilol (COREG) 6.25 MG tablet ? ?2. Which pharmacy/location (including street and city if local pharmacy) is medication to be sent to?[facility] [place], [place] to [facility] ? ?3. Do they need a 30 day or 90 day supply? 90 days ?

## 2022-02-28 NOTE — Telephone Encounter (Signed)
Requested Prescriptions  ? ?Signed Prescriptions Disp Refills  ? carvedilol (COREG) 6.25 MG tablet 180 tablet 0  ?  Sig: Take 1 tablet (6.25 mg total) by mouth 2 (two) times daily.  ?  Authorizing Provider: Kathlyn Sacramento A  ?  Ordering User: NEWCOMER MCCLAIN, Burl Tauzin L  ? ? ?

## 2022-02-28 NOTE — Therapy (Signed)
[facility] ?[place], [place], [address] ?Phone: [phone]   Fax:  [phone] ? ?Physical Therapy Treatment ? ?Patient Details  ?Name: Crystal Lynch ?MRN: [id number] ?Date of Birth: 03-29-1941 ?Referring Provider (PT): Vladimir Crofts, MD ? ? ?Encounter Date: 02/28/2022 ? ? PT End of Session - 02/28/22 1026   ? ? Visit Number 14   ? Number of Visits 25   ? Date for PT Re-Evaluation 03/03/22   ? PT Start Time 1016   ? PT Stop Time 1059   ? PT Time Calculation (min) 43 min   ? Equipment Utilized During Treatment Gait belt   ? Activity Tolerance Patient tolerated treatment well   ? Behavior During Therapy [facility] for tasks assessed/performed   ? ?  ?  ? ?  ? ? ?Past Medical History:  ?Diagnosis Date  ? Arthritis   ? Chicken pox   ? Endometriosis   ? Esophagitis   ? s/p esophageal dilatation  ? GERD (gastroesophageal reflux disease)   ? History of ovarian cyst   ? Hypercholesterolemia   ? Hypertension   ? Hypothyroidism   ? ? ?Past Surgical History:  ?Procedure Laterality Date  ? ABDOMINAL SURGERY    ? found to have an ovarian cyst and endometriosis  ? APPENDECTOMY    ? ? ?There were no vitals filed for this visit. ? ? Subjective Assessment - 02/28/22 1023   ? ? Subjective Patient reports she is still being bothered by her left hip bursitis- Going for injection next week at [facility]. Now has a scheduled procedure at [facility] in August 2023. .   ? Pertinent History Pt presents to PT evaluation ambulating with QC. Pt with c/o imbalance and dizziness that started >4 months ago. The pt is known to this clinic and was previously seen for vestibular therapy. Pt reports vestibular therapy did improve her symptoms some. Pt also recently seen by Speech due to memory changes. Pt seen by ENT and followed by neurologist (Dr. Manuella Ghazi). She reports recent imaging/work-up but that a cause for dizziness and imbalance has not been determined. Pt current concerns  and symptoms include: difficulty with turns, bothered by bright lights, FOF, problems with her vision (has eye appointment next week d/t cataracts, per pt). 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   ? Limitations Standing;House hold activities;Walking;Lifting;Reading   reading limited due to difficulty focusing, thinks due to vision  ? How long can you sit comfortably? not affected   ? How long can you stand comfortably? Pt reports she will lean against something due to unsteadiness when standing   ? How long can you walk comfortably? Pt ambulates with QC or holds onto her husband's ([name]) arm due to unsteadiness   ? 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 Patient Stated Goals Pt states, "I would like to be able to jump up out of my chair like I've always done without having to be cautious all the time, without feeling like I'm going to fall." Pt states, "I want my life back."   ? Currently in Pain? Yes   ? Pain Score 2    ? Pain Location  Hip   ? Pain Orientation Left   ? Pain Descriptors / Indicators Aching   ? Pain Type Chronic pain   ? Pain Onset More than a month ago   pt reports HA is on and off  ? Aggravating Factors  Riding in car, Prolonged standing and if I move my leg the wrong way   ? Pain Relieving Factors Rest, Tylenol arthritis   ? Effect of Pain on Daily Activities Difficulty riding in car/walking   ? ?  ?  ? ?  ? ? ? ? ?INTERVENTIONS ?  ?Therapeutic Exercises:  ?  ?  ?  ?  ?Standing Hip march- 2.5 lb AW - alt LE x 12 reps  ?  ?Standing  hip abd with 2.5 lb AW x 15 reps ?Standing Hip ext with 2.5 lb x 12 reps ?  ?Seated knee  ext with 2.5 lb AW alt LE x 15 reps x 2 sets ?  ?Standing  ham curl with 2.5 AW alt LE x 15 reps ?  ?Standing calf raises 2.5 lb AW - 12 reps-   ?  ?Seated Hip adduction squeeze with ball- 15 reps with 5 sec hold.  ?  ?  ?Education provided throughout session via VC/TC and demonstration to facilitate movement at target joints and correct muscle activation for all testing and exercises performed.  ? ? ? ? ? ? ? ? ? ? ? ? ? ? ? ? ? ? ? ? ? ? ? ? PT Education - 02/28/22 1026   ? ? Education Details Exercise technique   ? Person(s) Educated Patient   ? Methods Explanation;Demonstration;Tactile cues;Verbal cues   ? Comprehension Returned demonstration;Verbalized understanding;Tactile cues required;Verbal cues required;Need further instruction   ? ?  ?  ? ?  ? ? ? PT Short Term Goals - 01/27/22 0938   ? ?  ? PT SHORT TERM GOAL #1  ? Title Pt will be independent with HEP in order to improve strength and balance in order to decrease fall risk and improve function at home and work.   ? Baseline 2/17: instructed pt to resume her previous seated VORx1 HEP, provided activity/symptom tracker handout 4/7: pt reports completing HEP regularly.   ? Time 6   ? Period Weeks   ? Status Achieved   ? Target Date 01/20/22   ? ?  ?  ? ?  ? ? ? ? PT Long Term Goals - 01/27/22 1000   ? ?  ? PT LONG TERM GOAL #1  ? Title Patient will have improved FOTO score of 5 points or greater in order to demonstrate improvements in patient's ADLs and functional performance.   ? Baseline 2/17: 60% 4/7: 53%   ? Time 12   ? Period Weeks   ? Status Not Met   ? Target Date 03/03/22   ?  ? PT LONG TERM GOAL #2  ? Title Pt will improve ABC by at least 13% in order to demonstrate clinically significant improvement in balance confidence.   ? Baseline 2/17: to be assessed next 1-2 session (was 81% on 06/14/2021); 2/28: 40.3%, 4/7: 41.9%   ? Time 12   ? Period Weeks   ? Status Not Met   ? Target Date 03/03/22   ?  ? PT LONG TERM GOAL #3  ? Title Patient will  demonstrate reduced falls risk as evidenced by Dynamic Gait Index (DGI) 21/24 or greater.   ? Baseline 2/17: 13/24 (was 23/24 on  06/14/2021) 4/7: 13/24   ? Time 12   ? Period Weeks   ? Status Partially Met   ? Target Date 03/03/22   ?  ? PT LONG TERM GOAL #4  ? Title Patient will have improved gross B lower extremity to at least 4+/5 in order to improve patient's balance and mobility skills.   ? Baseline 2/17: grossly 4-/5 4/7: 4-/5 for hip flexion, 4   ? Time 12   ? Period Weeks   ? Status Not Met   ? Target Date 03/03/22   ?  ? PT LONG TERM GOAL #5  ? Title Patient will have improved 74mt gait speed to >1 m/s for improved community ambulation and gait ability.   ? Baseline 2/17: to be assessed next 1-2 sessions; 2/28: 0.83 m/s with QC .675m with QC   ? Time 12   ? Period Weeks   ? Status Not Met   ? Target Date 03/03/22   ? ?  ?  ? ?  ? ? ? ? ? ? ? ? Plan - 02/28/22 2236   ? ? Clinical Impression Statement Treatment continues to focus on LE strengthening per patient request due to sore left hip and injection scheduled for next week. She was able to progress to some standing activities without any immediate left hip pain and able to participate in some resistive training. Will try to continue to progress to more balance/weight bearing activities as appropriate. Patient will continue to benefit from ongoing LE strengtheinng and balance training to improve her overall strength, balance, and functional mobility with decreased risk of falling and improved quality of life.   ? Personal Factors and Comorbidities Age;Sex;Fitness;Comorbidity 3+   ? 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   ? Examination-Activity  Limitations Locomotion Level;Carry;Lift;Hygiene/Grooming;Bend;Reach Overhead;Stairs;Stand;Transfers   ? Examination-Participation Restrictions Community Activity;Yard Work;Cleaning;Shop   ? Stability/Clinical [place]

## 2022-02-28 NOTE — Telephone Encounter (Signed)
?  Pt c/o medication issue: ? ?1. Name of Medication: spironolactone ? ?2. How are you currently taking this medication (dosage and times per day)?  ? ?3. Are you having a reaction (difficulty breathing--STAT)?  ? ?4. What is your medication issue? Pt is requesting refill of this meds but its not on her med list ?

## 2022-03-01 ENCOUNTER — Other Ambulatory Visit: Payer: Self-pay

## 2022-03-01 ENCOUNTER — Telehealth: Payer: Self-pay

## 2022-03-01 MED ORDER — SPIRONOLACTONE 25 MG PO TABS: 12.5000 mg | ORAL_TABLET | Freq: Every day | ORAL | 1 refills | Status: DC

## 2022-03-01 NOTE — Telephone Encounter (Signed)
sent 

## 2022-03-01 NOTE — Telephone Encounter (Signed)
Patient called to request a refill of her Spironolactone ('25mg'$ ).  Patient said she is out.  Patient states her preferred pharmacy is [facility] on [address]. ?

## 2022-03-02 ENCOUNTER — Encounter: Payer: Self-pay | Admitting: Physical Therapy

## 2022-03-02 ENCOUNTER — Ambulatory Visit: Payer: Medicare Other | Admitting: Physical Therapy

## 2022-03-02 DIAGNOSIS — R2689 Other abnormalities of gait and mobility: Secondary | ICD-10-CM | POA: Diagnosis not present

## 2022-03-02 DIAGNOSIS — R42 Dizziness and giddiness: Secondary | ICD-10-CM

## 2022-03-02 DIAGNOSIS — R2681 Unsteadiness on feet: Secondary | ICD-10-CM

## 2022-03-02 DIAGNOSIS — M6281 Muscle weakness (generalized): Secondary | ICD-10-CM

## 2022-03-02 DIAGNOSIS — R278 Other lack of coordination: Secondary | ICD-10-CM

## 2022-03-02 NOTE — Therapy (Signed)
[facility] ?[place], [place], [address] ?Phone: [phone]   Fax:  [phone] ? ?Physical Therapy Treatment ? ?Patient Details  ?Name: Crystal Lynch ?MRN: [id number] ?Date of Birth: [birth year] ?Referring Provider (PT): Vladimir Crofts, MD ? ? ?Encounter Date: 03/02/2022 ? ? PT End of Session - 03/02/22 0815   ? ? Visit Number 15   ? Number of Visits 25   ? Date for PT Re-Evaluation 03/03/22   ? PT Start Time 0805   ? PT Stop Time 0845   ? PT Time Calculation (min) 40 min   ? Equipment Utilized During Treatment Gait belt   ? Activity Tolerance Patient tolerated treatment well   ? Behavior During Therapy [facility] for tasks assessed/performed   ? ?  ?  ? ?  ? ? ?Past Medical History:  ?Diagnosis Date  ? Arthritis   ? Chicken pox   ? Endometriosis   ? Esophagitis   ? s/p esophageal dilatation  ? GERD (gastroesophageal reflux disease)   ? History of ovarian cyst   ? Hypercholesterolemia   ? Hypertension   ? Hypothyroidism   ? ? ?Past Surgical History:  ?Procedure Laterality Date  ? ABDOMINAL SURGERY    ? found to have an ovarian cyst and endometriosis  ? APPENDECTOMY    ? ? ?There were no vitals filed for this visit. ? ? Subjective Assessment - 03/02/22 0811   ? ? Subjective Patient reports she is still being bothered by her left hip bursitis- Going for injection next week at [facility]. Now has a scheduled procedure at [facility] in August 2023.   ? Pertinent History Pt presents to PT evaluation ambulating with QC. Pt with c/o imbalance and dizziness that started >4 months ago. The pt is known to this clinic and was previously seen for vestibular therapy. Pt reports vestibular therapy did improve her symptoms some. Pt also recently seen by Speech due to memory changes. Pt seen by ENT and followed by neurologist (Dr. Manuella Ghazi). She reports recent imaging/work-up but that a cause for dizziness and imbalance has not been determined. Pt current concerns  and symptoms include: difficulty with turns, bothered by bright lights, FOF, problems with her vision (has eye appointment next week d/t cataracts, per pt). 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   ? Limitations Standing;House hold activities;Walking;Lifting;Reading   reading limited due to difficulty focusing, thinks due to vision  ? How long can you sit comfortably? not affected   ? How long can you stand comfortably? Pt reports she will lean against something due to unsteadiness when standing   ? How long can you walk comfortably? Pt ambulates with QC or holds onto her husband's ([name]) arm due to unsteadiness   ? 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 Patient Stated Goals Pt states, "I would like to be able to jump up out of my chair like I've always done without having to be cautious all the time, without feeling like I'm going to fall." Pt states, "I want my life back."   ? Currently in Pain? Yes   ? Pain Score 1    ? Pain Location Hip   ?  Pain Orientation Left   ? Pain Descriptors / Indicators Aching;Sore   ? Pain Type Chronic pain   ? Pain Onset More than a month ago   pt reports HA is on and off  ? Pain Frequency Intermittent   ? Aggravating Factors  riding in car/prolonged standing   ? Pain Relieving Factors rest/tylenol   ? Effect of Pain on Daily Activities decreased standing tolerance, difficulty walking long distances;   ? ?  ?  ? ?  ? ? ? ? ? ?  ?INTERVENTIONS ?  ?Therapeutic Exercises:  ?  ?Standing Hip march- 2.5 lb AW - alt LE x 12 reps  ?Standing  hip abd with 2.5 lb AW x 15 reps ?Standing Hip ext with 2.5 lb x 5 reps-  discontinued due to increased hip discomfort;  ?Standing  ham curl with 2.5 AW alt LE x 12 reps ?Standing calf raises 2.5 lb AW - 15 reps-  mmmmmmmmmmmmmm ? ?Forward/backward step over 1/2 bolster 2.5# x10 reps with 1 rail assist. Required min VCS for increased step length for better foot clearance;  ?  ?Seated knee ext with 2.5 lb AW alt LE x 15 reps x 2 sets, with cues for ankle DF to increase ROM/strengthening;  ?  ?Seated Hip adduction squeeze with ball- 15 reps with 2-3 sec hold.  ? ?Alternated between standing/seated exercise to help reduce fatigue. ?Education provided throughout session via VC/TC and demonstration to facilitate movement at target joints and correct muscle activation for all testing and exercises performed.  ?  ? ? ? ? ? ? ? ? ? ? ? ? ? ? ? ? ? ? ? ? ? ? ? PT Education - 03/02/22 0815   ? ? Education Details exercise technique;   ? Person(s) Educated Patient   ? Methods Explanation;Verbal cues   ? Comprehension Verbalized understanding;Returned demonstration;Verbal cues required;Need further instruction   ? ?  ?  ? ?  ? ? ? PT Short Term Goals - 01/27/22 0938   ? ?  ? PT SHORT TERM GOAL #1  ? Title Pt will be independent with HEP in order to improve strength and balance in order to decrease fall risk and improve function at home and work.   ? Baseline 2/17: instructed pt to resume her previous seated VORx1 HEP, provided activity/symptom tracker handout 4/7: pt reports completing HEP regularly.   ? Time 6   ? Period Weeks   ? Status Achieved   ? Target Date 01/20/22   ? ?  ?  ? ?  ? ? ? ? PT Long Term Goals - 01/27/22 1000   ? ?  ? PT LONG TERM GOAL #1  ? Title Patient will have improved FOTO score of 5 points or greater in order to demonstrate improvements in patient's ADLs and functional performance.   ? Baseline 2/17: 60% 4/7: 53%   ? Time 12   ? Period Weeks   ? Status Not Met   ? Target Date 03/03/22   ?  ? PT LONG TERM GOAL #2  ? Title Pt will improve ABC by at least 13% in order to  demonstrate clinically significant improvement in balance confidence.   ? Baseline 2/17: to be assessed next 1-2 session (was 81% on 06/14/2021); 2/28: 40.3%, 4/7: 41.9%   ? Time 12   ? Period Weeks   ? Status Not Met   ? Target Date 03/03/22   ?  ? PT LONG TERM GOAL #3  ?  Title Patient will demonstrate reduced falls risk as evidenced by Dynamic Gait Index (DGI) 21/24 or greater.   ? Baseline 2/17: 13/24 (was 23/24 on 06/14/2021) 4/7: 13/24   ? Time 12   ? Period Weeks   ? Status Partially Met   ? Target Date 03/03/22   ?  ? PT LONG TERM GOAL #4  ? Title Patient will have improved gross B lower extremity to at least 4+/5 in order to improve patient's balance and mobility skills.   ? Baseline 2/17: grossly 4-/5 4/7: 4-/5 for hip flexion, 4   ? Time 12   ? Period Weeks   ? Status Not Met   ? Target Date 03/03/22   ?  ? PT LONG TERM GOAL #5  ? Title Patient will have improved 75mt gait speed to >1 m/s for improved community ambulation and gait ability.   ? Baseline 2/17: to be assessed next 1-2 sessions; 2/28: 0.83 m/s with QC .661m with QC   ? Time 12   ? Period Weeks   ? Status Not Met   ? Target Date 03/03/22   ? ?  ?  ? ?  ? ? ? ? ? ? ? ? Plan - 03/02/22 0833   ? ? Clinical Impression Statement Treatment continues to focus on LE strengthening per patient request due to sore left hip and injection scheduled for next week. She is still limited with prolonged standing and some hip strengthening exercise due to hip pain. She is very cautious and holds onto railing for balance due to fear of falling and chronic dizziness. Progressed strengthening with stepping over 1/2 bolster. Patient would benefit from additional skilled PT intervention to improve strength, balance and mobility;   ? Personal Factors and Comorbidities Age;Sex;Fitness;Comorbidity 3+   ? 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   ? Examination-Activity Limit

## 2022-03-03 ENCOUNTER — Ambulatory Visit: Payer: Medicare Other

## 2022-03-03 ENCOUNTER — Encounter: Payer: Self-pay | Admitting: Internal Medicine

## 2022-03-03 ENCOUNTER — Ambulatory Visit (INDEPENDENT_AMBULATORY_CARE_PROVIDER_SITE_OTHER): Payer: Medicare Other | Admitting: Internal Medicine

## 2022-03-03 VITALS — BP 148/76 | HR 81 | Temp 97.4°F | Ht 62.0 in | Wt 127.6 lb

## 2022-03-03 DIAGNOSIS — I10 Essential (primary) hypertension: Secondary | ICD-10-CM

## 2022-03-03 DIAGNOSIS — E871 Hypo-osmolality and hyponatremia: Secondary | ICD-10-CM

## 2022-03-03 DIAGNOSIS — R413 Other amnesia: Secondary | ICD-10-CM

## 2022-03-03 DIAGNOSIS — E039 Hypothyroidism, unspecified: Secondary | ICD-10-CM

## 2022-03-03 DIAGNOSIS — R42 Dizziness and giddiness: Secondary | ICD-10-CM

## 2022-03-03 DIAGNOSIS — K760 Fatty (change of) liver, not elsewhere classified: Secondary | ICD-10-CM | POA: Diagnosis not present

## 2022-03-03 DIAGNOSIS — E7800 Pure hypercholesterolemia, unspecified: Secondary | ICD-10-CM

## 2022-03-03 DIAGNOSIS — I700 Atherosclerosis of aorta: Secondary | ICD-10-CM | POA: Diagnosis not present

## 2022-03-03 DIAGNOSIS — K209 Esophagitis, unspecified without bleeding: Secondary | ICD-10-CM

## 2022-03-03 LAB — BASIC METABOLIC PANEL
BUN: 18 mg/dL (ref 6–23)
CO2: 24 mEq/L (ref 19–32)
Calcium: 10 mg/dL (ref 8.4–10.5)
Chloride: 104 mEq/L (ref 96–112)
Creatinine, Ser: 0.81 mg/dL (ref 0.40–1.20)
GFR: 67.69 mL/min (ref >60.00)
Glucose, Bld: 117 mg/dL — ABNORMAL HIGH (ref 70–99)
Potassium: 3.7 mEq/L (ref 3.5–5.1)
Sodium: 137 mEq/L (ref 135–145)

## 2022-03-03 MED ORDER — SPIRONOLACTONE 25 MG PO TABS: 25.0000 mg | ORAL_TABLET | Freq: Every day | ORAL | 1 refills | Status: DC

## 2022-03-03 NOTE — Progress Notes (Signed)
Patient ID: Crystal Lynch, female   DOB: 28-Dec-1940, 81 y.o.   MRN: [id number]   Subjective:    Patient ID: Crystal Lynch, female    DOB: Feb 16, 1941, 81 y.o.   MRN: [id number]  This visit occurred during the SARS-CoV-2 public health emergency.  Safety protocols were in place, including screening questions prior to the visit, additional usage of staff PPE, and extensive cleaning of exam room while observing appropriate contact time as indicated for disinfecting solutions.   Patient here for scheduled follow up.    HPI Here to follow up regarding her blood pressure.  Recent evaluation for memory change and balance difficulty.  MRI - cerebral ventriculomegaly.  NeuroQuant study showed hippocampal volume at 19th percentile. She was referred to the CSF disorder clinic at [facility] for evaluation of possible normal pressure hydrocephalus versus a neurodegenerative condition. A large-volume lumbar puncture was performed on 02/02/22, however only 72m CSF could be drained. 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 CSF biomarkers were negative for AD. It was recommended to proceed with an extended lumbar drain.  Also recommended a DaTscan.  I have placed an order for the scan and we are waiting for this to be scheduled.  She reports she feels relatively stable.  No significant headache.  Eating. No nausea or vomiting.  No chest pain.  Concern regarding elevated blood pressure.  She was off spironolactone recently, but reports has started back on 1/2 tablet per day.  She did confirm she is also taking carvedilol and lisinopril.     Past Medical History:  Diagnosis Date   Arthritis    Chicken pox    Endometriosis    Esophagitis    s/p esophageal dilatation   GERD (gastroesophageal reflux disease)    History of ovarian cyst    Hypercholesterolemia    Hypertension    Hypothyroidism    Past Surgical History:  Procedure Laterality  Date   ABDOMINAL SURGERY     found to have an ovarian cyst and endometriosis   APPENDECTOMY     Family History  Problem Relation Age of Onset   Lung disease Father    Heart attack Father    Heart disease Mother        myocardial infarction   Breast cancer Mother 58  Heart attack Mother    Hypertension Mother    Hyperlipidemia Mother    Breast cancer Sister [phone]  Hypertension Sister    Rheum arthritis Sister    Breast cancer Maternal Aunt [phone]  Hypertension Brother    Colon cancer Neg Hx    Social History   Socioeconomic History   Marital status: Married    Spouse name: Not on file   Number of children: Not on file   Years of education: Not on file   Highest education level: Not on file  Occupational History   Not on file  Tobacco Use   Smoking status: Never   Smokeless tobacco: Never  Vaping Use   Vaping Use: Never used  Substance and Sexual Activity   Alcohol use: No    Alcohol/week: 0.0 standard drinks   Drug use: No   Sexual activity: Never  Other Topics Concern   Not on file  Social History Narrative   Not on file   Social Determinants of Health   Financial Resource Strain: Low Risk    Difficulty of Paying Living Expenses: Not hard at all  Food Insecurity: No Food Insecurity   Worried About Charity fundraiser in the Last Year: Never true   Ran Out of Food in the Last Year: Never true  Transportation Needs: No Transportation Needs   Lack of Transportation (Medical): No   Lack of Transportation (Non-Medical): No  Physical Activity: Sufficiently Active   Days of Exercise per Week: 5 days   Minutes of Exercise per Session: 30 min  Stress: No Stress Concern Present   Feeling of Stress : Not at all  Social Connections: Unknown   Frequency of Communication with Friends and Family: More than three times a week   Frequency of Social Gatherings with Friends and Family: More than three times a week   Attends Religious Services: More than 4 times per year    Active Member of [employer] or Organizations: Not on file   Attends Archivist Meetings: Not on file   Marital Status: Married     Review of Systems  Constitutional:  Negative for appetite change and unexpected weight change.  HENT:  Negative for congestion and sinus pressure.   Respiratory:  Negative for cough, chest tightness and shortness of breath.   Cardiovascular:  Negative for chest pain, palpitations and leg swelling.  Gastrointestinal:  Negative for abdominal pain, diarrhea, nausea and vomiting.  Genitourinary:  Negative for difficulty urinating and dysuria.  Musculoskeletal:  Negative for joint swelling and myalgias.  Skin:  Negative for color change and rash.  Neurological:        No increased headache. Still with some dizziness and unsteadiness.   Psychiatric/Behavioral:  Negative for agitation and dysphoric mood.       Objective:     BP (!) 148/76 (BP Location: Left Arm, Patient Position: Sitting, Cuff Size: Normal)   Pulse 81   Temp (!) 97.4 F (36.3 C) (Oral)   Ht '5\' 2"'  (1.575 m)   Wt 127 lb 9.6 oz (57.9 kg)   LMP 10/21/1984   SpO2 97%   BMI 23.34 kg/m  Wt Readings from Last 3 Encounters:  03/03/22 127 lb 9.6 oz (57.9 kg)  01/26/22 131 lb 6.4 oz (59.6 kg)  01/10/22 132 lb 6.4 oz (60.1 kg)    Physical Exam Vitals reviewed.  Constitutional:      General: She is not in acute distress.    Appearance: Normal appearance.  HENT:     Head: Normocephalic and atraumatic.     Right Ear: External ear normal.     Left Ear: External ear normal.  Eyes:     General: No scleral icterus.       Right eye: No discharge.        Left eye: No discharge.     Conjunctiva/sclera: Conjunctivae normal.  Neck:     Thyroid: No thyromegaly.  Cardiovascular:     Rate and Rhythm: Normal rate and regular rhythm.  Pulmonary:     Effort: No respiratory distress.     Breath sounds: Normal breath sounds. No wheezing.  Abdominal:     General: Bowel sounds are normal.      Palpations: Abdomen is soft.     Tenderness: There is no abdominal tenderness.  Musculoskeletal:        General: No swelling or tenderness.     Cervical back: Neck supple. No tenderness.  Lymphadenopathy:     Cervical: No cervical adenopathy.  Skin:    Findings: No erythema or rash.  Neurological:     Mental Status: She is alert.  Comments: Unsteady gait.   Psychiatric:        Mood and Affect: Mood normal.        Behavior: Behavior normal.     Outpatient Encounter Medications as of 03/03/2022  Medication Sig   aspirin 81 MG EC tablet Take 1 tablet (81 mg total) by mouth daily. Swallow whole.   Biotin 5 MG CAPS Take 5,000 mcg by mouth daily.   carvedilol (COREG) 6.25 MG tablet Take 1 tablet (6.25 mg total) by mouth 2 (two) times daily.   cholecalciferol (VITAMIN D) 25 MCG (1000 UNIT) tablet Take 1,000 Units by mouth daily.   dorzolamide-timolol (COSOPT) 22.3-6.8 MG/ML ophthalmic solution Place 1 drop into both eyes 2 (two) times daily.   levothyroxine (SYNTHROID) 75 MCG tablet TAKE 1 TABLET BY MOUTH EVERY DAY   lisinopril (ZESTRIL) 40 MG tablet TAKE 1 TABLET BY MOUTH EVERY DAY   LORazepam (ATIVAN) 0.5 MG tablet Take 1/2 tablet prior to procedure.  May repeat x 1 if needed.   Multiple Vitamins-Minerals (ZINC PO) Take 1 tablet by mouth daily.   pantoprazole (PROTONIX) 40 MG tablet TAKE 1 TABLET BY MOUTH EVERY DAY (WAITING ON PA)   rosuvastatin (CRESTOR) 10 MG tablet TAKE 1 TABLET BY MOUTH EVERY DAY   [DISCONTINUED] spironolactone (ALDACTONE) 25 MG tablet Take 0.5 tablets (12.5 mg total) by mouth daily.   spironolactone (ALDACTONE) 25 MG tablet Take 1 tablet (25 mg total) by mouth daily.   No facility-administered encounter medications on file as of 03/03/2022.     Lab Results  Component Value Date   WBC 7.9 03/02/2021   HGB 14.4 03/02/2021   HCT 42.4 03/02/2021   PLT 297.0 03/02/2021   GLUCOSE 117 (H) 03/03/2022   CHOL 143 11/14/2021   TRIG 154.0 (H) 11/14/2021   HDL 49.10  11/14/2021   LDLDIRECT 88.0 03/22/2021   LDLCALC 63 11/14/2021   ALT 20 11/14/2021   AST 16 11/14/2021   NA 137 03/03/2022   K 3.7 03/03/2022   CL 104 03/03/2022   CREATININE 0.81 03/03/2022   BUN 18 03/03/2022   CO2 24 03/03/2022   TSH 2.06 11/14/2021   INR 1.0 08/12/2015    US Abdomen Complete  Result Date: 11/30/2021 CLINICAL DATA:  Hyperbilirubinemia. EXAM: ABDOMEN ULTRASOUND COMPLETE COMPARISON:  Ultrasound 11/26/2019.  CT 05/08/2018. FINDINGS: Gallbladder: No gallstones or wall thickening visualized. No sonographic Murphy sign noted by sonographer. Common bile duct: Diameter: 2.8 mm Liver: Coarse heterogeneous hepatic parenchymal pattern consistent with fatty infiltration and or hepatocellular disease. No focal hepatic abnormality identified. Portal vein is patent on color Doppler imaging with normal direction of blood flow towards the liver. IVC: No abnormality visualized. Pancreas: Pancreas is only partially visualized. No focal pancreatic abnormalities identified by ultrasound. Reference is made to CT report of 05/08/2018. Spleen: Size and appearance within normal limits. Right Kidney: Length: 10.4 cm. Echogenicity within normal limits. No mass or hydronephrosis visualized. Left Kidney: Length: 10.2 cm. Echogenicity within normal limits. A 0.5 cm hyperechoic focus is noted in the midportion of the left kidney. This was present on prior ultrasound of 11/26/2019 and appears stable. 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 A 2.0 cm renal cyst or parapelvic cyst is again noted. No hydronephrosis visualized. Abdominal aorta: No aneurysm visualized. Other findings: None. IMPRESSION: 1.  No gallstones or biliary distention. 2. Coarse heterogeneous hepatic parenchymal pattern consistent with fatty infiltration and or hepatocellular disease. No focal hepatic abnormality identified. 3. Pancreas is only  partially visualized. No focal  pancreatic abnormalities noted by this ultrasound. Reference is made to prior CT report of 05/08/2018. 4. A 0.5 cm left renal hyperechoic focus is noted in the midportion of the left kidney. This was present on prior ultrasound of 11/26/2019 and appears stable. 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 A 2.0 cm left renal cyst or parapelvic cyst is also again noted. Electronically Signed   By: Marcello Moores  Register M.D.   On: 11/30/2021 06:24       Assessment & Plan:   Problem List Items Addressed This Visit     Aortic atherosclerosis ([place])    Continue crestor.         Relevant Medications   spironolactone (ALDACTONE) 25 MG tablet   Dizziness    Persistent issue.  Evaluated by ENT.  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  Recommended vestibular rehab and evaluation by neurology.  Neurology evaluation as outlined.  MRI as outlined.  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 Seeing PT for core strengthening, balance and gait issues. She was referred to the CSF disorder clinic at [facility] for evaluation of possible normal pressure hydrocephalus versus a neurodegenerative condition. A large-volume lumbar puncture was performed on 02/02/22, however only 559m CSF could be drained. 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 CSF biomarkers were negative for AD. It was recommended to proceed with an extended lumbar drain.  Also recommended a DaTscan.  I have placed an order for the scan and we are waiting for this to be scheduled.         Esophagitis    Continue protonix.        Fatty liver    Found on ultrasound.  Follow liver function tests.         Hypercholesterolemia    Low cholesterol diet and exercise.  Continue crestor.  Follow lipid panel and liver function tests.         Relevant Medications   spironolactone (ALDACTONE) 25 MG tablet    Hypertension - Primary    Blood pressure remaining elevated.  Continue coreg at current dose.  We recently increased lisinopril to 44mq day.  Currently on aldactone 12.59m60m day.  Will increase to 259m18mday.  Monitor sodium and potassium, along with renal function with this change.   Follow pressures.  Check metabolic panel.  Has appt next week with Dr AridFletcher Anon/u blood pressure at that visit.  Consider f/u met b.        Relevant Medications   spironolactone (ALDACTONE) 25 MG tablet   Other Relevant Orders   Basic Metabolic Panel (BMET) (Completed)   Hyponatremia    Sodium has been better with previous fluid restriction.  Will attempt to increase aldactone back to 259mg16may.  Follow sodium, potassium and kidney function .       Hypothyroidism    On thyroid replacement.  Follow tsh.        Memory change    Seeing neurology.  MRI as outlined.   [facility] versus a neurodegenerative condition. A large-volume lumbar puncture was performed on 02/02/22, however only 8ml C54mcould be drained. 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 CSF biomarkers were negative for AD. It was recommended to proceed  with an extended lumbar drain.  Also recommended a DaTscan.  I have placed an order for the scan and we are waiting for this to be scheduled.           Einar Pheasant, MD

## 2022-03-07 ENCOUNTER — Ambulatory Visit: Payer: Medicare Other | Admitting: Physical Therapy

## 2022-03-07 DIAGNOSIS — R2689 Other abnormalities of gait and mobility: Secondary | ICD-10-CM

## 2022-03-07 DIAGNOSIS — R2681 Unsteadiness on feet: Secondary | ICD-10-CM

## 2022-03-08 NOTE — Addendum Note (Signed)
Addended by: Rivka Barbara B on: 03/08/2022 08:19 AM ? ? Modules accepted: Orders ? ?

## 2022-03-08 NOTE — Therapy (Signed)
?[facility] ?[place], [place], [address] ?Phone: [phone]   Fax:  [phone] ? ?Physical Therapy Treatment ? ?Patient Details  ?Name: Crystal Lynch ?MRN: [id number] ?Date of Birth: Mar 06, 1941 ?Referring Provider (PT): Vladimir Crofts, MD ? ? ?Encounter Date: 03/07/2022 ? ? PT End of Session - 03/07/22 1022   ? ? Visit Number 16   ? Number of Visits 25   ? Date for PT Re-Evaluation 05/02/22   ? PT Start Time 1017   ? PT Stop Time 1059   ? PT Time Calculation (min) 42 min   ? Equipment Utilized During Treatment Gait belt   ? Activity Tolerance Patient tolerated treatment well   ? Behavior During Therapy [facility] for tasks assessed/performed   ? ?  ?  ? ?  ? ? ?Past Medical History:  ?Diagnosis Date  ? Arthritis   ? Chicken pox   ? Endometriosis   ? Esophagitis   ? s/p esophageal dilatation  ? GERD (gastroesophageal reflux disease)   ? History of ovarian cyst   ? Hypercholesterolemia   ? Hypertension   ? Hypothyroidism   ? ? ?Past Surgical History:  ?Procedure Laterality Date  ? ABDOMINAL SURGERY    ? found to have an ovarian cyst and endometriosis  ? APPENDECTOMY    ? ? ?There were no vitals filed for this visit. ? ? Subjective Assessment - 03/07/22 1017   ? ? Subjective Pt reports she is getting inpatient and is wanting to do more and more.   ? Pertinent History Pt presents to PT evaluation ambulating with QC. Pt with c/o imbalance and dizziness that started >4 months ago. The pt is known to this clinic and was previously seen for vestibular therapy. Pt reports vestibular therapy did improve her symptoms some. Pt also recently seen by Speech due to memory changes. Pt seen by ENT and followed by neurologist (Dr. Manuella Ghazi). She reports recent imaging/work-up but that a cause for dizziness and imbalance has not been determined. Pt current concerns and symptoms include: difficulty with turns, bothered by bright lights, FOF, problems with her vision (has  eye appointment next week d/t cataracts, per pt). 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   ? Limitations Standing;House hold activities;Walking;Lifting;Reading   reading limited due to difficulty focusing, thinks due to vision  ? How long can you sit comfortably? not affected   ? How long can you stand comfortably? Pt reports she will lean against something due to unsteadiness when standing   ? How long can you walk comfortably? Pt ambulates with QC or holds onto her husband's ([name]) arm due to unsteadiness   ? 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 Patient Stated Goals Pt states, "I would like to be able to jump up out of my chair like I've always done without having to be cautious all the time, without feeling like I'm going to fall." Pt states, "I want my life back."   ? Pain Onset More than a month ago   pt reports HA is on and off  ? ?  ?  ? ?  ? ? ? ?Physical therapy treatment session today consisted  of completing assessment of goals and administration of testing as demonstrated in flow sheet. Addition treatments may be found below.  ? ? [facility] PT Assessment - 03/08/22 0001   ? ?  ? Dynamic Gait Index  ? Level Surface Mild Impairment   ? Change in Gait Speed Mild Impairment   ? Gait with Horizontal Head Turns Mild Impairment   ? Gait with Vertical Head Turns Mild Impairment   ? Gait and Pivot Turn Mild Impairment   ? Step Over Obstacle Moderate Impairment   ? Step Around Obstacles Mild Impairment   ? Steps Moderate Impairment   ? Total Score 14   ? ?  ?  ? ?  ? ?Physical therapy treatment session today consisted of completing  assessment of goals and administration of testing as demonstrated in flow sheet and goals section of this exam . Addition treatments may be found below.  ? ?Patient thoroughly instructed in proper sequencing of utilization of quad cane.  When patient does demonstrate proper sequencing patient demonstrates improved stability and balance but subjectively and subjectively feels more stable.  However, with increased distraction patient is unable to sustain this gait pattern.  Instructions may include objects in path as well as conversation or visual scanning the room. ? ?Pt educated throughout session about proper posture and technique with exercises. Improved exercise technique, movement at target joints, use of target muscles after min to mod verbal, visual, tactile cues. ? ? ? ? ? ? ? ? ? ? ? ? ? ? ? ? ? ? ? ? ? ? ? ? ? PT Education - 03/07/22 1022   ? ? Education Details exercise technique   ? Person(s) Educated Patient   ? Methods Explanation   ? Comprehension Verbalized understanding   ? ?  ?  ? ?  ? ? ? PT Short Term Goals - 03/08/22 0808   ? ?  ? PT SHORT TERM GOAL #1  ? Title Pt will be independent with HEP in order to improve strength and balance in order to decrease fall risk and improve function at home and work.   ? Baseline 2/17: instructed pt to resume her previous seated VORx1 HEP, provided activity/symptom tracker handout 4/7: pt reports completing HEP regularly.   ? Time 6   ? Period Weeks   ? Status Achieved   ? Target Date 01/20/22   ?  ? PT SHORT TERM GOAL #2  ? Title Patient will ambulate for 2 minutes with quad cane with proper sequencing and step through gait pattern in order to demonstrate improved efficacy and safety for community ambulation   ? Baseline Patient unable to ambulate prolonged distance with proper sequencing.  Patient has to stop and reset several times throughout ambulation trials to sustain proper sequencing.   ? Time 4   ? Period Weeks   ? Status New   ? Target Date 04/05/22    ? ?  ?  ? ?  ? ? ? ? PT Long Term Goals - 03/07/22 1039   ? ?  ? PT LONG TERM GOAL #1  ? Title Patient will have improved FOTO score of 5 points or greater in order to demonstrate improvements in patient's ADLs and functional performance.   ? Baseline 2/17: 60% 4/7: 53% 5/16: 50%   ? Time 12   ? Period Weeks   ? Status Not Met   ? Target Date 05/30/22   ?  ? PT LONG TERM GOAL #2  ?  Title Pt will improve ABC by at least 13% in order to demonstrate clinically significant improvement in balance confidence.   ? Baseline 2/17: to be assessed next 1-2 session (was 81% on 06/14/2021); 2/28: 40.3%, 4/7: 41.9% 5/16: 58.125%   ? Time 12   ? Period Weeks   ? Status Achieved   ? Target Date 05/30/22   ?  ? PT LONG TERM GOAL #3  ? Title Patient will demonstrate reduced falls risk as evidenced by Dynamic Gait Index (DGI) 21/24 or greater.   ? Baseline 2/17: 13/24 (was 23/24 on 06/14/2021) 4/7: 13/24 5/16: 14/24 with SBQC   ? Time 12   ? Period Weeks   ? Status Partially Met   ? Target Date 05/30/22   ?  ? PT LONG TERM GOAL #4  ? Title Patient will have improved gross B lower extremity to at least 4+/5 in order to improve patient's balance and mobility skills.   ? Baseline 2/17: grossly 4-/5 4/7: 4-/5 for hip flexion, 4 5/16: 4/5 hip flexion, 4+ knee extension, 4/5 knee flexion, 4+ hip abduciton seated, 5/5 hip abduction seated   ? Time 12   ? Period Weeks   ? Status On-going   ? Target Date 05/30/22   ?  ? PT LONG TERM GOAL #5  ? Title Patient will have improved 71mt gait speed to >1 m/s for improved community ambulation and gait ability.   ? Baseline 2/17: to be assessed next 1-2 sessions; 2/28: 0.83 m/s with QC .656m with QC 5/16: .7322m  ? Time 12   ? Period Weeks   ? Status Not Met   ? Target Date 05/30/22   ?  ? Additional Long Term Goals  ? Additional Long Term Goals Yes   ?  ? PT LONG TERM GOAL #6  ? Title Pt will ambulate for 2 minutes with cognitive dual task and with proper sequencing of LBQC in order to indicate  safety and efficient community ambulation   ? Baseline Pt unable to hold conversation and maintain proper cane sequencing of step through gait pattern.   ? Time 12   ? Period Weeks   ? Status New   ? Target Date 08

## 2022-03-09 ENCOUNTER — Encounter: Payer: Self-pay | Admitting: Physical Therapy

## 2022-03-09 ENCOUNTER — Other Ambulatory Visit
Admission: RE | Admit: 2022-03-09 | Discharge: 2022-03-09 | Disposition: A | Discharge status: Home / Self Care | Payer: Medicare Other | Attending: Cardiovascular Disease | Admitting: Cardiovascular Disease

## 2022-03-09 ENCOUNTER — Ambulatory Visit: Payer: Medicare Other | Admitting: Physical Therapy

## 2022-03-09 ENCOUNTER — Encounter: Payer: Self-pay | Admitting: Internal Medicine

## 2022-03-09 ENCOUNTER — Encounter: Payer: Self-pay | Admitting: Cardiovascular Disease

## 2022-03-09 ENCOUNTER — Ambulatory Visit (INDEPENDENT_AMBULATORY_CARE_PROVIDER_SITE_OTHER): Payer: Medicare Other | Admitting: Cardiovascular Disease

## 2022-03-09 VITALS — BP 114/60 | HR 71 | Ht 62.0 in | Wt 126.4 lb

## 2022-03-09 DIAGNOSIS — R2689 Other abnormalities of gait and mobility: Secondary | ICD-10-CM

## 2022-03-09 DIAGNOSIS — E785 Hyperlipidemia, unspecified: Secondary | ICD-10-CM

## 2022-03-09 DIAGNOSIS — R2681 Unsteadiness on feet: Secondary | ICD-10-CM

## 2022-03-09 DIAGNOSIS — I10 Essential (primary) hypertension: Secondary | ICD-10-CM | POA: Insufficient documentation

## 2022-03-09 LAB — BASIC METABOLIC PANEL
Anion gap: 7 (ref 5–15)
BUN: 17 mg/dL (ref 8–23)
CO2: 27 mmol/L (ref 22–32)
Calcium: 10.2 mg/dL (ref 8.9–10.3)
Chloride: 105 mmol/L (ref 98–111)
Creatinine, Ser: 0.77 mg/dL (ref 0.44–1.00)
GFR, Estimated: >60 mL/min (ref >60)
Glucose, Bld: 113 mg/dL — ABNORMAL HIGH (ref 70–99)
Potassium: 4.1 mmol/L (ref 3.5–5.1)
Sodium: 139 mmol/L (ref 135–145)

## 2022-03-09 NOTE — Assessment & Plan Note (Signed)
Sodium has been better with previous fluid restriction.  Will attempt to increase aldactone back to '25mg'$  q day.  Follow sodium, potassium and kidney function .

## 2022-03-09 NOTE — Assessment & Plan Note (Signed)
Low cholesterol diet and exercise.  Continue crestor.  Follow lipid panel and liver function tests.   

## 2022-03-09 NOTE — Assessment & Plan Note (Signed)
Continue crestor 

## 2022-03-09 NOTE — Assessment & Plan Note (Signed)
Continue protonix  

## 2022-03-09 NOTE — Assessment & Plan Note (Signed)
Blood pressure remaining elevated.  Continue coreg at current dose.  We recently increased lisinopril to 85m q day.  Currently on aldactone 12.5107mq day.  Will increase to 2583m day.  Monitor sodium and potassium, along with renal function with this change.   Follow pressures.  Check metabolic panel.  Has appt next week with Dr AriFletcher AnonF/u blood pressure at that visit.  Consider f/u met b.

## 2022-03-09 NOTE — Therapy (Signed)
[place] MAIN [facility] SERVICES [address] [place], [place], [address] Phone: [phone]   Fax:  [phone]  Physical Therapy Treatment  Patient Details  Name: Crystal Lynch MRN: [id number] Date of Birth: [birth year] Referring Provider (PT): Vladimir Crofts, MD   Encounter Date: 03/09/2022   PT End of Session - 03/09/22 0952     Visit Number 17    Number of Visits 25    Date for PT Re-Evaluation 05/02/22    PT Start Time 1000    PT Stop Time 1045    PT Time Calculation (min) 45 min    Equipment Utilized During Treatment Gait belt    Activity Tolerance Patient tolerated treatment well    Behavior During Therapy WFL for tasks assessed/performed             Past Medical History:  Diagnosis Date   Arthritis    Chicken pox    Endometriosis    Esophagitis    s/p esophageal dilatation   GERD (gastroesophageal reflux disease)    History of ovarian cyst    Hypercholesterolemia    Hypertension    Hypothyroidism     Past Surgical History:  Procedure Laterality Date   ABDOMINAL SURGERY     found to have an ovarian cyst and endometriosis   APPENDECTOMY      There were no vitals filed for this visit.   Subjective Assessment - 03/09/22 0951     Subjective Pt reports no falls, stumbles, or LOB since last visit and reports she has been very careful. Her cardiologist beleives she may be having a lot of stress as a result whch has been affecting her blood pressure. Pt reports getting shot in her hip for bursitis tomorrow.    Pertinent History Pt presents to PT evaluation ambulating with QC. Pt with c/o imbalance and dizziness that started >4 months ago. The pt is known to this clinic and was previously seen for vestibular therapy. Pt reports vestibular therapy did improve her symptoms some. Pt also recently seen by Speech due to memory changes. Pt seen by ENT and followed by neurologist (Dr. Manuella Ghazi). She reports recent imaging/work-up but  that a cause for dizziness and imbalance has not been determined. Pt current concerns and symptoms include: difficulty with turns, bothered by bright lights, FOF, problems with her vision (has eye appointment next week d/t cataracts, per pt). 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    Limitations Standing;House hold activities;Walking;Lifting;Reading   reading limited due to difficulty focusing, thinks due to vision   How long can you sit comfortably? not affected    How long can you stand comfortably? Pt reports she will lean against something due to unsteadiness when standing    How long can you walk comfortably? Pt ambulates with QC or holds onto her husband's ([name]) arm due to unsteadiness    Diagnostic tests Per chart of MRI BRAIN 1/17 "Parenchymal volume loss and mild to moderate chronic microvascular ischemic changes"    Patient Stated Goals Pt states, "I would like to be able to jump up out of my chair like I've always done without having to be cautious all the time, without feeling like I'm going to fall." Pt states, "I want my life back."  Currently in Pain? Yes    Pain Score 3     Pain Location Hip    Pain Orientation Left    Pain Descriptors / Indicators Aching;Sore    Pain Type Chronic pain    Pain Onset More than a month ago   pt reports HA is on and off             INTERVENTIONS  Exercise/Activity Sets/Reps/Time/ Resistance Assistance Charge type Comments  Gait training quad cane  8 x 30 feet  1*75 feet  CGA Gait training Progressive practice from simple to more complex task such as performing turns as well completing prolonged distances.  Trialed 1 bout of 150 feet with HurriCaine with  patient demonstrated decreased stability with this task.  Patient struck to continue to practice with quad cane at home we will continue to progress to more difficult situations in clinic to further improve her stability and efficacy with quad cane.  Standing anterior and lateral toe taps  X 15 ea side with 2.5# AW  UE Therex  Cues for sequencing, no significant fatigue noted,   Seated LAQ  20 x 2.5#   therex   Standing knee flexion and hip extension  X 10 ea  UE therex   Standing heel raises  2 x 10 x 2.5 # Aws  UE therex Good form with minimal need for cues                                       Treatment Provided this session   Pt educated throughout session about proper posture and technique with exercises. Improved exercise technique, movement at target joints, use of target muscles after min to mod verbal, visual, tactile cues. Note: Portions of this document were prepared using Dragon voice recognition software and although reviewed may contain unintentional dictation errors in syntax, grammar, or spelling.                             PT Education - 03/09/22 0951     Education Details Exercise technique    Person(s) Educated Patient    Methods Explanation    Comprehension Verbalized understanding              PT Short Term Goals - 03/08/22 0808       PT SHORT TERM GOAL #1   Title Pt will be independent with HEP in order to improve strength and balance in order to decrease fall risk and improve function at home and work.    Baseline 2/17: instructed pt to resume her previous seated VORx1 HEP, provided activity/symptom tracker handout 4/7: pt reports completing HEP regularly.    Time 6    Period Weeks    Status Achieved    Target Date 01/20/22      PT SHORT TERM GOAL #2   Title Patient will ambulate for 2 minutes with quad cane with proper sequencing and step through gait pattern in order to demonstrate improved efficacy and safety for community  ambulation    Baseline Patient unable to ambulate prolonged distance with proper sequencing.  Patient has to stop and reset several times throughout ambulation trials to sustain proper sequencing.    Time 4    Period Weeks    Status New    Target Date 04/05/22  PT Long Term Goals - 03/07/22 1039       PT LONG TERM GOAL #1   Title Patient will have improved FOTO score of 5 points or greater in order to demonstrate improvements in patient's ADLs and functional performance.    Baseline 2/17: 60% 4/7: 53% 5/16: 50%    Time 12    Period Weeks    Status Not Met    Target Date 05/30/22      PT LONG TERM GOAL #2   Title Pt will improve ABC by at least 13% in order to demonstrate clinically significant improvement in balance confidence.    Baseline 2/17: to be assessed next 1-2 session (was 81% on 06/14/2021); 2/28: 40.3%, 4/7: 41.9% 5/16: 58.125%    Time 12    Period Weeks    Status Achieved    Target Date 05/30/22      PT LONG TERM GOAL #3   Title Patient will demonstrate reduced falls risk as evidenced by Dynamic Gait Index (DGI) 21/24 or greater.    Baseline 2/17: 13/24 (was 23/24 on 06/14/2021) 4/7: 13/24 5/16: 14/24 with SBQC    Time 12    Period Weeks    Status Partially Met    Target Date 05/30/22      PT LONG TERM GOAL #4   Title Patient will have improved gross B lower extremity to at least 4+/5 in order to improve patient's balance and mobility skills.    Baseline 2/17: grossly 4-/5 4/7: 4-/5 for hip flexion, 4 5/16: 4/5 hip flexion, 4+ knee extension, 4/5 knee flexion, 4+ hip abduciton seated, 5/5 hip abduction seated    Time 12    Period Weeks    Status On-going    Target Date 05/30/22      PT LONG TERM GOAL #5   Title Patient will have improved 58mt gait speed to >1 m/s for improved community ambulation and gait ability.    Baseline 2/17: to be assessed next 1-2 sessions; 2/28: 0.83 m/s with QC .668m with QC 5/16: .7326m   Time 12    Period  Weeks    Status Not Met    Target Date 05/30/22      Additional Long Term Goals   Additional Long Term Goals Yes      PT LONG TERM GOAL #6   Title Pt will ambulate for 2 minutes with cognitive dual task and with proper sequencing of LBQC in order to indicate safety and efficient community ambulation    Baseline Pt unable to hold conversation and maintain proper cane sequencing of step through gait pattern.    Time 12    Period Weeks    Status New    Target Date 05/30/22                   Plan - 03/09/22 [id number]  Clinical Impression Statement Pt practiced extnesively with quad cane this session. Trialsed other AD but stability was not adequate (hurry cane). With increased practice stability and sequening with quad cane improved significantly but pt still has errors with distracting envoronments (conversations, other patients) as well as turns. Continued to progress with LE strenght and therapeutic. Pt will continue to benefit from skilled PT intervnetions to improve strength, balance, stability and mobility.    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    Examination-Activity Limitations Locomotion Level;Carry;Lift;Hygiene/Grooming;Bend;Reach Overhead;Stairs;Stand;Transfers    Examination-Participation Restrictions Community Activity;Yard Work;Cleaning;Shop    Stability/Clinical Decision Making Evolving/Moderate complexity    Rehab Potential Good    PT Frequency 2x / week    PT Duration 12 weeks    PT Treatment/Interventions ADLs/Self Care Home Management;Aquatic Therapy;Biofeedback;Canalith  Repostioning;Cryotherapy;Electrical Stimulation;Moist Heat;Traction;Ultrasound;DME Instruction;Gait training;Stair training;Functional mobility training;Therapeutic activities;Therapeutic exercise;Balance training;Neuromuscular re-education;Patient/family education;Orthotic Fit/Training;Wheelchair mobility training;Manual techniques;Passive range of motion;Energy conservation;Taping;Vestibular;Visual/perceptual remediation/compensation;Splinting;Spinal Manipulations;Joint Manipulations;Dry needling    PT Next Visit Plan training with quad cane for impoved efficacy    PT Home Exercise Plan Instructed pt to resume previous seated VORx1 HEP (provided when pt in vestibular therapy), provided pt with symptom/activity tracker handout. Will advance HEP;Access Code: [email or web address]; no updates    Consulted and Agree with Plan of Care Patient             Patient will benefit from skilled therapeutic intervention in order to improve the following deficits and impairments:  Abnormal gait, Decreased activity tolerance, Decreased strength, Dizziness, Improper body mechanics, Pain, Decreased balance, Decreased mobility, Difficulty walking, Impaired vision/preception, Postural dysfunction  Visit Diagnosis: Unsteadiness on feet  Other abnormalities of gait and mobility     Problem List Patient Active Problem List   Diagnosis Date Noted   Kidney lesion 12/14/2021   Left hip pain 12/13/2021   Memory change 11/27/2021   Fatigue 07/23/2021   Aortic atherosclerosis ([place]) 04/17/2021   Right acute serous otitis media    Hyponatremia 02/23/2021   Prolonged QT interval 02/23/2021   Dizziness 09/12/2020   Fall 03/08/2020   Fatty liver 12/28/2019   Chest pain 07/25/2019   Frequent urinary tract infections 12/24/2018   UTI (urinary tract infection) 10/11/2018   Hypercalcemia 01/09/2017   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

## 2022-03-09 NOTE — Assessment & Plan Note (Signed)
Persistent issue.  Evaluated by ENT.  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  Recommended vestibular rehab and evaluation by neurology.  Neurology evaluation as outlined.  MRI as outlined.  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 Seeing PT for core strengthening, balance and gait issues. She was referred to the CSF disorder clinic at [facility] for evaluation of possible normal pressure hydrocephalus versus a neurodegenerative condition. A large-volume lumbar puncture was performed on 02/02/22, however only 66m CSF could be drained. 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 CSF biomarkers were negative for AD. It was recommended to proceed with an extended lumbar drain.  Also recommended a DaTscan.  I have placed an order for the scan and we are waiting for this to be scheduled.

## 2022-03-09 NOTE — Progress Notes (Signed)
Cardiology Office Note   Date:  03/09/2022   ID:  Abisola, Carrero 1941-04-23, MRN [id number]  PCP:  Einar Pheasant, MD  Cardiologist:   Kathlyn Sacramento, MD   Chief Complaint  Patient presents with   Other    6 month f/u c/o Fluctuating BP, balance issues/dizziness was told that she has excessive fluid in the brain. Meds reviewed verbally with pt.      History of Present Illness: Crystal Lynch is a 81 y.o. female who presents for  a follow-up visit regarding refractory hypertension. She has no previous cardiac history. Other medical problems include hyperlipidemia and GERD.  Renal artery angiography in October 2016 showed no significant renal artery stenosis.  Unfortunately, she has been having issues with poor balance, memory problems and dizziness.  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  She has been significantly stressed about the situation and has been having intermittent spikes and elevated blood pressure.  No chest pain or shortness of breath.  She was seen by Dr. Nicki Reaper recently who increased the dose of spironolactone and lisinopril.  Her blood pressure seems to be improved today.   Past Medical History:  Diagnosis Date   Arthritis    Chicken pox    Endometriosis    Esophagitis    s/p esophageal dilatation   GERD (gastroesophageal reflux disease)    History of ovarian cyst    Hypercholesterolemia    Hypertension    Hypothyroidism     Past Surgical History:  Procedure Laterality Date   ABDOMINAL SURGERY     found to have an ovarian cyst and endometriosis   APPENDECTOMY       Current Outpatient Medications  Medication Sig Dispense Refill   aspirin 81 MG EC tablet Take 1 tablet (81 mg total) by mouth daily. Swallow whole. 30 tablet 0   Biotin 5 MG CAPS Take 5,000 mcg by mouth daily.     carvedilol (COREG) 6.25 MG tablet Take 1 tablet (6.25 mg total) by mouth 2 (two) times  daily. 180 tablet 0   cholecalciferol (VITAMIN D) 25 MCG (1000 UNIT) tablet Take 1,000 Units by mouth daily.     dorzolamide-timolol (COSOPT) 22.3-6.8 MG/ML ophthalmic solution Place 1 drop into both eyes 2 (two) times daily.     levothyroxine (SYNTHROID) 75 MCG tablet TAKE 1 TABLET BY MOUTH EVERY DAY 90 tablet 1   lisinopril (ZESTRIL) 40 MG tablet TAKE 1 TABLET BY MOUTH EVERY DAY 30 tablet 1   LORazepam (ATIVAN) 0.5 MG tablet Take 1/2 tablet prior to procedure.  May repeat x 1 if needed. 2 tablet 0   Multiple Vitamins-Minerals (ZINC PO) Take 1 tablet by mouth daily.     pantoprazole (PROTONIX) 40 MG tablet TAKE 1 TABLET BY MOUTH EVERY DAY (WAITING ON PA) 90 tablet 1   rosuvastatin (CRESTOR) 10 MG tablet TAKE 1 TABLET BY MOUTH EVERY DAY 90 tablet 1   spironolactone (ALDACTONE) 25 MG tablet Take 1 tablet (25 mg total) by mouth daily. 30 tablet 1   No current facility-administered medications for this visit.    Allergies:   Meloxicam    Social History:  The patient  reports that she has never smoked. She has never used smokeless tobacco. She reports that she does not drink alcohol and does not use drugs.   Family History:  The patient's family history includes Breast cancer (age of onset: 51) in her maternal  aunt; Breast cancer (age of onset: 37) in her sister; Breast cancer (age of onset: 97) in her mother; Heart attack in her father and mother; Heart disease in her mother; Hyperlipidemia in her mother; Hypertension in her brother, mother, and sister; Lung disease in her father; Rheum arthritis in her sister.    ROS:  Please see the history of present illness.   Otherwise, review of systems are positive for none.   All other systems are reviewed and negative.    PHYSICAL EXAM: VS:  BP 114/60 (BP Location: Right Arm, Patient Position: Sitting, Cuff Size: Normal)   Pulse 71   Ht '5\' 2"'$  (1.575 m)   Wt 126 lb 6 oz (57.3 kg)   LMP 10/21/1984   SpO2 98%   BMI 23.11 kg/m  , BMI Body mass  index is 23.11 kg/m. GEN: Well nourished, well developed, in no acute distress  HEENT: normal  Neck: no JVD, carotid bruits, or masses Cardiac: RRR; no murmurs, rubs, or gallops,no edema  Respiratory:  clear to auscultation bilaterally, normal work of breathing GI: soft, nontender, nondistended, + BS MS: no deformity or atrophy  Skin: warm and dry, no rash Neuro:  Strength and sensation are intact Psych: euthymic mood, full affect   EKG:  EKG is ordered today. The ekg ordered today demonstrates normal sinus rhythm with no significant ST or T wave changes.   Recent Labs: 11/14/2021: ALT 20; TSH 2.06 03/03/2022: BUN 18; Creatinine, Ser 0.81; Potassium 3.7; Sodium 137    Lipid Panel    Component Value Date/Time   CHOL 143 11/14/2021 0952   TRIG 154.0 (H) 11/14/2021 0952   HDL 49.10 11/14/2021 0952   CHOLHDL 3 11/14/2021 0952   VLDL 30.8 11/14/2021 0952   LDLCALC 63 11/14/2021 0952   LDLDIRECT 88.0 03/22/2021 1401      Wt Readings from Last 3 Encounters:  03/09/22 126 lb 6 oz (57.3 kg)  03/03/22 127 lb 9.6 oz (57.9 kg)  01/26/22 131 lb 6.4 oz (59.6 kg)           View : No data to display.            ASSESSMENT AND PLAN:   1. Essential hypertension: Recent elevation of blood pressure likely in the setting of increased stress over her neurologic symptoms.  Her blood pressure is well controlled today after recent adjustment in her antihypertensive medication including increasing lisinopril and spironolactone.   Given that blood pressure is well controlled today, no further adjustment is needed at the present time.  I requested a follow-up basic metabolic profile.  2. Hyperlipidemia: She is currently on rosuvastatin 10 mg once daily.  Most recent lipid profile showed an LDL of 63.   Disposition:   FU with me in 6 months  Signed,  Kathlyn Sacramento, MD  03/09/2022 8:51 AM    [facility]

## 2022-03-09 NOTE — Patient Instructions (Signed)
Medication Instructions:  Your physician recommends that you continue on your current medications as directed. Please refer to the Current Medication list given to you today.  *If you need a refill on your cardiac medications before your next appointment, please call your pharmacy*   Lab Work: [facility] today  Please have your lab drawn at the [facility]. Stop at the Registration desk to check in.  If you have labs (blood work) drawn today and your tests are completely normal, you will receive your results only by: [place] (if you have MyChart) OR A paper copy in the mail If you have any lab test that is abnormal or we need to change your treatment, we will call you to review the results.   Testing/Procedures: None ordered   Follow-Up: At [facility], you and your health needs are our priority.  As part of our continuing mission to provide you with exceptional heart care, we have created designated Provider Care Teams.  These Care Teams include your primary Cardiologist (physician) and Advanced Practice Providers (APPs -  Physician Assistants and Nurse Practitioners) who all work together to provide you with the care you need, when you need it.  We recommend signing up for the patient portal called "MyChart".  Sign up information is provided on this After Visit Summary.  MyChart is used to connect with patients for Virtual Visits (Telemedicine).  Patients are able to view lab/test results, encounter notes, upcoming appointments, etc.  Non-urgent messages can be sent to your provider as well.   To learn more about what you can do with MyChart, go to [email or web address].    Your next appointment:   6 month(s)  The format for your next appointment:   In Person  Provider:   You may see Kathlyn Sacramento, MD or one of the following Advanced Practice Providers on your designated Care Team:   Murray Hodgkins, NP Christell Faith, PA-C Cadence Kathlen Mody, [place]   Other  Instructions N/A  Important Information About Sugar

## 2022-03-09 NOTE — Assessment & Plan Note (Signed)
On thyroid replacement.  Follow tsh.  

## 2022-03-09 NOTE — Assessment & Plan Note (Signed)
Found on ultrasound.  Follow liver function tests.   

## 2022-03-09 NOTE — Assessment & Plan Note (Signed)
Seeing neurology.  MRI as outlined.   She was referred to the CSF disorder clinic at [facility] for evaluation of possible normal pressure hydrocephalus versus a neurodegenerative condition. A large-volume lumbar puncture was performed on 02/02/22, however only 15m CSF could be drained. 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 CSF biomarkers were negative for AD. It was recommended to proceed with an extended lumbar drain.  Also recommended a DaTscan.  I have placed an order for the scan and we are waiting for this to be scheduled.

## 2022-03-10 DIAGNOSIS — M25552 Pain in left hip: Secondary | ICD-10-CM | POA: Insufficient documentation

## 2022-03-14 ENCOUNTER — Ambulatory Visit: Payer: Medicare Other

## 2022-03-14 DIAGNOSIS — R42 Dizziness and giddiness: Secondary | ICD-10-CM

## 2022-03-14 DIAGNOSIS — R2681 Unsteadiness on feet: Secondary | ICD-10-CM

## 2022-03-14 DIAGNOSIS — R2689 Other abnormalities of gait and mobility: Secondary | ICD-10-CM

## 2022-03-14 DIAGNOSIS — M6281 Muscle weakness (generalized): Secondary | ICD-10-CM

## 2022-03-14 DIAGNOSIS — R278 Other lack of coordination: Secondary | ICD-10-CM

## 2022-03-14 NOTE — Therapy (Signed)
[place] [facility] SERVICES [address] [place], [place], [address] Phone: [phone]   Fax:  [phone]  Physical Therapy Treatment  Patient Details  Name: Crystal Lynch MRN: [id number] Date of Birth: Feb 17, 1941 Referring Provider (PT): Vladimir Crofts, MD   Encounter Date: 03/14/2022   PT End of Session - 03/14/22 0909     Visit Number 18    Number of Visits 25    Date for PT Re-Evaluation 05/02/22    PT Start Time 0850    PT Stop Time 0928    PT Time Calculation (min) 38 min    Equipment Utilized During Treatment Gait belt    Activity Tolerance Patient tolerated treatment well;No increased pain    Behavior During Therapy WFL for tasks assessed/performed             Past Medical History:  Diagnosis Date   Arthritis    Chicken pox    Endometriosis    Esophagitis    s/p esophageal dilatation   GERD (gastroesophageal reflux disease)    History of ovarian cyst    Hypercholesterolemia    Hypertension    Hypothyroidism     Past Surgical History:  Procedure Laterality Date   ABDOMINAL SURGERY     found to have an ovarian cyst and endometriosis   APPENDECTOMY      There were no vitals filed for this visit.   Subjective Assessment - 03/14/22 0853     Subjective Pt reports no updates today. She saw MD for hip bursitis injection Friday with good relief. Balance is still quite off, moreso today.    Pertinent History Pt presents to PT evaluation ambulating with QC. Pt with c/o imbalance and dizziness that started >4 months ago. The pt is known to this clinic and was previously seen for vestibular therapy. Pt reports vestibular therapy did improve her symptoms some. Pt also recently seen by Speech due to memory changes. Pt seen by ENT and followed by neurologist (Dr. Manuella Ghazi). She reports recent imaging/work-up but that a cause for dizziness and imbalance has not been determined. Pt current concerns and symptoms include:  difficulty with turns, bothered by bright lights, FOF, problems with her vision (has eye appointment next week d/t cataracts, per pt). 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    Patient Stated Goals Pt states, "I would like to be able to jump up out of my chair like I've always done without having to be cautious all the time, without feeling like I'm going to fall." Pt states, "I want my life back."    Currently in Pain? No/denies             INTERVENTIONS     Standing Hip march- 3 lb AW - alt LE x 12 reps  Lateral side step 1x15 bilat c 3lb AW  Standing ham curl with 3lb AW alt LE x 12 reps Standing calf raises 3lb AW - 15 reps-   Seated LAQ 1x15 bilat 3lb AW   -AMB/gait training overground c quad cane cues for 2 point gait sequencing 1x262f  -AMB/gait training overground c quad cane (RUE) + SPC LUE x10107f -AMB/gait training overground c quad cane (LUE) + SPC LUE x10028f-AMB/gait training overground c SPC only in RUE  x144f  -AMB/gait training overground c 4WW x 30106f     PT Education - 03/14/22 0912     Education Details Form for transfers    Person(s) Educated Patient    Methods Explanation    Comprehension Verbalized understanding              PT Short Term Goals - 03/08/22 0808       PT SHORT TERM GOAL #1   Title Pt will be independent with HEP in order to improve strength and balance in order to decrease fall risk and improve function at home and work.    Baseline 2/17: instructed pt to resume her previous seated VORx1 HEP, provided activity/symptom tracker handout 4/7: pt reports completing HEP regularly.    Time 6    Period Weeks    Status Achieved    Target Date 01/20/22       PT SHORT TERM GOAL #2   Title Patient will ambulate for 2 minutes with quad cane with proper sequencing and step through gait pattern in order to demonstrate improved efficacy and safety for community ambulation    Baseline Patient unable to ambulate prolonged distance with proper sequencing.  Patient has to stop and reset several times throughout ambulation trials to sustain proper sequencing.    Time 4    Period Weeks    Status New    Target Date 04/05/22               PT Long Term Goals - 03/07/22 1039       PT LONG TERM GOAL #1   Title Patient will have improved FOTO score of 5 points or greater in order to demonstrate improvements in patient's ADLs and functional performance.    Baseline 2/17: 60% 4/7: 53% 5/16: 50%    Time 12    Period Weeks    Status Not Met    Target Date 05/30/22      PT LONG TERM GOAL #2   Title Pt will improve ABC by at least 13% in order to demonstrate clinically significant improvement in balance confidence.    Baseline 2/17: to be assessed next 1-2 session (was 81% on 06/14/2021); 2/28: 40.3%, 4/7: 41.9% 5/16: 58.125%    Time 12    Period Weeks    Status Achieved    Target Date 05/30/22      PT LONG TERM GOAL #3   Title Patient will demonstrate reduced falls risk as evidenced by Dynamic Gait Index (DGI) 21/24 or greater.    Baseline 2/17: 13/24 (was 23/24 on 06/14/2021) 4/7: 13/24 5/16: 14/24 with SBQC    Time 12    Period Weeks    Status Partially Met    Target Date 05/30/22      PT LONG TERM GOAL #4   Title Patient will have improved gross B lower extremity to at least 4+/5 in order to improve patient's balance and mobility skills.    Baseline 2/17: grossly 4-/5 4/7: 4-/5 for hip flexion, 4 5/16: 4/5 hip flexion, 4+ knee extension, 4/5 knee flexion, 4+ hip abduciton seated, 5/5 hip abduction seated    Time 12    Period Weeks    Status On-going    Target Date 05/30/22      PT LONG TERM GOAL #5   Title Patient will have improved  1028mgait speed to >1 m/s for improved community ambulation and gait ability.    Baseline 2/17: to be assessed next 1-2 sessions; 2/28:  0.83 m/s with QC .55ms with QC 5/16: .71m    Time 12    Period Weeks    Status Not Met    Target Date 05/30/22      Additional Long Term Goals   Additional Long Term Goals Yes      PT LONG TERM GOAL #6   Title Pt will ambulate for 2 minutes with cognitive dual task and with proper sequencing of LBQC in order to indicate safety and efficient community ambulation    Baseline Pt unable to hold conversation and maintain proper cane sequencing of step through gait pattern.    Time 12    Period Weeks    Status New    Target Date 05/30/22                   Plan - 03/14/22 0913     Clinical Impression Statement Balance remains off. Gait improving. SPC sequencing still in need of high volume practice. Educated on different devices for different scenarios. Pt feels SPC less cumbersome in AMB. [id number] for ease in natural trunk rotation in gait.    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    Examination-Activity Limitations Locomotion Level;Carry;Lift;Hygiene/Grooming;Bend;Reach Overhead;Stairs;Stand;Transfers    Examination-Participation Restrictions Community Activity;Yard Work;Cleaning;Shop    Stability/Clinical Decision Making Evolving/Moderate complexity    Clinical Decision Making Moderate    Rehab Potential Good    PT Frequency 2x / week    PT Duration 12 weeks    PT Treatment/Interventions ADLs/Self Care Home Management;Aquatic Therapy;Biofeedback;Canalith  Repostioning;Cryotherapy;Electrical Stimulation;Moist Heat;Traction;Ultrasound;DME Instruction;Gait training;Stair training;Functional mobility training;Therapeutic activities;Therapeutic exercise;Balance training;Neuromuscular re-education;Patient/family education;Orthotic Fit/Training;Wheelchair mobility training;Manual techniques;Passive range of motion;Energy conservation;Taping;Vestibular;Visual/perceptual remediation/compensation;Splinting;Spinal Manipulations;Joint Manipulations;Dry needling    PT Home Exercise Plan Instructed pt to resume previous seated VORx1 HEP (provided when pt in vestibular therapy), provided pt with symptom/activity tracker handout. Will advance HEP;Access Code: [email or web address] updates    Consulted and Agree with Plan of Care Patient             Patient will benefit from skilled therapeutic intervention in order to improve the following deficits and impairments:  Abnormal gait, Decreased activity tolerance, Decreased strength, Dizziness, Improper body mechanics, Pain, Decreased balance, Decreased mobility, Difficulty walking, Impaired vision/preception, Postural dysfunction  Visit Diagnosis: Unsteadiness on feet  Other abnormalities of gait and mobility  Muscle weakness (generalized)  Dizziness and giddiness  Other lack of coordination     Problem List Patient Active Problem List   Diagnosis Date Noted   Kidney lesion 12/14/2021   Left hip pain 12/13/2021   Memory change 11/27/2021   Fatigue 07/23/2021   [place]   Right acute serous otitis media    Hyponatremia 02/23/2021   Prolonged QT interval 02/23/2021   Dizziness 09/12/2020   Fall 03/08/2020   Fatty liver 12/28/2019   Chest pain 07/25/2019   Frequent urinary tract infections 12/24/2018   UTI (urinary tract infection) 10/11/2018   Hypercalcemia 01/09/2017   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 [place], [place], [address] Phone: [phone]   Fax:  [phone]  Name: Crystal Lynch MRN: [id number] Date of Birth: June 26, 1941

## 2022-03-16 ENCOUNTER — Encounter: Payer: Self-pay | Admitting: Internal Medicine

## 2022-03-16 ENCOUNTER — Ambulatory Visit (INDEPENDENT_AMBULATORY_CARE_PROVIDER_SITE_OTHER): Payer: Medicare Other | Admitting: Internal Medicine

## 2022-03-16 ENCOUNTER — Telehealth: Payer: Self-pay

## 2022-03-16 VITALS — BP 140/68 | HR 65 | Temp 98.5°F | Resp 20 | Ht 62.0 in | Wt 126.0 lb

## 2022-03-16 DIAGNOSIS — I700 Atherosclerosis of aorta: Secondary | ICD-10-CM | POA: Diagnosis not present

## 2022-03-16 DIAGNOSIS — R42 Dizziness and giddiness: Secondary | ICD-10-CM

## 2022-03-16 DIAGNOSIS — E7800 Pure hypercholesterolemia, unspecified: Secondary | ICD-10-CM | POA: Diagnosis not present

## 2022-03-16 DIAGNOSIS — K760 Fatty (change of) liver, not elsewhere classified: Secondary | ICD-10-CM

## 2022-03-16 DIAGNOSIS — N289 Disorder of kidney and ureter, unspecified: Secondary | ICD-10-CM

## 2022-03-16 DIAGNOSIS — Z1231 Encounter for screening mammogram for malignant neoplasm of breast: Secondary | ICD-10-CM

## 2022-03-16 DIAGNOSIS — I10 Essential (primary) hypertension: Secondary | ICD-10-CM | POA: Diagnosis not present

## 2022-03-16 DIAGNOSIS — E039 Hypothyroidism, unspecified: Secondary | ICD-10-CM

## 2022-03-16 DIAGNOSIS — K209 Esophagitis, unspecified without bleeding: Secondary | ICD-10-CM

## 2022-03-16 DIAGNOSIS — E871 Hypo-osmolality and hyponatremia: Secondary | ICD-10-CM

## 2022-03-16 DIAGNOSIS — R413 Other amnesia: Secondary | ICD-10-CM

## 2022-03-16 LAB — HEPATIC FUNCTION PANEL
ALT: 25 U/L (ref 0–35)
AST: 14 U/L (ref 0–37)
Albumin: 4.5 g/dL (ref 3.5–5.2)
Alkaline Phosphatase: 64 U/L (ref 39–117)
Bilirubin, Direct: 0.3 mg/dL (ref 0.0–0.3)
Total Bilirubin: 1.7 mg/dL — ABNORMAL HIGH (ref 0.2–1.2)
Total Protein: 6.3 g/dL (ref 6.0–8.3)

## 2022-03-16 LAB — CBC WITH DIFFERENTIAL/PLATELET
Basophils Absolute: 0 10*3/uL (ref 0.0–0.1)
Basophils Relative: 0.1 % (ref 0.0–3.0)
Eosinophils Absolute: 0 10*3/uL (ref 0.0–0.7)
Eosinophils Relative: 0.1 % (ref 0.0–5.0)
HCT: 45.5 % (ref 36.0–46.0)
Hemoglobin: 15.2 g/dL — ABNORMAL HIGH (ref 12.0–15.0)
Lymphocytes Relative: 15.8 % (ref 12.0–46.0)
Lymphs Abs: 1.6 10*3/uL (ref 0.7–4.0)
MCHC: 33.3 g/dL (ref 30.0–36.0)
MCV: 93.7 fl (ref 78.0–100.0)
Monocytes Absolute: 0.8 10*3/uL (ref 0.1–1.0)
Monocytes Relative: 8.5 % (ref 3.0–12.0)
Neutro Abs: 7.5 10*3/uL (ref 1.4–7.7)
Neutrophils Relative %: 75.5 % (ref 43.0–77.0)
Platelets: 277 10*3/uL (ref 150.0–400.0)
RBC: 4.85 Mil/uL (ref 3.87–5.11)
RDW: 13.2 % (ref 11.5–15.5)
WBC: 9.9 10*3/uL (ref 4.0–10.5)

## 2022-03-16 LAB — LIPID PANEL
Cholesterol: 148 mg/dL (ref 0–200)
HDL: 60.3 mg/dL (ref >39.00)
LDL Cholesterol: 66 mg/dL (ref 0–99)
NonHDL: 87.6
Total CHOL/HDL Ratio: 2
Triglycerides: 110 mg/dL (ref 0.0–149.0)
VLDL: 22 mg/dL (ref 0.0–40.0)

## 2022-03-16 LAB — BASIC METABOLIC PANEL
BUN: 23 mg/dL (ref 6–23)
CO2: 29 mEq/L (ref 19–32)
Calcium: 10.8 mg/dL — ABNORMAL HIGH (ref 8.4–10.5)
Chloride: 99 mEq/L (ref 96–112)
Creatinine, Ser: 0.84 mg/dL (ref 0.40–1.20)
GFR: 64.79 mL/min (ref >60.00)
Glucose, Bld: 122 mg/dL — ABNORMAL HIGH (ref 70–99)
Potassium: 4.1 mEq/L (ref 3.5–5.1)
Sodium: 135 mEq/L (ref 135–145)

## 2022-03-16 NOTE — Telephone Encounter (Signed)
Patient's daughter-in-law, Weslyn Holsonback, called to state she just spoke with [facility] and they have not received the order for patient's DAT scan.  Neoma Laming asked that we please resend the fax to them at [phone].  Neoma Laming states [facility]'s phone number is [phone].

## 2022-03-16 NOTE — Progress Notes (Signed)
Patient ID: Crystal Lynch, female   DOB: 30-Jul-1941, 81 y.o.   MRN: [id number]   Subjective:    Patient ID: Crystal Lynch, female    DOB: 15-Jun-1941, 81 y.o.   MRN: [id number]   Patient here for a scheduled follow up.    HPI Here to follow up regarding her blood pressure, dizziness and increased stress.  Reports stress is better.  Feels she is handling things better.  No chest pain or sob.  Still experiencing dizziness.  Some days are better than others.  No headache.  No cough or congestion.  Eating.  No falls.  No abdominal pain.  Bowels moving.  Waiting to be scheduled for DAT scan.    Past Medical History:  Diagnosis Date   Arthritis    Chicken pox    Endometriosis    Esophagitis    s/p esophageal dilatation   GERD (gastroesophageal reflux disease)    History of ovarian cyst    Hypercholesterolemia    Hypertension    Hypothyroidism    Past Surgical History:  Procedure Laterality Date   ABDOMINAL SURGERY     found to have an ovarian cyst and endometriosis   APPENDECTOMY     Family History  Problem Relation Age of Onset   Lung disease Father    Heart attack Father    Heart disease Mother        myocardial infarction   Breast cancer Mother 3   Heart attack Mother    Hypertension Mother    Hyperlipidemia Mother    Breast cancer Sister 19   Hypertension Sister    Rheum arthritis Sister    Breast cancer Maternal Aunt 72   Hypertension Brother    Colon cancer Neg Hx    Social History   Socioeconomic History   Marital status: Married    Spouse name: Not on file   Number of children: Not on file   Years of education: Not on file   Highest education level: Not on file  Occupational History   Not on file  Tobacco Use   Smoking status: Never   Smokeless tobacco: Never  Vaping Use   Vaping Use: Never used  Substance and Sexual Activity   Alcohol use: No    Alcohol/week: 0.0 standard drinks   Drug use: No   Sexual activity: Never  Other Topics Concern    Not on file  Social History Narrative   Not on file   Social Determinants of Health   Financial Resource Strain: Not on file  Food Insecurity: Not on file  Transportation Needs: Not on file  Physical Activity: Not on file  Stress: Not on file  Social Connections: Not on file     Review of Systems  Constitutional:  Negative for unexpected weight change.       Eating.   HENT:  Negative for congestion and sinus pressure.   Respiratory:  Negative for cough, chest tightness and shortness of breath.   Cardiovascular:  Negative for chest pain, palpitations and leg swelling.  Gastrointestinal:  Negative for abdominal pain, diarrhea, nausea and vomiting.  Genitourinary:  Negative for difficulty urinating and dysuria.  Musculoskeletal:  Negative for joint swelling and myalgias.  Skin:  Negative for color change and rash.  Neurological:  Positive for dizziness. Negative for headaches.  Psychiatric/Behavioral:  Negative for agitation.        Increased stress related to her current symptoms.        Objective:  BP 140/68 (BP Location: Left Arm, Patient Position: Sitting, Cuff Size: Small)   Pulse 65   Temp 98.5 F (36.9 C) (Temporal)   Resp 20   Ht '5\' 2"'$  (1.575 m)   Wt 126 lb (57.2 kg)   LMP 10/21/1984   SpO2 99%   BMI 23.05 kg/m  Wt Readings from Last 3 Encounters:  03/16/22 126 lb (57.2 kg)  03/09/22 126 lb 6 oz (57.3 kg)  03/03/22 127 lb 9.6 oz (57.9 kg)    Physical Exam Vitals reviewed.  Constitutional:      General: She is not in acute distress.    Appearance: Normal appearance.  HENT:     Head: Normocephalic and atraumatic.     Right Ear: External ear normal.     Left Ear: External ear normal.  Eyes:     General: No scleral icterus.       Right eye: No discharge.        Left eye: No discharge.     Conjunctiva/sclera: Conjunctivae normal.  Neck:     Thyroid: No thyromegaly.  Cardiovascular:     Rate and Rhythm: Normal rate and regular rhythm.  Pulmonary:      Effort: No respiratory distress.     Breath sounds: Normal breath sounds. No wheezing.  Abdominal:     General: Bowel sounds are normal.     Palpations: Abdomen is soft.     Tenderness: There is no abdominal tenderness.  Musculoskeletal:        General: No swelling or tenderness.     Cervical back: Neck supple. No tenderness.  Lymphadenopathy:     Cervical: No cervical adenopathy.  Skin:    Findings: No erythema or rash.  Neurological:     Mental Status: She is alert.  Psychiatric:        Mood and Affect: Mood normal.        Behavior: Behavior normal.     Outpatient Encounter Medications as of 03/16/2022  Medication Sig   aspirin 81 MG EC tablet Take 1 tablet (81 mg total) by mouth daily. Swallow whole.   Biotin 5 MG CAPS Take 5,000 mcg by mouth daily.   carvedilol (COREG) 6.25 MG tablet Take 1 tablet (6.25 mg total) by mouth 2 (two) times daily.   cholecalciferol (VITAMIN D) 25 MCG (1000 UNIT) tablet Take 1,000 Units by mouth daily.   dorzolamide-timolol (COSOPT) 22.3-6.8 MG/ML ophthalmic solution Place 1 drop into both eyes 2 (two) times daily.   levothyroxine (SYNTHROID) 75 MCG tablet TAKE 1 TABLET BY MOUTH EVERY DAY   LORazepam (ATIVAN) 0.5 MG tablet Take 1/2 tablet prior to procedure.  May repeat x 1 if needed.   Multiple Vitamins-Minerals (ZINC PO) Take 1 tablet by mouth daily.   pantoprazole (PROTONIX) 40 MG tablet TAKE 1 TABLET BY MOUTH EVERY DAY (WAITING ON PA)   rosuvastatin (CRESTOR) 10 MG tablet TAKE 1 TABLET BY MOUTH EVERY DAY   spironolactone (ALDACTONE) 25 MG tablet Take 1 tablet (25 mg total) by mouth daily.   [DISCONTINUED] lisinopril (ZESTRIL) 40 MG tablet TAKE 1 TABLET BY MOUTH EVERY DAY   No facility-administered encounter medications on file as of 03/16/2022.     Lab Results  Component Value Date   WBC 9.9 03/16/2022   HGB 15.2 (H) 03/16/2022   HCT 45.5 03/16/2022   PLT 277.0 03/16/2022   GLUCOSE 122 (H) 03/16/2022   CHOL 148 03/16/2022   TRIG  110.0 03/16/2022   HDL 60.30 03/16/2022  LDLDIRECT 88.0 03/22/2021   LDLCALC 66 03/16/2022   ALT 25 03/16/2022   AST 14 03/16/2022   NA 135 03/16/2022   K 4.1 03/16/2022   CL 99 03/16/2022   CREATININE 0.84 03/16/2022   BUN 23 03/16/2022   CO2 29 03/16/2022   TSH 2.06 11/14/2021   INR 1.0 08/12/2015      Assessment & Plan:   Problem List Items Addressed This Visit     Aortic atherosclerosis ([place])    Continue crestor.         Dizziness    Persistent issue.  Evaluated by ENT.  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  Recommended vestibular rehab and evaluation by neurology.  Neurology evaluation as outlined.  MRI as outlined.  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 Seeing PT for core strengthening, balance and gait issues. She was referred to the CSF disorder clinic at [facility] for evaluation of possible normal pressure hydrocephalus versus a neurodegenerative condition. A large-volume lumbar puncture was performed on 02/02/22, however only 69m CSF could be drained. 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 CSF biomarkers were negative for AD. It was recommended to proceed with an extended lumbar drain.  Also recommended a DaTscan.  I have placed an order for the scan and we are waiting for this to be scheduled.         Esophagitis    Continue protonix.        Fatty liver    Found on ultrasound.  Follow liver function tests.         Hypercholesterolemia    Low cholesterol diet and exercise.  Continue crestor.  Follow lipid panel and liver function tests.         Relevant Orders   Lipid panel (Completed)   Hepatic function panel (Completed)   CBC with Differential/Platelet (Completed)   Hypertension    Blood pressure as outlined.  Continues on coreg, lisinopril '40mg'$  q day and recently adactone increased to '25mg'$  q day.  Will need to monitor sodium and potassium as well as kidney function.   Follow pressures.  Check metabolic panel.        Relevant Orders   Basic metabolic panel (Completed)   Hyponatremia    On aldactone '25mg'$  q day now.  Follow sodium and potassium.        Hypothyroidism    On thyroid replacement.  Follow tsh.        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 However the radiologist commented  that this could be a malignancy, so we will follow-up with his sonogram annually to assess the left kidney mass.  Has seen nephrology.         Memory change    Seeing neurology.  MRI as outlined.   [facility] versus a neurodegenerative condition. A large-volume lumbar puncture was performed on 02/02/22, however only 83mCSF could be drained. 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 CSF biomarkers were negative for AD. It was recommended to proceed with an extended lumbar drain.  Also recommended a DaTscan.  I have placed an order for the scan and we are waiting for this to be  scheduled.         Other Visit Diagnoses     Encounter for screening mammogram for malignant neoplasm of breast    -  Primary   Relevant Orders   MM 3D SCREEN BREAST BILATERAL        Einar Pheasant, MD

## 2022-03-16 NOTE — Telephone Encounter (Signed)
faxed

## 2022-03-17 ENCOUNTER — Other Ambulatory Visit: Payer: Self-pay

## 2022-03-17 ENCOUNTER — Telehealth: Payer: Self-pay | Admitting: Internal Medicine

## 2022-03-17 ENCOUNTER — Other Ambulatory Visit: Payer: Self-pay | Admitting: Internal Medicine

## 2022-03-17 NOTE — Telephone Encounter (Signed)
Thank you :)

## 2022-03-17 NOTE — Telephone Encounter (Signed)
I received a secure chat message from Ms Rotan daughter-n-law requesting the order for the DAT scan to be faxed to [facility].  The message read: "Sorry to bug you again but just wanted to let you know that I reached out to [facility] today about Crystal Lynch's DAT scan and they do not have record of the referral.  I left a message with your clinic to ask them to refax it to [phone]. Their phone number is [phone] if you need it.  I assume your office will handle sending that but just thought I would let you know!" Can you help with this.  [name] is requesting this scan to be done and pt prefers to have done locally.   Thank you for your help.  Dr Nicki Reaper

## 2022-03-21 NOTE — Telephone Encounter (Signed)
Thank you :)

## 2022-03-22 NOTE — Telephone Encounter (Signed)
Thank you very much.  I do appreciate it.

## 2022-03-22 NOTE — Telephone Encounter (Signed)
Sherry from [facility] called she has the pt scheduled for June 7  [phone]

## 2022-03-25 ENCOUNTER — Other Ambulatory Visit: Payer: Self-pay | Admitting: Cardiovascular Disease

## 2022-03-26 ENCOUNTER — Encounter: Payer: Self-pay | Admitting: Internal Medicine

## 2022-03-26 NOTE — Assessment & Plan Note (Signed)
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 However the radiologist commented  that this could be a malignancy, so we will follow-up with his sonogram annually to assess the left kidney mass.  Has seen nephrology.

## 2022-03-26 NOTE — Assessment & Plan Note (Signed)
Continue crestor 

## 2022-03-26 NOTE — Assessment & Plan Note (Signed)
Seeing neurology.  MRI as outlined.   She was referred to the CSF disorder clinic at [facility] for evaluation of possible normal pressure hydrocephalus versus a neurodegenerative condition. A large-volume lumbar puncture was performed on 02/02/22, however only 32m CSF could be drained. 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 CSF biomarkers were negative for AD. It was recommended to proceed with an extended lumbar drain.  Also recommended a DaTscan.  I have placed an order for the scan and we are waiting for this to be scheduled.

## 2022-03-26 NOTE — Assessment & Plan Note (Signed)
Persistent issue.  Evaluated by ENT.  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  Recommended vestibular rehab and evaluation by neurology.  Neurology evaluation as outlined.  MRI as outlined.  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 Seeing PT for core strengthening, balance and gait issues. She was referred to the CSF disorder clinic at [facility] for evaluation of possible normal pressure hydrocephalus versus a neurodegenerative condition. A large-volume lumbar puncture was performed on 02/02/22, however only 64m CSF could be drained. 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 CSF biomarkers were negative for AD. It was recommended to proceed with an extended lumbar drain.  Also recommended a DaTscan.  I have placed an order for the scan and we are waiting for this to be scheduled.

## 2022-03-26 NOTE — Assessment & Plan Note (Signed)
Low cholesterol diet and exercise.  Continue crestor.  Follow lipid panel and liver function tests.   

## 2022-03-26 NOTE — Assessment & Plan Note (Signed)
On aldactone '25mg'$  q day now.  Follow sodium and potassium.

## 2022-03-26 NOTE — Assessment & Plan Note (Signed)
Blood pressure as outlined.  Continues on coreg, lisinopril '40mg'$  q day and recently adactone increased to '25mg'$  q day.  Will need to monitor sodium and potassium as well as kidney function.  Follow pressures.  Check metabolic panel.

## 2022-03-26 NOTE — Assessment & Plan Note (Signed)
On thyroid replacement.  Follow tsh.  

## 2022-03-26 NOTE — Assessment & Plan Note (Signed)
Found on ultrasound.  Follow liver function tests.   

## 2022-03-26 NOTE — Assessment & Plan Note (Signed)
Continue protonix  

## 2022-03-27 ENCOUNTER — Ambulatory Visit: Payer: Medicare Other | Attending: Neurology

## 2022-03-27 DIAGNOSIS — R2681 Unsteadiness on feet: Secondary | ICD-10-CM | POA: Insufficient documentation

## 2022-03-27 DIAGNOSIS — R262 Difficulty in walking, not elsewhere classified: Secondary | ICD-10-CM | POA: Insufficient documentation

## 2022-03-27 DIAGNOSIS — R42 Dizziness and giddiness: Secondary | ICD-10-CM | POA: Insufficient documentation

## 2022-03-27 DIAGNOSIS — R278 Other lack of coordination: Secondary | ICD-10-CM | POA: Insufficient documentation

## 2022-03-27 DIAGNOSIS — M6281 Muscle weakness (generalized): Secondary | ICD-10-CM | POA: Insufficient documentation

## 2022-03-27 DIAGNOSIS — R2689 Other abnormalities of gait and mobility: Secondary | ICD-10-CM | POA: Insufficient documentation

## 2022-03-27 NOTE — Therapy (Signed)
[place] [facility] SERVICES [address] [place], [place], [address] Phone: [phone]   Fax:  [phone]  Physical Therapy Treatment  Patient Details  Name: Crystal Lynch MRN: [id number] Date of Birth: 15-Sep-1941 Referring Provider (PT): Vladimir Crofts, MD   Encounter Date: 03/27/2022   PT End of Session - 03/27/22 0832     Visit Number 19    Number of Visits 25    Date for PT Re-Evaluation 05/02/22    Authorization Type UHC Medicare    Authorization Time Period 03/07/22-05/30/22    Progress Note Due on Visit 20    PT Start Time 0835    PT Stop Time 0920    PT Time Calculation (min) 45 min    Equipment Utilized During Treatment Gait belt    Activity Tolerance Patient tolerated treatment well;No increased pain    Behavior During Therapy WFL for tasks assessed/performed             Past Medical History:  Diagnosis Date   Arthritis    Chicken pox    Endometriosis    Esophagitis    s/p esophageal dilatation   GERD (gastroesophageal reflux disease)    History of ovarian cyst    Hypercholesterolemia    Hypertension    Hypothyroidism     Past Surgical History:  Procedure Laterality Date   ABDOMINAL SURGERY     found to have an ovarian cyst and endometriosis   APPENDECTOMY      There were no vitals filed for this visit.   Subjective Assessment - 03/27/22 0831     Subjective Patient reports more unsteadiness overall and states "Everyday is something different." Patient ambulating with use of quad cane but ambulates very unsteady. Patient reports going to [facility] tomorrow for further follow up- and MRI. Patient reports left hip is feeling much better since injection.    Pertinent History Pt presents to PT evaluation ambulating with QC. Pt with c/o imbalance and dizziness that started >4 months ago. The pt is known to this clinic and was previously seen for vestibular therapy. Pt reports vestibular therapy did improve her  symptoms some. Pt also recently seen by Speech due to memory changes. Pt seen by ENT and followed by neurologist (Dr. Manuella Ghazi). She reports recent imaging/work-up but that a cause for dizziness and imbalance has not been determined. Pt current concerns and symptoms include: difficulty with turns, bothered by bright lights, FOF, problems with her vision (has eye appointment next week d/t cataracts, per pt). 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    Patient Stated Goals Pt states, "I would like to be able to jump up out of my chair like I've always done without having to be cautious all the time, without feeling like I'm going to fall." Pt states, "I want my life back."    Currently in Pain? No/denies                  Interventions:    Standing step tap with BUE Support x 15 reps each LE  Standing lateral step tap with BUE Support x 15 reps each LE Standing Hip ext with 2.5 lb AW BE x 15 reps each Standing Hamstring curl with 2.5 lb AW BLE x 15 reps  Seated  LAQ 2.5 lb AW x 15 reps BLE Seated Hip add with ball squeeze with 3 sec hold x 15 reps. Hip flex/abd up/over hedgehog x 12 reps    Gait with SBQC- 10 MWT= 19.45 sec (unsteady- CGA)  Gait with 4WW - 10 MWT= 16.10 sec (less unsteady)   Education provided throughout session via VC/TC and demonstration to facilitate movement at target joints and correct muscle activation for all testing and exercises performed.                PT Education - 03/27/22 0832     Education Details Exercise technique    Person(s) Educated Patient    Methods Explanation;Demonstration;Tactile cues;Verbal cues    Comprehension Verbalized understanding;Returned  demonstration;Verbal cues required;Tactile cues required;Need further instruction              PT Short Term Goals - 03/08/22 3491       PT SHORT TERM GOAL #1   Title Pt will be independent with HEP in order to improve strength and balance in order to decrease fall risk and improve function at home and work.    Baseline 2/17: instructed pt to resume her previous seated VORx1 HEP, provided activity/symptom tracker handout 4/7: pt reports completing HEP regularly.    Time 6    Period Weeks    Status Achieved    Target Date 01/20/22      PT SHORT TERM GOAL #2   Title Patient will ambulate for 2 minutes with quad cane with proper sequencing and step through gait pattern in order to demonstrate improved efficacy and safety for community ambulation    Baseline Patient unable to ambulate prolonged distance with proper sequencing.  Patient has to stop and reset several times throughout ambulation trials to sustain proper sequencing.    Time 4    Period Weeks    Status New    Target Date 04/05/22               PT Long Term Goals - 03/07/22 1039       PT LONG TERM GOAL #1   Title Patient will have improved FOTO score of 5 points or greater in order to demonstrate improvements in patient's ADLs and functional performance.    Baseline 2/17: 60% 4/7: 53% 5/16: 50%    Time 12    Period Weeks    Status Not Met    Target Date 05/30/22      PT LONG TERM GOAL #2   Title Pt will improve ABC by at least 13% in order to demonstrate clinically significant improvement in balance confidence.    Baseline 2/17: to be assessed next 1-2 session (was 81% on 06/14/2021); 2/28: 40.3%, 4/7: 41.9% 5/16: 58.125%    Time 12    Period Weeks    Status Achieved    Target Date 05/30/22      PT LONG TERM GOAL #3   Title Patient will demonstrate reduced falls risk as evidenced by Dynamic Gait Index (DGI) 21/24 or greater.    Baseline 2/17: 13/24 (was 23/24 on 06/14/2021) 4/7: 13/24 5/16: 14/24 with SBQC     Time 12    Period Weeks    Status Partially Met    Target Date 05/30/22      PT LONG TERM GOAL #4   Title Patient will have improved gross B lower extremity to at least 4+/5 in order to improve patient's balance and mobility skills.    Baseline 2/17: grossly 4-/5 4/7:  4-/5 for hip flexion, 4 5/16: 4/5 hip flexion, 4+ knee extension, 4/5 knee flexion, 4+ hip abduciton seated, 5/5 hip abduction seated    Time 12    Period Weeks    Status On-going    Target Date 05/30/22      PT LONG TERM GOAL #5   Title Patient will have improved 64mt gait speed to >1 m/s for improved community ambulation and gait ability.    Baseline 2/17: to be assessed next 1-2 sessions; 2/28: 0.83 m/s with QC .644m with QC 5/16: .7367m   Time 12    Period Weeks    Status Not Met    Target Date 05/30/22      Additional Long Term Goals   Additional Long Term Goals Yes      PT LONG TERM GOAL #6   Title Pt will ambulate for 2 minutes with cognitive dual task and with proper sequencing of LBQC in order to indicate safety and efficient community ambulation    Baseline Pt unable to hold conversation and maintain proper cane sequencing of step through gait pattern.    Time 12    Period Weeks    Status New    Target Date 05/30/22                   Plan - 03/27/22 [id number]  Clinical Impression Statement Patient presented with no pain during LE strengthening exercises today. She was able to progress to some standing without report of pain today. She did demo some unsteadiness with walking- more with cane vs. Walker today. She will continue to benefit from practice with most appropriate device. Advised at this time that the walker would be most appropriate due to fall risk. Pt will continue to benefit from skilled PT intervnetions to improve strength, balance, stability and mobility.    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    Examination-Activity Limitations Locomotion Level;Carry;Lift;Hygiene/Grooming;Bend;Reach Overhead;Stairs;Stand;Transfers    Examination-Participation Restrictions Community Activity;Yard Work;Cleaning;Shop    Stability/Clinical Decision Making Evolving/Moderate complexity    Rehab Potential Good    PT Frequency 2x / week    PT Duration 12 weeks    PT Treatment/Interventions ADLs/Self Care Home Management;Aquatic Therapy;Biofeedback;Canalith Repostioning;Cryotherapy;Electrical Stimulation;Moist Heat;Traction;Ultrasound;DME Instruction;Gait training;Stair training;Functional mobility training;Therapeutic activities;Therapeutic exercise;Balance training;Neuromuscular re-education;Patient/family education;Orthotic Fit/Training;Wheelchair mobility training;Manual techniques;Passive range of motion;Energy conservation;Taping;Vestibular;Visual/perceptual remediation/compensation;Splinting;Spinal Manipulations;Joint Manipulations;Dry needling    PT Next Visit Plan Contine with mobility training with most appropriate device and continue to progress hip strengthening as appropriate.    PT Home Exercise Plan Instructed pt to resume previous seated VORx1 HEP (provided when pt in vestibular therapy), provided pt with symptom/activity tracker handout. Will advance HEP;Access Code: [email or web address] updates    Consulted and Agree with Plan of Care Patient             Patient will benefit from skilled therapeutic intervention in order to improve the following deficits and impairments:  Abnormal gait, Decreased activity tolerance, Decreased strength, Dizziness, Improper body mechanics, Pain, Decreased balance, Decreased mobility,  Difficulty walking, Impaired vision/preception, Postural dysfunction  Visit Diagnosis: Unsteadiness on feet  Other abnormalities of gait and mobility  Muscle weakness (generalized)  Dizziness and giddiness  Other lack of coordination  Problem List Patient Active Problem List   Diagnosis Date Noted   Kidney lesion 12/14/2021   Left hip pain 12/13/2021   Memory change 11/27/2021   Fatigue 07/23/2021   Aortic atherosclerosis ([place]) 04/17/2021   Right acute serous otitis media    Hyponatremia 02/23/2021   Prolonged QT interval 02/23/2021   Dizziness 09/12/2020   Fall 03/08/2020   Fatty liver 12/28/2019   Chest pain 07/25/2019   Frequent urinary tract infections 12/24/2018   UTI (urinary tract infection) 10/11/2018   Hypercalcemia 01/09/2017   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 [place], [place], [address] Phone: [phone]   Fax:  [phone]  Name: Crystal Lynch MRN: [id number] Date of Birth: 1940/12/08

## 2022-03-29 ENCOUNTER — Ambulatory Visit: Payer: Medicare Other | Admitting: Physical Therapy

## 2022-03-30 ENCOUNTER — Telehealth: Payer: Self-pay

## 2022-03-30 DIAGNOSIS — E2839 Other primary ovarian failure: Secondary | ICD-10-CM

## 2022-03-30 NOTE — Telephone Encounter (Signed)
Patient states Dr. Einar Pheasant was going to order a bone density test for her but she hasn't heard from anyone.  Patient states she would like to have this as soon as possible.

## 2022-03-30 NOTE — Telephone Encounter (Signed)
Order has been placed - waiting on scheduling. LM at [facility] for Shakita/Jamie to cb to sched with me

## 2022-04-02 ENCOUNTER — Other Ambulatory Visit: Payer: Self-pay | Admitting: Internal Medicine

## 2022-04-05 ENCOUNTER — Ambulatory Visit: Payer: Medicare Other

## 2022-04-05 DIAGNOSIS — R2681 Unsteadiness on feet: Secondary | ICD-10-CM | POA: Diagnosis not present

## 2022-04-05 DIAGNOSIS — M6281 Muscle weakness (generalized): Secondary | ICD-10-CM

## 2022-04-05 DIAGNOSIS — R262 Difficulty in walking, not elsewhere classified: Secondary | ICD-10-CM

## 2022-04-05 DIAGNOSIS — R2689 Other abnormalities of gait and mobility: Secondary | ICD-10-CM

## 2022-04-05 DIAGNOSIS — R278 Other lack of coordination: Secondary | ICD-10-CM

## 2022-04-05 NOTE — Therapy (Signed)
[place] MAIN [facility] SERVICES [address] [place], [place], [address] Phone: [phone]   Fax:  [phone]  Physical Therapy Treatment/Physical Therapy Progress Note   Dates of reporting period  01/27/2022  to   04/05/2022  Patient Details  Name: Crystal Lynch MRN: [id number] Date of Birth: 08-Jun-1941 Referring Provider (PT): Vladimir Crofts, MD   Encounter Date: 04/05/2022   PT End of Session - 04/05/22 1303     Visit Number 20    Number of Visits 25    Date for PT Re-Evaluation 05/02/22    Authorization Type UHC Medicare    Authorization Time Period 03/07/22-05/30/22; PN on 04/05/2022    Progress Note Due on Visit 20    PT Start Time 1304    PT Stop Time 1345    PT Time Calculation (min) 41 min    Equipment Utilized During Treatment Gait belt    Activity Tolerance Patient tolerated treatment well;No increased pain    Behavior During Therapy WFL for tasks assessed/performed             Past Medical History:  Diagnosis Date   Arthritis    Chicken pox    Endometriosis    Esophagitis    s/p esophageal dilatation   GERD (gastroesophageal reflux disease)    History of ovarian cyst    Hypercholesterolemia    Hypertension    Hypothyroidism     Past Surgical History:  Procedure Laterality Date   ABDOMINAL SURGERY     found to have an ovarian cyst and endometriosis   APPENDECTOMY      There were no vitals filed for this visit.   Subjective Assessment - 04/05/22 1308     Subjective Patient reports no new changes or concerns. Patient reports went to [facility] for further follow up- and MRI, and reports no new findings. No stumbles/falls.    Pertinent History Pt presents to PT evaluation ambulating with QC. Pt with c/o imbalance and dizziness that started >4 months ago. The pt is known to this clinic and was previously seen for vestibular therapy. Pt reports vestibular therapy did improve her symptoms some. Pt also recently seen by  Speech due to memory changes. Pt seen by ENT and followed by neurologist (Dr. Manuella Ghazi). She reports recent imaging/work-up but that a cause for dizziness and imbalance has not been determined. Pt current concerns and symptoms include: difficulty with turns, bothered by bright lights, FOF, problems with her vision (has eye appointment next week d/t cataracts, per pt). 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    Patient Stated Goals Pt states, "I would like to be able to jump up out of my chair like I've always done without having to be cautious all the time, without feeling like I'm going to fall." Pt states, "I want my life back."    Currently in Pain? No/denies                [facility] PT Assessment - 04/05/22 0001       Dynamic Gait Index   Level Surface Mild Impairment    Change in Gait Speed Mild Impairment    Gait with Horizontal Head Turns Mild Impairment    Gait with Vertical Head Turns Mild Impairment    Gait and  Pivot Turn Mild Impairment    Step Over Obstacle Severe Impairment    Step Around Obstacles Mild Impairment    Steps Moderate Impairment    Total Score 13               INTERVENTIONS    Reassessed all Long term goals for progress note (Visit #20):  LE MMT: grossly 4+/5 B  FOTO: 50%  Dynamic Gait Index: 13/24  10 Meter walk test: 0.77 m/s  2 min walk with cognitive dual task: Pt unable to maintain proper cane sequencing while holding conversation for 3 mins.  ABC: 50.625%  Functional outcome testing performed by Izola Price, SPT- Supervised by Ollen Bowl, PT    PT Education - 04/05/22 1352     Education Details POC, outcome measure instructions and results     Person(s) Educated Patient    Methods Explanation;Demonstration    Comprehension Verbalized understanding;Returned demonstration;Verbal cues required              PT Short Term Goals - 04/05/22 1349       PT SHORT TERM GOAL #1   Title Pt will be independent with HEP in order to improve strength and balance in order to decrease fall risk and improve function at home and work.    Baseline 2/17: instructed pt to resume her previous seated VORx1 HEP, provided activity/symptom tracker handout 4/7: pt reports completing HEP regularly.    Time 6    Period Weeks    Status Achieved    Target Date 01/20/22      PT SHORT TERM GOAL #2   Title Patient will ambulate for 2 minutes with quad cane with proper sequencing and step through gait pattern in order to demonstrate improved efficacy and safety for community ambulation    Baseline Patient unable to ambulate prolonged distance with proper sequencing.  Patient has to stop and reset several times throughout ambulation trials to sustain proper sequencing. 6/14: Pt unable to maintain proper cane sequencing while holding conversation for 3 mins.    Time 4    Period Weeks    Status Not Met    Target Date 04/05/22               PT Long Term Goals - 04/05/22 1312       PT LONG TERM GOAL #1   Title Patient will have improved FOTO score of 5 points or greater in order to demonstrate improvements in patient's ADLs and functional performance.    Baseline 2/17: 60% 4/7: 53% 5/16: 50% 6/14: 50%    Time 12    Period Weeks    Status Not Met    Target Date 05/30/22      PT LONG TERM GOAL #2   Title Pt will improve ABC by at least 13% in order to demonstrate clinically significant improvement in balance confidence.    Baseline 2/17: to be assessed next 1-2 session (was 81% on 06/14/2021); 2/28: 40.3%, 4/7: 41.9% 5/16: 58.125% 6/14: 50.625%    Time 12    Period Weeks    Status Achieved    Target Date 05/30/22      PT LONG TERM GOAL #3    Title Patient will demonstrate reduced falls risk as evidenced by Dynamic Gait Index (DGI) 21/24 or greater.    Baseline 2/17: 13/24 (was 23/24 on 06/14/2021) 4/7: 13/24 5/16: 14/24 with SBQC 6/14: 13/24 with SBQC    Time 12    Period Weeks  Status Partially Met    Target Date 05/30/22      PT LONG TERM GOAL #4   Title Patient will have improved gross B lower extremity to at least 4+/5 in order to improve patient's balance and mobility skills.    Baseline 2/17: grossly 4-/5 4/7: 4-/5 for hip flexion, 4 5/16: 4/5 hip flexion, 4+ knee extension, 4/5 knee flexion, 4+ hip abduciton seated, 5/5 hip abduction seated 6/14: grossly 4+/5 B    Time 12    Period Weeks    Status On-going    Target Date 05/30/22      PT LONG TERM GOAL #5   Title Patient will have improved 64mt gait speed to >1 m/s for improved community ambulation and gait ability.    Baseline 2/17: to be assessed next 1-2 sessions; 2/28: 0.83 m/s with QC .644m with QC 5/16: .7367m6/14: 0.77 m/s    Time 12    Period Weeks    Status Not Met    Target Date 05/30/22      PT LONG TERM GOAL #6   Title Pt will ambulate for 2 minutes with cognitive dual task and with proper sequencing of LBQC in order to indicate safety and efficient community ambulation    Baseline Pt unable to hold conversation and maintain proper cane sequencing of step through gait pattern. 6/14: Pt unable to maintain proper cane sequencing while holding conversation for 3 mins.    Time 12    Period Weeks    Status New    Target Date 05/30/22                   Plan - 04/05/22 1356     Clinical Impression Statement Pt was pleasant and motivated for progress note today. Pt continues to make gains in strength in her BLE per MMT. The pt still presents with balance deficits similar to last progress note per results of DGI, ABC scales, and 10MWT. Pt will benefit from further skiller PT to address balance deficits, improve BLE strength, decresae fall risk,  and increase overall independence and QoL.    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    Examination-Activity Limitations Locomotion Level;Carry;Lift;Hygiene/Grooming;Bend;Reach Overhead;Stairs;Stand;Transfers    Examination-Participation Restrictions Community Activity;Yard Work;Cleaning;Shop    Stability/Clinical Decision Making Evolving/Moderate complexity    Rehab Potential Good    PT Frequency 2x / week    PT Duration 12 weeks    PT Treatment/Interventions ADLs/Self Care Home Management;Aquatic Therapy;Biofeedback;Canalith Repostioning;Cryotherapy;Electrical Stimulation;Moist Heat;Traction;Ultrasound;DME Instruction;Gait training;Stair training;Functional mobility training;Therapeutic activities;Therapeutic exercise;Balance training;Neuromuscular re-education;Patient/family education;Orthotic Fit/Training;Wheelchair mobility training;Manual techniques;Passive range of motion;Energy conservation;Taping;Vestibular;Visual/perceptual remediation/compensation;Splinting;Spinal Manipulations;Joint Manipulations;Dry needling    PT Next Visit Plan BLE strengthening, balance, gait training, continue with POC    PT Home Exercise Plan Instructed pt to resume previous seated VORx1 HEP (provided when pt in vestibular therapy), provided pt with symptom/activity tracker handout. Will advance HEP;Access Code: [email or web address] updates    Consulted and Agree with Plan of Care Patient             Patient will benefit from skilled therapeutic intervention in order to improve the following deficits and impairments:  Abnormal gait, Decreased activity  tolerance, Decreased strength, Dizziness, Improper body mechanics, Pain, Decreased balance,  Decreased mobility, Difficulty walking, Impaired vision/preception, Postural dysfunction  Visit Diagnosis: Unsteadiness on feet  Other abnormalities of gait and mobility  Muscle weakness (generalized)  Difficulty in walking, not elsewhere classified  Other lack of coordination     Problem List Patient Active Problem List   Diagnosis Date Noted   Kidney lesion 12/14/2021   Left hip pain 12/13/2021   Memory change 11/27/2021   Fatigue 07/23/2021   Aortic atherosclerosis ([place]) 04/17/2021   Right acute serous otitis media    Hyponatremia 02/23/2021   Prolonged QT interval 02/23/2021   Dizziness 09/12/2020   Fall 03/08/2020   Fatty liver 12/28/2019   Chest pain 07/25/2019   Frequent urinary tract infections 12/24/2018   UTI (urinary tract infection) 10/11/2018   Hypercalcemia 01/09/2017   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

## 2022-04-07 ENCOUNTER — Other Ambulatory Visit (INDEPENDENT_AMBULATORY_CARE_PROVIDER_SITE_OTHER): Payer: Medicare Other

## 2022-04-07 ENCOUNTER — Other Ambulatory Visit: Payer: Self-pay

## 2022-04-07 DIAGNOSIS — E871 Hypo-osmolality and hyponatremia: Secondary | ICD-10-CM

## 2022-04-07 LAB — BASIC METABOLIC PANEL
BUN: 12 mg/dL (ref 6–23)
CO2: 27 mEq/L (ref 19–32)
Calcium: 10 mg/dL (ref 8.4–10.5)
Chloride: 96 mEq/L (ref 96–112)
Creatinine, Ser: 0.82 mg/dL (ref 0.40–1.20)
GFR: 66.66 mL/min (ref >60.00)
Glucose, Bld: 114 mg/dL — ABNORMAL HIGH (ref 70–99)
Potassium: 4.1 mEq/L (ref 3.5–5.1)
Sodium: 128 mEq/L — ABNORMAL LOW (ref 135–145)

## 2022-04-10 ENCOUNTER — Other Ambulatory Visit (INDEPENDENT_AMBULATORY_CARE_PROVIDER_SITE_OTHER): Payer: Medicare Other

## 2022-04-10 DIAGNOSIS — E871 Hypo-osmolality and hyponatremia: Secondary | ICD-10-CM | POA: Diagnosis not present

## 2022-04-10 LAB — SODIUM: Sodium: 136 meq/L (ref 135–145)

## 2022-04-10 NOTE — Telephone Encounter (Signed)
Bone desity sched for 8/17 at 140 - pt advised

## 2022-04-11 ENCOUNTER — Other Ambulatory Visit: Payer: Self-pay | Admitting: Internal Medicine

## 2022-04-13 ENCOUNTER — Ambulatory Visit: Payer: Medicare Other | Admitting: Physical Therapy

## 2022-04-13 DIAGNOSIS — R2681 Unsteadiness on feet: Secondary | ICD-10-CM

## 2022-04-13 DIAGNOSIS — M6281 Muscle weakness (generalized): Secondary | ICD-10-CM

## 2022-04-13 DIAGNOSIS — R2689 Other abnormalities of gait and mobility: Secondary | ICD-10-CM

## 2022-04-13 DIAGNOSIS — R262 Difficulty in walking, not elsewhere classified: Secondary | ICD-10-CM

## 2022-04-13 NOTE — Therapy (Signed)
OUTPATIENT PHYSICAL THERAPY TREATMENT NOTE   Patient Name: Crystal Lynch MRN: [id number] DOB:[birth year], 81 y.o., female Today's Date: 04/13/2022  PCP: Einar Pheasant, MD REFERRING PROVIDER:Shah, Royetta Crochet, MD   PT End of Session - 04/13/22 0926     Visit Number 21    Number of Visits 25    Date for PT Re-Evaluation 05/02/22    Authorization Type [employer] Medicare    Authorization Time Period 03/07/22-05/30/22; PN on 04/05/2022    Progress Note Due on Visit 30    PT Start Time 0848    PT Stop Time 0915    PT Time Calculation (min) 27 min    Equipment Utilized During Treatment Gait belt    Activity Tolerance Patient tolerated treatment well;No increased pain    Behavior During Therapy WFL for tasks assessed/performed             Past Medical History:  Diagnosis Date   Arthritis    Chicken pox    Endometriosis    Esophagitis    s/p esophageal dilatation   GERD (gastroesophageal reflux disease)    History of ovarian cyst    Hypercholesterolemia    Hypertension    Hypothyroidism    Past Surgical History:  Procedure Laterality Date   ABDOMINAL SURGERY     found to have an ovarian cyst and endometriosis   APPENDECTOMY     Patient Active Problem List   Diagnosis Date Noted   Kidney lesion 12/14/2021   Left hip pain 12/13/2021   Memory change 11/27/2021   Fatigue 07/23/2021   Aortic atherosclerosis ([place]) 04/17/2021   Right acute serous otitis media    Hyponatremia 02/23/2021   Prolonged QT interval 02/23/2021   Dizziness 09/12/2020   Fall 03/08/2020   Fatty liver 12/28/2019   Chest pain 07/25/2019   Frequent urinary tract infections 12/24/2018   UTI (urinary tract infection) 10/11/2018   Hypercalcemia 01/09/2017   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 Pt with c/o imbalance and dizziness that started >4 months ago. The pt is known to this clinic and was previously seen for vestibular therapy. Pt reports vestibular therapy did improve her symptoms some. Pt also recently seen by Speech due to memory changes. Pt seen by ENT and followed by neurologist (Dr. Manuella Ghazi). She reports recent imaging/work-up but that a cause for dizziness and imbalance has not been determined. Pt current concerns and symptoms include: difficulty with turns, bothered by bright lights, FOF, problems with her vision (has eye appointment next week d/t cataracts, per pt). 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   PRECAUTIONS: Fall  SUBJECTIVE: Pt reports no falls or LOB since last session. Pt reports she would like to break from PT until she has her visit at [facility] for further workup regarding  her balance and instability.   PAIN:  Are you having pain? No     TODAY'S TREATMENT:  Exercises - Seated Gaze Stabilization with Head Rotation  - 1 x daily - 7 x weekly - 3 sets - 3 reps - 60 hold - Sit to Stand with Counter Support  - 1 x daily - 7 x weekly - 2 sets - 10 reps - Sitting Knee Extension with Resistance  - 1 x daily - 7 x weekly - 2 sets - 10 reps - Seated Hip Abduction with  Resistance  - 1 x daily - 7 x weekly - 2 sets - 10 reps - Heel Toe Raises with Counter Support  - 1 x daily - 7 x weekly - 2 sets - 15 reps - Standing March with Counter Support  - 1 x daily - 7 x weekly - 2 sets - 10 reps   PATIENT EDUCATION: Education details: HEP Person educated: Patient Education method: Explanation Education comprehension: verbalized understanding   HOME EXERCISE PROGRAM: Access Code: [email or web address] URL: [email or web address] Date: 04/13/2022 Prepared by: Rivka Barbara   Exercises - Seated Gaze Stabilization with Head Rotation  - 1 x daily - 7 x weekly - 3 sets - 3 reps - 60 hold - Sit to Stand with Counter Support  - 1 x daily - 7 x weekly - 2 sets - 10 reps - Sitting Knee Extension with Resistance  - 1 x daily - 7 x weekly - 2 sets - 10 reps - Seated Hip Abduction with Resistance  - 1 x daily - 7 x weekly - 2 sets - 10 reps - Heel Toe Raises with Counter Support  - 1 x daily - 7 x weekly - 2 sets - 15 reps - Standing March with Counter Support  - 1 x daily - 7 x weekly - 2 sets - 10 reps   PT Short Term Goals -       PT SHORT TERM GOAL #1   Title Pt will be independent with HEP in order to improve strength and balance in order to decrease fall risk and improve function at home and work.    Baseline 2/17: instructed pt to resume her previous seated VORx1 HEP, provided activity/symptom tracker handout 4/7: pt reports completing HEP regularly.    Time 6    Period Weeks    Status Achieved    Target Date 01/20/22      PT SHORT TERM GOAL #2   Title Patient will ambulate for 2 minutes with quad cane with proper sequencing and step through gait pattern in order to demonstrate improved efficacy and safety for community ambulation    Baseline Patient unable to ambulate prolonged distance with proper sequencing.  Patient has to stop and reset several times throughout ambulation trials to sustain proper sequencing. 6/14: Pt unable to maintain proper cane  sequencing while holding conversation for 3 mins.    Time 4    Period Weeks    Status Not Met    Target Date 05/03/22              PT Long Term Goals -      PT LONG TERM GOAL #1   Title Patient will have improved FOTO score of 5 points or greater in order to demonstrate improvements in patient's ADLs and functional performance.    Baseline 2/17: 60% 4/7: 53% 5/16: 50% 6/14: 50%    Time 12    Period Weeks    Status  Not Met    Target Date 05/30/22      PT LONG TERM GOAL #2   Title Pt will improve ABC by at least 13% in order to demonstrate clinically significant improvement in balance confidence.    Baseline 2/17: to be assessed next 1-2 session (was 81% on 06/14/2021); 2/28: 40.3%, 4/7: 41.9% 5/16: 58.125% 6/14: 50.625%    Time 12    Period Weeks    Status Achieved    Target Date 05/30/22      PT LONG TERM GOAL #3   Title Patient will demonstrate reduced falls risk as evidenced by Dynamic Gait Index (DGI) 21/24 or greater.    Baseline 2/17: 13/24 (was 23/24 on 06/14/2021) 4/7: 13/24 5/16: 14/24 with SBQC 6/14: 13/24 with SBQC    Time 12    Period Weeks    Status Partially Met    Target Date 05/30/22      PT LONG TERM GOAL #4   Title Patient will have improved gross B lower extremity to at least 4+/5 in order to improve patient's balance and mobility skills.    Baseline 2/17: grossly 4-/5 4/7: 4-/5 for hip flexion, 4 5/16: 4/5 hip flexion, 4+ knee extension, 4/5 knee flexion, 4+ hip abduciton seated, 5/5 hip abduction seated 6/14: grossly 4+/5 B    Time 12    Period Weeks    Status On-going    Target Date 05/30/22      PT LONG TERM GOAL #5   Title Patient will have improved 13mt gait speed to >1 m/s for improved community ambulation and gait ability.    Baseline 2/17: to be assessed next 1-2 sessions; 2/28: 0.83 m/s with QC .634m with QC 5/16: .7332m6/14: 0.77 m/s    Time 12    Period Weeks    Status Not Met    Target Date 05/30/22      PT LONG TERM GOAL #6    Title Pt will ambulate for 2 minutes with cognitive dual task and with proper sequencing of LBQC in order to indicate safety and efficient community ambulation    Baseline Pt unable to hold conversation and maintain proper cane sequencing of step through gait pattern. 6/14: Pt unable to maintain proper cane sequencing while holding conversation for 3 mins.    Time 12    Period Weeks    Status New    Target Date 05/30/22              Plan -     Clinical Impression Statement Patient presents to physical therapy with good motivation for physical therapy interventions.  Patient and therapist discuss holding physical therapy treatment [facility]  Physical therapist agreeable to this and updated and reviewed patient's home exercise program to ensure patient continue to maintain progress she is made in therapy to this point.  Patient frustrates a good form and control all activities performed and all questions answered regarding, exercise program and future physical [facility] benefit with skilled physical therapy at that time but all fours will be held until then.   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  Examination-Activity Limitations Locomotion Level;Carry;Lift;Hygiene/Grooming;Bend;Reach Overhead;Stairs;Stand;Transfers    Examination-Participation Restrictions Community Activity;Yard Work;Cleaning;Shop    Stability/Clinical Decision Making Evolving/Moderate complexity    Rehab Potential Good    PT Frequency 2x / week     PT Duration 12 weeks    PT Treatment/Interventions ADLs/Self Care Home Management;Aquatic Therapy;Biofeedback;Canalith Repostioning;Cryotherapy;Electrical Stimulation;Moist Heat;Traction;Ultrasound;DME Instruction;Gait training;Stair training;Functional mobility training;Therapeutic activities;Therapeutic exercise;Balance training;Neuromuscular re-education;Patient/family education;Orthotic Fit/Training;Wheelchair mobility training;Manual techniques;Passive range of motion;Energy conservation;Taping;Vestibular;Visual/perceptual remediation/compensation;Splinting;Spinal Manipulations;Joint Manipulations;Dry needling    PT Next Visit Plan [facility] and any progress made since then.   PT Home Exercise Plan Instructed pt to resume previous seated VORx1 HEP (provided when pt in vestibular therapy), provided pt with symptom/activity tracker handout. Will advance HEP;Access Code: [email or web address]; no updates    Consulted and Agree with Plan of Care Patient               Particia Lather, PT 04/13/2022, 11:20 AM

## 2022-04-13 NOTE — Patient Instructions (Signed)
Access Code: [email or web address] URL: [email or web address] Date: 04/13/2022 Prepared by: Rivka Barbara  Exercises - Seated Gaze Stabilization with Head Rotation  - 1 x daily - 7 x weekly - 3 sets - 3 reps - 60 hold - Sit to Stand with Counter Support  - 1 x daily - 7 x weekly - 2 sets - 10 reps - Sitting Knee Extension with Resistance  - 1 x daily - 7 x weekly - 2 sets - 10 reps - Seated Hip Abduction with Resistance  - 1 x daily - 7 x weekly - 2 sets - 10 reps - Heel Toe Raises with Counter Support  - 1 x daily - 7 x weekly - 2 sets - 15 reps - Standing March with Counter Support  - 1 x daily - 7 x weekly - 2 sets - 10 reps

## 2022-04-17 ENCOUNTER — Ambulatory Visit: Payer: Medicare Other

## 2022-04-19 ENCOUNTER — Ambulatory Visit: Payer: Medicare Other

## 2022-04-24 ENCOUNTER — Ambulatory Visit: Payer: Medicare Other

## 2022-04-27 ENCOUNTER — Other Ambulatory Visit: Payer: Self-pay | Admitting: Internal Medicine

## 2022-04-27 ENCOUNTER — Ambulatory Visit: Payer: Medicare Other

## 2022-04-28 ENCOUNTER — Ambulatory Visit (INDEPENDENT_AMBULATORY_CARE_PROVIDER_SITE_OTHER): Payer: Medicare Other

## 2022-04-28 VITALS — Ht 62.0 in | Wt 126.0 lb

## 2022-04-28 DIAGNOSIS — Z0000 Encounter for general adult medical examination without abnormal findings: Secondary | ICD-10-CM

## 2022-04-28 NOTE — Patient Instructions (Addendum)
  Crystal Lynch , Thank you for taking time to come for your Medicare Wellness Visit. I appreciate your ongoing commitment to your health goals. Please review the following plan we discussed and let me know if I can assist you in the future.   These are the goals we discussed:  Goals       Patient Stated     continue balance exercises (pt-stated)      Core/strengthening exercises Stretching        This is a list of the screening recommended for you and due dates:  Health Maintenance  Topic Date Due   Mammogram  03/14/2022   COVID-19 Vaccine (4 - Pfizer series) 05/14/2022*   Flu Shot  05/23/2022   Tetanus Vaccine  03/03/2030   Pneumonia Vaccine  Completed   DEXA scan (bone density measurement)  Completed   Zoster (Shingles) Vaccine  Completed   HPV Vaccine  Aged Out  *Topic was postponed. The date shown is not the original due date.

## 2022-04-28 NOTE — Progress Notes (Signed)
Subjective:   Crystal Lynch is a 81 y.o. female who presents for Medicare Annual (Subsequent) preventive examination.  Review of Systems    No ROS.  Medicare Wellness Virtual Visit.  Visual/audio telehealth visit, UTA vital signs.   See social history for additional risk factors.   Cardiac Risk Factors include: advanced age (>59mn, >>2women);hypertension     Objective:    Today's Vitals   04/28/22 0904  Weight: 126 lb (57.2 kg)  Height: '5\' 2"'$  (1.575 m)   Body mass index is 23.05 kg/m.     04/28/2022    9:12 AM 11/24/2021    2:08 PM 03/15/2021   10:06 AM 02/24/2021    1:20 AM 02/23/2021    5:01 PM 03/05/2020    9:56 AM 03/03/2019   11:12 AM  Advanced Directives  Does Patient Have a Medical Advance Directive? Yes Yes Yes Yes Yes Yes Yes  Type of AParamedicof [place]Com[place]Living will  [place] will [place]Living will [place] HornLiving will HD[place]Living will   Does patient want to make changes to medical advance directive? No - Patient declined No - Patient declined No - Patient declined No - Patient declined  No - Patient declined No - Patient declined  Copy of [place] Chart? No - copy requested  No - copy requested No - copy requested  No - copy requested     Current Medications (verified) Outpatient Encounter Medications as of 04/28/2022  Medication Sig   aspirin 81 MG EC tablet Take 1 tablet (81 mg total) by mouth daily. Swallow whole.   Biotin 5 MG CAPS Take 5,000 mcg by mouth daily.   carvedilol (COREG) 6.25 MG tablet Take 1 tablet (6.25 mg total) by mouth 2 (two) times daily.   cholecalciferol (VITAMIN D) 25 MCG (1000 UNIT) tablet Take 1,000 Units by mouth daily.   dorzolamide-timolol (COSOPT) 22.3-6.8 MG/ML ophthalmic solution Place 1 drop into both eyes 2 (two) times daily.   levothyroxine (SYNTHROID) 75 MCG tablet TAKE 1 TABLET BY MOUTH  EVERY DAY   lisinopril (ZESTRIL) 40 MG tablet TAKE 1 TABLET BY MOUTH EVERY DAY   LORazepam (ATIVAN) 0.5 MG tablet Take 1/2 tablet prior to procedure.  May repeat x 1 if needed.   Multiple Vitamins-Minerals (ZINC PO) Take 1 tablet by mouth daily.   pantoprazole (PROTONIX) 40 MG tablet TAKE 1 TABLET BY MOUTH EVERY DAY (WAITING ON PA)   rosuvastatin (CRESTOR) 10 MG tablet TAKE 1 TABLET BY MOUTH EVERY DAY   spironolactone (ALDACTONE) 25 MG tablet TAKE 1 TABLET (25 MG TOTAL) BY MOUTH DAILY.   No facility-administered encounter medications on file as of 04/28/2022.    Allergies (verified) Meloxicam   History: Past Medical History:  Diagnosis Date   Arthritis    Chicken pox    Endometriosis    Esophagitis    s/p esophageal dilatation   GERD (gastroesophageal reflux disease)    History of ovarian cyst    Hypercholesterolemia    Hypertension    Hypothyroidism    Past Surgical History:  Procedure Laterality Date   ABDOMINAL SURGERY     found to have an ovarian cyst and endometriosis   APPENDECTOMY     Family History  Problem Relation Age of Onset   Lung disease Father    Heart attack Father    Heart disease Mother        myocardial infarction  Breast cancer Mother 21   Heart attack Mother    Hypertension Mother    Hyperlipidemia Mother    Breast cancer Sister 61   Hypertension Sister    Rheum arthritis Sister    Breast cancer Maternal Aunt 76   Hypertension Brother    Colon cancer Neg Hx    Social History   Socioeconomic History   Marital status: Married    Spouse name: Not on file   Number of children: Not on file   Years of education: Not on file   Highest education level: Not on file  Occupational History   Not on file  Tobacco Use   Smoking status: Never   Smokeless tobacco: Never  Vaping Use   Vaping Use: Never used  Substance and Sexual Activity   Alcohol use: No    Alcohol/week: 0.0 standard drinks of alcohol   Drug use: No   Sexual activity: Never   Other Topics Concern   Not on file  Social History Narrative   Not on file   Social Determinants of Health   Financial Resource Strain: Low Risk  (04/28/2022)   Overall Financial Resource Strain (CARDIA)    Difficulty of Paying Living Expenses: Not hard at all  Food Insecurity: No Food Insecurity (04/28/2022)   Hunger Vital Sign    Worried About Running Out of Food in the Last Year: Never true    Crystal Lynch in the Last Year: Never true  Transportation Needs: No Transportation Needs (04/28/2022)   PRAPARE - Hydrologist (Medical): No    Lack of Transportation (Non-Medical): No  Physical Activity: Insufficiently Active (04/28/2022)   Exercise Vital Sign    Days of Exercise per Week: 7 days    Minutes of Exercise per Session: 10 min  Stress: No Stress Concern Present (04/28/2022)   [place]    Feeling of Stress : Not at all  Social Connections: Unknown (04/28/2022)   Social Connection and Isolation Panel [NHANES]    Frequency of Communication with Friends and Family: More than three times a week    Frequency of Social Gatherings with Friends and Family: More than three times a week    Attends Religious Services: More than 4 times per year    Active Member of [employer] or Organizations: Not on file    Attends Archivist Meetings: Not on file    Marital Status: Married    Tobacco Counseling Counseling given: Not Answered   Clinical Intake:  Pre-visit preparation completed: Yes        Diabetes: No  How often do you need to have someone help you when you read instructions, pamphlets, or other written materials from your doctor or pharmacy?: 1 - Never   Interpreter Needed?: No      Activities of Daily Living    04/28/2022    9:14 AM  In your present state of health, do you have any difficulty performing the following activities:  Hearing? 0  Vision? 0  Difficulty  concentrating or making decisions? 0  Walking or climbing stairs? 1  Comment Cane in use when ambulating. Paces self.  Dressing or bathing? 0  Doing errands, shopping? 0  Preparing Food and eating ? N  Using the Toilet? N  In the past six months, have you accidently leaked urine? N  Do you have problems with loss of bowel control? N  Managing your Medications? N  Managing your Finances? N  Housekeeping or managing your Housekeeping? Y  Comment Maid assist    Patient Care Team: Einar Pheasant, MD as PCP - General (Internal Medicine) Wellington Hampshire, MD as PCP - Cardiology (Cardiology)  Indicate any recent Medical Services you may have received from other than [facility] providers in the past year (date may be approximate).     Assessment:   This is a routine wellness examination for [place].  Virtual Visit via Telephone Note  I connected with  [name] on 04/28/22 at  9:00 AM EDT by telephone and verified that I am speaking with the correct person using two identifiers.  Persons participating in the virtual visit: patient/Nurse Health Advisor   I discussed the limitations of performing an evaluation and management service by telehealth. We continued and completed visit with audio only. Some vital signs may be absent or patient reported.   Hearing/Vision screen Hearing Screening - Comments:: Patient is able to hear conversational tones without difficulty.  No issues reported. Dr. Con Memos every 6 months for cleaning  Vision Screening - Comments:: Followed by [facility]  Wears corrective lenses They have regular follow up with the ophthalmologist  Dietary issues and exercise activities discussed: Current Exercise Habits: Home exercise routine, Type of exercise: stretching, Time (Minutes): 10, Frequency (Times/Week): 7, Weekly Exercise (Minutes/Week): 70, Intensity: Mild Healthy diet Good water intake   Goals Addressed               This Visit's Progress      Patient Stated     continue balance exercises (pt-stated)   On track     Core/strengthening exercises Stretching       Depression Screen    04/28/2022    9:07 AM 03/16/2022    8:34 AM 03/15/2021   10:03 AM 02/22/2021   11:07 AM 03/05/2020    9:44 AM 03/03/2020   10:45 AM 03/03/2019   11:07 AM  PHQ 2/9 Scores  PHQ - 2 Score 0 0 0 0 0 0 0    Fall Risk    04/28/2022    9:13 AM 03/16/2022    8:34 AM 02/22/2021   11:06 AM 03/05/2020    9:44 AM 07/25/2019    1:27 PM  Fall Risk   Falls in the past year? '1 1 1 1 '$ 0  Number falls in past yr:  1 0 0   Injury with Fall?  0 1 1   Comment    She lost her balance while stepping and sought medical attention with Urgent Care about a week ago.   Risk for fall due to : Impaired balance/gait Impaired balance/gait;Impaired mobility     Follow up Falls evaluation completed Falls evaluation completed Falls evaluation completed Falls evaluation completed Falls evaluation completed    FALL RISK PREVENTION PERTAINING TO THE HOME: Home free of loose throw rugs in walkways, pet beds, electrical cords, etc? Yes  Adequate lighting in your home to reduce risk of falls? Yes   ASSISTIVE DEVICES UTILIZED TO PREVENT FALLS: Life alert? No  Use of a cane, walker or w/c? Yes Grab bars in the bathroom? Yes  Shower chair or bench in shower? Yes  Elevated toilet seat or a handicapped toilet? Yes   TIMED UP AND GO: Was the test performed? Yes .   Cognitive Function: Patient is alert and oriented x3.  Followed by Neurology; last office visit 04/10/22.      03/01/2018    4:50 PM  02/28/2017    4:39 PM  MMSE - Mini Mental State Exam  Orientation to time 5 5  Orientation to Place 5 5  Registration 3 3  Attention/ Calculation 5 5  Recall 3 3  Language- name 2 objects 2 2  Language- repeat 1 1  Language- follow 3 step command 3 3  Language- read & follow direction 1 1  Write a sentence 1 1  Copy design 1 1  Total score 30 30        03/15/2021   10:10 AM  03/05/2020   10:04 AM 03/03/2019   11:09 AM  6CIT Screen  What Year? 0 points 0 points 0 points  What month? 0 points 0 points 0 points  What time? 0 points 0 points 0 points  Count back from 20 0 points  0 points  Months in reverse 0 points 0 points 0 points  Repeat phrase  0 points 0 points  Total Score   0 points    Immunizations Immunization History  Administered Date(s) Administered   Influenza, High Dose Seasonal PF 07/10/2017, 07/01/2018, 07/22/2021   Influenza-Unspecified 06/23/2013, 07/12/2015, 07/04/2016, 06/25/2019, 07/06/2020   PFIZER(Purple Top)SARS-COV-2 Vaccination 10/27/2019, 11/17/2019, 08/24/2020   Pneumococcal Conjugate-13 08/04/2014   Pneumococcal Polysaccharide-23 02/28/2017   Tdap 03/03/2020   Zoster Recombinat (Shingrix) 02/04/2021, 06/15/2021   Screening Tests Health Maintenance  Topic Date Due   MAMMOGRAM  03/14/2022   COVID-19 Vaccine (4 - Pfizer series) 05/14/2022 (Originally 10/19/2020)   INFLUENZA VACCINE  05/23/2022   TETANUS/TDAP  03/03/2030   Pneumonia Vaccine 83+ Years old  Completed   DEXA SCAN  Completed   Zoster Vaccines- Shingrix  Completed   HPV VACCINES  Aged Out   Health Maintenance Health Maintenance Due  Topic Date Due   MAMMOGRAM  03/14/2022   Lung Cancer Screening: (Low Dose CT Chest recommended if Age 43-80 years, 30 pack-year currently smoking OR have quit w/in 15years.) does not qualify.   Hepatitis C Screening: does not qualify.  Vision Screening: Recommended annual ophthalmology exams for early detection of glaucoma and other disorders of the eye.  Dental Screening: Recommended annual dental exams for proper oral hygiene  Community Resource Referral / Chronic Care Management: CRR required this visit?  No   CCM required this visit?  No      Plan:   Keep all routine maintenance appointments.   I have personally reviewed and noted the following in the patient's chart:   Medical and social history Use of alcohol,  tobacco or illicit drugs  Current medications and supplements including opioid prescriptions.  Functional ability and status Nutritional status Physical activity Advanced directives List of other physicians Hospitalizations, surgeries, and ER visits in previous 12 months Vitals Screenings to include cognitive, depression, and falls Referrals and appointments  In addition, I have reviewed and discussed with patient certain preventive protocols, quality metrics, and best practice recommendations. A written personalized care plan for preventive services as well as general preventive health recommendations were provided to patient.     Varney Biles, LPN   04/27/1950

## 2022-05-01 ENCOUNTER — Ambulatory Visit: Payer: Medicare Other

## 2022-05-03 ENCOUNTER — Ambulatory Visit: Payer: Medicare Other

## 2022-05-08 ENCOUNTER — Ambulatory Visit: Payer: Medicare Other | Admitting: Physical Therapy

## 2022-05-10 ENCOUNTER — Ambulatory Visit: Payer: Medicare Other

## 2022-05-15 ENCOUNTER — Ambulatory Visit: Payer: Medicare Other | Admitting: Physical Therapy

## 2022-05-17 ENCOUNTER — Ambulatory Visit: Payer: Medicare Other

## 2022-05-19 ENCOUNTER — Ambulatory Visit
Admission: RE | Admit: 2022-05-19 | Discharge: 2022-05-19 | Disposition: A | Discharge status: Home / Self Care | Payer: Medicare Other | Source: Ambulatory Visit | Attending: Internal Medicine | Admitting: Internal Medicine

## 2022-05-19 DIAGNOSIS — Z1231 Encounter for screening mammogram for malignant neoplasm of breast: Secondary | ICD-10-CM | POA: Insufficient documentation

## 2022-05-22 ENCOUNTER — Ambulatory Visit: Payer: Medicare Other

## 2022-05-24 ENCOUNTER — Other Ambulatory Visit: Payer: Self-pay | Admitting: Internal Medicine

## 2022-05-24 ENCOUNTER — Ambulatory Visit: Payer: Medicare Other

## 2022-05-29 ENCOUNTER — Ambulatory Visit: Payer: Medicare Other | Admitting: Physical Therapy

## 2022-05-31 ENCOUNTER — Ambulatory Visit: Payer: Medicare Other

## 2022-06-05 ENCOUNTER — Ambulatory Visit: Payer: Medicare Other | Admitting: Physical Therapy

## 2022-06-06 ENCOUNTER — Other Ambulatory Visit: Payer: Self-pay | Admitting: Internal Medicine

## 2022-06-06 ENCOUNTER — Telehealth: Payer: Self-pay | Admitting: Internal Medicine

## 2022-06-06 NOTE — Telephone Encounter (Signed)
The patient wanted her daughter in Law to call to update the provider on the information from [facility].

## 2022-06-06 NOTE — Telephone Encounter (Signed)
Pt daughter in law advised same as below. If not better by Thursday will call for appt

## 2022-06-06 NOTE — Telephone Encounter (Signed)
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 Stated they were hoping that once it was drained, pt would qualify for shunt placement. Pt was evaluated and based off their assessment, pt did not qualify. It is expected that after drainage that patients with improve on two of three areas : balance, endurance, speed. Patient has some balance improvement, but not much. Did not improve in endurance or speed. More cognitive testing was done, and a scan of her Hypocampus was done. 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 An FDG Pet-scan was suggested to look at glucose uptake. Neoma Laming has reached out to Dr Trena Platt office to follow up with him to order the FDG and follow patient for dementia.  Neoma Laming then stated to me that patient has had a headache, been nauseous and has been throwing up every 2 hours for 24 hours now. Neoma Laming stated she did reach out to the surgeon, was advised pt probably over did it yesterday (ran errands, cleaned a bit) and she needs to rest. Neoma Laming stated the surgeon was not concerned since it is within a week of procedure, was advised not abnormal.  No leakage from lumbar puncture, no fever.  Neoma Laming wondering if you can send in something for nausea for patient so she can rest? Or if patient needs to be evaluated

## 2022-06-06 NOTE — Telephone Encounter (Signed)
Pending other symptoms, dizziness - may be contributing to her nausea - especially after procedure she had.  Has she tried pepcid - just to see if something to reduce acid helps.  Bland foods.  Stay hydrated.  If persistent, will need to be seen.

## 2022-06-07 ENCOUNTER — Encounter: Payer: Self-pay | Admitting: Emergency Medicine

## 2022-06-07 ENCOUNTER — Telehealth: Payer: Self-pay | Admitting: Internal Medicine

## 2022-06-07 ENCOUNTER — Emergency Department: Payer: Medicare Other

## 2022-06-07 ENCOUNTER — Inpatient Hospital Stay
Admission: EM | Admit: 2022-06-07 | Discharge: 2022-06-14 | DRG: 641 | Disposition: A | Discharge status: Skilled Nursing Facility | Payer: Medicare Other | Attending: Osteopathic Medicine | Admitting: Osteopathic Medicine

## 2022-06-07 ENCOUNTER — Other Ambulatory Visit: Payer: Self-pay

## 2022-06-07 ENCOUNTER — Ambulatory Visit: Payer: Medicare Other

## 2022-06-07 DIAGNOSIS — I10 Essential (primary) hypertension: Secondary | ICD-10-CM | POA: Diagnosis present

## 2022-06-07 DIAGNOSIS — Z8261 Family history of arthritis: Secondary | ICD-10-CM

## 2022-06-07 DIAGNOSIS — Z8249 Family history of ischemic heart disease and other diseases of the circulatory system: Secondary | ICD-10-CM

## 2022-06-07 DIAGNOSIS — E870 Hyperosmolality and hypernatremia: Secondary | ICD-10-CM | POA: Diagnosis present

## 2022-06-07 DIAGNOSIS — E876 Hypokalemia: Secondary | ICD-10-CM | POA: Diagnosis present

## 2022-06-07 DIAGNOSIS — Z803 Family history of malignant neoplasm of breast: Secondary | ICD-10-CM

## 2022-06-07 DIAGNOSIS — Z7982 Long term (current) use of aspirin: Secondary | ICD-10-CM

## 2022-06-07 DIAGNOSIS — G912 (Idiopathic) normal pressure hydrocephalus: Secondary | ICD-10-CM

## 2022-06-07 DIAGNOSIS — Z79890 Hormone replacement therapy (postmenopausal): Secondary | ICD-10-CM

## 2022-06-07 DIAGNOSIS — Z888 Allergy status to other drugs, medicaments and biological substances status: Secondary | ICD-10-CM

## 2022-06-07 DIAGNOSIS — K219 Gastro-esophageal reflux disease without esophagitis: Secondary | ICD-10-CM | POA: Diagnosis present

## 2022-06-07 DIAGNOSIS — E538 Deficiency of other specified B group vitamins: Secondary | ICD-10-CM | POA: Diagnosis present

## 2022-06-07 DIAGNOSIS — Z79899 Other long term (current) drug therapy: Secondary | ICD-10-CM

## 2022-06-07 DIAGNOSIS — E7800 Pure hypercholesterolemia, unspecified: Secondary | ICD-10-CM | POA: Diagnosis present

## 2022-06-07 DIAGNOSIS — G4733 Obstructive sleep apnea (adult) (pediatric): Secondary | ICD-10-CM

## 2022-06-07 DIAGNOSIS — N393 Stress incontinence (female) (male): Secondary | ICD-10-CM | POA: Diagnosis present

## 2022-06-07 DIAGNOSIS — E871 Hypo-osmolality and hyponatremia: Secondary | ICD-10-CM | POA: Diagnosis not present

## 2022-06-07 DIAGNOSIS — N809 Endometriosis, unspecified: Secondary | ICD-10-CM | POA: Diagnosis present

## 2022-06-07 DIAGNOSIS — E039 Hypothyroidism, unspecified: Secondary | ICD-10-CM | POA: Diagnosis present

## 2022-06-07 DIAGNOSIS — R531 Weakness: Secondary | ICD-10-CM

## 2022-06-07 DIAGNOSIS — Z83438 Family history of other disorder of lipoprotein metabolism and other lipidemia: Secondary | ICD-10-CM

## 2022-06-07 LAB — BASIC METABOLIC PANEL
Anion gap: 10 (ref 5–15)
BUN: 14 mg/dL (ref 8–23)
CO2: 21 mmol/L — ABNORMAL LOW (ref 22–32)
Calcium: 9.6 mg/dL (ref 8.9–10.3)
Chloride: 85 mmol/L — ABNORMAL LOW (ref 98–111)
Creatinine, Ser: 0.61 mg/dL (ref 0.44–1.00)
GFR, Estimated: >60 mL/min (ref >60)
Glucose, Bld: 169 mg/dL — ABNORMAL HIGH (ref 70–99)
Potassium: 3.4 mmol/L — ABNORMAL LOW (ref 3.5–5.1)
Sodium: 116 mmol/L — CL (ref 135–145)

## 2022-06-07 LAB — CBC
HCT: 40.2 % (ref 36.0–46.0)
Hemoglobin: 14.4 g/dL (ref 12.0–15.0)
MCH: 30.9 pg (ref 26.0–34.0)
MCHC: 35.8 g/dL (ref 30.0–36.0)
MCV: 86.3 fL (ref 80.0–100.0)
Platelets: 301 10*3/uL (ref 150–400)
RBC: 4.66 MIL/uL (ref 3.87–5.11)
RDW: 11.9 % (ref 11.5–15.5)
WBC: 9.3 10*3/uL (ref 4.0–10.5)
nRBC: 0 % (ref 0.0–0.2)

## 2022-06-07 LAB — URINALYSIS, ROUTINE W REFLEX MICROSCOPIC
Bacteria, UA: NONE SEEN
Bilirubin Urine: NEGATIVE
Glucose, UA: 50 mg/dL — AB
Ketones, ur: NEGATIVE mg/dL
Leukocytes,Ua: NEGATIVE
Nitrite: NEGATIVE
Protein, ur: NEGATIVE mg/dL
Specific Gravity, Urine: 1.008 (ref 1.005–1.030)
pH: 7 (ref 5.0–8.0)

## 2022-06-07 LAB — OSMOLALITY, URINE: Osmolality, Ur: 264 mOsm/kg — ABNORMAL LOW (ref 300–900)

## 2022-06-07 LAB — SODIUM: Sodium: 121 mmol/L — ABNORMAL LOW (ref 135–145)

## 2022-06-07 LAB — SODIUM, URINE, RANDOM: Sodium, Ur: 47 mmol/L

## 2022-06-07 LAB — OSMOLALITY: Osmolality: 249 mOsm/kg — CL (ref 275–295)

## 2022-06-07 MED ORDER — SODIUM CHLORIDE 3 % IV SOLN
INTRAVENOUS | Status: DC
  Filled 2022-06-07: qty 500

## 2022-06-07 MED ORDER — SODIUM CHLORIDE 0.9 % IV BOLUS
1000.0000 mL | Freq: Once | INTRAVENOUS | Status: AC
  Administered 2022-06-07: 1000 mL via INTRAVENOUS

## 2022-06-07 MED ORDER — POTASSIUM CHLORIDE 20 MEQ PO PACK
40.0000 meq | PACK | ORAL | Status: AC
  Administered 2022-06-07: 40 meq via ORAL
  Filled 2022-06-07: qty 2

## 2022-06-07 MED ORDER — ONDANSETRON 4 MG PO TBDP
4.0000 mg | ORAL_TABLET | Freq: Once | ORAL | Status: AC
Start: 2022-06-07 — End: 2022-06-07
  Administered 2022-06-07: 4 mg via ORAL
  Filled 2022-06-07: qty 1

## 2022-06-07 NOTE — Telephone Encounter (Signed)
Pt son called in stating pt has been having compications from when she went to [employer]. Pt son stated that they put a port in and did a spinat tap. Pt son stated Belva Bertin drained fluid from her brain and they stated she might have headaches in which she has. Pt son states that pt has been throwing up and can not keep any food down. Sent to access nurse

## 2022-06-07 NOTE — ED Notes (Signed)
Lab reports Na 116; acuity level changed and will take pt to next available exam room

## 2022-06-07 NOTE — ED Provider Notes (Addendum)
[facility] Provider Note    Event Date/Time   First MD Initiated Contact with Patient 06/07/22 2049     (approximate)   History   Chief Complaint: Dizziness   HPI  Crystal Lynch is a 81 y.o. 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  She recently had extended CSF drainage at [facility] around August 9.  She was feeling well afterward for several days, and then 2 days ago on August 14 started becoming symptomatic.  No fever, no chest pain shortness of breath or abdominal pain.  For 2 days she was unable to eat.  Today she is has started to be able to eat following a brat diet, but has not had improvement in symptoms.  No seizures or syncope.   [facility] telemed 06/06/22 note: Crystal Lynch is a 81 y.o. female from [place] with past medical history of severe OSA who started having predominantly memory impairment 2-3 years ago and balance difficulty within the past year. She also has a small amount of stress incontinence. She was recently evaluated by neurology at [facility]. She obtained a brain MRI that showed cerebral ventriculomegaly. NeuroQuant study showed hippocampal volume at 19th percentile. 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 She underwent extended lumbar drain at [facility] 8/7-05/31/2022. Subjectively she reported some improvement in her walking, which she rated a 3-4/7 on the Global Rating of Change Scale. She presents with her daughter and husband for result review. She started having a headache with nausea and vomiting after going to the grocery store yesterday. The pain improved with bedrest, Tylenol, and Advil. She denies CSF leaking out of the drainage site.       Physical Exam   Triage Vital Signs: ED Triage Vitals [06/07/22 1955]  Enc Vitals Group     BP (!) 187/71     Pulse Rate (!) 59     Resp 19     Temp 98.6 F (37 C)     Temp Source Oral     SpO2 95 %     Weight      Height      Head Circumference      Peak Flow      Pain Score 5     Pain Loc      Pain Edu?      Excl. in [place]?     Most recent vital signs: Vitals:   06/07/22 1955 06/07/22 2100  BP: (!) 187/71 (!) 168/77  Pulse: (!) 59 (!) 56  Resp: 19 12  Temp: 98.6 F (37 C)   SpO2: 95% 97%    General: Awake, no distress.  CV:  Good peripheral perfusion.  Resp:  Normal effort.  Abd:  No distention.  Other:  Awake and alert.   ED Results / Procedures / Treatments   Labs (all labs ordered are listed, but only abnormal results are displayed) Labs Reviewed  BASIC METABOLIC PANEL - Abnormal; Notable for the following components:      Result Value   Sodium 116 (*)    Potassium 3.4 (*)    Chloride 85 (*)    CO2 21 (*)    Glucose, Bld 169 (*)    All other components within normal limits  CBC  URINALYSIS, ROUTINE W REFLEX MICROSCOPIC  SODIUM  SODIUM  SODIUM  SODIUM  OSMOLALITY  OSMOLALITY, URINE  SODIUM, URINE, RANDOM     EKG Interpreted by me Normal sinus rhythm rate of 62.  Normal axis, normal intervals.  Poor R wave progression.  Normal ST segments and T waves.   RADIOLOGY CT head interpreted by me, negative for cerebral edema.  Radiology report reviewed   PROCEDURES:  .Critical Care  Performed by: Carrie Mew, MD Authorized by: Carrie Mew, MD   Critical care provider statement:    Critical care time (minutes):  32   Critical care time was exclusive of:  Separately billable procedures and treating other patients   Critical care was necessary to treat or prevent imminent or life-threatening deterioration of the following conditions:  Metabolic crisis and dehydration   Critical care was time spent personally by me on the  following activities:  Development of treatment plan with patient or surrogate, discussions with consultants, evaluation of patient's response to treatment, examination of patient, obtaining history from patient or surrogate, ordering and performing treatments and interventions, ordering and review of laboratory studies, ordering and review of radiographic studies, pulse oximetry, re-evaluation of patient's condition and review of old charts   Care discussed with: admitting provider      MEDICATIONS ORDERED IN ED: Medications  sodium chloride (hypertonic) 3 % solution (has no administration in time range)  ondansetron (ZOFRAN-ODT) disintegrating tablet 4 mg (4 mg Oral Given by Other 06/07/22 2057)  sodium chloride 0.9 % bolus 1,000 mL (1,000 mLs Intravenous New Bag/Given 06/07/22 2058)  potassium chloride (KLOR-CON) packet 40 mEq (40 mEq Oral Given 06/07/22 2204)     IMPRESSION / MDM / [place] / ED COURSE  I reviewed the triage vital signs and the nursing notes.                              Differential diagnosis includes, but is not limited to, viral illness, electrolyte abnormality, AKI, anemia  Patient's presentation is most consistent with acute presentation with potential threat to life or bodily function.  Patient presents with dizziness, generalized weakness in the setting of nausea vomiting and p.o. intolerance for the last 2 days.  This seems unrelated to her recent lumbar puncture.  No signs of meningitis.  Vital signs unremarkable.  Labs show sodium of 116 which is acute from a baseline in the 130s.  We will give saline bolus, plan to admit for further management.  ----------------------------------------- 10:16 PM on 06/07/2022 ----------------------------------------- Presentation discussed with nephrology who recommends 3% hypertonic saline at a rate of 30 mL/h.  Discussed with ICU attending.       FINAL CLINICAL IMPRESSION(S) / ED DIAGNOSES   Final  diagnoses:  Acute hyponatremia  Generalized weakness     Rx / DC Orders   ED Discharge Orders     None        Note:  This document was prepared using Dragon voice recognition software and may include unintentional dictation errors.   Carrie Mew, MD 06/07/22 2119    Carrie Mew, MD 06/07/22 2216

## 2022-06-07 NOTE — ED Triage Notes (Signed)
Pt presents via POV with husband with c/o dizziness, nausea and vomiting. Pt recently had CSF fluid on 05/29/22. Pt husband reports dizziness was present before having fluid off. Main concern is weakness and inability to keep food and fluid down. Husband reports pt has vomited twice today. Pt reports being unable to walk due to dizziness.

## 2022-06-07 NOTE — ED Notes (Signed)
Patient placed in bed with bed low and locked and side rails raised x2. Fall risk bracelet applied to patient and pt has non skid shoes on in lieu of socks per pt request. Patient's call bell is in reach and cardiac monitor is in place. Patient's husband is at bedside. Bed alarm activated for pt stretcher.   Due to low sodium and pt history, seizure pads placed on bed as precaution. Oxygen and suction set up in room.   Patient provided with warm blanket for comfort.

## 2022-06-07 NOTE — ED Notes (Signed)
Pharmacy contacted r/t pt repeat Sodium Level result of 121. Increased from initial finding of 116. Per pharmacy, Hypertonic solution paused at this time and RN re-drawing sodium to confirm level. MD made aware.

## 2022-06-08 ENCOUNTER — Other Ambulatory Visit: Payer: Medicare Other

## 2022-06-08 DIAGNOSIS — K219 Gastro-esophageal reflux disease without esophagitis: Secondary | ICD-10-CM | POA: Diagnosis present

## 2022-06-08 DIAGNOSIS — E876 Hypokalemia: Secondary | ICD-10-CM | POA: Diagnosis present

## 2022-06-08 DIAGNOSIS — N393 Stress incontinence (female) (male): Secondary | ICD-10-CM | POA: Diagnosis present

## 2022-06-08 DIAGNOSIS — E039 Hypothyroidism, unspecified: Secondary | ICD-10-CM | POA: Diagnosis present

## 2022-06-08 DIAGNOSIS — E871 Hypo-osmolality and hyponatremia: Secondary | ICD-10-CM

## 2022-06-08 DIAGNOSIS — E538 Deficiency of other specified B group vitamins: Secondary | ICD-10-CM | POA: Diagnosis present

## 2022-06-08 DIAGNOSIS — Z79899 Other long term (current) drug therapy: Secondary | ICD-10-CM | POA: Diagnosis not present

## 2022-06-08 DIAGNOSIS — Z7982 Long term (current) use of aspirin: Secondary | ICD-10-CM | POA: Diagnosis not present

## 2022-06-08 DIAGNOSIS — E7800 Pure hypercholesterolemia, unspecified: Secondary | ICD-10-CM | POA: Diagnosis present

## 2022-06-08 DIAGNOSIS — Z8249 Family history of ischemic heart disease and other diseases of the circulatory system: Secondary | ICD-10-CM | POA: Diagnosis not present

## 2022-06-08 DIAGNOSIS — Z83438 Family history of other disorder of lipoprotein metabolism and other lipidemia: Secondary | ICD-10-CM | POA: Diagnosis not present

## 2022-06-08 DIAGNOSIS — G4733 Obstructive sleep apnea (adult) (pediatric): Secondary | ICD-10-CM | POA: Diagnosis present

## 2022-06-08 DIAGNOSIS — E870 Hyperosmolality and hypernatremia: Secondary | ICD-10-CM | POA: Diagnosis present

## 2022-06-08 DIAGNOSIS — Z79890 Hormone replacement therapy (postmenopausal): Secondary | ICD-10-CM | POA: Diagnosis not present

## 2022-06-08 DIAGNOSIS — I10 Essential (primary) hypertension: Secondary | ICD-10-CM | POA: Diagnosis present

## 2022-06-08 DIAGNOSIS — N809 Endometriosis, unspecified: Secondary | ICD-10-CM | POA: Diagnosis present

## 2022-06-08 DIAGNOSIS — Z888 Allergy status to other drugs, medicaments and biological substances status: Secondary | ICD-10-CM | POA: Diagnosis not present

## 2022-06-08 DIAGNOSIS — G912 (Idiopathic) normal pressure hydrocephalus: Secondary | ICD-10-CM | POA: Diagnosis present

## 2022-06-08 DIAGNOSIS — Z8261 Family history of arthritis: Secondary | ICD-10-CM | POA: Diagnosis not present

## 2022-06-08 DIAGNOSIS — Z803 Family history of malignant neoplasm of breast: Secondary | ICD-10-CM | POA: Diagnosis not present

## 2022-06-08 LAB — CBC
HCT: 38.6 % (ref 36.0–46.0)
Hemoglobin: 13.7 g/dL (ref 12.0–15.0)
MCH: 31.1 pg (ref 26.0–34.0)
MCHC: 35.5 g/dL (ref 30.0–36.0)
MCV: 87.5 fL (ref 80.0–100.0)
Platelets: 243 10*3/uL (ref 150–400)
RBC: 4.41 MIL/uL (ref 3.87–5.11)
RDW: 12.2 % (ref 11.5–15.5)
WBC: 8.3 10*3/uL (ref 4.0–10.5)
nRBC: 0 % (ref 0.0–0.2)

## 2022-06-08 LAB — BASIC METABOLIC PANEL
Anion gap: 8 (ref 5–15)
Anion gap: 6 (ref 5–15)
BUN: 11 mg/dL (ref 8–23)
BUN: 11 mg/dL (ref 8–23)
CO2: 20 mmol/L — ABNORMAL LOW (ref 22–32)
CO2: 22 mmol/L (ref 22–32)
Calcium: 9.2 mg/dL (ref 8.9–10.3)
Calcium: 9.3 mg/dL (ref 8.9–10.3)
Chloride: 99 mmol/L (ref 98–111)
Chloride: 99 mmol/L (ref 98–111)
Creatinine, Ser: 0.59 mg/dL (ref 0.44–1.00)
Creatinine, Ser: 0.57 mg/dL (ref 0.44–1.00)
GFR, Estimated: >60 mL/min (ref >60)
GFR, Estimated: >60 mL/min (ref >60)
Glucose, Bld: 100 mg/dL — ABNORMAL HIGH (ref 70–99)
Glucose, Bld: 104 mg/dL — ABNORMAL HIGH (ref 70–99)
Potassium: 3.7 mmol/L (ref 3.5–5.1)
Potassium: 3.3 mmol/L — ABNORMAL LOW (ref 3.5–5.1)
Sodium: 127 mmol/L — ABNORMAL LOW (ref 135–145)
Sodium: 127 mmol/L — ABNORMAL LOW (ref 135–145)

## 2022-06-08 LAB — COMPREHENSIVE METABOLIC PANEL
ALT: 16 U/L (ref 0–44)
AST: 24 U/L (ref 15–41)
Albumin: 3.6 g/dL (ref 3.5–5.0)
Alkaline Phosphatase: 46 U/L (ref 38–126)
Anion gap: 8 (ref 5–15)
BUN: 13 mg/dL (ref 8–23)
CO2: 22 mmol/L (ref 22–32)
Calcium: 9.5 mg/dL (ref 8.9–10.3)
Chloride: 97 mmol/L — ABNORMAL LOW (ref 98–111)
Creatinine, Ser: 0.68 mg/dL (ref 0.44–1.00)
GFR, Estimated: >60 mL/min (ref >60)
Glucose, Bld: 191 mg/dL — ABNORMAL HIGH (ref 70–99)
Potassium: 3.1 mmol/L — ABNORMAL LOW (ref 3.5–5.1)
Sodium: 127 mmol/L — ABNORMAL LOW (ref 135–145)
Total Bilirubin: 3.2 mg/dL — ABNORMAL HIGH (ref 0.3–1.2)
Total Protein: 5.7 g/dL — ABNORMAL LOW (ref 6.5–8.1)

## 2022-06-08 LAB — SODIUM
Sodium: 120 mmol/L — ABNORMAL LOW (ref 135–145)
Sodium: 124 mmol/L — ABNORMAL LOW (ref 135–145)
Sodium: 126 mmol/L — ABNORMAL LOW (ref 135–145)

## 2022-06-08 LAB — MAGNESIUM: Magnesium: 1.9 mg/dL (ref 1.7–2.4)

## 2022-06-08 LAB — TSH: TSH: 2.572 u[IU]/mL (ref 0.350–4.500)

## 2022-06-08 MED ORDER — BUTALBITAL-APAP-CAFFEINE 50-325-40 MG PO TABS
1.0000 | ORAL_TABLET | Freq: Once | ORAL | Status: AC
  Administered 2022-06-08: 1 via ORAL
  Filled 2022-06-08: qty 1

## 2022-06-08 MED ORDER — ACETAMINOPHEN 325 MG PO TABS
650.0000 mg | ORAL_TABLET | Freq: Four times a day (QID) | ORAL | Status: DC | PRN
  Administered 2022-06-08: 650 mg via ORAL
  Filled 2022-06-08: qty 2

## 2022-06-08 MED ORDER — ACETAMINOPHEN 650 MG RE SUPP: 650.0000 mg | Freq: Four times a day (QID) | RECTAL | Status: DC | PRN

## 2022-06-08 MED ORDER — DESMOPRESSIN ACETATE 4 MCG/ML IJ SOLN
2.0000 ug | Freq: Once | INTRAMUSCULAR | Status: AC
Start: 2022-06-08 — End: 2022-06-08
  Administered 2022-06-08: 2 ug via INTRAVENOUS
  Filled 2022-06-08: qty 1

## 2022-06-08 MED ORDER — DEXTROSE 5 % IV SOLN: INTRAVENOUS | Status: DC

## 2022-06-08 MED ORDER — ONDANSETRON HCL 4 MG/2ML IJ SOLN: 4.0000 mg | Freq: Once | INTRAMUSCULAR | Status: DC

## 2022-06-08 MED ORDER — HYDRALAZINE HCL 20 MG/ML IJ SOLN
10.0000 mg | Freq: Once | INTRAMUSCULAR | Status: AC
Start: 2022-06-08 — End: 2022-06-08
  Administered 2022-06-08: 10 mg via INTRAVENOUS
  Filled 2022-06-08: qty 1

## 2022-06-08 MED ORDER — POTASSIUM CHLORIDE 20 MEQ PO PACK
40.0000 meq | PACK | Freq: Once | ORAL | Status: AC
  Administered 2022-06-08: 40 meq via ORAL
  Filled 2022-06-08: qty 2

## 2022-06-08 NOTE — H&P (Signed)
History and Physical    Crystal Lynch QBH:[id number] DOB: 1941/06/22 DOA: 06/07/2022  PCP: Einar Pheasant, MD  Patient coming from: home  I have personally briefly reviewed patient's old medical records in [place]  Chief Complaint: n/v / ha   HPI: Crystal Lynch is a 81 y.o. female with medical history significant of  NPH, HTN, hypothyroidism, HLD,OSA, B12 def who has interim history of admission to [facility] for further evaluation of NPH and external lumbar drainage of csf vial lumbar spinal catheter. Per patient s/p evaluation she was noted not to qualify for shunt placement.  Patient states s/p procedure she had HA/Nausea.She states that  she was treated symptomatically, however now at home close to two weeks and has had worsening of symptoms of HA and nausea over the last three days. Due to worsening of symptoms patient presents to ED. Patient notes no, fever chills, does note increase balance issues compared to baseline. She denies dysuria, cough, sob , tremors or abdominal pain.   ED Course:  In ED on evaluation patient was fund to have  hyponatremia with NA of 116 down from base of 136.  Nephrology was called who recommended admission to SDU and starting on 3% saline.   Vitals afb, hr 58, bp 170/88 sat 96%  Labs: Na 116, K 3.4, bicar 21, gly 169, cr 0.61  Wbc 9.3, hgb 14.4, plt 301  OXB:[id number] cerebral atrophy and chronic white matter small vessel ischemic changes without evidence of an acute intracranial Abnormality UA : mod hgb, gly 50 Urine NA 47 Serum osmo 264 Cxr: NAD EKG:NSR no hyperacute st -twave changes   Tx  KCL 40 meq, Review of Systems: As per HPI otherwise 10 point review of systems negative.   Past Medical History:  Diagnosis Date   Arthritis    Chicken pox    Endometriosis    Esophagitis    s/p esophageal dilatation   GERD (gastroesophageal reflux disease)    History of ovarian cyst    Hypercholesterolemia    Hypertension     Hypothyroidism     Past Surgical History:  Procedure Laterality Date   ABDOMINAL SURGERY     found to have an ovarian cyst and endometriosis   APPENDECTOMY       reports that she has never smoked. She has never used smokeless tobacco. She reports that she does not drink alcohol and does not use drugs.  Allergies  Allergen Reactions   Meloxicam     BP issues    Family History  Problem Relation Age of Onset   Lung disease Father    Heart attack Father    Heart disease Mother        myocardial infarction   Breast cancer Mother 54   Heart attack Mother    Hypertension Mother    Hyperlipidemia Mother    Breast cancer Sister 69   Hypertension Sister    Rheum arthritis Sister    Breast cancer Maternal Aunt 105   Hypertension Brother    Colon cancer Neg Hx     Prior to Admission medications   Medication Sig Start Date End Date Taking? Authorizing Provider  aspirin 81 MG EC tablet Take 1 tablet (81 mg total) by mouth daily. Swallow whole. 11/27/21   Einar Pheasant, MD  Biotin 5 MG CAPS Take 5,000 mcg by mouth daily.    [provider]  carvedilol (COREG) 6.25 MG tablet Take 1 tablet (6.25 mg total) by mouth 2 (  two) times daily. 02/28/22   Wellington Hampshire, MD  cholecalciferol (VITAMIN D) 25 MCG (1000 UNIT) tablet Take 1,000 Units by mouth daily.    [provider]  dorzolamide-timolol (COSOPT) 22.3-6.8 MG/ML ophthalmic solution Place 1 drop into both eyes 2 (two) times daily.    [provider]  levothyroxine (SYNTHROID) 75 MCG tablet TAKE 1 TABLET BY MOUTH EVERY DAY 11/28/21   Einar Pheasant, MD  lisinopril (ZESTRIL) 40 MG tablet TAKE 1 TABLET BY MOUTH EVERY DAY 04/11/22   Einar Pheasant, MD  LORazepam (ATIVAN) 0.5 MG tablet Take 1/2 tablet prior to procedure.  May repeat x 1 if needed. 01/26/22   Einar Pheasant, MD  Multiple Vitamins-Minerals (ZINC PO) Take 1 tablet by mouth daily.    [provider]  pantoprazole (PROTONIX) 40 MG tablet TAKE 1  TABLET BY MOUTH EVERY DAY (WAITING ON PA) 11/29/21   Dutch Quint B, FNP  rosuvastatin (CRESTOR) 10 MG tablet TAKE 1 TABLET BY MOUTH EVERY DAY 11/28/21   Einar Pheasant, MD  spironolactone (ALDACTONE) 25 MG tablet TAKE 1 TABLET (25 MG TOTAL) BY MOUTH DAILY. 05/24/22   Einar Pheasant, MD    Physical Exam: Vitals:   06/07/22 1955 06/07/22 2100 06/07/22 2200 06/07/22 2305  BP: (!) 187/71 (!) 168/77 (!) 149/74 (!) 170/88  Pulse: (!) 59 (!) 56 66 (!) 58  Resp: '19 12 18 18  '$ Temp: 98.6 F (37 C)     TempSrc: Oral     SpO2: 95% 97% 98% 96%    Constitutional: NAD, calm, comfortable Vitals:   06/07/22 1955 06/07/22 2100 06/07/22 2200 06/07/22 2305  BP: (!) 187/71 (!) 168/77 (!) 149/74 (!) 170/88  Pulse: (!) 59 (!) 56 66 (!) 58  Resp: '19 12 18 18  '$ Temp: 98.6 F (37 C)     TempSrc: Oral     SpO2: 95% 97% 98% 96%   Eyes: PERRL, lids and conjunctivae normal ENMT: Mucous membranes are moist. Posterior pharynx clear of any exudate or lesions.Normal dentition.  Neck: normal, supple, no masses, no thyromegaly Respiratory: clear to auscultation bilaterally, no wheezing, no crackles. Normal respiratory effort. No accessory muscle use.  Cardiovascular: Regular rate and rhythm, no murmurs / rubs / gallops. No extremity edema. + pedal pulses. Abdomen: no tenderness, no masses palpated. No hepatosplenomegaly. Bowel sounds positive.  Musculoskeletal: no clubbing / cyanosis. No joint deformity upper and lower extremities. Good ROM, no contractures. Normal muscle tone.  Skin: no rashes, lesions, ulcers. No induration Neurologic: CN 2-12 grossly intact. Sensation intact, Strength 5/5 in all 4.  Psychiatric: Normal judgment and insight. Alert and oriented x 3. Normal mood.    Labs on Admission: I have personally reviewed following labs and imaging studies  CBC: Recent Labs  Lab 06/07/22 1950  WBC 9.3  HGB 14.4  HCT 40.2  MCV 86.3  PLT 063   Basic Metabolic Panel: Recent Labs  Lab 06/07/22 1950  06/07/22 2317  NA 116* 121*  K 3.4*  --   CL 85*  --   CO2 21*  --   GLUCOSE 169*  --   BUN 14  --   CREATININE 0.61  --   CALCIUM 9.6  --    GFR: CrCl cannot be calculated (Unknown ideal weight.). Liver Function Tests: No results for input(s): "AST", "ALT", "ALKPHOS", "BILITOT", "PROT", "ALBUMIN" in the last 168 hours. No results for input(s): "LIPASE", "AMYLASE" in the last 168 hours. No results for input(s): "AMMONIA" in the last 168 hours. Coagulation  Profile: No results for input(s): "INR", "PROTIME" in the last 168 hours. Cardiac Enzymes: No results for input(s): "CKTOTAL", "CKMB", "CKMBINDEX", "TROPONINI" in the last 168 hours. BNP (last 3 results) No results for input(s): "PROBNP" in the last 8760 hours. HbA1C: No results for input(s): "HGBA1C" in the last 72 hours. CBG: No results for input(s): "GLUCAP" in the last 168 hours. Lipid Profile: No results for input(s): "CHOL", "HDL", "LDLCALC", "TRIG", "CHOLHDL", "LDLDIRECT" in the last 72 hours. Thyroid Function Tests: No results for input(s): "TSH", "T4TOTAL", "FREET4", "T3FREE", "THYROIDAB" in the last 72 hours. Anemia Panel: No results for input(s): "VITAMINB12", "FOLATE", "FERRITIN", "TIBC", "IRON", "RETICCTPCT" in the last 72 hours. Urine analysis:    Component Value Date/Time   COLORURINE STRAW (A) 06/07/2022 2317   APPEARANCEUR CLEAR (A) 06/07/2022 2317   APPEARANCEUR Hazy (A) 05/06/2019 1108   LABSPEC 1.008 06/07/2022 2317   PHURINE 7.0 06/07/2022 2317   GLUCOSEU 50 (A) 06/07/2022 2317   GLUCOSEU NEGATIVE 09/13/2020 1451   HGBUR MODERATE (A) 06/07/2022 2317   BILIRUBINUR NEGATIVE 06/07/2022 2317   BILIRUBINUR neg 11/25/2021 1022   BILIRUBINUR Negative 05/06/2019 1108   KETONESUR NEGATIVE 06/07/2022 2317   PROTEINUR NEGATIVE 06/07/2022 2317   UROBILINOGEN 0.2 11/25/2021 1022   UROBILINOGEN 0.2 09/13/2020 1451   NITRITE NEGATIVE 06/07/2022 2317   LEUKOCYTESUR NEGATIVE 06/07/2022 2317    Radiological  Exams on Admission: DG Chest Portable 1 View  Result Date: 06/07/2022 CLINICAL DATA:  Weakness. EXAM: PORTABLE CHEST 1 VIEW COMPARISON:  Chest x-ray 02/23/2021 FINDINGS: The heart size and mediastinal contours are within normal limits. Both lungs are clear. The visualized skeletal structures are unremarkable. IMPRESSION: No active disease. Electronically Signed   By: Ronney Asters M.D.   On: 06/07/2022 23:43   CT Head Wo Contrast  Result Date: 06/07/2022 CLINICAL DATA:  Dizziness with nausea and vomiting. EXAM: CT HEAD WITHOUT CONTRAST TECHNIQUE: Contiguous axial images were obtained from the base of the skull through the vertex without intravenous contrast. RADIATION DOSE REDUCTION: This exam was performed according to the departmental dose-optimization program which includes automated exposure control, adjustment of the mA and/or kV according to patient size and/or use of iterative reconstruction technique. COMPARISON:  None Available. FINDINGS: Brain: There is mild cerebral atrophy with widening of the extra-axial spaces and mild to moderate severity ventricular dilatation. There are areas of decreased attenuation within the white matter tracts of the supratentorial brain, consistent with microvascular disease changes. Vascular: No hyperdense vessel or unexpected calcification. Skull: Normal. Negative for fracture or focal lesion. Sinuses/Orbits: No acute finding. Other: None. 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 Electronically Signed   By: Virgina Norfolk M.D.   On: 06/07/2022 20:37    EKG: Independently reviewed. See above  Assessment/Plan  Hyponatremia -admit to SDU -q4h NA  - 3% Na per renal recs  - await renal final recs in am  -goal correction 10-12 meq over 24 hours  -titrate 3% Na base on labs results    NPH -recent admit to [facility]  -s/p external lumbar drainage  of csf vial lumbar spinal catheter -patient not candidate for shunt  -prn ELD per d/c recs  -patient will need f/u with outpatient neurologist on discharge   HTN -borderline control  -treat HA -resume home regimen  - prn anti-htn    Hypothyroidism  -check tsh - resume synthroid    HLD -resume home regimen    OSA -cpap qhs  as able  -monitor continuous pulse ox   DVT prophylaxis: scd Code Status: full Family Communication: none at bedisde Disposition Plan: patient  expected to be admitted greater than 2 midnights  Consults called:  [place] MD-renal Admission status: SDU   Clance Boll MD [facility]  If 7PM-7AM, please contact night-coverage [email or web address] Password [facility]  06/08/2022, 12:22 AM

## 2022-06-08 NOTE — Progress Notes (Addendum)
MEDICATION RELATED CONSULT NOTE - INITIAL   Pharmacy Consult for Hypertonic Saline  Indication: Hyponatremia   Allergies  Allergen Reactions   Meloxicam     BP issues    Patient Measurements:   Adjusted Body Weight:   Vital Signs: Temp: 98.6 F (37 C) (08/16 1955) Temp Source: Oral (08/16 1955) BP: 147/69 (08/17 0100) Pulse Rate: 57 (08/17 0100) Intake/Output from previous day: 08/16 0701 - 08/17 0700 In: 1000 [IV Piggyback:1000] Out: -  Intake/Output from this shift: Total I/O In: 1000 [IV Piggyback:1000] Out: -   Labs: Recent Labs    06/07/22 1950  WBC 9.3  HGB 14.4  HCT 40.2  PLT 301  CREATININE 0.61   CrCl cannot be calculated (Unknown ideal weight.).   Microbiology: No results found for this or any previous visit (from the past 720 hour(s)).  Medical History: Past Medical History:  Diagnosis Date   Arthritis    Chicken pox    Endometriosis    Esophagitis    s/p esophageal dilatation   GERD (gastroesophageal reflux disease)    History of ovarian cyst    Hypercholesterolemia    Hypertension    Hypothyroidism     Medications:    Assessment: 8/16:  Na @ 1950 = 116 - 0.9% NaCl 1,000 mL bolus given @ 2058  8/16:  Na @ 2317 = 121 (5 mmol/L increase in just ~ 3 hrs) - 3% NaCl started at 2323 - AFTER previous result was drawn.  - RN contacted pharmacy with above result.  3% NaCl was paused @ ~ 0052.   Repeat Na ordered.   8/17:  Na @ 0006 = 124.  -  3% NaCl on hold as of 8/17 @ 0200.   8/17: Na @ 0207 = 127 (11 mmol/L increase in 6 hrs)   8/17: Unsure for sudden rise in Na considering 3% NaCl only ran for ~ 30 min.  NP consulted nephrology.   -  DDAVP 2 mcg IV X 1 ordered for 8/17 @ ~ 0500.  -  D5W @ 30 ml/hr ordered to start.  -  Will continue Na checks Q4H.   Goal of Therapy:   Goal of correction 8 mmol per 24 hours If sodium rises > 4 mEq/L over 2 hours   > 6 mEq/L over 4 hours - Pharmacy will evaluate and contact MD per  consult  Plan:  Will continue to hold 3% NaCl.  Awaiting hospitalist to review.  - D5W started on 8/17 @ 0500 at 30 cc/hr. - DDAVP 2 mcg IV X 1 ordered. - Will continue Na Q4H.     Orma Cheetham D 06/08/2022,1:50 AM

## 2022-06-08 NOTE — Telephone Encounter (Signed)
Just FYI patient hospitalized.

## 2022-06-08 NOTE — Progress Notes (Signed)
This is a no charge note as patient was admitted this AM.  Patient seen and examined H&P reviewed.  Patient's husband at bedside  Crystal Lynch is a 81 y.o. 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 Per patient s/p evaluation she was noted not to qualify for shunt placement.  Patient states s/p procedure she had HA/Nausea.She states that  she was treated symptomatically, however now at home close to two weeks and has had worsening of symptoms of HA and nausea over the last three days. Due to worsening of symptoms patient presents to ED. Patient notes no, fever chills, does note increase balance issues compared to baseline. She denies dysuria, cough, sob , tremors or abdominal pain  Feels better has no complaints or neurologic issues  Sodium 121>>>127  Cta  Reg , s2/s2 Soft benign Aaxoxo3   A/P Nephrology following Monitor labs On D5 now

## 2022-06-08 NOTE — ED Notes (Signed)
Pharmacy contacted again regarding pt newly resulted Sodium level of 124. Hypertonic solution remains paused. Hospitalist paged requesting evaluation and orders. RN is awaiting response.

## 2022-06-08 NOTE — Consult Note (Signed)
[facility]  CONSULT NOTE    Date: 06/08/2022                  Patient Name:  Bentlie Withem  MRN: [id number]  DOB: 09-13-1941  Age / Sex: 81 y.o., female         PCP: Einar Pheasant, MD                 Service Requesting Consult: [place]                 Reason for Consult: Hyponatremia            History of Present Illness: Ms. Liset Mcmonigle is a 81 y.o.  female with past medical history consisting of hypertension, hypothyroidism, B12 deficiency, obstructive sleep apnea, hyperlipidemia, NPH, who was admitted to [facility] on 06/07/2022 for Hypernatremia [E87.0] Hyponatremia [E87.1]  Patient presents to the emergency department with complaints of nausea, vomiting, and headache.  Patient is seen resting on stretcher, spouse at bedside.  They state patient has complained of headache and nausea for the past 3 to 4 days.  Denies known fever or chills.  According to husband, patient recently underwent a extended lumbar drain at [facility] last week.  They were told with discharge instructions that patient may experience nausea, vomiting, and headache within the next 7 days.  Patient has been unable to tolerate oral intake and they were worried of dehydration.  Labs on ED arrival include sodium 116 potassium 3.4, serum bicarb 21, glucose 169, serum osmolality 249.  UA is clear with moderate hematuria.  Urine osmolality 264.  CT head positive for age-related changes.  Chest x-ray negative for acute findings.   Medications: Outpatient medications: (Not in a hospital admission)   Current medications: Current Facility-Administered Medications  Medication Dose Route Frequency Provider Last Rate Last Admin   acetaminophen (TYLENOL) tablet 650 mg  650 mg Oral Q6H PRN Clance Boll, MD       Or   acetaminophen (TYLENOL) suppository 650 mg  650 mg Rectal Q6H PRN Clance Boll, MD       dextrose 5 % solution   Intravenous Continuous Colon Flattery, NP 75 mL/hr at 06/08/22 0920 Rate Change at 06/08/22 0920   Current Outpatient Medications  Medication Sig Dispense Refill   aspirin 81 MG EC tablet Take 1 tablet (81 mg total) by mouth daily. Swallow whole. 30 tablet 0   Biotin 5 MG CAPS Take 5,000 mcg by mouth daily.     carvedilol (COREG) 25 MG tablet Take 25 mg by mouth 2 (two) times daily.     levothyroxine (SYNTHROID) 75 MCG tablet TAKE 1 TABLET BY MOUTH EVERY DAY 90 tablet 1   lisinopril (ZESTRIL) 40 MG tablet TAKE 1 TABLET BY MOUTH EVERY DAY 90 tablet 1   Multiple Vitamins-Minerals (ZINC PO) Take 1 tablet by mouth daily.     pantoprazole (PROTONIX) 40 MG tablet TAKE 1 TABLET BY MOUTH EVERY DAY (WAITING ON PA) 90 tablet 1   rosuvastatin (CRESTOR) 10 MG tablet TAKE 1 TABLET BY MOUTH EVERY DAY 90 tablet 1   spironolactone (ALDACTONE) 25 MG tablet TAKE 1 TABLET (25 MG TOTAL) BY MOUTH DAILY. 30 tablet 1   carvedilol (COREG) 6.25 MG tablet Take 1 tablet (6.25 mg total) by mouth 2 (two) times daily. (Patient not taking: Reported on 06/08/2022) 180 tablet 0   cholecalciferol (VITAMIN D) 25 MCG (1000 UNIT) tablet Take 1,000 Units by  mouth daily. (Patient not taking: Reported on 06/08/2022)     dorzolamide-timolol (COSOPT) 22.3-6.8 MG/ML ophthalmic solution Place 1 drop into both eyes 2 (two) times daily.     LORazepam (ATIVAN) 0.5 MG tablet Take 1/2 tablet prior to procedure.  May repeat x 1 if needed. (Patient not taking: Reported on 06/08/2022) 2 tablet 0      Allergies: Allergies  Allergen Reactions   Meloxicam     BP issues      Past Medical History: Past Medical History:  Diagnosis Date   Arthritis    Chicken pox    Endometriosis    Esophagitis    s/p esophageal dilatation   GERD (gastroesophageal reflux disease)    History of ovarian cyst    Hypercholesterolemia    Hypertension    Hypothyroidism      Past Surgical History: Past Surgical History:  Procedure Laterality Date   ABDOMINAL SURGERY     found to  have an ovarian cyst and endometriosis   APPENDECTOMY       Family History: Family History  Problem Relation Age of Onset   Lung disease Father    Heart attack Father    Heart disease Mother        myocardial infarction   Breast cancer Mother 75   Heart attack Mother    Hypertension Mother    Hyperlipidemia Mother    Breast cancer Sister 28   Hypertension Sister    Rheum arthritis Sister    Breast cancer Maternal Aunt 21   Hypertension Brother    Colon cancer Neg Hx      Social History: Social History   Socioeconomic History   Marital status: Married    Spouse name: Not on file   Number of children: Not on file   Years of education: Not on file   Highest education level: Not on file  Occupational History   Not on file  Tobacco Use   Smoking status: Never   Smokeless tobacco: Never  Vaping Use   Vaping Use: Never used  Substance and Sexual Activity   Alcohol use: No    Alcohol/week: 0.0 standard drinks of alcohol   Drug use: No   Sexual activity: Never  Other Topics Concern   Not on file  Social History Narrative   Not on file   Social Determinants of Health   Financial Resource Strain: Low Risk  (04/28/2022)   Overall Financial Resource Strain (CARDIA)    Difficulty of Paying Living Expenses: Not hard at all  Food Insecurity: No Food Insecurity (04/28/2022)   Hunger Vital Sign    Worried About Running Out of Food in the Last Year: Never true    Ran Out of Food in the Last Year: Never true  Transportation Needs: No Transportation Needs (04/28/2022)   PRAPARE - Hydrologist (Medical): No    Lack of Transportation (Non-Medical): No  Physical Activity: Insufficiently Active (04/28/2022)   Exercise Vital Sign    Days of Exercise per Week: 7 days    Minutes of Exercise per Session: 10 min  Stress: No Stress Concern Present (04/28/2022)   [place]    Feeling of Stress :  Not at all  Social Connections: Unknown (04/28/2022)   Social Connection and Isolation Panel [NHANES]    Frequency of Communication with Friends and Family: More than three times a week    Frequency of Social Gatherings with Friends  and Family: More than three times a week    Attends Religious Services: More than 4 times per year    Active Member of Clubs or Organizations: Not on file    Attends Archivist Meetings: Not on file    Marital Status: Married  Intimate Partner Violence: Not At Risk (04/28/2022)   Humiliation, Afraid, Rape, and Kick questionnaire    Fear of Current or Ex-Partner: No    Emotionally Abused: No    Physically Abused: No    Sexually Abused: No     Review of Systems: Review of Systems  Constitutional:  Negative for chills, fever and malaise/fatigue.  HENT:  Negative for congestion, sore throat and tinnitus.   Eyes:  Negative for blurred vision and redness.  Respiratory:  Negative for cough, shortness of breath and wheezing.   Cardiovascular:  Negative for chest pain, palpitations, claudication and leg swelling.  Gastrointestinal:  Positive for nausea and vomiting. Negative for abdominal pain, blood in stool and diarrhea.  Genitourinary:  Negative for flank pain, frequency and hematuria.  Musculoskeletal:  Negative for back pain, falls and myalgias.  Skin:  Negative for rash.  Neurological:  Positive for headaches. Negative for dizziness and weakness.  Endo/Heme/Allergies:  Does not bruise/bleed easily.  Psychiatric/Behavioral:  Negative for depression. The patient is not nervous/anxious and does not have insomnia.     Vital Signs: Blood pressure (!) 137/50, pulse (!) 58, temperature 98 F (36.7 C), resp. rate 17, weight 59.1 kg, last menstrual period 10/21/1984, SpO2 100 %.  Weight trends: Filed Weights   06/08/22 1200  Weight: 59.1 kg    Physical Exam: General: NAD, resting comfortably  Head: Normocephalic, atraumatic. Moist oral mucosal  membranes  Eyes: Anicteric  Lungs:  Clear to auscultation, normal effort, room air  Heart: Regular rate and rhythm  Abdomen:  Soft, nontender, nondistended  Extremities: No peripheral edema.  Neurologic: Nonfocal, moving all four extremities  Skin: No lesions  Access: None     Lab results: Basic Metabolic Panel: Recent Labs  Lab 06/08/22 0219 06/08/22 0600 06/08/22 1109  NA 127* 127* 127*  K 3.7 3.3* 3.1*  CL 99 99 97*  CO2 20* 22 22  GLUCOSE 100* 104* 191*  BUN '11 11 13  '$ CREATININE 0.59 0.57 0.68  CALCIUM 9.2 9.3 9.5  MG  --  1.9  --     Liver Function Tests: Recent Labs  Lab 06/08/22 1109  AST 24  ALT 16  ALKPHOS 46  BILITOT 3.2*  PROT 5.7*  ALBUMIN 3.6   No results for input(s): "LIPASE", "AMYLASE" in the last 168 hours. No results for input(s): "AMMONIA" in the last 168 hours.  CBC: Recent Labs  Lab 06/07/22 1950 06/08/22 1109  WBC 9.3 8.3  HGB 14.4 13.7  HCT 40.2 38.6  MCV 86.3 87.5  PLT 301 243    Cardiac Enzymes: No results for input(s): "CKTOTAL", "CKMB", "CKMBINDEX", "TROPONINI" in the last 168 hours.  BNP: Invalid input(s): "POCBNP"  CBG: No results for input(s): "GLUCAP" in the last 168 hours.  Microbiology: Results for orders placed or performed in visit on 11/25/21  Urine Culture     Status: Abnormal   Collection Time: 11/25/21 10:19 AM   Specimen: Urine  Result Value Ref Range Status   MICRO NUMBER: [id number]  Final   SPECIMEN QUALITY: Adequate  Final   Sample Source URINE  Final   STATUS: FINAL  Final   ISOLATE 1: Escherichia coli (A)  Final  Comment: Greater than 100,000 CFU/mL of Escherichia coli      Susceptibility   Escherichia coli - URINE CULTURE, REFLEX    AMOX/CLAVULANIC 4 Sensitive     AMPICILLIN >=32 Resistant     AMPICILLIN/SULBACTAM 16 Intermediate     CEFAZOLIN* <=4 Not Reportable      * For infections other than uncomplicated UTI caused by E. coli, K. pneumoniae or P. mirabilis: Cefazolin is resistant  if MIC > or = 8 mcg/mL. (Distinguishing susceptible versus intermediate for isolates with MIC < or = 4 mcg/mL requires additional testing.) For uncomplicated UTI caused by E. coli, K. pneumoniae or P. mirabilis: Cefazolin is susceptible if MIC <32 mcg/mL and predicts susceptible to the oral agents cefaclor, cefdinir, cefpodoxime, cefprozil, cefuroxime, cephalexin and loracarbef.     CEFTAZIDIME <=1 Sensitive     CEFEPIME <=1 Sensitive     CEFTRIAXONE <=1 Sensitive     CIPROFLOXACIN <=0.25 Sensitive     LEVOFLOXACIN <=0.12 Sensitive     GENTAMICIN <=1 Sensitive     IMIPENEM <=0.25 Sensitive     NITROFURANTOIN <=16 Sensitive     PIP/TAZO <=4 Sensitive     TOBRAMYCIN <=1 Sensitive     TRIMETH/SULFA* <=20 Sensitive      * For infections other than uncomplicated UTI caused by E. coli, K. pneumoniae or P. mirabilis: Cefazolin is resistant if MIC > or = 8 mcg/mL. (Distinguishing susceptible versus intermediate for isolates with MIC < or = 4 mcg/mL requires additional testing.) For uncomplicated UTI caused by E. coli, K. pneumoniae or P. mirabilis: Cefazolin is susceptible if MIC <32 mcg/mL and predicts susceptible to the oral agents cefaclor, cefdinir, cefpodoxime, cefprozil, cefuroxime, cephalexin and loracarbef. Legend: S = Susceptible  I = Intermediate R = Resistant  NS = Not susceptible * = Not tested  NR = Not reported **NN = See antimicrobic comments     Coagulation Studies: No results for input(s): "LABPROT", "INR" in the last 72 hours.  Urinalysis: Recent Labs    06/07/22 2317  COLORURINE STRAW*  LABSPEC 1.008  PHURINE 7.0  GLUCOSEU 50*  HGBUR MODERATE*  BILIRUBINUR NEGATIVE  KETONESUR NEGATIVE  PROTEINUR NEGATIVE  NITRITE NEGATIVE  LEUKOCYTESUR NEGATIVE      Imaging: DG Chest Portable 1 View  Result Date: 06/07/2022 CLINICAL DATA:  Weakness. EXAM: PORTABLE CHEST 1 VIEW COMPARISON:  Chest x-ray 02/23/2021 FINDINGS: The heart size and mediastinal  contours are within normal limits. Both lungs are clear. The visualized skeletal structures are unremarkable. IMPRESSION: No active disease. Electronically Signed   By: Ronney Asters M.D.   On: 06/07/2022 23:43   CT Head Wo Contrast  Result Date: 06/07/2022 CLINICAL DATA:  Dizziness with nausea and vomiting. EXAM: CT HEAD WITHOUT CONTRAST TECHNIQUE: Contiguous axial images were obtained from the base of the skull through the vertex without intravenous contrast. RADIATION DOSE REDUCTION: This exam was performed according to the departmental dose-optimization program which includes automated exposure control, adjustment of the mA and/or kV according to patient size and/or use of iterative reconstruction technique. COMPARISON:  None Available. FINDINGS: Brain: There is mild cerebral atrophy with widening of the extra-axial spaces and mild to moderate severity ventricular dilatation. There are areas of decreased attenuation within the white matter tracts of the supratentorial brain, consistent with microvascular disease changes. Vascular: No hyperdense vessel or unexpected calcification. Skull: Normal. Negative for fracture or focal lesion. Sinuses/Orbits: No acute finding. Other: None. 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 Electronically Signed   By: Virgina Norfolk M.D.   On: 06/07/2022 20:37     Assessment & Plan: Ms. Dejai Schubach is a 81 y.o.  female with past medical history consisting of hypertension, hypothyroidism, B12 deficiency, obstructive sleep apnea, hyperlipidemia, NPH, who was admitted to [facility] on 06/07/2022 for Hypernatremia [E87.0] Hyponatremia [E87.1]  Acute hyponatremia initially believed to be due to SNS adaptation however patient was recently treated for hydrocephalus at [facility] and received a lumbar drain.  We are likely witnessing the effects as the body self  corrects the sodium.  Patient given D5 IV fluid to stabilize correction.  Also received DDAVP overnight.  Will increase fluid to D5 75 mL/h.  We will monitor patient for now.  2.  Hypokalemia.  Receiving oral supplementation.   LOS: 0 Kayston Jodoin 8/17/20233:11 PM

## 2022-06-08 NOTE — Progress Notes (Signed)
Dr Candiss Norse with Nephrology informed of patient's Sodium. Advised to stop IV Dextrose at 75 ml/hr and recheck level with next lab draw.  Infusion stopped.

## 2022-06-08 NOTE — ED Notes (Signed)
Patient resting in bed free from sign of distress. Breathing unlabored speaking in full sentences with symmetric chest rise and fall. Bed low and locked with side rails raised x2. Call bell in reach and monitor in place.   Seizure precautions and bed alarm remain in use.   Pt denies pain or other needs at this time. Blankets adjusted on pt to promote warmth and comfort.

## 2022-06-08 NOTE — Progress Notes (Signed)
MEDICATION RELATED CONSULT NOTE - INITIAL   Pharmacy Consult for Hypertonic Saline  Indication: Hyponatremia   Allergies  Allergen Reactions   Meloxicam     BP issues    Patient Measurements:   Adjusted Body Weight:   Vital Signs: Temp: 98.5 F (36.9 C) (08/17 0306) Temp Source: Oral (08/17 0306) BP: 139/63 (08/17 0600) Pulse Rate: 57 (08/17 0600) Intake/Output from previous day: 08/16 0701 - 08/17 0700 In: 1000 [IV Piggyback:1000] Out: 1900 [Urine:1900] Intake/Output from this shift: Total I/O In: 1000 [IV Piggyback:1000] Out: 1900 [Urine:1900]  Labs: Recent Labs    06/07/22 1950 06/08/22 0219 06/08/22 0600  WBC 9.3  --   --   HGB 14.4  --   --   HCT 40.2  --   --   PLT 301  --   --   CREATININE 0.61 0.59 0.57  MG  --   --  1.9    CrCl cannot be calculated (Unknown ideal weight.).   Microbiology: No results found for this or any previous visit (from the past 720 hour(s)).  Medical History: Past Medical History:  Diagnosis Date   Arthritis    Chicken pox    Endometriosis    Esophagitis    s/p esophageal dilatation   GERD (gastroesophageal reflux disease)    History of ovarian cyst    Hypercholesterolemia    Hypertension    Hypothyroidism     Medications:    Assessment: 8/16:  Na @ 1950 = 116 - 0.9% NaCl 1,000 mL bolus given @ 2058  8/16:  Na @ 2317 = 121 (5 mmol/L increase in just ~ 3 hrs) - 3% NaCl started at 2323 - AFTER previous result was drawn.  - RN contacted pharmacy with above result.  3% NaCl was paused @ ~ 0052.   Repeat Na ordered.   8/17:  Na @ 0006 = 124.  -  3% NaCl on hold as of 8/17 @ 0200.   8/17: Na @ 0207 = 127 (11 mmol/L increase in 6 hrs)   8/17: Unsure for sudden rise in Na considering 3% NaCl only ran for ~ 30 min.  NP consulted nephrology.   -  DDAVP 2 mcg IV X 1 ordered for 8/17 @ ~ 0500.  -  D5W @ 30 ml/hr ordered to start.  -  Will continue Na checks Q4H.   8/17: Na @ 0600 = 127,  no rise in Na in past 4  hrs since DDAVP and D5W started.   Goal of Therapy:   Goal of correction 8 mmol per 24 hours If sodium rises > 4 mEq/L over 2 hours   > 6 mEq/L over 4 hours - Pharmacy will evaluate and contact MD per consult  Plan:  8/17:  Na @ 0600 = 127 - Will continue to monitor Na Q4H   Crystal Lynch 06/08/2022,6:25 AM

## 2022-06-08 NOTE — ED Notes (Signed)
Patient resting in bed free from sign of distress. Breathing unlabored speaking in full sentences with symmetric chest rise and fall. Bed low and locked with side rails raised x2. Call bell in reach and monitor in place.   

## 2022-06-08 NOTE — ED Notes (Signed)
No new orders regarding pt Sodium at this time per response to RN page by provider.

## 2022-06-08 NOTE — Progress Notes (Signed)
Patient arrives floor at 8:15pm. Vitals in Flowsheets but BP is  194/89 and MAP 111. Hydralazine ordered by Sharion Settler, NP. Patient states Headache 9/10. Fioricet administered.  Patient's current Sodium level from Labs is 120 at 8:30pm. Dropped from 126. NP informed.

## 2022-06-08 NOTE — ED Notes (Signed)
Patient resting in bed free from sign of distress. Breathing unlabored speaking in full sentences with symmetric chest rise and fall. Bed low and locked with side rails raised x2. Call bell in reach and monitor in place.   Seizure precautions and bed alarm in use.

## 2022-06-08 NOTE — ED Notes (Signed)
BMP and Mg collected at this time per written orders in Epic. [employer] system stating that pt has no orders at this time. Specimen sent with temporary pt label from [employer]quest and lab called to inform of incoming sample.

## 2022-06-08 NOTE — ED Notes (Signed)
Patient resting in bed free from sign of distress. Breathing unlabored speaking in full sentences with symmetric chest rise and fall. Bed low and locked with side rails raised x2. Call bell in reach and monitor in place. Seizure precautions and bed alarm remain in use.

## 2022-06-08 NOTE — Progress Notes (Signed)
[employer] - Patient with history of NPH and recent work up for shunt placement at [place] was admitted for hyponatremia Sodium levels up a 116 to 127 in 6 hours.   Action - DDAVP 2 mcg D5W at 30 ml/h Continue to monitor sodium levels. Mag added to next blood draw  Response VSS, no change in neuro function  Kathlene Cote NP

## 2022-06-09 DIAGNOSIS — G912 (Idiopathic) normal pressure hydrocephalus: Secondary | ICD-10-CM

## 2022-06-09 DIAGNOSIS — E870 Hyperosmolality and hypernatremia: Secondary | ICD-10-CM | POA: Diagnosis not present

## 2022-06-09 DIAGNOSIS — G4733 Obstructive sleep apnea (adult) (pediatric): Secondary | ICD-10-CM

## 2022-06-09 LAB — SODIUM
Sodium: 118 mmol/L — CL (ref 135–145)
Sodium: 119 mmol/L — CL (ref 135–145)
Sodium: 119 mmol/L — CL (ref 135–145)
Sodium: 121 mmol/L — ABNORMAL LOW (ref 135–145)
Sodium: 121 mmol/L — ABNORMAL LOW (ref 135–145)
Sodium: 123 mmol/L — ABNORMAL LOW (ref 135–145)
Sodium: 122 mmol/L — ABNORMAL LOW (ref 135–145)
Sodium: 123 mmol/L — ABNORMAL LOW (ref 135–145)

## 2022-06-09 LAB — BASIC METABOLIC PANEL
Anion gap: 8 (ref 5–15)
BUN: 10 mg/dL (ref 8–23)
CO2: 22 mmol/L (ref 22–32)
Calcium: 9.3 mg/dL (ref 8.9–10.3)
Chloride: 91 mmol/L — ABNORMAL LOW (ref 98–111)
Creatinine, Ser: 0.55 mg/dL (ref 0.44–1.00)
GFR, Estimated: >60 mL/min (ref >60)
Glucose, Bld: 93 mg/dL (ref 70–99)
Potassium: 3.6 mmol/L (ref 3.5–5.1)
Sodium: 121 mmol/L — ABNORMAL LOW (ref 135–145)

## 2022-06-09 MED ORDER — SODIUM CHLORIDE 3 % IV SOLN
INTRAVENOUS | Status: DC
Start: 2022-06-09 — End: 2022-06-09
  Filled 2022-06-09: qty 500

## 2022-06-09 MED ORDER — LEVOTHYROXINE SODIUM 50 MCG PO TABS
75.0000 ug | ORAL_TABLET | Freq: Every day | ORAL | Status: DC
  Administered 2022-06-10 – 2022-06-14 (×5): 75 ug via ORAL
  Filled 2022-06-09 (×5): qty 1

## 2022-06-09 MED ORDER — SODIUM CHLORIDE 1 G PO TABS
1.0000 g | ORAL_TABLET | Freq: Three times a day (TID) | ORAL | Status: DC
  Administered 2022-06-09 – 2022-06-14 (×16): 1 g via ORAL
  Filled 2022-06-09 (×16): qty 1

## 2022-06-09 MED ORDER — ROSUVASTATIN CALCIUM 10 MG PO TABS
10.0000 mg | ORAL_TABLET | Freq: Every day | ORAL | Status: DC
  Administered 2022-06-09 – 2022-06-13 (×5): 10 mg via ORAL
  Filled 2022-06-09 (×5): qty 1

## 2022-06-09 MED ORDER — AMLODIPINE BESYLATE 5 MG PO TABS
5.0000 mg | ORAL_TABLET | Freq: Every day | ORAL | Status: DC
  Administered 2022-06-09 – 2022-06-14 (×6): 5 mg via ORAL
  Filled 2022-06-09 (×6): qty 1

## 2022-06-09 MED ORDER — FUROSEMIDE 10 MG/ML IJ SOLN
20.0000 mg | Freq: Once | INTRAMUSCULAR | Status: AC
  Administered 2022-06-09: 20 mg via INTRAVENOUS
  Filled 2022-06-09: qty 2

## 2022-06-09 MED ORDER — SODIUM CHLORIDE 0.9 % IV SOLN: INTRAVENOUS | Status: DC

## 2022-06-09 NOTE — Progress Notes (Signed)
Pablo Ledger., NP has placed order for NS at 75 ml/hr. Administered. See MAR.

## 2022-06-09 NOTE — Progress Notes (Signed)
Paged doctor at [phone]. Will continue to monitor patient. Sodium labs drawn at 3 am. Performed Q4 Neuro checks.

## 2022-06-09 NOTE — Progress Notes (Signed)
Patient was not started on 3% hypertonic solution r/t no ICU beds. PMD and Nephrology aware, Sodium tabs ordered and NA labs being monitored. Patient aware of plan of care.  Crystal Lynch, Tivis Ringer, RN

## 2022-06-09 NOTE — Progress Notes (Signed)
Sharion Settler, NP has placed orders for patient to be transferred to ICU.

## 2022-06-09 NOTE — Progress Notes (Signed)
Lab called. Sodium dropped to 119.

## 2022-06-09 NOTE — Progress Notes (Signed)
PROGRESS NOTE    Crystal Lynch  WUJ:[id number] DOB: 02-17-1941 DOA: 06/07/2022 PCP: Einar Pheasant, MD    Brief Narrative:   Crystal Lynch is a 81 y.o. female with medical history significant of  NPH, HTN, hypothyroidism, HLD,OSA, B12 def who has interim history of admission to [facility] for further evaluation of NPH and external lumbar drainage of csf vial lumbar spinal catheter. Per patient s/p evaluation she was noted not to qualify for shunt placement.  Patient states s/p procedure she had HA/Nausea.She states that  she was treated symptomatically, however now at home close to two weeks and has had worsening of symptoms of HA and nausea over the last three days. Due to worsening of symptoms patient presents to ED. Patient notes no, fever chills, does note increase balance issues compared to baseline. She denies dysuria, cough, sob , tremors or abdominal pain.     ED Course:  In ED on evaluation patient was fund to have  hyponatremia with NA of 116 down from base of 136.  Nephrology was called who recommended admission to SDU and starting on 3% saline.     8/18 119 this am. Being transferred to [place]  Consultants:  nephrology  Procedures:   Antimicrobials:      Subjective: No sob, dizziness, weakness, or HA.  Objective: Vitals:   06/08/22 2017 06/08/22 2322 06/09/22 0837 06/09/22 1207  BP: (!) 194/89 (!) 175/69 (!) 190/67 (!) 155/74  Pulse: (!) 56 79 61 70  Resp: '19 19 16 16  '$ Temp: (!) 97.4 F (36.3 C) 97.9 F (36.6 C) 97.7 F (36.5 C) 98.7 F (37.1 C)  TempSrc: Oral Oral Oral Oral  SpO2: 99% 99% 97% 98%  Weight: 58.6 kg     Height: '5\' 2"'$  (1.575 m)       Intake/Output Summary (Last 24 hours) at 06/09/2022 1500 Last data filed at 06/09/2022 1411 Gross per 24 hour  Intake 671.36 ml  Output 450 ml  Net 221.36 ml   Filed Weights   06/08/22 1200 06/08/22 2017  Weight: 59.1 kg 58.6 kg    Examination: Calm, NAD Cta no w/r Reg s1/s2 no gallop Soft  benign +bs No edema Aaoxo2 not to current date. 5/5 strength, grossly intact Mood and affect appropriate in current setting     Data Reviewed: I have personally reviewed following labs and imaging studies  CBC: Recent Labs  Lab 06/07/22 1950 06/08/22 1109  WBC 9.3 8.3  HGB 14.4 13.7  HCT 40.2 38.6  MCV 86.3 87.5  PLT 301 956   Basic Metabolic Panel: Recent Labs  Lab 06/07/22 1950 06/07/22 2317 06/08/22 0219 06/08/22 0600 06/08/22 1109 06/08/22 1603 06/09/22 0443 06/09/22 0636 06/09/22 0829 06/09/22 1039 06/09/22 1311  NA 116*   < > 127* 127* 127*   < > 119* 121* 121* 121* 123*  K 3.4*  --  3.7 3.3* 3.1*  --   --   --  3.6  --   --   CL 85*  --  99 99 97*  --   --   --  91*  --   --   CO2 21*  --  20* 22 22  --   --   --  22  --   --   GLUCOSE 169*  --  100* 104* 191*  --   --   --  93  --   --   BUN 14  --  '11 11 13  '$ --   --   --  10  --   --   CREATININE 0.61  --  0.59 0.57 0.68  --   --   --  0.55  --   --   CALCIUM 9.6  --  9.2 9.3 9.5  --   --   --  9.3  --   --   MG  --   --   --  1.9  --   --   --   --   --   --   --    < > = values in this interval not displayed.   GFR: Estimated Creatinine Clearance: 42.9 mL/min (by C-G formula based on SCr of 0.55 mg/dL). Liver Function Tests: Recent Labs  Lab 06/08/22 1109  AST 24  ALT 16  ALKPHOS 46  BILITOT 3.2*  PROT 5.7*  ALBUMIN 3.6   No results for input(s): "LIPASE", "AMYLASE" in the last 168 hours. No results for input(s): "AMMONIA" in the last 168 hours. Coagulation Profile: No results for input(s): "INR", "PROTIME" in the last 168 hours. Cardiac Enzymes: No results for input(s): "CKTOTAL", "CKMB", "CKMBINDEX", "TROPONINI" in the last 168 hours. BNP (last 3 results) No results for input(s): "PROBNP" in the last 8760 hours. HbA1C: No results for input(s): "HGBA1C" in the last 72 hours. CBG: No results for input(s): "GLUCAP" in the last 168 hours. Lipid Profile: No results for input(s): "CHOL",  "HDL", "LDLCALC", "TRIG", "CHOLHDL", "LDLDIRECT" in the last 72 hours. Thyroid Function Tests: Recent Labs    06/08/22 0219  TSH 2.572   Anemia Panel: No results for input(s): "VITAMINB12", "FOLATE", "FERRITIN", "TIBC", "IRON", "RETICCTPCT" in the last 72 hours. Sepsis Labs: No results for input(s): "PROCALCITON", "LATICACIDVEN" in the last 168 hours.  No results found for this or any previous visit (from the past 240 hour(s)).       Radiology Studies: DG Chest Portable 1 View  Result Date: 06/07/2022 CLINICAL DATA:  Weakness. EXAM: PORTABLE CHEST 1 VIEW COMPARISON:  Chest x-ray 02/23/2021 FINDINGS: The heart size and mediastinal contours are within normal limits. Both lungs are clear. The visualized skeletal structures are unremarkable. IMPRESSION: No active disease. Electronically Signed   By: Ronney Asters M.D.   On: 06/07/2022 23:43   CT Head Wo Contrast  Result Date: 06/07/2022 CLINICAL DATA:  Dizziness with nausea and vomiting. EXAM: CT HEAD WITHOUT CONTRAST TECHNIQUE: Contiguous axial images were obtained from the base of the skull through the vertex without intravenous contrast. RADIATION DOSE REDUCTION: This exam was performed according to the departmental dose-optimization program which includes automated exposure control, adjustment of the mA and/or kV according to patient size and/or use of iterative reconstruction technique. COMPARISON:  None Available. FINDINGS: Brain: There is mild cerebral atrophy with widening of the extra-axial spaces and mild to moderate severity ventricular dilatation. There are areas of decreased attenuation within the white matter tracts of the supratentorial brain, consistent with microvascular disease changes. Vascular: No hyperdense vessel or unexpected calcification. Skull: Normal. Negative for fracture or focal lesion. Sinuses/Orbits: No acute finding. Other: None. IMPRESSION: Generalized cerebral atrophy and chronic white matter small vessel  ischemic changes without evidence of an acute intracranial abnormality. Electronically Signed   By: Virgina Norfolk M.D.   On: 06/07/2022 20:37        Scheduled Meds:  amLODipine  5 mg Oral Daily   [START ON 06/10/2022] levothyroxine  75 mcg Oral Q0600   ondansetron (ZOFRAN) IV  4 mg Intravenous Once   rosuvastatin  10 mg Oral Daily  sodium chloride  1 g Oral TID WC   Continuous Infusions:  Assessment & Plan:   Active Problems:   Hypertension   Hypercholesterolemia   Hyponatremia   NPH (normal pressure hydrocephalus) (HCC)   OSA (obstructive sleep apnea)   Hyponatremia Transfer to [facility] Nephrology following Likely cns adaptation .  Recently been treated for NPH at [facility] with lumbar puncture drain.  Has been having nausea for a while. 3% hypertonic solution started. Not a candidate for tolvaptan due to liver disease seen on abdominal ultrasound in February 2023 20 mg furosemide IV x1 Salt tablets 1 g 3 times daily and fluid restriction Monitor Na closely     NPH -recent admit to [facility]  -s/p external lumbar drainage of csf vial lumbar spinal catheter -patient not candidate for shunt  -prn ELD per d/c recs  -8/18 patient will need to follow-up with outpatient neurologist on discharge      HTN Start amlodipine, discussed with pharmacy as it will not cause any sodium level changes     Hypothyroidism  Resume Synthroid     HLD Resume statins     OSA Does not tolerate CPAP     DVT prophylaxis: SCD Code Status: Full for Family Communication: None at bedside Disposition Plan: Back home Status is: Inpatient Remains inpatient appropriate because: IV treatment      LOS: 1 day   Time spent: 35 minutes    Nolberto Hanlon, MD [facility] Pager [phone]  If 7PM-7AM, please contact night-coverage 06/09/2022, 3:00 PM

## 2022-06-09 NOTE — Progress Notes (Signed)
CPAP placed on patient

## 2022-06-09 NOTE — Progress Notes (Signed)
Dr. Candiss Norse informed of current values from 5 am draw. Na result is 119. Doctor Candiss Norse has advised for NS to continue for now and will place orders for 3% Sodium to be initiated in ICU

## 2022-06-09 NOTE — Telephone Encounter (Addendum)
Patient husband said sodium level was low is why she could not move her feet or legs, and then her sodium keeps dropping they are trying to move her back to ICU even though she can now move her legs and feet they wanted her back in ICU in order to give her the level of sodium by IV , since they had no room in ICU they are giving lower dose sodium IV and substituting sodium tablets as well.Family aware MD out of office.

## 2022-06-09 NOTE — Telephone Encounter (Signed)
Reviewed.  Pt's son called.  It appears that Crystal Lynch was admitted.  Please call for update and let them know that I have been out of the office. Thank you.

## 2022-06-09 NOTE — Progress Notes (Signed)
Per report from day shift nurse, 0.3% NaCl was not administered and patient stayed on floor. Patient was observed during the day. No apparent confusion during this shift.

## 2022-06-09 NOTE — Progress Notes (Signed)
MEDICATION RELATED CONSULT NOTE - INITIAL   Pharmacy Consult for Hypertonic Saline  Indication: Hyponatremia   Allergies  Allergen Reactions   Meloxicam     BP issues    Patient Measurements: Height: '5\' 2"'$  (157.5 cm) Weight: 58.6 kg (129 lb 3 oz) IBW/kg (Calculated) : 50.1 Adjusted Body Weight:   Vital Signs: Temp: 97.9 F (36.6 C) (08/17 2322) Temp Source: Oral (08/17 2322) BP: 175/69 (08/17 2322) Pulse Rate: 79 (08/17 2322) Intake/Output from previous day: 08/17 0701 - 08/18 0700 In: 1227.9 [P.O.:240; I.V.:987.9] Out: -  Intake/Output from this shift: Total I/O In: 431.4 [I.V.:431.4] Out: -   Labs: Recent Labs    06/07/22 1950 06/08/22 0219 06/08/22 0600 06/08/22 1109  WBC 9.3  --   --  8.3  HGB 14.4  --   --  13.7  HCT 40.2  --   --  38.6  PLT 301  --   --  243  CREATININE 0.61 0.59 0.57 0.68  MG  --   --  1.9  --   ALBUMIN  --   --   --  3.6  PROT  --   --   --  5.7*  AST  --   --   --  24  ALT  --   --   --  16  ALKPHOS  --   --   --  46  BILITOT  --   --   --  3.2*    Estimated Creatinine Clearance: 42.9 mL/min (by C-G formula based on SCr of 0.68 mg/dL).   Microbiology: No results found for this or any previous visit (from the past 720 hour(s)).  Medical History: Past Medical History:  Diagnosis Date   Arthritis    Chicken pox    Endometriosis    Esophagitis    s/p esophageal dilatation   GERD (gastroesophageal reflux disease)    History of ovarian cyst    Hypercholesterolemia    Hypertension    Hypothyroidism     Medications:    Assessment: 8/16:  Na @ 1950 = 116 - 0.9% NaCl 1,000 mL bolus given @ 2058  8/16:  Na @ 2317 = 121 (5 mmol/L increase in just ~ 3 hrs) - 3% NaCl started at 2323 - AFTER previous result was drawn.  - RN contacted pharmacy with above result.  3% NaCl was paused @ ~ 0052.   Repeat Na ordered.   8/17:  Na @ 0006 = 124.  -  3% NaCl on hold as of 8/17 @ 0200.   8/17: Na @ 0207 = 127 (11 mmol/L increase  in 6 hrs)   8/17: Unsure for sudden rise in Na considering 3% NaCl only ran for ~ 30 min.  NP consulted nephrology.   -  DDAVP 2 mcg IV X 1 ordered for 8/17 @ ~ 0500.  -  D5W @ 30 ml/hr ordered to start.  -  Will continue Na checks Q4H.   8/17: Na @ 0600 = 127,  no rise in Na in past 4 hrs since DDAVP and D5W started.   Goal of Therapy:   Goal of correction 8 mmol per 24 hours If sodium rises > 4 mEq/L over 2 hours   > 6 mEq/L over 4 hours - Pharmacy will evaluate and contact MD per consult  Plan:  8/18:  Na @ 0443 = 119.  8/18:  3% Hypertonic Saline restarted at 30 ml/hr.  Will continue to follow Na levels Q4H.  Ridley Dileo D 06/09/2022,6:27 AM

## 2022-06-09 NOTE — Progress Notes (Signed)
[facility]  ROUNDING NOTE   Subjective:   Patient seen sitting up in bed, eating breakfast Alert and oriented Denies nausea or vomiting Denies headache or dizziness No lower extremity edema  Sodium 121  Objective:  Vital signs in last 24 hours:  Temp:  [97.4 F (36.3 C)-98.7 F (37.1 C)] 98.7 F (37.1 C) (08/18 1207) Pulse Rate:  [50-79] 70 (08/18 1207) Resp:  [16-19] 16 (08/18 1207) BP: (146-194)/(51-89) 155/74 (08/18 1207) SpO2:  [92 %-100 %] 98 % (08/18 1207) Weight:  [58.6 kg] 58.6 kg (08/17 2017)  Weight change:  Filed Weights   06/08/22 1200 06/08/22 2017  Weight: 59.1 kg 58.6 kg    Intake/Output: I/O last 3 completed shifts: In: 2227.9 [P.O.:240; I.V.:987.9; IV Piggyback:1000] Out: 1900 [Urine:1900]   Intake/Output this shift:  Total I/O In: -  Out: 450 [Urine:450]  Physical Exam: General: NAD, resting comfortably  Head: Normocephalic, atraumatic. Moist oral mucosal membranes  Eyes: Anicteric  Lungs:  Clear to auscultation, normal effort  Heart: Regular rate and rhythm  Abdomen:  Soft, nontender, nondistended  Extremities: No peripheral edema.  Neurologic: Nonfocal, moving all four extremities  Skin: No lesions  Access: None    Basic Metabolic Panel: Recent Labs  Lab 06/07/22 1950 06/07/22 2317 06/08/22 0219 06/08/22 0600 06/08/22 1109 06/08/22 1603 06/09/22 0303 06/09/22 0443 06/09/22 0636 06/09/22 0829 06/09/22 1039  NA 116*   < > 127* 127* 127*   < > 119* 119* 121* 121* 121*  K 3.4*  --  3.7 3.3* 3.1*  --   --   --   --  3.6  --   CL 85*  --  99 99 97*  --   --   --   --  91*  --   CO2 21*  --  20* 22 22  --   --   --   --  22  --   GLUCOSE 169*  --  100* 104* 191*  --   --   --   --  93  --   BUN 14  --  '11 11 13  '$ --   --   --   --  10  --   CREATININE 0.61  --  0.59 0.57 0.68  --   --   --   --  0.55  --   CALCIUM 9.6  --  9.2 9.3 9.5  --   --   --   --  9.3  --   MG  --   --   --  1.9  --   --   --   --   --   --    --    < > = values in this interval not displayed.    Liver Function Tests: Recent Labs  Lab 06/08/22 1109  AST 24  ALT 16  ALKPHOS 46  BILITOT 3.2*  PROT 5.7*  ALBUMIN 3.6   No results for input(s): "LIPASE", "AMYLASE" in the last 168 hours. No results for input(s): "AMMONIA" in the last 168 hours.  CBC: Recent Labs  Lab 06/07/22 1950 06/08/22 1109  WBC 9.3 8.3  HGB 14.4 13.7  HCT 40.2 38.6  MCV 86.3 87.5  PLT 301 243    Cardiac Enzymes: No results for input(s): "CKTOTAL", "CKMB", "CKMBINDEX", "TROPONINI" in the last 168 hours.  BNP: Invalid input(s): "POCBNP"  CBG: No results for input(s): "GLUCAP" in the last 168 hours.  Microbiology: Results for orders placed or  performed in visit on 11/25/21  Urine Culture     Status: Abnormal   Collection Time: 11/25/21 10:19 AM   Specimen: Urine  Result Value Ref Range Status   MICRO NUMBER: [id number]  Final   SPECIMEN QUALITY: Adequate  Final   Sample Source URINE  Final   STATUS: FINAL  Final   ISOLATE 1: Escherichia coli (A)  Final    Comment: Greater than 100,000 CFU/mL of Escherichia coli      Susceptibility   Escherichia coli - URINE CULTURE, REFLEX    AMOX/CLAVULANIC 4 Sensitive     AMPICILLIN >=32 Resistant     AMPICILLIN/SULBACTAM 16 Intermediate     CEFAZOLIN* <=4 Not Reportable      * For infections other than uncomplicated UTI caused by E. coli, K. pneumoniae or P. mirabilis: Cefazolin is resistant if MIC > or = 8 mcg/mL. (Distinguishing susceptible versus intermediate for isolates with MIC < or = 4 mcg/mL requires additional testing.) For uncomplicated UTI caused by E. coli, K. pneumoniae or P. mirabilis: Cefazolin is susceptible if MIC <32 mcg/mL and predicts susceptible to the oral agents cefaclor, cefdinir, cefpodoxime, cefprozil, cefuroxime, cephalexin and loracarbef.     CEFTAZIDIME <=1 Sensitive     CEFEPIME <=1 Sensitive     CEFTRIAXONE <=1 Sensitive     CIPROFLOXACIN <=0.25 Sensitive      LEVOFLOXACIN <=0.12 Sensitive     GENTAMICIN <=1 Sensitive     IMIPENEM <=0.25 Sensitive     NITROFURANTOIN <=16 Sensitive     PIP/TAZO <=4 Sensitive     TOBRAMYCIN <=1 Sensitive     TRIMETH/SULFA* <=20 Sensitive      * For infections other than uncomplicated UTI caused by E. coli, K. pneumoniae or P. mirabilis: Cefazolin is resistant if MIC > or = 8 mcg/mL. (Distinguishing susceptible versus intermediate for isolates with MIC < or = 4 mcg/mL requires additional testing.) For uncomplicated UTI caused by E. coli, K. pneumoniae or P. mirabilis: Cefazolin is susceptible if MIC <32 mcg/mL and predicts susceptible to the oral agents cefaclor, cefdinir, cefpodoxime, cefprozil, cefuroxime, cephalexin and loracarbef. Legend: S = Susceptible  I = Intermediate R = Resistant  NS = Not susceptible * = Not tested  NR = Not reported **NN = See antimicrobic comments     Coagulation Studies: No results for input(s): "LABPROT", "INR" in the last 72 hours.  Urinalysis: Recent Labs    06/07/22 2317  COLORURINE STRAW*  LABSPEC 1.008  PHURINE 7.0  GLUCOSEU 50*  HGBUR MODERATE*  BILIRUBINUR NEGATIVE  KETONESUR NEGATIVE  PROTEINUR NEGATIVE  NITRITE NEGATIVE  LEUKOCYTESUR NEGATIVE      Imaging: DG Chest Portable 1 View  Result Date: 06/07/2022 CLINICAL DATA:  Weakness. EXAM: PORTABLE CHEST 1 VIEW COMPARISON:  Chest x-ray 02/23/2021 FINDINGS: The heart size and mediastinal contours are within normal limits. Both lungs are clear. The visualized skeletal structures are unremarkable. IMPRESSION: No active disease. Electronically Signed   By: Ronney Asters M.D.   On: 06/07/2022 23:43   CT Head Wo Contrast  Result Date: 06/07/2022 CLINICAL DATA:  Dizziness with nausea and vomiting. EXAM: CT HEAD WITHOUT CONTRAST TECHNIQUE: Contiguous axial images were obtained from the base of the skull through the vertex without intravenous contrast. RADIATION DOSE REDUCTION: This exam was performed  according to the departmental dose-optimization program which includes automated exposure control, adjustment of the mA and/or kV according to patient size and/or use of iterative reconstruction technique. COMPARISON:  None Available. FINDINGS: Brain: There is mild cerebral  atrophy with widening of the extra-axial spaces and mild to moderate severity ventricular dilatation. There are areas of decreased attenuation within the white matter tracts of the supratentorial brain, consistent with microvascular disease changes. Vascular: No hyperdense vessel or unexpected calcification. Skull: Normal. Negative for fracture or focal lesion. Sinuses/Orbits: No acute finding. Other: None. 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 Electronically Signed   By: Virgina Norfolk M.D.   On: 06/07/2022 20:37     Medications:     ondansetron (ZOFRAN) IV  4 mg Intravenous Once   sodium chloride  1 g Oral TID WC   acetaminophen **OR** acetaminophen  Assessment/ Plan:  [name] is a 81 y.o.  female  with past medical history consisting of hypertension, hypothyroidism, B12 deficiency, obstructive sleep apnea, hyperlipidemia, NPH, who was admitted to [facility] on 06/07/2022 for Acute hyponatremia [E87.1] Hypernatremia [E87.0] Hyponatremia [E87.1] Generalized weakness [R53.1]   Acute hyponatremia initially believed to be due to SNS adaptation however patient was recently treated for hydrocephalus at [facility] and received a lumbar drain.   Sodium continued to decline overnight, 119.  Currently 121.  IV fluids stopped and suggested 3% hypertonic solution.  Per hospital protocol, patient would have to receive this therapy in ICU.  Currently awaiting ICU bed. Patient not a candidate for tolvaptan due to liver disease seen on abdominal ultrasound in February 2023. We will treat sodium with furosemide 20 mg IV  once, salt tabs 1 g 3 times daily, and fluid restriction.  We will continue to monitor.  If sodium levels remain decreased, will recommend 3% hypertonic solution with ICU transfer.    2. Hypokalemia.  Corrected with oral supplementation.    LOS: 1 Crystal Lynch 8/18/20231:32 PM

## 2022-06-09 NOTE — Progress Notes (Signed)
Sodium lab results at 3:30 am is 119

## 2022-06-09 NOTE — Progress Notes (Signed)
Dr Candiss Norse with Nephrology has been paged and Charge Nurse informed. Will continue to monitor

## 2022-06-09 NOTE — Telephone Encounter (Signed)
Left message to return call to office Sons number not in chart will continue to try and contact. Please if son calls back please attain number.

## 2022-06-09 NOTE — Progress Notes (Signed)
Unable to contact doctor via page at [phone]. Sent secure chat message and Nurse Practitioner informed. Advised to keep paging every 15 minutes.

## 2022-06-10 DIAGNOSIS — E870 Hyperosmolality and hypernatremia: Secondary | ICD-10-CM | POA: Diagnosis not present

## 2022-06-10 LAB — CREATININE, SERUM
Creatinine, Ser: 0.72 mg/dL (ref 0.44–1.00)
GFR, Estimated: >60 mL/min (ref >60)

## 2022-06-10 LAB — SODIUM
Sodium: 125 mmol/L — ABNORMAL LOW (ref 135–145)
Sodium: 126 mmol/L — ABNORMAL LOW (ref 135–145)
Sodium: 127 mmol/L — ABNORMAL LOW (ref 135–145)
Sodium: 127 mmol/L — ABNORMAL LOW (ref 135–145)
Sodium: 128 mmol/L — ABNORMAL LOW (ref 135–145)
Sodium: 128 mmol/L — ABNORMAL LOW (ref 135–145)

## 2022-06-10 LAB — POTASSIUM: Potassium: 3.2 mmol/L — ABNORMAL LOW (ref 3.5–5.1)

## 2022-06-10 LAB — OSMOLALITY, URINE: Osmolality, Ur: 168 mOsm/kg — ABNORMAL LOW (ref 300–900)

## 2022-06-10 MED ORDER — ACETAMINOPHEN 500 MG PO TABS
500.0000 mg | ORAL_TABLET | Freq: Four times a day (QID) | ORAL | Status: DC | PRN
  Administered 2022-06-12: 500 mg via ORAL
  Filled 2022-06-10: qty 1

## 2022-06-10 NOTE — Progress Notes (Signed)
PROGRESS NOTE    Ronelle Michie  TFT:[id number] DOB: Jan 09, 1941 DOA: 06/07/2022 PCP: Einar Pheasant, MD    Brief Narrative:   Crystal Lynch is a 81 y.o. female with medical history significant of  NPH, HTN, hypothyroidism, HLD,OSA, B12 def who has interim history of admission to [facility] for further evaluation of NPH and external lumbar drainage of csf vial lumbar spinal catheter. Per patient s/p evaluation she was noted not to qualify for shunt placement.  Patient states s/p procedure she had HA/Nausea.She states that  she was treated symptomatically, however now at home close to two weeks and has had worsening of symptoms of HA and nausea over the last three days. Due to worsening of symptoms patient presents to ED. Patient notes no, fever chills, does note increase balance issues compared to baseline. She denies dysuria, cough, sob , tremors or abdominal pain.     ED Course:  In ED on evaluation patient was fund to have  hyponatremia with NA of 116 down from base of 136.  Nephrology was called who recommended admission to SDU and starting on 3% saline.     8/18 119 this am. Being transferred to [facility] 8/19 feels better today, just feels weak.  Sodium 126 this a.m.  Consultants:  nephrology  Procedures:   Antimicrobials:      Subjective: No sob, dizziness, or abd pain  Objective: Vitals:   06/09/22 2338 06/10/22 0500 06/10/22 0514 06/10/22 0747  BP: (!) 149/77  136/64 136/80  Pulse: 66  62 68  Resp: '18  18 16  '$ Temp: 98 F (36.7 C)  98.6 F (37 C) 97.8 F (36.6 C)  TempSrc: Oral     SpO2: 97%  99% 98%  Weight:  57.9 kg    Height:        Intake/Output Summary (Last 24 hours) at 06/10/2022 0902 Last data filed at 06/09/2022 2339 Gross per 24 hour  Intake 240 ml  Output 2450 ml  Net -2210 ml   Filed Weights   06/08/22 1200 06/08/22 2017 06/10/22 0500  Weight: 59.1 kg 58.6 kg 57.9 kg    Examination: Calm, NAD Cta no w/r Reg s1/s2 no gallop Soft benign  +bs No edema Aaoxox2 not to date. Good strength x4  Mood and affect appropriate in current setting     Data Reviewed: I have personally reviewed following labs and imaging studies  CBC: Recent Labs  Lab 06/07/22 1950 06/08/22 1109  WBC 9.3 8.3  HGB 14.4 13.7  HCT 40.2 38.6  MCV 86.3 87.5  PLT 301 706   Basic Metabolic Panel: Recent Labs  Lab 06/07/22 1950 06/07/22 2317 06/08/22 0219 06/08/22 0600 06/08/22 1109 06/08/22 1603 06/09/22 0829 06/09/22 1039 06/09/22 1311 06/09/22 1559 06/09/22 2004 06/10/22 0052 06/10/22 0457  NA 116*   < > 127* 127* 127*   < > 121*   < > 123* 122* 123* 125* 126*  K 3.4*  --  3.7 3.3* 3.1*  --  3.6  --   --   --   --   --   --   CL 85*  --  99 99 97*  --  91*  --   --   --   --   --   --   CO2 21*  --  20* 22 22  --  22  --   --   --   --   --   --   GLUCOSE 169*  --  100* 104* 191*  --  93  --   --   --   --   --   --   BUN 14  --  '11 11 13  '$ --  10  --   --   --   --   --   --   CREATININE 0.61  --  0.59 0.57 0.68  --  0.55  --   --   --   --   --   --   CALCIUM 9.6  --  9.2 9.3 9.5  --  9.3  --   --   --   --   --   --   MG  --   --   --  1.9  --   --   --   --   --   --   --   --   --    < > = values in this interval not displayed.   GFR: Estimated Creatinine Clearance: 42.9 mL/min (by C-G formula based on SCr of 0.55 mg/dL). Liver Function Tests: Recent Labs  Lab 06/08/22 1109  AST 24  ALT 16  ALKPHOS 46  BILITOT 3.2*  PROT 5.7*  ALBUMIN 3.6   No results for input(s): "LIPASE", "AMYLASE" in the last 168 hours. No results for input(s): "AMMONIA" in the last 168 hours. Coagulation Profile: No results for input(s): "INR", "PROTIME" in the last 168 hours. Cardiac Enzymes: No results for input(s): "CKTOTAL", "CKMB", "CKMBINDEX", "TROPONINI" in the last 168 hours. BNP (last 3 results) No results for input(s): "PROBNP" in the last 8760 hours. HbA1C: No results for input(s): "HGBA1C" in the last 72 hours. CBG: No results  for input(s): "GLUCAP" in the last 168 hours. Lipid Profile: No results for input(s): "CHOL", "HDL", "LDLCALC", "TRIG", "CHOLHDL", "LDLDIRECT" in the last 72 hours. Thyroid Function Tests: Recent Labs    06/08/22 0219  TSH 2.572   Anemia Panel: No results for input(s): "VITAMINB12", "FOLATE", "FERRITIN", "TIBC", "IRON", "RETICCTPCT" in the last 72 hours. Sepsis Labs: No results for input(s): "PROCALCITON", "LATICACIDVEN" in the last 168 hours.  No results found for this or any previous visit (from the past 240 hour(s)).       Radiology Studies: No results found.      Scheduled Meds:  amLODipine  5 mg Oral Daily   levothyroxine  75 mcg Oral Q0600   ondansetron (ZOFRAN) IV  4 mg Intravenous Once   rosuvastatin  10 mg Oral Daily   sodium chloride  1 g Oral TID WC   Continuous Infusions:  Assessment & Plan:   Active Problems:   Hypertension   Hypercholesterolemia   Hyponatremia   NPH (normal pressure hydrocephalus) (HCC)   OSA (obstructive sleep apnea)   Hyponatremia Transfer to [facility]phrology following Likely cns adaptation .  Recently been treated for NPH at [facility] with lumbar puncture drain.  Has been having nausea for a while. 3% hypertonic solution started. Not a candidate for tolvaptan due to liver disease seen on abdominal ultrasound in February 2023 20 mg furosemide IV x1 Salt tablets 1 g 3 times daily and fluid restriction 8/19 Na up 126 this am.  Continue monitoring    NPH -recent admit to [facility]  -s/p external lumbar drainage of csf vial lumbar spinal catheter -patient not candidate for shunt  -prn ELD per d/c recs  8/19 patient will need to follow-up with outpatient neurologist on discharge       HTN Improved  with amlodipine continue   Hypothyroidism  Continue Synthroid       HLD  statins     OSA Does not tolerate CPAP     DVT prophylaxis: SCD Code Status: Full for Family Communication: None at  bedside Disposition Plan: Back home Status is: Inpatient Remains inpatient appropriate because: IV treatment.  Need to monitor sodium.      LOS: 2 days   Time spent: 35 minutes    Nolberto Hanlon, MD [facility] Pager [phone]  If 7PM-7AM, please contact night-coverage 06/10/2022, 9:02 AM

## 2022-06-10 NOTE — Plan of Care (Signed)
  Problem: Education: Goal: Knowledge of General Education information will improve Description Including pain rating scale, medication(s)/side effects and non-pharmacologic comfort measures Outcome: Progressing   Problem: Health Behavior/Discharge Planning: Goal: Ability to manage health-related needs will improve Outcome: Progressing   

## 2022-06-10 NOTE — Progress Notes (Signed)
Patient calm, cooperative and alert and oriented this shift.

## 2022-06-10 NOTE — Progress Notes (Signed)
[facility]  ROUNDING NOTE   Subjective:   Patient seen sitting up in bed, eating breakfast Alert and oriented Denies nausea or vomiting Denies headache or dizziness No lower extremity edema  Sodium 127 this morning  Objective:  Vital signs in last 24 hours:  Temp:  [97.7 F (36.5 C)-98.7 F (37.1 C)] 97.7 F (36.5 C) (08/19 1149) Pulse Rate:  [62-88] 88 (08/19 1149) Resp:  [16-19] 16 (08/19 1149) BP: (117-153)/(64-80) 117/67 (08/19 1149) SpO2:  [97 %-99 %] 97 % (08/19 1149) Weight:  [57.9 kg] 57.9 kg (08/19 0500)  Weight change: -1.17 kg Filed Weights   06/08/22 1200 06/08/22 2017 06/10/22 0500  Weight: 59.1 kg 58.6 kg 57.9 kg    Intake/Output: I/O last 3 completed shifts: In: 671.4 [P.O.:240; I.V.:431.4] Out: 2450 [Urine:2450]   Intake/Output this shift:  Total I/O In: 240 [P.O.:240] Out: -   Physical Exam: General: NAD, resting comfortably  Head: Normocephalic, atraumatic. Moist oral mucosal membranes  Eyes: Anicteric  Lungs:  Clear to auscultation, normal effort  Heart: Regular rate and rhythm  Abdomen:  Soft, nontender, nondistended  Extremities: No peripheral edema.  Neurologic: Nonfocal, moving all four extremities  Skin: No lesions    Basic Metabolic Panel: Recent Labs  Lab 06/07/22 1950 06/07/22 2317 06/08/22 0219 06/08/22 0600 06/08/22 1109 06/08/22 1603 06/09/22 0829 06/09/22 1039 06/09/22 1559 06/09/22 2004 06/10/22 0052 06/10/22 0457 06/10/22 0921  NA 116*   < > 127* 127* 127*   < > 121*   < > 122* 123* 125* 126* 127*  K 3.4*  --  3.7 3.3* 3.1*  --  3.6  --   --   --   --   --   --   CL 85*  --  99 99 97*  --  91*  --   --   --   --   --   --   CO2 21*  --  20* 22 22  --  22  --   --   --   --   --   --   GLUCOSE 169*  --  100* 104* 191*  --  93  --   --   --   --   --   --   BUN 14  --  '11 11 13  '$ --  10  --   --   --   --   --   --   CREATININE 0.61  --  0.59 0.57 0.68  --  0.55  --   --   --   --   --   --    CALCIUM 9.6  --  9.2 9.3 9.5  --  9.3  --   --   --   --   --   --   MG  --   --   --  1.9  --   --   --   --   --   --   --   --   --    < > = values in this interval not displayed.     Liver Function Tests: Recent Labs  Lab 06/08/22 1109  AST 24  ALT 16  ALKPHOS 46  BILITOT 3.2*  PROT 5.7*  ALBUMIN 3.6    No results for input(s): "LIPASE", "AMYLASE" in the last 168 hours. No results for input(s): "AMMONIA" in the last 168 hours.  CBC: Recent Labs  Lab 06/07/22 1950 06/08/22 1109  WBC 9.3 8.3  HGB 14.4 13.7  HCT 40.2 38.6  MCV 86.3 87.5  PLT 301 243     Cardiac Enzymes: No results for input(s): "CKTOTAL", "CKMB", "CKMBINDEX", "TROPONINI" in the last 168 hours.  BNP: Invalid input(s): "POCBNP"  CBG: No results for input(s): "GLUCAP" in the last 168 hours.  Microbiology: Results for orders placed or performed in visit on 11/25/21  Urine Culture     Status: Abnormal   Collection Time: 11/25/21 10:19 AM   Specimen: Urine  Result Value Ref Range Status   MICRO NUMBER: [id number]  Final   SPECIMEN QUALITY: Adequate  Final   Sample Source URINE  Final   STATUS: FINAL  Final   ISOLATE 1: Escherichia coli (A)  Final    Comment: Greater than 100,000 CFU/mL of Escherichia coli      Susceptibility   Escherichia coli - URINE CULTURE, REFLEX    AMOX/CLAVULANIC 4 Sensitive     AMPICILLIN >=32 Resistant     AMPICILLIN/SULBACTAM 16 Intermediate     CEFAZOLIN* <=4 Not Reportable      * For infections other than uncomplicated UTI caused by E. coli, K. pneumoniae or P. mirabilis: Cefazolin is resistant if MIC > or = 8 mcg/mL. (Distinguishing susceptible versus intermediate for isolates with MIC < or = 4 mcg/mL requires additional testing.) For uncomplicated UTI caused by E. coli, K. pneumoniae or P. mirabilis: Cefazolin is susceptible if MIC <32 mcg/mL and predicts susceptible to the oral agents cefaclor, cefdinir, cefpodoxime, cefprozil, cefuroxime,  cephalexin and loracarbef.     CEFTAZIDIME <=1 Sensitive     CEFEPIME <=1 Sensitive     CEFTRIAXONE <=1 Sensitive     CIPROFLOXACIN <=0.25 Sensitive     LEVOFLOXACIN <=0.12 Sensitive     GENTAMICIN <=1 Sensitive     IMIPENEM <=0.25 Sensitive     NITROFURANTOIN <=16 Sensitive     PIP/TAZO <=4 Sensitive     TOBRAMYCIN <=1 Sensitive     TRIMETH/SULFA* <=20 Sensitive      * For infections other than uncomplicated UTI caused by E. coli, K. pneumoniae or P. mirabilis: Cefazolin is resistant if MIC > or = 8 mcg/mL. (Distinguishing susceptible versus intermediate for isolates with MIC < or = 4 mcg/mL requires additional testing.) For uncomplicated UTI caused by E. coli, K. pneumoniae or P. mirabilis: Cefazolin is susceptible if MIC <32 mcg/mL and predicts susceptible to the oral agents cefaclor, cefdinir, cefpodoxime, cefprozil, cefuroxime, cephalexin and loracarbef. Legend: S = Susceptible  I = Intermediate R = Resistant  NS = Not susceptible * = Not tested  NR = Not reported **NN = See antimicrobic comments     Coagulation Studies: No results for input(s): "LABPROT", "INR" in the last 72 hours.  Urinalysis: Recent Labs    06/07/22 2317  COLORURINE STRAW*  LABSPEC 1.008  PHURINE 7.0  GLUCOSEU 50*  HGBUR MODERATE*  BILIRUBINUR NEGATIVE  KETONESUR NEGATIVE  PROTEINUR NEGATIVE  NITRITE NEGATIVE  LEUKOCYTESUR NEGATIVE       Imaging: No results found.   Medications:     amLODipine  5 mg Oral Daily   levothyroxine  75 mcg Oral Q0600   ondansetron (ZOFRAN) IV  4 mg Intravenous Once   rosuvastatin  10 mg Oral Daily   sodium chloride  1 g Oral TID WC   acetaminophen **OR** [DISCONTINUED] acetaminophen  Assessment/ Plan:  Crystal Lynch is a 81 y.o.  female  with past medical history consisting of hypertension, hypothyroidism, B12 deficiency, obstructive sleep  apnea, hyperlipidemia, NPH, who was admitted to [facility] on 06/07/2022 for Acute hyponatremia  [E87.1] Hypernatremia [E87.0] Hyponatremia [E87.1] Generalized weakness [R53.1]   Acute hyponatremia initially believed to be due to SNS adaptation however patient was recently treated for hydrocephalus at [facility] and received a lumbar drain.    Patient not a candidate for tolvaptan due to liver disease seen on abdominal ultrasound in February 2023.  Was given furosemide 20 mg IV once,  Now continued on salt tabs 1 g 3 times daily, and fluid restriction.   Na levels are improving slowly as expected. We will continue to monitor.   Magic cup protein supplementation. No indication for  3% hypertonic solution (with ICU transfer) at present.    2. Hypokalemia.  Corrected with oral supplementation.    LOS: 2 Shreyas Piatkowski Candiss Norse 8/19/20231:14 PM

## 2022-06-10 NOTE — Progress Notes (Signed)
Patient given magic cup.   Gerilyn Stargell, Tivis Ringer, RN

## 2022-06-10 NOTE — Evaluation (Signed)
Occupational Therapy Evaluation Patient Details Name: Crystal Lynch MRN: [id number] DOB: 1940-12-23 Today's Date: 06/10/2022   History of Present Illness [name] is a 81 y.o. female with past medical history consisting of hypertension, hypothyroidism, B12 deficiency, obstructive sleep apnea, hyperlipidemia, NPH, who was admitted to [facility] on 06/07/2022 for acute hyponatremia, generalized weakness, N/V, and headache. Patient notes no fever or chills, does note increased balance issues compared to baseline. She denies dysuria, cough, sob, tremors or abdominal pain.   Clinical Impression   Ms. Jurgensen presents with generalized weakness, limited endurance, fatigue, and impaired balance. Until last week, pt has been largely Mod I in BADL/IADL performance, walking primarily with a SPC, transitioning to RW ~ 1 month ago. She lives with her husband in a single-story house, 1 STE, and reporting 3 or 4 falls in previous 12 months. During today's evaluation, which follows external lumbar drainage of csf via lumbar spinal catheter 10 days ago, then N/V, severe HA, and hyponatremia, pt is now very weak, unsteady in standing, and requiring assistance for all ADL other than self-feeding. She denies pain today, saying that HA has now resolved. She is A&O x 4, provides appropriate short answers to most questions, while allowing her husband to fill in additional details. She requires Max A for standing and transferring, Mod A for bed mobility, substantial assistance to maintain standing balance with RW, unable to correct posterior lean with verbal and tactile cueing. Will provide ongoing OT while pt is hospitalized. Pt and spouse endorse their wish that pt can return directly home from hospital with [facility], but, following lengthy discussion, state that they understand pt may require SNF, given her current level of debility and significant difference from current and prior levels of fxl mobility. At this point have  to recommend SNF, but will reassess during next OT sessions and modify DC recommendations if pt shows improvement. Recommend speech consult focused on cognition/limiting memory and cognitive decline to the greatest extent possible.   Recommendations for follow up therapy are one component of a multi-disciplinary discharge planning process, led by the attending physician.  Recommendations may be updated based on patient status, additional functional criteria and insurance authorization.   Follow Up Recommendations  Skilled nursing-short term rehab (<3 hours/day)    Assistance Recommended at Discharge Frequent or constant Supervision/Assistance  Patient can return home with the following A lot of help with walking and/or transfers;A lot of help with bathing/dressing/bathroom;Assistance with cooking/housework;Direct supervision/assist for medications management;Assist for transportation;Help with stairs or ramp for entrance    Functional Status Assessment  Patient has had a recent decline in their functional status and demonstrates the ability to make significant improvements in function in a reasonable and predictable amount of time.  Equipment Recommendations  None recommended by OT    Recommendations for Other Services Speech consult (speech consult focused on cognitive issues)     Precautions / Restrictions Precautions Precautions: Fall Restrictions Weight Bearing Restrictions: No      Mobility Bed Mobility Overal bed mobility: Needs Assistance Bed Mobility: Sit to Supine       Sit to supine: Mod assist, Max assist   General bed mobility comments: Mod A for elevating LE into bed, Max A for repositioning towards HOB in supine    Transfers Overall transfer level: Needs assistance Equipment used: Rolling walker (2 wheels) Transfers: Sit to/from Stand, Bed to chair/wheelchair/BSC Sit to Stand: Max assist Stand pivot transfers: Max assist  Balance Overall  balance assessment: Needs assistance Sitting-balance support: Bilateral upper extremity supported Sitting balance-Leahy Scale: Fair   Postural control: Posterior lean Standing balance support: Bilateral upper extremity supported Standing balance-Leahy Scale: Poor                             ADL either performed or assessed with clinical judgement   ADL Overall ADL's : Needs assistance/impaired                     Lower Body Dressing: Minimal assistance Lower Body Dressing Details (indicate cue type and reason): w/ cueing, pt able to perform bridging in bed, to assist with donning underwear                     Vision         Perception     Praxis      Pertinent Vitals/Pain Pain Assessment Pain Assessment: No/denies pain     Hand Dominance Right   Extremity/Trunk Assessment Upper Extremity Assessment Upper Extremity Assessment: Generalized weakness   Lower Extremity Assessment Lower Extremity Assessment: Generalized weakness       Communication Communication Communication: No difficulties   Cognition Arousal/Alertness: Awake/alert Behavior During Therapy: WFL for tasks assessed/performed Overall Cognitive Status: Within Functional Limits for tasks assessed                                 General Comments: Pt is fatigued but appropriate. Generally oriented to situation, able to provide short descriptions of PLOF, home set-up, with husband filling in more details.     General Comments       Exercises Other Exercises Other Exercises: Lengthy discussion w/ pt and spouse re: POC, DC options.   Shoulder Instructions      Home Living Family/patient expects to be discharged to:: Private residence Living Arrangements: Spouse/significant other Available Help at Discharge: Family Type of Home: House Home Access: Stairs to enter [employer] of Steps: 1 + 1/2 Entrance Stairs-Rails: None Home Layout: One level      Bathroom Shower/Tub: Occupational psychologist: Handicapped height     Home Equipment: Conservation officer, nature (2 wheels);Cane - quad;Shower seat;Grab bars - tub/shower;Grab bars - toilet          Prior Functioning/Environment Prior Level of Function : Needs assist             Mobility Comments: Has been walking with occasional use of SPC, ~ 1 month ago began using RW. Reports 3-4 falls in previous 12 months. ADLs Comments: Reports generally Mod I in ADL, IADL, although with occasional SUPV for bathing and with seated showers. Limited cooking. Still performs houswork, Medical sales representative, Control and instrumentation engineer.        OT Problem List: Decreased strength;Decreased range of motion;Decreased activity tolerance;Impaired balance (sitting and/or standing);Decreased knowledge of use of DME or AE      OT Treatment/Interventions: Self-care/ADL training;Therapeutic exercise;Patient/family education;Balance training;Energy conservation;Therapeutic activities;DME and/or AE instruction    OT Goals(Current goals can be found in the care plan section) Acute Rehab OT Goals Patient Stated Goal: to go home OT Goal Formulation: With patient Time For Goal Achievement: 06/24/22 Potential to Achieve Goals: Good ADL Goals Pt Will Perform Grooming: with supervision;standing Pt Will Perform Upper Body Dressing: sitting;with supervision Pt Will Transfer to Toilet: with supervision;grab bars;stand pivot transfer;ambulating (using LRAD) Pt/caregiver will Perform  Home Exercise Program: Increased ROM;Increased strength  OT Frequency: Min 2X/week    Co-evaluation              AM-PAC OT "6 Clicks" Daily Activity     Outcome Measure Help from another person eating meals?: None Help from another person taking care of personal grooming?: A Little Help from another person toileting, which includes using toliet, bedpan, or urinal?: A Lot Help from another person bathing (including washing, rinsing, drying)?: A Lot Help  from another person to put on and taking off regular upper body clothing?: A Little Help from another person to put on and taking off regular lower body clothing?: A Lot 6 Click Score: 16   End of Session Equipment Utilized During Treatment: Rolling walker (2 wheels) Nurse Communication: Mobility status  Activity Tolerance: Patient limited by fatigue;Patient tolerated treatment well Patient left: in bed;with call bell/phone within reach;with bed alarm set;with nursing/sitter in room;with family/visitor present  OT Visit Diagnosis: Muscle weakness (generalized) (M62.81);Unsteadiness on feet (R26.81)                Time: 5686-1683 OT Time Calculation (min): 51 min Charges:  OT General Charges $OT Visit: 1 Visit OT Evaluation $OT Eval Moderate Complexity: 1 Mod OT Treatments $Self Care/Home Management : 38-52 mins Josiah Lobo, PhD, MS, OTR/L 06/10/22, 4:13 PM

## 2022-06-11 DIAGNOSIS — E870 Hyperosmolality and hypernatremia: Secondary | ICD-10-CM | POA: Diagnosis not present

## 2022-06-11 LAB — RENAL FUNCTION PANEL
Albumin: 3.7 g/dL (ref 3.5–5.0)
Anion gap: 8 (ref 5–15)
BUN: 14 mg/dL (ref 8–23)
CO2: 25 mmol/L (ref 22–32)
Calcium: 9.6 mg/dL (ref 8.9–10.3)
Chloride: 98 mmol/L (ref 98–111)
Creatinine, Ser: 0.72 mg/dL (ref 0.44–1.00)
GFR, Estimated: >60 mL/min (ref >60)
Glucose, Bld: 120 mg/dL — ABNORMAL HIGH (ref 70–99)
Phosphorus: 2.1 mg/dL — ABNORMAL LOW (ref 2.5–4.6)
Potassium: 3.6 mmol/L (ref 3.5–5.1)
Sodium: 131 mmol/L — ABNORMAL LOW (ref 135–145)

## 2022-06-11 LAB — URINALYSIS, COMPLETE (UACMP) WITH MICROSCOPIC
Bilirubin Urine: NEGATIVE
Glucose, UA: NEGATIVE mg/dL
Ketones, ur: NEGATIVE mg/dL
Leukocytes,Ua: NEGATIVE
Nitrite: NEGATIVE
Protein, ur: NEGATIVE mg/dL
Specific Gravity, Urine: 1.012 (ref 1.005–1.030)
pH: 6 (ref 5.0–8.0)

## 2022-06-11 LAB — SODIUM
Sodium: 129 mmol/L — ABNORMAL LOW (ref 135–145)
Sodium: 130 mmol/L — ABNORMAL LOW (ref 135–145)

## 2022-06-11 MED ORDER — K PHOS MONO-SOD PHOS DI & MONO 155-852-130 MG PO TABS
250.0000 mg | ORAL_TABLET | Freq: Once | ORAL | Status: AC
  Administered 2022-06-11: 250 mg via ORAL
  Filled 2022-06-11: qty 1

## 2022-06-11 NOTE — Progress Notes (Signed)
[facility]  ROUNDING NOTE   Subjective:   Doing well today.  Sitting up in bed. States she is able to eat without nausea or vomiting Sodium increased to 131 this morning and then down to 130. Urinalysis with specific gravity of 1.012. No RBCs or WBCs.  Objective:  Vital signs in last 24 hours:  Temp:  [98 F (36.7 C)-98.3 F (36.8 C)] 98 F (36.7 C) (08/20 0802) Pulse Rate:  [82-97] 97 (08/20 1217) Resp:  [17-20] 17 (08/20 0802) BP: (129-157)/(59-73) 139/59 (08/20 1217) SpO2:  [96 %-98 %] 98 % (08/20 1217) Weight:  [58 kg] 58 kg (08/20 0233)  Weight change: 0.051 kg Filed Weights   06/08/22 2017 06/10/22 0500 06/11/22 0233  Weight: 58.6 kg 57.9 kg 58 kg    Intake/Output: I/O last 3 completed shifts: In: 78 [P.O.:720] Out: 1500 [Urine:1500]   Intake/Output this shift:  Total I/O In: -  Out: 250 [Urine:250]  Physical Exam: General: NAD, resting comfortably  Head: Normocephalic, atraumatic. Moist oral mucosal membranes  Eyes: Anicteric  Lungs:  Clear to auscultation, normal effort  Heart: Regular rate and rhythm  Abdomen:  Soft, nontender, nondistended  Extremities: No peripheral edema.  Neurologic: Nonfocal, moving all four extremities  Skin: No lesions    Basic Metabolic Panel: Recent Labs  Lab 06/08/22 0219 06/08/22 0600 06/08/22 1109 06/08/22 1603 06/09/22 0829 06/09/22 1039 06/10/22 1204 06/10/22 1604 06/10/22 1943 06/11/22 0008 06/11/22 0426 06/11/22 0838  NA 127* 127* 127*   < > 121*   < > 127* 128* 128* 129* 131* 130*  K 3.7 3.3* 3.1*  --  3.6  --  3.2*  --   --   --  3.6  --   CL 99 99 97*  --  91*  --   --   --   --   --  98  --   CO2 20* 22 22  --  22  --   --   --   --   --  25  --   GLUCOSE 100* 104* 191*  --  93  --   --   --   --   --  120*  --   BUN '11 11 13  '$ --  10  --   --   --   --   --  14  --   CREATININE 0.59 0.57 0.68  --  0.55  --  0.72  --   --   --  0.72  --   CALCIUM 9.2 9.3 9.5  --  9.3  --   --   --    --   --  9.6  --   MG  --  1.9  --   --   --   --   --   --   --   --   --   --   PHOS  --   --   --   --   --   --   --   --   --   --  2.1*  --    < > = values in this interval not displayed.     Liver Function Tests: Recent Labs  Lab 06/08/22 1109 06/11/22 0426  AST 24  --   ALT 16  --   ALKPHOS 46  --   BILITOT 3.2*  --   PROT 5.7*  --   ALBUMIN 3.6 3.7    No results  for input(s): "LIPASE", "AMYLASE" in the last 168 hours. No results for input(s): "AMMONIA" in the last 168 hours.  CBC: Recent Labs  Lab 06/07/22 1950 06/08/22 1109  WBC 9.3 8.3  HGB 14.4 13.7  HCT 40.2 38.6  MCV 86.3 87.5  PLT 301 243     Cardiac Enzymes: No results for input(s): "CKTOTAL", "CKMB", "CKMBINDEX", "TROPONINI" in the last 168 hours.  BNP: Invalid input(s): "POCBNP"  CBG: No results for input(s): "GLUCAP" in the last 168 hours.  Microbiology: Results for orders placed or performed in visit on 11/25/21  Urine Culture     Status: Abnormal   Collection Time: 11/25/21 10:19 AM   Specimen: Urine  Result Value Ref Range Status   MICRO NUMBER: [id number]  Final   SPECIMEN QUALITY: Adequate  Final   Sample Source URINE  Final   STATUS: FINAL  Final   ISOLATE 1: Escherichia coli (A)  Final    Comment: Greater than 100,000 CFU/mL of Escherichia coli      Susceptibility   Escherichia coli - URINE CULTURE, REFLEX    AMOX/CLAVULANIC 4 Sensitive     AMPICILLIN >=32 Resistant     AMPICILLIN/SULBACTAM 16 Intermediate     CEFAZOLIN* <=4 Not Reportable      * For infections other than uncomplicated UTI caused by E. coli, K. pneumoniae or P. mirabilis: Cefazolin is resistant if MIC > or = 8 mcg/mL. (Distinguishing susceptible versus intermediate for isolates with MIC < or = 4 mcg/mL requires additional testing.) For uncomplicated UTI caused by E. coli, K. pneumoniae or P. mirabilis: Cefazolin is susceptible if MIC <32 mcg/mL and predicts susceptible to the oral agents cefaclor,  cefdinir, cefpodoxime, cefprozil, cefuroxime, cephalexin and loracarbef.     CEFTAZIDIME <=1 Sensitive     CEFEPIME <=1 Sensitive     CEFTRIAXONE <=1 Sensitive     CIPROFLOXACIN <=0.25 Sensitive     LEVOFLOXACIN <=0.12 Sensitive     GENTAMICIN <=1 Sensitive     IMIPENEM <=0.25 Sensitive     NITROFURANTOIN <=16 Sensitive     PIP/TAZO <=4 Sensitive     TOBRAMYCIN <=1 Sensitive     TRIMETH/SULFA* <=20 Sensitive      * For infections other than uncomplicated UTI caused by E. coli, K. pneumoniae or P. mirabilis: Cefazolin is resistant if MIC > or = 8 mcg/mL. (Distinguishing susceptible versus intermediate for isolates with MIC < or = 4 mcg/mL requires additional testing.) For uncomplicated UTI caused by E. coli, K. pneumoniae or P. mirabilis: Cefazolin is susceptible if MIC <32 mcg/mL and predicts susceptible to the oral agents cefaclor, cefdinir, cefpodoxime, cefprozil, cefuroxime, cephalexin and loracarbef. Legend: S = Susceptible  I = Intermediate R = Resistant  NS = Not susceptible * = Not tested  NR = Not reported **NN = See antimicrobic comments     Coagulation Studies: No results for input(s): "LABPROT", "INR" in the last 72 hours.  Urinalysis: Recent Labs    06/11/22 0756  COLORURINE YELLOW*  LABSPEC 1.012  PHURINE 6.0  GLUCOSEU NEGATIVE  HGBUR MODERATE*  BILIRUBINUR NEGATIVE  KETONESUR NEGATIVE  PROTEINUR NEGATIVE  NITRITE NEGATIVE  LEUKOCYTESUR NEGATIVE       Imaging: No results found.   Medications:     amLODipine  5 mg Oral Daily   levothyroxine  75 mcg Oral Q0600   ondansetron (ZOFRAN) IV  4 mg Intravenous Once   rosuvastatin  10 mg Oral Daily   sodium chloride  1 g Oral TID WC   acetaminophen **  OR** [DISCONTINUED] acetaminophen  Assessment/ Plan:  Ms. Abbagale Goguen is a 81 y.o.  female  with past medical history consisting of hypertension, hypothyroidism, B12 deficiency, obstructive sleep apnea, hyperlipidemia, NPH, who was admitted  to [facility] on 06/07/2022 for Acute hyponatremia [E87.1] Hypernatremia [E87.0] Hyponatremia [E87.1] Generalized weakness [R53.1]   Acute hyponatremia initially believed to be due to SNS adaptation however patient was recently treated for hydrocephalus at [facility] and received a lumbar drain.  Liver disease could also contribute to hyponatremia.   Patient not a candidate for tolvaptan due to liver disease seen on abdominal ultrasound in February 2023.  Was given furosemide 20 mg IV once,  Now continued on salt tabs 1 g 3 times daily, and fluid restriction 1200 ml/d.   Na levels are improving slowly as expected. We will continue to monitor.  This may be her new baseline. Magic cup protein supplementation. No indication for 3% hypertonic solution (with ICU transfer) at present.    2. Hypokalemia.  Corrected with oral supplementation.    LOS: [place] 8/20/20231:45 PM

## 2022-06-11 NOTE — Plan of Care (Signed)
  Problem: Education: Goal: Knowledge of General Education information will improve Description Including pain rating scale, medication(s)/side effects and non-pharmacologic comfort measures Outcome: Progressing   Problem: Health Behavior/Discharge Planning: Goal: Ability to manage health-related needs will improve Outcome: Progressing   

## 2022-06-11 NOTE — Evaluation (Signed)
Physical Therapy Evaluation Patient Details Name: Crystal Lynch MRN: [id number] DOB: Jan 29, 1941 Today's Date: 06/11/2022  History of Present Illness  Crystal Lynch is a 81 y.o. female with past medical history consisting of hypertension, hypothyroidism, B12 deficiency, obstructive sleep apnea, hyperlipidemia, NPH, who was admitted to [facility] on 06/07/2022 for acute hyponatremia, generalized weakness, N/V, and headache. Patient notes no fever or chills, does note increased balance issues compared to baseline. She denies dysuria, cough, sob, tremors or abdominal pain.    Clinical Impression  Pt received in Semi-Fowler's position and agreeable to therapy.  Pt notes that she is extremely fatigued and does not really want to get up, but with encouragement from therapist and husband, agrees.  Pt performed well with mobility to EOB, but has decreased trunk strength in order to sit upright.  Pt has significant posterior bias and at times needs to be assisted in sitting to remain in upright position.  Pt is able to perform STS x12, but fatigues as STS are continued.  Pt continued to have posterior bias in standing as well and even with maximal VC's, pt is unable to fully comprehend or have the proprioception to fix posture.  Pt would likely have fallen in bed multiple times without therapist assistance in preventing uncontrolled descent.  Pt did attempt to take a few steps, but has too great of a posterior lean to continue safely.  Pt transitioned back to the bed and was left with all needs met at this time.  Current discharge plans to SNF are appropriate at this time.  Pt will continue to benefit from skilled therapy in order to address deficits listed below.      Recommendations for follow up therapy are one component of a multi-disciplinary discharge planning process, led by the attending physician.  Recommendations may be updated based on patient status, additional functional criteria and insurance  authorization.  Follow Up Recommendations Skilled nursing-short term rehab (<3 hours/day) Can patient physically be transported by private vehicle: No    Assistance Recommended at Discharge Frequent or constant Supervision/Assistance  Patient can return home with the following  A lot of help with walking and/or transfers;A lot of help with bathing/dressing/bathroom;Help with stairs or ramp for entrance    Equipment Recommendations None recommended by PT  Recommendations for Other Services       Functional Status Assessment Patient has had a recent decline in their functional status and demonstrates the ability to make significant improvements in function in a reasonable and predictable amount of time.     Precautions / Restrictions Precautions Precautions: Fall Restrictions Weight Bearing Restrictions: No      Mobility  Bed Mobility Overal bed mobility: Needs Assistance Bed Mobility: Sit to Supine       Sit to supine: Mod assist   General bed mobility comments: Mod A for elevating LE into bed    Transfers Overall transfer level: Needs assistance Equipment used: Rolling walker (2 wheels) Transfers: Sit to/from Stand Sit to Stand: Min guard, Min assist           General transfer comment: Pt requires constant verbal cuing in order to perform STS's, but has good strength in doing so.  Pt did fatigue towards the end, but was able to perform x15    Ambulation/Gait               General Gait Details: deferred  Science writer  Modified Rankin (Stroke Patients Only)       Balance Overall balance assessment: Needs assistance Sitting-balance support: Bilateral upper extremity supported Sitting balance-Leahy Scale: Fair   Postural control: Posterior lean Standing balance support: Bilateral upper extremity supported Standing balance-Leahy Scale: Poor Standing balance comment: heavy posterior bias                              Pertinent Vitals/Pain Pain Assessment Pain Assessment: No/denies pain    Home Living Family/patient expects to be discharged to:: Private residence Living Arrangements: Spouse/significant other Available Help at Discharge: Family Type of Home: House Home Access: Stairs to enter Entrance Stairs-Rails: None Entrance Stairs-Number of Steps: 1 + 1/2   Home Layout: One level Home Equipment: Conservation officer, nature (2 wheels);Cane - quad;Shower seat;Grab bars - tub/shower;Grab bars - toilet      Prior Function Prior Level of Function : Needs assist             Mobility Comments: Has been walking with occasional use of SPC, ~ 1 month ago began using RW. Reports 3-4 falls in previous 12 months. ADLs Comments: Reports generally Mod I in ADL, IADL, although with occasional SUPV for bathing and with seated showers. Limited cooking. Still performs houswork, Medical sales representative, Control and instrumentation engineer.     Hand Dominance   Dominant Hand: Right    Extremity/Trunk Assessment   Upper Extremity Assessment Upper Extremity Assessment: Generalized weakness    Lower Extremity Assessment Lower Extremity Assessment: Generalized weakness       Communication   Communication: No difficulties  Cognition Arousal/Alertness: Awake/alert Behavior During Therapy: WFL for tasks assessed/performed Overall Cognitive Status: Within Functional Limits for tasks assessed                                 General Comments: Pt is fatigued but appropriate. Generally oriented to situation, able to provide short descriptions of PLOF, home set-up, with husband filling in more details.        General Comments      Exercises     Assessment/Plan    PT Assessment Patient needs continued PT services  PT Problem List Decreased strength;Decreased activity tolerance;Decreased balance;Decreased mobility;Decreased cognition;Decreased knowledge of use of DME;Decreased safety awareness       PT Treatment  Interventions DME instruction;Gait training;Stair training;Functional mobility training;Therapeutic activities;Therapeutic exercise;Balance training;Neuromuscular re-education    PT Goals (Current goals can be found in the Care Plan section)  Acute Rehab PT Goals Patient Stated Goal: to go home. PT Goal Formulation: With patient Time For Goal Achievement: 06/25/22 Potential to Achieve Goals: Good    Frequency Min 2X/week     Co-evaluation               AM-PAC PT "6 Clicks" Mobility  Outcome Measure Help needed turning from your back to your side while in a flat bed without using bedrails?: A Little Help needed moving from lying on your back to sitting on the side of a flat bed without using bedrails?: A Little Help needed moving to and from a bed to a chair (including a wheelchair)?: A Lot Help needed standing up from a chair using your arms (e.g., wheelchair or bedside chair)?: A Lot Help needed to walk in hospital room?: A Lot Help needed climbing 3-5 steps with a railing? : Total 6 Click Score: 13    End of Session   Activity  Tolerance: Patient limited by fatigue Patient left: in bed;with call bell/phone within reach;with bed alarm set;with family/visitor present Nurse Communication: Mobility status PT Visit Diagnosis: Unsteadiness on feet (R26.81);Other abnormalities of gait and mobility (R26.89);Repeated falls (R29.6);Muscle weakness (generalized) (M62.81);History of falling (Z91.81);Difficulty in walking, not elsewhere classified (R26.2)    Time: 1040-1109 PT Time Calculation (min) (ACUTE ONLY): 29 min   Charges:   PT Evaluation $PT Eval Low Complexity: 1 Low PT Treatments $Therapeutic Exercise: 23-37 mins        Gwenlyn Saran, PT, DPT 06/11/22, 2:48 PM

## 2022-06-11 NOTE — Progress Notes (Signed)
PROGRESS NOTE    Crystal Lynch  KGM:[id number] DOB: August 28, 1941 DOA: 06/07/2022 PCP: Einar Pheasant, MD    Brief Narrative:   Crystal Lynch is a 81 y.o. female with medical history significant of  NPH, HTN, hypothyroidism, HLD,OSA, B12 def who has interim history of admission to [facility] for further evaluation of NPH and external lumbar drainage of csf vial lumbar spinal catheter. Per patient s/p evaluation she was noted not to qualify for shunt placement.  Patient states s/p procedure she had HA/Nausea.She states that  she was treated symptomatically, however now at home close to two weeks and has had worsening of symptoms of HA and nausea over the last three days. Due to worsening of symptoms patient presents to ED. Patient notes no, fever chills, does note increase balance issues compared to baseline. She denies dysuria, cough, sob , tremors or abdominal pain.     ED Course:  In ED on evaluation patient was fund to have  hyponatremia with NA of 116 down from base of 136.  Nephrology was called who recommended admission to SDU and starting on 3% saline.     8/18 119 this am. Being transferred to [facility] 8/19 feels better today, just feels weak.  Sodium 126 this a.m. 8/20 OT recommends SNF.  PT pending  Consultants:  nephrology  Procedures:   Antimicrobials:      Subjective: Has no new complaints.  No shortness of breath, chest pain or abdominal pain  Objective: Vitals:   06/10/22 2335 06/11/22 0233 06/11/22 0411 06/11/22 0802  BP: 134/70  132/70 (!) 157/73  Pulse: 82  89 83  Resp: '17  20 17  '$ Temp: 98.1 F (36.7 C)  98.1 F (36.7 C) 98 F (36.7 C)  TempSrc: Oral  Axillary   SpO2: 97%  98% 97%  Weight:  58 kg    Height:        Intake/Output Summary (Last 24 hours) at 06/11/2022 1119 Last data filed at 06/11/2022 0805 Gross per 24 hour  Intake 480 ml  Output 650 ml  Net -170 ml   Filed Weights   06/08/22 2017 06/10/22 0500 06/11/22 0233  Weight: 58.6 kg  57.9 kg 58 kg    Examination: Calm, NAD Cta no w/r Reg s1/s2 no gallop Soft benign +bs No edema Grossly intact Mood and affect appropriate in current setting     Data Reviewed: I have personally reviewed following labs and imaging studies  CBC: Recent Labs  Lab 06/07/22 1950 06/08/22 1109  WBC 9.3 8.3  HGB 14.4 13.7  HCT 40.2 38.6  MCV 86.3 87.5  PLT 301 644   Basic Metabolic Panel: Recent Labs  Lab 06/08/22 0219 06/08/22 0600 06/08/22 1109 06/08/22 1603 06/09/22 0829 06/09/22 1039 06/10/22 1204 06/10/22 1604 06/10/22 1943 06/11/22 0008 06/11/22 0426 06/11/22 0838  NA 127* 127* 127*   < > 121*   < > 127* 128* 128* 129* 131* 130*  K 3.7 3.3* 3.1*  --  3.6  --  3.2*  --   --   --  3.6  --   CL 99 99 97*  --  91*  --   --   --   --   --  98  --   CO2 20* 22 22  --  22  --   --   --   --   --  25  --   GLUCOSE 100* 104* 191*  --  93  --   --   --   --   --  120*  --   BUN '11 11 13  '$ --  10  --   --   --   --   --  14  --   CREATININE 0.59 0.57 0.68  --  0.55  --  0.72  --   --   --  0.72  --   CALCIUM 9.2 9.3 9.5  --  9.3  --   --   --   --   --  9.6  --   MG  --  1.9  --   --   --   --   --   --   --   --   --   --   PHOS  --   --   --   --   --   --   --   --   --   --  2.1*  --    < > = values in this interval not displayed.   GFR: Estimated Creatinine Clearance: 42.9 mL/min (by C-G formula based on SCr of 0.72 mg/dL). Liver Function Tests: Recent Labs  Lab 06/08/22 1109 06/11/22 0426  AST 24  --   ALT 16  --   ALKPHOS 46  --   BILITOT 3.2*  --   PROT 5.7*  --   ALBUMIN 3.6 3.7   No results for input(s): "LIPASE", "AMYLASE" in the last 168 hours. No results for input(s): "AMMONIA" in the last 168 hours. Coagulation Profile: No results for input(s): "INR", "PROTIME" in the last 168 hours. Cardiac Enzymes: No results for input(s): "CKTOTAL", "CKMB", "CKMBINDEX", "TROPONINI" in the last 168 hours. BNP (last 3 results) No results for input(s):  "PROBNP" in the last 8760 hours. HbA1C: No results for input(s): "HGBA1C" in the last 72 hours. CBG: No results for input(s): "GLUCAP" in the last 168 hours. Lipid Profile: No results for input(s): "CHOL", "HDL", "LDLCALC", "TRIG", "CHOLHDL", "LDLDIRECT" in the last 72 hours. Thyroid Function Tests: No results for input(s): "TSH", "T4TOTAL", "FREET4", "T3FREE", "THYROIDAB" in the last 72 hours.  Anemia Panel: No results for input(s): "VITAMINB12", "FOLATE", "FERRITIN", "TIBC", "IRON", "RETICCTPCT" in the last 72 hours. Sepsis Labs: No results for input(s): "PROCALCITON", "LATICACIDVEN" in the last 168 hours.  No results found for this or any previous visit (from the past 240 hour(s)).       Radiology Studies: No results found.      Scheduled Meds:  amLODipine  5 mg Oral Daily   levothyroxine  75 mcg Oral Q0600   ondansetron (ZOFRAN) IV  4 mg Intravenous Once   phosphorus  250 mg Oral Once   rosuvastatin  10 mg Oral Daily   sodium chloride  1 g Oral TID WC   Continuous Infusions:  Assessment & Plan:   Active Problems:   Hypertension   Hypercholesterolemia   Hyponatremia   NPH (normal pressure hydrocephalus) (HCC)   OSA (obstructive sleep apnea)   Hypophosphatemia   Hyponatremia Transfer to [facility] Nephrology following Likely cns adaptation .  Recently been treated for NPH at [facility] with lumbar puncture drain.  Has been having nausea for a while. 3% hypertonic solution started. Not a candidate for tolvaptan due to liver disease seen on abdominal ultrasound in February 2023 20 mg furosemide IV x1 Salt tablets 1 g 3 times daily and fluid restriction 8/20 sodium 130  Spoke to nephrology, rec. 1200 fluid restriction and sodium tablets     Hypophosphatemia Phosphorus 2.1 We will replace   NPH -  recent admit to [facility]  -s/p external lumbar drainage of csf vial lumbar spinal catheter -patient not candidate for shunt  -prn ELD per d/c recs  8/19  patient will need to follow-up with outpatient neurologist on discharge       HTN  Continue amlodipine      Hypothyroidism  Continue Synthroid       HLD  statins     OSA Does not tolerate CPAP     DVT prophylaxis: SCD Code Status: Full for Family Communication: None at bedside Disposition Plan: SNF Status is: Inpatient Remains inpatient appropriate because: IV treatment.  Need to monitor sodium.      LOS: 3 days   Time spent: 35 minutes    Nolberto Hanlon, MD [facility] Pager [phone]  If 7PM-7AM, please contact night-coverage 06/11/2022, 11:19 AM

## 2022-06-11 NOTE — Progress Notes (Signed)
Patient's last sodium value was 131 on 08/20 at  4:30 am.   Urinalysis not collected at 5 am. Patient had an unmeasured output occurrence earlier in shift and sample was not collected. She has not had any urinary output since then. Will inform oncoming nurse to collect sample.

## 2022-06-12 ENCOUNTER — Ambulatory Visit: Payer: Medicare Other | Admitting: Physical Therapy

## 2022-06-12 DIAGNOSIS — E870 Hyperosmolality and hypernatremia: Secondary | ICD-10-CM | POA: Diagnosis not present

## 2022-06-12 LAB — SODIUM: Sodium: 132 mmol/L — ABNORMAL LOW (ref 135–145)

## 2022-06-12 LAB — POTASSIUM: Potassium: 3.6 mmol/L (ref 3.5–5.1)

## 2022-06-12 LAB — PHOSPHORUS: Phosphorus: 3.4 mg/dL (ref 2.5–4.6)

## 2022-06-12 MED ORDER — GUAIFENESIN-DM 100-10 MG/5ML PO SYRP
5.0000 mL | ORAL_SOLUTION | ORAL | Status: DC | PRN
  Administered 2022-06-12 – 2022-06-13 (×4): 5 mL via ORAL
  Filled 2022-06-12 (×4): qty 10

## 2022-06-12 NOTE — Progress Notes (Signed)
Physical Therapy Treatment Patient Details Name: Crystal Lynch MRN: [id number] DOB: 1941-04-22 Today's Date: 06/12/2022   History of Present Illness [name] is a 81 y.o. Lynch with past medical history consisting of hypertension, hypothyroidism, B12 deficiency, obstructive sleep apnea, hyperlipidemia, NPH, who was admitted to [facility] on 06/07/2022 for acute hyponatremia, generalized weakness, N/V, and headache. Patient notes no fever or chills, does note increased balance issues compared to baseline. She denies dysuria, cough, sob, tremors or abdominal pain.    PT Comments    Pt received seated in recliner upon arrival to room and pt agreeable to therapy.  Pt participated well with therapy, noting an increase in strength and ability to ambulate compared to yesterday's session.  Pt still needs consistent verbal cuing for correct posture to prevent posterior bias.  Pt also needing conssitent support to prevent posterior fall throughout ambulation.  Pt assisted back into recliner for OT session following PT treatment.  Current discharge plans to SNF remain appropriate at this time.  Pt will continue to benefit from skilled therapy in order to address deficits listed below.       Recommendations for follow up therapy are one component of a multi-disciplinary discharge planning process, led by the attending physician.  Recommendations may be updated based on patient status, additional functional criteria and insurance authorization.  Follow Up Recommendations  Skilled nursing-short term rehab (<3 hours/day) Can patient physically be transported by private vehicle: No   Assistance Recommended at Discharge Frequent or constant Supervision/Assistance  Patient can return home with the following A lot of help with walking and/or transfers;A lot of help with bathing/dressing/bathroom;Help with stairs or ramp for entrance   Equipment Recommendations  None recommended by PT     Recommendations for Other Services       Precautions / Restrictions Precautions Precautions: Fall Precaution Comments: very fearful of falling as well Restrictions Weight Bearing Restrictions: No     Mobility  Bed Mobility               General bed mobility comments: Pt upright in recliner at beginning/end of therapy session.    Transfers Overall transfer level: Needs assistance Equipment used: Rolling walker (2 wheels) Transfers: Sit to/from Stand Sit to Stand: Min guard, Min assist           General transfer comment: Pt needs consistent direction in order to compelte STS    Ambulation/Gait Ambulation/Gait assistance: Mod assist Gait Distance (Feet): 16 Feet Assistive device: Rolling walker (2 wheels) Gait Pattern/deviations: Step-to pattern, Steppage, Shuffle, Scissoring, Leaning posteriorly, Trunk flexed, Narrow base of support Gait velocity: decreased     General Gait Details: Pt has significant deviations in gait pattern that is difficult to correct due to pt not being able to remember instructions   Stairs             Wheelchair Mobility    Modified Rankin (Stroke Patients Only)       Balance Overall balance assessment: Needs assistance Sitting-balance support: No upper extremity supported, Feet supported Sitting balance-Leahy Scale: Good   Postural control: Posterior lean Standing balance support: Bilateral upper extremity supported Standing balance-Leahy Scale: Poor Standing balance comment: heavy posterior bias                            Cognition Arousal/Alertness: Awake/alert Behavior During Therapy: WFL for tasks assessed/performed Overall Cognitive Status: Within Functional Limits for tasks assessed  General Comments: grossly [facility]        Exercises      General Comments        Pertinent Vitals/Pain Pain Assessment Pain Assessment: No/denies pain    Home  Living                          Prior Function            PT Goals (current goals can now be found in the care plan section) Acute Rehab PT Goals Patient Stated Goal: to go home. PT Goal Formulation: With patient Time For Goal Achievement: 06/25/22 Potential to Achieve Goals: Good Progress towards PT goals: Progressing toward goals    Frequency    Min 2X/week      PT Plan Current plan remains appropriate    Co-evaluation              AM-PAC PT "6 Clicks" Mobility   Outcome Measure  Help needed turning from your back to your side while in a flat bed without using bedrails?: A Little Help needed moving from lying on your back to sitting on the side of a flat bed without using bedrails?: A Little Help needed moving to and from a bed to a chair (including a wheelchair)?: A Lot Help needed standing up from a chair using your arms (e.g., wheelchair or bedside chair)?: A Lot Help needed to walk in hospital room?: A Lot Help needed climbing 3-5 steps with a railing? : Total 6 Click Score: 13    End of Session Equipment Utilized During Treatment: Gait belt Activity Tolerance: Patient limited by fatigue Patient left: with call bell/phone within reach;in chair (with OT arriving.) Nurse Communication: Mobility status PT Visit Diagnosis: Unsteadiness on feet (R26.81);Other abnormalities of gait and mobility (R26.89);Repeated falls (R29.6);Muscle weakness (generalized) (M62.81);History of falling (Z91.81);Difficulty in walking, not elsewhere classified (R26.2)     Time: 7209-4709 PT Time Calculation (min) (ACUTE ONLY): 39 min  Charges:  $Gait Training: 38-52 mins                     Gwenlyn Saran, PT, DPT 06/12/22, 5:44 PM

## 2022-06-12 NOTE — Progress Notes (Signed)
Occupational Therapy Treatment Patient Details Name: Crystal Lynch MRN: [id number] DOB: Aug 19, 1941 Today's Date: 06/12/2022   History of present illness [name] is a 81 y.o. female with past medical history consisting of hypertension, hypothyroidism, B12 deficiency, obstructive sleep apnea, hyperlipidemia, NPH, who was admitted to [facility] on 06/07/2022 for acute hyponatremia, generalized weakness, N/V, and headache. Patient notes no fever or chills, does note increased balance issues compared to baseline. She denies dysuria, cough, sob, tremors or abdominal pain.   OT comments  Pt seen for OT tx immediately following PT session. Pt/family agreeable. Pt ambulating back to recliner with PT upon OT's arrival. Noting decreased balance and requiring cues for RW mgt and balance/safety. Pt endorses 5/10 perceived rate of exertion following PT session. Pt completed additional grooming tasks with set up and supervision from seated position and some intermittent back support from recliner. Pt/family educated in ADL transfer training to limit posterior bias/LOB and improve safety/confidence as well as educated in benefits of getting to [facility] versus using purewick as appropriate to increase mobility opportunities wiht staff throughout the day. RN notified. Pt progressing towards goals and continues to benefit from skilled OT Services. Continue to recommend SNF at this time.    Recommendations for follow up therapy are one component of a multi-disciplinary discharge planning process, led by the attending physician.  Recommendations may be updated based on patient status, additional functional criteria and insurance authorization.    Follow Up Recommendations  Skilled nursing-short term rehab (<3 hours/day)    Assistance Recommended at Discharge Frequent or constant Supervision/Assistance  Patient can return home with the following  A lot of help with walking and/or transfers;A lot of help with  bathing/dressing/bathroom;Assistance with cooking/housework;Direct supervision/assist for medications management;Assist for transportation;Help with stairs or ramp for entrance   Equipment Recommendations  None recommended by OT    Recommendations for Other Services      Precautions / Restrictions Precautions Precautions: Fall Precaution Comments: very fearful of falling as well Restrictions Weight Bearing Restrictions: No       Mobility Bed Mobility               General bed mobility comments: NT, up in recliner after PT session    Transfers                   General transfer comment: deferred 2/2 fatigue and post-ambulation with PT just prior to OT     Balance Overall balance assessment: Needs assistance Sitting-balance support: No upper extremity supported, Feet supported Sitting balance-Leahy Scale: Good                                     ADL either performed or assessed with clinical judgement   ADL Overall ADL's : Needs assistance/impaired     Grooming: Sitting;Supervision/safety;Set up;Wash/dry face;Wash/dry hands;Oral care;Brushing hair                                      Extremity/Trunk Assessment              Vision       Perception     Praxis      Cognition Arousal/Alertness: Awake/alert Behavior During Therapy: WFL for tasks assessed/performed Overall Cognitive Status: Within Functional Limits for tasks assessed  General Comments: grossly WFL        Exercises Other Exercises Other Exercises: Pt/family educated in ADL transfer training to limit posterior bias/LOB and improve safety/confidence as well as educated in benefits of getting to [facility] versus using purewick as appropriate to increase mobility opportunities wiht staff throughout the day. RN notified.    Shoulder Instructions       General Comments      Pertinent Vitals/ Pain        Pain Assessment Pain Assessment: No/denies pain  Home Living                                          Prior Functioning/Environment              Frequency  Min 2X/week        Progress Toward Goals  OT Goals(current goals can now be found in the care plan section)  Progress towards OT goals: Progressing toward goals  Acute Rehab OT Goals Patient Stated Goal: to go home OT Goal Formulation: With patient Time For Goal Achievement: 06/24/22 Potential to Achieve Goals: Good  Plan Discharge plan remains appropriate;Frequency remains appropriate    Co-evaluation                 AM-PAC OT "6 Clicks" Daily Activity     Outcome Measure   Help from another person eating meals?: None Help from another person taking care of personal grooming?: A Little Help from another person toileting, which includes using toliet, bedpan, or urinal?: A Lot Help from another person bathing (including washing, rinsing, drying)?: A Lot Help from another person to put on and taking off regular upper body clothing?: A Little Help from another person to put on and taking off regular lower body clothing?: A Lot 6 Click Score: 16    End of Session    OT Visit Diagnosis: Muscle weakness (generalized) (M62.81);Unsteadiness on feet (R26.81)   Activity Tolerance Patient tolerated treatment well   Patient Left in chair;with call bell/phone within reach;with family/visitor present   Nurse Communication Other (comment) (no purewick per pt/family request, pt having cough)        Time: 7494-4967 OT Time Calculation (min): 15 min  Charges: OT General Charges $OT Visit: 1 Visit OT Treatments $Self Care/Home Management : 8-22 mins  Ardeth Perfect., MPH, MS, OTR/L ascom [phone] 06/12/22, 5:02 PM

## 2022-06-12 NOTE — Progress Notes (Signed)
[facility]  ROUNDING NOTE   Subjective:   Patient seen sitting up in bed, daughter at bedside Currently eating breakfast States she feels better Appetite improving, denies nausea and vomiting Continues to feel weak, but states that has improved since admission.  Sodium 132  Objective:  Vital signs in last 24 hours:  Temp:  [98.1 F (36.7 C)-98.9 F (37.2 C)] 98.1 F (36.7 C) (08/21 1133) Pulse Rate:  [88-106] 102 (08/21 1133) Resp:  [18-21] 18 (08/21 1133) BP: (137-157)/(60-89) 152/89 (08/21 1133) SpO2:  [95 %-99 %] 99 % (08/21 1133) Weight:  [59.2 kg] 59.2 kg (08/21 0533)  Weight change: 1.219 kg Filed Weights   06/10/22 0500 06/11/22 0233 06/12/22 0533  Weight: 57.9 kg 58 kg 59.2 kg    Intake/Output: I/O last 3 completed shifts: In: 120 [P.O.:120] Out: 550 [Urine:550]   Intake/Output this shift:  Total I/O In: 360 [P.O.:360] Out: -   Physical Exam: General: NAD, resting comfortably  Head: Normocephalic, atraumatic. Moist oral mucosal membranes  Eyes: Anicteric  Lungs:  Clear to auscultation, normal effort  Heart: Regular rate and rhythm  Abdomen:  Soft, nontender, nondistended  Extremities: No peripheral edema.  Neurologic: Nonfocal, moving all four extremities  Skin: No lesions    Basic Metabolic Panel: Recent Labs  Lab 06/08/22 0219 06/08/22 0600 06/08/22 1109 06/08/22 1603 06/09/22 0829 06/09/22 1039 06/10/22 1204 06/10/22 1604 06/10/22 1943 06/11/22 0008 06/11/22 0426 06/11/22 0838 06/12/22 0517  NA 127* 127* 127*   < > 121*   < > 127*   < > 128* 129* 131* 130* 132*  K 3.7 3.3* 3.1*  --  3.6  --  3.2*  --   --   --  3.6  --  3.6  CL 99 99 97*  --  91*  --   --   --   --   --  98  --   --   CO2 20* 22 22  --  22  --   --   --   --   --  25  --   --   GLUCOSE 100* 104* 191*  --  93  --   --   --   --   --  120*  --   --   BUN '11 11 13  '$ --  10  --   --   --   --   --  14  --   --   CREATININE 0.59 0.57 0.68  --  0.55  --   0.72  --   --   --  0.72  --   --   CALCIUM 9.2 9.3 9.5  --  9.3  --   --   --   --   --  9.6  --   --   MG  --  1.9  --   --   --   --   --   --   --   --   --   --   --   PHOS  --   --   --   --   --   --   --   --   --   --  2.1*  --  3.4   < > = values in this interval not displayed.     Liver Function Tests: Recent Labs  Lab 06/08/22 1109 06/11/22 0426  AST 24  --   ALT 16  --  ALKPHOS 46  --   BILITOT 3.2*  --   PROT 5.7*  --   ALBUMIN 3.6 3.7    No results for input(s): "LIPASE", "AMYLASE" in the last 168 hours. No results for input(s): "AMMONIA" in the last 168 hours.  CBC: Recent Labs  Lab 06/07/22 1950 06/08/22 1109  WBC 9.3 8.3  HGB 14.4 13.7  HCT 40.2 38.6  MCV 86.3 87.5  PLT 301 243     Cardiac Enzymes: No results for input(s): "CKTOTAL", "CKMB", "CKMBINDEX", "TROPONINI" in the last 168 hours.  BNP: Invalid input(s): "POCBNP"  CBG: No results for input(s): "GLUCAP" in the last 168 hours.  Microbiology: Results for orders placed or performed in visit on 11/25/21  Urine Culture     Status: Abnormal   Collection Time: 11/25/21 10:19 AM   Specimen: Urine  Result Value Ref Range Status   MICRO NUMBER: [id number]  Final   SPECIMEN QUALITY: Adequate  Final   Sample Source URINE  Final   STATUS: FINAL  Final   ISOLATE 1: Escherichia coli (A)  Final    Comment: Greater than 100,000 CFU/mL of Escherichia coli      Susceptibility   Escherichia coli - URINE CULTURE, REFLEX    AMOX/CLAVULANIC 4 Sensitive     AMPICILLIN >=32 Resistant     AMPICILLIN/SULBACTAM 16 Intermediate     CEFAZOLIN* <=4 Not Reportable      * For infections other than uncomplicated UTI caused by E. coli, K. pneumoniae or P. mirabilis: Cefazolin is resistant if MIC > or = 8 mcg/mL. (Distinguishing susceptible versus intermediate for isolates with MIC < or = 4 mcg/mL requires additional testing.) For uncomplicated UTI caused by E. coli, K. pneumoniae or P. mirabilis: Cefazolin  is susceptible if MIC <32 mcg/mL and predicts susceptible to the oral agents cefaclor, cefdinir, cefpodoxime, cefprozil, cefuroxime, cephalexin and loracarbef.     CEFTAZIDIME <=1 Sensitive     CEFEPIME <=1 Sensitive     CEFTRIAXONE <=1 Sensitive     CIPROFLOXACIN <=0.25 Sensitive     LEVOFLOXACIN <=0.12 Sensitive     GENTAMICIN <=1 Sensitive     IMIPENEM <=0.25 Sensitive     NITROFURANTOIN <=16 Sensitive     PIP/TAZO <=4 Sensitive     TOBRAMYCIN <=1 Sensitive     TRIMETH/SULFA* <=20 Sensitive      * For infections other than uncomplicated UTI caused by E. coli, K. pneumoniae or P. mirabilis: Cefazolin is resistant if MIC > or = 8 mcg/mL. (Distinguishing susceptible versus intermediate for isolates with MIC < or = 4 mcg/mL requires additional testing.) For uncomplicated UTI caused by E. coli, K. pneumoniae or P. mirabilis: Cefazolin is susceptible if MIC <32 mcg/mL and predicts susceptible to the oral agents cefaclor, cefdinir, cefpodoxime, cefprozil, cefuroxime, cephalexin and loracarbef. Legend: S = Susceptible  I = Intermediate R = Resistant  NS = Not susceptible * = Not tested  NR = Not reported **NN = See antimicrobic comments     Coagulation Studies: No results for input(s): "LABPROT", "INR" in the last 72 hours.  Urinalysis: Recent Labs    06/11/22 0756  COLORURINE YELLOW*  LABSPEC 1.012  PHURINE 6.0  GLUCOSEU NEGATIVE  HGBUR MODERATE*  BILIRUBINUR NEGATIVE  KETONESUR NEGATIVE  PROTEINUR NEGATIVE  NITRITE NEGATIVE  LEUKOCYTESUR NEGATIVE       Imaging: No results found.   Medications:     amLODipine  5 mg Oral Daily   levothyroxine  75 mcg Oral Q0600   ondansetron (ZOFRAN) IV  4 mg Intravenous Once   rosuvastatin  10 mg Oral Daily   sodium chloride  1 g Oral TID WC   acetaminophen **OR** [DISCONTINUED] acetaminophen  Assessment/ Plan:  Ms. Chisom Muntean is a 81 y.o.  female  with past medical history consisting of hypertension,  hypothyroidism, B12 deficiency, obstructive sleep apnea, hyperlipidemia, NPH, who was admitted to [facility] on 06/07/2022 for Acute hyponatremia [E87.1] Hypernatremia [E87.0] Hyponatremia [E87.1] Generalized weakness [R53.1]   Acute hyponatremia initially believed to be due to SNS adaptation however patient was recently treated for hydrocephalus at [facility] and received a lumbar drain.  Liver disease could also contribute to hyponatremia.   Patient not a candidate for tolvaptan due to liver disease seen on abdominal ultrasound in February 2023.  Was given furosemide 20 mg IV once,  Remains on salt tabs 1 g 3 times daily, with fluid restriction 1200 ml/d. May consider maintaining this for 1 week beyond discharge.  Sodium levels continue to improve.  Magic cup protein supplementation. Patient and family encouraged to maintain oral intake and ensure adequate protein.   2. Hypokalemia.  Corrected with oral supplementation.    LOS: 4 Darrien Laakso 8/21/20233:52 PM

## 2022-06-12 NOTE — Progress Notes (Signed)
PROGRESS NOTE    Crystal Lynch  ZJI:[id number] DOB: 1941-06-28 DOA: 06/07/2022 PCP: Einar Pheasant, MD    Brief Narrative:   Crystal Lynch is a 81 y.o. female with medical history significant of  NPH, HTN, hypothyroidism, HLD,OSA, B12 def who has interim history of admission to [facility] for further evaluation of NPH and external lumbar drainage of csf vial lumbar spinal catheter. Per patient s/p evaluation she was noted not to qualify for shunt placement.  Patient states s/p procedure she had HA/Nausea.She states that  she was treated symptomatically, however now at home close to two weeks and has had worsening of symptoms of HA and nausea over the last three days. Due to worsening of symptoms patient presents to ED.   Found with hyponatremia with NA of 116 down from base of 136.  Nephrology was called who recommended admission to SDU and starting on 3% saline.  Then was treated with lasix and fluid restriction. Sodium level is stable. Now placement pending.    Consultants:  nephrology  Procedures:   Antimicrobials:      Subjective: Family at bedside. No complaints. No HA, sob, dizziness, or sob  Objective: Vitals:   06/12/22 0410 06/12/22 0533 06/12/22 0755 06/12/22 0757  BP: (!) 155/79   (!) 140/72  Pulse: 88   95  Resp: 18     Temp: 98.2 F (36.8 C)  98.3 F (36.8 C)   TempSrc: Oral  Oral   SpO2: 99%   98%  Weight:  59.2 kg    Height:        Intake/Output Summary (Last 24 hours) at 06/12/2022 0910 Last data filed at 06/11/2022 2000 Gross per 24 hour  Intake 120 ml  Output 300 ml  Net -180 ml   Filed Weights   06/10/22 0500 06/11/22 0233 06/12/22 0533  Weight: 57.9 kg 58 kg 59.2 kg    Examination: Calm, NAD Cta no w/r Reg s1/s2 no gallop Soft benign +bs No edema Awake and alert Mood and affect appropriate in current setting     Data Reviewed: I have personally reviewed following labs and imaging studies  CBC: Recent Labs  Lab  06/07/22 1950 06/08/22 1109  WBC 9.3 8.3  HGB 14.4 13.7  HCT 40.2 38.6  MCV 86.3 87.5  PLT 301 175   Basic Metabolic Panel: Recent Labs  Lab 06/08/22 0219 06/08/22 0600 06/08/22 1109 06/08/22 1603 06/09/22 0829 06/09/22 1039 06/10/22 1204 06/10/22 1604 06/10/22 1943 06/11/22 0008 06/11/22 0426 06/11/22 0838 06/12/22 0517  NA 127* 127* 127*   < > 121*   < > 127*   < > 128* 129* 131* 130* 132*  K 3.7 3.3* 3.1*  --  3.6  --  3.2*  --   --   --  3.6  --  3.6  CL 99 99 97*  --  91*  --   --   --   --   --  98  --   --   CO2 20* 22 22  --  22  --   --   --   --   --  25  --   --   GLUCOSE 100* 104* 191*  --  93  --   --   --   --   --  120*  --   --   BUN '11 11 13  '$ --  10  --   --   --   --   --  14  --   --   CREATININE 0.59 0.57 0.68  --  0.55  --  0.72  --   --   --  0.72  --   --   CALCIUM 9.2 9.3 9.5  --  9.3  --   --   --   --   --  9.6  --   --   MG  --  1.9  --   --   --   --   --   --   --   --   --   --   --   PHOS  --   --   --   --   --   --   --   --   --   --  2.1*  --  3.4   < > = values in this interval not displayed.   GFR: Estimated Creatinine Clearance: 42.9 mL/min (by C-G formula based on SCr of 0.72 mg/dL). Liver Function Tests: Recent Labs  Lab 06/08/22 1109 06/11/22 0426  AST 24  --   ALT 16  --   ALKPHOS 46  --   BILITOT 3.2*  --   PROT 5.7*  --   ALBUMIN 3.6 3.7   No results for input(s): "LIPASE", "AMYLASE" in the last 168 hours. No results for input(s): "AMMONIA" in the last 168 hours. Coagulation Profile: No results for input(s): "INR", "PROTIME" in the last 168 hours. Cardiac Enzymes: No results for input(s): "CKTOTAL", "CKMB", "CKMBINDEX", "TROPONINI" in the last 168 hours. BNP (last 3 results) No results for input(s): "PROBNP" in the last 8760 hours. HbA1C: No results for input(s): "HGBA1C" in the last 72 hours. CBG: No results for input(s): "GLUCAP" in the last 168 hours. Lipid Profile: No results for input(s): "CHOL", "HDL",  "LDLCALC", "TRIG", "CHOLHDL", "LDLDIRECT" in the last 72 hours. Thyroid Function Tests: No results for input(s): "TSH", "T4TOTAL", "FREET4", "T3FREE", "THYROIDAB" in the last 72 hours.  Anemia Panel: No results for input(s): "VITAMINB12", "FOLATE", "FERRITIN", "TIBC", "IRON", "RETICCTPCT" in the last 72 hours. Sepsis Labs: No results for input(s): "PROCALCITON", "LATICACIDVEN" in the last 168 hours.  No results found for this or any previous visit (from the past 240 hour(s)).       Radiology Studies: No results found.      Scheduled Meds:  amLODipine  5 mg Oral Daily   levothyroxine  75 mcg Oral Q0600   ondansetron (ZOFRAN) IV  4 mg Intravenous Once   rosuvastatin  10 mg Oral Daily   sodium chloride  1 g Oral TID WC   Continuous Infusions:  Assessment & Plan:   Active Problems:   Hypertension   Hypercholesterolemia   Hyponatremia   NPH (normal pressure hydrocephalus) (HCC)   OSA (obstructive sleep apnea)   Hypophosphatemia   Hyponatremia Transfer to [facility] following Likely cns adaptation .  Recently been treated for NPH at [facility] with lumbar puncture drain.  Has been having nausea for a while. 3% hypertonic solution started.then stopped. Not a candidate for tolvaptan due to liver disease seen on abdominal ultrasound in February 2023 20 mg furosemide IV x1 Salt tablets 1 g 3 times daily and fluid restriction 8/21 sodium level improving  continue current regimen with salt tablets and 1200 fluid restriction discussed this with patient and family      Hypophosphatemia Replace and now stable   NPH -recent admit to [facility]  -s/p external lumbar drainage of csf vial lumbar spinal  catheter -patient not candidate for shunt  -prn ELD per d/c recs  8/21 patient will need to follow-up with outpatient neurologist on discharge          HTN Continue amlodipine     Hypothyroidism  Continue Synthroid       HLD  statins      OSA Does not tolerate CPAP     DVT prophylaxis: SCD Code Status: Full for Family Communication: Family at bedside Disposition Plan: SNF Status is: Inpatient Remains inpatient appropriate because: Unsafe discharge need to monitor sodium.      LOS: 4 days   Time spent: 35 minutes    Nolberto Hanlon, MD [facility] Pager [phone]  If 7PM-7AM, please contact night-coverage 06/12/2022, 9:10 AM

## 2022-06-12 NOTE — NC FL2 (Signed)
[place] LEVEL OF CARE SCREENING TOOL     IDENTIFICATION  Patient Name: Crystal Lynch Birthdate: [birth year] Sex: female Admission Date (Current Location): 06/07/2022  [place] and [place] Number:  Engineering geologist and Address:  [facility], [address] [place], [place] [address]      Provider Number: [id number]  Attending Physician Name and Address:  Nolberto Hanlon, MD  Relative Name and Phone Number:       Current Level of Care: Hospital Recommended Level of Care: [place] Prior Approval Number:    Date Approved/Denied:   PASRR Number: [id number]  Discharge Plan: SNF    Current Diagnoses: Patient Active Problem List   Diagnosis Date Noted   Hypophosphatemia 06/11/2022   NPH (normal pressure hydrocephalus) ([place]) 06/09/2022   OSA (obstructive sleep apnea) 06/09/2022   Kidney lesion 12/14/2021   Left hip pain 12/13/2021   Memory change 11/27/2021   Fatigue 07/23/2021   Aortic atherosclerosis ([place]) 04/17/2021   Right acute serous otitis media    Hyponatremia 02/23/2021   Prolonged QT interval 02/23/2021   Dizziness 09/12/2020   Fall 03/08/2020   Fatty liver 12/28/2019   Chest pain 07/25/2019   Frequent urinary tract infections 12/24/2018   UTI (urinary tract infection) 10/11/2018   Hypercalcemia 01/09/2017   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 Waiting on respiratory to get the settings.) Incontinent Weight: 130 lb 8.2 oz (59.2 kg) Height:  '5\' 2"'$  (157.5 cm)  BEHAVIORAL SYMPTOMS/MOOD  NEUROLOGICAL BOWEL NUTRITION STATUS   (None)  (None) Incontinent Diet (Heart healthy. Fluid restriction 1200 mL.)  AMBULATORY STATUS COMMUNICATION OF NEEDS Skin   Extensive Assist Verbally Normal                       Personal Care Assistance Level of Assistance  Bathing, Feeding, Dressing Bathing Assistance: Limited assistance Feeding assistance: Limited assistance Dressing Assistance: Limited assistance     Functional Limitations Info  Sight, Hearing, Speech Sight Info: Adequate Hearing Info: Adequate Speech Info: Adequate    SPECIAL CARE FACTORS FREQUENCY  PT (By licensed PT), OT (By licensed OT)     PT Frequency: 5 x week OT Frequency: 5 x week            Contractures Contractures Info: Not present    Additional Factors Info  Code Status, Allergies Code Status Info: Full code Allergies Info: Meloxicam           Current Medications (06/12/2022):  This is the current hospital active medication list Current Facility-Administered Medications  Medication Dose Route Frequency Provider Last Rate Last Admin   acetaminophen (TYLENOL) tablet 500 mg  500 mg Oral Q6H PRN Nolberto Hanlon, MD       amLODipine (NORVASC) tablet 5 mg  5 mg Oral Daily Nolberto Hanlon, MD   5 mg at 06/12/22 5621   levothyroxine (SYNTHROID) tablet 75 mcg  75 mcg Oral Q0600 Nolberto Hanlon, MD   75 mcg at 06/12/22 0614   ondansetron (ZOFRAN) injection 4 mg  4 mg Intravenous Once Sharion Settler, NP  rosuvastatin (CRESTOR) tablet 10 mg  10 mg Oral Daily Nolberto Hanlon, MD   10 mg at 06/11/22 2018   sodium chloride tablet 1 g  1 g Oral TID WC Colon Flattery, NP   1 g at 06/12/22 1139     Discharge Medications: Please see discharge summary for a list of discharge medications.  Relevant Imaging Results:  Relevant Lab Results:   Additional Information SS#: [id number]  Candie Chroman, LCSW

## 2022-06-12 NOTE — TOC Initial Note (Signed)
Transition of Care [facility]) - Initial/Assessment Note    Patient Details  Name: Crystal Lynch MRN: [id number] Date of Birth: 23-Aug-1941  Transition of Care [facility]) CM/SW Contact:    Candie Chroman, LCSW Phone Number: 06/12/2022, 12:52 PM  Clinical Narrative:  CSW met with patient. Sister at bedside. CSW introduced role and explained that therapy recommendations would be discussed. Patient is agreeable to SNF placement. [place] are top preferences. Left message for [place] admissions coordinator to notify. Patient is not on CPAP at home but is using here. SNF will have to order CPAP machine. Respiratory will obtain settings tonight. No further concerns. CSW encouraged patient to contact CSW as needed. CSW will continue to follow patient for support and facilitate discharge to SNF once medically stable.                Expected Discharge Plan: Skilled Nursing Facility Barriers to Discharge: Insurance Authorization, SNF Pending bed offer   Patient Goals and CMS Choice   CMS Medicare.gov Compare Post Acute Care list provided to:: Patient (Sister)    Expected Discharge Plan and Services Expected Discharge Plan: [place] Acute Care Choice: [place] Living arrangements for the past 2 months: Single Family Home                                      Prior Living Arrangements/Services Living arrangements for the past 2 months: Single Family Home Lives with:: Spouse Patient language and need for interpreter reviewed:: Yes Do you feel safe going back to the place where you live?: Yes      Need for Family Participation in Patient Care: Yes (Comment) Care giver support system in place?: Yes (comment)   Criminal Activity/Legal Involvement Pertinent to Current Situation/Hospitalization: No - Comment as needed  Activities of Daily Living Home Assistive Devices/Equipment: Cane (specify quad or straight),  Eyeglasses ADL Screening (condition at time of admission) Patient's cognitive ability adequate to safely complete daily activities?: Yes Is the patient deaf or have difficulty hearing?: No Does the patient have difficulty seeing, even when wearing glasses/contacts?: No Does the patient have difficulty concentrating, remembering, or making decisions?: No Patient able to express need for assistance with ADLs?: Yes Does the patient have difficulty dressing or bathing?: No Independently performs ADLs?: Yes (appropriate for developmental age) Does the patient have difficulty walking or climbing stairs?: No Weakness of Legs: None Weakness of Arms/Hands: None  Permission Sought/Granted Permission sought to share information with : Facility Sport and exercise psychologist, Family Supports Permission granted to share information with : Yes, Verbal Permission Granted     Permission granted to share info w AGENCY: SNF's  Permission granted to share info w Relationship: Sister     Emotional Assessment Appearance:: Appears stated age Attitude/Demeanor/Rapport: Engaged, Gracious Affect (typically observed): Accepting, Appropriate, Calm, Pleasant Orientation: : Oriented to Self, Oriented to Place, Oriented to  Time, Oriented to Situation Alcohol / Substance Use: Not Applicable Psych Involvement: No (comment)  Admission diagnosis:  Acute hyponatremia [E87.1] Hypernatremia [E87.0] Hyponatremia [E87.1] Generalized weakness [R53.1] Patient Active Problem List   Diagnosis Date Noted   Hypophosphatemia 06/11/2022   NPH (normal pressure hydrocephalus) ([place]) 06/09/2022   OSA (obstructive sleep apnea) 06/09/2022   Kidney lesion 12/14/2021   Left hip pain 12/13/2021   Memory change 11/27/2021   Fatigue 07/23/2021   Aortic atherosclerosis ([place]) 04/17/2021  Right acute serous otitis media    Hyponatremia 02/23/2021   Prolonged QT interval 02/23/2021   Dizziness 09/12/2020   Fall 03/08/2020   Fatty liver  12/28/2019   Chest pain 07/25/2019   Frequent urinary tract infections 12/24/2018   UTI (urinary tract infection) 10/11/2018   Hypercalcemia 01/09/2017   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   Sleeping difficulties 03/08/2015   Health care maintenance 01/10/2015   Osteopenia 08/10/2014   Hyperbilirubinemia 10/27/2012   Hematuria 10/27/2012   Hypertension 10/21/2012   Hypercholesterolemia 10/21/2012   Hypothyroidism 10/21/2012   Esophagitis 10/21/2012   PCP:  Einar Pheasant, MD Pharmacy:   [id number]: [phone]: [phone] [facility] [place] [facility] [facility] [address] [id number]: [phone]: [phone]    Social Determinants of Health (SDOH) Interventions    Readmission Risk Interventions     No data to display

## 2022-06-12 NOTE — Care Management Important Message (Signed)
Important Message  Patient Details  Name: Leilanny Fluitt MRN: [id number] Date of Birth: 1941-02-18   Medicare Important Message Given:  Yes     Dannette Barbara 06/12/2022, 9:35 AM

## 2022-06-13 DIAGNOSIS — E870 Hyperosmolality and hypernatremia: Secondary | ICD-10-CM | POA: Diagnosis not present

## 2022-06-13 NOTE — Progress Notes (Signed)
Mobility Specialist - Progress Note   06/13/22 1000  Mobility  Activity Ambulated with assistance in room;Transferred from bed to chair  Level of Assistance Minimal assist, patient does 75% or more  Assistive Device Front wheel walker  Distance Ambulated (ft) 25 ft  Activity Response Tolerated well  $Mobility charge 1 Mobility     Post-mobility: 108 HR, 97% SpO2   Pt lying in bed upon arrival, utilizing RA. Pt sat EOB with HHA. Mild lightheadedness upon sitting and throughout session. Pt ambulated in room with minA; veers R and needs assistance to navigate RW to prevent obstacle collision. VC to maintain proper RW biomechanics; ambulates slightly outside with far flexed posture from RW at times. 2 short standing rest breaks. Pt left in recliner with alarm set, needs in reach.    Kathee Delton Mobility Specialist 06/13/22, 10:17 AM

## 2022-06-13 NOTE — Progress Notes (Signed)
Mobility Specialist - Progress Note   06/13/22 1500  Mobility  Activity Transferred from chair to bed  Level of Assistance Minimal assist, patient does 75% or more  Assistive Device Front wheel walker  Distance Ambulated (ft) 2 ft  Activity Response Tolerated well  $Mobility charge 1 Mobility     Pt sitting in recliner upon arrival, utilizing RA. Pt stood from chair x2 with minA to maintain balance d/t posterior lean and increased difficulty progressing gait this afternoon as compared to this morning. Difficulty laterally positioning LLE for proper foot placement. Pt performed therex prior to 2nd STS attempt to engage LE for upright positioning. Fatigued. Pt left in bed with alarm set, needs in reach. Family at bedside.   Kathee Delton Mobility Specialist 06/13/22, 4:39 PM

## 2022-06-13 NOTE — Progress Notes (Addendum)
[facility]  ROUNDING NOTE   Subjective:   Patient seen resting in bed, family at bedside Patient currently eating breakfast, poor appetite remains Remains on room air Continues to have weakness but states has improved.  Sodium 132  Objective:  Vital signs in last 24 hours:  Temp:  [97.5 F (36.4 C)-99.1 F (37.3 C)] 99.1 F (37.3 C) (08/22 1104) Pulse Rate:  [76-105] 105 (08/22 1104) Resp:  [15-19] 18 (08/22 1104) BP: (126-157)/(63-76) 126/63 (08/22 1104) SpO2:  [96 %-99 %] 96 % (08/22 1104) Weight:  [59.5 kg-59.8 kg] 59.8 kg (08/22 0536)  Weight change: 0.3 kg Filed Weights   06/12/22 0533 06/13/22 0441 06/13/22 0536  Weight: 59.2 kg 59.5 kg 59.8 kg    Intake/Output: I/O last 3 completed shifts: In: 600 [P.O.:600] Out: 1050 [Urine:1050]   Intake/Output this shift:  No intake/output data recorded.  Physical Exam: General: NAD, resting comfortably  Head: Normocephalic, atraumatic. Moist oral mucosal membranes  Eyes: Anicteric  Lungs:  Clear to auscultation, normal effort  Heart: Regular rate and rhythm  Abdomen:  Soft, nontender, nondistended  Extremities: No peripheral edema.  Neurologic: Nonfocal, moving all four extremities  Skin: No lesions    Basic Metabolic Panel: Recent Labs  Lab 06/08/22 0219 06/08/22 0600 06/08/22 1109 06/08/22 1603 06/09/22 0829 06/09/22 1039 06/10/22 1204 06/10/22 1604 06/10/22 1943 06/11/22 0008 06/11/22 0426 06/11/22 0838 06/12/22 0517  NA 127* 127* 127*   < > 121*   < > 127*   < > 128* 129* 131* 130* 132*  K 3.7 3.3* 3.1*  --  3.6  --  3.2*  --   --   --  3.6  --  3.6  CL 99 99 97*  --  91*  --   --   --   --   --  98  --   --   CO2 20* 22 22  --  22  --   --   --   --   --  25  --   --   GLUCOSE 100* 104* 191*  --  93  --   --   --   --   --  120*  --   --   BUN '11 11 13  '$ --  10  --   --   --   --   --  14  --   --   CREATININE 0.59 0.57 0.68  --  0.55  --  0.72  --   --   --  0.72  --   --    CALCIUM 9.2 9.3 9.5  --  9.3  --   --   --   --   --  9.6  --   --   MG  --  1.9  --   --   --   --   --   --   --   --   --   --   --   PHOS  --   --   --   --   --   --   --   --   --   --  2.1*  --  3.4   < > = values in this interval not displayed.     Liver Function Tests: Recent Labs  Lab 06/08/22 1109 06/11/22 0426  AST 24  --   ALT 16  --   ALKPHOS 46  --   BILITOT 3.2*  --  PROT 5.7*  --   ALBUMIN 3.6 3.7    No results for input(s): "LIPASE", "AMYLASE" in the last 168 hours. No results for input(s): "AMMONIA" in the last 168 hours.  CBC: Recent Labs  Lab 06/07/22 1950 06/08/22 1109  WBC 9.3 8.3  HGB 14.4 13.7  HCT 40.2 38.6  MCV 86.3 87.5  PLT 301 243     Cardiac Enzymes: No results for input(s): "CKTOTAL", "CKMB", "CKMBINDEX", "TROPONINI" in the last 168 hours.  BNP: Invalid input(s): "POCBNP"  CBG: No results for input(s): "GLUCAP" in the last 168 hours.  Microbiology: Results for orders placed or performed in visit on 11/25/21  Urine Culture     Status: Abnormal   Collection Time: 11/25/21 10:19 AM   Specimen: Urine  Result Value Ref Range Status   MICRO NUMBER: [id number]  Final   SPECIMEN QUALITY: Adequate  Final   Sample Source URINE  Final   STATUS: FINAL  Final   ISOLATE 1: Escherichia coli (A)  Final    Comment: Greater than 100,000 CFU/mL of Escherichia coli      Susceptibility   Escherichia coli - URINE CULTURE, REFLEX    AMOX/CLAVULANIC 4 Sensitive     AMPICILLIN >=32 Resistant     AMPICILLIN/SULBACTAM 16 Intermediate     CEFAZOLIN* <=4 Not Reportable      * For infections other than uncomplicated UTI caused by E. coli, K. pneumoniae or P. mirabilis: Cefazolin is resistant if MIC > or = 8 mcg/mL. (Distinguishing susceptible versus intermediate for isolates with MIC < or = 4 mcg/mL requires additional testing.) For uncomplicated UTI caused by E. coli, K. pneumoniae or P. mirabilis: Cefazolin is susceptible if MIC <32 mcg/mL  and predicts susceptible to the oral agents cefaclor, cefdinir, cefpodoxime, cefprozil, cefuroxime, cephalexin and loracarbef.     CEFTAZIDIME <=1 Sensitive     CEFEPIME <=1 Sensitive     CEFTRIAXONE <=1 Sensitive     CIPROFLOXACIN <=0.25 Sensitive     LEVOFLOXACIN <=0.12 Sensitive     GENTAMICIN <=1 Sensitive     IMIPENEM <=0.25 Sensitive     NITROFURANTOIN <=16 Sensitive     PIP/TAZO <=4 Sensitive     TOBRAMYCIN <=1 Sensitive     TRIMETH/SULFA* <=20 Sensitive      * For infections other than uncomplicated UTI caused by E. coli, K. pneumoniae or P. mirabilis: Cefazolin is resistant if MIC > or = 8 mcg/mL. (Distinguishing susceptible versus intermediate for isolates with MIC < or = 4 mcg/mL requires additional testing.) For uncomplicated UTI caused by E. coli, K. pneumoniae or P. mirabilis: Cefazolin is susceptible if MIC <32 mcg/mL and predicts susceptible to the oral agents cefaclor, cefdinir, cefpodoxime, cefprozil, cefuroxime, cephalexin and loracarbef. Legend: S = Susceptible  I = Intermediate R = Resistant  NS = Not susceptible * = Not tested  NR = Not reported **NN = See antimicrobic comments     Coagulation Studies: No results for input(s): "LABPROT", "INR" in the last 72 hours.  Urinalysis: Recent Labs    06/11/22 0756  COLORURINE YELLOW*  LABSPEC 1.012  PHURINE 6.0  GLUCOSEU NEGATIVE  HGBUR MODERATE*  BILIRUBINUR NEGATIVE  KETONESUR NEGATIVE  PROTEINUR NEGATIVE  NITRITE NEGATIVE  LEUKOCYTESUR NEGATIVE       Imaging: No results found.   Medications:     amLODipine  5 mg Oral Daily   levothyroxine  75 mcg Oral Q0600   ondansetron (ZOFRAN) IV  4 mg Intravenous Once   rosuvastatin  10 mg Oral  Daily   sodium chloride  1 g Oral TID WC   acetaminophen **OR** [DISCONTINUED] acetaminophen, guaiFENesin-dextromethorphan  Assessment/ Plan:  [name] is a 81 y.o.  female  with past medical history consisting of hypertension,  hypothyroidism, B12 deficiency, obstructive sleep apnea, hyperlipidemia, NPH, who was admitted to [facility] on 06/07/2022 for Acute hyponatremia [E87.1] Hypernatremia [E87.0] Hyponatremia [E87.1] Generalized weakness [R53.1]   Acute hyponatremia initially believed to be due to SNS adaptation however patient was recently treated for hydrocephalus at [facility] and received a lumbar drain.  Liver disease could also contribute to hyponatremia.   Patient not a candidate for tolvaptan due to liver disease seen on abdominal ultrasound in February 2023.  Sodium levels continue to improve, 132 today.  We will continue salt tablets for additional week beyond discharge.  Patient can follow-up with PCP to assist determine continued use.  Patient encouraged to maintain adequate protein intake with meals.  2. Hypokalemia.  Corrected  Due to sodium stability, we will sign off at this time.  Feel free to contact us with further questions or concerns.   LOS: 5 Marcena Dias 8/22/202311:35 AM

## 2022-06-13 NOTE — Progress Notes (Signed)
PROGRESS NOTE    Crystal Lynch  HBZ:[id number] DOB: [birth year] DOA: 06/07/2022 PCP: Einar Pheasant, MD    Brief Narrative:   Crystal Lynch is a 81 y.o. female with medical history significant of  NPH, HTN, hypothyroidism, HLD,OSA, B12 def who has interim history of admission to [facility] for further evaluation of NPH and external lumbar drainage of csf vial lumbar spinal catheter. Per patient s/p evaluation she was noted not to qualify for shunt placement.  Patient states s/p procedure she had HA/Nausea.She states that  she was treated symptomatically, however now at home close to two weeks and has had worsening of symptoms of HA and nausea over the last three days. Due to worsening of symptoms patient presents to ED.   Found with hyponatremia with NA of 116 down from base of 136.  Nephrology was called who recommended admission to SDU and starting on 3% saline.  Then was treated with lasix and fluid restriction. Sodium level is stable.   Now placement pending.    Consultants:  nephrology  Procedures:   Antimicrobials:      Subjective: Has no complaints.  No dizziness, shortness of breath, chest pain  Objective: Vitals:   06/12/22 2336 06/13/22 0441 06/13/22 0536 06/13/22 0729  BP: (!) 148/69 (!) 147/64  126/72  Pulse: 84 76  79  Resp: '16 19  16  '$ Temp: 97.8 F (36.6 C) 97.7 F (36.5 C)  98.6 F (37 C)  TempSrc: Oral     SpO2: 98% 98%  98%  Weight:  59.5 kg 59.8 kg   Height:        Intake/Output Summary (Last 24 hours) at 06/13/2022 0817 Last data filed at 06/12/2022 1917 Gross per 24 hour  Intake 600 ml  Output 750 ml  Net -150 ml   Filed Weights   06/12/22 0533 06/13/22 0441 06/13/22 0536  Weight: 59.2 kg 59.5 kg 59.8 kg    Examination: Calm, NAD Cta no w/r Reg s1/s2 no gallop Soft benign +bs No edema Awake and alert. Mood and affect appropriate in current setting     Data Reviewed: I have personally reviewed following labs and imaging  studies  CBC: Recent Labs  Lab 06/07/22 1950 06/08/22 1109  WBC 9.3 8.3  HGB 14.4 13.7  HCT 40.2 38.6  MCV 86.3 87.5  PLT 301 101   Basic Metabolic Panel: Recent Labs  Lab 06/08/22 0219 06/08/22 0600 06/08/22 1109 06/08/22 1603 06/09/22 0829 06/09/22 1039 06/10/22 1204 06/10/22 1604 06/10/22 1943 06/11/22 0008 06/11/22 0426 06/11/22 0838 06/12/22 0517  NA 127* 127* 127*   < > 121*   < > 127*   < > 128* 129* 131* 130* 132*  K 3.7 3.3* 3.1*  --  3.6  --  3.2*  --   --   --  3.6  --  3.6  CL 99 99 97*  --  91*  --   --   --   --   --  98  --   --   CO2 20* 22 22  --  22  --   --   --   --   --  25  --   --   GLUCOSE 100* 104* 191*  --  93  --   --   --   --   --  120*  --   --   BUN '11 11 13  '$ --  10  --   --   --   --   --  14  --   --   CREATININE 0.59 0.57 0.68  --  0.55  --  0.72  --   --   --  0.72  --   --   CALCIUM 9.2 9.3 9.5  --  9.3  --   --   --   --   --  9.6  --   --   MG  --  1.9  --   --   --   --   --   --   --   --   --   --   --   PHOS  --   --   --   --   --   --   --   --   --   --  2.1*  --  3.4   < > = values in this interval not displayed.   GFR: Estimated Creatinine Clearance: 42.9 mL/min (by C-G formula based on SCr of 0.72 mg/dL). Liver Function Tests: Recent Labs  Lab 06/08/22 1109 06/11/22 0426  AST 24  --   ALT 16  --   ALKPHOS 46  --   BILITOT 3.2*  --   PROT 5.7*  --   ALBUMIN 3.6 3.7   No results for input(s): "LIPASE", "AMYLASE" in the last 168 hours. No results for input(s): "AMMONIA" in the last 168 hours. Coagulation Profile: No results for input(s): "INR", "PROTIME" in the last 168 hours. Cardiac Enzymes: No results for input(s): "CKTOTAL", "CKMB", "CKMBINDEX", "TROPONINI" in the last 168 hours. BNP (last 3 results) No results for input(s): "PROBNP" in the last 8760 hours. HbA1C: No results for input(s): "HGBA1C" in the last 72 hours. CBG: No results for input(s): "GLUCAP" in the last 168 hours. Lipid Profile: No  results for input(s): "CHOL", "HDL", "LDLCALC", "TRIG", "CHOLHDL", "LDLDIRECT" in the last 72 hours. Thyroid Function Tests: No results for input(s): "TSH", "T4TOTAL", "FREET4", "T3FREE", "THYROIDAB" in the last 72 hours.  Anemia Panel: No results for input(s): "VITAMINB12", "FOLATE", "FERRITIN", "TIBC", "IRON", "RETICCTPCT" in the last 72 hours. Sepsis Labs: No results for input(s): "PROCALCITON", "LATICACIDVEN" in the last 168 hours.  No results found for this or any previous visit (from the past 240 hour(s)).       Radiology Studies: No results found.      Scheduled Meds:  amLODipine  5 mg Oral Daily   levothyroxine  75 mcg Oral Q0600   ondansetron (ZOFRAN) IV  4 mg Intravenous Once   rosuvastatin  10 mg Oral Daily   sodium chloride  1 g Oral TID WC   Continuous Infusions:  Assessment & Plan:   Active Problems:   Hypertension   Hypercholesterolemia   Hyponatremia   NPH (normal pressure hydrocephalus) (HCC)   OSA (obstructive sleep apnea)   Hypophosphatemia   Hyponatremia Transfer to [facility] following Likely cns adaptation .  Recently been treated for NPH at [facility] with lumbar puncture drain.  Has been having nausea for a while. 3% hypertonic solution started.then stopped. Not a candidate for tolvaptan due to liver disease seen on abdominal ultrasound in February 2023 20 mg furosemide IV x1 Salt tablets 1 g 3 times daily and fluid restriction 8/22 sodium levels improving.   Continue current regimen of salt tablets and 1200 fluid restriction    Hypophosphatemia Replaced and stable now    NPH -recent admit to [facility]  -s/p external lumbar drainage of csf vial lumbar spinal catheter -patient not candidate for shunt  -  prn ELD per d/c recs  8/22 patient will need to follow-up with outpatient neurologist on discharge          HTN Continue amlodipine     Hypothyroidism  Continue Synthroid       HLD  statins      OSA Does not tolerate CPAP     DVT prophylaxis: SCD Code Status: Full for Family Communication: Family at bedside Disposition Plan: SNF Status is: Inpatient Remains inpatient appropriate because: Unsafe discharge need to monitor sodium.      LOS: 5 days   Time spent: 35 minutes    Nolberto Hanlon, MD [facility] Pager [phone]  If 7PM-7AM, please contact night-coverage 06/13/2022, 8:17 AM

## 2022-06-13 NOTE — Progress Notes (Signed)
PT Cancellation Note  Patient Details Name: Crystal Lynch MRN: [id number] DOB: 02/18/1941   Cancelled Treatment:    Reason Eval/Treat Not Completed: Other (comment).  Chart reviewed and attempted to see pt.  Pt notes that she is fatigued and has already been up and walked with mobility specialist.  Will re-attempt in the AM as medically appropriate.  Plans for d/c tomorrow at some point.     Gwenlyn Saran, PT, DPT 06/13/22, 4:30 PM

## 2022-06-13 NOTE — Plan of Care (Signed)
  Problem: Education: Goal: Knowledge of General Education information will improve Description: Including pain rating scale, medication(s)/side effects and non-pharmacologic comfort measures 06/13/2022 1519 by Emmaline Life, RN Outcome: Progressing 06/13/2022 1518 by Emmaline Life, RN Outcome: Progressing

## 2022-06-13 NOTE — Plan of Care (Signed)
  Problem: Education: Goal: Knowledge of General Education information will improve Description: Including pain rating scale, medication(s)/side effects and non-pharmacologic comfort measures 06/13/2022 1522 by Emmaline Life, RN Outcome: Progressing 06/13/2022 1519 by Emmaline Life, RN Outcome: Progressing 06/13/2022 1518 by Emmaline Life, RN Outcome: Progressing

## 2022-06-13 NOTE — TOC Progression Note (Addendum)
Transition of Care [facility]) - Progression Note    Patient Details  Name: Crystal Lynch MRN: [id number] Date of Birth: [birth year]  Transition of Care [facility]) CM/SW [place], LCSW Phone Number: 06/13/2022, 11:59 AM  Clinical Narrative:   Gave bed offers to patient. Sister and another relative at bedside. Patient prefers [facility]. Left message for admissions coordinator to notify and ask about ordering CPAP. Per nighttime respiratory, she was unable to provide specific settings since she has auto settings.   3:04 pm: [facility] will [facility] to [facility] tomorrow. CSW started insurance authorization.  Expected Discharge Plan: Skilled Nursing Facility Barriers to Discharge: Ship broker, SNF Pending bed offer  Expected Discharge Plan and Services Expected Discharge Plan: [place] Choice: [place] arrangements for the past 2 months: Single Family Home                                       Social Determinants of Health (SDOH) Interventions    Readmission Risk Interventions     No data to display

## 2022-06-13 NOTE — Plan of Care (Signed)
  Problem: Education: Goal: Knowledge of General Education information will improve Description Including pain rating scale, medication(s)/side effects and non-pharmacologic comfort measures Outcome: Progressing   

## 2022-06-13 NOTE — Progress Notes (Signed)
OT Cancellation Note  Patient Details Name: Crystal Lynch MRN: [id number] DOB: [birth year]   Cancelled Treatment:    Reason Eval/Treat Not Completed: Other (comment). Upon attempt, pt with family selecting lunch menu items. Pt requesting re-attempt after lunch for OT. Will re-attempt as schedule permits.   Ardeth Perfect., MPH, MS, OTR/L ascom [phone] 06/13/22, 3:17 PM

## 2022-06-14 ENCOUNTER — Ambulatory Visit: Payer: Medicare Other

## 2022-06-14 DIAGNOSIS — E870 Hyperosmolality and hypernatremia: Secondary | ICD-10-CM | POA: Diagnosis not present

## 2022-06-14 MED ORDER — FLEET ENEMA 7-19 GM/118ML RE ENEM
1.0000 | ENEMA | Freq: Every day | RECTAL | Status: DC | PRN
  Administered 2022-06-14: 1 via RECTAL

## 2022-06-14 MED ORDER — ACETAMINOPHEN 500 MG PO TABS: 500.0000 mg | ORAL_TABLET | Freq: Four times a day (QID) | ORAL | 0 refills | Status: AC | PRN

## 2022-06-14 MED ORDER — BISACODYL 10 MG RE SUPP
10.0000 mg | Freq: Every day | RECTAL | Status: DC | PRN
  Administered 2022-06-14: 10 mg via RECTAL
  Filled 2022-06-14: qty 1

## 2022-06-14 MED ORDER — SODIUM CHLORIDE 1 G PO TABS: 1.0000 g | ORAL_TABLET | Freq: Three times a day (TID) | ORAL | 0 refills | Status: AC

## 2022-06-14 MED ORDER — BISACODYL 10 MG RE SUPP: 10.0000 mg | Freq: Every day | RECTAL | 0 refills | Status: DC | PRN

## 2022-06-14 MED ORDER — GUAIFENESIN-DM 100-10 MG/5ML PO SYRP: 5.0000 mL | ORAL_SOLUTION | ORAL | 0 refills | Status: DC | PRN

## 2022-06-14 MED ORDER — POLYETHYLENE GLYCOL 3350 17 G PO PACK
17.0000 g | PACK | Freq: Every day | ORAL | Status: DC
  Administered 2022-06-14: 17 g via ORAL
  Filled 2022-06-14: qty 1

## 2022-06-14 MED ORDER — AMLODIPINE BESYLATE 5 MG PO TABS: 5.0000 mg | ORAL_TABLET | Freq: Every day | ORAL | 0 refills | Status: DC

## 2022-06-14 MED ORDER — POLYETHYLENE GLYCOL 3350 17 G PO PACK: 17.0000 g | PACK | Freq: Every day | ORAL | 0 refills | Status: DC

## 2022-06-14 NOTE — Progress Notes (Signed)
Physical Therapy Treatment Patient Details Name: Crystal Lynch MRN: [id number] DOB: [birth year] Today's Date: 06/14/2022   History of Present Illness Crystal Lynch is a 81 y.o. female with past medical history consisting of hypertension, hypothyroidism, B12 deficiency, obstructive sleep apnea, hyperlipidemia, NPH, who was admitted to [facility] on 06/07/2022 for acute hyponatremia, generalized weakness, N/V, and headache. Patient notes no fever or chills, does note increased balance issues compared to baseline. She denies dysuria, cough, sob, tremors or abdominal pain.    PT Comments    Pt received in Semi-Fowler's position and agreeable to therapy.  Pt notes that she is feeling pretty good today.  Pt able to ambulate a significant amount more today than in prior sessions, but still has posterior bias once coming up into standing position and is not able to self-correct.  Pt did have controlled descent to the bed to illustrate to pt how much she was posterior leaning.  Pt still did not respond well when performing STS for the 3rd time and is only able to correct when she starts to walk.  When in walking, she tends to veer to the L and has a difficult time with kicking the walker.  She also tends to hike her R shoulder when ambulating as well.  Pt able to correct both, but has no carryover and cannot maintain either position once corrected for >3-4 steps.  Pt transferred back to room in which Md was present for and pt's husband and DIL were present as well.  Current discharge plans to SNF remain appropriate at this time.  Pt will continue to benefit from skilled therapy in order to address deficits listed below.      Recommendations for follow up therapy are one component of a multi-disciplinary discharge planning process, led by the attending physician.  Recommendations may be updated based on patient status, additional functional criteria and insurance authorization.  Follow Up Recommendations   Skilled nursing-short term rehab (<3 hours/day) Can patient physically be transported by private vehicle: Yes   Assistance Recommended at Discharge Frequent or constant Supervision/Assistance  Patient can return home with the following A lot of help with walking and/or transfers;A lot of help with bathing/dressing/bathroom;Help with stairs or ramp for entrance   Equipment Recommendations  None recommended by PT    Recommendations for Other Services       Precautions / Restrictions Precautions Precautions: Fall Precaution Comments: very fearful of falling as well Restrictions Weight Bearing Restrictions: No     Mobility  Bed Mobility Overal bed mobility: Needs Assistance Bed Mobility: Sit to Supine       Sit to supine: Mod assist        Transfers Overall transfer level: Needs assistance Equipment used: Rolling walker (2 wheels) Transfers: Sit to/from Stand Sit to Stand: Min guard           General transfer comment: Pt needs consistent direction in order to complete multiple STS's    Ambulation/Gait Ambulation/Gait assistance: Mod assist Gait Distance (Feet): 160 Feet Assistive device: Rolling walker (2 wheels) Gait Pattern/deviations: Step-to pattern, Steppage, Shuffle, Scissoring, Leaning posteriorly, Trunk flexed, Narrow base of support Gait velocity: decreased     General Gait Details: Pt has significant deviations in gait pattern that is difficult to correct due to pt not being able to remember instructions   Stairs             Wheelchair Mobility    Modified Rankin (Stroke Patients Only)  Balance Overall balance assessment: Needs assistance Sitting-balance support: No upper extremity supported, Feet supported Sitting balance-Leahy Scale: Good   Postural control: Posterior lean Standing balance support: Bilateral upper extremity supported Standing balance-Leahy Scale: Poor Standing balance comment: heavy posterior bias                             Cognition Arousal/Alertness: Awake/alert Behavior During Therapy: WFL for tasks assessed/performed Overall Cognitive Status: Within Functional Limits for tasks assessed                                 General Comments: grossly [facility]        Exercises      General Comments        Pertinent Vitals/Pain Pain Assessment Pain Assessment: No/denies pain    Home Living                          Prior Function            PT Goals (current goals can now be found in the care plan section) Acute Rehab PT Goals Patient Stated Goal: to go home. PT Goal Formulation: With patient Time For Goal Achievement: 06/25/22 Potential to Achieve Goals: Good Progress towards PT goals: Progressing toward goals    Frequency    Min 2X/week      PT Plan Current plan remains appropriate    Co-evaluation              AM-PAC PT "6 Clicks" Mobility   Outcome Measure  Help needed turning from your back to your side while in a flat bed without using bedrails?: A Little Help needed moving from lying on your back to sitting on the side of a flat bed without using bedrails?: A Little Help needed moving to and from a bed to a chair (including a wheelchair)?: A Lot Help needed standing up from a chair using your arms (e.g., wheelchair or bedside chair)?: A Lot Help needed to walk in hospital room?: A Lot Help needed climbing 3-5 steps with a railing? : Total 6 Click Score: 13    End of Session Equipment Utilized During Treatment: Gait belt Activity Tolerance: Patient tolerated treatment well Patient left: with call bell/phone within reach;in chair;with nursing/sitter in room;with family/visitor present Nurse Communication: Mobility status PT Visit Diagnosis: Unsteadiness on feet (R26.81);Other abnormalities of gait and mobility (R26.89);Repeated falls (R29.6);Muscle weakness (generalized) (M62.81);History of falling (Z91.81);Difficulty in  walking, not elsewhere classified (R26.2)     Time: 1112-1205 PT Time Calculation (min) (ACUTE ONLY): 53 min  Charges:  $Gait Training: 38-52 mins $Therapeutic Activity: 8-22 mins                     Gwenlyn Saran, PT, DPT 06/14/22, 2:07 PM

## 2022-06-14 NOTE — Discharge Summary (Signed)
Physician Discharge Summary   Patient: Crystal Lynch MRN: [id number]  DOB: [birth year]   Admit:     Date of Admission: 06/07/2022 Admitted from: home   Discharge: Date of discharge: 06/14/22 Disposition: Skilled nursing facility Condition at discharge: good  CODE STATUS: FULL     Discharge Physician: Emeterio Reeve, DO [facility]     PCP: Einar Pheasant, MD  Recommendations for Outpatient Follow-up:  Follow up with PCP Einar Pheasant, MD in 1-2 weeks Please obtain labs/tests: BMP in 1 week to follow sodium levels Please follow up on the following pending results: none PCP AND OTHER OUTPATIENT PROVIDERS: SEE BELOW FOR SPECIFIC DISCHARGE INSTRUCTIONS PRINTED FOR PATIENT IN ADDITION TO GENERIC AVS PATIENT INFO  Discharge Instructions     Diet - low sodium heart healthy   Complete by: As directed    Discharge instructions   Complete by: As directed    PCP / SNF attending follow up in 1 week, needs repeat BMP/sodium level checked and decide if need to continue the sodium tablets. Also needs BP follow up - have held her usual home carvedilol, spironolactone, lisinopril. Please ensure outpatient follow up with neurology in the next 2-4 weeks.   Increase activity slowly   Complete by: As directed           Hospital Course: Crystal Lynch is a 81 y.o. female with medical history significant of NPH, HTN, hypothyroidism, HLD, OSA on CPAP, B12 def who has recent history of admission to [facility] for further evaluation of NPH and external lumbar drainage of csf vial lumbar spinal catheter. Per patient s/p evaluation she was noted not to qualify for shunt placement.  Patient states s/p procedure she had HA/Nausea. She states that  she was treated symptomatically, however now at home close to two weeks and has had worsening of symptoms of HA and nausea over the last three days. Due to worsening of symptoms patient presents to ED.   08/16 - 08/17: Found with  hyponatremia with NA of 116 down from base of 136.  Nephrology was called who recommended admission to SDU and starting on 3% saline.  Then was treated with lasix and fluid restriction. Also salt tablets. Liver disease could also contribute to hyponatremia. Patient not a candidate for tolvaptan due to liver disease seen on abdominal ultrasound in February 2023 Sodium levels improving --> stable. Will need to follow-up with outpatient neurologist on discharge. Nephrology recs: continue salt tablets for additional week beyond discharge and follow w/ PCP to determine need for continued  use. Nephro signed off 08/22 SNF pending has been barrier to discharge   Consultants: Nephrology  Procedures: None       Discharge Diagnoses: Active Problems:   Hypertension   Hypercholesterolemia   Hyponatremia   NPH (normal pressure hydrocephalus) (HCC)   OSA (obstructive sleep apnea)   Hypophosphatemia    Assessment & Plan:  Hyponatremia Likely CNS adaptation. Recently been treated for NPH at [facility] with lumbar puncture drain.  Has been having nausea for a while. 3% hypertonic solution, IV lasix --> off this and on po salt tabs and fluid restriction  Not a candidate for tolvaptan due to liver disease seen on abdominal ultrasound in February 2023 Continue current regimen of salt tablets and 1200 fluid restriction    Hypophosphatemia Replaced and stable now   NPH recent admit to [facility]  s/p external lumbar drainage of csf vial lumbar spinal catheter patient not candidate for shunt  prn ELD per d/c recs  8/22 patient will need to follow-up with outpatient neurologist on discharge    HTN Continue amlodipine   Hypothyroidism  Continue Synthroid    HLD statins   OSA    Discharge Instructions  Allergies as of 06/14/2022       Reactions   Meloxicam    BP issues        Medication List     STOP taking these medications    carvedilol 25 MG tablet Commonly  known as: COREG   carvedilol 6.25 MG tablet Commonly known as: COREG   cholecalciferol 25 MCG (1000 UNIT) tablet Commonly known as: VITAMIN D3   lisinopril 40 MG tablet Commonly known as: ZESTRIL   LORazepam 0.5 MG tablet Commonly known as: ATIVAN   spironolactone 25 MG tablet Commonly known as: ALDACTONE       TAKE these medications    acetaminophen 500 MG tablet Commonly known as: TYLENOL Take 1 tablet (500 mg total) by mouth every 6 (six) hours as needed for mild pain (or Fever >/= 101).   amLODipine 5 MG tablet Commonly known as: NORVASC Take 1 tablet (5 mg total) by mouth daily. Start taking on: June 15, 2022   aspirin EC 81 MG tablet Take 1 tablet (81 mg total) by mouth daily. Swallow whole.   Biotin 5 MG Caps Take 5,000 mcg by mouth daily.   bisacodyl 10 MG suppository Commonly known as: DULCOLAX Place 1 suppository (10 mg total) rectally daily as needed for moderate constipation.   dorzolamide-timolol 22.3-6.8 MG/ML ophthalmic solution Commonly known as: COSOPT Place 1 drop into both eyes 2 (two) times daily.   guaiFENesin-dextromethorphan 100-10 MG/5ML syrup Commonly known as: ROBITUSSIN DM Take 5 mLs by mouth every 4 (four) hours as needed for cough.   levothyroxine 75 MCG tablet Commonly known as: SYNTHROID TAKE 1 TABLET BY MOUTH EVERY DAY   pantoprazole 40 MG tablet Commonly known as: PROTONIX TAKE 1 TABLET BY MOUTH EVERY DAY (WAITING ON PA)   polyethylene glycol 17 g packet Commonly known as: MIRALAX / GLYCOLAX Take 17 g by mouth daily. Start taking on: June 15, 2022   rosuvastatin 10 MG tablet Commonly known as: CRESTOR TAKE 1 TABLET BY MOUTH EVERY DAY   sodium chloride 1 g tablet Take 1 tablet (1 g total) by mouth 3 (three) times daily with meals for 7 days. Continue per PCP / pending repeat BMP   ZINC PO Take 1 tablet by mouth daily.         Contact information for after-discharge care     Destination     [facility] Preferred SNF .   Service: Skilled Nursing Contact information: [place] [phone]                     Allergies  Allergen Reactions   Meloxicam     BP issues     Subjective: pt feeling unsteady but no concerns for discharge to SNF from her or her daughter in law, c/o constipation but no abdominal pain, no CP/SOB   Discharge Exam: BP 133/72 (BP Location: Left Arm)   Pulse 97   Temp 98.2 F (36.8 C)   Resp 18   Ht '5\' 2"'$  (1.575 m)   Wt 59.8 kg   LMP 10/21/1984   SpO2 97%   BMI 24.12 kg/m  General: Pt is alert, awake, not in acute distress Cardiovascular: RRR, S1/S2 WNL,  no rubs, no gallops Respiratory: CTA bilaterally, no wheezing, no rhonchi Abdominal: Soft, NT, ND, bowel sounds + Extremities: no edema, no cyanosis     The results of significant diagnostics from this hospitalization (including imaging, microbiology, ancillary and laboratory) are listed below for reference.     Microbiology: No results found for this or any previous visit (from the past 240 hour(s)).   Labs: BNP (last 3 results) No results for input(s): "BNP" in the last 8760 hours. Basic Metabolic Panel: Recent Labs  Lab 06/08/22 0219 06/08/22 0600 06/08/22 1109 06/08/22 1603 06/09/22 0829 06/09/22 1039 06/10/22 1204 06/10/22 1604 06/10/22 1943 06/11/22 0008 06/11/22 0426 06/11/22 0838 06/12/22 0517  NA 127* 127* 127*   < > 121*   < > 127*   < > 128* 129* 131* 130* 132*  K 3.7 3.3* 3.1*  --  3.6  --  3.2*  --   --   --  3.6  --  3.6  CL 99 99 97*  --  91*  --   --   --   --   --  98  --   --   CO2 20* 22 22  --  22  --   --   --   --   --  25  --   --   GLUCOSE 100* 104* 191*  --  93  --   --   --   --   --  120*  --   --   BUN '11 11 13  '$ --  10  --   --   --   --   --  14  --   --   CREATININE 0.59 0.57 0.68  --  0.55  --  0.72  --   --   --  0.72  --   --   CALCIUM 9.2 9.3 9.5  --  9.3  --   --   --   --    --  9.6  --   --   MG  --  1.9  --   --   --   --   --   --   --   --   --   --   --   PHOS  --   --   --   --   --   --   --   --   --   --  2.1*  --  3.4   < > = values in this interval not displayed.   Liver Function Tests: Recent Labs  Lab 06/08/22 1109 06/11/22 0426  AST 24  --   ALT 16  --   ALKPHOS 46  --   BILITOT 3.2*  --   PROT 5.7*  --   ALBUMIN 3.6 3.7   No results for input(s): "LIPASE", "AMYLASE" in the last 168 hours. No results for input(s): "AMMONIA" in the last 168 hours. CBC: Recent Labs  Lab 06/07/22 1950 06/08/22 1109  WBC 9.3 8.3  HGB 14.4 13.7  HCT 40.2 38.6  MCV 86.3 87.5  PLT 301 243   Cardiac Enzymes: No results for input(s): "CKTOTAL", "CKMB", "CKMBINDEX", "TROPONINI" in the last 168 hours. BNP: Invalid input(s): "POCBNP" CBG: No results for input(s): "GLUCAP" in the last 168 hours. D-Dimer No results for input(s): "DDIMER" in the last 72 hours. Hgb A1c No results for input(s): "HGBA1C" in the last 72 hours. Lipid Profile No results for input(s): "CHOL", "HDL", "  [place]", "TRIG", "CHOLHDL", "LDLDIRECT" in the last 72 hours. Thyroid function studies No results for input(s): "TSH", "T4TOTAL", "T3FREE", "THYROIDAB" in the last 72 hours.  Invalid input(s): "FREET3" Anemia work up No results for input(s): "VITAMINB12", "FOLATE", "FERRITIN", "TIBC", "IRON", "RETICCTPCT" in the last 72 hours. Urinalysis    Component Value Date/Time   COLORURINE YELLOW (A) 06/11/2022 0756   APPEARANCEUR CLEAR (A) 06/11/2022 0756   APPEARANCEUR Hazy (A) 05/06/2019 1108   LABSPEC 1.012 06/11/2022 0756   PHURINE 6.0 06/11/2022 0756   GLUCOSEU NEGATIVE 06/11/2022 0756   GLUCOSEU NEGATIVE 09/13/2020 1451   HGBUR MODERATE (A) 06/11/2022 0756   BILIRUBINUR NEGATIVE 06/11/2022 0756   BILIRUBINUR neg 11/25/2021 1022   BILIRUBINUR Negative 05/06/2019 1108   KETONESUR NEGATIVE 06/11/2022 0756   PROTEINUR NEGATIVE 06/11/2022 0756   UROBILINOGEN 0.2 11/25/2021  1022   UROBILINOGEN 0.2 09/13/2020 1451   NITRITE NEGATIVE 06/11/2022 0756   LEUKOCYTESUR NEGATIVE 06/11/2022 0756   Sepsis Labs Recent Labs  Lab 06/07/22 1950 06/08/22 1109  WBC 9.3 8.3   Microbiology No results found for this or any previous visit (from the past 240 hour(s)). Imaging DG Chest Portable 1 View  Result Date: 06/07/2022 CLINICAL DATA:  Weakness. EXAM: PORTABLE CHEST 1 VIEW COMPARISON:  Chest x-ray 02/23/2021 FINDINGS: The heart size and mediastinal contours are within normal limits. Both lungs are clear. The visualized skeletal structures are unremarkable. IMPRESSION: No active disease. Electronically Signed   By: Ronney Asters M.D.   On: 06/07/2022 23:43   CT Head Wo Contrast  Result Date: 06/07/2022 CLINICAL DATA:  Dizziness with nausea and vomiting. EXAM: CT HEAD WITHOUT CONTRAST TECHNIQUE: Contiguous axial images were obtained from the base of the skull through the vertex without intravenous contrast. RADIATION DOSE REDUCTION: This exam was performed according to the departmental dose-optimization program which includes automated exposure control, adjustment of the mA and/or kV according to patient size and/or use of iterative reconstruction technique. COMPARISON:  None Available. FINDINGS: Brain: There is mild cerebral atrophy with widening of the extra-axial spaces and mild to moderate severity ventricular dilatation. There are areas of decreased attenuation within the white matter tracts of the supratentorial brain, consistent with microvascular disease changes. Vascular: No hyperdense vessel or unexpected calcification. Skull: Normal. Negative for fracture or focal lesion. Sinuses/Orbits: No acute finding. Other: None. 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 Electronically Signed   By: Virgina Norfolk M.D.   On: 06/07/2022 20:37      Time coordinating  discharge: Over 30 minutes  SIGNED:  Emeterio Reeve DO [facility]

## 2022-06-14 NOTE — TOC Transition Note (Signed)
Transition of Care [facility]) - CM/SW Discharge Note   Patient Details  Name: Crystal Lynch MRN: [id number] Date of Birth: 1941-07-04  Transition of Care [facility]) CM/SW Contact:  Candie Chroman, LCSW Phone Number: 06/14/2022, 3:13 PM   Clinical Narrative:  Patient has orders to discharge to [facility] today. RN has already called report. EMS transport has been arranged and she is 8th on the list. No further concerns. CSW signing off.   Final next level of care: Skilled Nursing Facility Barriers to Discharge: Barriers Resolved   Patient Goals and CMS Choice   CMS Medicare.gov Compare Post Acute Care list provided to:: Patient (Sister) Choice offered to / list presented to : Patient, Spouse, Adult Children, Sibling  Discharge Placement PASRR number recieved: 06/12/22            Patient chooses bed at: [facility] [place] Patient to be transferred to facility by: EMS Name of family member notified: Irineo Axon Patient and family notified of of transfer: 06/14/22  Discharge Plan and Services     Post Acute Care Choice: [place]                               Social Determinants of Health (SDOH) Interventions     Readmission Risk Interventions     No data to display

## 2022-06-14 NOTE — Hospital Course (Addendum)
Crystal Lynch is a 81 y.o. female with medical history significant of NPH, HTN, hypothyroidism, HLD, OSA on CPAP, B12 def who has recent history of admission to [facility] for further evaluation of NPH and external lumbar drainage of csf vial lumbar spinal catheter. Per patient s/p evaluation she was noted not to qualify for shunt placement.  Patient states s/p procedure she had HA/Nausea. She states that  she was treated symptomatically, however now at home close to two weeks and has had worsening of symptoms of HA and nausea over the last three days. Due to worsening of symptoms patient presents to ED.   08/16 - 08/17: Found with hyponatremia with NA of 116 down from base of 136.  Nephrology was called who recommended admission to SDU and starting on 3% saline.  Then was treated with lasix and fluid restriction. Also salt tablets. Liver disease could also contribute to hyponatremia. Patient not a candidate for tolvaptan due to liver disease seen on abdominal ultrasound in February 2023 Sodium levels improving --> stable. Will need to follow-up with outpatient neurologist on discharge. Nephrology recs: continue salt tablets for additional week beyond discharge and follow w/ PCP to determine need for continued  use. Nephro signed off 08/22 SNF pending has been barrier to discharge   Consultants: Nephrology  Procedures: None

## 2022-06-14 NOTE — TOC Progression Note (Signed)
Transition of Care [facility]) - Progression Note    Patient Details  Name: Crystal Lynch MRN: [id number] Date of Birth: 10/03/1941  Transition of Care [facility]) CM/SW [place], LCSW Phone Number: 06/14/2022, 9:02 AM  Clinical Narrative:  Josem Kaufmann approved: [id number]. Valid 8/23-8/25. Left message for [facility] admissions coordinator to notify and told her to follow up once CPAP delivered so discharge can be facilitated.   Expected Discharge Plan: Skilled Nursing Facility Barriers to Discharge: Ship broker, SNF Pending bed offer  Expected Discharge Plan and Services Expected Discharge Plan: [place] Choice: [place] arrangements for the past 2 months: Single Family Home                                       Social Determinants of Health (SDOH) Interventions    Readmission Risk Interventions     No data to display

## 2022-06-14 NOTE — Plan of Care (Signed)
  Problem: Education: Goal: Knowledge of General Education information will improve Description: Including pain rating scale, medication(s)/side effects and non-pharmacologic comfort measures Outcome: Adequate for Discharge   Problem: Health Behavior/Discharge Planning: Goal: Ability to manage health-related needs will improve Outcome: Adequate for Discharge   Problem: Clinical Measurements: Goal: Ability to maintain clinical measurements within normal limits will improve Outcome: Adequate for Discharge Goal: Will remain free from infection Outcome: Adequate for Discharge Goal: Diagnostic test results will improve Outcome: Adequate for Discharge Goal: Respiratory complications will improve Outcome: Adequate for Discharge Goal: Cardiovascular complication will be avoided Outcome: Adequate for Discharge   Problem: Activity: Goal: Risk for activity intolerance will decrease Outcome: Adequate for Discharge   Problem: Nutrition: Goal: Adequate nutrition will be maintained Outcome: Adequate for Discharge   Problem: Coping: Goal: Level of anxiety will decrease Outcome: Adequate for Discharge   Problem: Elimination: Goal: Will not experience complications related to bowel motility Outcome: Adequate for Discharge Goal: Will not experience complications related to urinary retention Outcome: Adequate for Discharge   Problem: Pain Managment: Goal: General experience of comfort will improve Outcome: Adequate for Discharge   Problem: Safety: Goal: Ability to remain free from injury will improve Outcome: Adequate for Discharge   Problem: Skin Integrity: Goal: Risk for impaired skin integrity will decrease Outcome: Adequate for Discharge   Problem: Acute Rehab OT Goals (only OT should resolve) Goal: Pt. Will Perform Grooming Outcome: Adequate for Discharge Goal: Pt. Will Perform Upper Body Dressing Outcome: Adequate for Discharge Goal: Pt. Will Transfer To Toilet Outcome:  Adequate for Discharge Goal: Pt/Caregiver Will Perform Home Exercise Program Outcome: Adequate for Discharge

## 2022-06-18 ENCOUNTER — Encounter: Payer: Self-pay | Admitting: Internal Medicine

## 2022-06-19 ENCOUNTER — Ambulatory Visit: Payer: Medicare Other | Admitting: Physical Therapy

## 2022-06-20 NOTE — Telephone Encounter (Signed)
Pt daughter n law called wanting to see if the mychart message that the pt husband left was recieved

## 2022-06-21 ENCOUNTER — Ambulatory Visit: Payer: Medicare Other

## 2022-06-21 NOTE — Telephone Encounter (Signed)
Called Mr Tornow and discussed.  Will confirm study.  Contact neurology regarding scan.

## 2022-06-22 NOTE — Telephone Encounter (Signed)
Please call Crystal Lynch and let him know that I contacted neurology about the scan.  They will be calling to schedule an appt - to discuss and order the scan.

## 2022-06-23 ENCOUNTER — Other Ambulatory Visit: Payer: Self-pay | Admitting: Internal Medicine

## 2022-06-27 ENCOUNTER — Ambulatory Visit: Payer: Medicare Other

## 2022-06-29 ENCOUNTER — Ambulatory Visit: Payer: Medicare Other

## 2022-07-03 ENCOUNTER — Ambulatory Visit: Payer: Medicare Other

## 2022-07-04 ENCOUNTER — Ambulatory Visit (INDEPENDENT_AMBULATORY_CARE_PROVIDER_SITE_OTHER): Payer: Medicare Other | Admitting: Internal Medicine

## 2022-07-04 ENCOUNTER — Encounter: Payer: Self-pay | Admitting: Internal Medicine

## 2022-07-04 ENCOUNTER — Telehealth: Payer: Self-pay | Admitting: Internal Medicine

## 2022-07-04 VITALS — BP 120/70 | HR 80 | Temp 97.4°F | Resp 14 | Ht 62.0 in | Wt 123.2 lb

## 2022-07-04 DIAGNOSIS — I10 Essential (primary) hypertension: Secondary | ICD-10-CM | POA: Diagnosis not present

## 2022-07-04 DIAGNOSIS — Z23 Encounter for immunization: Secondary | ICD-10-CM

## 2022-07-04 DIAGNOSIS — K760 Fatty (change of) liver, not elsewhere classified: Secondary | ICD-10-CM

## 2022-07-04 DIAGNOSIS — I700 Atherosclerosis of aorta: Secondary | ICD-10-CM | POA: Diagnosis not present

## 2022-07-04 DIAGNOSIS — E871 Hypo-osmolality and hyponatremia: Secondary | ICD-10-CM | POA: Diagnosis not present

## 2022-07-04 DIAGNOSIS — E7800 Pure hypercholesterolemia, unspecified: Secondary | ICD-10-CM

## 2022-07-04 DIAGNOSIS — E039 Hypothyroidism, unspecified: Secondary | ICD-10-CM

## 2022-07-04 DIAGNOSIS — G912 (Idiopathic) normal pressure hydrocephalus: Secondary | ICD-10-CM

## 2022-07-04 DIAGNOSIS — R2681 Unsteadiness on feet: Secondary | ICD-10-CM

## 2022-07-04 DIAGNOSIS — K209 Esophagitis, unspecified without bleeding: Secondary | ICD-10-CM

## 2022-07-04 DIAGNOSIS — R413 Other amnesia: Secondary | ICD-10-CM

## 2022-07-04 LAB — BASIC METABOLIC PANEL
BUN: 16 mg/dL (ref 6–23)
CO2: 25 mEq/L (ref 19–32)
Calcium: 10.1 mg/dL (ref 8.4–10.5)
Chloride: 101 mEq/L (ref 96–112)
Creatinine, Ser: 0.7 mg/dL (ref 0.40–1.20)
GFR: 80.46 mL/min (ref >60.00)
Glucose, Bld: 88 mg/dL (ref 70–99)
Potassium: 3.8 mEq/L (ref 3.5–5.1)
Sodium: 137 mEq/L (ref 135–145)

## 2022-07-04 LAB — HEPATIC FUNCTION PANEL
ALT: 18 U/L (ref 0–35)
AST: 15 U/L (ref 0–37)
Albumin: 4.1 g/dL (ref 3.5–5.2)
Alkaline Phosphatase: 58 U/L (ref 39–117)
Bilirubin, Direct: 0.3 mg/dL (ref 0.0–0.3)
Total Bilirubin: 1.6 mg/dL — ABNORMAL HIGH (ref 0.2–1.2)
Total Protein: 6.3 g/dL (ref 6.0–8.3)

## 2022-07-04 NOTE — Progress Notes (Signed)
Patient ID: Crystal Lynch, female   DOB: Apr 17, 1941, 81 y.o.   MRN: [id number]   Subjective:    Patient ID: Crystal Lynch, female    DOB: 12-28-1940, 81 y.o.   MRN: [id number]   Patient here for  Chief Complaint  Patient presents with   Hospitalization Follow-up    [facility] 8/16/-8/23 for hypernatremia. Pt states she is doing better went to rehab for 2 wks [facility]  resources. In process of having home care.    Marland Kitchen   HPI Admitted 06/07/22 - 06/14/22 - worsening headache and nausea.  Found to have sodium 116.  Nephrology recommended admission and 3% saline.  Treated with lasix and fluid restriction.  Also salt tablets.  Was discharged to SNF.  Just discharged to home a few days ago.  She is accompanied by her husband.  History obtained from both of them.  She reports feels better since home.  Husband reports she is ambulating better.  No chest pain or sob reported.  Eating.  No vomiting.  No abdominal pain or bowel change reported.  Has been off salt tablets since 06/21/22.  Off all of her blood pressure medications except for amlodipine.  Memory is an issue.     Past Medical History:  Diagnosis Date   Arthritis    Chicken pox    Endometriosis    Esophagitis    s/p esophageal dilatation   GERD (gastroesophageal reflux disease)    History of ovarian cyst    Hypercholesterolemia    Hypertension    Hypothyroidism    Past Surgical History:  Procedure Laterality Date   ABDOMINAL SURGERY     found to have an ovarian cyst and endometriosis   APPENDECTOMY     Family History  Problem Relation Age of Onset   Lung disease Father    Heart attack Father    Heart disease Mother        myocardial infarction   Breast cancer Mother 43   Heart attack Mother    Hypertension Mother    Hyperlipidemia Mother    Breast cancer Sister 20   Hypertension Sister    Rheum arthritis Sister    Breast cancer Maternal Aunt 70   Hypertension Brother    Colon cancer Neg Hx    Social History   Socioeconomic  History   Marital status: Married    Spouse name: Not on file   Number of children: Not on file   Years of education: Not on file   Highest education level: Not on file  Occupational History   Not on file  Tobacco Use   Smoking status: Never   Smokeless tobacco: Never  Vaping Use   Vaping Use: Never used  Substance and Sexual Activity   Alcohol use: No    Alcohol/week: 0.0 standard drinks of alcohol   Drug use: No   Sexual activity: Never  Other Topics Concern   Not on file  Social History Narrative   Not on file   Social Determinants of Health   Financial Resource Strain: Low Risk  (04/28/2022)   Overall Financial Resource Strain (CARDIA)    Difficulty of Paying Living Expenses: Not hard at all  Food Insecurity: No Food Insecurity (04/28/2022)   Hunger Vital Sign    Worried About Running Out of Food in the Last Year: Never true    Ran Out of Food in the Last Year: Never true  Transportation Needs: No Transportation Needs (04/28/2022)   PRAPARE - Transportation  Lack of Transportation (Medical): No    Lack of Transportation (Non-Medical): No  Physical Activity: Insufficiently Active (04/28/2022)   Exercise Vital Sign    Days of Exercise per Week: 7 days    Minutes of Exercise per Session: 10 min  Stress: No Stress Concern Present (04/28/2022)   [place]    Feeling of Stress : Not at all  Social Connections: Unknown (04/28/2022)   Social Connection and Isolation Panel [NHANES]    Frequency of Communication with Friends and Family: More than three times a week    Frequency of Social Gatherings with Friends and Family: More than three times a week    Attends Religious Services: More than 4 times per year    Active Member of [employer] or Organizations: Not on file    Attends Archivist Meetings: Not on file    Marital Status: Married     Review of Systems  Constitutional:  Negative for appetite change  and fever.  HENT:  Negative for congestion and sinus pressure.   Respiratory:  Negative for cough, chest tightness and shortness of breath.   Cardiovascular:  Negative for chest pain and palpitations.       Pedal and ankle edema.   Gastrointestinal:  Negative for abdominal pain, diarrhea, nausea and vomiting.  Genitourinary:  Negative for difficulty urinating and dysuria.  Musculoskeletal:  Negative for joint swelling and myalgias.  Skin:  Negative for color change and rash.  Neurological:  Negative for dizziness and headaches.  Psychiatric/Behavioral:  Negative for agitation and dysphoric mood.        Objective:     BP 120/70 (BP Location: Left Arm, Patient Position: Sitting, Cuff Size: Normal)   Pulse 80   Temp (!) 97.4 F (36.3 C) (Oral)   Resp 14   Ht '5\' 2"'$  (1.575 m)   Wt 123 lb 3.2 oz (55.9 kg)   LMP 10/21/1984   SpO2 98%   BMI 22.53 kg/m  Wt Readings from Last 3 Encounters:  07/04/22 123 lb 3.2 oz (55.9 kg)  06/14/22 131 lb 14.4 oz (59.8 kg)  04/28/22 126 lb (57.2 kg)    Physical Exam Vitals reviewed.  Constitutional:      General: She is not in acute distress.    Appearance: Normal appearance.  HENT:     Head: Normocephalic and atraumatic.     Right Ear: External ear normal.     Left Ear: External ear normal.  Eyes:     General: No scleral icterus.       Right eye: No discharge.        Left eye: No discharge.     Conjunctiva/sclera: Conjunctivae normal.  Neck:     Thyroid: No thyromegaly.  Cardiovascular:     Rate and Rhythm: Normal rate and regular rhythm.  Pulmonary:     Effort: No respiratory distress.     Breath sounds: Normal breath sounds. No wheezing.  Abdominal:     General: Bowel sounds are normal.     Palpations: Abdomen is soft.     Tenderness: There is no abdominal tenderness.  Musculoskeletal:        General: No swelling or tenderness.     Cervical back: Neck supple. No tenderness.     Comments: Unsteady gait.   Lymphadenopathy:      Cervical: No cervical adenopathy.  Skin:    Findings: No erythema or rash.  Neurological:     Mental Status:  She is alert.  Psychiatric:        Mood and Affect: Mood normal.        Behavior: Behavior normal.    Reviewed and confirm medications.   Outpatient Encounter Medications as of 07/04/2022  Medication Sig   acetaminophen (TYLENOL) 500 MG tablet Take 1 tablet (500 mg total) by mouth every 6 (six) hours as needed for mild pain (or Fever >/= 101).   amLODipine (NORVASC) 5 MG tablet Take 1 tablet (5 mg total) by mouth daily.   aspirin 81 MG EC tablet Take 1 tablet (81 mg total) by mouth daily. Swallow whole.   Biotin 5 MG CAPS Take 5,000 mcg by mouth daily.   bisacodyl (DULCOLAX) 10 MG suppository Place 1 suppository (10 mg total) rectally daily as needed for moderate constipation.   dorzolamide-timolol (COSOPT) 22.3-6.8 MG/ML ophthalmic solution Place 1 drop into both eyes 2 (two) times daily.   guaiFENesin-dextromethorphan (ROBITUSSIN DM) 100-10 MG/5ML syrup Take 5 mLs by mouth every 4 (four) hours as needed for cough.   levothyroxine (SYNTHROID) 75 MCG tablet TAKE 1 TABLET BY MOUTH EVERY DAY   Multiple Vitamins-Minerals (ZINC PO) Take 1 tablet by mouth daily.   pantoprazole (PROTONIX) 40 MG tablet TAKE 1 TABLET BY MOUTH EVERY DAY (WAITING ON PA)   polyethylene glycol (MIRALAX / GLYCOLAX) 17 g packet Take 17 g by mouth daily.   rosuvastatin (CRESTOR) 10 MG tablet TAKE 1 TABLET BY MOUTH EVERY DAY   No facility-administered encounter medications on file as of 07/04/2022.     Lab Results  Component Value Date   WBC 8.3 06/08/2022   HGB 13.7 06/08/2022   HCT 38.6 06/08/2022   PLT 243 06/08/2022   GLUCOSE 88 07/04/2022   CHOL 148 03/16/2022   TRIG 110.0 03/16/2022   HDL 60.30 03/16/2022   LDLDIRECT 88.0 03/22/2021   LDLCALC 66 03/16/2022   ALT 18 07/04/2022   AST 15 07/04/2022   NA 137 07/04/2022   K 3.8 07/04/2022   CL 101 07/04/2022   CREATININE 0.70 07/04/2022   BUN 16  07/04/2022   CO2 25 07/04/2022   TSH 2.572 06/08/2022   INR 1.0 08/12/2015    DG Chest Portable 1 View  Result Date: 06/07/2022 CLINICAL DATA:  Weakness. EXAM: PORTABLE CHEST 1 VIEW COMPARISON:  Chest x-ray 02/23/2021 FINDINGS: The heart size and mediastinal contours are within normal limits. Both lungs are clear. The visualized skeletal structures are unremarkable. IMPRESSION: No active disease. Electronically Signed   By: Ronney Asters M.D.   On: 06/07/2022 23:43   CT Head Wo Contrast  Result Date: 06/07/2022 CLINICAL DATA:  Dizziness with nausea and vomiting. EXAM: CT HEAD WITHOUT CONTRAST TECHNIQUE: Contiguous axial images were obtained from the base of the skull through the vertex without intravenous contrast. RADIATION DOSE REDUCTION: This exam was performed according to the departmental dose-optimization program which includes automated exposure control, adjustment of the mA and/or kV according to patient size and/or use of iterative reconstruction technique. COMPARISON:  None Available. FINDINGS: Brain: There is mild cerebral atrophy with widening of the extra-axial spaces and mild to moderate severity ventricular dilatation. There are areas of decreased attenuation within the white matter tracts of the supratentorial brain, consistent with microvascular disease changes. Vascular: No hyperdense vessel or unexpected calcification. Skull: Normal. Negative for fracture or focal lesion. Sinuses/Orbits: No acute finding. Other: None. 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 Electronically Signed  By: Virgina Norfolk M.D.   On: 06/07/2022 20:37       Assessment & Plan:   Problem List Items Addressed This Visit     Aortic atherosclerosis ([place])    Continue crestor.        Esophagitis    Continue protonix.       Fatty liver    Found on ultrasound.  Follow liver function tests.         Hypercholesterolemia    Low cholesterol diet and exercise.  Continue crestor.  Follow lipid panel and liver function tests.        Hypertension - Primary    Only on amlodipine currently.  Blood pressure on recheck with systolic reading 024O.  Is off coreg, lisinopril and aldactone.  Discussed probable need to add back medication in future.  Given blood pressures today, will hold on additional medication.  Have them spot check her pressures.  Send in readings.  Recheck metabolic panel.       Hyponatremia    Recheck sodium today.  Off salt tablets.       Relevant Orders   Hepatic function panel (Completed)   Basic metabolic panel (Completed)   Hypothyroidism    On thyroid replacement.  Follow tsh.       Memory change    Has been evaluated at [facility] as outlined.  Did not qualify for shunt.  Seeing neurology.  Continue f/u with Dr Manuella Ghazi.       NPH (normal pressure hydrocephalus) (HCC)    Recent history of admission to [facility] for further evaluation of NPH and external lumbar drainage of csf vial lumbar spinal catheter. Per patient s/p evaluation she was noted not to qualify for shunt placement. Seeing neurology here.       Unsteady gait    Just released from SNF.  Home health PT to continue to evaluate and treat.       Other Visit Diagnoses     Need for immunization against influenza       Relevant Orders   Flu Vaccine QUAD High Dose(Fluad) (Completed)        Einar Pheasant, MD

## 2022-07-04 NOTE — Telephone Encounter (Signed)
[place] nurse, stated that patient has bi-lateral pitting of edema in both feet out side of feet. Kathy's number is [phone].

## 2022-07-05 ENCOUNTER — Ambulatory Visit: Payer: Medicare Other

## 2022-07-10 ENCOUNTER — Ambulatory Visit: Payer: Medicare Other

## 2022-07-10 ENCOUNTER — Encounter: Payer: Self-pay | Admitting: Internal Medicine

## 2022-07-10 DIAGNOSIS — R2681 Unsteadiness on feet: Secondary | ICD-10-CM | POA: Insufficient documentation

## 2022-07-10 NOTE — Assessment & Plan Note (Signed)
Has been evaluated at [facility] as outlined.  Did not qualify for shunt.  Seeing neurology.  Continue f/u with Dr Manuella Ghazi.

## 2022-07-10 NOTE — Assessment & Plan Note (Signed)
Continue protonix  

## 2022-07-10 NOTE — Assessment & Plan Note (Signed)
Recent history of admission to [facility] for further evaluation of NPH and external lumbar drainage of csf vial lumbar spinal catheter. Per patient s/p evaluation she was noted not to qualify for shunt placement. Seeing neurology here.

## 2022-07-10 NOTE — Assessment & Plan Note (Signed)
On thyroid replacement.  Follow tsh.  

## 2022-07-10 NOTE — Assessment & Plan Note (Signed)
Recheck sodium today.  Off salt tablets.

## 2022-07-10 NOTE — Assessment & Plan Note (Signed)
Found on ultrasound.  Follow liver function tests.   

## 2022-07-10 NOTE — Assessment & Plan Note (Signed)
Low cholesterol diet and exercise.  Continue crestor.  Follow lipid panel and liver function tests.   

## 2022-07-10 NOTE — Assessment & Plan Note (Signed)
Just released from SNF.  Home health PT to continue to evaluate and treat.

## 2022-07-10 NOTE — Assessment & Plan Note (Signed)
Continue crestor 

## 2022-07-10 NOTE — Assessment & Plan Note (Signed)
Only on amlodipine currently.  Blood pressure on recheck with systolic reading 112T.  Is off coreg, lisinopril and aldactone.  Discussed probable need to add back medication in future.  Given blood pressures today, will hold on additional medication.  Have them spot check her pressures.  Send in readings.  Recheck metabolic panel.

## 2022-07-11 ENCOUNTER — Telehealth: Payer: Self-pay | Admitting: Internal Medicine

## 2022-07-11 DIAGNOSIS — I10 Essential (primary) hypertension: Secondary | ICD-10-CM

## 2022-07-11 NOTE — Telephone Encounter (Signed)
Patient is very tired and wanted to know kind of supplement she could take, she is lacking energy. When she was in the hospital they took her off all her medications, she would like to know if she needs to start back.

## 2022-07-12 ENCOUNTER — Ambulatory Visit: Payer: Medicare Other

## 2022-07-12 MED ORDER — CARVEDILOL 6.25 MG PO TABS: 6.2500 mg | ORAL_TABLET | Freq: Two times a day (BID) | ORAL | 3 refills | Status: DC

## 2022-07-12 NOTE — Telephone Encounter (Addendum)
Patient and DPR aware of medication addition Carvedilol 6.25 mg BID continue amlodipine 5 mg and to monitor BP and send readings. Script sent.

## 2022-07-12 NOTE — Telephone Encounter (Signed)
All of her blood pressure medications were held (while in hospital) except for amlodipine.  Her blood pressure was ok at her f/u appt.  At her appt, I discussed with her and her husband regarding following her blood pressure and notifying me of readings.  (That we may need to add back medication if blood pressure started increasing).  Need to know how her blood pressure is doing.  Will adjust medication based on readings.  Also, need to confirm eating ok.

## 2022-07-12 NOTE — Telephone Encounter (Signed)
Spoke with patient says she is eating but they are small meals 3 times daily, patient ask okay to try maybe some Ensure to help with energy advised okay as long as not lactose intolerant or allergic to dairy, patient advised she is not and will be starting these in addition to meals. Patient BP readings 9/14 = 152/9 pulse 74 AM reading,  9/15 = 162/75 pulse 79 at 6 PM, 9/16 = 160/78 pulse 69 9:30 AM, 9/17 = 158/73 pulse 70 AM reading, 9/18 = 156/73 pulse 70 AT 1 PM. Patient taking Amlodipine 5 mg daily.

## 2022-07-12 NOTE — Telephone Encounter (Signed)
Reviewed blood pressures.  Have her continue amlodipine and restart carvedilol 6.'25mg'$  bid.  (Please notify her husband restarting).  Continue to monitor her pressures and send in readings.

## 2022-07-12 NOTE — Addendum Note (Signed)
Addended by: Nanci Pina on: 07/12/2022 12:52 PM   Modules accepted: Orders

## 2022-07-15 ENCOUNTER — Other Ambulatory Visit: Payer: Self-pay | Admitting: Internal Medicine

## 2022-07-15 DIAGNOSIS — E039 Hypothyroidism, unspecified: Secondary | ICD-10-CM

## 2022-07-17 ENCOUNTER — Ambulatory Visit: Payer: Medicare Other

## 2022-07-17 ENCOUNTER — Telehealth: Payer: Self-pay | Admitting: Internal Medicine

## 2022-07-17 NOTE — Telephone Encounter (Signed)
Juliann Pulse form [facility] called stating pt is having BP that is very high and is complaining about dizziness. I asked home health nurse if the pt need to go to the hospital. The nurse stated no and stated pt BP medication has been lowered to 6.5 mg. Home health nurse request recommendation from doctor and to call the family home so the pt would know what to do Right arm 180/70

## 2022-07-17 NOTE — Telephone Encounter (Signed)
I called and s/w patient as well as husband. Pt stated that he dizziness is nothing new & that you are aware. She said that she never knows day to day if she will have it or if she will be fine. She is dizzy today however. BP since starting the Carvedilol 6.25 mg BID this weekend BP has went up. The systolic number increasing to 170's high as 180. Pt's husband stated that when she was told to go back on the Carvedilol that she had the 25 mg dose BID & that's what she was taking prior to the 6.25 mg. Her BP was much better he stated as systolic was 502'D. Husband wondering should dose be increased back? Pt is not having any sx of heart attack w/ no SOB, CP, left arm pain numbness/tingling or jaw pain.   They also inquired had you heard back from Dr. Manuella Ghazi & was wondering about a medication to help with patient's memory loss?

## 2022-07-17 NOTE — Telephone Encounter (Signed)
If she is on 6.'25mg'$  bid coreg now, then increase to 12.'5mg'$  bid.  Check blood pressures and send in readings.  Confirm still taking amlodipine as well.  We will probably need to continue to increase dose and may need to start one of her other medications if continues to increase.  Start with this first and we will see how she responds.

## 2022-07-17 NOTE — Telephone Encounter (Signed)
I called and spoke with patient's husband. He will have patient started the carvedilol 12.5 mg by taking 2 tablets of the 6.25 mg they just picked up for the next few days. By the end of the week they will report back BP's & how patient is feeling. He was also advised that patient may eventually need to titrate back to the 25 mg dose of carvedilol plus on of the other BP medications she was on prior. He verbalized understanding of this.

## 2022-07-18 ENCOUNTER — Other Ambulatory Visit: Payer: Self-pay | Admitting: Cardiovascular Disease

## 2022-07-18 DIAGNOSIS — I10 Essential (primary) hypertension: Secondary | ICD-10-CM

## 2022-07-19 ENCOUNTER — Ambulatory Visit: Payer: Medicare Other

## 2022-07-19 ENCOUNTER — Other Ambulatory Visit (HOSPITAL_BASED_OUTPATIENT_CLINIC_OR_DEPARTMENT_OTHER): Payer: Self-pay | Admitting: Osteopathic Medicine

## 2022-07-21 ENCOUNTER — Telehealth: Payer: Self-pay

## 2022-07-21 DIAGNOSIS — I10 Essential (primary) hypertension: Secondary | ICD-10-CM | POA: Diagnosis not present

## 2022-07-21 DIAGNOSIS — I700 Atherosclerosis of aorta: Secondary | ICD-10-CM

## 2022-07-21 DIAGNOSIS — R413 Other amnesia: Secondary | ICD-10-CM | POA: Diagnosis not present

## 2022-07-21 DIAGNOSIS — E871 Hypo-osmolality and hyponatremia: Secondary | ICD-10-CM | POA: Diagnosis not present

## 2022-07-21 DIAGNOSIS — K2100 Gastro-esophageal reflux disease with esophagitis, without bleeding: Secondary | ICD-10-CM

## 2022-07-21 DIAGNOSIS — E039 Hypothyroidism, unspecified: Secondary | ICD-10-CM

## 2022-07-21 DIAGNOSIS — E7800 Pure hypercholesterolemia, unspecified: Secondary | ICD-10-CM

## 2022-07-21 DIAGNOSIS — M1990 Unspecified osteoarthritis, unspecified site: Secondary | ICD-10-CM

## 2022-07-21 DIAGNOSIS — K760 Fatty (change of) liver, not elsewhere classified: Secondary | ICD-10-CM

## 2022-07-21 DIAGNOSIS — G4733 Obstructive sleep apnea (adult) (pediatric): Secondary | ICD-10-CM

## 2022-07-21 DIAGNOSIS — E538 Deficiency of other specified B group vitamins: Secondary | ICD-10-CM

## 2022-07-21 DIAGNOSIS — Z7982 Long term (current) use of aspirin: Secondary | ICD-10-CM

## 2022-07-21 DIAGNOSIS — G912 (Idiopathic) normal pressure hydrocephalus: Secondary | ICD-10-CM | POA: Diagnosis not present

## 2022-07-21 DIAGNOSIS — Z9181 History of falling: Secondary | ICD-10-CM

## 2022-07-21 NOTE — Telephone Encounter (Signed)
Patient states she has had constant dizziness this week, including today.  Patient states her vision is blurry, but it stays this way and Dr. Einar Pheasant is aware of both of these conditions.  Patient states her husband thinks patient's blood pressure medication,  carvedilol (COREG) 6.25 MG tablet, may be causing a problem.  Dr. Nicki Reaper asked patient to please let her know today if she is still dizzy.  Patient requested call back.  *Patient states her preferred pharmacy is [facility] on [address].

## 2022-07-21 NOTE — Telephone Encounter (Signed)
They have been recording blood pressures.  Please call and see how her blood pressure is doing.

## 2022-07-21 NOTE — Telephone Encounter (Signed)
I called and spoke with the patients husband and he gave me  BP readings:  09/26 11 am  142/70 HR 63 09/27 12 pm   136/61 HR 67 09/28 11:45 am 158/62  HR 64 09/29  2 pm  145/60  HR 64 Also husband stated this was mentioned before at a visit, the patient needs something for anxiety  sent in and the patient is still having a lot of dizziness.  Lakai Moree,cma

## 2022-07-24 ENCOUNTER — Telehealth (INDEPENDENT_AMBULATORY_CARE_PROVIDER_SITE_OTHER): Payer: Medicare Other | Admitting: Internal Medicine

## 2022-07-24 ENCOUNTER — Ambulatory Visit: Payer: Medicare Other

## 2022-07-24 ENCOUNTER — Encounter: Payer: Self-pay | Admitting: Internal Medicine

## 2022-07-24 DIAGNOSIS — E7800 Pure hypercholesterolemia, unspecified: Secondary | ICD-10-CM

## 2022-07-24 DIAGNOSIS — F439 Reaction to severe stress, unspecified: Secondary | ICD-10-CM

## 2022-07-24 DIAGNOSIS — E871 Hypo-osmolality and hyponatremia: Secondary | ICD-10-CM

## 2022-07-24 DIAGNOSIS — I700 Atherosclerosis of aorta: Secondary | ICD-10-CM | POA: Diagnosis not present

## 2022-07-24 DIAGNOSIS — I10 Essential (primary) hypertension: Secondary | ICD-10-CM

## 2022-07-24 DIAGNOSIS — E039 Hypothyroidism, unspecified: Secondary | ICD-10-CM

## 2022-07-24 DIAGNOSIS — G912 (Idiopathic) normal pressure hydrocephalus: Secondary | ICD-10-CM

## 2022-07-24 NOTE — Telephone Encounter (Signed)
Pt notified & unable to come in office at 9:30, but was agreeable to a mychart video visit at 9:30. Appt scheduled

## 2022-07-24 NOTE — Progress Notes (Unsigned)
Patient ID: Crystal Lynch, female   DOB: [birth year], 81 y.o.   MRN: [id number]   Virtual Visit via video Note   All issues noted in this document were discussed and addressed.  No physical exam was performed (except for noted visual exam findings with Video Visits).   I connected with Crystal Lynch by a video enabled telemedicine application and verified that I am speaking with the correct person using two identifiers. Location patient: home Location provider: work  Persons participating in the virtual visit: patient, provider and caretaker and therapist   The limitations, risks, security and privacy concerns of performing an evaluation and management service by video and the availability of in person appointments have been discussed.  It has also been discussed with the patient that there may be a patient responsible charge related to this service. The patient expressed understanding and agreed to proceed.   Reason for visit: work in appt  HPI: Work in with concerns regarding increased anxiety.  Increased stress. . she had a recent admission to [facility] for further evaluation of NPH - external lumbar drainage  - lumbar spinal catheter. Did not qualify for shunt placement.  S/p procedure she developed HA/Nausea.  These symptoms persisted and went to our ER - sodium 116.  Nephrology was called who recommended admission to SDU and starting on 3% saline.  Then treated with lasix and fluid restriction. Also salt tablets.  sodium improved.  most of her blood pressure medications were held and she is currently only on amlodipine and carvedilol.  Blood pressure doing better.  Trending down.  Discussed may need to continue to adjust. Given her recent decline in her physical capability and some cognitive decline, she is experiencing some increased anxiety, stress and depression.  Feels needs something to help calm her down. Having persistent dizziness.  Extensive w/up as outlined.  Discussed treatment  options.    ROS: See pertinent positives and negatives per HPI.  Past Medical History:  Diagnosis Date   Arthritis    Chicken pox    Endometriosis    Esophagitis    s/p esophageal dilatation   GERD (gastroesophageal reflux disease)    History of ovarian cyst    Hypercholesterolemia    Hypertension    Hypothyroidism     Past Surgical History:  Procedure Laterality Date   ABDOMINAL SURGERY     found to have an ovarian cyst and endometriosis   APPENDECTOMY      Family History  Problem Relation Age of Onset   Lung disease Father    Heart attack Father    Heart disease Mother        myocardial infarction   Breast cancer Mother 54   Heart attack Mother    Hypertension Mother    Hyperlipidemia Mother    Breast cancer Sister 14   Hypertension Sister    Rheum arthritis Sister    Breast cancer Maternal Aunt 30   Hypertension Brother    Colon cancer Neg Hx     SOCIAL HX: reviewed.    Current Outpatient Medications:    acetaminophen (TYLENOL) 500 MG tablet, Take 1 tablet (500 mg total) by mouth every 6 (six) hours as needed for mild pain (or Fever >/= 101)., Disp: 30 tablet, Rfl: 0   amLODipine (NORVASC) 5 MG tablet, Take 1 tablet (5 mg total) by mouth daily., Disp: 30 tablet, Rfl: 0   aspirin 81 MG EC tablet, Take 1 tablet (81 mg total) by mouth daily. Swallow  whole., Disp: 30 tablet, Rfl: 0   Biotin 5 MG CAPS, Take 5,000 mcg by mouth daily., Disp: , Rfl:    carvedilol (COREG) 6.25 MG tablet, Take 1 tablet (6.25 mg total) by mouth 2 (two) times daily with a meal. (Patient taking differently: Take 12.5 mg by mouth 2 (two) times daily with a meal.), Disp: 180 tablet, Rfl: 3   dorzolamide-timolol (COSOPT) 22.3-6.8 MG/ML ophthalmic solution, Place 1 drop into both eyes 2 (two) times daily., Disp: , Rfl:    guaiFENesin-dextromethorphan (ROBITUSSIN DM) 100-10 MG/5ML syrup, Take 5 mLs by mouth every 4 (four) hours as needed for cough., Disp: 118 mL, Rfl: 0   levothyroxine  (SYNTHROID) 75 MCG tablet, TAKE 1 TABLET BY MOUTH EVERY DAY, Disp: 90 tablet, Rfl: 1   Multiple Vitamins-Minerals (ZINC PO), Take 1 tablet by mouth daily., Disp: , Rfl:    pantoprazole (PROTONIX) 40 MG tablet, TAKE 1 TABLET BY MOUTH EVERY DAY (WAITING ON PA), Disp: 90 tablet, Rfl: 1   polyethylene glycol (MIRALAX / GLYCOLAX) 17 g packet, Take 17 g by mouth daily., Disp: 14 each, Rfl: 0   rosuvastatin (CRESTOR) 10 MG tablet, TAKE 1 TABLET BY MOUTH EVERY DAY, Disp: 90 tablet, Rfl: 1  EXAM:  GENERAL: alert, oriented, appears well and in no acute distress  HEENT: atraumatic, conjunttiva clear, no obvious abnormalities on inspection of external nose and ears  NECK: normal movements of the head and neck  LUNGS: on inspection no signs of respiratory distress, breathing rate appears normal, no obvious gross SOB, gasping or wheezing  CV: no obvious cyanosis  PSYCH/NEURO: pleasant and cooperative, no obvious depression or anxiety, speech and thought processing grossly intact  ASSESSMENT AND PLAN:  Discussed the following assessment and plan:  Problem List Items Addressed This Visit     Aortic atherosclerosis ([place])    Continue crestor.        Hypercholesterolemia    Continue crestor.       Hypertension    Only on amlodipine and carvedilol currently.  Blood pressure trending down.  Is off lisinopril and aldactone.  Discussed probable need to add back medication in future.  Given blood pressures today and improvement,  will hold on additional medication.  Have them spot check her pressures.  Send in readings.  Follow metabolic panel       Hyponatremia    She had a recent admission to [facility] for further evaluation of NPH - external lumbar drainage  - lumbar spinal catheter. Did not qualify for shunt placement. S/p procedure she developed HA/Nausea.  these symptoms persisted and went to our ER - sodium 116.  Nephrology was called who recommended admission to SDU and starting on 3%  saline.  Then treated with lasix and fluid restriction. Also salt tablets.  sodium improved.  most of her blood pressure medications were held and she is currently only on amlodipine and carvedilol.  given her recent decline in her physical capability and some cognitive decline, she is experiencing some increased anxiety, stress and depression.  feels needs something to help calm her down. Discussed treatment options.  Feel may benefit from SSRI.  Will need close f/u of her sodium level.  Encourage increased po intake.  Follow.       Hypothyroidism    On thyroid replacement.  Follow tsh.       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 Did not qualify for shunt placement. Seeing neurology here.  Persistent dizziness.        Stress    Increased stress and anxiety as outlined.  Discussed treatment options.  Feel would benefit from SSRI.  Will need close monitoring of sodium.         Return if symptoms worsen or fail to improve, for keep scheduled.   I discussed the assessment and treatment plan with the patient. The patient was provided an opportunity to ask questions and all were answered. The patient agreed with the plan and demonstrated an understanding of the instructions.   The patient was advised to call back or seek an in-person evaluation if the symptoms worsen or if the condition fails to improve as anticipated.    Einar Pheasant, MD

## 2022-07-24 NOTE — Telephone Encounter (Signed)
Blood pressures are better.  Can schedule appt to discuss something for anxiety.  Can work in today at 9:30.

## 2022-07-26 ENCOUNTER — Ambulatory Visit: Payer: Medicare Other

## 2022-07-27 ENCOUNTER — Telehealth: Payer: Self-pay | Admitting: Internal Medicine

## 2022-07-27 ENCOUNTER — Other Ambulatory Visit: Payer: Self-pay

## 2022-07-27 ENCOUNTER — Encounter: Payer: Self-pay | Admitting: Internal Medicine

## 2022-07-27 ENCOUNTER — Ambulatory Visit (INDEPENDENT_AMBULATORY_CARE_PROVIDER_SITE_OTHER): Payer: Medicare Other | Admitting: Internal Medicine

## 2022-07-27 VITALS — BP 110/70 | HR 68 | Temp 97.8°F | Ht 62.0 in | Wt 125.8 lb

## 2022-07-27 DIAGNOSIS — F439 Reaction to severe stress, unspecified: Secondary | ICD-10-CM | POA: Insufficient documentation

## 2022-07-27 DIAGNOSIS — E871 Hypo-osmolality and hyponatremia: Secondary | ICD-10-CM

## 2022-07-27 DIAGNOSIS — N3000 Acute cystitis without hematuria: Secondary | ICD-10-CM | POA: Diagnosis not present

## 2022-07-27 DIAGNOSIS — E7800 Pure hypercholesterolemia, unspecified: Secondary | ICD-10-CM

## 2022-07-27 DIAGNOSIS — I10 Essential (primary) hypertension: Secondary | ICD-10-CM

## 2022-07-27 DIAGNOSIS — R300 Dysuria: Secondary | ICD-10-CM | POA: Diagnosis not present

## 2022-07-27 MED ORDER — CIPROFLOXACIN HCL 500 MG PO TABS: 500.0000 mg | ORAL_TABLET | Freq: Two times a day (BID) | ORAL | 0 refills | Status: AC

## 2022-07-27 MED ORDER — SERTRALINE HCL 25 MG PO TABS: 25.0000 mg | ORAL_TABLET | Freq: Every day | ORAL | 3 refills | Status: DC

## 2022-07-27 NOTE — Assessment & Plan Note (Signed)
Continue crestor 

## 2022-07-27 NOTE — Assessment & Plan Note (Signed)
Increased stress and anxiety as outlined.  Discussed treatment options.  Feel would benefit from SSRI.  Will need close monitoring of sodium.

## 2022-07-27 NOTE — Progress Notes (Signed)
No chief complaint on file.  F/u with husband 1. 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 Dizziness chronic f/u neurology Dr. Manuella Ghazi and cards Dr. Fletcher Anon  3 anxiety last note PCP states ssri but pt has h/o hyponatremia so this is concern     Review of Systems  Constitutional:  Negative for weight loss.  HENT:  Negative for hearing loss.   Eyes:  Negative for blurred vision.  Respiratory:  Negative for shortness of breath.   Cardiovascular:  Negative for chest pain.  Gastrointestinal:  Negative for abdominal pain and blood in stool.  Genitourinary:  Positive for dysuria.  Musculoskeletal:  Positive for back pain. Negative for falls and joint pain.  Skin:  Negative for rash.  Neurological:  Negative for headaches.  Psychiatric/Behavioral:  Negative for depression.    Past Medical History:  Diagnosis Date   Arthritis    Chicken pox    Endometriosis    Esophagitis    s/p esophageal dilatation   GERD (gastroesophageal reflux disease)    History of ovarian cyst    Hypercholesterolemia    Hypertension    Hypothyroidism    Past Surgical History:  Procedure Laterality Date   ABDOMINAL SURGERY     found to have an ovarian cyst and endometriosis   APPENDECTOMY     Family History  Problem Relation Age of Onset   Lung disease Father    Heart attack Father    Heart disease Mother        myocardial infarction   Breast cancer Mother 72   Heart attack Mother    Hypertension Mother    Hyperlipidemia Mother    Breast cancer Sister 20   Hypertension Sister    Rheum arthritis Sister    Breast cancer Maternal Aunt 6   Hypertension Brother    Colon cancer Neg Hx    Social History   Socioeconomic History   Marital status: Married    Spouse name: Not on file   Number of children: Not on file   Years of education: Not on file   Highest education level: Not on file  Occupational History   Not on file  Tobacco Use   Smoking status:  Never   Smokeless tobacco: Never  Vaping Use   Vaping Use: Never used  Substance and Sexual Activity   Alcohol use: No    Alcohol/week: 0.0 standard drinks of alcohol   Drug use: No   Sexual activity: Never  Other Topics Concern   Not on file  Social History Narrative   Not on file   Social Determinants of Health   Financial Resource Strain: Low Risk  (04/28/2022)   Overall Financial Resource Strain (CARDIA)    Difficulty of Paying Living Expenses: Not hard at all  Food Insecurity: No Food Insecurity (04/28/2022)   Hunger Vital Sign    Worried About Running Out of Food in the Last Year: Never true    Ran Out of Food in the Last Year: Never true  Transportation Needs: No Transportation Needs (04/28/2022)   PRAPARE - Hydrologist (Medical): No    Lack of Transportation (Non-Medical): No  Physical Activity: Insufficiently Active (04/28/2022)   Exercise Vital Sign    Days of Exercise per Week: 7 days    Minutes of Exercise per Session: 10 min  Stress: No Stress Concern Present (04/28/2022)   [place] -  Occupational Stress Questionnaire    Feeling of Stress : Not at all  Social Connections: Unknown (04/28/2022)   Social Connection and Isolation Panel [NHANES]    Frequency of Communication with Friends and Family: More than three times a week    Frequency of Social Gatherings with Friends and Family: More than three times a week    Attends Religious Services: More than 4 times per year    Active Member of [employer] or Organizations: Not on file    Attends Archivist Meetings: Not on file    Marital Status: Married  Intimate Partner Violence: Not At Risk (04/28/2022)   Humiliation, Afraid, Rape, and Kick questionnaire    Fear of Current or Ex-Partner: No    Emotionally Abused: No    Physically Abused: No    Sexually Abused: No   Current Meds  Medication Sig   acetaminophen (TYLENOL) 500 MG tablet Take 1 tablet (500 mg  total) by mouth every 6 (six) hours as needed for mild pain (or Fever >/= 101).   amLODipine (NORVASC) 5 MG tablet Take 1 tablet (5 mg total) by mouth daily.   aspirin 81 MG EC tablet Take 1 tablet (81 mg total) by mouth daily. Swallow whole.   Biotin 5 MG CAPS Take 5,000 mcg by mouth daily.   carvedilol (COREG) 6.25 MG tablet Take 1 tablet (6.25 mg total) by mouth 2 (two) times daily with a meal. (Patient taking differently: Take 12.5 mg by mouth 2 (two) times daily with a meal.)   dorzolamide-timolol (COSOPT) 22.3-6.8 MG/ML ophthalmic solution Place 1 drop into both eyes 2 (two) times daily.   guaiFENesin-dextromethorphan (ROBITUSSIN DM) 100-10 MG/5ML syrup Take 5 mLs by mouth every 4 (four) hours as needed for cough.   levothyroxine (SYNTHROID) 75 MCG tablet TAKE 1 TABLET BY MOUTH EVERY DAY   Multiple Vitamins-Minerals (ZINC PO) Take 1 tablet by mouth daily.   pantoprazole (PROTONIX) 40 MG tablet TAKE 1 TABLET BY MOUTH EVERY DAY (WAITING ON PA)   polyethylene glycol (MIRALAX / GLYCOLAX) 17 g packet Take 17 g by mouth daily.   rosuvastatin (CRESTOR) 10 MG tablet TAKE 1 TABLET BY MOUTH EVERY DAY   Allergies  Allergen Reactions   Meloxicam     BP issues   Recent Results (from the past 2160 hour(s))  Basic metabolic panel     Status: Abnormal   Collection Time: 06/07/22  7:50 PM  Result Value Ref Range   Sodium 116 (LL) 135 - 145 mmol/L    Comment: CRITICAL RESULT CALLED TO, READ BACK BY AND VERIFIED WITH RN LISA THOMPSON AT 2023 06/07/2022 GAA    Potassium 3.4 (L) 3.5 - 5.1 mmol/L   Chloride 85 (L) 98 - 111 mmol/L   CO2 21 (L) 22 - 32 mmol/L   Glucose, Bld 169 (H) 70 - 99 mg/dL    Comment: Glucose reference range applies only to samples taken after fasting for at least 8 hours.   BUN 14 8 - 23 mg/dL   Creatinine, Ser 0.61 0.44 - 1.00 mg/dL   Calcium 9.6 8.9 - 10.3 mg/dL   GFR, Estimated >60 >60 mL/min    Comment: (NOTE) Calculated using the CKD-EPI Creatinine Equation (2021)     Anion gap 10 5 - 15    Comment: Performed at [facility], [address] [place], [place] [address]  CBC     Status: None   Collection Time: 06/07/22  7:50 PM  Result Value Ref Range  WBC 9.3 4.0 - 10.5 K/uL   RBC 4.66 3.87 - 5.11 MIL/uL   Hemoglobin 14.4 12.0 - 15.0 g/dL   HCT 40.2 36.0 - 46.0 %   MCV 86.3 80.0 - 100.0 fL   MCH 30.9 26.0 - 34.0 pg   MCHC 35.8 30.0 - 36.0 g/dL   RDW 11.9 11.5 - 15.5 %   Platelets 301 150 - 400 K/uL   nRBC 0.0 0.0 - 0.2 %    Comment: Performed at [facility], [address] [place], [place] [address]  Osmolality     Status: Abnormal   Collection Time: 06/07/22  7:50 PM  Result Value Ref Range   Osmolality 249 (LL) 275 - 295 mOsm/kg    Comment: CALLED TO RN [employer] WALLACE 06/07/22 1112  Urinalysis, Routine w reflex microscopic     Status: Abnormal   Collection Time: 06/07/22 11:17 PM  Result Value Ref Range   Color, Urine STRAW (A) YELLOW   APPearance CLEAR (A) CLEAR   Specific Gravity, Urine 1.008 1.005 - 1.030   pH 7.0 5.0 - 8.0   Glucose, UA 50 (A) NEGATIVE mg/dL   Hgb urine dipstick MODERATE (A) NEGATIVE   Bilirubin Urine NEGATIVE NEGATIVE   Ketones, ur NEGATIVE NEGATIVE mg/dL   Protein, ur NEGATIVE NEGATIVE mg/dL   Nitrite NEGATIVE NEGATIVE   Leukocytes,Ua NEGATIVE NEGATIVE   RBC / HPF 11-20 0 - 5 RBC/hpf   WBC, UA 0-5 0 - 5 WBC/hpf   Bacteria, UA NONE SEEN NONE SEEN   Squamous Epithelial / LPF 0-5 0 - 5   Mucus PRESENT     Comment: Performed at [facility], [address] [place], [place] [address]  Sodium     Status: Abnormal   Collection Time: 06/07/22 11:17 PM  Result Value Ref Range   Sodium 121 (L) 135 - 145 mmol/L    Comment: Performed at [facility], [address] [place], [place] [address]  Osmolality, urine     Status: Abnormal   Collection Time: 06/07/22 11:17 PM  Result Value Ref Range   Osmolality, Ur 264 (L) 300 - 900 mOsm/kg    Comment: Performed at [facility], [address] [place], [place] [address]  Sodium, urine, random     Status: None   Collection Time: 06/07/22 11:17 PM  Result Value Ref Range   Sodium, Ur 47 mmol/L    Comment: Performed at [facility], [address] [place], [place] [address]  Sodium     Status: Abnormal   Collection Time: 06/08/22 12:06 AM  Result Value Ref Range   Sodium 124 (L) 135 - 145 mmol/L    Comment: RESULTS VERIFIED BY REPEAT TESTING GA Performed at [facility], [address] [place], [place] [address]   TSH     Status: None   Collection Time: 06/08/22  2:19 AM  Result Value Ref Range   TSH 2.572 0.350 - 4.500 uIU/mL    Comment: Performed by a 3rd Generation assay with a functional sensitivity of <=0.01 uIU/mL. Performed at [facility], [address] [place],  [address]   Basic metabolic panel     Status: Abnormal   Collection Time: 06/08/22  2:19 AM  Result Value Ref Range   Sodium 127 (L) 135 - 145 mmol/L    Comment: RESULTS VERIFIED BY REPEAT TESTING GA   Potassium 3.7 3.5 - 5.1 mmol/L   Chloride 99 98 - 111 mmol/L  CO2 20 (L) 22 - 32 mmol/L   Glucose, Bld 100 (H) 70 - 99 mg/dL    Comment: Glucose reference range applies only to samples taken after fasting for at least 8 hours.   BUN 11 8 - 23 mg/dL   Creatinine, Ser 0.59 0.44 - 1.00 mg/dL   Calcium 9.2 8.9 - 10.3 mg/dL   GFR, Estimated >60 >60 mL/min    Comment: (NOTE) Calculated using the CKD-EPI Creatinine Equation (2021)    Anion gap 8 5 - 15    Comment: Performed at [facility], [address] [place], [place] [address]  Basic metabolic panel     Status: Abnormal   Collection Time: 06/08/22  6:00 AM  Result Value Ref Range   Sodium 127 (L) 135 - 145 mmol/L   Potassium 3.3 (L) 3.5 - 5.1 mmol/L   Chloride 99 98 - 111 mmol/L   CO2 22 22 - 32 mmol/L   Glucose, Bld 104 (H) 70 - 99 mg/dL    Comment: Glucose reference range applies only to samples taken after fasting  for at least 8 hours.   BUN 11 8 - 23 mg/dL   Creatinine, Ser 0.57 0.44 - 1.00 mg/dL   Calcium 9.3 8.9 - 10.3 mg/dL   GFR, Estimated >60 >60 mL/min    Comment: (NOTE) Calculated using the CKD-EPI Creatinine Equation (2021)    Anion gap 6 5 - 15    Comment: Performed at [facility], [address] [place], [place] [address]  Magnesium     Status: None   Collection Time: 06/08/22  6:00 AM  Result Value Ref Range   Magnesium 1.9 1.7 - 2.4 mg/dL    Comment: Performed at [facility], [address] [place], [place] [address]  CBC     Status: None   Collection Time: 06/08/22 11:09 AM  Result Value Ref Range   WBC 8.3 4.0 - 10.5 K/uL   RBC 4.41 3.87 - 5.11 MIL/uL   Hemoglobin 13.7 12.0 - 15.0 g/dL   HCT 38.6 36.0 - 46.0 %   MCV 87.5 80.0 - 100.0 fL   MCH 31.1 26.0 - 34.0 pg   MCHC 35.5 30.0 - 36.0 g/dL   RDW 12.2 11.5 - 15.5 %   Platelets 243 150 - 400 K/uL   nRBC 0.0 0.0 - 0.2 %    Comment: Performed at [facility], [address] [place], [place] [address]  Comprehensive metabolic panel     Status: Abnormal   Collection Time: 06/08/22 11:09 AM  Result Value Ref Range   Sodium 127 (L) 135 - 145 mmol/L   Potassium 3.1 (L) 3.5 - 5.1 mmol/L   Chloride 97 (L) 98 - 111 mmol/L   CO2 22 22 - 32 mmol/L   Glucose, Bld 191 (H) 70 - 99 mg/dL    Comment: Glucose reference range applies only to samples taken after fasting for at least 8 hours.   BUN 13 8 - 23 mg/dL   Creatinine, Ser 0.68 0.44 - 1.00 mg/dL   Calcium 9.5 8.9 - 10.3 mg/dL   Total Protein 5.7 (L) 6.5 - 8.1 g/dL   Albumin 3.6 3.5 - 5.0 g/dL   AST 24 15 - 41 U/L   ALT 16 0 - 44 U/L   Alkaline Phosphatase 46 38 - 126 U/L   Total Bilirubin 3.2 (H) 0.3 - 1.2 mg/dL   GFR, Estimated >60 >60 mL/min    Comment: (NOTE) Calculated using  the CKD-EPI Creatinine Equation (2021)    Anion gap 8 5 - 15    Comment: Performed at [facility], [address] [place], [place] [address]  Sodium      Status: Abnormal   Collection Time: 06/08/22  4:03 PM  Result Value Ref Range   Sodium 126 (L) 135 - 145 mmol/L    Comment: Performed at [facility], [address] [place], [place] [address]  Sodium     Status: Abnormal   Collection Time: 06/08/22  8:29 PM  Result Value Ref Range   Sodium 120 (L) 135 - 145 mmol/L    Comment: CRITICAL RESULT CALLED TO, READ BACK BY AND VERIFIED WITH ROSE NGU 06/08/22 2103 MU Performed at [facility], [address] [place], [place] [address]   Sodium     Status: Abnormal   Collection Time: 06/08/22 11:48 PM  Result Value Ref Range   Sodium 118 (LL) 135 - 145 mmol/L    Comment: RESULTS VERIFIED BY REPEAT TESTING GA CRITICAL RESULT CALLED TO, READ BACK BY AND VERIFIED WITH RN NGU [id number] [id number] 06/09/2022 GA Performed at [facility], [address] [place], [place] [address]   Sodium     Status: Abnormal   Collection Time: 06/09/22  3:03 AM  Result Value Ref Range   Sodium 119 (LL) 135 - 145 mmol/L    Comment: CRITICAL RESULT CALLED TO, READ BACK BY AND VERIFIED WITH ROSE NGU'@0326'$  06/09/22 RH Performed at [facility], [address] [place], [place] [address]   Sodium     Status: Abnormal   Collection Time: 06/09/22  4:43 AM  Result Value Ref Range   Sodium 119 (LL) 135 - 145 mmol/L    Comment: CRITICAL RESULT CALLED TO, READ BACK BY AND VERIFIED WITH ROSE NGU'@0610'$  06/09/22 RH Performed at [facility], [address] [place], [place] [address]   Sodium     Status: Abnormal   Collection Time: 06/09/22  6:36 AM  Result Value Ref Range   Sodium 121 (L) 135 - 145 mmol/L    Comment: Performed at [facility], [address] [place], [place] [address]  Basic metabolic panel     Status: Abnormal   Collection Time: 06/09/22  8:29 AM  Result Value Ref Range   Sodium 121 (L) 135 - 145 mmol/L   Potassium 3.6 3.5 - 5.1 mmol/L   Chloride 91 (L) 98 - 111 mmol/L   CO2 22 22 - 32  mmol/L   Glucose, Bld 93 70 - 99 mg/dL    Comment: Glucose reference range applies only to samples taken after fasting for at least 8 hours.   BUN 10 8 - 23 mg/dL   Creatinine, Ser 0.55 0.44 - 1.00 mg/dL   Calcium 9.3 8.9 - 10.3 mg/dL   GFR, Estimated >60 >60 mL/min    Comment: (NOTE) Calculated using the CKD-EPI Creatinine Equation (2021)    Anion gap 8 5 - 15    Comment: Performed at [facility], [address] [place], [place] [address]  Sodium     Status: Abnormal   Collection Time: 06/09/22 10:39 AM  Result Value Ref Range   Sodium 121 (L) 135 - 145 mmol/L    Comment: Performed at [facility], [address] [place], [place] [address]  Sodium     Status: Abnormal   Collection Time: 06/09/22  1:11 PM  Result Value Ref Range   Sodium 123 (  L) 135 - 145 mmol/L    Comment: Performed at [facility], [address] [place], [place] [address]  Sodium     Status: Abnormal   Collection Time: 06/09/22  3:59 PM  Result Value Ref Range   Sodium 122 (L) 135 - 145 mmol/L    Comment: Performed at [facility], [address] [place], [place] [address]  Sodium     Status: Abnormal   Collection Time: 06/09/22  8:04 PM  Result Value Ref Range   Sodium 123 (L) 135 - 145 mmol/L    Comment: Performed at [facility], [address], [place], [place] [address]  Sodium     Status: Abnormal   Collection Time: 06/10/22 12:52 AM  Result Value Ref Range   Sodium 125 (L) 135 - 145 mmol/L    Comment: Performed at [facility] [place], [address] [place], [place] [address]  Sodium     Status: Abnormal   Collection Time: 06/10/22  4:57 AM  Result Value Ref Range   Sodium 126 (L) 135 - 145 mmol/L    Comment: Performed at [facility], [address] [place], [place] [address]  Sodium     Status: Abnormal   Collection Time: 06/10/22  9:21 AM  Result Value Ref Range   Sodium 127 (L) 135 - 145 mmol/L    Comment: Performed at  [facility], [address] [place], [place] [address]  Sodium     Status: Abnormal   Collection Time: 06/10/22 12:04 PM  Result Value Ref Range   Sodium 127 (L) 135 - 145 mmol/L    Comment: Performed at [facility], [address] [place], [place] [address]  Potassium     Status: Abnormal   Collection Time: 06/10/22 12:04 PM  Result Value Ref Range   Potassium 3.2 (L) 3.5 - 5.1 mmol/L    Comment: Performed at [facility], [address] [place], [place] [address]  Creatinine, serum     Status: None   Collection Time: 06/10/22 12:04 PM  Result Value Ref Range   Creatinine, Ser 0.72 0.44 - 1.00 mg/dL   GFR, Estimated >60 >60 mL/min    Comment: (NOTE) Calculated using the CKD-EPI Creatinine Equation (2021) Performed at [facility], [address] [place], [place] [address]   Osmolality, urine     Status: Abnormal   Collection Time: 06/10/22  1:19 PM  Result Value Ref Range   Osmolality, Ur 168 (L) 300 - 900 mOsm/kg    Comment: RESULT REPEATED AND VERIFIED Performed at [facility], [address] [place], [place] [address]   Sodium     Status: Abnormal   Collection Time: 06/10/22  4:04 PM  Result Value Ref Range   Sodium 128 (L) 135 - 145 mmol/L    Comment: Performed at [facility], [address] [place], [place] [address]  Sodium     Status: Abnormal   Collection Time: 06/10/22  7:43 PM  Result Value Ref Range   Sodium 128 (L) 135 - 145 mmol/L    Comment: Performed at [facility] [address] [place], [place] [address]  Sodium     Status: Abnormal   Collection Time: 06/11/22 12:08 AM  Result Value Ref Range   Sodium 129 (L) 135 - 145 mmol/L    Comment: Performed at [facility], [address] [place], [place] [address]  Renal function panel  Status: Abnormal   Collection Time: 06/11/22  4:26 AM  Result Value Ref Range   Sodium 131 (L) 135 - 145 mmol/L   Potassium 3.6 3.5  - 5.1 mmol/L   Chloride 98 98 - 111 mmol/L   CO2 25 22 - 32 mmol/L   Glucose, Bld 120 (H) 70 - 99 mg/dL    Comment: Glucose reference range applies only to samples taken after fasting for at least 8 hours.   BUN 14 8 - 23 mg/dL   Creatinine, Ser 0.72 0.44 - 1.00 mg/dL   Calcium 9.6 8.9 - 10.3 mg/dL   Phosphorus 2.1 (L) 2.5 - 4.6 mg/dL   Albumin 3.7 3.5 - 5.0 g/dL   GFR, Estimated >60 >60 mL/min    Comment: (NOTE) Calculated using the CKD-EPI Creatinine Equation (2021)    Anion gap 8 5 - 15    Comment: Performed at [facility], [address] [place], [place] [address]  Urinalysis, Complete w Microscopic     Status: Abnormal   Collection Time: 06/11/22  7:56 AM  Result Value Ref Range   Color, Urine YELLOW (A) YELLOW   APPearance CLEAR (A) CLEAR   Specific Gravity, Urine 1.012 1.005 - 1.030   pH 6.0 5.0 - 8.0   Glucose, UA NEGATIVE NEGATIVE mg/dL   Hgb urine dipstick MODERATE (A) NEGATIVE   Bilirubin Urine NEGATIVE NEGATIVE   Ketones, ur NEGATIVE NEGATIVE mg/dL   Protein, ur NEGATIVE NEGATIVE mg/dL   Nitrite NEGATIVE NEGATIVE   Leukocytes,Ua NEGATIVE NEGATIVE   RBC / HPF 0-5 0 - 5 RBC/hpf   WBC, UA 0-5 0 - 5 WBC/hpf   Bacteria, UA RARE (A) NONE SEEN   Squamous Epithelial / LPF 0-5 0 - 5    Comment: Performed at [facility], [address] [place], [place] [address]  Sodium     Status: Abnormal   Collection Time: 06/11/22  8:38 AM  Result Value Ref Range   Sodium 130 (L) 135 - 145 mmol/L    Comment: Performed at [facility], [address] [place], [place] [address]  Phosphorus     Status: None   Collection Time: 06/12/22  5:17 AM  Result Value Ref Range   Phosphorus 3.4 2.5 - 4.6 mg/dL    Comment: Performed at [facility], [address] [place], [place] [address]  Sodium     Status: Abnormal   Collection Time: 06/12/22  5:17 AM  Result Value Ref Range   Sodium 132 (L) 135 - 145 mmol/L    Comment: Performed at [facility], [address], [place], [place] [address]  Potassium     Status: None   Collection Time: 06/12/22  5:17 AM  Result Value Ref Range   Potassium 3.6 3.5 - 5.1 mmol/L    Comment: Performed at [facility], [address] [place], [place] [address]  Hepatic function panel     Status: Abnormal   Collection Time: 07/04/22 12:06 PM  Result Value Ref Range   Total Bilirubin 1.6 (H) 0.2 - 1.2 mg/dL   Bilirubin, Direct 0.3 0.0 - 0.3 mg/dL   Alkaline Phosphatase 58 39 - 117 U/L   AST 15 0 - 37 U/L   ALT 18 0 - 35 U/L   Total Protein 6.3 6.0 - 8.3 g/dL   Albumin 4.1 3.5 - 5.2 g/dL  Basic metabolic panel     Status: None   Collection Time: 07/04/22 12:06 PM  Result  Value Ref Range   Sodium 137 135 - 145 mEq/L   Potassium 3.8 3.5 - 5.1 mEq/L   Chloride 101 96 - 112 mEq/L   CO2 25 19 - 32 mEq/L   Glucose, Bld 88 70 - 99 mg/dL   BUN 16 6 - 23 mg/dL   Creatinine, Ser 0.70 0.40 - 1.20 mg/dL   GFR 80.46 >60.00 mL/min    Comment: Calculated using the CKD-EPI Creatinine Equation (2021)   Calcium 10.1 8.4 - 10.5 mg/dL   Objective  Body mass index is 23.01 kg/m. Wt Readings from Last 3 Encounters:  07/27/22 125 lb 12.8 oz (57.1 kg)  07/24/22 123 lb (55.8 kg)  07/04/22 123 lb 3.2 oz (55.9 kg)   Temp Readings from Last 3 Encounters:  07/27/22 97.8 F (36.6 C) (Oral)  07/24/22 98.2 F (36.8 C)  07/04/22 (!) 97.4 F (36.3 C) (Oral)   BP Readings from Last 3 Encounters:  07/27/22 110/70  07/24/22 (!) 145/60  07/04/22 120/70   Pulse Readings from Last 3 Encounters:  07/27/22 68  07/24/22 64  07/04/22 80    Physical Exam Vitals and nursing note reviewed.  Constitutional:      Appearance: Normal appearance. She is well-developed and well-groomed.  HENT:     Head: Normocephalic and atraumatic.  Eyes:     Conjunctiva/sclera: Conjunctivae normal.     Pupils: Pupils are equal, round, and reactive to light.  Cardiovascular:     Rate and Rhythm: Normal rate  and regular rhythm.     Heart sounds: Normal heart sounds. No murmur heard. Pulmonary:     Effort: Pulmonary effort is normal.     Breath sounds: Normal breath sounds.  Abdominal:     General: Abdomen is flat. Bowel sounds are normal.     Tenderness: There is no abdominal tenderness.  Musculoskeletal:        General: No tenderness.  Skin:    General: Skin is warm and dry.  Neurological:     General: No focal deficit present.     Mental Status: She is alert and oriented to person, place, and time. Mental status is at baseline.     Cranial Nerves: Cranial nerves 2-12 are intact.     Gait: Gait is intact.     Comments: Walks with walker  Psychiatric:        Attention and Perception: Attention and perception normal.        Mood and Affect: Mood and affect normal.        Speech: Speech normal.        Behavior: Behavior normal. Behavior is cooperative.        Thought Content: Thought content normal.        Cognition and Memory: Cognition and memory normal.        Judgment: Judgment normal.     Assessment  Plan  Acute cystitis without hematuria - Plan: Urinalysis, Routine w reflex microscopic, Urine Culture  Dysuria   Pharmacy is total care   Provider: Dr. Olivia Mackie McLean-Scocuzza-Internal Medicine

## 2022-07-27 NOTE — Progress Notes (Signed)
I have sent a phone message about starting zoloft.  I was waiting to hear from nephrology.  There is a phone message - they are supposed to call her and start her on zoloft '25mg'$  q day.

## 2022-07-27 NOTE — Patient Instructions (Addendum)
Azo for UTI if needed burning  Urinary Tract Infection, Adult  A urinary tract infection (UTI) is an infection of any part of the urinary tract. The urinary tract includes the kidneys, ureters, bladder, and urethra. These organs make, store, and get rid of urine in the body. An upper UTI affects the ureters and kidneys. A lower UTI affects the bladder and urethra. What are the causes? Most urinary tract infections are caused by bacteria in your genital area around your urethra, where urine leaves your body. These bacteria grow and cause inflammation of your urinary tract. What increases the risk? You are more likely to develop this condition if: You have a urinary catheter that stays in place. You are not able to control when you urinate or have a bowel movement (incontinence). You are female and you: Use a spermicide or diaphragm for birth control. Have low estrogen levels. Are pregnant. You have certain genes that increase your risk. You are sexually active. You take antibiotic medicines. You have a condition that causes your flow of urine to slow down, such as: An enlarged prostate, if you are female. Blockage in your urethra. A kidney stone. A nerve condition that affects your bladder control (neurogenic bladder). Not getting enough to drink, or not urinating often. You have certain medical conditions, such as: Diabetes. A weak disease-fighting system (immunesystem). Sickle cell disease. Gout. Spinal cord injury. What are the signs or symptoms? Symptoms of this condition include: Needing to urinate right away (urgency). Frequent urination. This may include small amounts of urine each time you urinate. Pain or burning with urination. Blood in the urine. Urine that smells bad or unusual. Trouble urinating. Cloudy urine. Vaginal discharge, if you are female. Pain in the abdomen or the lower back. You may also have: Vomiting or a decreased appetite. Confusion. Irritability  or tiredness. A fever or chills. Diarrhea. The first symptom in older adults may be confusion. In some cases, they may not have any symptoms until the infection has worsened. How is this diagnosed? This condition is diagnosed based on your medical history and a physical exam. You may also have other tests, including: Urine tests. Blood tests. Tests for STIs (sexually transmitted infections). If you have had more than one UTI, a cystoscopy or imaging studies may be done to determine the cause of the infections. How is this treated? Treatment for this condition includes: Antibiotic medicine. Over-the-counter medicines to treat discomfort. Drinking enough water to stay hydrated. If you have frequent infections or have other conditions such as a kidney stone, you may need to see a health care provider who specializes in the urinary tract (urologist). In rare cases, urinary tract infections can cause sepsis. Sepsis is a life-threatening condition that occurs when the body responds to an infection. Sepsis is treated in the hospital with IV antibiotics, fluids, and other medicines. Follow these instructions at home:  Medicines Take over-the-counter and prescription medicines only as told by your health care provider. If you were prescribed an antibiotic medicine, take it as told by your health care provider. Do not stop using the antibiotic even if you start to feel better. General instructions Make sure you: Empty your bladder often and completely. Do not hold urine for long periods of time. Empty your bladder after sex. Wipe from front to back after urinating or having a bowel movement if you are female. Use each tissue only one time when you wipe. Drink enough fluid to keep your urine pale yellow. Keep all follow-up  visits. This is important. Contact a health care provider if: Your symptoms do not get better after 1-2 days. Your symptoms go away and then return. Get help right away  if: You have severe pain in your back or your lower abdomen. You have a fever or chills. You have nausea or vomiting. Summary A urinary tract infection (UTI) is an infection of any part of the urinary tract, which includes the kidneys, ureters, bladder, and urethra. Most urinary tract infections are caused by bacteria in your genital area. Treatment for this condition often includes antibiotic medicines. If you were prescribed an antibiotic medicine, take it as told by your health care provider. Do not stop using the antibiotic even if you start to feel better. Keep all follow-up visits. This is important. This information is not intended to replace advice given to you by your health care provider. Make sure you discuss any questions you have with your health care provider. Document Revised: 05/21/2020 Document Reviewed: 05/21/2020 Elsevier Patient Education  [employer].

## 2022-07-27 NOTE — Telephone Encounter (Signed)
I had a virtual visit with her recently.  Ahd discussed need for medication to help control anxiety/stress.  Notify her that I would like to start her on a low dose of zoloft '25mg'$  q day.  Will need close monitoring of her sodium level. Schedule fasting labs in 2 weeks.  Will check cholesterol then as well.  Orders placed for labs.

## 2022-07-27 NOTE — Telephone Encounter (Signed)
I spoke with patient & she was agreeable to starting the Zoloft 25 mg. I have sent to patient's pharmacy. She is also scheduled fir fasting labs in two weeks.

## 2022-07-27 NOTE — Assessment & Plan Note (Signed)
On thyroid replacement.  Follow tsh.  

## 2022-07-27 NOTE — Assessment & Plan Note (Signed)
Only on amlodipine and carvedilol currently.  Blood pressure trending down.  Is off lisinopril and aldactone.  Discussed probable need to add back medication in future.  Given blood pressures today and improvement,  will hold on additional medication.  Have them spot check her pressures.  Send in readings.  Follow metabolic panel

## 2022-07-27 NOTE — Assessment & Plan Note (Signed)
She had a recent admission to [facility] for further evaluation of NPH - external lumbar drainage  - lumbar spinal catheter. Did not qualify for shunt placement. S/p procedure she developed HA/Nausea.  these symptoms persisted and went to our ER - sodium 116.  Nephrology was called who recommended admission to [facility] and starting on 3% saline.  Then treated with lasix and fluid restriction. Also salt tablets.  sodium improved.  most of her blood pressure medications were held and she is currently only on amlodipine and carvedilol.  given her recent decline in her physical capability and some cognitive decline, she is experiencing some increased anxiety, stress and depression.  feels needs something to help calm her down. Discussed treatment options.  Feel may benefit from SSRI.  Will need close f/u of her sodium level.  Encourage increased po intake.  Follow.

## 2022-07-27 NOTE — Addendum Note (Signed)
Addended by: Orland Mustard on: 07/27/2022 12:24 PM   Modules accepted: Orders

## 2022-07-27 NOTE — Progress Notes (Signed)
Crystal Lynch just sent me a message that she has sent in.  Thanks

## 2022-07-27 NOTE — Telephone Encounter (Signed)
Inform pt antibiotic [facility] cipro 500 mg bid x 3-5 days with food and zoloft resent to [facility] per Dr. Nicki Reaper ok to try  Also if needed med for burning azo for UTI over the counter but beware will turn urine orange color

## 2022-07-27 NOTE — Telephone Encounter (Signed)
Pt called and notified that Cipro as well as sertraline sent to [facility] for her.

## 2022-07-27 NOTE — Assessment & Plan Note (Signed)
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 Did not qualify for shunt placement. Seeing neurology here.  Persistent dizziness.   

## 2022-07-30 LAB — URINALYSIS, ROUTINE W REFLEX MICROSCOPIC
Bilirubin Urine: NEGATIVE
Glucose, UA: NEGATIVE
Ketones, ur: NEGATIVE
Nitrite: NEGATIVE
Specific Gravity, Urine: 1.014 (ref 1.001–1.035)
WBC, UA: >=60 /HPF — AB (ref 0–5)
pH: 6 (ref 5.0–8.0)

## 2022-07-30 LAB — URINE CULTURE
MICRO NUMBER:: 14012951
SPECIMEN QUALITY:: ADEQUATE

## 2022-07-30 LAB — MICROSCOPIC MESSAGE

## 2022-07-31 ENCOUNTER — Ambulatory Visit: Payer: Medicare Other

## 2022-08-01 ENCOUNTER — Telehealth: Payer: Self-pay | Admitting: Internal Medicine

## 2022-08-01 NOTE — Telephone Encounter (Signed)
Cathy from [facility] called wanting to report a fall that the pt had on this morning. No injuries or pain and the pt is fine

## 2022-08-02 ENCOUNTER — Ambulatory Visit: Payer: Medicare Other

## 2022-08-02 NOTE — Telephone Encounter (Signed)
Patient was eased to floor on carpet, reports no soreness or injury. Patient denied hitting head just really " sat on her bottom"

## 2022-08-02 NOTE — Telephone Encounter (Signed)
Please call and confirm pt is doing ok.  Confirm no injuries and no residual problems.

## 2022-08-07 ENCOUNTER — Other Ambulatory Visit: Payer: Self-pay

## 2022-08-07 ENCOUNTER — Telehealth: Payer: Self-pay | Admitting: Internal Medicine

## 2022-08-07 ENCOUNTER — Ambulatory Visit: Payer: Medicare Other

## 2022-08-07 MED ORDER — AMLODIPINE BESYLATE 5 MG PO TABS: 5.0000 mg | ORAL_TABLET | Freq: Every day | ORAL | 3 refills | Status: DC

## 2022-08-07 NOTE — Telephone Encounter (Signed)
Pt need refill on amLODipine sent to [facility]

## 2022-08-07 NOTE — Telephone Encounter (Signed)
Medication has been refilled and sent to pts requested pharmacy.

## 2022-08-09 ENCOUNTER — Ambulatory Visit: Payer: Medicare Other

## 2022-08-10 ENCOUNTER — Encounter: Payer: Self-pay | Admitting: Internal Medicine

## 2022-08-10 DIAGNOSIS — D219 Benign neoplasm of connective and other soft tissue, unspecified: Secondary | ICD-10-CM | POA: Insufficient documentation

## 2022-08-11 ENCOUNTER — Other Ambulatory Visit (INDEPENDENT_AMBULATORY_CARE_PROVIDER_SITE_OTHER): Payer: Medicare Other

## 2022-08-11 DIAGNOSIS — E7800 Pure hypercholesterolemia, unspecified: Secondary | ICD-10-CM | POA: Diagnosis not present

## 2022-08-11 DIAGNOSIS — I10 Essential (primary) hypertension: Secondary | ICD-10-CM | POA: Diagnosis not present

## 2022-08-11 DIAGNOSIS — E871 Hypo-osmolality and hyponatremia: Secondary | ICD-10-CM

## 2022-08-11 LAB — BASIC METABOLIC PANEL
BUN: 18 mg/dL (ref 6–23)
CO2: 28 mEq/L (ref 19–32)
Calcium: 10.4 mg/dL (ref 8.4–10.5)
Chloride: 100 mEq/L (ref 96–112)
Creatinine, Ser: 0.81 mg/dL (ref 0.40–1.20)
GFR: 67.48 mL/min (ref >60.00)
Glucose, Bld: 98 mg/dL (ref 70–99)
Potassium: 3.6 mEq/L (ref 3.5–5.1)
Sodium: 137 mEq/L (ref 135–145)

## 2022-08-11 LAB — HEPATIC FUNCTION PANEL
ALT: 22 U/L (ref 0–35)
AST: 16 U/L (ref 0–37)
Albumin: 4.5 g/dL (ref 3.5–5.2)
Alkaline Phosphatase: 55 U/L (ref 39–117)
Bilirubin, Direct: 0.2 mg/dL (ref 0.0–0.3)
Total Bilirubin: 1.4 mg/dL — ABNORMAL HIGH (ref 0.2–1.2)
Total Protein: 6.5 g/dL (ref 6.0–8.3)

## 2022-08-11 LAB — LIPID PANEL
Cholesterol: 181 mg/dL (ref 0–200)
HDL: 70.6 mg/dL (ref >39.00)
LDL Cholesterol: 87 mg/dL (ref 0–99)
NonHDL: 110.75
Total CHOL/HDL Ratio: 3
Triglycerides: 120 mg/dL (ref 0.0–149.0)
VLDL: 24 mg/dL (ref 0.0–40.0)

## 2022-08-14 ENCOUNTER — Ambulatory Visit: Payer: Medicare Other

## 2022-08-16 ENCOUNTER — Ambulatory Visit: Payer: Medicare Other

## 2022-08-21 ENCOUNTER — Ambulatory Visit: Payer: Medicare Other

## 2022-08-21 ENCOUNTER — Telehealth: Payer: Self-pay | Admitting: Internal Medicine

## 2022-08-21 ENCOUNTER — Other Ambulatory Visit: Payer: Self-pay

## 2022-08-21 MED ORDER — SERTRALINE HCL 25 MG PO TABS: 25.0000 mg | ORAL_TABLET | Freq: Every day | ORAL | 3 refills | Status: DC

## 2022-08-21 MED ORDER — ROSUVASTATIN CALCIUM 10 MG PO TABS: 10.0000 mg | ORAL_TABLET | Freq: Every day | ORAL | 1 refills | Status: DC

## 2022-08-21 NOTE — Telephone Encounter (Signed)
Pt need a refill on sertraline and rosuvastatin sent to [facility]

## 2022-08-23 ENCOUNTER — Ambulatory Visit: Payer: Medicare Other

## 2022-08-28 ENCOUNTER — Ambulatory Visit: Payer: Medicare Other

## 2022-08-28 NOTE — Telephone Encounter (Signed)
Medication was DC by [facility] PCP restarted on 07/12/22 do you wish to continue  this medication or have cardiology fill? Order pended, see phone note dated 9/202/23

## 2022-08-29 ENCOUNTER — Encounter: Payer: Self-pay | Admitting: Internal Medicine

## 2022-08-29 ENCOUNTER — Ambulatory Visit (INDEPENDENT_AMBULATORY_CARE_PROVIDER_SITE_OTHER): Payer: Medicare Other | Admitting: Internal Medicine

## 2022-08-29 VITALS — BP 136/70 | HR 73 | Temp 97.6°F | Resp 16 | Ht 62.0 in | Wt 125.6 lb

## 2022-08-29 DIAGNOSIS — K209 Esophagitis, unspecified without bleeding: Secondary | ICD-10-CM

## 2022-08-29 DIAGNOSIS — D219 Benign neoplasm of connective and other soft tissue, unspecified: Secondary | ICD-10-CM

## 2022-08-29 DIAGNOSIS — R42 Dizziness and giddiness: Secondary | ICD-10-CM

## 2022-08-29 DIAGNOSIS — E871 Hypo-osmolality and hyponatremia: Secondary | ICD-10-CM

## 2022-08-29 DIAGNOSIS — G912 (Idiopathic) normal pressure hydrocephalus: Secondary | ICD-10-CM

## 2022-08-29 DIAGNOSIS — I700 Atherosclerosis of aorta: Secondary | ICD-10-CM

## 2022-08-29 DIAGNOSIS — R2681 Unsteadiness on feet: Secondary | ICD-10-CM

## 2022-08-29 DIAGNOSIS — E039 Hypothyroidism, unspecified: Secondary | ICD-10-CM

## 2022-08-29 DIAGNOSIS — R413 Other amnesia: Secondary | ICD-10-CM

## 2022-08-29 DIAGNOSIS — N289 Disorder of kidney and ureter, unspecified: Secondary | ICD-10-CM

## 2022-08-29 DIAGNOSIS — I10 Essential (primary) hypertension: Secondary | ICD-10-CM

## 2022-08-29 DIAGNOSIS — E7800 Pure hypercholesterolemia, unspecified: Secondary | ICD-10-CM

## 2022-08-29 DIAGNOSIS — F439 Reaction to severe stress, unspecified: Secondary | ICD-10-CM

## 2022-08-29 MED ORDER — CARVEDILOL 12.5 MG PO TABS: 12.5000 mg | ORAL_TABLET | Freq: Two times a day (BID) | ORAL | 3 refills | Status: DC

## 2022-08-29 MED ORDER — LISINOPRIL 10 MG PO TABS: 10.0000 mg | ORAL_TABLET | Freq: Every day | ORAL | 1 refills | Status: DC

## 2022-08-29 MED ORDER — LISINOPRIL 10 MG PO TABS
10.0000 mg | ORAL_TABLET | Freq: Every day | ORAL | 3 refills | Status: DC
Start: 2022-08-29 — End: 2022-09-13

## 2022-08-29 NOTE — Telephone Encounter (Signed)
Need to clarify what she is taking.  According to 07/25/22 phone note, she was to increase to 12.'5mg'$  bid carvedilol.  (Per note, was going to take 2 (6.'25mg'$  tablets) - bid.  It appears medication list was not changed. Need to clarify what she has been taking and ok to refill.  Please correct med list if on 12.'5mg'$  tablet bid.

## 2022-08-29 NOTE — Progress Notes (Unsigned)
Patient ID: Crystal Lynch, female   DOB: May 15, 1941, 81 y.o.   MRN: [id number]   Subjective:    Patient ID: Crystal Lynch, female    DOB: 04-30-1941, 81 y.o.   MRN: [id number]    Patient here for  Chief Complaint  Patient presents with   Follow-up   .   HPI Here to follow up regarding increased stress/anxiety, memory issues, dizziness. and her blood pressure.  She is accompanied by her husband.  History obtained from both of them.  Was started on zoloft last visit.  Seeing Dr Lanora Manis - f/u low sodium. Also f/u kidney mass - plans to f/u beginning of next year - f/u ultrasound. Seeing neurology - f/u dizziness and memory change.  Recent PET - No specific pattern of hypometabolism to suggest a specific etiology of dementia. Ventriculomegaly. Concern regarding persistent light headedness and unsteady gait.  PT has not helped. Dizziness, memory and balance - persistent issues.  No chest pain.  Breathing stable.  No increased cough or congestion.  No abdominal pain.  Eating.    Past Medical History:  Diagnosis Date   Arthritis    Chicken pox    Endometriosis    Esophagitis    s/p esophageal dilatation   GERD (gastroesophageal reflux disease)    History of ovarian cyst    Hypercholesterolemia    Hypertension    Hypothyroidism    Past Surgical History:  Procedure Laterality Date   ABDOMINAL SURGERY     found to have an ovarian cyst and endometriosis   APPENDECTOMY     Family History  Problem Relation Age of Onset   Lung disease Father    Heart attack Father    Heart disease Mother        myocardial infarction   Breast cancer Mother 52   Heart attack Mother    Hypertension Mother    Hyperlipidemia Mother    Breast cancer Sister 71   Hypertension Sister    Rheum arthritis Sister    Breast cancer Maternal Aunt 49   Hypertension Brother    Colon cancer Neg Hx    Social History   Socioeconomic History   Marital status: Married    Spouse name: Not on file   Number of  children: Not on file   Years of education: Not on file   Highest education level: Not on file  Occupational History   Not on file  Tobacco Use   Smoking status: Never   Smokeless tobacco: Never  Vaping Use   Vaping Use: Never used  Substance and Sexual Activity   Alcohol use: No    Alcohol/week: 0.0 standard drinks of alcohol   Drug use: No   Sexual activity: Never  Other Topics Concern   Not on file  Social History Narrative   Not on file   Social Determinants of Health   Financial Resource Strain: Low Risk  (04/28/2022)   Overall Financial Resource Strain (CARDIA)    Difficulty of Paying Living Expenses: Not hard at all  Food Insecurity: No Food Insecurity (04/28/2022)   Hunger Vital Sign    Worried About Running Out of Food in the Last Year: Never true    Ran Out of Food in the Last Year: Never true  Transportation Needs: No Transportation Needs (04/28/2022)   PRAPARE - Hydrologist (Medical): No    Lack of Transportation (Non-Medical): No  Physical Activity: Insufficiently Active (04/28/2022)   Exercise Vital Sign  Days of Exercise per Week: 7 days    Minutes of Exercise per Session: 10 min  Stress: No Stress Concern Present (04/28/2022)   [place]    Feeling of Stress : Not at all  Social Connections: Unknown (04/28/2022)   Social Connection and Isolation Panel [NHANES]    Frequency of Communication with Friends and Family: More than three times a week    Frequency of Social Gatherings with Friends and Family: More than three times a week    Attends Religious Services: More than 4 times per year    Active Member of [employer] or Organizations: Not on file    Attends Archivist Meetings: Not on file    Marital Status: Married     Review of Systems  Constitutional:  Negative for appetite change and unexpected weight change.  HENT:  Negative for congestion and sinus  pressure.   Respiratory:  Negative for cough, chest tightness and shortness of breath.   Cardiovascular:  Negative for chest pain, palpitations and leg swelling.  Gastrointestinal:  Negative for abdominal pain, diarrhea, nausea and vomiting.  Genitourinary:  Negative for difficulty urinating and dysuria.  Musculoskeletal:  Negative for joint swelling and myalgias.  Skin:  Negative for color change and rash.  Neurological:  Positive for dizziness and light-headedness.  Psychiatric/Behavioral:  Negative for agitation.        Increased stress related to current medical issues.        Objective:     BP 136/70 (BP Location: Left Arm, Patient Position: Sitting, Cuff Size: Small)   Pulse 73   Temp 97.6 F (36.4 C) (Temporal)   Resp 16   Ht '5\' 2"'$  (1.575 m)   Wt 125 lb 9.6 oz (57 kg)   LMP 10/21/1984   SpO2 94%   BMI 22.97 kg/m  Wt Readings from Last 3 Encounters:  08/29/22 125 lb 9.6 oz (57 kg)  07/27/22 125 lb 12.8 oz (57.1 kg)  07/24/22 123 lb (55.8 kg)    Physical Exam Vitals reviewed.  Constitutional:      General: She is not in acute distress.    Appearance: Normal appearance.  HENT:     Head: Normocephalic and atraumatic.     Right Ear: External ear normal.     Left Ear: External ear normal.  Eyes:     General: No scleral icterus.       Right eye: No discharge.        Left eye: No discharge.     Conjunctiva/sclera: Conjunctivae normal.  Neck:     Thyroid: No thyromegaly.  Cardiovascular:     Rate and Rhythm: Normal rate and regular rhythm.  Pulmonary:     Effort: No respiratory distress.     Breath sounds: Normal breath sounds. No wheezing.  Abdominal:     General: Bowel sounds are normal.     Palpations: Abdomen is soft.     Tenderness: There is no abdominal tenderness.  Musculoskeletal:        General: No swelling or tenderness.     Cervical back: Neck supple. No tenderness.  Lymphadenopathy:     Cervical: No cervical adenopathy.  Skin:    Findings: No  erythema or rash.  Neurological:     Mental Status: She is alert.  Psychiatric:        Mood and Affect: Mood normal.        Behavior: Behavior normal.  Outpatient Encounter Medications as of 08/29/2022  Medication Sig   acetaminophen (TYLENOL) 500 MG tablet Take 1 tablet (500 mg total) by mouth every 6 (six) hours as needed for mild pain (or Fever >/= 101).   amLODipine (NORVASC) 5 MG tablet Take 1 tablet (5 mg total) by mouth daily.   aspirin 81 MG EC tablet Take 1 tablet (81 mg total) by mouth daily. Swallow whole.   Biotin 5 MG CAPS Take 5,000 mcg by mouth daily.   carvedilol (COREG) 12.5 MG tablet Take 1 tablet (12.5 mg total) by mouth 2 (two) times daily with a meal.   carvedilol (COREG) 12.5 MG tablet Take 1 tablet (12.5 mg total) by mouth 2 (two) times daily with a meal.   cyanocobalamin (VITAMIN B12) 1000 MCG tablet Take 1,000 mcg by mouth daily.   diphenhydrAMINE (BENADRYL) 25 mg capsule Take 25 mg by mouth every 6 (six) hours as needed.   dorzolamide-timolol (COSOPT) 22.3-6.8 MG/ML ophthalmic solution Place 1 drop into both eyes 2 (two) times daily.   levothyroxine (SYNTHROID) 75 MCG tablet TAKE 1 TABLET BY MOUTH EVERY DAY   lisinopril (ZESTRIL) 10 MG tablet Take 1 tablet (10 mg total) by mouth daily.   lisinopril (ZESTRIL) 10 MG tablet Take 1 tablet (10 mg total) by mouth daily.   Multiple Vitamins-Minerals (ZINC PO) Take 1 tablet by mouth daily.   pantoprazole (PROTONIX) 40 MG tablet TAKE 1 TABLET BY MOUTH EVERY DAY (WAITING ON PA)   rosuvastatin (CRESTOR) 10 MG tablet Take 1 tablet (10 mg total) by mouth daily.   sertraline (ZOLOFT) 25 MG tablet Take 1 tablet (25 mg total) by mouth daily.   Zinc 50 MG TABS Take by mouth.   [DISCONTINUED] carvedilol (COREG) 6.25 MG tablet Take 1 tablet (6.25 mg total) by mouth 2 (two) times daily with a meal. (Patient taking differently: Take 12.5 mg by mouth 2 (two) times daily with a meal.)   [DISCONTINUED] guaiFENesin-dextromethorphan  (ROBITUSSIN DM) 100-10 MG/5ML syrup Take 5 mLs by mouth every 4 (four) hours as needed for cough.   [DISCONTINUED] polyethylene glycol (MIRALAX / GLYCOLAX) 17 g packet Take 17 g by mouth daily.   No facility-administered encounter medications on file as of 08/29/2022.     Lab Results  Component Value Date   WBC 8.3 06/08/2022   HGB 13.7 06/08/2022   HCT 38.6 06/08/2022   PLT 243 06/08/2022   GLUCOSE 98 08/11/2022   CHOL 181 08/11/2022   TRIG 120.0 08/11/2022   HDL 70.60 08/11/2022   LDLDIRECT 88.0 03/22/2021   LDLCALC 87 08/11/2022   ALT 22 08/11/2022   AST 16 08/11/2022   NA 137 08/11/2022   K 3.6 08/11/2022   CL 100 08/11/2022   CREATININE 0.81 08/11/2022   BUN 18 08/11/2022   CO2 28 08/11/2022   TSH 2.572 06/08/2022   INR 1.0 08/12/2015    DG Chest Portable 1 View  Result Date: 06/07/2022 CLINICAL DATA:  Weakness. EXAM: PORTABLE CHEST 1 VIEW COMPARISON:  Chest x-ray 02/23/2021 FINDINGS: The heart size and mediastinal contours are within normal limits. Both lungs are clear. The visualized skeletal structures are unremarkable. IMPRESSION: No active disease. Electronically Signed   By: Ronney Asters M.D.   On: 06/07/2022 23:43   CT Head Wo Contrast  Result Date: 06/07/2022 CLINICAL DATA:  Dizziness with nausea and vomiting. EXAM: CT HEAD WITHOUT CONTRAST TECHNIQUE: Contiguous axial images were obtained from the base of the skull through the vertex without intravenous contrast. RADIATION DOSE  REDUCTION: This exam was performed according to the departmental dose-optimization program which includes automated exposure control, adjustment of the mA and/or kV according to patient size and/or use of iterative reconstruction technique. COMPARISON:  None Available. FINDINGS: Brain: There is mild cerebral atrophy with widening of the extra-axial spaces and mild to moderate severity ventricular dilatation. There are areas of decreased attenuation within the white matter tracts of the  supratentorial brain, consistent with microvascular disease changes. Vascular: No hyperdense vessel or unexpected calcification. Skull: Normal. Negative for fracture or focal lesion. Sinuses/Orbits: No acute finding. Other: None. 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 Electronically Signed   By: Virgina Norfolk M.D.   On: 06/07/2022 20:37       Assessment & Plan:   Problem List Items Addressed This Visit     Angioleiomyoma - Primary    Dr Lanora Manis - 07/2022 - recommended annual ultrasound.  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 However the radiologist opined that this could be a malignancy. Hence we will follow-up with his sonogram annually to assess the left kidney mass with the beginning of next year.        Relevant Medications   carvedilol (COREG) 12.5 MG tablet   lisinopril (ZESTRIL) 10 MG tablet   carvedilol (COREG) 12.5 MG tablet   lisinopril (ZESTRIL) 10 MG tablet   Aortic atherosclerosis (HCC)    Continue crestor.        Relevant Medications   carvedilol (COREG) 12.5 MG tablet   lisinopril (ZESTRIL) 10 MG tablet   carvedilol (COREG) 12.5 MG tablet   lisinopril (ZESTRIL) 10 MG tablet   Dizziness    Persistent issue.  Evaluated by ENT.  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  Recommended vestibular rehab and evaluation by neurology.  Neurology evaluation as outlined.  MRI as outlined.  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 Seeing PT for core strengthening, balance and gait issues. Per pt and husband, has not helped. She was referred to the CSF disorder clinic at [facility] for evaluation of possible normal pressure hydrocephalus versus a neurodegenerative condition. A large-volume lumbar puncture was performed on 02/02/22, however only  9m CSF could be drained. 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 CSF biomarkers were negative for AD. It was recommended to proceed with an extended lumbar drain.  Also recommended a DaTscan.  Did not qualify for shunt.  PET scan -  No specific pattern of hypometabolism to suggest a specific etiology of dementia. Ventriculomegaly. Persistent dizziness and unsteady - wide based gait.  Frustrated with above.  Discuss with neurology regarding further intervention.       Esophagitis    Continue protonix.       Hypercholesterolemia    Continue crestor.       Relevant Medications   carvedilol (COREG) 12.5 MG tablet   lisinopril (ZESTRIL) 10 MG tablet   carvedilol (COREG) 12.5 MG tablet   lisinopril (ZESTRIL) 10 MG tablet   Hypertension    Only on amlodipine and carvedilol 12.'5mg'$  bid currently.  Has been off lisinopril and aldactone.  Blood pressure remaining elevated above goal.  Will hold on increasing carvedilol given pulse rate 60. Start lisinopril '10mg'$  q day.  Has previously been on this medication for years. Have them spot check  her pressures.  Send in readings.  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 Did not qualify for shunt placement. S/p procedure she developed HA/Nausea.  these symptoms persisted and went to our ER - sodium 116.  Nephrology was called who recommended admission to [facility] and starting on 3% saline.  Then treated with lasix and fluid restriction. Also salt tablets.  sodium improved.  most of her blood pressure medications were held and she is currently only on amlodipine and carvedilol.  Recent sodium level  wnl.  Starting on lisinopril '10mg'$  q day.  Check metabolic panel in the next 10-14 days.       Hypothyroidism    On thyroid replacement.  Follow tsh.       Relevant Medications   carvedilol (COREG) 12.5 MG tablet   carvedilol (COREG) 12.5 MG tablet   Kidney lesion    Seeing nephrology (11/2021).  Recommended yearly renal ultrasound.       Memory change    Has been evaluated at [facility] as outlined.  Did not qualify for shunt.  Seeing neurology.  Discuss with Dr Manuella Ghazi regarding further intervention.       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 Did not qualify for shunt placement. Seeing neurology here.  Persistent dizziness.        Stress    Continue zoloft.       Unsteady gait    Just released from SNF.  Has been working with home health PT.  Has not helped.  D/w neurology regarding further treament/evaluation.         Einar Pheasant, MD

## 2022-08-30 ENCOUNTER — Encounter: Payer: Self-pay | Admitting: Internal Medicine

## 2022-08-30 ENCOUNTER — Ambulatory Visit: Payer: Medicare Other

## 2022-08-30 NOTE — Assessment & Plan Note (Signed)
Continue protonix  

## 2022-08-30 NOTE — Assessment & Plan Note (Signed)
Seeing nephrology (11/2021).  Recommended yearly renal ultrasound.

## 2022-08-30 NOTE — Assessment & Plan Note (Signed)
On thyroid replacement.  Follow tsh.  

## 2022-08-30 NOTE — Assessment & Plan Note (Signed)
Continue crestor 

## 2022-08-30 NOTE — Assessment & Plan Note (Signed)
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 Did not qualify for shunt placement. S/p procedure she developed HA/Nausea.  these symptoms persisted and went to our ER - sodium 116.  Nephrology was called who recommended admission to [facility] and starting on 3% saline.  Then treated with lasix and fluid restriction. Also salt tablets.  sodium improved.  most of her blood pressure medications were held and she is currently only on amlodipine and carvedilol.  Recent sodium level wnl.  Starting on lisinopril '10mg'$  q day.  Check metabolic panel in the next 10-14 days.

## 2022-08-30 NOTE — Assessment & Plan Note (Signed)
Just released from [facility].  Has been working with home health PT.  Has not helped.  D/w neurology regarding further treament/evaluation.

## 2022-08-30 NOTE — Assessment & Plan Note (Signed)
Persistent issue.  Evaluated by ENT.  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  Recommended vestibular rehab and evaluation by neurology.  Neurology evaluation as outlined.  MRI as outlined.  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 Seeing PT for core strengthening, balance and gait issues. Per pt and husband, has not helped. She was referred to the CSF disorder clinic at [facility] for evaluation of possible normal pressure hydrocephalus versus a neurodegenerative condition. A large-volume lumbar puncture was performed on 02/02/22, however only 71m CSF could be drained. 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 CSF biomarkers were negative for AD. It was recommended to proceed with an extended lumbar drain.  Also recommended a DaTscan.  Did not qualify for shunt.  PET scan -  No specific pattern of hypometabolism to suggest a specific etiology of dementia. Ventriculomegaly. Persistent dizziness and unsteady - wide based gait.  Frustrated with above.  Discuss with neurology regarding further intervention.

## 2022-08-30 NOTE — Assessment & Plan Note (Signed)
Only on amlodipine and carvedilol 12.'5mg'$  bid currently.  Has been off lisinopril and aldactone.  Blood pressure remaining elevated above goal.  Will hold on increasing carvedilol given pulse rate 60. Start lisinopril '10mg'$  q day.  Has previously been on this medication for years. Have them spot check her pressures.  Send in readings.  Follow metabolic panel

## 2022-08-30 NOTE — Assessment & Plan Note (Signed)
Dr Lanora Manis - 07/2022 - recommended annual ultrasound.  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 However the radiologist opined that this could be a malignancy. Hence we will follow-up with his sonogram annually to assess the left kidney mass with the beginning of next year.

## 2022-08-30 NOTE — Assessment & Plan Note (Signed)
Has been evaluated at [facility] as outlined.  Did not qualify for shunt.  Seeing neurology.  Discuss with Dr Manuella Ghazi regarding further intervention.

## 2022-08-30 NOTE — Assessment & Plan Note (Signed)
-

## 2022-08-30 NOTE — Assessment & Plan Note (Signed)
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 Did not qualify for shunt placement. Seeing neurology here.  Persistent dizziness.

## 2022-09-04 ENCOUNTER — Ambulatory Visit: Payer: Medicare Other

## 2022-09-06 ENCOUNTER — Ambulatory Visit: Payer: Medicare Other

## 2022-09-08 ENCOUNTER — Telehealth: Payer: Self-pay | Admitting: Internal Medicine

## 2022-09-08 ENCOUNTER — Other Ambulatory Visit: Payer: Self-pay

## 2022-09-08 DIAGNOSIS — E871 Hypo-osmolality and hyponatremia: Secondary | ICD-10-CM

## 2022-09-08 NOTE — Telephone Encounter (Signed)
Orders placed.

## 2022-09-08 NOTE — Telephone Encounter (Signed)
Patient has a lab appt 09/11/2022, there are no orders in.

## 2022-09-11 ENCOUNTER — Ambulatory Visit: Payer: Medicare Other

## 2022-09-11 ENCOUNTER — Other Ambulatory Visit (INDEPENDENT_AMBULATORY_CARE_PROVIDER_SITE_OTHER): Payer: Medicare Other

## 2022-09-11 DIAGNOSIS — E871 Hypo-osmolality and hyponatremia: Secondary | ICD-10-CM

## 2022-09-12 ENCOUNTER — Other Ambulatory Visit: Payer: Self-pay

## 2022-09-12 DIAGNOSIS — E876 Hypokalemia: Secondary | ICD-10-CM

## 2022-09-12 LAB — BASIC METABOLIC PANEL
BUN: 15 mg/dL (ref 6–23)
CO2: 28 mEq/L (ref 19–32)
Calcium: 10 mg/dL (ref 8.4–10.5)
Chloride: 99 mEq/L (ref 96–112)
Creatinine, Ser: 0.75 mg/dL (ref 0.40–1.20)
GFR: 73.97 mL/min (ref >60.00)
Glucose, Bld: 125 mg/dL — ABNORMAL HIGH (ref 70–99)
Potassium: 3.3 mEq/L — ABNORMAL LOW (ref 3.5–5.1)
Sodium: 136 mEq/L (ref 135–145)

## 2022-09-13 ENCOUNTER — Ambulatory Visit: Payer: Medicare Other

## 2022-09-13 ENCOUNTER — Encounter: Payer: Self-pay | Admitting: Cardiovascular Disease

## 2022-09-13 ENCOUNTER — Ambulatory Visit: Payer: Medicare Other | Attending: Cardiovascular Disease | Admitting: Cardiovascular Disease

## 2022-09-13 VITALS — BP 148/90 | HR 65 | Ht 62.0 in | Wt 126.0 lb

## 2022-09-13 DIAGNOSIS — I10 Essential (primary) hypertension: Secondary | ICD-10-CM | POA: Diagnosis not present

## 2022-09-13 DIAGNOSIS — E785 Hyperlipidemia, unspecified: Secondary | ICD-10-CM | POA: Diagnosis not present

## 2022-09-13 MED ORDER — LISINOPRIL 20 MG PO TABS: 20.0000 mg | ORAL_TABLET | Freq: Every day | ORAL | 1 refills | Status: DC

## 2022-09-13 NOTE — Progress Notes (Signed)
Cardiology Office Note   Date:  09/13/2022   ID:  Rosamund, Nyland 05/18/1941, MRN [id number]  PCP:  Einar Pheasant, MD  Cardiologist:   Kathlyn Sacramento, MD   Chief Complaint  Patient presents with   Other    6 month f/u c/o elevated BP and off balance/dizziness due to hydrocephelitis. Meds reviewed verbally with pt.      History of Present Illness: Crystal Lynch is a 81 y.o. female who presents for  a follow-up visit regarding refractory hypertension. She has no previous cardiac history. Other medical problems include hyperlipidemia and GERD.  Renal artery angiography in October 2016 showed no significant renal artery stenosis.  She continues to have issues with poor balance, memory problems and dizziness.  This is due to normal pressure hydrocephalus and she has been following up at [facility].  She had therapeutic lumbar puncture in August but had minimal improvement in symptoms and thus no shunt was placed.  Shortly after that, she was hospitalized at [facility] with severe hyponatremia at 116.  She was given 3% saline with improvement.  Spironolactone was discontinued and she was placed on amlodipine.  She has been doing reasonably well from a cardiac standpoint with no chest pain, shortness of breath or palpitations.  Blood pressure continues to be elevated.    Past Medical History:  Diagnosis Date   Arthritis    Chicken pox    Endometriosis    Esophagitis    s/p esophageal dilatation   GERD (gastroesophageal reflux disease)    History of ovarian cyst    Hypercholesterolemia    Hypertension    Hypothyroidism     Past Surgical History:  Procedure Laterality Date   ABDOMINAL SURGERY     found to have an ovarian cyst and endometriosis   APPENDECTOMY       Current Outpatient Medications  Medication Sig Dispense Refill   acetaminophen (TYLENOL) 500 MG tablet Take 1 tablet (500 mg total) by mouth every 6 (six) hours as needed for mild pain (or Fever  >/= 101). 30 tablet 0   amLODipine (NORVASC) 5 MG tablet Take 1 tablet (5 mg total) by mouth daily. 30 tablet 3   aspirin 81 MG EC tablet Take 1 tablet (81 mg total) by mouth daily. Swallow whole. 30 tablet 0   Biotin 5 MG CAPS Take 5,000 mcg by mouth daily.     carvedilol (COREG) 6.25 MG tablet Take 6.25 mg by mouth 2 (two) times daily with a meal.     cyanocobalamin (VITAMIN B12) 1000 MCG tablet Take 1,000 mcg by mouth daily.     diphenhydrAMINE (BENADRYL) 25 mg capsule Take 25 mg by mouth every 6 (six) hours as needed.     dorzolamide-timolol (COSOPT) 22.3-6.8 MG/ML ophthalmic solution Place 1 drop into both eyes 2 (two) times daily.     levothyroxine (SYNTHROID) 75 MCG tablet TAKE 1 TABLET BY MOUTH EVERY DAY 90 tablet 1   lisinopril (ZESTRIL) 10 MG tablet Take 1 tablet (10 mg total) by mouth daily. 90 tablet 1   Multiple Vitamins-Minerals (ZINC PO) Take 1 tablet by mouth daily.     pantoprazole (PROTONIX) 40 MG tablet TAKE 1 TABLET BY MOUTH EVERY DAY (WAITING ON PA) 90 tablet 1   rosuvastatin (CRESTOR) 10 MG tablet Take 1 tablet (10 mg total) by mouth daily. 90 tablet 1   sertraline (ZOLOFT) 25 MG tablet Take 1 tablet (25 mg total) by mouth daily. 90 tablet 3  Zinc 50 MG TABS Take by mouth.     No current facility-administered medications for this visit.    Allergies:   Meloxicam    Social History:  The patient  reports that she has never smoked. She has never used smokeless tobacco. She reports that she does not drink alcohol and does not use drugs.   Family History:  The patient's family history includes Breast cancer (age of onset: 55) in her maternal aunt; Breast cancer (age of onset: 71) in her sister; Breast cancer (age of onset: 41) in her mother; Heart attack in her father and mother; Heart disease in her mother; Hyperlipidemia in her mother; Hypertension in her brother, mother, and sister; Lung disease in her father; Rheum arthritis in her sister.    ROS:  Please see the  history of present illness.   Otherwise, review of systems are positive for none.   All other systems are reviewed and negative.    PHYSICAL EXAM: VS:  BP (!) 148/90 (BP Location: Left Arm, Patient Position: Sitting, Cuff Size: Normal)   Pulse 65   Ht '5\' 2"'$  (1.575 m)   Wt 126 lb (57.2 kg)   LMP 10/21/1984   SpO2 97%   BMI 23.05 kg/m  , BMI Body mass index is 23.05 kg/m. GEN: Well nourished, well developed, in no acute distress  HEENT: normal  Neck: no JVD, carotid bruits, or masses Cardiac: RRR; no murmurs, rubs, or gallops,no edema  Respiratory:  clear to auscultation bilaterally, normal work of breathing GI: soft, nontender, nondistended, + BS MS: no deformity or atrophy  Skin: warm and dry, no rash Neuro:  Strength and sensation are intact Psych: euthymic mood, full affect   EKG:  EKG is ordered today. The ekg ordered today demonstrates normal sinus rhythm with no significant ST or T wave changes.  Recent Labs: 06/08/2022: Hemoglobin 13.7; Magnesium 1.9; Platelets 243; TSH 2.572 08/11/2022: ALT 22 09/11/2022: BUN 15; Creatinine, Ser 0.75; Potassium 3.3; Sodium 136    Lipid Panel    Component Value Date/Time   CHOL 181 08/11/2022 0847   TRIG 120.0 08/11/2022 0847   HDL 70.60 08/11/2022 0847   CHOLHDL 3 08/11/2022 0847   VLDL 24.0 08/11/2022 0847   LDLCALC 87 08/11/2022 0847   LDLDIRECT 88.0 03/22/2021 1401      Wt Readings from Last 3 Encounters:  09/13/22 126 lb (57.2 kg)  08/29/22 125 lb 9.6 oz (57 kg)  07/27/22 125 lb 12.8 oz (57.1 kg)           No data to display            ASSESSMENT AND PLAN:   1. Essential hypertension: Blood pressure is elevated likely after discontinuation of spironolactone due to electrolyte abnormalities.  This also explains her low potassium.  I elected to increase lisinopril to 20 mg once daily.  I asked her to increase potassium dietary intake.  She is scheduled to get her potassium checked early next week.  2.  Hyperlipidemia: She is currently on rosuvastatin 10 mg once daily.  Most recent lipid profile showed an LDL of 87.   Disposition:   FU with me in 6 months  Signed,  Kathlyn Sacramento, MD  09/13/2022 8:59 AM    [place]

## 2022-09-13 NOTE — Patient Instructions (Signed)
Medication Instructions:  INCREASE the Lisinopril to 20 mg once daily  *If you need a refill on your cardiac medications before your next appointment, please call your pharmacy*   Lab Work: None ordered If you have labs (blood work) drawn today and your tests are completely normal, you will receive your results only by: [place] (if you have MyChart) OR A paper copy in the mail If you have any lab test that is abnormal or we need to change your treatment, we will call you to review the results.   Testing/Procedures: None ordered   Follow-Up: At [facility], you and your health needs are our priority.  As part of our continuing mission to provide you with exceptional heart care, we have created designated Provider Care Teams.  These Care Teams include your primary Cardiologist (physician) and Advanced Practice Providers (APPs -  Physician Assistants and Nurse Practitioners) who all work together to provide you with the care you need, when you need it.  We recommend signing up for the patient portal called "MyChart".  Sign up information is provided on this After Visit Summary.  MyChart is used to connect with patients for Virtual Visits (Telemedicine).  Patients are able to view lab/test results, encounter notes, upcoming appointments, etc.  Non-urgent messages can be sent to your provider as well.   To learn more about what you can do with MyChart, go to [email or web address].    Your next appointment:   6 month(s)  The format for your next appointment:   In Person  Provider:   You may see Kathlyn Sacramento, MD or one of the following Advanced Practice Providers on your designated Care Team:   Murray Hodgkins, NP Christell Faith, PA-C Cadence Kathlen Mody, PA-C Gerrie Nordmann, NP    Important Information About Sugar

## 2022-09-18 ENCOUNTER — Other Ambulatory Visit (INDEPENDENT_AMBULATORY_CARE_PROVIDER_SITE_OTHER): Payer: Medicare Other

## 2022-09-18 ENCOUNTER — Other Ambulatory Visit: Payer: Medicare Other

## 2022-09-18 DIAGNOSIS — E876 Hypokalemia: Secondary | ICD-10-CM | POA: Diagnosis not present

## 2022-09-19 LAB — POTASSIUM: Potassium: 3.5 mEq/L (ref 3.5–5.1)

## 2022-09-27 DIAGNOSIS — E538 Deficiency of other specified B group vitamins: Secondary | ICD-10-CM

## 2022-09-27 DIAGNOSIS — K2100 Gastro-esophageal reflux disease with esophagitis, without bleeding: Secondary | ICD-10-CM

## 2022-09-27 DIAGNOSIS — Z7982 Long term (current) use of aspirin: Secondary | ICD-10-CM

## 2022-09-27 DIAGNOSIS — E7800 Pure hypercholesterolemia, unspecified: Secondary | ICD-10-CM

## 2022-09-27 DIAGNOSIS — Z9181 History of falling: Secondary | ICD-10-CM

## 2022-09-27 DIAGNOSIS — R413 Other amnesia: Secondary | ICD-10-CM

## 2022-09-27 DIAGNOSIS — I700 Atherosclerosis of aorta: Secondary | ICD-10-CM

## 2022-09-27 DIAGNOSIS — I10 Essential (primary) hypertension: Secondary | ICD-10-CM

## 2022-09-27 DIAGNOSIS — K760 Fatty (change of) liver, not elsewhere classified: Secondary | ICD-10-CM

## 2022-09-27 DIAGNOSIS — G4733 Obstructive sleep apnea (adult) (pediatric): Secondary | ICD-10-CM

## 2022-09-27 DIAGNOSIS — E039 Hypothyroidism, unspecified: Secondary | ICD-10-CM

## 2022-09-27 DIAGNOSIS — G912 (Idiopathic) normal pressure hydrocephalus: Secondary | ICD-10-CM

## 2022-09-27 DIAGNOSIS — M1990 Unspecified osteoarthritis, unspecified site: Secondary | ICD-10-CM

## 2022-10-30 ENCOUNTER — Ambulatory Visit (INDEPENDENT_AMBULATORY_CARE_PROVIDER_SITE_OTHER): Payer: Medicare Other | Admitting: Internal Medicine

## 2022-10-30 ENCOUNTER — Encounter: Payer: Self-pay | Admitting: Internal Medicine

## 2022-10-30 VITALS — BP 162/88 | HR 70 | Temp 97.6°F | Ht 62.0 in | Wt 129.6 lb

## 2022-10-30 DIAGNOSIS — R413 Other amnesia: Secondary | ICD-10-CM

## 2022-10-30 DIAGNOSIS — D219 Benign neoplasm of connective and other soft tissue, unspecified: Secondary | ICD-10-CM | POA: Diagnosis not present

## 2022-10-30 DIAGNOSIS — I700 Atherosclerosis of aorta: Secondary | ICD-10-CM | POA: Diagnosis not present

## 2022-10-30 DIAGNOSIS — I10 Essential (primary) hypertension: Secondary | ICD-10-CM

## 2022-10-30 DIAGNOSIS — R42 Dizziness and giddiness: Secondary | ICD-10-CM | POA: Diagnosis not present

## 2022-10-30 DIAGNOSIS — E871 Hypo-osmolality and hyponatremia: Secondary | ICD-10-CM

## 2022-10-30 DIAGNOSIS — E039 Hypothyroidism, unspecified: Secondary | ICD-10-CM

## 2022-10-30 DIAGNOSIS — E7800 Pure hypercholesterolemia, unspecified: Secondary | ICD-10-CM

## 2022-10-30 DIAGNOSIS — R2681 Unsteadiness on feet: Secondary | ICD-10-CM

## 2022-10-30 DIAGNOSIS — F439 Reaction to severe stress, unspecified: Secondary | ICD-10-CM

## 2022-10-30 DIAGNOSIS — K760 Fatty (change of) liver, not elsewhere classified: Secondary | ICD-10-CM

## 2022-10-30 MED ORDER — CARVEDILOL 12.5 MG PO TABS: 12.5000 mg | ORAL_TABLET | Freq: Two times a day (BID) | ORAL | 3 refills | Status: DC

## 2022-10-30 MED ORDER — LISINOPRIL 40 MG PO TABS: 40.0000 mg | ORAL_TABLET | Freq: Every day | ORAL | 3 refills | Status: DC

## 2022-10-30 NOTE — Progress Notes (Unsigned)
Subjective:    Patient ID: Crystal Lynch, female    DOB: 23-Jul-1941, 82 y.o.   MRN: [id number]  Patient here for  Chief Complaint  Patient presents with   Medical Management of Chronic Issues    HPI Here to follow up regarding her blood pressure, memory and dizziness/unsteadiness.  She is accompanied by her husband.  History obtained from both of them.  Just saw neurology 09/27/22 - for f/u memory loss and imbalance. 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  Evaluated at [facility] (Dr. Domingo Pulse, Dr. Vergia Alcon, Dr. Erlinda Hong). Documentation reviewed. Their team recommended extended lumbar train trial and a DATscan as patient has some parkinsonian features (bradykinesia, rigidity, and abnormal EOMs noted on exam). Lumbar puncture was nondiagnostic as could only drain 84m of CSF. SPECT scan was unremarkable as well.  [facility] removed 300 cc of CSF fluid, dizziness improved, balance and memory/confusion did not improve. She completed in home PT and continues to exercise on her own. Neurology started her on exelon.  Dizziness and unsteadiness - still an issue.  Still with memory concerns. They are requesting to f/u with another neurologist - request second opinion to see if anything more can be done.  No chest pain or sob reported.  Eating.  No abdominal pain reported.  Blood pressure has been a little elevated on outside checks.    Past Medical History:  Diagnosis Date   Arthritis    Chicken pox    Endometriosis    Esophagitis    s/p esophageal dilatation   GERD (gastroesophageal reflux disease)    History of ovarian cyst    Hypercholesterolemia    Hypertension    Hypothyroidism    Past Surgical History:  Procedure Laterality Date   ABDOMINAL SURGERY     found to have an ovarian cyst and endometriosis   APPENDECTOMY     Family History  Problem Relation Age of Onset   Lung  disease Father    Heart attack Father    Heart disease Mother        myocardial infarction   Breast cancer Mother 550  Heart attack Mother    Hypertension Mother    Hyperlipidemia Mother    Breast cancer Sister [phone]  Hypertension Sister    Rheum arthritis Sister    Breast cancer Maternal Aunt [phone]  Hypertension Brother    Colon cancer Neg Hx    Social History   Socioeconomic History   Marital status: Married    Spouse name: Not on file   Number of children: Not on file   Years of education: Not on file   Highest education level: Not on file  Occupational History   Not on file  Tobacco Use   Smoking status: Never   Smokeless tobacco: Never  Vaping Use   Vaping Use: Never used  Substance and Sexual Activity   Alcohol use: No    Alcohol/week: 0.0 standard drinks of alcohol   Drug use: No   Sexual activity: Never  Other Topics Concern   Not on file  Social History Narrative   Not on file   Social Determinants of Health   Financial Resource Strain: Low Risk  (04/28/2022)   Overall Financial Resource Strain (CARDIA)    Difficulty of Paying Living Expenses: Not hard at all  Food Insecurity: No Food Insecurity (04/28/2022)   Hunger Vital Sign  Worried About Charity fundraiser in the Last Year: Never true    [place] in the Last Year: Never true  Transportation Needs: No Transportation Needs (04/28/2022)   PRAPARE - Hydrologist (Medical): No    Lack of Transportation (Non-Medical): No  Physical Activity: Insufficiently Active (04/28/2022)   Exercise Vital Sign    Days of Exercise per Week: 7 days    Minutes of Exercise per Session: 10 min  Stress: No Stress Concern Present (04/28/2022)   [place]    Feeling of Stress : Not at all  Social Connections: Unknown (04/28/2022)   Social Connection and Isolation Panel [NHANES]    Frequency of Communication with Friends and Family:  More than three times a week    Frequency of Social Gatherings with Friends and Family: More than three times a week    Attends Religious Services: More than 4 times per year    Active Member of [employer] or Organizations: Not on file    Attends Archivist Meetings: Not on file    Marital Status: Married     Review of Systems  Constitutional:  Negative for appetite change and unexpected weight change.  HENT:  Negative for congestion and sinus pressure.   Respiratory:  Negative for cough, chest tightness and shortness of breath.   Cardiovascular:  Negative for chest pain, palpitations and leg swelling.  Gastrointestinal:  Negative for abdominal pain, diarrhea, nausea and vomiting.  Genitourinary:  Negative for difficulty urinating and dysuria.  Musculoskeletal:  Negative for joint swelling and myalgias.  Skin:  Negative for color change and rash.  Neurological:  Positive for dizziness.       No increased headache.  Unsteady gait.   Psychiatric/Behavioral:  Negative for agitation and dysphoric mood.        Objective:     BP (!) 162/88   Pulse 70   Temp 97.6 F (36.4 C) (Oral)   Ht '5\' 2"'$  (1.575 m)   Wt 129 lb 9.6 oz (58.8 kg)   LMP 10/21/1984   SpO2 97%   BMI 23.70 kg/m  Wt Readings from Last 3 Encounters:  10/30/22 129 lb 9.6 oz (58.8 kg)  09/13/22 126 lb (57.2 kg)  08/29/22 125 lb 9.6 oz (57 kg)    Physical Exam Vitals reviewed.  Constitutional:      General: She is not in acute distress.    Appearance: Normal appearance.  HENT:     Head: Normocephalic and atraumatic.     Right Ear: External ear normal.     Left Ear: External ear normal.  Eyes:     General: No scleral icterus.       Right eye: No discharge.        Left eye: No discharge.     Conjunctiva/sclera: Conjunctivae normal.  Neck:     Thyroid: No thyromegaly.  Cardiovascular:     Rate and Rhythm: Normal rate and regular rhythm.  Pulmonary:     Effort: No respiratory distress.     Breath  sounds: Normal breath sounds. No wheezing.  Abdominal:     General: Bowel sounds are normal.     Palpations: Abdomen is soft.     Tenderness: There is no abdominal tenderness.  Musculoskeletal:        General: No swelling or tenderness.     Cervical back: Neck supple. No tenderness.  Lymphadenopathy:  Cervical: No cervical adenopathy.  Skin:    Findings: No erythema or rash.  Neurological:     Mental Status: She is alert.  Psychiatric:        Mood and Affect: Mood normal.        Behavior: Behavior normal.      Outpatient Encounter Medications as of 10/30/2022  Medication Sig   acetaminophen (TYLENOL) 500 MG tablet Take 1 tablet (500 mg total) by mouth every 6 (six) hours as needed for mild pain (or Fever >/= 101).   amLODipine (NORVASC) 5 MG tablet Take 1 tablet (5 mg total) by mouth daily.   aspirin 81 MG EC tablet Take 1 tablet (81 mg total) by mouth daily. Swallow whole.   Biotin 5 MG CAPS Take 5,000 mcg by mouth daily.   carvedilol (COREG) 12.5 MG tablet Take 1 tablet (12.5 mg total) by mouth 2 (two) times daily with a meal.   cyanocobalamin (VITAMIN B12) 1000 MCG tablet Take 1,000 mcg by mouth daily.   diphenhydrAMINE (BENADRYL) 25 mg capsule Take 25 mg by mouth every 6 (six) hours as needed.   dorzolamide-timolol (COSOPT) 22.3-6.8 MG/ML ophthalmic solution Place 1 drop into both eyes 2 (two) times daily.   levothyroxine (SYNTHROID) 75 MCG tablet TAKE 1 TABLET BY MOUTH EVERY DAY   lisinopril (ZESTRIL) 40 MG tablet Take 1 tablet (40 mg total) by mouth daily.   Multiple Vitamins-Minerals (ZINC PO) Take 1 tablet by mouth daily.   pantoprazole (PROTONIX) 40 MG tablet TAKE 1 TABLET BY MOUTH EVERY DAY (WAITING ON PA)   rivastigmine (EXELON) 1.5 MG capsule Take 1.5 mg by mouth 2 (two) times daily.   rosuvastatin (CRESTOR) 10 MG tablet Take 1 tablet (10 mg total) by mouth daily.   sertraline (ZOLOFT) 25 MG tablet Take 1 tablet (25 mg total) by mouth daily.   Zinc 50 MG TABS Take  by mouth.   [DISCONTINUED] carvedilol (COREG) 6.25 MG tablet Take 6.25 mg by mouth 2 (two) times daily with a meal.   [DISCONTINUED] lisinopril (ZESTRIL) 20 MG tablet Take 1 tablet (20 mg total) by mouth daily.   No facility-administered encounter medications on file as of 10/30/2022.     Lab Results  Component Value Date   WBC 8.3 06/08/2022   HGB 13.7 06/08/2022   HCT 38.6 06/08/2022   PLT 243 06/08/2022   GLUCOSE 125 (H) 09/11/2022   CHOL 181 08/11/2022   TRIG 120.0 08/11/2022   HDL 70.60 08/11/2022   LDLDIRECT 88.0 03/22/2021   LDLCALC 87 08/11/2022   ALT 22 08/11/2022   AST 16 08/11/2022   NA 136 09/11/2022   K 3.5 09/18/2022   CL 99 09/11/2022   CREATININE 0.75 09/11/2022   BUN 15 09/11/2022   CO2 28 09/11/2022   TSH 2.572 06/08/2022   INR 1.0 08/12/2015    DG Chest Portable 1 View  Result Date: 06/07/2022 CLINICAL DATA:  Weakness. EXAM: PORTABLE CHEST 1 VIEW COMPARISON:  Chest x-ray 02/23/2021 FINDINGS: The heart size and mediastinal contours are within normal limits. Both lungs are clear. The visualized skeletal structures are unremarkable. IMPRESSION: No active disease. Electronically Signed   By: Ronney Asters M.D.   On: 06/07/2022 23:43   CT Head Wo Contrast  Result Date: 06/07/2022 CLINICAL DATA:  Dizziness with nausea and vomiting. EXAM: CT HEAD WITHOUT CONTRAST TECHNIQUE: Contiguous axial images were obtained from the base of the skull through the vertex without intravenous contrast. RADIATION DOSE REDUCTION: This exam was performed according to  the departmental dose-optimization program which includes automated exposure control, adjustment of the mA and/or kV according to patient size and/or use of iterative reconstruction technique. COMPARISON:  None Available. FINDINGS: Brain: There is mild cerebral atrophy with widening of the extra-axial spaces and mild to moderate severity ventricular dilatation. There are areas of decreased attenuation within the white matter  tracts of the supratentorial brain, consistent with microvascular disease changes. Vascular: No hyperdense vessel or unexpected calcification. Skull: Normal. Negative for fracture or focal lesion. Sinuses/Orbits: No acute finding. Other: None. 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 Electronically Signed   By: Virgina Norfolk M.D.   On: 06/07/2022 20:37       Assessment & Plan:  Primary hypertension Assessment & Plan: Has been on amlodipine and carvedilol 12.'5mg'$  bid. Cardiology increased lisinopril to '20mg'$  q day.  Blood pressure remaining elevated above goal.  Will hold on increasing carvedilol given pulse rate 60.  Blood pressure remaining elevated.  Will increase lisinopril to '40mg'$  q day.   Have them spot check her pressures.  Send in readings.  Follow metabolic panel    Angioleiomyoma Assessment & Plan: Dr Lanora Manis - 07/2022 - recommended annual ultrasound.  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 However the radiologist opined that this could be a malignancy. Hence we will follow-up with his sonogram annually to assess the left kidney mass with the beginning of next year.      Aortic atherosclerosis ([place]) Assessment & Plan: Continue crestor.     Dizziness Assessment & Plan: Persistent issue.  Evaluated by ENT.  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  Recommended vestibular rehab and evaluation by neurology.  Neurology evaluation as outlined.  MRI as outlined.  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 Had PT for core strengthening, balance and gait issues. She was referred to the CSF disorder clinic at [facility] for evaluation of possible normal pressure hydrocephalus versus a neurodegenerative condition. A large-volume lumbar puncture was  performed on 02/02/22, however only 72m CSF could be drained. 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 CSF biomarkers were negative for AD. It was recommended to proceed with an extended lumbar drain.  Also recommended a DaTscan - pt had some parkinsonian features (bradykinesia, rigidity, and abnormal EOMs noted on exam). Lumbar puncture was nondiagnostic as could only drain 853mof CSF. SPECT scan was unremarkable as well.  [facility], removed 300 cc of CSF fluid, dizziness improved, balance and memory/confusion did not improve.Did not qualify for shunt.  PET scan -  No specific pattern of hypometabolism to suggest a specific etiology of dementia. Ventriculomegaly. Persistent dizziness and unsteady - wide based gait.  Started on exelon.  Request neurology referral for second opinion.   Orders: -     Ambulatory referral to Neurology  Fatty liver Assessment & Plan: Found on ultrasound.  Follow liver function tests.     Hypercholesterolemia Assessment & Plan: Continue crestor.    Hyponatremia Assessment & Plan: Off aldactone.  Last sodium level wnl.    Hypothyroidism, unspecified type Assessment & Plan: On thyroid replacement.  Follow tsh.    Memory change Assessment & Plan: [facility]  Did not qualify for shunt.  Seeing neurology.  Patient completed Lumbar drain trial at [facility] 05/29/2022, removed 300 cc of CSF fluid, dizziness improved, balance and memory/confusion did not improve. Recently started on exelon.  Follow.    Stress Assessment & Plan: Continue zoloft.    Unsteady gait Assessment & Plan:  Evaluated at [facility] (Dr. Domingo Pulse, Dr. Vergia Alcon, Dr. Erlinda Hong). Documentation reviewed. Their team recommended extended lumbar train trial and a DATscan as patient has some parkinsonian features (bradykinesia, rigidity, and  abnormal EOMs noted on exam). Lumbar puncture was nondiagnostic as could only drain 46m of CSF. SPECT scan was unremarkable as well.  [facility], removed 300 cc of CSF fluid, dizziness improved, balance and memory/confusion did not improve. She completed in home PT and continues to exercise on her own. Given persistent issues, request referral to neurology for second opinion.    Orders: -     Ambulatory referral to Neurology  Other orders -     Carvedilol; Take 1 tablet (12.5 mg total) by mouth 2 (two) times daily with a meal.  Dispense: 180 tablet; Refill: 3 -     Lisinopril; Take 1 tablet (40 mg total) by mouth daily.  Dispense: 90 tablet; Refill: 3     CEinar Pheasant MD

## 2022-11-01 ENCOUNTER — Encounter: Payer: Self-pay | Admitting: Internal Medicine

## 2022-11-01 NOTE — Assessment & Plan Note (Signed)
Off aldactone.  Last sodium level wnl.

## 2022-11-01 NOTE — Assessment & Plan Note (Signed)
Dr Lanora Manis - 07/2022 - recommended annual ultrasound.  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 However the radiologist opined that this could be a malignancy. Hence we will follow-up with his sonogram annually to assess the left kidney mass with the beginning of next year.

## 2022-11-01 NOTE — Assessment & Plan Note (Signed)
Found on ultrasound.  Follow liver function tests.

## 2022-11-01 NOTE — Assessment & Plan Note (Signed)
On thyroid replacement.  Follow tsh.  

## 2022-11-01 NOTE — Assessment & Plan Note (Signed)
Continue crestor 

## 2022-11-01 NOTE — Assessment & Plan Note (Signed)
-

## 2022-11-01 NOTE — Assessment & Plan Note (Signed)
Persistent issue.  Evaluated by ENT.  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  Recommended vestibular rehab and evaluation by neurology.  Neurology evaluation as outlined.  MRI as outlined.  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 Had PT for core strengthening, balance and gait issues. She was referred to the CSF disorder clinic at [facility] for evaluation of possible normal pressure hydrocephalus versus a neurodegenerative condition. A large-volume lumbar puncture was performed on 02/02/22, however only 63m CSF could be drained. 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 CSF biomarkers were negative for AD. It was recommended to proceed with an extended lumbar drain.  Also recommended a DaTscan - pt had some parkinsonian features (bradykinesia, rigidity, and abnormal EOMs noted on exam). Lumbar puncture was nondiagnostic as could only drain 85mof CSF. SPECT scan was unremarkable as well.  [facility] removed 300 cc of CSF fluid, dizziness improved, balance and memory/confusion did not improve.Did not qualify for shunt.  PET scan -  No specific pattern of hypometabolism to suggest a specific etiology of dementia. Ventriculomegaly. Persistent dizziness and unsteady - wide based gait.  Started on exelon.  Request neurology referral for second opinion.

## 2022-11-01 NOTE — Assessment & Plan Note (Signed)
Has been evaluated at [facility] as outlined.  Did not qualify for shunt.  Seeing neurology.  Patient completed Lumbar drain trial at [facility] 05/29/2022, removed 300 cc of CSF fluid, dizziness improved, balance and memory/confusion did not improve. Recently started on exelon.  Follow.

## 2022-11-01 NOTE — Assessment & Plan Note (Signed)
Evaluated at [facility] (Dr. Domingo Pulse, Dr. Vergia Alcon, Dr. Erlinda Hong). Documentation reviewed. Their team recommended extended lumbar train trial and a DATscan as patient has some parkinsonian features (bradykinesia, rigidity, and abnormal EOMs noted on exam). Lumbar puncture was nondiagnostic as could only drain 24m of CSF. SPECT scan was unremarkable as well.  [facility] removed 300 cc of CSF fluid, dizziness improved, balance and memory/confusion did not improve. She completed in home PT and continues to exercise on her own. Given persistent issues, request referral to neurology for second opinion.

## 2022-11-01 NOTE — Assessment & Plan Note (Signed)
Has been on amlodipine and carvedilol 12.'5mg'$  bid. Cardiology increased lisinopril to '20mg'$  q day.  Blood pressure remaining elevated above goal.  Will hold on increasing carvedilol given pulse rate 60.  Blood pressure remaining elevated.  Will increase lisinopril to '40mg'$  q day.   Have them spot check her pressures.  Send in readings.  Follow metabolic panel

## 2022-11-06 ENCOUNTER — Other Ambulatory Visit: Payer: Self-pay | Admitting: Internal Medicine

## 2022-11-22 ENCOUNTER — Encounter: Payer: Self-pay | Admitting: Internal Medicine

## 2022-11-22 NOTE — Telephone Encounter (Signed)
Hey Rasheedah!  Pt is asking about Neuro 2nd opinion referral. I read the notes, but I just wanted to clarify. They wrote nothing to add .Marland Kitchen Does that mean they denied the referral ?

## 2022-11-22 NOTE — Telephone Encounter (Signed)
Thank you.  Yes need the follow up.  Please also let family know and see how they would like to proceed.

## 2022-11-23 ENCOUNTER — Telehealth: Payer: Self-pay | Admitting: Internal Medicine

## 2022-11-23 NOTE — Telephone Encounter (Signed)
Pt called in staying that she might have an UTI, and if its possible for her to drop off some urine samples? If Dr. Nicki Reaper can put an order in for her?? She's available [phone] if any questions.

## 2022-11-23 NOTE — Telephone Encounter (Signed)
Lm for pt cb

## 2022-11-24 ENCOUNTER — Ambulatory Visit (INDEPENDENT_AMBULATORY_CARE_PROVIDER_SITE_OTHER): Payer: Medicare Other | Admitting: Nurse Practitioner

## 2022-11-24 ENCOUNTER — Encounter: Payer: Self-pay | Admitting: Nurse Practitioner

## 2022-11-24 VITALS — BP 120/76 | HR 63 | Temp 97.3°F | Ht 62.0 in | Wt 131.2 lb

## 2022-11-24 DIAGNOSIS — N390 Urinary tract infection, site not specified: Secondary | ICD-10-CM

## 2022-11-24 DIAGNOSIS — R300 Dysuria: Secondary | ICD-10-CM | POA: Diagnosis not present

## 2022-11-24 DIAGNOSIS — N3000 Acute cystitis without hematuria: Secondary | ICD-10-CM

## 2022-11-24 LAB — POC URINALSYSI DIPSTICK (AUTOMATED)
Bilirubin, UA: NEGATIVE
Blood, UA: NEGATIVE
Glucose, UA: NEGATIVE
Nitrite, UA: POSITIVE
Protein, UA: NEGATIVE
Spec Grav, UA: 1.01 (ref 1.010–1.025)
Urobilinogen, UA: >=8 U/dL — CL
pH, UA: 6 (ref 5.0–8.0)

## 2022-11-24 MED ORDER — NITROFURANTOIN MONOHYD MACRO 100 MG PO CAPS: 100.0000 mg | ORAL_CAPSULE | Freq: Two times a day (BID) | ORAL | 0 refills | Status: DC

## 2022-11-24 NOTE — Telephone Encounter (Signed)
Patient called and appt made with [facility] this afternoon, 11/24/22.

## 2022-11-24 NOTE — Telephone Encounter (Signed)
Noted  

## 2022-11-24 NOTE — Patient Instructions (Signed)
Urinalysis shows possible infection and send urine for culture. Started on antibiotic twice a day for 5 days. Take OTC azo for burning. Will call with the result of culture.

## 2022-11-24 NOTE — Telephone Encounter (Signed)
LM for pt to cb 

## 2022-11-24 NOTE — Progress Notes (Unsigned)
Established Patient Office Visit  Subjective:  Patient ID: Crystal Lynch, female    DOB: May 13, 1941  Age: 82 y.o. MRN: [id number]  CC:  Chief Complaint  Patient presents with   Urinary Tract Infection    Low Back Pain Burning when urinating Frequency A little blood in urine past few days     HPI  Crystal Lynch presents for dysuria, frequency, low back pain since yesterday. She does not have blood in the urine but when she wipped she saw brownsih reddish stuff on the tissue. No vaginal discharge. Have abdominal pain.  Urinary Tract Infection      Past Medical History:  Diagnosis Date   Arthritis    Chicken pox    Endometriosis    Esophagitis    s/p esophageal dilatation   GERD (gastroesophageal reflux disease)    History of ovarian cyst    Hypercholesterolemia    Hypertension    Hypothyroidism     Past Surgical History:  Procedure Laterality Date   ABDOMINAL SURGERY     found to have an ovarian cyst and endometriosis   APPENDECTOMY      Family History  Problem Relation Age of Onset   Lung disease Father    Heart attack Father    Heart disease Mother        myocardial infarction   Breast cancer Mother 90   Heart attack Mother    Hypertension Mother    Hyperlipidemia Mother    Breast cancer Sister 69   Hypertension Sister    Rheum arthritis Sister    Breast cancer Maternal Aunt 33   Hypertension Brother    Colon cancer Neg Hx     Social History   Socioeconomic History   Marital status: Married    Spouse name: Not on file   Number of children: Not on file   Years of education: Not on file   Highest education level: Not on file  Occupational History   Not on file  Tobacco Use   Smoking status: Never   Smokeless tobacco: Never  Vaping Use   Vaping Use: Never used  Substance and Sexual Activity   Alcohol use: No    Alcohol/week: 0.0 standard drinks of alcohol   Drug use: No   Sexual activity: Never  Other Topics Concern   Not on  file  Social History Narrative   Not on file   Social Determinants of Health   Financial Resource Strain: Low Risk  (04/28/2022)   Overall Financial Resource Strain (CARDIA)    Difficulty of Paying Living Expenses: Not hard at all  Food Insecurity: No Food Insecurity (04/28/2022)   Hunger Vital Sign    Worried About Running Out of Food in the Last Year: Never true    Ran Out of Food in the Last Year: Never true  Transportation Needs: No Transportation Needs (04/28/2022)   PRAPARE - Hydrologist (Medical): No    Lack of Transportation (Non-Medical): No  Physical Activity: Insufficiently Active (04/28/2022)   Exercise Vital Sign    Days of Exercise per Week: 7 days    Minutes of Exercise per Session: 10 min  Stress: No Stress Concern Present (04/28/2022)   [place]    Feeling of Stress : Not at all  Social Connections: Unknown (04/28/2022)   Social Connection and Isolation Panel [NHANES]    Frequency of Communication with Friends and Family: More than  three times a week    Frequency of Social Gatherings with Friends and Family: More than three times a week    Attends Religious Services: More than 4 times per year    Active Member of [employer] or Organizations: Not on file    Attends Archivist Meetings: Not on file    Marital Status: Married  Intimate Partner Violence: Not At Risk (04/28/2022)   Humiliation, Afraid, Rape, and Kick questionnaire    Fear of Current or Ex-Partner: No    Emotionally Abused: No    Physically Abused: No    Sexually Abused: No     Outpatient Medications Prior to Visit  Medication Sig Dispense Refill   acetaminophen (TYLENOL) 500 MG tablet Take 1 tablet (500 mg total) by mouth every 6 (six) hours as needed for mild pain (or Fever >/= 101). 30 tablet 0   amLODipine (NORVASC) 5 MG tablet TAKE ONE TABLET BY MOUTH EVERY DAY 30 tablet 5   aspirin 81 MG EC tablet Take  1 tablet (81 mg total) by mouth daily. Swallow whole. 30 tablet 0   Biotin 5 MG CAPS Take 5,000 mcg by mouth daily.     carvedilol (COREG) 12.5 MG tablet Take 1 tablet (12.5 mg total) by mouth 2 (two) times daily with a meal. 180 tablet 3   cyanocobalamin (VITAMIN B12) 1000 MCG tablet Take 1,000 mcg by mouth daily.     diphenhydrAMINE (BENADRYL) 25 mg capsule Take 25 mg by mouth every 6 (six) hours as needed.     dorzolamide-timolol (COSOPT) 22.3-6.8 MG/ML ophthalmic solution Place 1 drop into both eyes 2 (two) times daily.     levothyroxine (SYNTHROID) 75 MCG tablet TAKE 1 TABLET BY MOUTH EVERY DAY 90 tablet 1   lisinopril (ZESTRIL) 40 MG tablet Take 1 tablet (40 mg total) by mouth daily. 90 tablet 3   Multiple Vitamins-Minerals (ZINC PO) Take 1 tablet by mouth daily.     pantoprazole (PROTONIX) 40 MG tablet TAKE 1 TABLET BY MOUTH EVERY DAY (WAITING ON PA) 90 tablet 1   rivastigmine (EXELON) 1.5 MG capsule Take 1.5 mg by mouth 2 (two) times daily.     rosuvastatin (CRESTOR) 10 MG tablet Take 1 tablet (10 mg total) by mouth daily. 90 tablet 1   sertraline (ZOLOFT) 25 MG tablet Take 1 tablet (25 mg total) by mouth daily. 90 tablet 3   Zinc 50 MG TABS Take by mouth.     No facility-administered medications prior to visit.    Allergies  Allergen Reactions   Meloxicam     BP issues    ROS Review of Systems  Genitourinary:  Positive for dysuria. Negative for vaginal discharge.  Musculoskeletal:  Positive for back pain.      Objective:    Physical Exam Constitutional:      Appearance: Normal appearance.  Cardiovascular:     Rate and Rhythm: Normal rate and regular rhythm.     Pulses: Normal pulses.     Heart sounds: Normal heart sounds.  Pulmonary:     Effort: Pulmonary effort is normal.     Breath sounds: Normal breath sounds.  Abdominal:     General: Bowel sounds are normal.     Palpations: Abdomen is soft. There is no mass.     Tenderness: There is no abdominal tenderness.   Neurological:     Mental Status: She is alert.     BP 120/76   Pulse 63   Temp (!) 97.3 F (  36.3 C)   Ht '5\' 2"'$  (1.575 m)   Wt 131 lb 3.2 oz (59.5 kg)   LMP 10/21/1984   SpO2 99%   BMI 24.00 kg/m  Wt Readings from Last 3 Encounters:  11/24/22 131 lb 3.2 oz (59.5 kg)  10/30/22 129 lb 9.6 oz (58.8 kg)  09/13/22 126 lb (57.2 kg)     Health Maintenance  Topic Date Due   COVID-19 Vaccine (4 - 2023-24 season) 12/10/2022 (Originally 06/23/2022)   Medicare Annual Wellness (AWV)  04/29/2023   MAMMOGRAM  05/20/2023   DTaP/Tdap/Td (2 - Td or Tdap) 03/03/2030   Pneumonia Vaccine 28+ Years old  Completed   INFLUENZA VACCINE  Completed   DEXA SCAN  Completed   Zoster Vaccines- Shingrix  Completed   HPV VACCINES  Aged Out    There are no preventive care reminders to display for this patient.  Lab Results  Component Value Date   TSH 2.572 06/08/2022   Lab Results  Component Value Date   WBC 8.3 06/08/2022   HGB 13.7 06/08/2022   HCT 38.6 06/08/2022   MCV 87.5 06/08/2022   PLT 243 06/08/2022   Lab Results  Component Value Date   NA 136 09/11/2022   K 3.5 09/18/2022   CO2 28 09/11/2022   GLUCOSE 125 (H) 09/11/2022   BUN 15 09/11/2022   CREATININE 0.75 09/11/2022   BILITOT 1.4 (H) 08/11/2022   ALKPHOS 55 08/11/2022   AST 16 08/11/2022   ALT 22 08/11/2022   PROT 6.5 08/11/2022   ALBUMIN 4.5 08/11/2022   CALCIUM 10.0 09/11/2022   ANIONGAP 8 06/11/2022   EGFR 74 01/26/2022   GFR 73.97 09/11/2022   Lab Results  Component Value Date   CHOL 181 08/11/2022   Lab Results  Component Value Date   HDL 70.60 08/11/2022   Lab Results  Component Value Date   LDLCALC 87 08/11/2022   Lab Results  Component Value Date   TRIG 120.0 08/11/2022   Lab Results  Component Value Date   CHOLHDL 3 08/11/2022   No results found for: "HGBA1C"    Assessment & Plan:   Problem List Items Addressed This Visit       Genitourinary   UTI (urinary tract infection) - Primary    Frequent urinary tract infections   Relevant Orders   POCT Urinalysis Dipstick (Automated) (Completed)   Other Visit Diagnoses     Dysuria       Relevant Orders   POCT Urinalysis Dipstick (Automated) (Completed)        No orders of the defined types were placed in this encounter.    Follow-up: No follow-ups on file.    Theresia Lo, NP

## 2022-11-26 LAB — URINE CULTURE
MICRO NUMBER:: 14511626
SPECIMEN QUALITY:: ADEQUATE

## 2022-11-29 NOTE — Assessment & Plan Note (Signed)
Urinalysis is positive for leukocyte and urobilinogen. Send urine for culture. Started her on nitrofurantoin 10 mg twice a day for 5 days. Advised patient to take OTC Azo for burning. Increase fluid intake.

## 2022-11-30 NOTE — Telephone Encounter (Signed)
Are they willing to go back to Dr Manuella Ghazi with these specific concerns?

## 2022-12-01 NOTE — Progress Notes (Signed)
Her culture came out to be positive for E. coli and the antibiotic she is on would take care of it.

## 2022-12-04 ENCOUNTER — Telehealth: Payer: Self-pay | Admitting: Internal Medicine

## 2022-12-04 NOTE — Telephone Encounter (Signed)
Prescription Request  12/04/2022  Is this a "Controlled Substance" medicine? No  LOV: 10/30/2022  What is the name of the medication or equipment? amLODipine (NORVASC) 5 MG tablet and rosuvastatin (CRESTOR) 10 MG tablet  Have you contacted your pharmacy to request a refill? Yes   Which pharmacy would you like this sent to?   [id number] [id number]: [phone]: [phone]    Patient notified that their request is being sent to the clinical staff for review and that they should receive a response within 2 business days.   [place]

## 2022-12-05 ENCOUNTER — Other Ambulatory Visit: Payer: Self-pay

## 2022-12-05 MED ORDER — ROSUVASTATIN CALCIUM 10 MG PO TABS: 10.0000 mg | ORAL_TABLET | Freq: Every day | ORAL | 1 refills | Status: DC

## 2022-12-05 MED ORDER — AMLODIPINE BESYLATE 5 MG PO TABS: 5.0000 mg | ORAL_TABLET | Freq: Every day | ORAL | 5 refills | Status: DC

## 2022-12-05 NOTE — Telephone Encounter (Signed)
sent

## 2022-12-09 ENCOUNTER — Telehealth: Payer: Self-pay | Admitting: Internal Medicine

## 2022-12-11 ENCOUNTER — Other Ambulatory Visit: Payer: Self-pay | Admitting: Internal Medicine

## 2022-12-11 NOTE — Telephone Encounter (Signed)
Pt need a refill on pantoprazole sent to [facility]

## 2022-12-12 NOTE — Telephone Encounter (Signed)
Refill sent.

## 2022-12-13 ENCOUNTER — Other Ambulatory Visit (HOSPITAL_COMMUNITY): Payer: Self-pay

## 2022-12-13 NOTE — Telephone Encounter (Addendum)
Pharmacy Patient Advocate Encounter  Prior Authorization for Pantoprazole has been approved.    PA# [id number] Effective dates: 12/13/2022 through 12/13/2023   Patient Advocate Encounter   Received notification from [place] that prior authorization for Pantoprazole is required.   PA submitted on 12/13/2022 [facility] Status is pending

## 2022-12-15 ENCOUNTER — Other Ambulatory Visit: Payer: Self-pay

## 2022-12-15 MED ORDER — PANTOPRAZOLE SODIUM 40 MG PO TBEC: DELAYED_RELEASE_TABLET | ORAL | 1 refills | Status: DC

## 2022-12-25 ENCOUNTER — Encounter: Payer: Self-pay | Admitting: Internal Medicine

## 2022-12-25 ENCOUNTER — Ambulatory Visit (INDEPENDENT_AMBULATORY_CARE_PROVIDER_SITE_OTHER): Payer: Medicare Other | Admitting: Internal Medicine

## 2022-12-25 VITALS — BP 132/70 | HR 78 | Temp 98.2°F | Resp 16 | Ht 62.0 in | Wt 127.4 lb

## 2022-12-25 DIAGNOSIS — Z0000 Encounter for general adult medical examination without abnormal findings: Secondary | ICD-10-CM | POA: Diagnosis not present

## 2022-12-25 DIAGNOSIS — D219 Benign neoplasm of connective and other soft tissue, unspecified: Secondary | ICD-10-CM

## 2022-12-25 DIAGNOSIS — E7800 Pure hypercholesterolemia, unspecified: Secondary | ICD-10-CM

## 2022-12-25 DIAGNOSIS — R42 Dizziness and giddiness: Secondary | ICD-10-CM

## 2022-12-25 DIAGNOSIS — M25552 Pain in left hip: Secondary | ICD-10-CM

## 2022-12-25 DIAGNOSIS — R413 Other amnesia: Secondary | ICD-10-CM

## 2022-12-25 DIAGNOSIS — G912 (Idiopathic) normal pressure hydrocephalus: Secondary | ICD-10-CM

## 2022-12-25 DIAGNOSIS — K760 Fatty (change of) liver, not elsewhere classified: Secondary | ICD-10-CM

## 2022-12-25 DIAGNOSIS — I700 Atherosclerosis of aorta: Secondary | ICD-10-CM

## 2022-12-25 DIAGNOSIS — F439 Reaction to severe stress, unspecified: Secondary | ICD-10-CM

## 2022-12-25 DIAGNOSIS — Z1231 Encounter for screening mammogram for malignant neoplasm of breast: Secondary | ICD-10-CM

## 2022-12-25 DIAGNOSIS — E039 Hypothyroidism, unspecified: Secondary | ICD-10-CM

## 2022-12-25 DIAGNOSIS — I10 Essential (primary) hypertension: Secondary | ICD-10-CM

## 2022-12-25 NOTE — Progress Notes (Signed)
Subjective:    Patient ID: Crystal Lynch, female    DOB: 1941/01/01, 82 y.o.   MRN: [id number]  Patient here for  Chief Complaint  Patient presents with   Annual Exam    HPI Here for physical exam.  saw neurology 09/27/22 - for f/u memory loss and imbalance. 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  Evaluated at [facility] (Dr. Domingo Pulse, Dr. Vergia Alcon, Dr. Erlinda Hong). Documentation reviewed. Their team recommended extended lumbar train trial and a DATscan as patient has some parkinsonian features (bradykinesia, rigidity, and abnormal EOMs noted on exam). Lumbar puncture was nondiagnostic as could only drain 52m of CSF. SPECT scan was unremarkable as well.  [facility], removed 300 cc of CSF fluid, dizziness improved, balance and memory/confusion did not improve. She completed in home PT and continues to exercise on her own. Neurology started her on exelon. She reports still feeling like she is in a fog.  Thinking not as clear.  She is trying to walk more.  If turns fast, feels off.  Is getting around better than initially.  No chest pain.  Breathing stable.  No increased cough or congestion.  No abdominal pain or bowel change. Handling stress ok.  Supportive family.    Past Medical History:  Diagnosis Date   Arthritis    Chicken pox    Endometriosis    Esophagitis    s/p esophageal dilatation   GERD (gastroesophageal reflux disease)    History of ovarian cyst    Hypercholesterolemia    Hypertension    Hypothyroidism    Past Surgical History:  Procedure Laterality Date   ABDOMINAL SURGERY     found to have an ovarian cyst and endometriosis   APPENDECTOMY     Family History  Problem Relation Age of Onset   Lung disease Father    Heart attack Father    Heart disease Mother        myocardial infarction   Breast cancer Mother 549  Heart attack Mother    Hypertension Mother     Hyperlipidemia Mother    Breast cancer Sister 560  Hypertension Sister    Rheum arthritis Sister    Breast cancer Maternal Aunt 562  Hypertension Brother    Colon cancer Neg Hx    Social History   Socioeconomic History   Marital status: Married    Spouse name: Not on file   Number of children: Not on file   Years of education: Not on file   Highest education level: Not on file  Occupational History   Not on file  Tobacco Use   Smoking status: Never   Smokeless tobacco: Never  Vaping Use   Vaping Use: Never used  Substance and Sexual Activity   Alcohol use: No    Alcohol/week: 0.0 standard drinks of alcohol   Drug use: No   Sexual activity: Never  Other Topics Concern   Not on file  Social History Narrative   Not on file   Social Determinants of Health   Financial Resource Strain: Low Risk  (04/28/2022)   Overall Financial Resource Strain (CARDIA)    Difficulty of Paying Living Expenses: Not hard at all  Food Insecurity: No Food Insecurity (04/28/2022)   Hunger Vital Sign    Worried About Running Out of Food in the Last Year: Never true    Ran Out of Food in the Last  Year: Never true  Transportation Needs: No Transportation Needs (04/28/2022)   PRAPARE - Hydrologist (Medical): No    Lack of Transportation (Non-Medical): No  Physical Activity: Insufficiently Active (04/28/2022)   Exercise Vital Sign    Days of Exercise per Week: 7 days    Minutes of Exercise per Session: 10 min  Stress: No Stress Concern Present (04/28/2022)   [place]    Feeling of Stress : Not at all  Social Connections: Unknown (04/28/2022)   Social Connection and Isolation Panel [NHANES]    Frequency of Communication with Friends and Family: More than three times a week    Frequency of Social Gatherings with Friends and Family: More than three times a week    Attends Religious Services: More than 4 times per  year    Active Member of [employer] or Organizations: Not on file    Attends Archivist Meetings: Not on file    Marital Status: Married     Review of Systems  Constitutional:  Negative for appetite change and unexpected weight change.  HENT:  Negative for congestion, sinus pressure and sore throat.   Eyes:  Negative for pain and visual disturbance.  Respiratory:  Negative for cough, chest tightness and shortness of breath.   Cardiovascular:  Negative for chest pain and palpitations.  Gastrointestinal:  Negative for abdominal pain, diarrhea, nausea and vomiting.  Genitourinary:  Negative for difficulty urinating and dysuria.  Musculoskeletal:  Negative for joint swelling and myalgias.  Skin:  Negative for color change and rash.  Neurological:  Negative for dizziness and headaches.  Hematological:  Negative for adenopathy. Does not bruise/bleed easily.  Psychiatric/Behavioral:  Negative for agitation and dysphoric mood.        Objective:     BP 132/70   Pulse 78   Temp 98.2 F (36.8 C)   Resp 16   Ht '5\' 2"'$  (1.575 m)   Wt 127 lb 6.4 oz (57.8 kg)   LMP 10/21/1984   SpO2 98%   BMI 23.30 kg/m  Wt Readings from Last 3 Encounters:  12/25/22 127 lb 6.4 oz (57.8 kg)  11/24/22 131 lb 3.2 oz (59.5 kg)  10/30/22 129 lb 9.6 oz (58.8 kg)    Physical Exam Vitals reviewed.  Constitutional:      General: She is not in acute distress.    Appearance: Normal appearance. She is well-developed.  HENT:     Head: Normocephalic and atraumatic.     Right Ear: External ear normal.     Left Ear: External ear normal.  Eyes:     General: No scleral icterus.       Right eye: No discharge.        Left eye: No discharge.     Conjunctiva/sclera: Conjunctivae normal.  Neck:     Thyroid: No thyromegaly.  Cardiovascular:     Rate and Rhythm: Normal rate and regular rhythm.  Pulmonary:     Effort: No tachypnea, accessory muscle usage or respiratory distress.     Breath sounds: Normal  breath sounds. No decreased breath sounds or wheezing.  Chest:  Breasts:    Right: No inverted nipple, mass, nipple discharge or tenderness (no axillary adenopathy).     Left: No inverted nipple, mass, nipple discharge or tenderness (no axilarry adenopathy).  Abdominal:     General: Bowel sounds are normal.     Palpations: Abdomen is soft.  Tenderness: There is no abdominal tenderness.  Musculoskeletal:        General: No swelling or tenderness.     Cervical back: Neck supple.  Lymphadenopathy:     Cervical: No cervical adenopathy.  Skin:    Findings: No erythema or rash.  Neurological:     Mental Status: She is alert and oriented to person, place, and time.  Psychiatric:        Mood and Affect: Mood normal.        Behavior: Behavior normal.      Outpatient Encounter Medications as of 12/25/2022  Medication Sig   acetaminophen (TYLENOL) 500 MG tablet Take 1 tablet (500 mg total) by mouth every 6 (six) hours as needed for mild pain (or Fever >/= 101).   amLODipine (NORVASC) 5 MG tablet Take 1 tablet (5 mg total) by mouth daily.   aspirin 81 MG EC tablet Take 1 tablet (81 mg total) by mouth daily. Swallow whole.   Biotin 5 MG CAPS Take 5,000 mcg by mouth daily.   carvedilol (COREG) 12.5 MG tablet Take 1 tablet (12.5 mg total) by mouth 2 (two) times daily with a meal.   cyanocobalamin (VITAMIN B12) 1000 MCG tablet Take 1,000 mcg by mouth daily.   diphenhydrAMINE (BENADRYL) 25 mg capsule Take 25 mg by mouth every 6 (six) hours as needed.   dorzolamide-timolol (COSOPT) 22.3-6.8 MG/ML ophthalmic solution Place 1 drop into both eyes 2 (two) times daily.   levothyroxine (SYNTHROID) 75 MCG tablet TAKE 1 TABLET BY MOUTH EVERY DAY   lisinopril (ZESTRIL) 40 MG tablet Take 1 tablet (40 mg total) by mouth daily.   Multiple Vitamins-Minerals (ZINC PO) Take 1 tablet by mouth daily.   pantoprazole (PROTONIX) 40 MG tablet TAKE 1 TABLET BY MOUTH EVERY DAY   rivastigmine (EXELON) 1.5 MG capsule  Take 1.5 mg by mouth 2 (two) times daily.   rosuvastatin (CRESTOR) 10 MG tablet Take 1 tablet (10 mg total) by mouth daily.   sertraline (ZOLOFT) 25 MG tablet TAKE 1 TABLET (25 MG TOTAL) BY MOUTH DAILY.   Zinc 50 MG TABS Take by mouth.   [DISCONTINUED] nitrofurantoin, macrocrystal-monohydrate, (MACROBID) 100 MG capsule Take 1 capsule (100 mg total) by mouth 2 (two) times daily.   No facility-administered encounter medications on file as of 12/25/2022.     Lab Results  Component Value Date   WBC 8.3 06/08/2022   HGB 13.7 06/08/2022   HCT 38.6 06/08/2022   PLT 243 06/08/2022   GLUCOSE 125 (H) 09/11/2022   CHOL 181 08/11/2022   TRIG 120.0 08/11/2022   HDL 70.60 08/11/2022   LDLDIRECT 88.0 03/22/2021   LDLCALC 87 08/11/2022   ALT 22 08/11/2022   AST 16 08/11/2022   NA 136 09/11/2022   K 3.5 09/18/2022   CL 99 09/11/2022   CREATININE 0.75 09/11/2022   BUN 15 09/11/2022   CO2 28 09/11/2022   TSH 2.572 06/08/2022   INR 1.0 08/12/2015    DG Chest Portable 1 View  Result Date: 06/07/2022 CLINICAL DATA:  Weakness. EXAM: PORTABLE CHEST 1 VIEW COMPARISON:  Chest x-ray 02/23/2021 FINDINGS: The heart size and mediastinal contours are within normal limits. Both lungs are clear. The visualized skeletal structures are unremarkable. IMPRESSION: No active disease. Electronically Signed   By: Ronney Asters M.D.   On: 06/07/2022 23:43   CT Head Wo Contrast  Result Date: 06/07/2022 CLINICAL DATA:  Dizziness with nausea and vomiting. EXAM: CT HEAD WITHOUT CONTRAST TECHNIQUE: Contiguous axial images were  obtained from the base of the skull through the vertex without intravenous contrast. RADIATION DOSE REDUCTION: This exam was performed according to the departmental dose-optimization program which includes automated exposure control, adjustment of the mA and/or kV according to patient size and/or use of iterative reconstruction technique. COMPARISON:  None Available. FINDINGS: Brain: There is mild  cerebral atrophy with widening of the extra-axial spaces and mild to moderate severity ventricular dilatation. There are areas of decreased attenuation within the white matter tracts of the supratentorial brain, consistent with microvascular disease changes. Vascular: No hyperdense vessel or unexpected calcification. Skull: Normal. Negative for fracture or focal lesion. Sinuses/Orbits: No acute finding. Other: None. 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 Electronically Signed   By: Virgina Norfolk M.D.   On: 06/07/2022 20:37       Assessment & Plan:  Routine general medical examination at a health care facility  Visit for screening mammogram -     3D Screening Mammogram, Left and Right; Future  Health care maintenance Assessment & Plan: Physical today 12/25/22. Mammogram 04/29/22- Birads 1.  PAP 03/22/16 - negative with negative HPV.  Colonoscopy 08/2012.     Angioleiomyoma Assessment & Plan: Dr Lanora Manis - 07/2022 - recommended annual ultrasound.  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 However the radiologist reported that this could be a malignancy. Hence we will follow-up with his sonogram annually to assess the left kidney mass with the beginning of next year. 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 A 2.0 cm left renal cyst or parapelvic cyst is also again noted. Due f/u renal ultrasound.      Aortic atherosclerosis ([place]) Assessment & Plan: Continue crestor.     Dizziness Assessment & Plan: Persistent issue.  Evaluated by ENT.  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  Recommended vestibular rehab and evaluation by neurology.  Neurology evaluation as outlined.  MRI as  outlined.  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 Had PT for core strengthening, balance and gait issues. She was referred to the CSF disorder clinic at [facility] for evaluation of possible normal pressure hydrocephalus versus a neurodegenerative condition. A large-volume lumbar puncture was performed on 02/02/22, however only 52m CSF could be drained. 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 CSF biomarkers were negative for AD. It was recommended to proceed with an extended lumbar drain.  Also recommended a DaTscan - pt had some parkinsonian features (bradykinesia, rigidity, and abnormal EOMs noted on exam). Lumbar puncture was nondiagnostic as could only drain 817mof CSF. SPECT scan was unremarkable as well.  [facility], removed 300 cc of CSF fluid, dizziness improved, balance and memory/confusion did not improve.Did not qualify for shunt.  PET scan -  No specific pattern of hypometabolism to suggest a specific etiology of dementia. Ventriculomegaly. Persistent dizziness and unsteady - wide based gait.  Started on exelon.  Request neurology referral to [facility] given persistent symptoms.   Orders: -     Ambulatory referral to Neurology  Fatty liver Assessment & Plan: Found on ultrasound.  Follow liver function tests.     Hypercholesterolemia Assessment & Plan: Continue crestor.    Primary hypertension Assessment & Plan: Continue  amlodipine, lisinopril ('40mg'$ ) and carvedilol 12.'5mg'$  bid.  Have them spot check her pressures.  Send in readings.  Follow metabolic panel.  Blood pressure better.    Hypothyroidism, unspecified type Assessment & Plan: On thyroid replacement.  Follow tsh.    Left hip pain Assessment & Plan: F/u 11/2022 -- left trochanteric bursitis - s/p injection.    Memory change Assessment & Plan: Has been evaluated at [facility] as  outlined.  Did not qualify for shunt.  Seeing neurology.  Patient completed Lumbar drain trial at [facility] 05/29/2022, removed 300 cc of CSF fluid, dizziness improved, balance and memory/confusion did not improve. Recently started on exelon.  Request referral to [facility] neurology.   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 Did not qualify for shunt placement.  Persistent dizziness. Request referral to [facility] neurology.    Stress Assessment & Plan: Continue zoloft.       Einar Pheasant, MD

## 2022-12-25 NOTE — Assessment & Plan Note (Signed)
Physical today 12/25/22. Mammogram 04/29/22- Birads 1.  PAP 03/22/16 - negative with negative HPV.  Colonoscopy 08/2012.

## 2022-12-30 ENCOUNTER — Encounter: Payer: Self-pay | Admitting: Internal Medicine

## 2022-12-30 NOTE — Assessment & Plan Note (Signed)
Has been evaluated at [facility] as outlined.  Did not qualify for shunt.  Seeing neurology.  Patient completed Lumbar drain trial at [facility] 05/29/2022, removed 300 cc of CSF fluid, dizziness improved, balance and memory/confusion did not improve. Recently started on exelon.  Request referral to [facility] neurology.

## 2022-12-30 NOTE — Assessment & Plan Note (Signed)
Continue crestor 

## 2022-12-30 NOTE — Assessment & Plan Note (Addendum)
Continue amlodipine, lisinopril ('40mg'$ ) and carvedilol 12.'5mg'$  bid.  Have them spot check her pressures.  Send in readings.  Follow metabolic panel.  Blood pressure better.

## 2022-12-30 NOTE — Assessment & Plan Note (Addendum)
Dr Lanora Manis - 07/2022 - recommended annual ultrasound.  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 However the radiologist reported that this could be a malignancy. Hence we will follow-up with his sonogram annually to assess the left kidney mass with the beginning of next year. 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 A 2.0 cm left renal cyst or parapelvic cyst is also again noted. Due f/u renal ultrasound.

## 2022-12-30 NOTE — Assessment & Plan Note (Signed)
Recent history of admission to [facility] for further evaluation of NPH and external lumbar drainage of csf via lumbar spinal catheter. Did not qualify for shunt placement.  Persistent dizziness. Request referral to [facility] neurology.

## 2022-12-30 NOTE — Assessment & Plan Note (Signed)
On thyroid replacement.  Follow tsh.  

## 2022-12-30 NOTE — Assessment & Plan Note (Signed)
F/u 11/2022 -- left trochanteric bursitis - s/p injection.

## 2022-12-30 NOTE — Assessment & Plan Note (Signed)
Continue zoloft 

## 2022-12-30 NOTE — Assessment & Plan Note (Signed)
Found on ultrasound.  Follow liver function tests.   

## 2022-12-30 NOTE — Assessment & Plan Note (Signed)
Persistent issue.  Evaluated by ENT.  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  Recommended vestibular rehab and evaluation by neurology.  Neurology evaluation as outlined.  MRI as outlined.  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 Had PT for core strengthening, balance and gait issues. She was referred to the CSF disorder clinic at [facility] for evaluation of possible normal pressure hydrocephalus versus a neurodegenerative condition. A large-volume lumbar puncture was performed on 02/02/22, however only 81m CSF could be drained. 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 CSF biomarkers were negative for AD. It was recommended to proceed with an extended lumbar drain.  Also recommended a DaTscan - pt had some parkinsonian features (bradykinesia, rigidity, and abnormal EOMs noted on exam). Lumbar puncture was nondiagnostic as could only drain 842mof CSF. SPECT scan was unremarkable as well.  Patient completed Lumbar drain trial at [facility]/04/2022, removed 300 cc of CSF fluid, dizziness improved, balance and memory/confusion did not improve.Did not qualify for shunt.  PET scan -  No specific pattern of hypometabolism to suggest a specific etiology of dementia. Ventriculomegaly. Persistent dizziness and unsteady - wide based gait.  Started on exelon.  Request neurology referral to [facility] given persistent symptoms.

## 2023-01-02 ENCOUNTER — Other Ambulatory Visit: Payer: Self-pay | Admitting: Internal Medicine

## 2023-01-12 ENCOUNTER — Telehealth: Payer: Self-pay | Admitting: *Deleted

## 2023-01-12 DIAGNOSIS — E7800 Pure hypercholesterolemia, unspecified: Secondary | ICD-10-CM

## 2023-01-12 NOTE — Telephone Encounter (Signed)
Please place future orders for lab appt.  

## 2023-01-15 NOTE — Telephone Encounter (Signed)
Lab orders placed.  

## 2023-01-18 ENCOUNTER — Other Ambulatory Visit (INDEPENDENT_AMBULATORY_CARE_PROVIDER_SITE_OTHER): Payer: Medicare Other

## 2023-01-18 DIAGNOSIS — E7800 Pure hypercholesterolemia, unspecified: Secondary | ICD-10-CM | POA: Diagnosis not present

## 2023-01-18 LAB — BASIC METABOLIC PANEL
BUN: 14 mg/dL (ref 6–23)
CO2: 27 mEq/L (ref 19–32)
Calcium: 10.4 mg/dL (ref 8.4–10.5)
Chloride: 105 mEq/L (ref 96–112)
Creatinine, Ser: 0.82 mg/dL (ref 0.40–1.20)
GFR: 66.29 mL/min (ref >60.00)
Glucose, Bld: 94 mg/dL (ref 70–99)
Potassium: 3.9 mEq/L (ref 3.5–5.1)
Sodium: 140 mEq/L (ref 135–145)

## 2023-01-18 LAB — HEPATIC FUNCTION PANEL
ALT: 19 U/L (ref 0–35)
AST: 14 U/L (ref 0–37)
Albumin: 4.5 g/dL (ref 3.5–5.2)
Alkaline Phosphatase: 70 U/L (ref 39–117)
Bilirubin, Direct: 0.2 mg/dL (ref 0.0–0.3)
Total Bilirubin: 1.1 mg/dL (ref 0.2–1.2)
Total Protein: 6.5 g/dL (ref 6.0–8.3)

## 2023-01-18 LAB — LIPID PANEL
Cholesterol: 188 mg/dL (ref 0–200)
HDL: 64.1 mg/dL (ref >39.00)
LDL Cholesterol: 96 mg/dL (ref 0–99)
NonHDL: 123.59
Total CHOL/HDL Ratio: 3
Triglycerides: 136 mg/dL (ref 0.0–149.0)
VLDL: 27.2 mg/dL (ref 0.0–40.0)

## 2023-01-19 ENCOUNTER — Other Ambulatory Visit: Payer: Medicare Other

## 2023-03-14 ENCOUNTER — Ambulatory Visit: Payer: Medicare Other | Admitting: Cardiovascular Disease

## 2023-03-28 ENCOUNTER — Other Ambulatory Visit: Payer: Self-pay | Admitting: Internal Medicine

## 2023-03-28 DIAGNOSIS — E039 Hypothyroidism, unspecified: Secondary | ICD-10-CM

## 2023-03-29 ENCOUNTER — Encounter: Payer: Self-pay | Admitting: Internal Medicine

## 2023-03-29 ENCOUNTER — Ambulatory Visit (INDEPENDENT_AMBULATORY_CARE_PROVIDER_SITE_OTHER): Payer: Medicare Other | Admitting: Internal Medicine

## 2023-03-29 VITALS — BP 138/78 | HR 74 | Temp 97.9°F | Resp 16 | Ht 60.0 in | Wt 130.0 lb

## 2023-03-29 DIAGNOSIS — E871 Hypo-osmolality and hyponatremia: Secondary | ICD-10-CM

## 2023-03-29 DIAGNOSIS — D219 Benign neoplasm of connective and other soft tissue, unspecified: Secondary | ICD-10-CM

## 2023-03-29 DIAGNOSIS — R413 Other amnesia: Secondary | ICD-10-CM

## 2023-03-29 DIAGNOSIS — F439 Reaction to severe stress, unspecified: Secondary | ICD-10-CM

## 2023-03-29 DIAGNOSIS — K760 Fatty (change of) liver, not elsewhere classified: Secondary | ICD-10-CM

## 2023-03-29 DIAGNOSIS — E7800 Pure hypercholesterolemia, unspecified: Secondary | ICD-10-CM

## 2023-03-29 DIAGNOSIS — I700 Atherosclerosis of aorta: Secondary | ICD-10-CM | POA: Diagnosis not present

## 2023-03-29 DIAGNOSIS — I10 Essential (primary) hypertension: Secondary | ICD-10-CM

## 2023-03-29 DIAGNOSIS — R42 Dizziness and giddiness: Secondary | ICD-10-CM

## 2023-03-29 DIAGNOSIS — R2681 Unsteadiness on feet: Secondary | ICD-10-CM

## 2023-03-29 DIAGNOSIS — N289 Disorder of kidney and ureter, unspecified: Secondary | ICD-10-CM

## 2023-03-29 DIAGNOSIS — G912 (Idiopathic) normal pressure hydrocephalus: Secondary | ICD-10-CM

## 2023-03-29 DIAGNOSIS — E039 Hypothyroidism, unspecified: Secondary | ICD-10-CM

## 2023-03-29 MED ORDER — PANTOPRAZOLE SODIUM 40 MG PO TBEC: DELAYED_RELEASE_TABLET | ORAL | 3 refills | Status: DC

## 2023-03-29 MED ORDER — LEVOTHYROXINE SODIUM 75 MCG PO TABS: 75.0000 ug | ORAL_TABLET | Freq: Every day | ORAL | 3 refills | Status: DC

## 2023-03-29 MED ORDER — ROSUVASTATIN CALCIUM 10 MG PO TABS: 10.0000 mg | ORAL_TABLET | Freq: Every day | ORAL | 3 refills | Status: DC

## 2023-03-29 MED ORDER — SERTRALINE HCL 25 MG PO TABS: 25.0000 mg | ORAL_TABLET | Freq: Every day | ORAL | 1 refills | Status: DC

## 2023-03-29 MED ORDER — AMLODIPINE BESYLATE 5 MG PO TABS: 5.0000 mg | ORAL_TABLET | Freq: Every day | ORAL | 3 refills | Status: DC

## 2023-03-29 NOTE — Progress Notes (Signed)
Subjective:    Patient ID: Crystal Lynch, female    DOB: 03-18-1941, 82 y.o.   MRN: [id number]  Patient here for  Chief Complaint  Patient presents with   Medical Management of Chronic Issues    HPI Here to follow up regarding hypercholesterolemia, hypertension, hypothyroidism and persistent issues with dizziness and memory change.  Has seen neurology.  Started on exelon.  Has seen nephrology for evaluation hyponatremia.  Recommended 1 liter fluid restriction.  She states she is monitoring her fluid intake.  No chest pain.  Breathing stable.  No increased cough or congestion.  No abdominal pain or bowel change.  She is still having issues with unsteady gait and dizziness.  Extensive w/up as outlined in previous note.  Has appt with [facility] for further evaluation in July.     Past Medical History:  Diagnosis Date   Arthritis    Chicken pox    Endometriosis    Esophagitis    s/p esophageal dilatation   GERD (gastroesophageal reflux disease)    History of ovarian cyst    Hypercholesterolemia    Hypertension    Hypothyroidism    Past Surgical History:  Procedure Laterality Date   ABDOMINAL SURGERY     found to have an ovarian cyst and endometriosis   APPENDECTOMY     Family History  Problem Relation Age of Onset   Lung disease Father    Heart attack Father    Heart disease Mother        myocardial infarction   Breast cancer Mother 61   Heart attack Mother    Hypertension Mother    Hyperlipidemia Mother    Breast cancer Sister 46   Hypertension Sister    Rheum arthritis Sister    Breast cancer Maternal Aunt 50   Hypertension Brother    Colon cancer Neg Hx    Social History   Socioeconomic History   Marital status: Married    Spouse name: Not on file   Number of children: Not on file   Years of education: Not on file   Highest education level: Associate degree: academic program  Occupational History   Not on file  Tobacco Use   Smoking status: Never    Smokeless tobacco: Never  Vaping Use   Vaping Use: Never used  Substance and Sexual Activity   Alcohol use: No    Alcohol/week: 0.0 standard drinks of alcohol   Drug use: No   Sexual activity: Never  Other Topics Concern   Not on file  Social History Narrative   Not on file   Social Determinants of Health   Financial Resource Strain: Low Risk  (03/28/2023)   Overall Financial Resource Strain (CARDIA)    Difficulty of Paying Living Expenses: Not hard at all  Food Insecurity: No Food Insecurity (03/28/2023)   Hunger Vital Sign    Worried About Running Out of Food in the Last Year: Never true    Ran Out of Food in the Last Year: Never true  Transportation Needs: No Transportation Needs (03/28/2023)   PRAPARE - Administrator, Civil Service (Medical): No    Lack of Transportation (Non-Medical): No  Physical Activity: Insufficiently Active (03/28/2023)   Exercise Vital Sign    Days of Exercise per Week: 5 days    Minutes of Exercise per Session: 10 min  Stress: No Stress Concern Present (03/28/2023)   [employer] of Occupational Health - Occupational Stress Questionnaire  Feeling of Stress : Only a little  Social Connections: Socially Integrated (03/28/2023)   Social Connection and Isolation Panel [NHANES]    Frequency of Communication with Friends and Family: More than three times a week    Frequency of Social Gatherings with Friends and Family: More than three times a week    Attends Religious Services: More than 4 times per year    Active Member of [employer] or Organizations: Yes    Attends Engineer, structural: More than 4 times per year    Marital Status: Married     Review of Systems  Constitutional:  Negative for appetite change and unexpected weight change.  HENT:  Negative for congestion and sinus pressure.   Respiratory:  Negative for cough, chest tightness and shortness of breath.   Cardiovascular:  Negative for chest pain, palpitations and leg  swelling.  Gastrointestinal:  Negative for abdominal pain, diarrhea, nausea and vomiting.  Genitourinary:  Negative for difficulty urinating and dysuria.  Musculoskeletal:  Negative for joint swelling and myalgias.  Skin:  Negative for color change and rash.  Neurological:  Positive for dizziness. Negative for headaches.       Unsteady gait.   Psychiatric/Behavioral:  Negative for agitation and dysphoric mood.        Objective:     BP 138/78   Pulse 74   Temp 97.9 F (36.6 C)   Resp 16   Ht 5' (1.524 m)   Wt 130 lb (59 kg)   LMP 10/21/1984   SpO2 98%   BMI 25.39 kg/m  Wt Readings from Last 3 Encounters:  03/29/23 130 lb (59 kg)  12/25/22 127 lb 6.4 oz (57.8 kg)  11/24/22 131 lb 3.2 oz (59.5 kg)    Physical Exam Vitals reviewed.  Constitutional:      General: She is not in acute distress.    Appearance: Normal appearance.  HENT:     Head: Normocephalic and atraumatic.     Right Ear: External ear normal.     Left Ear: External ear normal.  Eyes:     General: No scleral icterus.       Right eye: No discharge.        Left eye: No discharge.     Conjunctiva/sclera: Conjunctivae normal.  Neck:     Thyroid: No thyromegaly.  Cardiovascular:     Rate and Rhythm: Normal rate and regular rhythm.  Pulmonary:     Effort: No respiratory distress.     Breath sounds: Normal breath sounds. No wheezing.  Abdominal:     General: Bowel sounds are normal.     Palpations: Abdomen is soft.     Tenderness: There is no abdominal tenderness.  Musculoskeletal:        General: No swelling or tenderness.     Cervical back: Neck supple. No tenderness.  Lymphadenopathy:     Cervical: No cervical adenopathy.  Skin:    Findings: No erythema or rash.  Neurological:     Mental Status: She is alert.     Comments: Unsteady gait.   Psychiatric:        Mood and Affect: Mood normal.        Behavior: Behavior normal.      Outpatient Encounter Medications as of 03/29/2023  Medication  Sig   acetaminophen (TYLENOL) 500 MG tablet Take 1 tablet (500 mg total) by mouth every 6 (six) hours as needed for mild pain (or Fever >/= 101).   amLODipine (NORVASC) 5 MG  tablet Take 1 tablet (5 mg total) by mouth daily.   aspirin 81 MG EC tablet Take 1 tablet (81 mg total) by mouth daily. Swallow whole.   Biotin 5 MG CAPS Take 5,000 mcg by mouth daily.   carvedilol (COREG) 12.5 MG tablet Take 1 tablet (12.5 mg total) by mouth 2 (two) times daily with a meal.   cyanocobalamin (VITAMIN B12) 1000 MCG tablet Take 1,000 mcg by mouth daily.   diphenhydrAMINE (BENADRYL) 25 mg capsule Take 25 mg by mouth every 6 (six) hours as needed.   dorzolamide-timolol (COSOPT) 22.3-6.8 MG/ML ophthalmic solution Place 1 drop into both eyes 2 (two) times daily.   levothyroxine (SYNTHROID) 75 MCG tablet Take 1 tablet (75 mcg total) by mouth daily.   lisinopril (ZESTRIL) 40 MG tablet Take 1 tablet (40 mg total) by mouth daily.   Multiple Vitamins-Minerals (ZINC PO) Take 1 tablet by mouth daily.   pantoprazole (PROTONIX) 40 MG tablet TAKE 1 TABLET BY MOUTH EVERY DAY   rivastigmine (EXELON) 1.5 MG capsule Take 1.5 mg by mouth 2 (two) times daily.   rosuvastatin (CRESTOR) 10 MG tablet Take 1 tablet (10 mg total) by mouth daily.   sertraline (ZOLOFT) 25 MG tablet Take 1 tablet (25 mg total) by mouth daily.   Zinc 50 MG TABS Take by mouth.   [DISCONTINUED] amLODipine (NORVASC) 5 MG tablet Take 1 tablet (5 mg total) by mouth daily.   [DISCONTINUED] levothyroxine (SYNTHROID) 75 MCG tablet TAKE 1 TABLET BY MOUTH EVERY DAY   [DISCONTINUED] pantoprazole (PROTONIX) 40 MG tablet TAKE 1 TABLET BY MOUTH EVERY DAY   [DISCONTINUED] rosuvastatin (CRESTOR) 10 MG tablet Take 1 tablet (10 mg total) by mouth daily.   [DISCONTINUED] sertraline (ZOLOFT) 25 MG tablet TAKE 1 TABLET (25 MG TOTAL) BY MOUTH DAILY.   No facility-administered encounter medications on file as of 03/29/2023.     Lab Results  Component Value Date   WBC 8.3  06/08/2022   HGB 13.7 06/08/2022   HCT 38.6 06/08/2022   PLT 243 06/08/2022   GLUCOSE 94 01/18/2023   CHOL 188 01/18/2023   TRIG 136.0 01/18/2023   HDL 64.10 01/18/2023   LDLDIRECT 88.0 03/22/2021   LDLCALC 96 01/18/2023   ALT 19 01/18/2023   AST 14 01/18/2023   NA 140 01/18/2023   K 3.9 01/18/2023   CL 105 01/18/2023   CREATININE 0.82 01/18/2023   BUN 14 01/18/2023   CO2 27 01/18/2023   TSH 2.572 06/08/2022   INR 1.0 08/12/2015    DG Chest Portable 1 View  Result Date: 06/07/2022 CLINICAL DATA:  Weakness. EXAM: PORTABLE CHEST 1 VIEW COMPARISON:  Chest x-ray 02/23/2021 FINDINGS: The heart size and mediastinal contours are within normal limits. Both lungs are clear. The visualized skeletal structures are unremarkable. IMPRESSION: No active disease. Electronically Signed   By: Darliss Cheney M.D.   On: 06/07/2022 23:43   CT Head Wo Contrast  Result Date: 06/07/2022 CLINICAL DATA:  Dizziness with nausea and vomiting. EXAM: CT HEAD WITHOUT CONTRAST TECHNIQUE: Contiguous axial images were obtained from the base of the skull through the vertex without intravenous contrast. RADIATION DOSE REDUCTION: This exam was performed according to the departmental dose-optimization program which includes automated exposure control, adjustment of the mA and/or kV according to patient size and/or use of iterative reconstruction technique. COMPARISON:  None Available. FINDINGS: Brain: There is mild cerebral atrophy with widening of the extra-axial spaces and mild to moderate severity ventricular dilatation. There are areas of decreased attenuation  within the white matter tracts of the supratentorial brain, consistent with microvascular disease changes. Vascular: No hyperdense vessel or unexpected calcification. Skull: Normal. Negative for fracture or focal lesion. Sinuses/Orbits: No acute finding. Other: None. IMPRESSION: Generalized cerebral atrophy and chronic white matter small vessel ischemic changes  without evidence of an acute intracranial abnormality. Electronically Signed   By: Aram Candela M.D.   On: 06/07/2022 20:37       Assessment & Plan:  Hypercholesterolemia Assessment & Plan: Continue crestor. Follow lipid panel and liver function tests.   Orders: -     Lipid panel; Future -     Hepatic function panel; Future -     Basic metabolic panel; Future -     TSH; Future  Hypothyroidism, unspecified type Assessment & Plan: On thyroid replacement.  Follow tsh.   Orders: -     Levothyroxine Sodium; Take 1 tablet (75 mcg total) by mouth daily.  Dispense: 90 tablet; Refill: 3  Angioleiomyoma Assessment & Plan: Dr Suezanne Jacquet - 07/2022 - recommended annual ultrasound.  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 However the radiologist reported that this could be a malignancy. Hence we will follow-up with his sonogram annually to assess the left kidney mass with the beginning of next year. 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 A 2.0 cm left renal cyst or parapelvic cyst is also again noted. Due f/u renal ultrasound.      Aortic atherosclerosis (HCC) Assessment & Plan: Continue crestor.     Dizziness Assessment & Plan: Persistent issue.  Evaluated by ENT.  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  Recommended vestibular rehab and evaluation by neurology.  Neurology evaluation as outlined.  MRI as outlined.  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 Had PT for core strengthening, balance and gait issues. She was referred to the CSF disorder clinic at [facility] for evaluation of possible normal pressure hydrocephalus versus a neurodegenerative condition. A large-volume lumbar puncture was performed on 02/02/22, however only 8ml CSF could be drained. 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 CSF biomarkers were negative for AD. It was recommended to proceed with an extended lumbar drain.  Also recommended a DaTscan - pt had some parkinsonian features (bradykinesia, rigidity, and abnormal EOMs noted on exam). Lumbar puncture was nondiagnostic as could only drain 8mL of CSF. SPECT scan was unremarkable as well.  Patient completed Lumbar drain trial at [facility] 05/29/2022, removed 300 cc of CSF fluid, dizziness improved, balance and memory/confusion did not improve.Did not qualify for shunt.  PET scan -  No specific pattern of hypometabolism to suggest a specific etiology of dementia. Ventriculomegaly. Persistent dizziness and unsteady - wide based gait.  Started on exelon.  Requested neurology referral to [facility] given persistent symptoms. Per pt, has appt scheduled in December.     Fatty liver Assessment & Plan: Found on ultrasound.  Follow liver function tests.     Primary hypertension Assessment & Plan: Continue amlodipine, lisinopril (40mg ) and carvedilol 12.5mg  bid.  Have them spot check her pressures.  Send in readings.  Follow metabolic panel.  Blood pressure better.    Hyponatremia Assessment & Plan: Has seen nephrology for evaluation hyponatremia.  Recommended 1 liter fluid restriction.  She states she is monitoring her fluid intake.  Kidney lesion Assessment & Plan: Seeing nephrology (11/2021).  Recommended yearly renal ultrasound.    Memory change Assessment & Plan: Has been evaluated at [facility] as outlined.  Did not qualify for shunt.  Seeing neurology.  Patient completed Lumbar drain trial at [facility] 05/29/2022, removed 300 cc of CSF fluid, dizziness improved, balance and memory/confusion did not improve. Recently started on exelon.  Has appt [facility] - in July.    NPH (normal pressure hydrocephalus) (HCC) Assessment & Plan: Recent history of admission to  [facility] for further evaluation of NPH and external lumbar drainage of csf via lumbar spinal catheter. Did not qualify for shunt placement.  Persistent dizziness. Has appt - [facility].    Stress Assessment & Plan: Continue zoloft.    Unsteady gait Assessment & Plan:  Evaluated at [facility] (Dr. Idelle Crouch, Dr. Glenice Bow, Dr. Roda Shutters). Documentation reviewed. Their team recommended extended lumbar train trial and a DATscan as patient has some parkinsonian features (bradykinesia, rigidity, and abnormal EOMs noted on exam). Lumbar puncture was nondiagnostic as could only drain 8mL of CSF. SPECT scan was unremarkable as well.  Patient completed Lumbar drain trial at [facility] 05/29/2022, removed 300 cc of CSF fluid, dizziness improved, balance and memory/confusion did not improve. She completed in home PT and continues to exercise on her own. Given persistent issues, has appt with [facility] neurology per pt - next month.    Other orders -     amLODIPine Besylate; Take 1 tablet (5 mg total) by mouth daily.  Dispense: 90 tablet; Refill: 3 -     Pantoprazole Sodium; TAKE 1 TABLET BY MOUTH EVERY DAY  Dispense: 90 tablet; Refill: 3 -     Rosuvastatin Calcium; Take 1 tablet (10 mg total) by mouth daily.  Dispense: 90 tablet; Refill: 3 -     Sertraline HCl; Take 1 tablet (25 mg total) by mouth daily.  Dispense: 90 tablet; Refill: 1     [address], MD

## 2023-03-29 NOTE — Telephone Encounter (Signed)
Please refuse. Will go over medications and send appropriate refills at appt today.

## 2023-03-29 NOTE — Patient Instructions (Signed)
Check medications when you get home.  Let us know if anything is different from our list.    Spot check your blood pressure. Send in readings over the next few weeks.

## 2023-04-01 ENCOUNTER — Telehealth: Payer: Self-pay | Admitting: Internal Medicine

## 2023-04-01 ENCOUNTER — Encounter: Payer: Self-pay | Admitting: Internal Medicine

## 2023-04-01 DIAGNOSIS — N289 Disorder of kidney and ureter, unspecified: Secondary | ICD-10-CM

## 2023-04-01 NOTE — Assessment & Plan Note (Signed)
Recent history of admission to [facility] for further evaluation of NPH and external lumbar drainage of csf via lumbar spinal catheter. Did not qualify for shunt placement.  Persistent dizziness. Has appt - [facility].

## 2023-04-01 NOTE — Assessment & Plan Note (Signed)
On thyroid replacement.  Follow tsh.  

## 2023-04-01 NOTE — Assessment & Plan Note (Signed)
Continue amlodipine, lisinopril (40mg) and carvedilol 12.5mg bid.  Have them spot check her pressures.  Send in readings.  Follow metabolic panel.  Blood pressure better.  

## 2023-04-01 NOTE — Assessment & Plan Note (Signed)
Has seen nephrology for evaluation hyponatremia.  Recommended 1 liter fluid restriction.  She states she is monitoring her fluid intake.

## 2023-04-01 NOTE — Assessment & Plan Note (Signed)
Dr Korrapati - 07/2022 - recommended annual ultrasound.  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 However the radiologist reported that this could be a malignancy. Hence we will follow-up with his sonogram annually to assess the left kidney mass with the beginning of next year. 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 A 2.0 cm left renal cyst or parapelvic cyst is also again noted. Due f/u renal ultrasound.    

## 2023-04-01 NOTE — Assessment & Plan Note (Signed)
Persistent issue.  Evaluated by ENT.  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  Recommended vestibular rehab and evaluation by neurology.  Neurology evaluation as outlined.  MRI as outlined.  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 Had PT for core strengthening, balance and gait issues. She was referred to the CSF disorder clinic at [facility] for evaluation of possible normal pressure hydrocephalus versus a neurodegenerative condition. A large-volume lumbar puncture was performed on 02/02/22, however only 8ml CSF could be drained. 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 CSF biomarkers were negative for AD. It was recommended to proceed with an extended lumbar drain.  Also recommended a DaTscan - pt had some parkinsonian features (bradykinesia, rigidity, and abnormal EOMs noted on exam). Lumbar puncture was nondiagnostic as could only drain 8mL of CSF. SPECT scan was unremarkable as well.  Patient completed Lumbar drain trial at [facility] 05/29/2022, removed 300 cc of CSF fluid, dizziness improved, balance and memory/confusion did not improve.Did not qualify for shunt.  PET scan -  No specific pattern of hypometabolism to suggest a specific etiology of dementia. Ventriculomegaly. Persistent dizziness and unsteady - wide based gait.  Started on exelon.  Requested neurology referral to [facility] given persistent symptoms. Per pt, has appt scheduled in December.

## 2023-04-01 NOTE — Assessment & Plan Note (Signed)
Found on ultrasound.  Follow liver function tests.   

## 2023-04-01 NOTE — Assessment & Plan Note (Addendum)
Continue crestor. Follow lipid panel and liver function tests.   

## 2023-04-01 NOTE — Telephone Encounter (Signed)
Per review of nephrology note, recommended for her to have renal ultrasound yearly.  Last one I have is 11/2021.  Need to know if she has had f/u renal ultrasound or is scheduled.  If not will need to schedule - unless planning to be scheduled with nephrology.

## 2023-04-01 NOTE — Assessment & Plan Note (Signed)
Continue crestor 

## 2023-04-01 NOTE — Assessment & Plan Note (Signed)
Seeing nephrology (11/2021).  Recommended yearly renal ultrasound.  

## 2023-04-01 NOTE — Assessment & Plan Note (Signed)
Has been evaluated at [facility] as outlined.  Did not qualify for shunt.  Seeing neurology.  Patient completed Lumbar drain trial at [facility] 05/29/2022, removed 300 cc of CSF fluid, dizziness improved, balance and memory/confusion did not improve. Recently started on exelon.  Has appt [facility] - in July.

## 2023-04-01 NOTE — Assessment & Plan Note (Signed)
Continue zoloft 

## 2023-04-01 NOTE — Assessment & Plan Note (Signed)
Evaluated at [facility] (Dr. Idelle Crouch, Dr. Glenice Bow, Dr. Roda Shutters). Documentation reviewed. Their team recommended extended lumbar train trial and a DATscan as patient has some parkinsonian features (bradykinesia, rigidity, and abnormal EOMs noted on exam). Lumbar puncture was nondiagnostic as could only drain 8mL of CSF. SPECT scan was unremarkable as well.  Patient completed Lumbar drain trial at [facility] 05/29/2022, removed 300 cc of CSF fluid, dizziness improved, balance and memory/confusion did not improve. She completed in home PT and continues to exercise on her own. Given persistent issues, has appt with [facility] neurology per pt - next month.

## 2023-04-10 ENCOUNTER — Telehealth: Payer: Self-pay | Admitting: Internal Medicine

## 2023-04-10 NOTE — Telephone Encounter (Signed)
I have placed the order for renal ultrasound.  Someone should contact her with appt date and time.

## 2023-04-10 NOTE — Telephone Encounter (Signed)
Pt says she does not recall having any follow up scans this year so if she needs to have something done she is ok to do so. She is not aware of anything planned to be scheduled with nephrology.

## 2023-04-10 NOTE — Telephone Encounter (Signed)
Copied from CRM [id number]. Topic: Medicare AWV >> Apr 10, 2023 10:47 AM Payton Doughty wrote: Reason for CRM: LM 04/10/2023 to schedule AWV   Verlee Rossetti; Care Guide Ambulatory Clinical Support [facility] Direct Dial: [phone]

## 2023-04-10 NOTE — Addendum Note (Signed)
Addended by: Charm Barges on: 04/10/2023 11:31 PM   Modules accepted: Orders

## 2023-04-11 NOTE — Telephone Encounter (Signed)
Patient is aware 

## 2023-04-19 ENCOUNTER — Ambulatory Visit
Admission: RE | Admit: 2023-04-19 | Discharge: 2023-04-19 | Disposition: A | Discharge status: Home / Self Care | Payer: Medicare Other | Source: Ambulatory Visit | Attending: Internal Medicine | Admitting: Internal Medicine

## 2023-04-19 DIAGNOSIS — N289 Disorder of kidney and ureter, unspecified: Secondary | ICD-10-CM | POA: Diagnosis present

## 2023-04-19 DIAGNOSIS — N281 Cyst of kidney, acquired: Secondary | ICD-10-CM | POA: Diagnosis not present

## 2023-04-24 ENCOUNTER — Telehealth: Payer: Self-pay

## 2023-04-24 NOTE — Telephone Encounter (Signed)
Pt returned [place] LPN call. Note below was read to her. Pt stated "Im not understanding anything you're saying", so she will like to f/u with [place].

## 2023-04-24 NOTE — Telephone Encounter (Signed)
-----   Message from Dale Lee, MD sent at 04/20/2023  9:49 PM EDT ----- Please call and notify - her kidney ultrasound reveals a stable kidney lesion.  Per radiology this is felt to be benign.  No other acute abnormalities.

## 2023-04-24 NOTE — Telephone Encounter (Signed)
Pt aware of results 

## 2023-04-27 ENCOUNTER — Other Ambulatory Visit (INDEPENDENT_AMBULATORY_CARE_PROVIDER_SITE_OTHER): Payer: Medicare Other

## 2023-04-27 DIAGNOSIS — E7800 Pure hypercholesterolemia, unspecified: Secondary | ICD-10-CM

## 2023-04-27 LAB — LIPID PANEL
Cholesterol: 169 mg/dL (ref 0–200)
HDL: 53.8 mg/dL (ref >39.00)
LDL Cholesterol: 77 mg/dL (ref 0–99)
NonHDL: 115.3
Total CHOL/HDL Ratio: 3
Triglycerides: 190 mg/dL — ABNORMAL HIGH (ref 0.0–149.0)
VLDL: 38 mg/dL (ref 0.0–40.0)

## 2023-04-27 LAB — HEPATIC FUNCTION PANEL
ALT: 13 U/L (ref 0–35)
AST: 17 U/L (ref 0–37)
Albumin: 4.4 g/dL (ref 3.5–5.2)
Alkaline Phosphatase: 68 U/L (ref 39–117)
Bilirubin, Direct: 0.2 mg/dL (ref 0.0–0.3)
Total Bilirubin: 1.4 mg/dL — ABNORMAL HIGH (ref 0.2–1.2)
Total Protein: 6.8 g/dL (ref 6.0–8.3)

## 2023-04-27 LAB — TSH: TSH: 1.26 u[IU]/mL (ref 0.35–5.50)

## 2023-04-27 LAB — BASIC METABOLIC PANEL
BUN: 15 mg/dL (ref 6–23)
CO2: 26 mEq/L (ref 19–32)
Calcium: 10.6 mg/dL — ABNORMAL HIGH (ref 8.4–10.5)
Chloride: 107 mEq/L (ref 96–112)
Creatinine, Ser: 0.83 mg/dL (ref 0.40–1.20)
GFR: 65.21 mL/min (ref >60.00)
Glucose, Bld: 106 mg/dL — ABNORMAL HIGH (ref 70–99)
Potassium: 4 mEq/L (ref 3.5–5.1)
Sodium: 141 mEq/L (ref 135–145)

## 2023-05-01 ENCOUNTER — Other Ambulatory Visit: Payer: Self-pay

## 2023-05-01 ENCOUNTER — Telehealth: Payer: Self-pay

## 2023-05-01 NOTE — Telephone Encounter (Signed)
Noted  

## 2023-05-01 NOTE — Telephone Encounter (Signed)
Pt returned [place] LPN call. Note below was read to pt. Pt aware and understood. Next lab appt its scheduled for 05/29/23-calcium.

## 2023-05-01 NOTE — Telephone Encounter (Signed)
-----   Message from Dale Packwood, MD sent at 04/28/2023  2:50 PM EDT ----- Notify - sodium and potassium wnl.  Calcium slightly increased (very slight).  Bilirubin slightly increased.  Remainder of liver panel wnl. Cholesterol levels ok.  Thyroid test wnl.  We will need to follow calcium.  Recheck calcium level in 4 weeks.

## 2023-05-09 ENCOUNTER — Ambulatory Visit: Payer: Medicare Other | Admitting: *Deleted

## 2023-05-09 VITALS — Ht 60.0 in | Wt 131.0 lb

## 2023-05-09 DIAGNOSIS — Z0000 Encounter for general adult medical examination without abnormal findings: Secondary | ICD-10-CM

## 2023-05-09 NOTE — Progress Notes (Signed)
Subjective:   Crystal Lynch is a 82 y.o. female who presents for Medicare Annual (Subsequent) preventive examination.  Visit Complete: Virtual  I connected with  Crystal Lynch on 05/09/23 by a audio enabled telemedicine application and verified that I am speaking with the correct person using two identifiers.  Patient Location: Home  Provider Location: Office/Clinic  I discussed the limitations of evaluation and management by telemedicine. The patient expressed understanding and agreed to proceed.  Review of Systems     Cardiac Risk Factors include: advanced age (>4men, >85 women);hypertension;dyslipidemia     Objective:    Today's Vitals   05/09/23 1325 05/09/23 1326  Weight: 131 lb (59.4 kg)   Height: 5' (1.524 m)   PainSc:  2    Body mass index is 25.58 kg/m.     05/09/2023    1:51 PM 06/07/2022    7:56 PM 04/28/2022    9:12 AM 11/24/2021    2:08 PM 03/15/2021   10:06 AM 02/24/2021    1:20 AM 02/23/2021    5:01 PM  Advanced Directives  Does Patient Have a Medical Advance Directive? Yes No Yes Yes Yes Yes Yes  Type of Estate agent of [place];Living will  Healthcare Power of [place];Living will  Healthcare Power of [place];Living will Healthcare Power of [place];Living will Healthcare Power of [place];Living will  Does patient want to make changes to medical advance directive?   No - Patient declined No - Patient declined No - Patient declined No - Patient declined   Copy of Healthcare Power of Attorney in Chart? No - copy requested  No - copy requested  No - copy requested No - copy requested   Would patient like information on creating a medical advance directive?  No - Patient declined         Current Medications (verified) Outpatient Encounter Medications as of 05/09/2023  Medication Sig   acetaminophen (TYLENOL) 500 MG tablet Take 1 tablet (500 mg total) by mouth every 6 (six) hours as needed for mild pain (or Fever >/= 101).    amLODipine (NORVASC) 5 MG tablet Take 1 tablet (5 mg total) by mouth daily.   aspirin 81 MG EC tablet Take 1 tablet (81 mg total) by mouth daily. Swallow whole.   Biotin 5 MG CAPS Take 5,000 mcg by mouth daily.   carvedilol (COREG) 12.5 MG tablet Take 1 tablet (12.5 mg total) by mouth 2 (two) times daily with a meal.   cyanocobalamin (VITAMIN B12) 1000 MCG tablet Take 1,000 mcg by mouth daily.   diphenhydrAMINE (BENADRYL) 25 mg capsule Take 25 mg by mouth every 6 (six) hours as needed.   levothyroxine (SYNTHROID) 75 MCG tablet Take 1 tablet (75 mcg total) by mouth daily.   lisinopril (ZESTRIL) 40 MG tablet Take 1 tablet (40 mg total) by mouth daily.   pantoprazole (PROTONIX) 40 MG tablet TAKE 1 TABLET BY MOUTH EVERY DAY   rosuvastatin (CRESTOR) 10 MG tablet Take 1 tablet (10 mg total) by mouth daily.   sertraline (ZOLOFT) 25 MG tablet Take 1 tablet (25 mg total) by mouth daily.   timolol (TIMOPTIC) 0.5 % ophthalmic solution Place 1 drop into both eyes 2 (two) times daily.   Zinc 50 MG TABS Take by mouth.   dorzolamide-timolol (COSOPT) 22.3-6.8 MG/ML ophthalmic solution Place 1 drop into both eyes 2 (two) times daily. (Patient not taking: Reported on 05/09/2023)   Multiple Vitamins-Minerals (ZINC PO) Take 1 tablet by mouth daily. (Patient not  taking: Reported on 05/09/2023)   rivastigmine (EXELON) 1.5 MG capsule Take 1.5 mg by mouth 2 (two) times daily. (Patient not taking: Reported on 05/09/2023)   No facility-administered encounter medications on file as of 05/09/2023.    Allergies (verified) Meloxicam   History: Past Medical History:  Diagnosis Date   Arthritis    Chicken pox    Endometriosis    Esophagitis    s/p esophageal dilatation   GERD (gastroesophageal reflux disease)    History of ovarian cyst    Hypercholesterolemia    Hypertension    Hypothyroidism    Past Surgical History:  Procedure Laterality Date   ABDOMINAL SURGERY     found to have an ovarian cyst and  endometriosis   APPENDECTOMY     Family History  Problem Relation Age of Onset   Lung disease Father    Heart attack Father    Heart disease Mother        myocardial infarction   Breast cancer Mother 1   Heart attack Mother    Hypertension Mother    Hyperlipidemia Mother    Breast cancer Sister 64   Hypertension Sister    Rheum arthritis Sister    Breast cancer Maternal Aunt 50   Hypertension Brother    Colon cancer Neg Hx    Social History   Socioeconomic History   Marital status: Married    Spouse name: Not on file   Number of children: Not on file   Years of education: Not on file   Highest education level: Associate degree: academic program  Occupational History   Not on file  Tobacco Use   Smoking status: Never   Smokeless tobacco: Never  Vaping Use   Vaping status: Never Used  Substance and Sexual Activity   Alcohol use: No    Alcohol/week: 0.0 standard drinks of alcohol   Drug use: No   Sexual activity: Never  Other Topics Concern   Not on file  Social History Narrative   Married   Social Determinants of Health   Financial Resource Strain: Low Risk  (05/09/2023)   Overall Financial Resource Strain (CARDIA)    Difficulty of Paying Living Expenses: Not hard at all  Food Insecurity: No Food Insecurity (05/09/2023)   Hunger Vital Sign    Worried About Running Out of Food in the Last Year: Never true    Ran Out of Food in the Last Year: Never true  Transportation Needs: No Transportation Needs (05/09/2023)   PRAPARE - Administrator, Civil Service (Medical): No    Lack of Transportation (Non-Medical): No  Physical Activity: Inactive (05/09/2023)   Exercise Vital Sign    Days of Exercise per Week: 0 days    Minutes of Exercise per Session: 0 min  Stress: Stress Concern Present (05/09/2023)   [employer] of Occupational Health - Occupational Stress Questionnaire    Feeling of Stress : To some extent  Social Connections: Socially Integrated  (05/09/2023)   Social Connection and Isolation Panel [NHANES]    Frequency of Communication with Friends and Family: More than three times a week    Frequency of Social Gatherings with Friends and Family: More than three times a week    Attends Religious Services: More than 4 times per year    Active Member of [employer] or Organizations: Yes    Attends Engineer, structural: More than 4 times per year    Marital Status: Married    Tobacco Counseling  Counseling given: Not Answered   Clinical Intake:  Pre-visit preparation completed: Yes  Pain : 0-10 Pain Score: 2  Pain Type: Chronic pain Pain Location:  (arthritis pain) Pain Orientation:  (joint pain from arthritis) Pain Descriptors / Indicators: Aching Pain Onset: More than a month ago Pain Frequency: Intermittent Pain Relieving Factors: Tylenol  Pain Relieving Factors: Tylenol  BMI - recorded: 25.58 Nutritional Status: BMI 25 -29 Overweight Nutritional Risks: None Diabetes: No  How often do you need to have someone help you when you read instructions, pamphlets, or other written materials from your doctor or pharmacy?: 1 - Never  Interpreter Needed?: No  Information entered by :: R. Shekela Goodridge LPN   Activities of Daily Living    05/09/2023    1:33 PM 05/09/2023    1:31 PM  In your present state of health, do you have any difficulty performing the following activities:  Hearing? 0 0  Vision? 0 1  Comment glasses   Difficulty concentrating or making decisions? 1 1  Walking or climbing stairs? 1 1  Comment uses a walker and cane   Dressing or bathing? 0 1  Doing errands, shopping? 1 1  Comment husband Advice worker and eating ? N Y  Using the Toilet? N Y  In the past six months, have you accidently leaked urine? N Y  Do you have problems with loss of bowel control? N Y  Managing your Medications? Y Y  Comment husband does this   Managing your Finances? Y Y  Comment husband does this   Teacher, English as a foreign language? N Y  Comment has someone that comes in every other week     Patient Care Team: Dale Alma, MD as PCP - General (Internal Medicine) Iran Ouch, MD as PCP - Cardiology (Cardiology)  Indicate any recent Medical Services you may have received from other than [facility] providers in the past year (date may be approximate).     Assessment:   This is a routine wellness examination for Crystal Lynch.  Hearing/Vision screen Hearing Screening - Comments:: No isses Vision Screening - Comments:: glasses  Dietary issues and exercise activities discussed:     Goals Addressed             This Visit's Progress    Patient Stated       Wants to work on being able to drive again       Depression Screen    05/09/2023    1:43 PM 11/24/2022    4:18 PM 10/30/2022    4:04 PM 08/29/2022    9:45 AM 07/27/2022    9:10 AM 07/24/2022    9:07 AM 07/04/2022   11:06 AM  PHQ 2/9 Scores  PHQ - 2 Score 0 0 0 0 0 3 0  PHQ- 9 Score 1 1  2  9      Fall Risk    05/09/2023    1:48 PM 11/24/2022    4:18 PM 10/30/2022    4:04 PM 08/29/2022    9:45 AM 07/27/2022    9:09 AM  Fall Risk   Falls in the past year? 1 1 1  0 0  Number falls in past yr: 1 1 1  0   Injury with Fall? 0 0 1 0   Risk for fall due to : History of fall(s);Impaired balance/gait History of fall(s) History of fall(s) Impaired mobility;Impaired balance/gait No Fall Risks  Follow up Falls evaluation completed;Education provided;Falls prevention discussed Falls evaluation completed  Falls evaluation completed  Falls evaluation completed    MEDICARE RISK AT HOME:  Medicare Risk at Home - 05/09/23 1349     Any stairs in or around the home? No    If so, are there any without handrails? No    Home free of loose throw rugs in walkways, pet beds, electrical cords, etc? Yes    Adequate lighting in your home to reduce risk of falls? Yes    Life alert? No    Use of a cane, walker or w/c? Yes    Grab bars in the bathroom?  Yes    Shower chair or bench in shower? Yes    Elevated toilet seat or a handicapped toilet? Yes              Cognitive Function:    03/01/2018    4:50 PM 02/28/2017    4:39 PM  MMSE - Mini Mental State Exam  Orientation to time 5 5  Orientation to Place 5 5  Registration 3 3  Attention/ Calculation 5 5  Recall 3 3  Language- name 2 objects 2 2  Language- repeat 1 1  Language- follow 3 step command 3 3  Language- read & follow direction 1 1  Write a sentence 1 1  Copy design 1 1  Total score 30 30        05/09/2023    1:52 PM 03/15/2021   10:10 AM 03/05/2020   10:04 AM 03/03/2019   11:09 AM  6CIT Screen  What Year? 0 points 0 points 0 points 0 points  What month? 0 points 0 points 0 points 0 points  What time? 0 points 0 points 0 points 0 points  Count back from 20 0 points 0 points  0 points  Months in reverse 0 points 0 points 0 points 0 points  Repeat phrase 0 points  0 points 0 points  Total Score 0 points   0 points    Immunizations Immunization History  Administered Date(s) Administered   Fluad Quad(high Dose 65+) 07/04/2022   Influenza, High Dose Seasonal PF 07/10/2017, 07/01/2018, 07/22/2021   Influenza-Unspecified 06/23/2013, 07/12/2015, 07/04/2016, 06/25/2019, 07/06/2020   PFIZER(Purple Top)SARS-COV-2 Vaccination 10/27/2019, 11/17/2019, 08/24/2020   Pneumococcal Conjugate-13 08/04/2014   Pneumococcal Polysaccharide-23 02/28/2017   Tdap 03/03/2020   Zoster Recombinant(Shingrix) 02/04/2021, 06/15/2021    TDAP status: Up to date  Flu Vaccine status: Up to date  Pneumococcal vaccine status: Up to date  Covid-19 vaccine status: Completed vaccines  Qualifies for Shingles Vaccine? Yes   Zostavax completed No   Shingrix Completed?: Yes  Screening Tests Health Maintenance  Topic Date Due   COVID-19 Vaccine (4 - 2023-24 season) 06/23/2022   Medicare Annual Wellness (AWV)  04/29/2023   MAMMOGRAM  05/20/2023   INFLUENZA VACCINE  05/24/2023    DTaP/Tdap/Td (2 - Td or Tdap) 03/03/2030   Pneumonia Vaccine 63+ Years old  Completed   DEXA SCAN  Completed   Zoster Vaccines- Shingrix  Completed   HPV VACCINES  Aged Out    Health Maintenance  Health Maintenance Due  Topic Date Due   COVID-19 Vaccine (4 - 2023-24 season) 06/23/2022   Medicare Annual Wellness (AWV)  04/29/2023    Colorectal cancer screening: No longer required.   Mammogram status: Completed 05/19/22. Repeat every year scheduled for 05/22/23  Bone Density status: Completed 6/22. Results reflect: Bone density results: OSTEOPENIA. Repeat every 2 years.  Lung Cancer Screening: (Low Dose CT Chest recommended if Age 16-80 years,  20 pack-year currently smoking OR have quit w/in 15years.) does not qualify.     Additional Screening:  Hepatitis C Screening: does not qualify; Completed NA age  Vision Screening: Recommended annual ophthalmology exams for early detection of glaucoma and other disorders of the eye. Is the patient up to date with their annual eye exam?  Yes  Who is the provider or what is the name of the office in which the patient attends annual eye exams? [facility]  If pt is not established with a provider, would they like to be referred to a provider to establish care? No .   Dental Screening: Recommended annual dental exams for proper oral hygiene   Community Resource Referral / Chronic Care Management: CRR required this visit?  No   CCM required this visit?  No     Plan:     I have personally reviewed and noted the following in the patient's chart:   Medical and social history Use of alcohol, tobacco or illicit drugs  Current medications and supplements including opioid prescriptions. Patient is not currently taking opioid prescriptions. Functional ability and status Nutritional status Physical activity Advanced directives List of other physicians Hospitalizations, surgeries, and ER visits in previous 12 months Vitals Screenings to  include cognitive, depression, and falls Referrals and appointments  In addition, I have reviewed and discussed with patient certain preventive protocols, quality metrics, and best practice recommendations. A written personalized care plan for preventive services as well as general preventive health recommendations were provided to patient.     Sydell Axon, LPN   1/61/0960   After Visit Summary: (MyChart) Due to this being a telephonic visit, the after visit summary with patients personalized plan was offered to patient via MyChart   Nurse Notes: None

## 2023-05-09 NOTE — Patient Instructions (Signed)
Per patient no change in vitals since last visit, unable to obtain new vitals due to telehealth visit  Crystal Lynch , Thank you for taking time to come for your Medicare Wellness Visit. I appreciate your ongoing commitment to your health goals. Please review the following plan we discussed and let me know if I can assist you in the future.   These are the goals we discussed:  Goals       continue balance exercises (pt-stated)      Core/strengthening exercises Stretching      Patient Stated      Wants to work on being able to drive again        This is a list of the screening recommended for you and due dates:  Health Maintenance  Topic Date Due   COVID-19 Vaccine (4 - 2023-24 season) 06/23/2022   Mammogram  05/20/2023   Flu Shot  05/24/2023   Medicare Annual Wellness Visit  05/08/2024   DTaP/Tdap/Td vaccine (2 - Td or Tdap) 03/03/2030   Pneumonia Vaccine  Completed   DEXA scan (bone density measurement)  Completed   Zoster (Shingles) Vaccine  Completed   HPV Vaccine  Aged Out    Advanced directives: Will work on  Conditions/risks identified:none  Next appointment: Follow up in one year for your annual wellness visit Patient declined will schedule the next time she is at the office.   Preventive Care 85 Years and Older, Female Preventive care refers to lifestyle choices and visits with your health care provider that can promote health and wellness. What does preventive care include? A yearly physical exam. This is also called an annual well check. Dental exams once or twice a year. Routine eye exams. Ask your health care provider how often you should have your eyes checked. Personal lifestyle choices, including: Daily care of your teeth and gums. Regular physical activity. Eating a healthy diet. Avoiding tobacco and drug use. Limiting alcohol use. Practicing safe sex. Taking low-dose aspirin every day. Taking vitamin and mineral supplements as recommended by your  health care provider. What happens during an annual well check? The services and screenings done by your health care provider during your annual well check will depend on your age, overall health, lifestyle risk factors, and family history of disease. Counseling  Your health care provider may ask you questions about your: Alcohol use. Tobacco use. Drug use. Emotional well-being. Home and relationship well-being. Sexual activity. Eating habits. History of falls. Memory and ability to understand (cognition). Work and work Astronomer. Reproductive health. Screening  You may have the following tests or measurements: Height, weight, and BMI. Blood pressure. Lipid and cholesterol levels. These may be checked every 5 years, or more frequently if you are over 61 years old. Skin check. Lung cancer screening. You may have this screening every year starting at age 60 if you have a 30-pack-year history of smoking and currently smoke or have quit within the past 15 years. Fecal occult blood test (FOBT) of the stool. You may have this test every year starting at age 17. Flexible sigmoidoscopy or colonoscopy. You may have a sigmoidoscopy every 5 years or a colonoscopy every 10 years starting at age 20. Hepatitis C blood test. Hepatitis B blood test. Sexually transmitted disease (STD) testing. Diabetes screening. This is done by checking your blood sugar (glucose) after you have not eaten for a while (fasting). You may have this done every 1-3 years. Bone density scan. This is done to screen for osteoporosis.  You may have this done starting at age 42. Mammogram. This may be done every 1-2 years. Talk to your health care provider about how often you should have regular mammograms. Talk with your health care provider about your test results, treatment options, and if necessary, the need for more tests. Vaccines  Your health care provider may recommend certain vaccines, such as: Influenza vaccine.  This is recommended every year. Tetanus, diphtheria, and acellular pertussis (Tdap, Td) vaccine. You may need a Td booster every 10 years. Zoster vaccine. You may need this after age 25. Pneumococcal 13-valent conjugate (PCV13) vaccine. One dose is recommended after age 54. Pneumococcal polysaccharide (PPSV23) vaccine. One dose is recommended after age 23. Talk to your health care provider about which screenings and vaccines you need and how often you need them. This information is not intended to replace advice given to you by your health care provider. Make sure you discuss any questions you have with your health care provider. Document Released: 11/05/2015 Document Revised: 06/28/2016 Document Reviewed: 08/10/2015 Elsevier Interactive Patient Education  2017 [employer].  Fall Prevention in the Home Falls can cause injuries. They can happen to people of all ages. There are many things you can do to make your home safe and to help prevent falls. What can I do on the outside of my home? Regularly fix the edges of walkways and driveways and fix any cracks. Remove anything that might make you trip as you walk through a door, such as a raised step or threshold. Trim any bushes or trees on the path to your home. Use bright outdoor lighting. Clear any walking paths of anything that might make someone trip, such as rocks or tools. Regularly check to see if handrails are loose or broken. Make sure that both sides of any steps have handrails. Any raised decks and porches should have guardrails on the edges. Have any leaves, snow, or ice cleared regularly. Use sand or salt on walking paths during winter. Clean up any spills in your garage right away. This includes oil or grease spills. What can I do in the bathroom? Use night lights. Install grab bars by the toilet and in the tub and shower. Do not use towel bars as grab bars. Use non-skid mats or decals in the tub or shower. If you need to sit  down in the shower, use a plastic, non-slip stool. Keep the floor dry. Clean up any water that spills on the floor as soon as it happens. Remove soap buildup in the tub or shower regularly. Attach bath mats securely with double-sided non-slip rug tape. Do not have throw rugs and other things on the floor that can make you trip. What can I do in the bedroom? Use night lights. Make sure that you have a light by your bed that is easy to reach. Do not use any sheets or blankets that are too big for your bed. They should not hang down onto the floor. Have a firm chair that has side arms. You can use this for support while you get dressed. Do not have throw rugs and other things on the floor that can make you trip. What can I do in the kitchen? Clean up any spills right away. Avoid walking on wet floors. Keep items that you use a lot in easy-to-reach places. If you need to reach something above you, use a strong step stool that has a grab bar. Keep electrical cords out of the way. Do not use  floor polish or wax that makes floors slippery. If you must use wax, use non-skid floor wax. Do not have throw rugs and other things on the floor that can make you trip. What can I do with my stairs? Do not leave any items on the stairs. Make sure that there are handrails on both sides of the stairs and use them. Fix handrails that are broken or loose. Make sure that handrails are as long as the stairways. Check any carpeting to make sure that it is firmly attached to the stairs. Fix any carpet that is loose or worn. Avoid having throw rugs at the top or bottom of the stairs. If you do have throw rugs, attach them to the floor with carpet tape. Make sure that you have a light switch at the top of the stairs and the bottom of the stairs. If you do not have them, ask someone to add them for you. What else can I do to help prevent falls? Wear shoes that: Do not have high heels. Have rubber bottoms. Are  comfortable and fit you well. Are closed at the toe. Do not wear sandals. If you use a stepladder: Make sure that it is fully opened. Do not climb a closed stepladder. Make sure that both sides of the stepladder are locked into place. Ask someone to hold it for you, if possible. Clearly mark and make sure that you can see: Any grab bars or handrails. First and last steps. Where the edge of each step is. Use tools that help you move around (mobility aids) if they are needed. These include: Canes. Walkers. Scooters. Crutches. Turn on the lights when you go into a dark area. Replace any light bulbs as soon as they burn out. Set up your furniture so you have a clear path. Avoid moving your furniture around. If any of your floors are uneven, fix them. If there are any pets around you, be aware of where they are. Review your medicines with your doctor. Some medicines can make you feel dizzy. This can increase your chance of falling. Ask your doctor what other things that you can do to help prevent falls. This information is not intended to replace advice given to you by your health care provider. Make sure you discuss any questions you have with your health care provider. Document Released: 08/05/2009 Document Revised: 03/16/2016 Document Reviewed: 11/13/2014 Elsevier Interactive Patient Education  2017 [employer].

## 2023-05-22 ENCOUNTER — Ambulatory Visit
Admission: RE | Admit: 2023-05-22 | Discharge: 2023-05-22 | Disposition: A | Discharge status: Home / Self Care | Payer: Medicare Other | Source: Ambulatory Visit | Attending: Internal Medicine | Admitting: Internal Medicine

## 2023-05-22 DIAGNOSIS — Z1231 Encounter for screening mammogram for malignant neoplasm of breast: Secondary | ICD-10-CM | POA: Insufficient documentation

## 2023-05-23 ENCOUNTER — Ambulatory Visit: Payer: Medicare Other | Attending: Cardiovascular Disease | Admitting: Cardiovascular Disease

## 2023-05-23 VITALS — BP 128/74 | HR 63 | Ht 62.0 in | Wt 135.5 lb

## 2023-05-23 DIAGNOSIS — I10 Essential (primary) hypertension: Secondary | ICD-10-CM

## 2023-05-23 DIAGNOSIS — E785 Hyperlipidemia, unspecified: Secondary | ICD-10-CM

## 2023-05-23 NOTE — Patient Instructions (Signed)
Medication Instructions:  No changes *If you need a refill on your cardiac medications before your next appointment, please call your pharmacy*   Lab Work: None ordered If you have labs (blood work) drawn today and your tests are completely normal, you will receive your results only by: MyChart Message (if you have MyChart) OR A paper copy in the mail If you have any lab test that is abnormal or we need to change your treatment, we will call you to review the results.   Testing/Procedures: None ordered   Follow-Up: At [facility], you and your health needs are our priority.  As part of our continuing mission to provide you with exceptional heart care, we have created designated Provider Care Teams.  These Care Teams include your primary Cardiologist (physician) and Advanced Practice Providers (APPs -  Physician Assistants and Nurse Practitioners) who all work together to provide you with the care you need, when you need it.  We recommend signing up for the patient portal called "MyChart".  Sign up information is provided on this After Visit Summary.  MyChart is used to connect with patients for Virtual Visits (Telemedicine).  Patients are able to view lab/test results, encounter notes, upcoming appointments, etc.  Non-urgent messages can be sent to your provider as well.   To learn more about what you can do with MyChart, go to [email or web address].    Your next appointment:   12 month(s)  Provider:   You may see Muhammad Arida, MD or one of the following Advanced Practice Providers on your designated Care Team:   Christopher Berge, NP Ryan Dunn, PA-C Cadence Furth, PA-C Sheri Hammock, NP    

## 2023-05-23 NOTE — Progress Notes (Signed)
Cardiology Office Note   Date:  05/23/2023   ID:  Crystal Lynch, Crystal Lynch Jul 18, 1941, MRN [id number]  PCP:  Dale Guthrie, MD  Cardiologist:   Lorine Bears, MD   Chief Complaint  Patient presents with   Follow-up    6 month f/u no complaints today. Meds reviewed verbally with pt.      History of Present Illness: Crystal Lynch is a 82 y.o. female who presents for  a follow-up visit regarding refractory hypertension. She has no previous cardiac history. Other medical problems include hyperlipidemia and GERD.  Renal artery angiography in October 2016 showed no significant renal artery stenosis.  She has normal pressure hydrocephalus and was seen for this at [facility] and [facility]. She had therapeutic lumbar puncture in August, 2023 but had minimal improvement in symptoms and thus no shunt was placed.  Shortly after that, she was hospitalized at [facility] with severe hyponatremia at 116.  She was given 3% saline with improvement.  Spironolactone was discontinued and she was placed on amlodipine.  I saw her 6 months ago and her blood pressure was elevated.  I increased lisinopril.  She has been doing well with no chest pain, shortness of breath or palpitations.  She continues to have issues with balance.  She does report mild lower extremity edema since she was switched from spironolactone to amlodipine.    Past Medical History:  Diagnosis Date   Arthritis    Chicken pox    Endometriosis    Esophagitis    s/p esophageal dilatation   GERD (gastroesophageal reflux disease)    History of ovarian cyst    Hypercholesterolemia    Hypertension    Hypothyroidism     Past Surgical History:  Procedure Laterality Date   ABDOMINAL SURGERY     found to have an ovarian cyst and endometriosis   APPENDECTOMY       Current Outpatient Medications  Medication Sig Dispense Refill   acetaminophen (TYLENOL) 500 MG tablet Take 1 tablet (500 mg total) by mouth every 6 (six) hours as needed  for mild pain (or Fever >/= 101). 30 tablet 0   amLODipine (NORVASC) 5 MG tablet Take 1 tablet (5 mg total) by mouth daily. 90 tablet 3   aspirin 81 MG EC tablet Take 1 tablet (81 mg total) by mouth daily. Swallow whole. 30 tablet 0   Biotin 5 MG CAPS Take 5,000 mcg by mouth daily.     carvedilol (COREG) 12.5 MG tablet Take 1 tablet (12.5 mg total) by mouth 2 (two) times daily with a meal. 180 tablet 3   cyanocobalamin (VITAMIN B12) 1000 MCG tablet Take 1,000 mcg by mouth daily.     diphenhydrAMINE (BENADRYL) 25 mg capsule Take 25 mg by mouth every 6 (six) hours as needed.     dorzolamide-timolol (COSOPT) 22.3-6.8 MG/ML ophthalmic solution Place 1 drop into both eyes 2 (two) times daily.     levothyroxine (SYNTHROID) 75 MCG tablet Take 1 tablet (75 mcg total) by mouth daily. 90 tablet 3   lisinopril (ZESTRIL) 40 MG tablet Take 1 tablet (40 mg total) by mouth daily. 90 tablet 3   Multiple Vitamins-Minerals (ZINC PO) Take 1 tablet by mouth daily.     pantoprazole (PROTONIX) 40 MG tablet TAKE 1 TABLET BY MOUTH EVERY DAY 90 tablet 3   rivastigmine (EXELON) 1.5 MG capsule Take 1.5 mg by mouth 2 (two) times daily.     rosuvastatin (CRESTOR) 10 MG tablet Take 1 tablet (  10 mg total) by mouth daily. 90 tablet 3   sertraline (ZOLOFT) 25 MG tablet Take 1 tablet (25 mg total) by mouth daily. 90 tablet 1   timolol (TIMOPTIC) 0.5 % ophthalmic solution Place 1 drop into both eyes 2 (two) times daily.     Zinc 50 MG TABS Take by mouth.     No current facility-administered medications for this visit.    Allergies:   Meloxicam    Social History:  The patient  reports that she has never smoked. She has never used smokeless tobacco. She reports that she does not drink alcohol and does not use drugs.   Family History:  The patient's family history includes Breast cancer (age of onset: 41) in her maternal aunt; Breast cancer (age of onset: 6) in her sister; Breast cancer (age of onset: 55) in her mother; Heart  attack in her father and mother; Heart disease in her mother; Hyperlipidemia in her mother; Hypertension in her brother, mother, and sister; Lung disease in her father; Rheum arthritis in her sister.    ROS:  Please see the history of present illness.   Otherwise, review of systems are positive for none.   All other systems are reviewed and negative.    PHYSICAL EXAM: VS:  BP 128/74 (BP Location: Left Arm, Patient Position: Sitting, Cuff Size: Normal)   Pulse 63   Ht 5\' 2"  (1.575 m)   Wt 135 lb 8 oz (61.5 kg)   LMP 10/21/1984   SpO2 98%   BMI 24.78 kg/m  , BMI Body mass index is 24.78 kg/m. GEN: Well nourished, well developed, in no acute distress  HEENT: normal  Neck: no JVD, carotid bruits, or masses Cardiac: RRR; no murmurs, rubs, or gallops, mild bilateral lower extremity edema Respiratory:  clear to auscultation bilaterally, normal work of breathing GI: soft, nontender, nondistended, + BS MS: no deformity or atrophy  Skin: warm and dry, no rash Neuro:  Strength and sensation are intact Psych: euthymic mood, full affect   EKG:  EKG is ordered today. The ekg ordered today demonstrates : Normal sinus rhythm Inferior infarct , age undetermined When compared with ECG of 07-Jun-2022 19:54, T wave amplitude has decreased in Inferior leads    Recent Labs: 06/08/2022: Hemoglobin 13.7; Magnesium 1.9; Platelets 243 04/27/2023: ALT 13; BUN 15; Creatinine, Ser 0.83; Potassium 4.0; Sodium 141; TSH 1.26    Lipid Panel    Component Value Date/Time   CHOL 169 04/27/2023 0745   TRIG 190.0 (H) 04/27/2023 0745   HDL 53.80 04/27/2023 0745   CHOLHDL 3 04/27/2023 0745   VLDL 38.0 04/27/2023 0745   LDLCALC 77 04/27/2023 0745   LDLDIRECT 88.0 03/22/2021 1401      Wt Readings from Last 3 Encounters:  05/23/23 135 lb 8 oz (61.5 kg)  05/09/23 131 lb (59.4 kg)  03/29/23 130 lb (59 kg)           No data to display            ASSESSMENT AND PLAN:   1. Essential  hypertension: Blood pressure is well-controlled on carvedilol, amlodipine and lisinopril.  Mild lower extremity edema as expected and likely due to amlodipine.  Continue same medications for now.  2. Hyperlipidemia: She is currently on rosuvastatin 10 mg once daily.  Most recent LDL was 77.  3.  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   Disposition:   FU with me in 12 months  Signed,  Lorine Bears, MD  05/23/2023  4:02 PM    [facility]

## 2023-05-26 ENCOUNTER — Emergency Department: Payer: Medicare Other

## 2023-05-26 ENCOUNTER — Other Ambulatory Visit: Payer: Self-pay

## 2023-05-26 ENCOUNTER — Emergency Department
Admission: EM | Admit: 2023-05-26 | Discharge: 2023-05-26 | Disposition: A | Discharge status: Home / Self Care | Payer: Medicare Other | Attending: Student in an Organized Health Care Education/Training Program | Admitting: Student in an Organized Health Care Education/Training Program

## 2023-05-26 DIAGNOSIS — R1110 Vomiting, unspecified: Secondary | ICD-10-CM | POA: Diagnosis not present

## 2023-05-26 DIAGNOSIS — R55 Syncope and collapse: Secondary | ICD-10-CM | POA: Diagnosis not present

## 2023-05-26 DIAGNOSIS — E860 Dehydration: Secondary | ICD-10-CM | POA: Diagnosis not present

## 2023-05-26 LAB — BASIC METABOLIC PANEL
Anion gap: 8 (ref 5–15)
BUN: 17 mg/dL (ref 8–23)
CO2: 20 mmol/L — ABNORMAL LOW (ref 22–32)
Calcium: 9.5 mg/dL (ref 8.9–10.3)
Chloride: 109 mmol/L (ref 98–111)
Creatinine, Ser: 0.75 mg/dL (ref 0.44–1.00)
GFR, Estimated: >60 mL/min (ref >60)
Glucose, Bld: 116 mg/dL — ABNORMAL HIGH (ref 70–99)
Potassium: 4 mmol/L (ref 3.5–5.1)
Sodium: 137 mmol/L (ref 135–145)

## 2023-05-26 LAB — TROPONIN I (HIGH SENSITIVITY)
Troponin I (High Sensitivity): 4 ng/L (ref <18)
Troponin I (High Sensitivity): 5 ng/L (ref <18)

## 2023-05-26 LAB — CBC
HCT: 40.9 % (ref 36.0–46.0)
Hemoglobin: 14.2 g/dL (ref 12.0–15.0)
MCH: 31.3 pg (ref 26.0–34.0)
MCHC: 34.7 g/dL (ref 30.0–36.0)
MCV: 90.3 fL (ref 80.0–100.0)
Platelets: 215 10*3/uL (ref 150–400)
RBC: 4.53 MIL/uL (ref 3.87–5.11)
RDW: 12.3 % (ref 11.5–15.5)
WBC: 8.4 10*3/uL (ref 4.0–10.5)
nRBC: 0 % (ref 0.0–0.2)

## 2023-05-26 LAB — CBG MONITORING, ED: Glucose-Capillary: 110 mg/dL — ABNORMAL HIGH (ref 70–99)

## 2023-05-26 MED ORDER — SODIUM CHLORIDE 0.9 % IV BOLUS
500.0000 mL | Freq: Once | INTRAVENOUS | Status: AC
  Administered 2023-05-26: 500 mL via INTRAVENOUS

## 2023-05-26 NOTE — ED Provider Notes (Signed)
[facility] Provider Note    Event Date/Time   First MD Initiated Contact with Patient 05/26/23 1119     (approximate)   History   Loss of Consciousness   HPI  Crystal Lynch is a 82 y.o. female with a history of NPH presents to the ER for evaluation of syncopal episode that occurred this morning briefly after she was eating at [employer] [place].  States that she took her morning medications on an empty stomach which is unusual for her she went to breakfast with friends.  When she was getting up after breakfast she started feel lightheaded like she was about to pass out and then had several episodes of vomiting.  Did lose consciousness and was helped to the ground.  Husband states that her color went pale for roughly 1 minute but no apnea no cyanosis.  Patient states that she feels fatigued but otherwise feels back to her baseline denies any discomfort.     Physical Exam   Triage Vital Signs: ED Triage Vitals  Encounter Vitals Group     BP 05/26/23 1101 (!) 119/53     Systolic BP Percentile --      Diastolic BP Percentile --      Pulse Rate 05/26/23 1101 60     Resp 05/26/23 1101 17     Temp 05/26/23 1101 97.7 F (36.5 C)     Temp Source 05/26/23 1101 Oral     SpO2 05/26/23 1101 93 %     Weight 05/26/23 1105 130 lb (59 kg)     Height 05/26/23 1105 5\' 2"  (1.575 m)     Head Circumference --      Peak Flow --      Pain Score 05/26/23 1105 0     Pain Loc --      Pain Education --      Exclude from Growth Chart --     Most recent vital signs: Vitals:   05/26/23 1400 05/26/23 1430  BP: (!) 151/99 132/61  Pulse: 66 63  Resp: 20 15  Temp:    SpO2: 97% 96%     Constitutional: Alert  Eyes: Conjunctivae are normal.  Head: Atraumatic. Nose: No congestion/rhinnorhea. Mouth/Throat: Mucous membranes are moist.   Neck: Painless ROM.  Cardiovascular:   Good peripheral circulation. Respiratory: Normal respiratory effort.  No retractions.   Gastrointestinal: Soft and nontender.  Musculoskeletal:  no deformity Neurologic:  MAE spontaneously. No gross focal neurologic deficits are appreciated.  Skin:  Skin is warm, dry and intact. No rash noted. Psychiatric: Mood and affect are normal. Speech and behavior are normal.    ED Results / Procedures / Treatments   Labs (all labs ordered are listed, but only abnormal results are displayed) Labs Reviewed  BASIC METABOLIC PANEL - Abnormal; Notable for the following components:      Result Value   CO2 20 (*)    Glucose, Bld 116 (*)    All other components within normal limits  CBG MONITORING, ED - Abnormal; Notable for the following components:   Glucose-Capillary 110 (*)    All other components within normal limits  CBC  URINALYSIS, ROUTINE W REFLEX MICROSCOPIC  TROPONIN I (HIGH SENSITIVITY)  TROPONIN I (HIGH SENSITIVITY)     EKG  ED ECG REPORT I, Willy Eddy, the attending physician, personally viewed and interpreted this ECG.   Date: 05/26/2023  EKG Time: 11:21  Rate: 60  Rhythm: sinus  Axis: normal  Intervals: normal  ST&T Change: no stemi, no depressions    RADIOLOGY Please see ED Course for my review and interpretation.  I personally reviewed all radiographic images ordered to evaluate for the above acute complaints and reviewed radiology reports and findings.  These findings were personally discussed with the patient.  Please see medical record for radiology report.    PROCEDURES:  Critical Care performed: No  Procedures   MEDICATIONS ORDERED IN ED: Medications  sodium chloride 0.9 % bolus 500 mL (0 mLs Intravenous Stopped 05/26/23 1333)     IMPRESSION / MDM / ASSESSMENT AND PLAN / ED COURSE  I reviewed the triage vital signs and the nursing notes.                              Differential diagnosis includes, but is not limited to, dehydration, electrolyte abnormality, seizure, dysrhythmia, ACS, anemia, orthostasis  Patient  presenting to the ER for evaluation of symptoms as described above.  Based on symptoms, risk factors and considered above differential, this presenting complaint could reflect a potentially life-threatening illness therefore the patient will be placed on continuous pulse oximetry and telemetry for monitoring.  Laboratory evaluation will be sent to evaluate for the above complaints.      Clinical Course as of 05/26/23 1448  Sat May 26, 2023  1356 Patient not orthostatic after IV fluids.  CT imaging with evidence of NPH which is a known diagnosis for the patient.  Initial troponin is negative. [PR]  1441 Patient reassessed.  And feels significantly improved after IV hydration.  She is not orthostatic on recheck.  Given her age and risk factors we discussed option for hospitalization for further evaluation and monitoring of her syncopal episode today.  Patient states that she feels well and I agree that she more likely was dehydrated and orthostatic at the time.  Does not seem consistent with CNS etiology or true cardiac etiology.  Outpatient follow-up does seem reasonable.  They live close to the hospital and are reliable.  We discussed return precautions. [PR]    Clinical Course User Index [PR] Willy Eddy, MD     FINAL CLINICAL IMPRESSION(S) / ED DIAGNOSES   Final diagnoses:  Syncope and collapse  Dehydration     Rx / DC Orders   ED Discharge Orders     None        Note:  This document was prepared using Dragon voice recognition software and may include unintentional dictation errors.    Willy Eddy, MD 05/26/23 [phone]

## 2023-05-26 NOTE — ED Triage Notes (Signed)
First nurse note: Pt to ED [facility] from [place] for syncope this am with emesis x2.  Chronic dizziness.  4mg  zofran PTA IV

## 2023-05-26 NOTE — ED Notes (Signed)
Delay in triage: pt using restroom

## 2023-05-26 NOTE — ED Triage Notes (Signed)
Pt presents to ED via [facility] with c/o of syncopal episode today after breakfast. Pt states she stood up and then passed out. Syncopal episode witnessed by husband. Husband states was "passed out for 1 minute". Husband states pt vomited while having syncopal episode and states vomiting twice after waking up.

## 2023-05-29 ENCOUNTER — Other Ambulatory Visit: Payer: Medicare Other

## 2023-05-29 ENCOUNTER — Other Ambulatory Visit (INDEPENDENT_AMBULATORY_CARE_PROVIDER_SITE_OTHER): Payer: Medicare Other

## 2023-05-29 LAB — CALCIUM: Calcium: 10.1 mg/dL (ref 8.4–10.5)

## 2023-06-07 ENCOUNTER — Encounter (INDEPENDENT_AMBULATORY_CARE_PROVIDER_SITE_OTHER): Payer: Self-pay

## 2023-06-29 ENCOUNTER — Encounter: Payer: Self-pay | Admitting: Internal Medicine

## 2023-06-29 ENCOUNTER — Ambulatory Visit (INDEPENDENT_AMBULATORY_CARE_PROVIDER_SITE_OTHER): Payer: Medicare Other | Admitting: Internal Medicine

## 2023-06-29 VITALS — BP 132/72 | HR 61 | Temp 97.5°F | Ht 62.0 in | Wt 133.6 lb

## 2023-06-29 DIAGNOSIS — R2681 Unsteadiness on feet: Secondary | ICD-10-CM | POA: Diagnosis not present

## 2023-06-29 DIAGNOSIS — N289 Disorder of kidney and ureter, unspecified: Secondary | ICD-10-CM

## 2023-06-29 DIAGNOSIS — R413 Other amnesia: Secondary | ICD-10-CM

## 2023-06-29 DIAGNOSIS — I700 Atherosclerosis of aorta: Secondary | ICD-10-CM

## 2023-06-29 DIAGNOSIS — G912 (Idiopathic) normal pressure hydrocephalus: Secondary | ICD-10-CM | POA: Diagnosis not present

## 2023-06-29 DIAGNOSIS — E7800 Pure hypercholesterolemia, unspecified: Secondary | ICD-10-CM

## 2023-06-29 DIAGNOSIS — E039 Hypothyroidism, unspecified: Secondary | ICD-10-CM

## 2023-06-29 DIAGNOSIS — D219 Benign neoplasm of connective and other soft tissue, unspecified: Secondary | ICD-10-CM

## 2023-06-29 DIAGNOSIS — F439 Reaction to severe stress, unspecified: Secondary | ICD-10-CM

## 2023-06-29 DIAGNOSIS — I10 Essential (primary) hypertension: Secondary | ICD-10-CM

## 2023-06-29 DIAGNOSIS — K760 Fatty (change of) liver, not elsewhere classified: Secondary | ICD-10-CM

## 2023-06-29 NOTE — Progress Notes (Unsigned)
Subjective:    Patient ID: Crystal Lynch, female    DOB: 31-May-1941, 82 y.o.   MRN: [id number]  Patient here for  Chief Complaint  Patient presents with   Medication Management    HPI Here to follow up regarding hypercholesterolemia, hypertension, hypothyroidism and persistent issues with dizziness and memory change. Has seen neurology. Started on exelon. Has seen nephrology for evaluation hyponatremia. Recommended 1 liter fluid restriction. Had f/u with cardiology 05/23/23.  Stable.  No changes made. Evaluated in ER 05/26/23 - syncopal episode.  Eating at [employer].  Got up after breakfast and felt light headed.  Had several episodes of vomiting. Loss of consciousness.  Helped to the ground.  Was given IVFs. Felt to be related to dehydration.  No further episodes.  Trying to stay as active as possible.  No chest pain or sob reported.  No increased heart rate or palpitations.  Eating.  No nausea or vomiting.  Feels things are stable.  States has appt at [facility] upcoming to f/u regarding dizziness/unsteadiness.   Past Medical History:  Diagnosis Date   Arthritis    Chicken pox    Endometriosis    Esophagitis    s/p esophageal dilatation   GERD (gastroesophageal reflux disease)    History of ovarian cyst    Hypercholesterolemia    Hypertension    Hypothyroidism    Past Surgical History:  Procedure Laterality Date   ABDOMINAL SURGERY     found to have an ovarian cyst and endometriosis   APPENDECTOMY     Family History  Problem Relation Age of Onset   Lung disease Father    Heart attack Father    Heart disease Mother        myocardial infarction   Breast cancer Mother 22   Heart attack Mother    Hypertension Mother    Hyperlipidemia Mother    Breast cancer Sister 47   Hypertension Sister    Rheum arthritis Sister    Breast cancer Maternal Aunt 50   Hypertension Brother    Colon cancer Neg Hx    Social History   Socioeconomic History   Marital status: Married     Spouse name: Not on file   Number of children: Not on file   Years of education: Not on file   Highest education level: Associate degree: academic program  Occupational History   Not on file  Tobacco Use   Smoking status: Never   Smokeless tobacco: Never  Vaping Use   Vaping status: Never Used  Substance and Sexual Activity   Alcohol use: No    Alcohol/week: 0.0 standard drinks of alcohol   Drug use: No   Sexual activity: Never  Other Topics Concern   Not on file  Social History Narrative   Married   Social Determinants of Health   Financial Resource Strain: Low Risk  (05/09/2023)   Overall Financial Resource Strain (CARDIA)    Difficulty of Paying Living Expenses: Not hard at all  Food Insecurity: No Food Insecurity (05/09/2023)   Hunger Vital Sign    Worried About Running Out of Food in the Last Year: Never true    Ran Out of Food in the Last Year: Never true  Transportation Needs: No Transportation Needs (05/09/2023)   PRAPARE - Administrator, Civil Service (Medical): No    Lack of Transportation (Non-Medical): No  Physical Activity: Inactive (05/09/2023)   Exercise Vital Sign    Days of Exercise per Week:  0 days    Minutes of Exercise per Session: 0 min  Stress: Stress Concern Present (05/09/2023)   [employer] of Occupational Health - Occupational Stress Questionnaire    Feeling of Stress : To some extent  Social Connections: Socially Integrated (05/09/2023)   Social Connection and Isolation Panel [NHANES]    Frequency of Communication with Friends and Family: More than three times a week    Frequency of Social Gatherings with Friends and Family: More than three times a week    Attends Religious Services: More than 4 times per year    Active Member of [employer] or Organizations: Yes    Attends Engineer, structural: More than 4 times per year    Marital Status: Married     Review of Systems  Constitutional:  Negative for appetite change and  unexpected weight change.  HENT:  Negative for congestion and sinus pressure.   Respiratory:  Negative for cough, chest tightness and shortness of breath.   Cardiovascular:  Negative for chest pain, palpitations and leg swelling.  Gastrointestinal:  Negative for abdominal pain, diarrhea, nausea and vomiting.  Genitourinary:  Negative for difficulty urinating and dysuria.  Musculoskeletal:  Negative for joint swelling and myalgias.  Skin:  Negative for color change and rash.  Neurological:  Positive for dizziness. Negative for headaches.  Psychiatric/Behavioral:  Negative for agitation and dysphoric mood.        Objective:     BP 132/72   Pulse 61   Temp (!) 97.5 F (36.4 C) (Oral)   Ht 5\' 2"  (1.575 m)   Wt 133 lb 9.6 oz (60.6 kg)   LMP 10/21/1984   SpO2 97%   BMI 24.44 kg/m  Wt Readings from Last 3 Encounters:  06/29/23 133 lb 9.6 oz (60.6 kg)  05/26/23 130 lb (59 kg)  05/23/23 135 lb 8 oz (61.5 kg)    Physical Exam Vitals reviewed.  Constitutional:      General: She is not in acute distress.    Appearance: Normal appearance.  HENT:     Head: Normocephalic and atraumatic.     Right Ear: External ear normal.     Left Ear: External ear normal.  Eyes:     General: No scleral icterus.       Right eye: No discharge.        Left eye: No discharge.     Conjunctiva/sclera: Conjunctivae normal.  Neck:     Thyroid: No thyromegaly.  Cardiovascular:     Rate and Rhythm: Normal rate and regular rhythm.  Pulmonary:     Effort: No respiratory distress.     Breath sounds: Normal breath sounds. No wheezing.  Abdominal:     General: Bowel sounds are normal.     Palpations: Abdomen is soft.     Tenderness: There is no abdominal tenderness.  Musculoskeletal:        General: No swelling or tenderness.     Cervical back: Neck supple. No tenderness.  Lymphadenopathy:     Cervical: No cervical adenopathy.  Skin:    Findings: No erythema or rash.  Neurological:     Mental  Status: She is alert.  Psychiatric:        Mood and Affect: Mood normal.        Behavior: Behavior normal.      Outpatient Encounter Medications as of 06/29/2023  Medication Sig   acetaminophen (TYLENOL) 500 MG tablet Take 1 tablet (500 mg total) by mouth every 6 (six)  hours as needed for mild pain (or Fever >/= 101).   amLODipine (NORVASC) 5 MG tablet Take 1 tablet (5 mg total) by mouth daily.   aspirin 81 MG EC tablet Take 1 tablet (81 mg total) by mouth daily. Swallow whole.   Biotin 5 MG CAPS Take 5,000 mcg by mouth daily.   carvedilol (COREG) 12.5 MG tablet Take 1 tablet (12.5 mg total) by mouth 2 (two) times daily with a meal.   cyanocobalamin (VITAMIN B12) 1000 MCG tablet Take 1,000 mcg by mouth daily.   diphenhydrAMINE (BENADRYL) 25 mg capsule Take 25 mg by mouth every 6 (six) hours as needed.   dorzolamide-timolol (COSOPT) 22.3-6.8 MG/ML ophthalmic solution Place 1 drop into both eyes 2 (two) times daily.   levothyroxine (SYNTHROID) 75 MCG tablet Take 1 tablet (75 mcg total) by mouth daily.   lisinopril (ZESTRIL) 40 MG tablet Take 1 tablet (40 mg total) by mouth daily.   Multiple Vitamins-Minerals (ZINC PO) Take 1 tablet by mouth daily.   pantoprazole (PROTONIX) 40 MG tablet TAKE 1 TABLET BY MOUTH EVERY DAY   rivastigmine (EXELON) 1.5 MG capsule Take 1.5 mg by mouth 2 (two) times daily.   rosuvastatin (CRESTOR) 10 MG tablet Take 1 tablet (10 mg total) by mouth daily.   sertraline (ZOLOFT) 25 MG tablet Take 1 tablet (25 mg total) by mouth daily.   timolol (TIMOPTIC) 0.5 % ophthalmic solution Place 1 drop into both eyes 2 (two) times daily.   Zinc 50 MG TABS Take by mouth.   No facility-administered encounter medications on file as of 06/29/2023.     Lab Results  Component Value Date   WBC 8.4 05/26/2023   HGB 14.2 05/26/2023   HCT 40.9 05/26/2023   PLT 215 05/26/2023   GLUCOSE 116 (H) 05/26/2023   CHOL 169 04/27/2023   TRIG 190.0 (H) 04/27/2023   HDL 53.80 04/27/2023    LDLDIRECT 88.0 03/22/2021   LDLCALC 77 04/27/2023   ALT 13 04/27/2023   AST 17 04/27/2023   NA 137 05/26/2023   K 4.0 05/26/2023   CL 109 05/26/2023   CREATININE 0.75 05/26/2023   BUN 17 05/26/2023   CO2 20 (L) 05/26/2023   TSH 1.26 04/27/2023   INR 1.0 08/12/2015    DG Chest Portable 1 View  Result Date: 05/26/2023 CLINICAL DATA:  Syncope, dizziness. EXAM: PORTABLE CHEST 1 VIEW COMPARISON:  Chest radiograph dated 06/07/2022. FINDINGS: The heart size and mediastinal contours are within normal limits. Vascular calcifications are seen in the aortic arch. There is mild bibasilar interstitial opacities which appear similar to prior exam. No pleural effusion or pneumothorax. The visualized skeletal structures are unremarkable. IMPRESSION: Mild bibasilar interstitial opacities appear similar to prior exam and may represent atelectasis, bronchiectasis, and/or scarring. Electronically Signed   By: Romona Curls M.D.   On: 05/26/2023 13:48   CT HEAD WO CONTRAST ( )  Result Date: 05/26/2023 CLINICAL DATA:  Mental status change, unknown cause EXAM: CT HEAD WITHOUT CONTRAST TECHNIQUE: Contiguous axial images were obtained from the base of the skull through the vertex without intravenous contrast. RADIATION DOSE REDUCTION: This exam was performed according to the departmental dose-optimization program which includes automated exposure control, adjustment of the mA and/or kV according to patient size and/or use of iterative reconstruction technique. COMPARISON:  CT head 06/07/22 FINDINGS: Brain: No evidence of acute infarction, hemorrhage, extra-axial collection or mass lesion/mass effect. Sequela of mild chronic microvascular ischemic change. There is an acute callosal angle (49 degrees) with crowding of  the sulci at the vertex (series 4, image 37). Vascular: No hyperdense vessel or unexpected calcification. Skull: Normal. Negative for fracture or focal lesion. Sinuses/Orbits: No middle ear or mastoid  effusion. Paranasal sinuses are clear. Orbits are unremarkable. Other: None. IMPRESSION: 1. No acute intracranial abnormality. 2. Acute callosal angle (49 degrees) with crowding of the sulci at the vertex, which can be seen in the setting of normal pressure hydrocephalus. Recommend correlation with clinical symptoms. Electronically Signed   By: Lorenza Cambridge M.D.   On: 05/26/2023 13:04       Assessment & Plan:  Unsteady gait Assessment & Plan:  Evaluated at [facility] (Dr. Idelle Crouch, Dr. Glenice Bow, Dr. Roda Shutters). Documentation reviewed. Their team recommended extended lumbar train trial and a DATscan as patient has some parkinsonian features (bradykinesia, rigidity, and abnormal EOMs noted on exam). Lumbar puncture was nondiagnostic as could only drain 8mL of CSF. SPECT scan was unremarkable as well.  Patient completed Lumbar drain trial at [facility] 05/29/2022, removed 300 cc of CSF fluid, dizziness improved, balance and memory/confusion did not improve. She completed in home PT and continues to exercise on her own. Given persistent issues, has appt with [facility] neurology for further evaluation.  Feels things are stable.    Stress Assessment & Plan: Continue zoloft.    NPH (normal pressure hydrocephalus) (HCC) Assessment & Plan: Recent history of admission to [facility] for further evaluation of NPH and external lumbar drainage of csf via lumbar spinal catheter. Did not qualify for shunt placement.  Persistent dizziness. Has appt - [facility] neurology.    Memory change Assessment & Plan: Has been evaluated at [facility] as outlined.  Did not qualify for shunt.  Seeing neurology.  Patient completed Lumbar drain trial at [facility] 05/29/2022, removed 300 cc of CSF fluid, dizziness improved, balance and memory/confusion did not improve. Recently started on exelon.  Has appt [facility] - for further evaluation.    Kidney lesion Assessment & Plan: Seeing nephrology (11/2021).  Recommended yearly renal  ultrasound. F/u renal ultrasound 03/2023 - stable.  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 No other abnormalities.   Hypothyroidism, unspecified type Assessment & Plan: On thyroid replacement.  Follow tsh.    Primary hypertension Assessment & Plan: Continue amlodipine, lisinopril (40mg ) and carvedilol 12.5mg  bid.  Have them spot check her pressures.  Send in readings.  Follow metabolic panel.  Blood pressure better.    Hypercholesterolemia Assessment & Plan: Continue crestor. Follow lipid panel and liver function tests.    Fatty liver Assessment & Plan: Found on ultrasound.  Follow liver function tests.     Aortic atherosclerosis (HCC) Assessment & Plan: Continue crestor.     Angioleiomyoma Assessment & Plan: Dr Suezanne Jacquet - 07/2022 - recommended annual ultrasound.  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 However the radiologist reported that this could be a malignancy. Hence we will follow-up with his sonogram annually to assess the left kidney mass with the beginning of next year. 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 A 2.0 cm left renal cyst or parapelvic cyst is also again noted. Renal ultrasound 03/2023 -  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  No other abnormalities.        Dale Toughkenamon, MD

## 2023-07-01 ENCOUNTER — Encounter: Payer: Self-pay | Admitting: Internal Medicine

## 2023-07-01 NOTE — Assessment & Plan Note (Signed)
Continue crestor.  Follow lipid panel and liver function tests.  

## 2023-07-01 NOTE — Assessment & Plan Note (Signed)
-

## 2023-07-01 NOTE — Assessment & Plan Note (Signed)
Found on ultrasound.  Follow liver function tests.   

## 2023-07-01 NOTE — Assessment & Plan Note (Signed)
Continue crestor 

## 2023-07-01 NOTE — Assessment & Plan Note (Signed)
Has been evaluated at [facility] as outlined.  Did not qualify for shunt.  Seeing neurology.  Patient completed Lumbar drain trial at [facility] 05/29/2022, removed 300 cc of CSF fluid, dizziness improved, balance and memory/confusion did not improve. Recently started on exelon.  Has appt [facility] - for further evaluation.

## 2023-07-01 NOTE — Assessment & Plan Note (Signed)
Continue amlodipine, lisinopril (40mg) and carvedilol 12.5mg bid.  Have them spot check her pressures.  Send in readings.  Follow metabolic panel.  Blood pressure better.  

## 2023-07-01 NOTE — Assessment & Plan Note (Addendum)
Evaluated at [facility] (Dr. Idelle Crouch, Dr. Glenice Bow, Dr. Roda Shutters). Documentation reviewed. Their team recommended extended lumbar train trial and a DATscan as patient has some parkinsonian features (bradykinesia, rigidity, and abnormal EOMs noted on exam). Lumbar puncture was nondiagnostic as could only drain 8mL of CSF. SPECT scan was unremarkable as well.  Patient completed Lumbar drain trial at [facility] 05/29/2022, removed 300 cc of CSF fluid, dizziness improved, balance and memory/confusion did not improve. She completed in home PT and continues to exercise on her own. Given persistent issues, has appt with [facility] neurology for further evaluation.  Feels things are stable.

## 2023-07-01 NOTE — Assessment & Plan Note (Signed)
Recent history of admission to [facility] for further evaluation of NPH and external lumbar drainage of csf via lumbar spinal catheter. Did not qualify for shunt placement.  Persistent dizziness. Has appt - [facility].

## 2023-07-01 NOTE — Assessment & Plan Note (Signed)
Seeing nephrology (11/2021).  Recommended yearly renal ultrasound. F/u renal ultrasound 03/2023 - stable.  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 No other abnormalities.

## 2023-07-01 NOTE — Assessment & Plan Note (Signed)
On thyroid replacement.  Follow tsh.  

## 2023-07-01 NOTE — Assessment & Plan Note (Signed)
Dr Suezanne Jacquet - 07/2022 - recommended annual ultrasound.  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 However the radiologist reported that this could be a malignancy. Hence we will follow-up with his sonogram annually to assess the left kidney mass with the beginning of next year. 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 A 2.0 cm left renal cyst or parapelvic cyst is also again noted. Renal ultrasound 03/2023 -  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 No other abnormalities.

## 2023-07-22 ENCOUNTER — Other Ambulatory Visit: Payer: Self-pay | Admitting: Internal Medicine

## 2023-09-12 ENCOUNTER — Encounter: Payer: Self-pay | Admitting: Internal Medicine

## 2023-09-12 DIAGNOSIS — M7071 Other bursitis of hip, right hip: Secondary | ICD-10-CM | POA: Insufficient documentation

## 2023-10-04 ENCOUNTER — Telehealth: Payer: Self-pay

## 2023-10-04 ENCOUNTER — Ambulatory Visit (INDEPENDENT_AMBULATORY_CARE_PROVIDER_SITE_OTHER): Payer: Medicare Other | Admitting: Internal Medicine

## 2023-10-04 VITALS — BP 128/68 | HR 58 | Temp 97.6°F | Ht 62.0 in | Wt 136.4 lb

## 2023-10-04 DIAGNOSIS — F439 Reaction to severe stress, unspecified: Secondary | ICD-10-CM | POA: Diagnosis not present

## 2023-10-04 DIAGNOSIS — R2681 Unsteadiness on feet: Secondary | ICD-10-CM | POA: Diagnosis not present

## 2023-10-04 DIAGNOSIS — N289 Disorder of kidney and ureter, unspecified: Secondary | ICD-10-CM

## 2023-10-04 DIAGNOSIS — E7800 Pure hypercholesterolemia, unspecified: Secondary | ICD-10-CM | POA: Diagnosis not present

## 2023-10-04 DIAGNOSIS — I700 Atherosclerosis of aorta: Secondary | ICD-10-CM

## 2023-10-04 DIAGNOSIS — I10 Essential (primary) hypertension: Secondary | ICD-10-CM | POA: Diagnosis not present

## 2023-10-04 DIAGNOSIS — E039 Hypothyroidism, unspecified: Secondary | ICD-10-CM

## 2023-10-04 DIAGNOSIS — R413 Other amnesia: Secondary | ICD-10-CM

## 2023-10-04 DIAGNOSIS — E871 Hypo-osmolality and hyponatremia: Secondary | ICD-10-CM

## 2023-10-04 MED ORDER — SERTRALINE HCL 25 MG PO TABS: 25.0000 mg | ORAL_TABLET | Freq: Every day | ORAL | 1 refills | Status: DC

## 2023-10-04 MED ORDER — CARVEDILOL 12.5 MG PO TABS: 12.5000 mg | ORAL_TABLET | Freq: Two times a day (BID) | ORAL | 3 refills | Status: DC

## 2023-10-04 MED ORDER — LISINOPRIL 40 MG PO TABS: 40.0000 mg | ORAL_TABLET | Freq: Every day | ORAL | 3 refills | Status: DC

## 2023-10-04 NOTE — Telephone Encounter (Signed)
Patient's husband, Teres Kyles, called to state the name of the physical therapist they had discussed with Dr. Dale El[place]art is Mayer Masker, at [facility].

## 2023-10-04 NOTE — Progress Notes (Unsigned)
Subjective:    Patient ID: Riley Kill, female    DOB: 04/30/1941, 82 y.o.   MRN: [id number]  Patient here for  Chief Complaint  Patient presents with   Medical Management of Chronic Issues    3 month follow up    HPI Here to follow up regarding hypercholesterolemia, hypertension, hypothyroidism and persistent issues with dizziness and memory change. She is accompanied by her husband. History obtained from both of them. Has seen neurology. Started on exelon. Was evaluated [facility] - 07/16/23- per note, question raised to r/o neurological causes, for example parkinsonism. Recommended starting sinemet and alpha-synuclein skin biopsy. Also recommended EMG/NCS and CTA head and neck and echo. 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 Recommended baby aspirin and CTA 6 months. 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 No evidence of motor neuron disease. Has seen nephrology for evaluation hyponatremia. Recommended 1 liter fluid restriction. Had f/u with cardiology 05/23/23.  Stable.  No changes made. She is taking her medication regularly. Husband controls her medication. Denies chest pain or sob. No cough or congestion. No abdominal pain or bowel change. She and her husband are interested in PT. Report unsteady gait and balance issues and she is afraid of falling. Request PT to help and evaluate and treat.    Past Medical History:  Diagnosis Date   Arthritis    Chicken pox    Endometriosis    Esophagitis    s/p esophageal dilatation   GERD (gastroesophageal reflux disease)    History of ovarian cyst    Hypercholesterolemia    Hypertension    Hypothyroidism    Past Surgical History:  Procedure Laterality Date   ABDOMINAL SURGERY     found to have an ovarian cyst and endometriosis   APPENDECTOMY     Family History  Problem Relation Age of Onset   Lung disease Father    Heart attack Father    Heart disease Mother        myocardial  infarction   Breast cancer Mother 63   Heart attack Mother    Hypertension Mother    Hyperlipidemia Mother    Breast cancer Sister 6   Hypertension Sister    Rheum arthritis Sister    Breast cancer Maternal Aunt 50   Hypertension Brother    Colon cancer Neg Hx    Social History   Socioeconomic History   Marital status: Married    Spouse name: Not on file   Number of children: Not on file   Years of education: Not on file   Highest education level: Associate degree: occupational, Scientist, product/process development, or vocational program  Occupational History   Not on file  Tobacco Use   Smoking status: Never   Smokeless tobacco: Never  Vaping Use   Vaping status: Never Used  Substance and Sexual Activity   Alcohol use: No    Alcohol/week: 0.0 standard drinks of alcohol   Drug use: No   Sexual activity: Never  Other Topics Concern   Not on file  Social History Narrative   Married   Social Drivers of Health   Financial Resource Strain: Low Risk  (10/01/2023)   Overall Financial Resource Strain (CARDIA)    Difficulty of Paying Living Expenses: Not hard at all  Food Insecurity: No Food Insecurity (10/01/2023)   Hunger Vital Sign    Worried About Running Out of Food in the Last Year: Never true    Ran Out of  Food in the Last Year: Never true  Transportation Needs: No Transportation Needs (10/01/2023)   PRAPARE - Administrator, Civil Service (Medical): No    Lack of Transportation (Non-Medical): No  Physical Activity: Insufficiently Active (10/01/2023)   Exercise Vital Sign    Days of Exercise per Week: 7 days    Minutes of Exercise per Session: 10 min  Stress: No Stress Concern Present (10/01/2023)   [employer] of Occupational Health - Occupational Stress Questionnaire    Feeling of Stress : Only a little  Social Connections: Socially Integrated (10/01/2023)   Social Connection and Isolation Panel [NHANES]    Frequency of Communication with Friends and Family: More than  three times a week    Frequency of Social Gatherings with Friends and Family: More than three times a week    Attends Religious Services: More than 4 times per year    Active Member of [employer] or Organizations: Yes    Attends Engineer, structural: More than 4 times per year    Marital Status: Married     Review of Systems  Constitutional:  Negative for appetite change and unexpected weight change.  HENT:  Negative for congestion and sinus pressure.   Respiratory:  Negative for cough, chest tightness and shortness of breath.   Cardiovascular:  Negative for chest pain and palpitations.  Gastrointestinal:  Negative for abdominal pain, diarrhea, nausea and vomiting.  Genitourinary:  Negative for difficulty urinating and dysuria.  Musculoskeletal:  Negative for joint swelling and myalgias.  Skin:  Negative for color change and rash.  Neurological:  Negative for headaches.       Unsteadiness as outlined.   Psychiatric/Behavioral:  Negative for agitation and dysphoric mood.        Objective:     BP 128/68   Pulse (!) 58   Temp 97.6 F (36.4 C)   Ht 5\' 2"  (1.575 m)   Wt 136 lb 6.4 oz (61.9 kg)   LMP 10/21/1984   SpO2 99%   BMI 24.95 kg/m  Wt Readings from Last 3 Encounters:  10/04/23 136 lb 6.4 oz (61.9 kg)  06/29/23 133 lb 9.6 oz (60.6 kg)  05/26/23 130 lb (59 kg)    Physical Exam Vitals reviewed.  Constitutional:      General: She is not in acute distress.    Appearance: Normal appearance.  HENT:     Head: Normocephalic and atraumatic.     Right Ear: External ear normal.     Left Ear: External ear normal.     Mouth/Throat:     Pharynx: No oropharyngeal exudate or posterior oropharyngeal erythema.  Eyes:     General: No scleral icterus.       Right eye: No discharge.        Left eye: No discharge.     Conjunctiva/sclera: Conjunctivae normal.  Neck:     Thyroid: No thyromegaly.  Cardiovascular:     Rate and Rhythm: Normal rate and regular rhythm.   Pulmonary:     Effort: No respiratory distress.     Breath sounds: Normal breath sounds. No wheezing.  Abdominal:     General: Bowel sounds are normal.     Palpations: Abdomen is soft.     Tenderness: There is no abdominal tenderness.  Musculoskeletal:        General: No swelling or tenderness.     Cervical back: Neck supple. No tenderness.  Lymphadenopathy:     Cervical: No cervical adenopathy.  Skin:    Findings: No erythema or rash.  Neurological:     Mental Status: She is alert.     Comments: Walks with support.   Psychiatric:        Mood and Affect: Mood normal.        Behavior: Behavior normal.      Outpatient Encounter Medications as of 10/04/2023  Medication Sig   acetaminophen (TYLENOL) 500 MG tablet Take 1 tablet (500 mg total) by mouth every 6 (six) hours as needed for mild pain (or Fever >/= 101).   amLODipine (NORVASC) 5 MG tablet Take 1 tablet (5 mg total) by mouth daily.   aspirin 81 MG EC tablet Take 1 tablet (81 mg total) by mouth daily. Swallow whole.   Biotin 5 MG CAPS Take 5,000 mcg by mouth daily.   carvedilol (COREG) 12.5 MG tablet Take 1 tablet (12.5 mg total) by mouth 2 (two) times daily with a meal.   dorzolamide-timolol (COSOPT) 22.3-6.8 MG/ML ophthalmic solution Place 1 drop into both eyes 2 (two) times daily.   levothyroxine (SYNTHROID) 75 MCG tablet Take 1 tablet (75 mcg total) by mouth daily.   lisinopril (ZESTRIL) 40 MG tablet Take 1 tablet (40 mg total) by mouth daily.   Multiple Vitamins-Minerals (ZINC PO) Take 1 tablet by mouth daily.   pantoprazole (PROTONIX) 40 MG tablet TAKE 1 TABLET BY MOUTH EVERY DAY   rivastigmine (EXELON) 1.5 MG capsule Take 1.5 mg by mouth 2 (two) times daily.   rosuvastatin (CRESTOR) 10 MG tablet Take 1 tablet (10 mg total) by mouth daily.   sertraline (ZOLOFT) 25 MG tablet Take 1 tablet (25 mg total) by mouth daily.   timolol (TIMOPTIC) 0.5 % ophthalmic solution Place 1 drop into both eyes 2 (two) times daily.    Zinc 50 MG TABS Take by mouth.   [DISCONTINUED] carvedilol (COREG) 12.5 MG tablet Take 1 tablet (12.5 mg total) by mouth 2 (two) times daily with a meal.   [DISCONTINUED] cyanocobalamin (VITAMIN B12) 1000 MCG tablet Take 1,000 mcg by mouth daily.   [DISCONTINUED] diphenhydrAMINE (BENADRYL) 25 mg capsule Take 25 mg by mouth every 6 (six) hours as needed.   [DISCONTINUED] lisinopril (ZESTRIL) 40 MG tablet Take 1 tablet (40 mg total) by mouth daily.   [DISCONTINUED] sertraline (ZOLOFT) 25 MG tablet TAKE 1 TABLET (25 MG TOTAL) BY MOUTH DAILY.   No facility-administered encounter medications on file as of 10/04/2023.     Lab Results  Component Value Date   WBC 8.4 05/26/2023   HGB 14.2 05/26/2023   HCT 40.9 05/26/2023   PLT 215 05/26/2023   GLUCOSE 88 10/04/2023   CHOL 161 10/04/2023   TRIG 197.0 (H) 10/04/2023   HDL 53.20 10/04/2023   LDLDIRECT 88.0 03/22/2021   LDLCALC 69 10/04/2023   ALT 9 10/04/2023   AST 14 10/04/2023   NA 142 10/04/2023   K 4.3 10/04/2023   CL 104 10/04/2023   CREATININE 0.72 10/04/2023   BUN 16 10/04/2023   CO2 27 10/04/2023   TSH 1.26 04/27/2023   INR 1.0 08/12/2015    DG Chest Portable 1 View Result Date: 05/26/2023 CLINICAL DATA:  Syncope, dizziness. EXAM: PORTABLE CHEST 1 VIEW COMPARISON:  Chest radiograph dated 06/07/2022. FINDINGS: The heart size and mediastinal contours are within normal limits. Vascular calcifications are seen in the aortic arch. There is mild bibasilar interstitial opacities which appear similar to prior exam. No pleural effusion or pneumothorax. The visualized skeletal structures are unremarkable. IMPRESSION: Mild bibasilar  interstitial opacities appear similar to prior exam and may represent atelectasis, bronchiectasis, and/or scarring. Electronically Signed   By: Romona Curls M.D.   On: 05/26/2023 13:48   CT HEAD WO CONTRAST ( ) Result Date: 05/26/2023 CLINICAL DATA:  Mental status change, unknown cause EXAM: CT HEAD WITHOUT  CONTRAST TECHNIQUE: Contiguous axial images were obtained from the base of the skull through the vertex without intravenous contrast. RADIATION DOSE REDUCTION: This exam was performed according to the departmental dose-optimization program which includes automated exposure control, adjustment of the mA and/or kV according to patient size and/or use of iterative reconstruction technique. COMPARISON:  CT head 06/07/22 FINDINGS: Brain: No evidence of acute infarction, hemorrhage, extra-axial collection or mass lesion/mass effect. Sequela of mild chronic microvascular ischemic change. There is an acute callosal angle (49 degrees) with crowding of the sulci at the vertex (series 4, image 37). Vascular: No hyperdense vessel or unexpected calcification. Skull: Normal. Negative for fracture or focal lesion. Sinuses/Orbits: No middle ear or mastoid effusion. Paranasal sinuses are clear. Orbits are unremarkable. Other: None. IMPRESSION: 1. No acute intracranial abnormality. 2. Acute callosal angle (49 degrees) with crowding of the sulci at the vertex, which can be seen in the setting of normal pressure hydrocephalus. Recommend correlation with clinical symptoms. Electronically Signed   By: Lorenza Cambridge M.D.   On: 05/26/2023 13:04       Assessment & Plan:  Hypercholesterolemia Assessment & Plan: Continue crestor. Follow lipid panel and liver function tests.   Orders: -     Lipid panel -     Hepatic function panel -     Basic metabolic panel  Unsteady gait Assessment & Plan:  Evaluated at [facility] (Dr. Idelle Crouch, Dr. Glenice Bow, Dr. Roda Shutters). Documentation reviewed. Their team recommended extended lumbar train trial and a DATscan as patient has some parkinsonian features (bradykinesia, rigidity, and abnormal EOMs noted on exam). Lumbar puncture was nondiagnostic as could only drain 8mL of CSF. SPECT scan was unremarkable as well.  Patient completed Lumbar drain trial at [facility] 05/29/2022, removed 300 cc of CSF  fluid, dizziness improved, balance and memory/confusion did not improve. She completed in home PT. Given persistent issues, had appt with [facility] neurology for further evaluation.  Appt as outlined. Placed on sinemet. Also recommended EMG/NCS and CTA head and neck and echo. 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 Recommended baby aspirin and CTA 6 months. 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 No evidence of motor neuron disease.awaiting further testing. Given persistent balance issues and unsteady gait, refer to outpatient PT.   Orders: -     Ambulatory referral to Physical Therapy  Stress Assessment & Plan: Continue zoloft.    Memory change Assessment & Plan: Has been evaluated at [facility] as outlined.  Did not qualify for shunt.  Seeing neurology.  Patient completed Lumbar drain trial at [facility] 05/29/2022, removed 300 cc of CSF fluid, dizziness improved, balance and memory/confusion did not improve. Continue exelon.    Kidney lesion Assessment & Plan: Seeing nephrology (11/2021).  Recommended yearly renal ultrasound. F/u renal ultrasound 03/2023 - stable.  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 No other abnormalities.   Hypothyroidism, unspecified type Assessment & Plan: On thyroid replacement.  Follow tsh.    Hyponatremia Assessment & Plan: Has seen nephrology for evaluation hyponatremia.  Recommended 1 liter fluid restriction.  She states she is  monitoring her fluid intake.   Primary hypertension Assessment & Plan: Continue amlodipine, lisinopril (40mg ) and carvedilol 12.5mg  bid.  Have them spot check her pressures.  Send in readings.  Follow metabolic panel.  Blood pressure better.    Aortic atherosclerosis (HCC) Assessment & Plan: Continue crestor.     Other orders -     Carvedilol; Take 1 tablet (12.5 mg total) by mouth 2 (two) times daily with  a meal.  Dispense: 180 tablet; Refill: 3 -     Lisinopril; Take 1 tablet (40 mg total) by mouth daily.  Dispense: 90 tablet; Refill: 3 -     Sertraline HCl; Take 1 tablet (25 mg total) by mouth daily.  Dispense: 90 tablet; Refill: 1     Dale [place], MD

## 2023-10-05 LAB — BASIC METABOLIC PANEL
BUN: 16 mg/dL (ref 6–23)
CO2: 27 meq/L (ref 19–32)
Calcium: 10.4 mg/dL (ref 8.4–10.5)
Chloride: 104 meq/L (ref 96–112)
Creatinine, Ser: 0.72 mg/dL (ref 0.40–1.20)
GFR: 77.11 mL/min (ref >60.00)
Glucose, Bld: 88 mg/dL (ref 70–99)
Potassium: 4.3 meq/L (ref 3.5–5.1)
Sodium: 142 meq/L (ref 135–145)

## 2023-10-05 LAB — HEPATIC FUNCTION PANEL
ALT: 9 U/L (ref 0–35)
AST: 14 U/L (ref 0–37)
Albumin: 4.5 g/dL (ref 3.5–5.2)
Alkaline Phosphatase: 68 U/L (ref 39–117)
Bilirubin, Direct: 0.3 mg/dL (ref 0.0–0.3)
Total Bilirubin: 1.6 mg/dL — ABNORMAL HIGH (ref 0.2–1.2)
Total Protein: 6.6 g/dL (ref 6.0–8.3)

## 2023-10-05 LAB — LIPID PANEL
Cholesterol: 161 mg/dL (ref 0–200)
HDL: 53.2 mg/dL (ref >39.00)
LDL Cholesterol: 69 mg/dL (ref 0–99)
NonHDL: 108.17
Total CHOL/HDL Ratio: 3
Triglycerides: 197 mg/dL — ABNORMAL HIGH (ref 0.0–149.0)
VLDL: 39.4 mg/dL (ref 0.0–40.0)

## 2023-10-05 NOTE — Telephone Encounter (Signed)
Pt informed

## 2023-10-05 NOTE — Telephone Encounter (Signed)
Please notify - order has been placed for PT referral to Mayer Masker, at [facility].

## 2023-10-07 ENCOUNTER — Encounter: Payer: Self-pay | Admitting: Internal Medicine

## 2023-10-07 NOTE — Assessment & Plan Note (Signed)
-

## 2023-10-07 NOTE — Assessment & Plan Note (Signed)
Has been evaluated at [facility] as outlined.  Did not qualify for shunt.  Seeing neurology.  Patient completed Lumbar drain trial at [facility] 05/29/2022, removed 300 cc of CSF fluid, dizziness improved, balance and memory/confusion did not improve. Continue exelon.

## 2023-10-07 NOTE — Assessment & Plan Note (Signed)
On thyroid replacement.  Follow tsh.  

## 2023-10-07 NOTE — Assessment & Plan Note (Signed)
Seeing nephrology (11/2021).  Recommended yearly renal ultrasound. F/u renal ultrasound 03/2023 - stable.  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 No other abnormalities.

## 2023-10-07 NOTE — Assessment & Plan Note (Signed)
Continue crestor 

## 2023-10-07 NOTE — Assessment & Plan Note (Signed)
Continue amlodipine, lisinopril (40mg) and carvedilol 12.5mg bid.  Have them spot check her pressures.  Send in readings.  Follow metabolic panel.  Blood pressure better.  

## 2023-10-07 NOTE — Assessment & Plan Note (Signed)
Has seen nephrology for evaluation hyponatremia.  Recommended 1 liter fluid restriction.  She states she is monitoring her fluid intake.

## 2023-10-07 NOTE — Assessment & Plan Note (Signed)
Continue crestor.  Follow lipid panel and liver function tests.  

## 2023-10-07 NOTE — Assessment & Plan Note (Signed)
Evaluated at [facility] (Dr. Idelle Crouch, Dr. Glenice Bow, Dr. Roda Shutters). Documentation reviewed. Their team recommended extended lumbar train trial and a DATscan as patient has some parkinsonian features (bradykinesia, rigidity, and abnormal EOMs noted on exam). Lumbar puncture was nondiagnostic as could only drain 8mL of CSF. SPECT scan was unremarkable as well.  Patient completed Lumbar drain trial at [facility] 05/29/2022, removed 300 cc of CSF fluid, dizziness improved, balance and memory/confusion did not improve. She completed in home PT. Given persistent issues, had appt with [facility] neurology for further evaluation.  Appt as outlined. Placed on sinemet. Also recommended EMG/NCS and CTA head and neck and echo. 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 Recommended baby aspirin and CTA 6 months. 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 No evidence of motor neuron disease.awaiting further testing. Given persistent balance issues and unsteady gait, refer to outpatient PT.

## 2023-10-08 ENCOUNTER — Other Ambulatory Visit: Payer: Self-pay

## 2023-10-08 ENCOUNTER — Telehealth: Payer: Self-pay | Admitting: Internal Medicine

## 2023-10-08 MED ORDER — RIVASTIGMINE TARTRATE 1.5 MG PO CAPS: 1.5000 mg | ORAL_CAPSULE | Freq: Two times a day (BID) | ORAL | 3 refills | Status: DC

## 2023-10-08 NOTE — Telephone Encounter (Signed)
Originally prescribed by Dr Sherryll Burger. Pt is no longer seeing him. She is taking exelon 1.5 mg bid. She is wondering if she should stay on this medication and if so, would you mind refilling?

## 2023-10-08 NOTE — Telephone Encounter (Signed)
Patient is aware 

## 2023-10-08 NOTE — Telephone Encounter (Signed)
Medication refilled

## 2023-10-08 NOTE — Telephone Encounter (Signed)
Prescription Request  10/08/2023  LOV: 10/04/2023  What is the name of the medication or equipment?  rivastigmine (EXELON) 1.5 MG capsule    Have you contacted your pharmacy to request a refill? No   Which pharmacy would you like this sent to?   [id number] [place], [place] - [address] [place] [address] Phone: [phone] Fax: [phone]      Patient notified that their request is being sent to the clinical staff for review and that they should receive a response within 2 business days.   Please advise at [facility] [phone]

## 2023-10-08 NOTE — Telephone Encounter (Signed)
If she has been taking regularly and tolerating, ok to refill.

## 2023-11-19 ENCOUNTER — Ambulatory Visit: Payer: Medicare Other | Attending: Internal Medicine

## 2023-11-19 DIAGNOSIS — R2681 Unsteadiness on feet: Secondary | ICD-10-CM

## 2023-11-19 DIAGNOSIS — M6281 Muscle weakness (generalized): Secondary | ICD-10-CM

## 2023-11-19 NOTE — Therapy (Unsigned)
OUTPATIENT PHYSICAL THERAPY BALANCE EVALUATION   Patient Name: Crystal Lynch MRN: [id number] DOB:November 01, 1940, 83 y.o., female Today's Date: 11/20/2023  END OF SESSION:  PT End of Session - 11/19/23 1341     Visit Number 1    Number of Visits 17    Date for PT Re-Evaluation 01/14/24    Authorization Type eval: 11/19/23    PT Start Time 1400    PT Stop Time 1445    PT Time Calculation (min) 45 min    Equipment Utilized During Treatment Gait belt    Activity Tolerance Patient tolerated treatment well;No increased pain    Behavior During Therapy WFL for tasks assessed/performed            Past Medical History:  Diagnosis Date   Arthritis    Chicken pox    Endometriosis    Esophagitis    s/p esophageal dilatation   GERD (gastroesophageal reflux disease)    History of ovarian cyst    Hypercholesterolemia    Hypertension    Hypothyroidism    Past Surgical History:  Procedure Laterality Date   ABDOMINAL SURGERY     found to have an ovarian cyst and endometriosis   APPENDECTOMY     Patient Active Problem List   Diagnosis Date Noted   Bursitis of right hip 09/12/2023   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   Stress 07/27/2022   Unsteady gait 07/10/2022   Hypophosphatemia 06/11/2022   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   OSA (obstructive sleep apnea) 06/09/2022   Kidney lesion 12/14/2021   Left hip pain 12/13/2021   Memory change 11/27/2021   Fatigue 07/23/2021   Aortic atherosclerosis (HCC) 04/17/2021   Right acute serous otitis media    Hyponatremia 02/23/2021   Prolonged QT interval 02/23/2021   Dizziness 09/12/2020   Fall 03/08/2020   Fatty liver 12/28/2019   Chest pain 07/25/2019   Frequent urinary tract infections 12/24/2018   UTI (urinary tract infection) 10/11/2018   Hypercalcemia 01/09/2017   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   Sleeping difficulties 03/08/2015   Health care maintenance 01/10/2015   Osteopenia 08/10/2014    Hyperbilirubinemia 10/27/2012   Hematuria 10/27/2012   Hypertension 10/21/2012   Hypercholesterolemia 10/21/2012   Hypothyroidism 10/21/2012   Esophagitis 10/21/2012    PCP: Dale Pax, MD  REFERRING PROVIDER: Dale [place], MD   REFERRING DIAG: R26.81 (ICD-10-CM) - Unsteady gait   RATIONALE FOR EVALUATION AND TREATMENT: Rehabilitation  THERAPY DIAG: Unsteadiness on feet  Muscle weakness (generalized)  ONSET DATE: Chronic x years  FOLLOW-UP APPT SCHEDULED WITH REFERRING PROVIDER: Yes    SUBJECTIVE:  SUBJECTIVE STATEMENT:  Unsteadiness  PERTINENT HISTORY:  Pt referred to PT for unsteadiness and dizziness. History today obtained from both patient and husband. "I'm dizzy all the time." She denies vertigo but reports persistent unsteadiness with occasional lightheadedness. Pt has a history of NPH. She underwent a shunt trial at [facility] but there was not enough change in her testing to merit a permanent shunt placement. She is currently being followed by [facility] neurology and has a follow-up appointment this Wednesday. 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 She had a alpha-synuclein skin biopsy, EMG/NCS, echo, and CTA head and neck. 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 Recommended baby aspirin and CTA 6 months. 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 No evidence of motor neuron disease.  Pt reports shuffling gait and freezing episodes but no tremor. She was started on carbidopa-levodopa but has not noticed any change in her symptoms since starting. She uses a rollator at home and front wheeled walker when out in the community. Pt wears eyeglasses all the time and has an upcoming check-up with optometry. History of occasional sinus headaches but no history  of migraines. Pt reports some L hip weakness.  Pain: Yes, L hip pain Numbness/Tingling: No Focal Weakness: Yes, L hip weakness related to pain; Recent changes in overall health/medication: Yes, carvidopa/levodopa Prior history of physical therapy for balance:  Yes Dominant hand: right Red flags: no personal history of cancer, abdominal pain, chills/fever, night sweats, nausea, vomiting, unrelenting pain  PRECAUTIONS: None  WEIGHT BEARING RESTRICTIONS: No  FALLS: Has patient fallen in last 6 months? Yes. Number of falls 7-8 , Directional pattern for falls: Yes Backwards  , often occurs when turning.   Living Environment Lives with: lives with their spouse Lives in: House/apartment Stairs: 2 to enter, railing on left during ascend Has following equipment at home: [facility] over commode, walk-in shower with seat and grab bars. Wheelchair for extended distances.   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   Hobbies: Enjoys crafting, reading.   Patient Goals: Build confidence in her balance   OBJECTIVE:   Patient Surveys  ABC: 40%  Cognition Patient is oriented to person, place, and time.  Recent memory is grossly intact however some memory deficit is evident. Remote memory is intact.  Attention span and concentration are intact.  Expressive speech is intact.  Patient's fund of knowledge is within normal limits for educational level.    Gross Musculoskeletal Assessment Tremor: None Bulk: Normal Tone: Normal  Posture: No gross abnormalities noted in standing or seated posture with the exception of some mild forward head.  AROM Deferred  LE MMT: MMT (out of 5) Right  Left   Hip flexion 5 4+  Hip extension    Hip abduction (seated) 4+ 4  Hip adduction 4+ 4  Hip internal rotation    Hip external rotation    Knee flexion (seated) 5 5  Knee extension 5 5  Ankle dorsiflexion 5 5  (* =  pain; Blank rows = not tested)  Sensation Deferred  Reflexes Deferred  Cranial Nerves Deferred  Coordination/Cerebellar Finger to Nose: Mild dysmetria LUE Heel to Shin: Deferred Rapid alternating movements: Abnormal LUE Finger Opposition: WNL Pronator Drift: Negative  Bed mobility: Deferred  Transfers: Assistive device utilized: Environmental consultant - 2 wheeled  Sit to stand: Modified independence Stand to sit: Modified independence Chair to chair: Modified independence Floor: Deferred  Curb:  Deferred  Stairs: Level of Assistance: Modified independence Stair Negotiation Technique: Step to Pattern with Bilateral Rails Number of Stairs: 4  Height of Stairs: 6"  Comments: Pt moves slowly and leans backwards during descent  Gait: Gait pattern: step through pattern, decreased step length- Right, decreased step length- Left, and shuffling Distance walked: 100' Assistive device utilized: Environmental consultant - 2 wheeled Level of assistance: Modified independence Comments: ataxic gait with shuffling steps;  Functional Outcome Measures  Results Comments  BERG 19/56 High fall risk  DGI    FGA    TUG 19.3 seconds Increased fall risk  5TSTS 19.7 seconds Increased fall risk  6 Minute Walk Test    10 Meter Gait Speed Self-selected: 15.5s = 0.65 m/s; Fastest: 12.5s = 0.8 m/s Below functional community ambulation speeds  (Blank rows = not tested)   TODAY'S TREATMENT  Deferred  PATIENT EDUCATION:  Education details: Plan of care Person educated: Patient Education method: Explanation Education comprehension: verbalized understanding   HOME EXERCISE PROGRAM:  Deferred  ASSESSMENT:  CLINICAL IMPRESSION: Patient is a 83 y.o. female who was seen today for physical therapy evaluation and treatment for imbalance. Pt is a high fall risk scoring 19/56 on the BERG. TUG, 5TSTS, and 68m gait speed all place pt in high fall risk category as well.   OBJECTIVE IMPAIRMENTS: decreased balance and  decreased strength.   ACTIVITY LIMITATIONS: standing, squatting, stairs, and transfers  PARTICIPATION LIMITATIONS: meal prep, cleaning, laundry, shopping, and community activity  PERSONAL FACTORS: Age, Past/current experiences, Time since onset of injury/illness/exacerbation, and 3+ comorbidities: memory change, fatigue, and NPH  are also affecting patient's functional outcome.   REHAB POTENTIAL: Fair    CLINICAL DECISION MAKING: Unstable/unpredictable  EVALUATION COMPLEXITY: High   GOALS: Goals reviewed with patient? Yes  SHORT TERM GOALS: Target date: 12/17/2023   Pt will be independent with HEP in order to improve strength and balance in order to decrease fall risk and improve function at home. Baseline:  Goal status: INITIAL   LONG TERM GOALS: Target date: 01/14/2024  1.  Pt will improve BERG by at least 3 points in order to demonstrate clinically significant improvement in balance.   Baseline: 19/56 Goal status: INITIAL  2.  Pt will improve ABC by at least 13% in order to demonstrate clinically significant improvement in balance confidence.      Baseline: 40% Goal status: INITIAL  3. Pt will decrease 5TSTS by at least 3 seconds in order to demonstrate clinically significant improvement in LE strength      Baseline: 19.7s Goal status: INITIAL  4. Pt will decrease TUG to below 14 seconds/decrease in order to demonstrate decreased fall risk.  Baseline: 19.3s Goal status: INITIAL   PLAN: PT FREQUENCY: 2x/week  PT DURATION: 8 weeks  PLANNED INTERVENTIONS: Therapeutic exercises, Therapeutic activity, Neuromuscular re-education, Balance training, Gait training, Patient/Family education, Self Care, Joint mobilization, Joint manipulation, Vestibular training, Canalith repositioning, Orthotic/Fit training, DME instructions, Dry Needling, Electrical stimulation, Spinal manipulation, Spinal mobilization, Cryotherapy, Moist heat, Taping, Traction, Ultrasound, Ionotophoresis  4mg /ml Dexamethasone, Manual therapy, and Re-evaluation.  PLAN FOR NEXT SESSION: mCTSIB, DGI, vestibular screening, initiate balance and strength exercises, issue HEP;    Sharalyn Ink Erman Thum PT, DPT, GCS  Cristyn Crossno 11/20/2023, 10:23 AM

## 2023-11-26 ENCOUNTER — Ambulatory Visit: Payer: Medicare Other | Attending: Internal Medicine

## 2023-11-26 DIAGNOSIS — R262 Difficulty in walking, not elsewhere classified: Secondary | ICD-10-CM | POA: Diagnosis not present

## 2023-11-26 DIAGNOSIS — R278 Other lack of coordination: Secondary | ICD-10-CM | POA: Diagnosis present

## 2023-11-26 DIAGNOSIS — R2681 Unsteadiness on feet: Secondary | ICD-10-CM | POA: Insufficient documentation

## 2023-11-26 DIAGNOSIS — M6281 Muscle weakness (generalized): Secondary | ICD-10-CM | POA: Insufficient documentation

## 2023-11-26 NOTE — Therapy (Signed)
OUTPATIENT PHYSICAL THERAPY BALANCE TREATMENT   Patient Name: Crystal Lynch MRN: [id number] DOB:[birth year], 83 y.o., female Today's Date: 11/26/2023  END OF SESSION:  PT End of Session - 11/26/23 0844     Visit Number 2    Number of Visits 17    Date for PT Re-Evaluation 01/14/24    Authorization Type eval: 11/19/23    PT Start Time 0845    PT Stop Time 0930    PT Time Calculation (min) 45 min    Equipment Utilized During Treatment Gait belt    Activity Tolerance Patient tolerated treatment well;No increased pain    Behavior During Therapy WFL for tasks assessed/performed            Past Medical History:  Diagnosis Date   Arthritis    Chicken pox    Endometriosis    Esophagitis    s/p esophageal dilatation   GERD (gastroesophageal reflux disease)    History of ovarian cyst    Hypercholesterolemia    Hypertension    Hypothyroidism    Past Surgical History:  Procedure Laterality Date   ABDOMINAL SURGERY     found to have an ovarian cyst and endometriosis   APPENDECTOMY     Patient Active Problem List   Diagnosis Date Noted   Bursitis of right hip 09/12/2023   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   Stress 07/27/2022   Unsteady gait 07/10/2022   Hypophosphatemia 06/11/2022   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   OSA (obstructive sleep apnea) 06/09/2022   Kidney lesion 12/14/2021   Left hip pain 12/13/2021   Memory change 11/27/2021   Fatigue 07/23/2021   Aortic atherosclerosis (HCC) 04/17/2021   Right acute serous otitis media    Hyponatremia 02/23/2021   Prolonged QT interval 02/23/2021   Dizziness 09/12/2020   Fall 03/08/2020   Fatty liver 12/28/2019   Chest pain 07/25/2019   Frequent urinary tract infections 12/24/2018   UTI (urinary tract infection) 10/11/2018   Hypercalcemia 01/09/2017   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   Sleeping difficulties 03/08/2015   Health care maintenance 01/10/2015   Osteopenia 08/10/2014    Hyperbilirubinemia 10/27/2012   Hematuria 10/27/2012   Hypertension 10/21/2012   Hypercholesterolemia 10/21/2012   Hypothyroidism 10/21/2012   Esophagitis 10/21/2012    PCP: Dale Mart, MD  REFERRING PROVIDER: Dale [place], MD   REFERRING DIAG: R26.81 (ICD-10-CM) - Unsteady gait   RATIONALE FOR EVALUATION AND TREATMENT: Rehabilitation  THERAPY DIAG: Unsteadiness on feet  Muscle weakness (generalized)  ONSET DATE: Chronic x years  FOLLOW-UP APPT SCHEDULED WITH REFERRING PROVIDER: Yes   FROM INITIAL EVALUATION SUBJECTIVE:  SUBJECTIVE STATEMENT:  Unsteadiness  PERTINENT HISTORY:  Pt referred to PT for unsteadiness and dizziness. History today obtained from both patient and husband. "I'm dizzy all the time." She denies vertigo but reports persistent unsteadiness with occasional lightheadedness. Pt has a history of NPH. She underwent a shunt trial at [facility] but there was not enough change in her testing to merit a permanent shunt placement. She is currently being followed by [facility] neurology and has a follow-up appointment this Wednesday. 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 She had a alpha-synuclein skin biopsy, EMG/NCS, echo, and CTA head and neck. 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 Recommended baby aspirin and CTA 6 months. 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 No evidence of motor neuron disease.  Pt reports shuffling gait and freezing episodes but no tremor. She was started on carbidopa-levodopa but has not noticed any change in her symptoms since starting. She uses a rollator at home and front wheeled walker when out in the community. Pt wears eyeglasses all the time and has an upcoming check-up with optometry. History of occasional sinus  headaches but no history of migraines. Pt reports some L hip weakness.  Pain: Yes, L hip pain Numbness/Tingling: No Focal Weakness: Yes, L hip weakness related to pain; Recent changes in overall health/medication: Yes, carvidopa/levodopa Prior history of physical therapy for balance:  Yes Dominant hand: right Red flags: no personal history of cancer, abdominal pain, chills/fever, night sweats, nausea, vomiting, unrelenting pain  PRECAUTIONS: None  WEIGHT BEARING RESTRICTIONS: No  FALLS: Has patient fallen in last 6 months? Yes. Number of falls 7-8 , Directional pattern for falls: Yes Backwards  , often occurs when turning.   Living Environment Lives with: lives with their spouse Lives in: House/apartment Stairs: 2 to enter, railing on left during ascend Has following equipment at home: [facility] over commode, walk-in shower with seat and grab bars. Wheelchair for extended distances.   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   Hobbies: Enjoys crafting, reading.   Patient Goals: Build confidence in her balance   OBJECTIVE:   Patient Surveys  ABC: 40%  Cognition Patient is oriented to person, place, and time.  Recent memory is grossly intact however some memory deficit is evident. Remote memory is intact.  Attention span and concentration are intact.  Expressive speech is intact.  Patient's fund of knowledge is within normal limits for educational level.    Gross Musculoskeletal Assessment Tremor: None Bulk: Normal Tone: Normal  Posture: No gross abnormalities noted in standing or seated posture with the exception of some mild forward head.  AROM Deferred  LE MMT: MMT (out of 5) Right  Left   Hip flexion 5 4+  Hip extension    Hip abduction (seated) 4+ 4  Hip adduction 4+ 4  Hip internal rotation    Hip external rotation    Knee flexion (seated) 5 5  Knee extension 5 5   Ankle dorsiflexion 5 5  (* = pain; Blank rows = not tested)  Sensation Deferred  Reflexes Deferred  Cranial Nerves Deferred  Coordination/Cerebellar Finger to Nose: Mild dysmetria LUE Heel to Shin: Deferred Rapid alternating movements: Abnormal LUE Finger Opposition: WNL Pronator Drift: Negative  Bed mobility: Deferred  Transfers: Assistive device utilized: Environmental consultant - 2 wheeled  Sit to stand: Modified independence Stand to sit: Modified independence Chair to chair: Modified independence Floor: Deferred  Curb:  Deferred  Stairs: Level of Assistance: Modified independence Stair Negotiation Technique: Step to Pattern with Bilateral Rails Number of Stairs: 4  Height of Stairs: 6"  Comments: Pt moves slowly and leans backwards during descent  Gait: Gait pattern: step through pattern, decreased step length- Right, decreased step length- Left, and shuffling Distance walked: 100' Assistive device utilized: Environmental consultant - 2 wheeled Level of assistance: Modified independence Comments: ataxic gait with shuffling steps;  Functional Outcome Measures  Results Comments  BERG 19/56 High fall risk  DGI    FGA    TUG 19.3 seconds Increased fall risk  5TSTS 19.7 seconds Increased fall risk  6 Minute Walk Test    10 Meter Gait Speed Self-selected: 15.5s = 0.65 m/s; Fastest: 12.5s = 0.8 m/s Below functional community ambulation speeds  (Blank rows = not tested)   TODAY'S TREATMENT    SUBJECTIVE: Pt reports that she is doing well today. No changes since the initial evaluation. Denies pain. No specific questions or concerns.    PAIN: Denies   Neuromuscular Re-education  NuStep L2-4 x 7 minutes at start of session for warm-up, BLE strengthening, and interval history; mCTSIB: unable to complete, pt only able to stand for approximately 2-3s in condition 1; DGI: 8/24;  BPPV TESTS:  Symptoms Duration Intensity Nystagmus  L Dix-Hallpike Dizziness/lightheadedness   None  R  Dix-Hallpike Dizziness/lightheadedness   None  L Head Roll None   None  R Head Roll None   None  L Sidelying Test      R Sidelying Test      (blank = not tested)  Orthostatic vitals: Supine: BP: 151/66 mmHg, HR: 66 bpm, SpO2: 97% Seated: BP: 151/58 mmHg, HR: 68 bpm, SpO2: 97% Standing (1 minute): BP: 144/60 mmHg, HR: 71 bpm, SpO2: 98% Standing (3 minutes): BP: 149/63 mmHg, HR: 69 bpm, SpO2: 99%   In standing with back to corner performed: Feet apart static balance gradually progressing feet closer together x 60s; Feet apart balance gradually progressing feet closer together with slow horizontal and vertical head turns x 60s;   PATIENT EDUCATION:  Education details: Plan of care, examination findings, HEP Person educated: Patient and Spouse Education method: Explanation, Verbal cues, and Handouts Education comprehension: verbalized understanding, returned demonstration, and verbal cues required   HOME EXERCISE PROGRAM:  Access Code: [email or web address] URL: [email or web address] Date: 11/26/2023 Prepared by: Ria Comment  Exercises - Standing Balance in Corner  - 1 x daily - 7 x weekly - 3 reps - 60s hold - Corner Balance Feet Together: Eyes Open With Head Turns  - 1 x daily - 7 x weekly - 3 reps - 60s hold   ASSESSMENT:  CLINICAL IMPRESSION: Performed additional testing with patient today. All BPPV testing is negative. Severe balance deficits identified on the mCTSIB and DGI. Orthostatic vitals negative. Initiated balance exercises during session today with patient. Issued HEP and reviewed with patient and husband. Plan to progress balance and strength exercises at future sessions. Pt encouraged to follow-up as scheduled. Pt will benefit from PT services to address deficits in strength, balance, and mobility in order to return to full function at home and decrease her risk for falls.    OBJECTIVE IMPAIRMENTS: decreased balance and decreased strength.   ACTIVITY  LIMITATIONS: standing, squatting, stairs, and transfers  PARTICIPATION LIMITATIONS: meal prep, cleaning, laundry, shopping, and community activity  PERSONAL FACTORS: Age, Past/current experiences, Time since onset of injury/illness/exacerbation, and 3+ comorbidities: memory change, fatigue, and NPH  are also affecting patient's functional outcome.  REHAB POTENTIAL: Fair    CLINICAL DECISION MAKING: Unstable/unpredictable  EVALUATION COMPLEXITY: High   GOALS: Goals reviewed with patient? Yes  SHORT TERM GOALS: Target date: 12/17/2023   Pt will be independent with HEP in order to improve strength and balance in order to decrease fall risk and improve function at home. Baseline:  Goal status: INITIAL   LONG TERM GOALS: Target date: 01/14/2024  1.  Pt will improve BERG by at least 3 points in order to demonstrate clinically significant improvement in balance.   Baseline: 19/56 Goal status: INITIAL  2.  Pt will improve ABC by at least 13% in order to demonstrate clinically significant improvement in balance confidence.      Baseline: 40% Goal status: INITIAL  3. Pt will decrease 5TSTS by at least 3 seconds in order to demonstrate clinically significant improvement in LE strength      Baseline: 19.7s Goal status: INITIAL  4. Pt will decrease TUG to below 14 seconds/decrease in order to demonstrate decreased fall risk.  Baseline: 19.3s Goal status: INITIAL   PLAN: PT FREQUENCY: 2x/week  PT DURATION: 8 weeks  PLANNED INTERVENTIONS: Therapeutic exercises, Therapeutic activity, Neuromuscular re-education, Balance training, Gait training, Patient/Family education, Self Care, Joint mobilization, Joint manipulation, Vestibular training, Canalith repositioning, Orthotic/Fit training, DME instructions, Dry Needling, Electrical stimulation, Spinal manipulation, Spinal mobilization, Cryotherapy, Moist heat, Taping, Traction, Ultrasound, Ionotophoresis 4mg /ml Dexamethasone, Manual  therapy, and Re-evaluation.  PLAN FOR NEXT SESSION: oculomotor/vestibular testing, progress balance and strength exercises, review/modify HEP as needed;    Sharalyn Ink Jacobi Ryant PT, DPT, GCS  Venia Riveron 11/26/2023, 10:12 AM

## 2023-11-28 ENCOUNTER — Ambulatory Visit: Payer: Medicare Other

## 2023-11-28 DIAGNOSIS — M6281 Muscle weakness (generalized): Secondary | ICD-10-CM

## 2023-11-28 DIAGNOSIS — R2681 Unsteadiness on feet: Secondary | ICD-10-CM

## 2023-11-28 NOTE — Therapy (Signed)
 OUTPATIENT PHYSICAL THERAPY BALANCE TREATMENT   Patient Name: Crystal Lynch MRN: [id number] DOB:1941-01-19, 83 y.o., female Today's Date: 11/28/2023  END OF SESSION:  PT End of Session - 11/28/23 0845     Visit Number 3    Number of Visits 17    Date for PT Re-Evaluation 01/14/24    Authorization Type eval: 11/19/23    PT Start Time 0846    PT Stop Time 0930    PT Time Calculation (min) 44 min    Equipment Utilized During Treatment Gait belt    Activity Tolerance Patient tolerated treatment well;No increased pain    Behavior During Therapy WFL for tasks assessed/performed            Past Medical History:  Diagnosis Date   Arthritis    Chicken pox    Endometriosis    Esophagitis    s/p esophageal dilatation   GERD (gastroesophageal reflux disease)    History of ovarian cyst    Hypercholesterolemia    Hypertension    Hypothyroidism    Past Surgical History:  Procedure Laterality Date   ABDOMINAL SURGERY     found to have an ovarian cyst and endometriosis   APPENDECTOMY     Patient Active Problem List   Diagnosis Date Noted   Bursitis of right hip 09/12/2023   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   Stress 07/27/2022   Unsteady gait 07/10/2022   Hypophosphatemia 06/11/2022   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   OSA (obstructive sleep apnea) 06/09/2022   Kidney lesion 12/14/2021   Left hip pain 12/13/2021   Memory change 11/27/2021   Fatigue 07/23/2021   Aortic atherosclerosis (HCC) 04/17/2021   Right acute serous otitis media    Hyponatremia 02/23/2021   Prolonged QT interval 02/23/2021   Dizziness 09/12/2020   Fall 03/08/2020   Fatty liver 12/28/2019   Chest pain 07/25/2019   Frequent urinary tract infections 12/24/2018   UTI (urinary tract infection) 10/11/2018   Hypercalcemia 01/09/2017   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   Sleeping difficulties 03/08/2015   Health care maintenance 01/10/2015   Osteopenia 08/10/2014    Hyperbilirubinemia 10/27/2012   Hematuria 10/27/2012   Hypertension 10/21/2012   Hypercholesterolemia 10/21/2012   Hypothyroidism 10/21/2012   Esophagitis 10/21/2012    PCP: Glendia Shad, MD  REFERRING PROVIDER: Glendia Shad, MD   REFERRING DIAG: R26.81 (ICD-10-CM) - Unsteady gait   RATIONALE FOR EVALUATION AND TREATMENT: Rehabilitation  THERAPY DIAG: Unsteadiness on feet  Muscle weakness (generalized)  ONSET DATE: Chronic x years  FOLLOW-UP APPT SCHEDULED WITH REFERRING PROVIDER: Yes   FROM INITIAL EVALUATION SUBJECTIVE:  SUBJECTIVE STATEMENT:  Unsteadiness  PERTINENT HISTORY:  Pt referred to PT for unsteadiness and dizziness. History today obtained from both patient and husband. I'm dizzy all the time. She denies vertigo but reports persistent unsteadiness with occasional lightheadedness. Pt has a history of NPH. She underwent a shunt trial at [facility] but there was not enough change in her testing to merit a permanent shunt placement. She is currently being followed by [facility] neurology and has a follow-up appointment this Wednesday. 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 She had a alpha-synuclein skin biopsy, EMG/NCS, echo, and CTA head and neck. 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 Recommended baby aspirin  and CTA 6 months. 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 No evidence of motor neuron disease.  Pt reports shuffling gait and freezing episodes but no tremor. She was started on carbidopa-levodopa but has not noticed any change in her symptoms since starting. She uses a rollator at home and front wheeled walker when out in the community. Pt wears eyeglasses all the time and has an upcoming check-up with optometry. History of occasional sinus  headaches but no history of migraines. Pt reports some L hip weakness.  Pain: Yes, L hip pain Numbness/Tingling: No Focal Weakness: Yes, L hip weakness related to pain; Recent changes in overall health/medication: Yes, carvidopa/levodopa Prior history of physical therapy for balance:  Yes Dominant hand: right Red flags: no personal history of cancer, abdominal pain, chills/fever, night sweats, nausea, vomiting, unrelenting pain  PRECAUTIONS: None  WEIGHT BEARING RESTRICTIONS: No  FALLS: Has patient fallen in last 6 months? Yes. Number of falls 7-8 , Directional pattern for falls: Yes Backwards  , often occurs when turning.   Living Environment Lives with: lives with their spouse Lives in: House/apartment Stairs: 2 to enter, railing on left during ascend Has following equipment at home: [facility] over commode, walk-in shower with seat and grab bars. Wheelchair for extended distances.   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   Hobbies: Enjoys crafting, reading.   Patient Goals: Build confidence in her balance   OBJECTIVE:   Patient Surveys  ABC: 40%  Cognition Patient is oriented to person, place, and time.  Recent memory is grossly intact however some memory deficit is evident. Remote memory is intact.  Attention span and concentration are intact.  Expressive speech is intact.  Patient's fund of knowledge is within normal limits for educational level.    Gross Musculoskeletal Assessment Tremor: None Bulk: Normal Tone: Normal  Posture: No gross abnormalities noted in standing or seated posture with the exception of some mild forward head.  AROM Deferred  LE MMT: MMT (out of 5) Right  Left   Hip flexion 5 4+  Hip extension    Hip abduction (seated) 4+ 4  Hip adduction 4+ 4  Hip internal rotation    Hip external rotation    Knee flexion (seated) 5 5  Knee extension 5 5   Ankle dorsiflexion 5 5  (* = pain; Blank rows = not tested)  Sensation Deferred  Reflexes Deferred  Cranial Nerves Deferred  Coordination/Cerebellar Finger to Nose: Mild dysmetria LUE Heel to Shin: Deferred Rapid alternating movements: Abnormal LUE Finger Opposition: WNL Pronator Drift: Negative  Bed mobility: Deferred  Transfers: Assistive device utilized: Environmental Consultant - 2 wheeled  Sit to stand: Modified independence Stand to sit: Modified independence Chair to chair: Modified independence Floor: Deferred  Curb:  Deferred  Stairs: Level of Assistance: Modified independence Stair Negotiation Technique: Step to Pattern with Bilateral Rails Number of Stairs: 4  Height of Stairs: 6  Comments: Pt moves slowly and leans backwards during descent  Gait: Gait pattern: step through pattern, decreased step length- Right, decreased step length- Left, and shuffling Distance walked: 100' Assistive device utilized: Environmental Consultant - 2 wheeled Level of assistance: Modified independence Comments: ataxic gait with shuffling steps;  mCTSIB: unable to complete, pt only able to stand for approximately 2-3s in condition 1;  Orthostatic vitals (11/26/23): Supine: BP: 151/66 mmHg, HR: 66 bpm, SpO2: 97% Seated: BP: 151/58 mmHg, HR: 68 bpm, SpO2: 97% Standing (1 minute): BP: 144/60 mmHg, HR: 71 bpm, SpO2: 98% Standing (3 minutes): BP: 149/63 mmHg, HR: 69 bpm, SpO2: 99%   BPPV TESTS (11/26/23):  Symptoms Duration Intensity Nystagmus  L Dix-Hallpike Dizziness/lightheadedness   None  R Dix-Hallpike Dizziness/lightheadedness   None  L Head Roll None   None  R Head Roll None   None  L Sidelying Test      R Sidelying Test      (blank = not tested)  Functional Outcome Measures  Results 11/26/23 Comments  BERG 19/56  High fall risk  DGI  8/24   FGA     TUG 19.3 seconds  Increased fall risk  5TSTS 19.7 seconds  Increased fall risk  6 Minute Walk Test     10 Meter Gait Speed Self-selected: 15.5s =  0.65 m/s; Fastest: 12.5s = 0.8 m/s  Below functional community ambulation speeds  (Blank rows = not tested)   TODAY'S TREATMENT    SUBJECTIVE: Pt reports that she is doing well today. No changes since the last therapy session but did have BLE soreness the following day and some bilateral hip soreness persisting today. However she denies overt pain currently. She would like to know if there are some bed exercises she can perform in the morning.    PAIN: Denies   Neuromuscular Re-education  NuStep L2-4 x 7 minutes at start of session for warm-up, BLE strengthening, and interval history; Alternating 6 step taps with faded UE support x 10 BLE; Feet apart static balance gradually progressing feet closer together eyes open/closed x 60s each; Feet apart static balance gradually progressing feet closer together horizontal and vertical head turns x 60s each; Feet apart heel/toe raises without UE support x 10 BLE;   Ther-ex  Alternating 6 step-ups with BUE support alternating leading LE x 10 BLE; Hooklying SLR hip flexion 2 x 10 BLE; Sidelying straight leg hip abduction 2 x 10 BLE; Hooklying bridges with arms across chest 2 x 10; Updated HEP with patient;   PATIENT EDUCATION:  Education details: Plan of care, examination findings, HEP Person educated: Patient and Spouse Education method: Explanation, Verbal cues, and Handouts Education comprehension: verbalized understanding, returned demonstration, and verbal cues required   HOME EXERCISE PROGRAM:  Access Code: [email or web address] URL: [email or web address] Date: 11/28/2023 Prepared by: Selinda Eck  Exercises - Standing Balance in Corner  - 1 x daily - 7 x weekly - 3 reps - 60s hold - Corner Balance Feet Together: Eyes Open With Head Turns  - 1 x daily - 7 x weekly - 3 reps - 60s hold - Supine Active Straight Leg Raise  - 1 x daily - 7 x weekly - 2 sets - 10 reps - 3s hold - Supine Bridge  - 1 x daily - 7 x weekly - 2 sets  - 10 reps - 3s hold -  Sidelying Hip Abduction  - 1 x daily - 7 x weekly - 2 sets - 10 reps - 3s hold   ASSESSMENT:  CLINICAL IMPRESSION: Performed additional balance and strengthening exercises during session today. Introduced some bed level exercises at patient's request and HEP updated. She does demonstrate slight improvement in unsupported standing balance today. Plan to progress balance and strength exercises at future sessions. Pt encouraged to follow-up as scheduled. Pt will benefit from PT services to address deficits in strength, balance, and mobility in order to return to full function at home and decrease her risk for falls.    OBJECTIVE IMPAIRMENTS: decreased balance and decreased strength.   ACTIVITY LIMITATIONS: standing, squatting, stairs, and transfers  PARTICIPATION LIMITATIONS: meal prep, cleaning, laundry, shopping, and community activity  PERSONAL FACTORS: Age, Past/current experiences, Time since onset of injury/illness/exacerbation, and 3+ comorbidities: memory change, fatigue, and NPH  are also affecting patient's functional outcome.   REHAB POTENTIAL: Fair    CLINICAL DECISION MAKING: Unstable/unpredictable  EVALUATION COMPLEXITY: High   GOALS: Goals reviewed with patient? Yes  SHORT TERM GOALS: Target date: 12/17/2023   Pt will be independent with HEP in order to improve strength and balance in order to decrease fall risk and improve function at home. Baseline:  Goal status: INITIAL   LONG TERM GOALS: Target date: 01/14/2024  1.  Pt will improve BERG by at least 3 points in order to demonstrate clinically significant improvement in balance.   Baseline: 19/56 Goal status: INITIAL  2.  Pt will improve ABC by at least 13% in order to demonstrate clinically significant improvement in balance confidence.      Baseline: 40% Goal status: INITIAL  3. Pt will decrease 5TSTS by at least 3 seconds in order to demonstrate clinically significant improvement in LE  strength      Baseline: 19.7s Goal status: INITIAL  4. Pt will decrease TUG to below 14 seconds/decrease in order to demonstrate decreased fall risk.  Baseline: 19.3s Goal status: INITIAL   PLAN: PT FREQUENCY: 2x/week  PT DURATION: 8 weeks  PLANNED INTERVENTIONS: Therapeutic exercises, Therapeutic activity, Neuromuscular re-education, Balance training, Gait training, Patient/Family education, Self Care, Joint mobilization, Joint manipulation, Vestibular training, Canalith repositioning, Orthotic/Fit training, DME instructions, Dry Needling, Electrical stimulation, Spinal manipulation, Spinal mobilization, Cryotherapy, Moist heat, Taping, Traction, Ultrasound, Ionotophoresis 4mg /ml Dexamethasone, Manual therapy, and Re-evaluation.  PLAN FOR NEXT SESSION: oculomotor/vestibular testing, progress balance and strength exercises, review/modify HEP as needed;    Ventura Leggitt D Yeray Tomas PT, DPT, GCS  Crystal Lynch 11/28/2023, 9:39 AM

## 2023-12-04 ENCOUNTER — Ambulatory Visit: Payer: Medicare Other

## 2023-12-04 DIAGNOSIS — R2681 Unsteadiness on feet: Secondary | ICD-10-CM | POA: Diagnosis not present

## 2023-12-04 DIAGNOSIS — R278 Other lack of coordination: Secondary | ICD-10-CM

## 2023-12-04 DIAGNOSIS — M6281 Muscle weakness (generalized): Secondary | ICD-10-CM

## 2023-12-04 DIAGNOSIS — R262 Difficulty in walking, not elsewhere classified: Secondary | ICD-10-CM

## 2023-12-04 NOTE — Therapy (Unsigned)
OUTPATIENT PHYSICAL THERAPY BALANCE TREATMENT   Patient Name: Crystal Lynch MRN: [id number] DOB:1941/06/07, 83 y.o., female Today's Date: 12/05/2023  END OF SESSION:  PT End of Session - 12/04/23 1617     Visit Number 4    Number of Visits 17    Date for PT Re-Evaluation 01/14/24    Authorization Type eval: 11/19/23    PT Start Time 1615    PT Stop Time 1700    PT Time Calculation (min) 45 min    Equipment Utilized During Treatment Gait belt    Activity Tolerance Patient tolerated treatment well;No increased pain    Behavior During Therapy WFL for tasks assessed/performed            Past Medical History:  Diagnosis Date   Arthritis    Chicken pox    Endometriosis    Esophagitis    s/p esophageal dilatation   GERD (gastroesophageal reflux disease)    History of ovarian cyst    Hypercholesterolemia    Hypertension    Hypothyroidism    Past Surgical History:  Procedure Laterality Date   ABDOMINAL SURGERY     found to have an ovarian cyst and endometriosis   APPENDECTOMY     Patient Active Problem List   Diagnosis Date Noted   Bursitis of right hip 09/12/2023   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   Stress 07/27/2022   Unsteady gait 07/10/2022   Hypophosphatemia 06/11/2022   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   OSA (obstructive sleep apnea) 06/09/2022   Kidney lesion 12/14/2021   Left hip pain 12/13/2021   Memory change 11/27/2021   Fatigue 07/23/2021   Aortic atherosclerosis (HCC) 04/17/2021   Right acute serous otitis media    Hyponatremia 02/23/2021   Prolonged QT interval 02/23/2021   Dizziness 09/12/2020   Fall 03/08/2020   Fatty liver 12/28/2019   Chest pain 07/25/2019   Frequent urinary tract infections 12/24/2018   UTI (urinary tract infection) 10/11/2018   Hypercalcemia 01/09/2017   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   Sleeping difficulties 03/08/2015   Health care maintenance 01/10/2015   Osteopenia 08/10/2014    Hyperbilirubinemia 10/27/2012   Hematuria 10/27/2012   Hypertension 10/21/2012   Hypercholesterolemia 10/21/2012   Hypothyroidism 10/21/2012   Esophagitis 10/21/2012    PCP: Dale Clifton, MD  REFERRING PROVIDER: Dale Sunman, MD   REFERRING DIAG: R26.81 (ICD-10-CM) - Unsteady gait   RATIONALE FOR EVALUATION AND TREATMENT: Rehabilitation  THERAPY DIAG: Unsteadiness on feet  Muscle weakness (generalized)  Other lack of coordination  ONSET DATE: Chronic x years  FOLLOW-UP APPT SCHEDULED WITH REFERRING PROVIDER: Yes   FROM INITIAL EVALUATION SUBJECTIVE:  SUBJECTIVE STATEMENT:  Unsteadiness  PERTINENT HISTORY:  Pt referred to PT for unsteadiness and dizziness. History today obtained from both patient and husband. "I'm dizzy all the time." She denies vertigo but reports persistent unsteadiness with occasional lightheadedness. Pt has a history of NPH. She underwent a shunt trial at [facility] but there was not enough change in her testing to merit a permanent shunt placement. She is currently being followed by [facility] neurology and has a follow-up appointment this Wednesday. 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 She had a alpha-synuclein skin biopsy, EMG/NCS, echo, and CTA head and neck. 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 Recommended baby aspirin and CTA 6 months. 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 No evidence of motor neuron disease.  Pt reports shuffling gait and freezing episodes but no tremor. She was started on carbidopa-levodopa but has not noticed any change in her symptoms since starting. She uses a rollator at home and front wheeled walker when out in the community. Pt wears eyeglasses all the time and has an upcoming check-up with optometry.  History of occasional sinus headaches but no history of migraines. Pt reports some L hip weakness.  Pain: Yes, L hip pain Numbness/Tingling: No Focal Weakness: Yes, L hip weakness related to pain; Recent changes in overall health/medication: Yes, carvidopa/levodopa Prior history of physical therapy for balance:  Yes Dominant hand: right Red flags: no personal history of cancer, abdominal pain, chills/fever, night sweats, nausea, vomiting, unrelenting pain  PRECAUTIONS: None  WEIGHT BEARING RESTRICTIONS: No  FALLS: Has patient fallen in last 6 months? Yes. Number of falls 7-8 , Directional pattern for falls: Yes Backwards  , often occurs when turning.   Living Environment Lives with: lives with their spouse Lives in: House/apartment Stairs: 2 to enter, railing on left during ascend Has following equipment at home: [facility] over commode, walk-in shower with seat and grab bars. Wheelchair for extended distances.   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   Hobbies: Enjoys crafting, reading.   Patient Goals: Build confidence in her balance   OBJECTIVE:   Patient Surveys  ABC: 40%  Cognition Patient is oriented to person, place, and time.  Recent memory is grossly intact however some memory deficit is evident. Remote memory is intact.  Attention span and concentration are intact.  Expressive speech is intact.  Patient's fund of knowledge is within normal limits for educational level.    Gross Musculoskeletal Assessment Tremor: None Bulk: Normal Tone: Normal  Posture: No gross abnormalities noted in standing or seated posture with the exception of some mild forward head.  AROM Deferred  LE MMT: MMT (out of 5) Right  Left   Hip flexion 5 4+  Hip extension    Hip abduction (seated) 4+ 4  Hip adduction 4+ 4  Hip internal rotation    Hip external rotation    Knee flexion (seated) 5 5   Knee extension 5 5  Ankle dorsiflexion 5 5  (* = pain; Blank rows = not tested)  Sensation Deferred  Reflexes Deferred  Cranial Nerves Deferred  Coordination/Cerebellar Finger to Nose: Mild dysmetria LUE Heel to Shin: Deferred Rapid alternating movements: Abnormal LUE Finger Opposition: WNL Pronator Drift: Negative  Bed mobility: Deferred  Transfers: Assistive device utilized: Environmental consultant - 2 wheeled  Sit to stand: Modified independence Stand to sit: Modified independence Chair to chair: Modified independence Floor: Deferred  Curb:  Deferred  Stairs: Level of Assistance: Modified independence Stair Negotiation Technique: Step to Pattern with Bilateral Rails Number of Stairs: 4  Height of Stairs: 6"  Comments: Pt moves slowly and leans backwards during descent  Gait: Gait pattern: step through pattern, decreased step length- Right, decreased step length- Left, and shuffling Distance walked: 100' Assistive device utilized: Environmental consultant - 2 wheeled Level of assistance: Modified independence Comments: ataxic gait with shuffling steps;  mCTSIB: unable to complete, pt only able to stand for approximately 2-3s in condition 1;  Orthostatic vitals (11/26/23): Supine: BP: 151/66 mmHg, HR: 66 bpm, SpO2: 97% Seated: BP: 151/58 mmHg, HR: 68 bpm, SpO2: 97% Standing (1 minute): BP: 144/60 mmHg, HR: 71 bpm, SpO2: 98% Standing (3 minutes): BP: 149/63 mmHg, HR: 69 bpm, SpO2: 99%   BPPV TESTS (11/26/23):  Symptoms Duration Intensity Nystagmus  L Dix-Hallpike Dizziness/lightheadedness   None  R Dix-Hallpike Dizziness/lightheadedness   None  L Head Roll None   None  R Head Roll None   None  L Sidelying Test      R Sidelying Test      (blank = not tested)  Functional Outcome Measures  Results 11/26/23 Comments  BERG 19/56  High fall risk  DGI  8/24   FGA     TUG 19.3 seconds  Increased fall risk  5TSTS 19.7 seconds  Increased fall risk  6 Minute Walk Test     10 Meter Gait Speed  Self-selected: 15.5s = 0.65 m/s; Fastest: 12.5s = 0.8 m/s  Below functional community ambulation speeds  (Blank rows = not tested)   TODAY'S TREATMENT    SUBJECTIVE: Pt reports that she is doing well today. No changes since the last therapy session but she did note difficulty with new HEP balance exercise in the corner with falling backwards. She denies overt pain currently. No questions or concerns upon arrival.   PAIN: Denies   Neuromuscular Re-education  Alternating 6" step taps with faded UE support x 10 BLE; Feet apart static balance gradually progressing feet closer together eyes open/closed x 60s each; Feet apart static balance gradually progressing feet closer together horizontal and vertical head turns x 60s each; Feet apart heel/toe raises without UE support x 15; Staggered balance on 6" step eyes open/closed x 30s each BLE; Blaze pods toe touch, 3 pods in front on ground, with random pattern 3 x 60s;    Ther-ex  Hooklying SLR hip flexion 2 x 10 BLE; Sidelying straight leg hip abduction 2 x 10 BLE; Hooklying bridges with arms across chest 2 x 10; Supine adductor ball squeeze 2 x 10;   Not today: Alternating 6" step-ups with BUE support alternating leading LE x 10 BLE; NuStep L2-4 x 7 minutes at start of session for warm-up, BLE strengthening, and interval history;   PATIENT EDUCATION:  Education details: Plan of care, examination findings, HEP Person educated: Patient and Spouse Education method: Explanation, Verbal cues, and Handouts Education comprehension: verbalized understanding, returned demonstration, and verbal cues required   HOME EXERCISE PROGRAM:  Access Code: [email or web address] URL: [email or web address] Date: 11/28/2023 Prepared by: Ria Comment  Exercises - Standing Balance in Corner  - 1 x daily - 7 x weekly - 3 reps - 60s hold - Corner Balance Feet Together: Eyes Open With Head Turns  - 1 x daily - 7 x weekly - 3 reps - 60s hold -  Supine Active Straight Leg Raise  - 1 x daily - 7 x weekly - 2 sets - 10 reps - 3s  hold - Supine Bridge  - 1 x daily - 7 x weekly - 2 sets - 10 reps - 3s hold - Sidelying Hip Abduction  - 1 x daily - 7 x weekly - 2 sets - 10 reps - 3s hold   ASSESSMENT:  CLINICAL IMPRESSION: Performed additional balance and strengthening exercises during session today. She does demonstrate slight improvement in unsupported standing balance today. Introduced Radiation protection practitioner during session today for motor coordination and balance. Plan to progress balance and strength exercises at future sessions. Pt encouraged to follow-up as scheduled. Pt will benefit from PT services to address deficits in strength, balance, and mobility in order to return to full function at home and decrease her risk for falls.    OBJECTIVE IMPAIRMENTS: decreased balance and decreased strength.   ACTIVITY LIMITATIONS: standing, squatting, stairs, and transfers  PARTICIPATION LIMITATIONS: meal prep, cleaning, laundry, shopping, and community activity  PERSONAL FACTORS: Age, Past/current experiences, Time since onset of injury/illness/exacerbation, and 3+ comorbidities: memory change, fatigue, and NPH  are also affecting patient's functional outcome.   REHAB POTENTIAL: Fair    CLINICAL DECISION MAKING: Unstable/unpredictable  EVALUATION COMPLEXITY: High   GOALS: Goals reviewed with patient? Yes  SHORT TERM GOALS: Target date: 12/17/2023   Pt will be independent with HEP in order to improve strength and balance in order to decrease fall risk and improve function at home. Baseline:  Goal status: INITIAL   LONG TERM GOALS: Target date: 01/14/2024  1.  Pt will improve BERG by at least 3 points in order to demonstrate clinically significant improvement in balance.   Baseline: 19/56 Goal status: INITIAL  2.  Pt will improve ABC by at least 13% in order to demonstrate clinically significant improvement in balance confidence.       Baseline: 40% Goal status: INITIAL  3. Pt will decrease 5TSTS by at least 3 seconds in order to demonstrate clinically significant improvement in LE strength      Baseline: 19.7s Goal status: INITIAL  4. Pt will decrease TUG to below 14 seconds/decrease in order to demonstrate decreased fall risk.  Baseline: 19.3s Goal status: INITIAL   PLAN: PT FREQUENCY: 2x/week  PT DURATION: 8 weeks  PLANNED INTERVENTIONS: Therapeutic exercises, Therapeutic activity, Neuromuscular re-education, Balance training, Gait training, Patient/Family education, Self Care, Joint mobilization, Joint manipulation, Vestibular training, Canalith repositioning, Orthotic/Fit training, DME instructions, Dry Needling, Electrical stimulation, Spinal manipulation, Spinal mobilization, Cryotherapy, Moist heat, Taping, Traction, Ultrasound, Ionotophoresis 4mg /ml Dexamethasone, Manual therapy, and Re-evaluation.  PLAN FOR NEXT SESSION: oculomotor/vestibular testing, progress balance and strength exercises, review/modify HEP as needed;   Sherri Sear, SPT [employer] [facility] Huprich PT, DPT, GCS  Huprich,Jason 12/05/2023, 11:11 AM

## 2023-12-07 ENCOUNTER — Ambulatory Visit: Payer: Medicare Other

## 2023-12-07 DIAGNOSIS — R278 Other lack of coordination: Secondary | ICD-10-CM

## 2023-12-07 DIAGNOSIS — R2681 Unsteadiness on feet: Secondary | ICD-10-CM

## 2023-12-07 DIAGNOSIS — M6281 Muscle weakness (generalized): Secondary | ICD-10-CM

## 2023-12-07 NOTE — Therapy (Signed)
OUTPATIENT PHYSICAL THERAPY BALANCE TREATMENT   Patient Name: Alyric Parkin MRN: [id number] DOB:July 20, 1941, 83 y.o., female Today's Date: 12/07/2023  END OF SESSION:  PT End of Session - 12/07/23 0933     Visit Number 5    Number of Visits 17    Date for PT Re-Evaluation 01/14/24    Authorization Type eval: 11/19/23    PT Start Time 0930    PT Stop Time 1015    PT Time Calculation (min) 45 min    Equipment Utilized During Treatment Gait belt    Activity Tolerance Patient tolerated treatment well;No increased pain    Behavior During Therapy WFL for tasks assessed/performed            Past Medical History:  Diagnosis Date   Arthritis    Chicken pox    Endometriosis    Esophagitis    s/p esophageal dilatation   GERD (gastroesophageal reflux disease)    History of ovarian cyst    Hypercholesterolemia    Hypertension    Hypothyroidism    Past Surgical History:  Procedure Laterality Date   ABDOMINAL SURGERY     found to have an ovarian cyst and endometriosis   APPENDECTOMY     Patient Active Problem List   Diagnosis Date Noted   Bursitis of right hip 09/12/2023   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   Stress 07/27/2022   Unsteady gait 07/10/2022   Hypophosphatemia 06/11/2022   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   OSA (obstructive sleep apnea) 06/09/2022   Kidney lesion 12/14/2021   Left hip pain 12/13/2021   Memory change 11/27/2021   Fatigue 07/23/2021   Aortic atherosclerosis (HCC) 04/17/2021   Right acute serous otitis media    Hyponatremia 02/23/2021   Prolonged QT interval 02/23/2021   Dizziness 09/12/2020   Fall 03/08/2020   Fatty liver 12/28/2019   Chest pain 07/25/2019   Frequent urinary tract infections 12/24/2018   UTI (urinary tract infection) 10/11/2018   Hypercalcemia 01/09/2017   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   Sleeping difficulties 03/08/2015   Health care maintenance 01/10/2015   Osteopenia 08/10/2014    Hyperbilirubinemia 10/27/2012   Hematuria 10/27/2012   Hypertension 10/21/2012   Hypercholesterolemia 10/21/2012   Hypothyroidism 10/21/2012   Esophagitis 10/21/2012    PCP: Dale [place], MD  REFERRING PROVIDER: Dale [place], MD   REFERRING DIAG: R26.81 (ICD-10-CM) - Unsteady gait   RATIONALE FOR EVALUATION AND TREATMENT: Rehabilitation  THERAPY DIAG: Unsteadiness on feet  Muscle weakness (generalized)  Other lack of coordination  ONSET DATE: Chronic x years  FOLLOW-UP APPT SCHEDULED WITH REFERRING PROVIDER: Yes   FROM INITIAL EVALUATION SUBJECTIVE:  SUBJECTIVE STATEMENT:  Unsteadiness  PERTINENT HISTORY:  Pt referred to PT for unsteadiness and dizziness. History today obtained from both patient and husband. "I'm dizzy all the time." She denies vertigo but reports persistent unsteadiness with occasional lightheadedness. Pt has a history of NPH. She underwent a shunt trial at [facility] but there was not enough change in her testing to merit a permanent shunt placement. She is currently being followed by [facility] neurology and has a follow-up appointment this Wednesday. 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 She had a alpha-synuclein skin biopsy, EMG/NCS, echo, and CTA head and neck. 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 Recommended baby aspirin and CTA 6 months. 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 No evidence of motor neuron disease.  Pt reports shuffling gait and freezing episodes but no tremor. She was started on carbidopa-levodopa but has not noticed any change in her symptoms since starting. She uses a rollator at home and front wheeled walker when out in the community. Pt wears eyeglasses all the time and has an upcoming check-up with optometry.  History of occasional sinus headaches but no history of migraines. Pt reports some L hip weakness.  Pain: Yes, L hip pain Numbness/Tingling: No Focal Weakness: Yes, L hip weakness related to pain; Recent changes in overall health/medication: Yes, carvidopa/levodopa Prior history of physical therapy for balance:  Yes Dominant hand: right Red flags: no personal history of cancer, abdominal pain, chills/fever, night sweats, nausea, vomiting, unrelenting pain  PRECAUTIONS: None  WEIGHT BEARING RESTRICTIONS: No  FALLS: Has patient fallen in last 6 months? Yes. Number of falls 7-8 , Directional pattern for falls: Yes Backwards  , often occurs when turning.   Living Environment Lives with: lives with their spouse Lives in: House/apartment Stairs: 2 to enter, railing on left during ascend Has following equipment at home: [facility] over commode, walk-in shower with seat and grab bars. Wheelchair for extended distances.   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   Hobbies: Enjoys crafting, reading.   Patient Goals: Build confidence in her balance   OBJECTIVE:   Patient Surveys  ABC: 40%  Cognition Patient is oriented to person, place, and time.  Recent memory is grossly intact however some memory deficit is evident. Remote memory is intact.  Attention span and concentration are intact.  Expressive speech is intact.  Patient's fund of knowledge is within normal limits for educational level.    Gross Musculoskeletal Assessment Tremor: None Bulk: Normal Tone: Normal  Posture: No gross abnormalities noted in standing or seated posture with the exception of some mild forward head.  AROM Deferred  LE MMT: MMT (out of 5) Right  Left   Hip flexion 5 4+  Hip extension    Hip abduction (seated) 4+ 4  Hip adduction 4+ 4  Hip internal rotation    Hip external rotation    Knee flexion (seated) 5 5   Knee extension 5 5  Ankle dorsiflexion 5 5  (* = pain; Blank rows = not tested)  Sensation Deferred  Reflexes Deferred  Cranial Nerves Deferred  Coordination/Cerebellar Finger to Nose: Mild dysmetria LUE Heel to Shin: Deferred Rapid alternating movements: Abnormal LUE Finger Opposition: WNL Pronator Drift: Negative  Bed mobility: Deferred  Transfers: Assistive device utilized: Environmental consultant - 2 wheeled  Sit to stand: Modified independence Stand to sit: Modified independence Chair to chair: Modified independence Floor: Deferred  Curb:  Deferred  Stairs: Level of Assistance: Modified independence Stair Negotiation Technique: Step to Pattern with Bilateral Rails Number of Stairs: 4  Height of Stairs: 6"  Comments: Pt moves slowly and leans backwards during descent  Gait: Gait pattern: step through pattern, decreased step length- Right, decreased step length- Left, and shuffling Distance walked: 100' Assistive device utilized: Environmental consultant - 2 wheeled Level of assistance: Modified independence Comments: ataxic gait with shuffling steps;  mCTSIB: unable to complete, pt only able to stand for approximately 2-3s in condition 1;  Orthostatic vitals (11/26/23): Supine: BP: 151/66 mmHg, HR: 66 bpm, SpO2: 97% Seated: BP: 151/58 mmHg, HR: 68 bpm, SpO2: 97% Standing (1 minute): BP: 144/60 mmHg, HR: 71 bpm, SpO2: 98% Standing (3 minutes): BP: 149/63 mmHg, HR: 69 bpm, SpO2: 99%   BPPV TESTS (11/26/23):  Symptoms Duration Intensity Nystagmus  L Dix-Hallpike Dizziness/lightheadedness   None  R Dix-Hallpike Dizziness/lightheadedness   None  L Head Roll None   None  R Head Roll None   None  L Sidelying Test      R Sidelying Test      (blank = not tested)  Functional Outcome Measures  Results 11/26/23 Comments  BERG 19/56  High fall risk  DGI  8/24   FGA     TUG 19.3 seconds  Increased fall risk  5TSTS 19.7 seconds  Increased fall risk  6 Minute Walk Test     10 Meter Gait Speed  Self-selected: 15.5s = 0.65 m/s; Fastest: 12.5s = 0.8 m/s  Below functional community ambulation speeds  (Blank rows = not tested)   TODAY'S TREATMENT   SUBJECTIVE: Pt reports that she is doing well today. No changes since the last therapy session but she did note difficulty with new HEP balance exercise in the corner with swaying backwards. She denies overt pain currently. No questions or concerns upon arrival.   PAIN: Denies   Neuromuscular Re-education  Alternating 6" step taps with faded UE support x 10 BLE; Feet apart static balance gradually progressing feet closer together eyes open/closed x 60s each; Feet apart static balance gradually progressing feet closer together horizontal and vertical head turns x 60s each; Staggered stance balance eyes open/closed x 30s each BLE; Staggered stance balance horizontal/vertical head turns x 30s each BLE; Forward gait in // bars with no UE support x multiple lengths; Blaze pods toe touch, 3 pods in front on ground, with random pattern 3 x 60s;  Blaze pods toe touch, 3 pods in front on ground while seated, random pattern 2 x 60s (completed during seated rest breaks);   Ther-ex  NuStep L1-2 x 8 minutes at start of session for warm-up, BLE strengthening, and interval history;   Not today: Alternating 6" step-ups with BUE support alternating leading LE x 10 BLE; Feet apart heel/toe raises without UE support x 15; Hooklying SLR hip flexion 2 x 10 BLE; Sidelying straight leg hip abduction 2 x 10 BLE; Hooklying bridges with arms across chest 2 x 10; Supine adductor ball squeeze 2 x 10; Staggered balance on 6" step eyes open/closed x 30s each BLE;   PATIENT EDUCATION:  Education details: Plan of care, examination findings, HEP Person educated: Patient and Spouse Education method: Explanation, Verbal cues, and Handouts Education comprehension: verbalized understanding, returned demonstration, and verbal cues required   HOME EXERCISE  PROGRAM:  Access Code: [email or web address] URL: [email or web address] Date: 11/28/2023 Prepared by: Ria Comment  Exercises - Standing Balance in Corner  - 1 x daily - 7 x weekly - 3  reps - 60s hold - Corner Balance Feet Together: Eyes Open With Head Turns  - 1 x daily - 7 x weekly - 3 reps - 60s hold - Supine Active Straight Leg Raise  - 1 x daily - 7 x weekly - 2 sets - 10 reps - 3s hold - Supine Bridge  - 1 x daily - 7 x weekly - 2 sets - 10 reps - 3s hold - Sidelying Hip Abduction  - 1 x daily - 7 x weekly - 2 sets - 10 reps - 3s hold   ASSESSMENT:  CLINICAL IMPRESSION: Performed additional balance exercises during session today. She does demonstrate improvement in unsupported standing balance today. Continued blaze pods during session today for motor coordination and balance. Plan to progress balance and strength exercises at future sessions. Pt encouraged to follow-up as scheduled. Pt will benefit from PT services to address deficits in strength, balance, and mobility in order to return to full function at home and decrease her risk for falls.    OBJECTIVE IMPAIRMENTS: decreased balance and decreased strength.   ACTIVITY LIMITATIONS: standing, squatting, stairs, and transfers  PARTICIPATION LIMITATIONS: meal prep, cleaning, laundry, shopping, and community activity  PERSONAL FACTORS: Age, Past/current experiences, Time since onset of injury/illness/exacerbation, and 3+ comorbidities: memory change, fatigue, and NPH  are also affecting patient's functional outcome.   REHAB POTENTIAL: Fair    CLINICAL DECISION MAKING: Unstable/unpredictable  EVALUATION COMPLEXITY: High   GOALS: Goals reviewed with patient? Yes  SHORT TERM GOALS: Target date: 12/17/2023   Pt will be independent with HEP in order to improve strength and balance in order to decrease fall risk and improve function at home. Baseline:  Goal status: INITIAL   LONG TERM GOALS: Target date: 01/14/2024  1.   Pt will improve BERG by at least 3 points in order to demonstrate clinically significant improvement in balance.   Baseline: 19/56 Goal status: INITIAL  2.  Pt will improve ABC by at least 13% in order to demonstrate clinically significant improvement in balance confidence.      Baseline: 40% Goal status: INITIAL  3. Pt will decrease 5TSTS by at least 3 seconds in order to demonstrate clinically significant improvement in LE strength      Baseline: 19.7s Goal status: INITIAL  4. Pt will decrease TUG to below 14 seconds/decrease in order to demonstrate decreased fall risk.  Baseline: 19.3s Goal status: INITIAL   PLAN: PT FREQUENCY: 2x/week  PT DURATION: 8 weeks  PLANNED INTERVENTIONS: Therapeutic exercises, Therapeutic activity, Neuromuscular re-education, Balance training, Gait training, Patient/Family education, Self Care, Joint mobilization, Joint manipulation, Vestibular training, Canalith repositioning, Orthotic/Fit training, DME instructions, Dry Needling, Electrical stimulation, Spinal manipulation, Spinal mobilization, Cryotherapy, Moist heat, Taping, Traction, Ultrasound, Ionotophoresis 4mg /ml Dexamethasone, Manual therapy, and Re-evaluation.  PLAN FOR NEXT SESSION: oculomotor/vestibular testing, progress balance and strength exercises, review/modify HEP as needed;   Sherri Sear, SPT [employer] [facility] Huprich PT, DPT, GCS  Huprich,Jason 12/07/2023, 11:22 AM

## 2023-12-10 ENCOUNTER — Ambulatory Visit
Admission: RE | Admit: 2023-12-10 | Discharge: 2023-12-10 | Disposition: A | Discharge status: Home / Self Care | Payer: Medicare Other | Attending: Family Medicine | Admitting: Family Medicine

## 2023-12-10 ENCOUNTER — Ambulatory Visit (INDEPENDENT_AMBULATORY_CARE_PROVIDER_SITE_OTHER): Payer: Medicare Other | Admitting: Family Medicine

## 2023-12-10 ENCOUNTER — Encounter: Payer: Self-pay | Admitting: Family Medicine

## 2023-12-10 ENCOUNTER — Ambulatory Visit: Payer: Self-pay | Admitting: Internal Medicine

## 2023-12-10 ENCOUNTER — Ambulatory Visit
Admission: RE | Admit: 2023-12-10 | Discharge: 2023-12-10 | Disposition: A | Discharge status: Home / Self Care | Payer: Medicare Other | Source: Ambulatory Visit | Attending: Family Medicine | Admitting: Family Medicine

## 2023-12-10 VITALS — BP 130/76 | HR 94 | Temp 98.2°F | Resp 16 | Ht 62.0 in | Wt 136.0 lb

## 2023-12-10 DIAGNOSIS — M545 Low back pain, unspecified: Secondary | ICD-10-CM | POA: Diagnosis present

## 2023-12-10 DIAGNOSIS — R0982 Postnasal drip: Secondary | ICD-10-CM | POA: Insufficient documentation

## 2023-12-10 MED ORDER — HYDROCODONE-ACETAMINOPHEN 5-325 MG PO TABS
1.0000 | ORAL_TABLET | Freq: Four times a day (QID) | ORAL | 0 refills | Status: DC | PRN
Start: 2023-12-10 — End: 2024-06-01

## 2023-12-10 MED ORDER — AZELASTINE HCL 0.1 % NA SOLN
2.0000 | Freq: Two times a day (BID) | NASAL | 1 refills | Status: AC
Start: 2023-12-10 — End: ?

## 2023-12-10 NOTE — Telephone Encounter (Signed)
Pt is scheduled to see Dr. Birdie Sons at [facility]

## 2023-12-10 NOTE — Assessment & Plan Note (Addendum)
Patient has acute onset midline lumbar spine tenderness and low back pain after a fall.  Given her injury and her midline spine tenderness we will proceed with imaging today.  We will treat pain with Vicodin 1 tablet every 6 hours as needed for pain.  If this is not beneficial she will let us know.  Initially I was going to prescribe tramadol although the EMR warned against this given her history of prolonged QT.  Advised on the risk of drowsiness with this medication.  If she is excessively drowsy she will discontinue use and let us know.

## 2023-12-10 NOTE — Assessment & Plan Note (Signed)
Possibly related to viral illness versus allergies.  We will treat with Astelin nasal spray 2 sprays each nostril twice daily.  If not improving she will let us know.

## 2023-12-10 NOTE — Patient Instructions (Addendum)
Nice to see you. We are going to treat your pain with Vicodin.  If it is not beneficial please let us know.  Please monitor for drowsiness with this. We will contact you with your x-ray results.  [address] [facility] [address], [place], [place] [address]

## 2023-12-10 NOTE — Telephone Encounter (Signed)
 Noted

## 2023-12-10 NOTE — Progress Notes (Signed)
Marikay Alar, MD Phone: [phone]  Crystal Lynch is a 83 y.o. female who presents today for same day visit.   Back pain: Patient reports a fall several days ago.  She has chronic dizziness and anytime she turns quickly she gets dizzy.  She was undressing after church and turned quickly and fell over onto her back.  She did not hit her head.  No loss of consciousness.  She had bruising along her right lower back.  She notes no numbness in her legs.  No change in chronic weakness in her legs.  No incontinence or radiation of the pain.  She took a hydrocodone from her husband which provided some benefit for her pain.  Patient reports the pain in her back is significant.  Patient denies history of seizure.  Postnasal drip: This has been going on a week.  She has some nasal discharge that is not green.  Mild cough at night.  No fevers.  Over-the-counter Robitussin has been tried.  Social History   Tobacco Use  Smoking Status Never  Smokeless Tobacco Never    Current Outpatient Medications on File Prior to Visit  Medication Sig Dispense Refill   acetaminophen (TYLENOL) 500 MG tablet Take 1 tablet (500 mg total) by mouth every 6 (six) hours as needed for mild pain (or Fever >/= 101). 30 tablet 0   amLODipine (NORVASC) 5 MG tablet Take 1 tablet (5 mg total) by mouth daily. 90 tablet 3   aspirin 81 MG EC tablet Take 1 tablet (81 mg total) by mouth daily. Swallow whole. 30 tablet 0   Biotin 5 MG CAPS Take 5,000 mcg by mouth daily.     carvedilol (COREG) 12.5 MG tablet Take 1 tablet (12.5 mg total) by mouth 2 (two) times daily with a meal. 180 tablet 3   dorzolamide-timolol (COSOPT) 22.3-6.8 MG/ML ophthalmic solution Place 1 drop into both eyes 2 (two) times daily.     levothyroxine (SYNTHROID) 75 MCG tablet Take 1 tablet (75 mcg total) by mouth daily. 90 tablet 3   lisinopril (ZESTRIL) 40 MG tablet Take 1 tablet (40 mg total) by mouth daily. 90 tablet 3   Multiple Vitamins-Minerals (ZINC  PO) Take 1 tablet by mouth daily.     pantoprazole (PROTONIX) 40 MG tablet TAKE 1 TABLET BY MOUTH EVERY DAY 90 tablet 3   rivastigmine (EXELON) 1.5 MG capsule Take 1 capsule (1.5 mg total) by mouth 2 (two) times daily. 180 capsule 3   rosuvastatin (CRESTOR) 10 MG tablet Take 1 tablet (10 mg total) by mouth daily. 90 tablet 3   sertraline (ZOLOFT) 25 MG tablet Take 1 tablet (25 mg total) by mouth daily. 90 tablet 1   timolol (TIMOPTIC) 0.5 % ophthalmic solution Place 1 drop into both eyes 2 (two) times daily.     Zinc 50 MG TABS Take by mouth.     No current facility-administered medications on file prior to visit.     ROS see history of present illness  Objective  Physical Exam Vitals:   12/10/23 1302  BP: 130/76  Pulse: 94  Resp: 16  Temp: 98.2 F (36.8 C)  SpO2: 97%    BP Readings from Last 3 Encounters:  12/10/23 130/76  10/04/23 128/68  06/29/23 132/72   Wt Readings from Last 3 Encounters:  12/10/23 136 lb (61.7 kg)  10/04/23 136 lb 6.4 oz (61.9 kg)  06/29/23 133 lb 9.6 oz (60.6 kg)    Physical Exam Constitutional:  General: She is not in acute distress.    Appearance: She is not diaphoretic.  Cardiovascular:     Rate and Rhythm: Normal rate and regular rhythm.     Heart sounds: Normal heart sounds.  Pulmonary:     Effort: Pulmonary effort is normal.     Breath sounds: Normal breath sounds.  Musculoskeletal:     Comments: There is midline lumbar spine tenderness and right-sided lumbar muscular back tenderness  Skin:    General: Skin is warm and dry.  Neurological:     Mental Status: She is alert.     Comments: 5/5 strength bilateral quads, hamstrings, plantarflexion, and dorsiflexion, sensation to light touch intact bilateral lower extremities      Assessment/Plan: Please see individual problem list.  Acute midline low back pain without sciatica Assessment & Plan: Patient has acute onset midline lumbar spine tenderness and low back pain after a  fall.  Given her injury and her midline spine tenderness we will proceed with imaging today.  We will treat pain with Vicodin 1 tablet every 6 hours as needed for pain.  If this is not beneficial she will let us know.  Initially I was going to prescribe tramadol although the EMR warned against this given her history of prolonged QT.  Advised on the risk of drowsiness with this medication.  If she is excessively drowsy she will discontinue use and let us know.  Orders: -     HYDROcodone-Acetaminophen; Take 1 tablet by mouth every 6 (six) hours as needed for moderate pain (pain score 4-6).  Dispense: 10 tablet; Refill: 0 -     DG Lumbar Spine Complete; Future  Postnasal drip Assessment & Plan: Possibly related to viral illness versus allergies.  We will treat with Astelin nasal spray 2 sprays each nostril twice daily.  If not improving she will let us know.  Orders: -     Azelastine HCl; Place 2 sprays into both nostrils 2 (two) times daily. Use in each nostril as directed  Dispense: 30 mL; Refill: 1    Return if symptoms worsen or fail to improve.   Marikay Alar, MD [facility]

## 2023-12-10 NOTE — Telephone Encounter (Signed)
Chief Complaint: Fall Symptoms: Fell yesterday, pain moderate to severe, mild SOB Frequency: constant  Pertinent Negatives: Patient denies open cuts or bruises  Disposition: [] ED /[] Urgent Care (no appt availability in office) / [x] Appointment(In office/virtual)/ []  [place] [facility]/ [] Home Care/ [] Refused Recommended Disposition /[] [place] [facility]/ []  Follow-up with PCP Additional Notes: Patient's daughter in law (signed DPR on file) stated the patient fell yesterday while changing her clothes she turned and felt dizzy causing her to fall. Patient states she hurt her back and side (unsure of which side) during the fall. Patient states she needs to be checked out for the pain because it is causing mild SOB. Care advice was given and patient has been scheduled for an appointment today in office.   Copied from CRM [id number]. Topic: Clinical - Red Word Triage >> Dec 10, 2023 10:16 AM Desma Mcgregor wrote: Red Word that prompted transfer to Nurse Triage: Larey Seat yesterday and having pain in back and side. Also experiencing shortness of breath. Wants to be seen by pcp or another dr to be seen. Reason for Disposition  MILD weakness (i.e., does not interfere with ability to work, go to school, normal activities)  (Exception: Mild weakness is a chronic symptom.)  Answer Assessment - Initial Assessment Questions 1. MECHANISM: "How did the fall happen?"     She was changing clothes yesterday turned and fell after losing her balance  2. DOMESTIC VIOLENCE AND ELDER ABUSE SCREENING: "Did you fall because someone pushed you or tried to hurt you?" If Yes, ask: "Are you safe now?"     No  3. ONSET: "When did the fall happen?" (e.g., minutes, hours, or days ago)     Yesterday 4. LOCATION: "What part of the body hit the ground?" (e.g., back, buttocks, head, hips, knees, hands, head, stomach)     Back and side  5. INJURY: "Did you hurt (injure) yourself when you fell?" If Yes, ask: "What did you injure? Tell  me more about this?" (e.g., body area; type of injury; pain severity)"     Back pain  6. PAIN: "Is there any pain?" If Yes, ask: "How bad is the pain?" (e.g., Scale 1-10; or mild,  moderate, severe)   - NONE (0): No pain   - MILD (1-3): Doesn't interfere with normal activities    - MODERATE (4-7): Interferes with normal activities or awakens from sleep    - SEVERE (8-10): Excruciating pain, unable to do any normal activities      Moderate-to-severe 7. SIZE: For cuts, bruises, or swelling, ask: "How large is it?" (e.g., inches or centimeters)      No cuts or bruises  9. OTHER SYMPTOMS: "Do you have any other symptoms?" (e.g., dizziness, fever, weakness; new onset or worsening).      Back and side discomfort  10. CAUSE: "What do you think caused the fall (or falling)?" (e.g., tripped, dizzy spell)       Felt dizzy  Protocols used: Falls and [facility]

## 2023-12-11 ENCOUNTER — Other Ambulatory Visit: Payer: Self-pay | Admitting: Family Medicine

## 2023-12-11 ENCOUNTER — Other Ambulatory Visit: Payer: Medicare Other

## 2023-12-11 ENCOUNTER — Ambulatory Visit (INDEPENDENT_AMBULATORY_CARE_PROVIDER_SITE_OTHER): Payer: Medicare Other

## 2023-12-11 ENCOUNTER — Telehealth: Payer: Self-pay

## 2023-12-11 ENCOUNTER — Ambulatory Visit: Payer: Medicare Other

## 2023-12-11 DIAGNOSIS — S2241XA Multiple fractures of ribs, right side, initial encounter for closed fracture: Secondary | ICD-10-CM

## 2023-12-11 NOTE — Telephone Encounter (Signed)
Copied from CRM [id number]. Topic: Clinical - Medication Question >> Dec 11, 2023 11:36 AM Deaijah H wrote: Reason for CRM: *no question* Patient husband called in to return call , relayed message  and stated she has not had any problems with Ibuprofen or Aleve in the past

## 2023-12-11 NOTE — Telephone Encounter (Signed)
 Noted

## 2023-12-13 ENCOUNTER — Ambulatory Visit: Payer: Medicare Other

## 2023-12-18 ENCOUNTER — Ambulatory Visit: Payer: Medicare Other

## 2023-12-25 ENCOUNTER — Ambulatory Visit: Payer: Medicare Other

## 2023-12-26 NOTE — Therapy (Signed)
 OUTPATIENT PHYSICAL THERAPY BALANCE TREATMENT   Patient Name: Crystal Lynch MRN: [id number] DOB:05-20-1941, 83 y.o., female Today's Date: 12/29/2023  END OF SESSION:  PT End of Session - 12/29/23 1116     Visit Number 6    Number of Visits 17    Date for PT Re-Evaluation 01/14/24    Authorization Type eval: 11/19/23    PT Start Time 0930    PT Stop Time 1015    PT Time Calculation (min) 45 min    Equipment Utilized During Treatment Gait belt    Activity Tolerance Patient tolerated treatment well;No increased pain    Behavior During Therapy WFL for tasks assessed/performed            Past Medical History:  Diagnosis Date   Arthritis    Chicken pox    Endometriosis    Esophagitis    s/p esophageal dilatation   GERD (gastroesophageal reflux disease)    History of ovarian cyst    Hypercholesterolemia    Hypertension    Hypothyroidism    Past Surgical History:  Procedure Laterality Date   ABDOMINAL SURGERY     found to have an ovarian cyst and endometriosis   APPENDECTOMY     Patient Active Problem List   Diagnosis Date Noted   Low back pain 12/10/2023   Postnasal drip 12/10/2023   Bursitis of right hip 09/12/2023   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   Stress 07/27/2022   Unsteady gait 07/10/2022   Hypophosphatemia 06/11/2022   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   OSA (obstructive sleep apnea) 06/09/2022   Kidney lesion 12/14/2021   Left hip pain 12/13/2021   Memory change 11/27/2021   Fatigue 07/23/2021   Aortic atherosclerosis (HCC) 04/17/2021   Right acute serous otitis media    Hyponatremia 02/23/2021   Prolonged QT interval 02/23/2021   Dizziness 09/12/2020   Fall 03/08/2020   Fatty liver 12/28/2019   Chest pain 07/25/2019   Frequent urinary tract infections 12/24/2018   UTI (urinary tract infection) 10/11/2018   Hypercalcemia 01/09/2017   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   Sleeping difficulties 03/08/2015   Health care  maintenance 01/10/2015   Osteopenia 08/10/2014   Hyperbilirubinemia 10/27/2012   Hematuria 10/27/2012   Hypertension 10/21/2012   Hypercholesterolemia 10/21/2012   Hypothyroidism 10/21/2012   Esophagitis 10/21/2012    PCP: Dale Caroline, MD  REFERRING PROVIDER: Dale , MD   REFERRING DIAG: R26.81 (ICD-10-CM) - Unsteady gait   RATIONALE FOR EVALUATION AND TREATMENT: Rehabilitation  THERAPY DIAG: Unsteadiness on feet  Muscle weakness (generalized)  ONSET DATE: Chronic x years  FOLLOW-UP APPT SCHEDULED WITH REFERRING PROVIDER: Yes   FROM INITIAL EVALUATION SUBJECTIVE:  SUBJECTIVE STATEMENT:  Unsteadiness  PERTINENT HISTORY:  Pt referred to PT for unsteadiness and dizziness. History today obtained from both patient and husband. "I'm dizzy all the time." She denies vertigo but reports persistent unsteadiness with occasional lightheadedness. Pt has a history of NPH. She underwent a shunt trial at [facility] but there was not enough change in her testing to merit a permanent shunt placement. She is currently being followed by [facility] neurology and has a follow-up appointment this Wednesday. 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 She had a alpha-synuclein skin biopsy, EMG/NCS, echo, and CTA head and neck. 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 Recommended baby aspirin and CTA 6 months. 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 No evidence of motor neuron disease.  Pt reports shuffling gait and freezing episodes but no tremor. She was started on carbidopa-levodopa but has not noticed any change in her symptoms since starting. She uses a rollator at home and front wheeled walker when out in the community. Pt wears eyeglasses all the time and has an upcoming  check-up with optometry. History of occasional sinus headaches but no history of migraines. Pt reports some L hip weakness.  Pain: Yes, L hip pain Numbness/Tingling: No Focal Weakness: Yes, L hip weakness related to pain; Recent changes in overall health/medication: Yes, carvidopa/levodopa Prior history of physical therapy for balance:  Yes Dominant hand: right Red flags: no personal history of cancer, abdominal pain, chills/fever, night sweats, nausea, vomiting, unrelenting pain  PRECAUTIONS: None  WEIGHT BEARING RESTRICTIONS: No  FALLS: Has patient fallen in last 6 months? Yes. Number of falls 7-8 , Directional pattern for falls: Yes Backwards  , often occurs when turning.   Living Environment Lives with: lives with their spouse Lives in: House/apartment Stairs: 2 to enter, railing on left during ascend Has following equipment at home: [facility] over commode, walk-in shower with seat and grab bars. Wheelchair for extended distances.   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   Hobbies: Enjoys crafting, reading.   Patient Goals: Build confidence in her balance   OBJECTIVE:   Patient Surveys  ABC: 40%  Cognition Patient is oriented to person, place, and time.  Recent memory is grossly intact however some memory deficit is evident. Remote memory is intact.  Attention span and concentration are intact.  Expressive speech is intact.  Patient's fund of knowledge is within normal limits for educational level.    Gross Musculoskeletal Assessment Tremor: None Bulk: Normal Tone: Normal  Posture: No gross abnormalities noted in standing or seated posture with the exception of some mild forward head.  AROM Deferred  LE MMT: MMT (out of 5) Right  Left   Hip flexion 5 4+  Hip extension    Hip abduction (seated) 4+ 4  Hip adduction 4+ 4  Hip internal rotation    Hip external rotation     Knee flexion (seated) 5 5  Knee extension 5 5  Ankle dorsiflexion 5 5  (* = pain; Blank rows = not tested)  Sensation Deferred  Reflexes Deferred  Cranial Nerves Deferred  Coordination/Cerebellar Finger to Nose: Mild dysmetria LUE Heel to Shin: Deferred Rapid alternating movements: Abnormal LUE Finger Opposition: WNL Pronator Drift: Negative  Bed mobility: Deferred  Transfers: Assistive device utilized: Environmental consultant - 2 wheeled  Sit to stand: Modified independence Stand to sit: Modified independence Chair to chair: Modified independence Floor: Deferred  Curb:  Deferred  Stairs: Level of Assistance: Modified independence Stair Negotiation Technique: Step to Pattern with Bilateral Rails Number of Stairs: 4  Height of Stairs: 6"  Comments: Pt moves slowly and leans backwards during descent  Gait: Gait pattern: step through pattern, decreased step length- Right, decreased step length- Left, and shuffling Distance walked: 100' Assistive device utilized: Environmental consultant - 2 wheeled Level of assistance: Modified independence Comments: ataxic gait with shuffling steps;  mCTSIB: unable to complete, pt only able to stand for approximately 2-3s in condition 1;  Orthostatic vitals (11/26/23): Supine: BP: 151/66 mmHg, HR: 66 bpm, SpO2: 97% Seated: BP: 151/58 mmHg, HR: 68 bpm, SpO2: 97% Standing (1 minute): BP: 144/60 mmHg, HR: 71 bpm, SpO2: 98% Standing (3 minutes): BP: 149/63 mmHg, HR: 69 bpm, SpO2: 99%   BPPV TESTS (11/26/23):  Symptoms Duration Intensity Nystagmus  L Dix-Hallpike Dizziness/lightheadedness   None  R Dix-Hallpike Dizziness/lightheadedness   None  L Head Roll None   None  R Head Roll None   None  L Sidelying Test      R Sidelying Test      (blank = not tested)  Functional Outcome Measures  Results 11/26/23 Comments  BERG 19/56  High fall risk  DGI  8/24   FGA     TUG 19.3 seconds  Increased fall risk  5TSTS 19.7 seconds  Increased fall risk  6 Minute Walk Test      10 Meter Gait Speed Self-selected: 15.5s = 0.65 m/s; Fastest: 12.5s = 0.8 m/s  Below functional community ambulation speeds  (Blank rows = not tested)   TODAY'S TREATMENT   SUBJECTIVE: Pt reports that she is doing alright today. She had a fall a little over 3 weeks ago. She was undressing after church and turned quickly and fell over onto her back. She did not hit her head.  No loss of consciousness.  She had bruising along her right lower back. She saw her PCP who ordered plain film radiographs which showed acute right lateral ninth and tenth rib fractures. Pt reports some ongoing mild soreness today. She had a consult with [facility] neurology (12/17/23) who wanted to message her cardiologist about a Holter monitor to rule out arrhythmias as a cause for her dizziness. Neurology felt that repeating her lumbar drain trial was low yield given that she did not improve during her assessment at [facility]. He did offer it to her as an option however pt is averse to any kind of surgery on the brain. He wanted to repeat a cervical and thoracic MRI and follow-up after these tests. She has not been able to perform her HEP over the last couple weeks. No specific questions or concerns upon arrival.   PAIN: Denies   Neuromuscular Re-education  Practiced ankle, knee, and hip strategies to maintain static standing balance with feet apart in // bars; Practiced forward gait in // bars without UE support x multiple lengths; Gait in rehab gym without UE support, added horizontal head turns; Side stepping in // bars wtihout UE support;    Ther-Activity NuStep L0-3 x 8 minutes at start of session for warm-up, BLE strengthening, and interval history;  Standing hip strengthening with 3# ankle weights (AW): Hip flexion marches 2 x 10 BLE; HS curls 2 x 10 BLE; Hip abduction 2 x 10 BLE; Hip extension 2 x 10 BLE;  Seated LAQ with 3# AW 2 x 10 BLE;  Standing heel raises 2 x 10;   Not today: Alternating 6" step-ups  with BUE support  alternating leading LE x 10 BLE; Feet apart heel/toe raises without UE support x 15; Hooklying SLR hip flexion 2 x 10 BLE; Sidelying straight leg hip abduction 2 x 10 BLE; Hooklying bridges with arms across chest 2 x 10; Supine adductor ball squeeze 2 x 10; Staggered balance on 6" step eyes open/closed x 30s each BLE; Alternating 6" step taps with faded UE support x 10 BLE; Feet apart static balance gradually progressing feet closer together eyes open/closed x 60s each; Feet apart static balance gradually progressing feet closer together horizontal and vertical head turns x 60s each; Staggered stance balance eyes open/closed x 30s each BLE; Staggered stance balance horizontal/vertical head turns x 30s each BLE; Forward gait in // bars with no UE support x multiple lengths; Blaze pods toe touch, 3 pods in front on ground, with random pattern 3 x 60s;  Blaze pods toe touch, 3 pods in front on ground while seated, random pattern 2 x 60s (completed during seated rest breaks);   PATIENT EDUCATION:  Education details: Plan of care, examination findings, HEP Person educated: Patient and Spouse Education method: Explanation, Verbal cues, and Handouts Education comprehension: verbalized understanding, returned demonstration, and verbal cues required   HOME EXERCISE PROGRAM:  Access Code: [email or web address] URL: [email or web address] Date: 11/28/2023 Prepared by: Ria Comment  Exercises - Standing Balance in Corner  - 1 x daily - 7 x weekly - 3 reps - 60s hold - Corner Balance Feet Together: Eyes Open With Head Turns  - 1 x daily - 7 x weekly - 3 reps - 60s hold - Supine Active Straight Leg Raise  - 1 x daily - 7 x weekly - 2 sets - 10 reps - 3s hold - Supine Bridge  - 1 x daily - 7 x weekly - 2 sets - 10 reps - 3s hold - Sidelying Hip Abduction  - 1 x daily - 7 x weekly - 2 sets - 10 reps - 3s hold   ASSESSMENT:  CLINICAL IMPRESSION: Performed additional balance  exercises during session today. She continues to demonstrate difficulty initiating movement with possible slight freezing episodes. Plan to progress balance and strength exercises at future sessions while incorporating cognitive tasks. Pt encouraged to follow-up as scheduled. Pt will benefit from PT services to address deficits in strength, balance, and mobility in order to return to full function at home and decrease her risk for falls.    OBJECTIVE IMPAIRMENTS: decreased balance and decreased strength.   ACTIVITY LIMITATIONS: standing, squatting, stairs, and transfers  PARTICIPATION LIMITATIONS: meal prep, cleaning, laundry, shopping, and community activity  PERSONAL FACTORS: Age, Past/current experiences, Time since onset of injury/illness/exacerbation, and 3+ comorbidities: memory change, fatigue, and NPH  are also affecting patient's functional outcome.   REHAB POTENTIAL: Fair    CLINICAL DECISION MAKING: Unstable/unpredictable  EVALUATION COMPLEXITY: High   GOALS: Goals reviewed with patient? Yes  SHORT TERM GOALS: Target date: 12/17/2023   Pt will be independent with HEP in order to improve strength and balance in order to decrease fall risk and improve function at home. Baseline:  Goal status: INITIAL   LONG TERM GOALS: Target date: 01/14/2024  1.  Pt will improve BERG by at least 3 points in order to demonstrate clinically significant improvement in balance.   Baseline: 19/56 Goal status: INITIAL  2.  Pt will improve ABC by at least 13% in order to demonstrate clinically significant improvement in balance confidence.      Baseline: 40% Goal status: INITIAL  3. Pt  will decrease 5TSTS by at least 3 seconds in order to demonstrate clinically significant improvement in LE strength      Baseline: 19.7s Goal status: INITIAL  4. Pt will decrease TUG to below 14 seconds/decrease in order to demonstrate decreased fall risk.  Baseline: 19.3s Goal status: INITIAL   PLAN: PT  FREQUENCY: 2x/week  PT DURATION: 8 weeks  PLANNED INTERVENTIONS: Therapeutic exercises, Therapeutic activity, Neuromuscular re-education, Balance training, Gait training, Patient/Family education, Self Care, Joint mobilization, Joint manipulation, Vestibular training, Canalith repositioning, Orthotic/Fit training, DME instructions, Dry Needling, Electrical stimulation, Spinal manipulation, Spinal mobilization, Cryotherapy, Moist heat, Taping, Traction, Ultrasound, Ionotophoresis 4mg /ml Dexamethasone, Manual therapy, and Re-evaluation.  PLAN FOR NEXT SESSION: added seated ankle weight exercises, oculomotor/vestibular testing, progress balance and strength exercises, review/modify HEP as needed;   Sharalyn Ink Miela Desjardin PT, DPT, GCS  Janavia Rottman 12/29/2023, 11:17 AM

## 2023-12-27 ENCOUNTER — Ambulatory Visit: Payer: Medicare Other | Attending: Internal Medicine

## 2023-12-27 DIAGNOSIS — R262 Difficulty in walking, not elsewhere classified: Secondary | ICD-10-CM | POA: Diagnosis present

## 2023-12-27 DIAGNOSIS — R2681 Unsteadiness on feet: Secondary | ICD-10-CM | POA: Diagnosis present

## 2023-12-27 DIAGNOSIS — M6281 Muscle weakness (generalized): Secondary | ICD-10-CM | POA: Diagnosis present

## 2023-12-27 DIAGNOSIS — M5459 Other low back pain: Secondary | ICD-10-CM | POA: Diagnosis present

## 2024-01-01 ENCOUNTER — Ambulatory Visit: Payer: Medicare Other

## 2024-01-01 DIAGNOSIS — M6281 Muscle weakness (generalized): Secondary | ICD-10-CM

## 2024-01-01 DIAGNOSIS — R2681 Unsteadiness on feet: Secondary | ICD-10-CM | POA: Diagnosis not present

## 2024-01-01 NOTE — Therapy (Signed)
 OUTPATIENT PHYSICAL THERAPY BALANCE TREATMENT   Patient Name: Crystal Lynch MRN: [id number] DOB:Apr 30, 1941, 83 y.o., female Today's Date: 01/01/2024  END OF SESSION:  PT End of Session - 01/01/24 1005     Visit Number 7    Number of Visits 17    Date for PT Re-Evaluation 01/14/24    Authorization Type eval: 11/19/23    PT Start Time 0847    PT Stop Time 0932    PT Time Calculation (min) 45 min    Equipment Utilized During Treatment Gait belt    Activity Tolerance Patient tolerated treatment well    Behavior During Therapy WFL for tasks assessed/performed             Past Medical History:  Diagnosis Date   Arthritis    Chicken pox    Endometriosis    Esophagitis    s/p esophageal dilatation   GERD (gastroesophageal reflux disease)    History of ovarian cyst    Hypercholesterolemia    Hypertension    Hypothyroidism    Past Surgical History:  Procedure Laterality Date   ABDOMINAL SURGERY     found to have an ovarian cyst and endometriosis   APPENDECTOMY     Patient Active Problem List   Diagnosis Date Noted   Low back pain 12/10/2023   Postnasal drip 12/10/2023   Bursitis of right hip 09/12/2023   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   Stress 07/27/2022   Unsteady gait 07/10/2022   Hypophosphatemia 06/11/2022   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   OSA (obstructive sleep apnea) 06/09/2022   Kidney lesion 12/14/2021   Left hip pain 12/13/2021   Memory change 11/27/2021   Fatigue 07/23/2021   Aortic atherosclerosis (HCC) 04/17/2021   Right acute serous otitis media    Hyponatremia 02/23/2021   Prolonged QT interval 02/23/2021   Dizziness 09/12/2020   Fall 03/08/2020   Fatty liver 12/28/2019   Chest pain 07/25/2019   Frequent urinary tract infections 12/24/2018   UTI (urinary tract infection) 10/11/2018   Hypercalcemia 01/09/2017   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   Sleeping difficulties 03/08/2015   Health care maintenance 01/10/2015    Osteopenia 08/10/2014   Hyperbilirubinemia 10/27/2012   Hematuria 10/27/2012   Hypertension 10/21/2012   Hypercholesterolemia 10/21/2012   Hypothyroidism 10/21/2012   Esophagitis 10/21/2012    PCP: Dale Poinsett, MD  REFERRING PROVIDER: Dale Captiva, MD   REFERRING DIAG: R26.81 (ICD-10-CM) - Unsteady gait   RATIONALE FOR EVALUATION AND TREATMENT: Rehabilitation  THERAPY DIAG: Unsteadiness on feet  Muscle weakness (generalized)  ONSET DATE: Chronic x years  FOLLOW-UP APPT SCHEDULED WITH REFERRING PROVIDER: Yes   FROM INITIAL EVALUATION SUBJECTIVE:  SUBJECTIVE STATEMENT:  Unsteadiness  PERTINENT HISTORY:  Pt referred to PT for unsteadiness and dizziness. History today obtained from both patient and husband. "I'm dizzy all the time." She denies vertigo but reports persistent unsteadiness with occasional lightheadedness. Pt has a history of NPH. She underwent a shunt trial at [facility] but there was not enough change in her testing to merit a permanent shunt placement. She is currently being followed by [facility] neurology and has a follow-up appointment this Wednesday. 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 She had a alpha-synuclein skin biopsy, EMG/NCS, echo, and CTA head and neck. 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 Recommended baby aspirin and CTA 6 months. 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 No evidence of motor neuron disease.  Pt reports shuffling gait and freezing episodes but no tremor. She was started on carbidopa-levodopa but has not noticed any change in her symptoms since starting. She uses a rollator at home and front wheeled walker when out in the community. Pt wears eyeglasses all the time and has an upcoming check-up with optometry.  History of occasional sinus headaches but no history of migraines. Pt reports some L hip weakness.  Pain: Yes, L hip pain Numbness/Tingling: No Focal Weakness: Yes, L hip weakness related to pain; Recent changes in overall health/medication: Yes, carvidopa/levodopa Prior history of physical therapy for balance:  Yes Dominant hand: right Red flags: no personal history of cancer, abdominal pain, chills/fever, night sweats, nausea, vomiting, unrelenting pain  PRECAUTIONS: None  WEIGHT BEARING RESTRICTIONS: No  FALLS: Has patient fallen in last 6 months? Yes. Number of falls 7-8 , Directional pattern for falls: Yes Backwards  , often occurs when turning.   Living Environment Lives with: lives with their spouse Lives in: House/apartment Stairs: 2 to enter, railing on left during ascend Has following equipment at home: [facility] over commode, walk-in shower with seat and grab bars. Wheelchair for extended distances.   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   Hobbies: Enjoys crafting, reading.   Patient Goals: Build confidence in her balance   OBJECTIVE:   Patient Surveys  ABC: 40%  Cognition Patient is oriented to person, place, and time.  Recent memory is grossly intact however some memory deficit is evident. Remote memory is intact.  Attention span and concentration are intact.  Expressive speech is intact.  Patient's fund of knowledge is within normal limits for educational level.    Gross Musculoskeletal Assessment Tremor: None Bulk: Normal Tone: Normal  Posture: No gross abnormalities noted in standing or seated posture with the exception of some mild forward head.  AROM Deferred  LE MMT: MMT (out of 5) Right  Left   Hip flexion 5 4+  Hip extension    Hip abduction (seated) 4+ 4  Hip adduction 4+ 4  Hip internal rotation    Hip external rotation    Knee flexion (seated) 5 5   Knee extension 5 5  Ankle dorsiflexion 5 5  (* = pain; Blank rows = not tested)  Sensation Deferred  Reflexes Deferred  Cranial Nerves Deferred  Coordination/Cerebellar Finger to Nose: Mild dysmetria LUE Heel to Shin: Deferred Rapid alternating movements: Abnormal LUE Finger Opposition: WNL Pronator Drift: Negative  Bed mobility: Deferred  Transfers: Assistive device utilized: Environmental consultant - 2 wheeled  Sit to stand: Modified independence Stand to sit: Modified independence Chair to chair: Modified independence Floor: Deferred  Curb:  Deferred  Stairs: Level of Assistance: Modified independence Stair Negotiation Technique: Step to Pattern with Bilateral Rails Number of Stairs: 4  Height of Stairs: 6"  Comments: Pt moves slowly and leans backwards during descent  Gait: Gait pattern: step through pattern, decreased step length- Right, decreased step length- Left, and shuffling Distance walked: 100' Assistive device utilized: Environmental consultant - 2 wheeled Level of assistance: Modified independence Comments: ataxic gait with shuffling steps;  mCTSIB: unable to complete, pt only able to stand for approximately 2-3s in condition 1;  Orthostatic vitals (11/26/23): Supine: BP: 151/66 mmHg, HR: 66 bpm, SpO2: 97% Seated: BP: 151/58 mmHg, HR: 68 bpm, SpO2: 97% Standing (1 minute): BP: 144/60 mmHg, HR: 71 bpm, SpO2: 98% Standing (3 minutes): BP: 149/63 mmHg, HR: 69 bpm, SpO2: 99%   BPPV TESTS (11/26/23):  Symptoms Duration Intensity Nystagmus  L Dix-Hallpike Dizziness/lightheadedness   None  R Dix-Hallpike Dizziness/lightheadedness   None  L Head Roll None   None  R Head Roll None   None  L Sidelying Test      R Sidelying Test      (blank = not tested)  Functional Outcome Measures  Results 11/26/23 Comments  BERG 19/56  High fall risk  DGI  8/24   FGA     TUG 19.3 seconds  Increased fall risk  5TSTS 19.7 seconds  Increased fall risk  6 Minute Walk Test     10 Meter Gait Speed  Self-selected: 15.5s = 0.65 m/s; Fastest: 12.5s = 0.8 m/s  Below functional community ambulation speeds  (Blank rows = not tested)   TODAY'S TREATMENT   SUBJECTIVE: Pt reports that she is doing alright today. No falls since the last therapy session and no health changes. No specific pain reported upon arrival today and no specific concerns.   PAIN: Denies   Neuromuscular Re-education  Alternating 6" step taps with faded UE support 2 x 10 BLE; Gait in rehab gym without UE support and incorporating serial 3 subtraction for dual task challenge. Pt struggles considerably with dual tasks; Feet together static balance practicing ankle, knee, and hip strategies to maintain static standing balance in // bars; Side stepping in // bars without UE support;  Backward gait in // bars without UE support; 6" hurlde steps in // bars without UE support;   Ther-Activity NuStep L0-4 x 10 minutes at start of session for warm-up, BLE strengthening, and interval history;  Sit to stand without UE support 2 x 10; Standing heel raises x 10 without UE support; Seated LAQ without UE support x 10 BLE; Seated clams with manual resistance from therapist x 10 BLE; Seated adductor squeezes with manual resistance from therapist x 10 BLE;   Not today: Alternating 6" step-ups with BUE support alternating leading LE x 10 BLE; Feet apart heel/toe raises without UE support x 15; Hooklying SLR hip flexion 2 x 10 BLE; Sidelying straight leg hip abduction 2 x 10 BLE; Hooklying bridges with arms across chest 2 x 10; Supine adductor ball squeeze 2 x 10; Staggered balance on 6" step eyes open/closed x 30s each BLE; Alternating 6" step taps with faded UE support x 10 BLE; Feet apart static balance gradually progressing feet closer together eyes open/closed x 60s each; Feet apart static balance gradually progressing feet closer together horizontal and vertical head turns x 60s each; Staggered stance balance eyes  open/closed x 30s each BLE; Staggered stance balance horizontal/vertical head turns x 30s each BLE; Blaze pods toe touch, 3 pods in front on  ground, with random pattern 3 x 60s;  Blaze pods toe touch, 3 pods in front on ground while seated, random pattern 2 x 60s (completed during seated rest breaks);   PATIENT EDUCATION:  Education details: Pt educated throughout session about proper posture and technique with exercises. Improved exercise technique, movement at target joints, use of target muscles after min to mod verbal, visual, tactile cues. Balance exercises Person educated: Patient Education method: Explanation and Verbal cues Education comprehension: verbalized understanding, returned demonstration, and verbal cues required   HOME EXERCISE PROGRAM:  Access Code: [email or web address] URL: [email or web address] Date: 11/28/2023 Prepared by: Ria Comment  Exercises - Standing Balance in Corner  - 1 x daily - 7 x weekly - 3 reps - 60s hold - Corner Balance Feet Together: Eyes Open With Head Turns  - 1 x daily - 7 x weekly - 3 reps - 60s hold - Supine Active Straight Leg Raise  - 1 x daily - 7 x weekly - 2 sets - 10 reps - 3s hold - Supine Bridge  - 1 x daily - 7 x weekly - 2 sets - 10 reps - 3s hold - Sidelying Hip Abduction  - 1 x daily - 7 x weekly - 2 sets - 10 reps - 3s hold   ASSESSMENT:  CLINICAL IMPRESSION: Performed additional balance exercises during session today. She continues to demonstrate difficulty initiating movement and significant difficulty with dual tasking. Progressed strengthening as well. Plan to progress balance and strength exercises at future sessions while incorporating cognitive tasks. Pt encouraged to follow-up as scheduled. Pt will benefit from PT services to address deficits in strength, balance, and mobility in order to return to full function at home and decrease her risk for falls.    OBJECTIVE IMPAIRMENTS: decreased balance and decreased  strength.   ACTIVITY LIMITATIONS: standing, squatting, stairs, and transfers  PARTICIPATION LIMITATIONS: meal prep, cleaning, laundry, shopping, and community activity  PERSONAL FACTORS: Age, Past/current experiences, Time since onset of injury/illness/exacerbation, and 3+ comorbidities: memory change, fatigue, and NPH  are also affecting patient's functional outcome.   REHAB POTENTIAL: Fair    CLINICAL DECISION MAKING: Unstable/unpredictable  EVALUATION COMPLEXITY: High   GOALS: Goals reviewed with patient? Yes  SHORT TERM GOALS: Target date: 12/17/2023   Pt will be independent with HEP in order to improve strength and balance in order to decrease fall risk and improve function at home. Baseline:  Goal status: INITIAL   LONG TERM GOALS: Target date: 01/14/2024  1.  Pt will improve BERG by at least 3 points in order to demonstrate clinically significant improvement in balance.   Baseline: 19/56 Goal status: INITIAL  2.  Pt will improve ABC by at least 13% in order to demonstrate clinically significant improvement in balance confidence.      Baseline: 40% Goal status: INITIAL  3. Pt will decrease 5TSTS by at least 3 seconds in order to demonstrate clinically significant improvement in LE strength      Baseline: 19.7s Goal status: INITIAL  4. Pt will decrease TUG to below 14 seconds/decrease in order to demonstrate decreased fall risk.  Baseline: 19.3s Goal status: INITIAL   PLAN: PT FREQUENCY: 2x/week  PT DURATION: 8 weeks  PLANNED INTERVENTIONS: Therapeutic exercises, Therapeutic activity, Neuromuscular re-education, Balance training, Gait training, Patient/Family education, Self Care, Joint mobilization, Joint manipulation, Vestibular training, Canalith repositioning, Orthotic/Fit training, DME instructions, Dry Needling, Electrical stimulation, Spinal manipulation, Spinal mobilization, Cryotherapy, Moist heat, Taping, Traction, Ultrasound, Ionotophoresis 4mg /ml  Dexamethasone, Manual therapy, and Re-evaluation.  PLAN FOR NEXT SESSION: added seated ankle weight exercises, oculomotor/vestibular testing, progress balance and strength exercises, review/modify HEP as needed;   Sharalyn Ink Riata Ikeda PT, DPT, GCS  Saheed Carrington 01/01/2024, 10:11 AM

## 2024-01-03 ENCOUNTER — Ambulatory Visit: Payer: Medicare Other

## 2024-01-03 DIAGNOSIS — M6281 Muscle weakness (generalized): Secondary | ICD-10-CM

## 2024-01-03 DIAGNOSIS — R2681 Unsteadiness on feet: Secondary | ICD-10-CM | POA: Diagnosis not present

## 2024-01-03 NOTE — Therapy (Signed)
 OUTPATIENT PHYSICAL THERAPY BALANCE TREATMENT   Patient Name: Crystal Lynch MRN: [id number] DOB:[birth year], 83 y.o., female Today's Date: 01/05/2024  END OF SESSION:  PT End of Session - 01/05/24 1500     Visit Number 8    Number of Visits 17    Date for PT Re-Evaluation 01/14/24    Authorization Type eval: 11/19/23    PT Start Time 0930    PT Stop Time 1015    PT Time Calculation (min) 45 min    Equipment Utilized During Treatment Gait belt    Activity Tolerance Patient tolerated treatment well    Behavior During Therapy WFL for tasks assessed/performed            Past Medical History:  Diagnosis Date   Arthritis    Chicken pox    Endometriosis    Esophagitis    s/p esophageal dilatation   GERD (gastroesophageal reflux disease)    History of ovarian cyst    Hypercholesterolemia    Hypertension    Hypothyroidism    Past Surgical History:  Procedure Laterality Date   ABDOMINAL SURGERY     found to have an ovarian cyst and endometriosis   APPENDECTOMY     Patient Active Problem List   Diagnosis Date Noted   Low back pain 12/10/2023   Postnasal drip 12/10/2023   Bursitis of right hip 09/12/2023   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   Stress 07/27/2022   Unsteady gait 07/10/2022   Hypophosphatemia 06/11/2022   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   OSA (obstructive sleep apnea) 06/09/2022   Kidney lesion 12/14/2021   Left hip pain 12/13/2021   Memory change 11/27/2021   Fatigue 07/23/2021   Aortic atherosclerosis (HCC) 04/17/2021   Right acute serous otitis media    Hyponatremia 02/23/2021   Prolonged QT interval 02/23/2021   Dizziness 09/12/2020   Fall 03/08/2020   Fatty liver 12/28/2019   Chest pain 07/25/2019   Frequent urinary tract infections 12/24/2018   UTI (urinary tract infection) 10/11/2018   Hypercalcemia 01/09/2017   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   Sleeping difficulties 03/08/2015   Health care maintenance 01/10/2015    Osteopenia 08/10/2014   Hyperbilirubinemia 10/27/2012   Hematuria 10/27/2012   Hypertension 10/21/2012   Hypercholesterolemia 10/21/2012   Hypothyroidism 10/21/2012   Esophagitis 10/21/2012    PCP: Dale [place], MD  REFERRING PROVIDER: Dale , MD   REFERRING DIAG: R26.81 (ICD-10-CM) - Unsteady gait   RATIONALE FOR EVALUATION AND TREATMENT: Rehabilitation  THERAPY DIAG: Unsteadiness on feet  Muscle weakness (generalized)  ONSET DATE: Chronic x years  FOLLOW-UP APPT SCHEDULED WITH REFERRING PROVIDER: Yes   FROM INITIAL EVALUATION SUBJECTIVE:  SUBJECTIVE STATEMENT:  Unsteadiness  PERTINENT HISTORY:  Pt referred to PT for unsteadiness and dizziness. History today obtained from both patient and husband. "I'm dizzy all the time." She denies vertigo but reports persistent unsteadiness with occasional lightheadedness. Pt has a history of NPH. She underwent a shunt trial at [facility] but there was not enough change in her testing to merit a permanent shunt placement. She is currently being followed by [facility] neurology and has a follow-up appointment this Wednesday. 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 She had a alpha-synuclein skin biopsy, EMG/NCS, echo, and CTA head and neck. 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 Recommended baby aspirin and CTA 6 months. 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 No evidence of motor neuron disease.  Pt reports shuffling gait and freezing episodes but no tremor. She was started on carbidopa-levodopa but has not noticed any change in her symptoms since starting. She uses a rollator at home and front wheeled walker when out in the community. Pt wears eyeglasses all the time and has an upcoming check-up with optometry.  History of occasional sinus headaches but no history of migraines. Pt reports some L hip weakness.  Pain: Yes, L hip pain Numbness/Tingling: No Focal Weakness: Yes, L hip weakness related to pain; Recent changes in overall health/medication: Yes, carvidopa/levodopa Prior history of physical therapy for balance:  Yes Dominant hand: right Red flags: no personal history of cancer, abdominal pain, chills/fever, night sweats, nausea, vomiting, unrelenting pain  PRECAUTIONS: None  WEIGHT BEARING RESTRICTIONS: No  FALLS: Has patient fallen in last 6 months? Yes. Number of falls 7-8 , Directional pattern for falls: Yes Backwards  , often occurs when turning.   Living Environment Lives with: lives with their spouse Lives in: House/apartment Stairs: 2 to enter, railing on left during ascend Has following equipment at home: [facility] over commode, walk-in shower with seat and grab bars. Wheelchair for extended distances.   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   Hobbies: Enjoys crafting, reading.   Patient Goals: Build confidence in her balance   OBJECTIVE:   Patient Surveys  ABC: 40%  Cognition Patient is oriented to person, place, and time.  Recent memory is grossly intact however some memory deficit is evident. Remote memory is intact.  Attention span and concentration are intact.  Expressive speech is intact.  Patient's fund of knowledge is within normal limits for educational level.    Gross Musculoskeletal Assessment Tremor: None Bulk: Normal Tone: Normal  Posture: No gross abnormalities noted in standing or seated posture with the exception of some mild forward head.  AROM Deferred  LE MMT: MMT (out of 5) Right  Left   Hip flexion 5 4+  Hip extension    Hip abduction (seated) 4+ 4  Hip adduction 4+ 4  Hip internal rotation    Hip external rotation    Knee flexion (seated) 5 5   Knee extension 5 5  Ankle dorsiflexion 5 5  (* = pain; Blank rows = not tested)  Sensation Deferred  Reflexes Deferred  Cranial Nerves Deferred  Coordination/Cerebellar Finger to Nose: Mild dysmetria LUE Heel to Shin: Deferred Rapid alternating movements: Abnormal LUE Finger Opposition: WNL Pronator Drift: Negative  Bed mobility: Deferred  Transfers: Assistive device utilized: Environmental consultant - 2 wheeled  Sit to stand: Modified independence Stand to sit: Modified independence Chair to chair: Modified independence Floor: Deferred  Curb:  Deferred  Stairs: Level of Assistance: Modified independence Stair Negotiation Technique: Step to Pattern with Bilateral Rails Number of Stairs: 4  Height of Stairs: 6"  Comments: Pt moves slowly and leans backwards during descent  Gait: Gait pattern: step through pattern, decreased step length- Right, decreased step length- Left, and shuffling Distance walked: 100' Assistive device utilized: Environmental consultant - 2 wheeled Level of assistance: Modified independence Comments: ataxic gait with shuffling steps;  mCTSIB: unable to complete, pt only able to stand for approximately 2-3s in condition 1;  Orthostatic vitals (11/26/23): Supine: BP: 151/66 mmHg, HR: 66 bpm, SpO2: 97% Seated: BP: 151/58 mmHg, HR: 68 bpm, SpO2: 97% Standing (1 minute): BP: 144/60 mmHg, HR: 71 bpm, SpO2: 98% Standing (3 minutes): BP: 149/63 mmHg, HR: 69 bpm, SpO2: 99%   BPPV TESTS (11/26/23):  Symptoms Duration Intensity Nystagmus  L Dix-Hallpike Dizziness/lightheadedness   None  R Dix-Hallpike Dizziness/lightheadedness   None  L Head Roll None   None  R Head Roll None   None  L Sidelying Test      R Sidelying Test      (blank = not tested)  Functional Outcome Measures  Results 11/26/23 Comments  BERG 19/56  High fall risk  DGI  8/24   FGA     TUG 19.3 seconds  Increased fall risk  5TSTS 19.7 seconds  Increased fall risk  6 Minute Walk Test     10 Meter Gait Speed  Self-selected: 15.5s = 0.65 m/s; Fastest: 12.5s = 0.8 m/s  Below functional community ambulation speeds  (Blank rows = not tested)   TODAY'S TREATMENT   SUBJECTIVE: Pt reports that she is doing alright today. No falls since the last therapy session and no health changes. No specific pain reported upon arrival today and no specific concerns.   PAIN: Denies   Neuromuscular Re-education  Alternating 6" step taps with faded UE support 2 x 10 BLE; Static balance with front foot on 6" step; Airex feet apart static balance eyes open/closed Airex feet apart horizontal and vertical head turns; Resisted gait with double black tband and faded UE support forward and backward x 4 each direction;   Ther-Activity NuStep L0-4 (seat 7, arms 9) x 10 minutes at start of session for warm-up, BLE strengthening, and interval history;  Sit to stand with hands on knees 2 x 10; Standing heel raises x 10 without UE support; Seated LAQ without UE support x 10 BLE; Seated clams with manual resistance from therapist x 10 BLE; Seated adductor squeezes with manual resistance from therapist x 10 BLE;   Not today: Alternating 6" step-ups with BUE support alternating leading LE x 10 BLE; Feet apart heel/toe raises without UE support x 15; Hooklying SLR hip flexion 2 x 10 BLE; Sidelying straight leg hip abduction 2 x 10 BLE; Hooklying bridges with arms across chest 2 x 10; Supine adductor ball squeeze 2 x 10; Staggered balance on 6" step eyes open/closed x 30s each BLE; Alternating 6" step taps with faded UE support x 10 BLE; Feet apart static balance gradually progressing feet closer together eyes open/closed x 60s each; Feet apart static balance gradually progressing feet closer together horizontal and vertical head turns x 60s each; Staggered stance balance eyes open/closed x 30s each BLE; Staggered stance balance horizontal/vertical head turns x 30s each BLE; Blaze pods toe touch, 3 pods in front on  ground, with random pattern 3 x 60s;  Blaze pods toe touch, 3 pods in front on ground while seated, random  pattern 2 x 60s (completed during seated rest breaks);   PATIENT EDUCATION:  Education details: Pt educated throughout session about proper posture and technique with exercises. Improved exercise technique, movement at target joints, use of target muscles after min to mod verbal, visual, tactile cues. Balance exercises Person educated: Patient Education method: Explanation and Verbal cues Education comprehension: verbalized understanding, returned demonstration, and verbal cues required   HOME EXERCISE PROGRAM:  Access Code: [email or web address] URL: [email or web address] Date: 11/28/2023 Prepared by: Ria Comment  Exercises - Standing Balance in Corner  - 1 x daily - 7 x weekly - 3 reps - 60s hold - Corner Balance Feet Together: Eyes Open With Head Turns  - 1 x daily - 7 x weekly - 3 reps - 60s hold - Supine Active Straight Leg Raise  - 1 x daily - 7 x weekly - 2 sets - 10 reps - 3s hold - Supine Bridge  - 1 x daily - 7 x weekly - 2 sets - 10 reps - 3s hold - Sidelying Hip Abduction  - 1 x daily - 7 x weekly - 2 sets - 10 reps - 3s hold   ASSESSMENT:  CLINICAL IMPRESSION: Performed additional balance exercises during session today. She continues to demonstrate difficulty initiating movement and significant difficulty with dual tasking. Progressed strengthening as well. Plan to progress balance and strength exercises at future sessions while incorporating cognitive tasks. Pt encouraged to follow-up as scheduled. Pt will benefit from PT services to address deficits in strength, balance, and mobility in order to return to full function at home and decrease her risk for falls.    OBJECTIVE IMPAIRMENTS: decreased balance and decreased strength.   ACTIVITY LIMITATIONS: standing, squatting, stairs, and transfers  PARTICIPATION LIMITATIONS: meal prep, cleaning, laundry, shopping,  and community activity  PERSONAL FACTORS: Age, Past/current experiences, Time since onset of injury/illness/exacerbation, and 3+ comorbidities: memory change, fatigue, and NPH  are also affecting patient's functional outcome.   REHAB POTENTIAL: Fair    CLINICAL DECISION MAKING: Unstable/unpredictable  EVALUATION COMPLEXITY: High   GOALS: Goals reviewed with patient? Yes  SHORT TERM GOALS: Target date: 12/17/2023   Pt will be independent with HEP in order to improve strength and balance in order to decrease fall risk and improve function at home. Baseline:  Goal status: INITIAL   LONG TERM GOALS: Target date: 01/14/2024  1.  Pt will improve BERG by at least 3 points in order to demonstrate clinically significant improvement in balance.   Baseline: 19/56 Goal status: INITIAL  2.  Pt will improve ABC by at least 13% in order to demonstrate clinically significant improvement in balance confidence.      Baseline: 40% Goal status: INITIAL  3. Pt will decrease 5TSTS by at least 3 seconds in order to demonstrate clinically significant improvement in LE strength      Baseline: 19.7s Goal status: INITIAL  4. Pt will decrease TUG to below 14 seconds/decrease in order to demonstrate decreased fall risk.  Baseline: 19.3s Goal status: INITIAL   PLAN: PT FREQUENCY: 2x/week  PT DURATION: 8 weeks  PLANNED INTERVENTIONS: Therapeutic exercises, Therapeutic activity, Neuromuscular re-education, Balance training, Gait training, Patient/Family education, Self Care, Joint mobilization, Joint manipulation, Vestibular training, Canalith repositioning, Orthotic/Fit training, DME instructions, Dry Needling, Electrical stimulation, Spinal manipulation, Spinal mobilization, Cryotherapy, Moist heat, Taping, Traction, Ultrasound, Ionotophoresis 4mg /ml Dexamethasone, Manual therapy, and Re-evaluation.  PLAN FOR NEXT SESSION: added seated ankle weight exercises, oculomotor/vestibular testing, progress  balance and strength exercises, review/modify HEP as needed;  Sharalyn Ink Asuzena Weis PT, DPT, GCS  Yaiden Yang 01/05/2024, 3:01 PM

## 2024-01-08 ENCOUNTER — Ambulatory Visit: Payer: Medicare Other

## 2024-01-08 DIAGNOSIS — R2681 Unsteadiness on feet: Secondary | ICD-10-CM | POA: Diagnosis not present

## 2024-01-08 DIAGNOSIS — M6281 Muscle weakness (generalized): Secondary | ICD-10-CM

## 2024-01-08 DIAGNOSIS — R262 Difficulty in walking, not elsewhere classified: Secondary | ICD-10-CM

## 2024-01-08 DIAGNOSIS — M5459 Other low back pain: Secondary | ICD-10-CM

## 2024-01-08 NOTE — Therapy (Signed)
 OUTPATIENT PHYSICAL THERAPY BALANCE TREATMENT   Patient Name: Crystal Lynch MRN: [id number] DOB:1941-01-17, 83 y.o., female Today's Date: 01/08/2024  END OF SESSION:  PT End of Session - 01/08/24 0931     Visit Number 9    Number of Visits 17    Date for PT Re-Evaluation 01/14/24    Authorization Type eval: 11/19/23    PT Start Time 0931    PT Stop Time 1015    PT Time Calculation (min) 44 min    Equipment Utilized During Treatment Gait belt    Activity Tolerance Patient tolerated treatment well    Behavior During Therapy WFL for tasks assessed/performed             Past Medical History:  Diagnosis Date   Arthritis    Chicken pox    Endometriosis    Esophagitis    s/p esophageal dilatation   GERD (gastroesophageal reflux disease)    History of ovarian cyst    Hypercholesterolemia    Hypertension    Hypothyroidism    Past Surgical History:  Procedure Laterality Date   ABDOMINAL SURGERY     found to have an ovarian cyst and endometriosis   APPENDECTOMY     Patient Active Problem List   Diagnosis Date Noted   Low back pain 12/10/2023   Postnasal drip 12/10/2023   Bursitis of right hip 09/12/2023   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   Stress 07/27/2022   Unsteady gait 07/10/2022   Hypophosphatemia 06/11/2022   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   OSA (obstructive sleep apnea) 06/09/2022   Kidney lesion 12/14/2021   Left hip pain 12/13/2021   Memory change 11/27/2021   Fatigue 07/23/2021   Aortic atherosclerosis (HCC) 04/17/2021   Right acute serous otitis media    Hyponatremia 02/23/2021   Prolonged QT interval 02/23/2021   Dizziness 09/12/2020   Fall 03/08/2020   Fatty liver 12/28/2019   Chest pain 07/25/2019   Frequent urinary tract infections 12/24/2018   UTI (urinary tract infection) 10/11/2018   Hypercalcemia 01/09/2017   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   Sleeping difficulties 03/08/2015   Health care maintenance 01/10/2015    Osteopenia 08/10/2014   Hyperbilirubinemia 10/27/2012   Hematuria 10/27/2012   Hypertension 10/21/2012   Hypercholesterolemia 10/21/2012   Hypothyroidism 10/21/2012   Esophagitis 10/21/2012    PCP: Dale Hand, MD  REFERRING PROVIDER: Dale LaPlace, MD   REFERRING DIAG: R26.81 (ICD-10-CM) - Unsteady gait   RATIONALE FOR EVALUATION AND TREATMENT: Rehabilitation  THERAPY DIAG: Difficulty in walking, not elsewhere classified  Muscle weakness (generalized)  Unsteadiness on feet  Other low back pain  ONSET DATE: Chronic x years  FOLLOW-UP APPT SCHEDULED WITH REFERRING PROVIDER: Yes   FROM INITIAL EVALUATION SUBJECTIVE:  SUBJECTIVE STATEMENT:  Unsteadiness  PERTINENT HISTORY:  Pt referred to PT for unsteadiness and dizziness. History today obtained from both patient and husband. "I'm dizzy all the time." She denies vertigo but reports persistent unsteadiness with occasional lightheadedness. Pt has a history of NPH. She underwent a shunt trial at [facility] but there was not enough change in her testing to merit a permanent shunt placement. She is currently being followed by [facility] neurology and has a follow-up appointment this Wednesday. 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 She had a alpha-synuclein skin biopsy, EMG/NCS, echo, and CTA head and neck. 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 Recommended baby aspirin and CTA 6 months. 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 No evidence of motor neuron disease.  Pt reports shuffling gait and freezing episodes but no tremor. She was started on carbidopa-levodopa but has not noticed any change in her symptoms since starting. She uses a rollator at home and front wheeled walker when out in the community. Pt wears  eyeglasses all the time and has an upcoming check-up with optometry. History of occasional sinus headaches but no history of migraines. Pt reports some L hip weakness.  Pain: Yes, L hip pain Numbness/Tingling: No Focal Weakness: Yes, L hip weakness related to pain; Recent changes in overall health/medication: Yes, carvidopa/levodopa Prior history of physical therapy for balance:  Yes Dominant hand: right Red flags: no personal history of cancer, abdominal pain, chills/fever, night sweats, nausea, vomiting, unrelenting pain  PRECAUTIONS: None  WEIGHT BEARING RESTRICTIONS: No  FALLS: Has patient fallen in last 6 months? Yes. Number of falls 7-8 , Directional pattern for falls: Yes Backwards  , often occurs when turning.   Living Environment Lives with: lives with their spouse Lives in: House/apartment Stairs: 2 to enter, railing on left during ascend Has following equipment at home: [facility] over commode, walk-in shower with seat and grab bars. Wheelchair for extended distances.   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   Hobbies: Enjoys crafting, reading.   Patient Goals: Build confidence in her balance   OBJECTIVE:   Patient Surveys  ABC: 40%  Cognition Patient is oriented to person, place, and time.  Recent memory is grossly intact however some memory deficit is evident. Remote memory is intact.  Attention span and concentration are intact.  Expressive speech is intact.  Patient's fund of knowledge is within normal limits for educational level.    Gross Musculoskeletal Assessment Tremor: None Bulk: Normal Tone: Normal  Posture: No gross abnormalities noted in standing or seated posture with the exception of some mild forward head.  AROM Deferred  LE MMT: MMT (out of 5) Right  Left   Hip flexion 5 4+  Hip extension    Hip abduction (seated) 4+ 4  Hip adduction 4+ 4  Hip  internal rotation    Hip external rotation    Knee flexion (seated) 5 5  Knee extension 5 5  Ankle dorsiflexion 5 5  (* = pain; Blank rows = not tested)  Sensation Deferred  Reflexes Deferred  Cranial Nerves Deferred  Coordination/Cerebellar Finger to Nose: Mild dysmetria LUE Heel to Shin: Deferred Rapid alternating movements: Abnormal LUE Finger Opposition: WNL Pronator Drift: Negative  Bed mobility: Deferred  Transfers: Assistive device utilized: Environmental consultant - 2 wheeled  Sit to stand: Modified independence Stand to sit: Modified independence Chair to chair: Modified independence Floor: Deferred  Curb:  Deferred  Stairs: Level of Assistance: Modified independence Stair Negotiation Technique: Step to Pattern with Bilateral Rails Number of Stairs: 4  Height of Stairs: 6"  Comments: Pt moves slowly and leans backwards during descent  Gait: Gait pattern: step through pattern, decreased step length- Right, decreased step length- Left, and shuffling Distance walked: 100' Assistive device utilized: Environmental consultant - 2 wheeled Level of assistance: Modified independence Comments: ataxic gait with shuffling steps;  mCTSIB: unable to complete, pt only able to stand for approximately 2-3s in condition 1;  Orthostatic vitals (11/26/23): Supine: BP: 151/66 mmHg, HR: 66 bpm, SpO2: 97% Seated: BP: 151/58 mmHg, HR: 68 bpm, SpO2: 97% Standing (1 minute): BP: 144/60 mmHg, HR: 71 bpm, SpO2: 98% Standing (3 minutes): BP: 149/63 mmHg, HR: 69 bpm, SpO2: 99%   BPPV TESTS (11/26/23):  Symptoms Duration Intensity Nystagmus  L Dix-Hallpike Dizziness/lightheadedness   None  R Dix-Hallpike Dizziness/lightheadedness   None  L Head Roll None   None  R Head Roll None   None  L Sidelying Test      R Sidelying Test      (blank = not tested)  Functional Outcome Measures  Results 11/26/23 Comments  BERG 19/56  High fall risk  DGI  8/24   FGA     TUG 19.3 seconds  Increased fall risk  5TSTS 19.7  seconds  Increased fall risk  6 Minute Walk Test     10 Meter Gait Speed Self-selected: 15.5s = 0.65 m/s; Fastest: 12.5s = 0.8 m/s  Below functional community ambulation speeds  (Blank rows = not tested)   TODAY'S TREATMENT   SUBJECTIVE: Pt reports that she is doing alright today. No falls since the last therapy session and no health changes. No specific pain reported upon arrival today and no specific concerns.   PAIN: Denies   Neuromuscular Re-education  Alternating 6" step taps with faded UE support 2 x 10 BLE; Static balance with front foot on 6" step; Airex feet apart static balance eyes open/closed Airex feet apart horizontal and vertical head turns; Resisted gait with double black tband and faded UE support forward and backward x 4 each direction;   Ther-Activity: Pt education about postural awareness in space with Manual and Verbal cues throuh out the session. Spouse educated regardign cueing pt with the same. Good understanding demonstrated.  There ex:  NuStep L0-4 (seat 7, arms 9) x 10 minutes at start of session for warm-up, BLE strengthening, and interval history; Lower trunk rotation stretch 10 reps Hams stretch 1 x 30 secs to B DKC x 60 secs hold Bridges 2 x 10 reps Seated marches with GTB 2 x 10 reps Sit to stand with GTB and Ball squeeze hands on knees 2 x 10; Standing heel raises x 10 without UE support; Seated LAQ without UE support x 10 BLE; 3 way hip Flex/Add/Ext Knee felx with GTB 2 x 10 reps  STS with Ball squeeze with min assist of PT 2 x 10 reps. Seated clams with manual resistance from therapist x 10 BLE Seated adductor squeezes with manual resistance from therapist x 10 BLE; Gait training with FWW.  Not today: Alternating 6" step-ups with BUE support alternating leading LE x 10 BLE; Feet apart heel/toe raises without UE support x 15; Hooklying SLR hip flexion 2 x 10 BLE; Sidelying straight leg hip abduction 2 x 10 BLE; Hooklying bridges  with arms across chest 2 x 10; Supine adductor ball squeeze 2 x 10; Staggered balance on 6" step eyes  open/closed x 30s each BLE; Alternating 6" step taps with faded UE support x 10 BLE; Feet apart static balance gradually progressing feet closer together eyes open/closed x 60s each; Feet apart static balance gradually progressing feet closer together horizontal and vertical head turns x 60s each; Staggered stance balance eyes open/closed x 30s each BLE; Staggered stance balance horizontal/vertical head turns x 30s each BLE; Blaze pods toe touch, 3 pods in front on ground, with random pattern 3 x 60s;  Blaze pods toe touch, 3 pods in front on ground while seated, random pattern 2 x 60s (completed during seated rest breaks);   PATIENT EDUCATION:  Education details: Pt educated throughout session about proper posture and technique with exercises. Improved exercise technique, movement at target joints, use of target muscles after min to mod verbal, visual, tactile cues. Balance exercises Person educated: Patient Education method: Explanation and Verbal cues Education comprehension: verbalized understanding, returned demonstration, and verbal cues required   HOME EXERCISE PROGRAM:  Access Code: [email or web address] URL: [email or web address] Date: 11/28/2023 Prepared by: Ria Comment  Exercises - Standing Balance in Corner  - 1 x daily - 7 x weekly - 3 reps - 60s hold - Corner Balance Feet Together: Eyes Open With Head Turns  - 1 x daily - 7 x weekly - 3 reps - 60s hold - Supine Active Straight Leg Raise  - 1 x daily - 7 x weekly - 2 sets - 10 reps - 3s hold - Supine Bridge  - 1 x daily - 7 x weekly - 2 sets - 10 reps - 3s hold - Sidelying Hip Abduction  - 1 x daily - 7 x weekly - 2 sets - 10 reps - 3s hold   ASSESSMENT:  CLINICAL IMPRESSION: PT spent extra time educating pt with tactile and verbal cues through out the session regarding pt's awareness of posture in space. PT  provided gait training with VC to improve FWW use and fluid gait with improve knee flexion. Performed additional balance exercises during session today. She continues to demonstrate difficulty initiating movement and significant difficulty with dual tasking. Progressed strengthening as well. Plan to progress balance and strength exercises at future sessions while incorporating cognitive tasks. Pt encouraged to follow-up as scheduled. Pt will benefit from PT services to address deficits in strength, balance, and mobility in order to return to full function at home and decrease her risk for falls.    OBJECTIVE IMPAIRMENTS: decreased balance and decreased strength.   ACTIVITY LIMITATIONS: standing, squatting, stairs, and transfers  PARTICIPATION LIMITATIONS: meal prep, cleaning, laundry, shopping, and community activity  PERSONAL FACTORS: Age, Past/current experiences, Time since onset of injury/illness/exacerbation, and 3+ comorbidities: memory change, fatigue, and NPH  are also affecting patient's functional outcome.   REHAB POTENTIAL: Fair    CLINICAL DECISION MAKING: Unstable/unpredictable  EVALUATION COMPLEXITY: High   GOALS: Goals reviewed with patient? Yes  SHORT TERM GOALS: Target date: 12/17/2023   Pt will be independent with HEP in order to improve strength and balance in order to decrease fall risk and improve function at home. Baseline:  Goal status: INITIAL   LONG TERM GOALS: Target date: 01/14/2024  1.  Pt will improve BERG by at least 3 points in order to demonstrate clinically significant improvement in balance.   Baseline: 19/56 Goal status: INITIAL  2.  Pt will improve ABC by at least 13% in order to demonstrate clinically significant improvement in balance confidence.      Baseline: 40% Goal status: INITIAL  3.  Pt will decrease 5TSTS by at least 3 seconds in order to demonstrate clinically significant improvement in LE strength      Baseline: 19.7s Goal status:  INITIAL  4. Pt will decrease TUG to below 14 seconds/decrease in order to demonstrate decreased fall risk.  Baseline: 19.3s Goal status: INITIAL   PLAN: PT FREQUENCY: 2x/week  PT DURATION: 8 weeks  PLANNED INTERVENTIONS: Therapeutic exercises, Therapeutic activity, Neuromuscular re-education, Balance training, Gait training, Patient/Family education, Self Care, Joint mobilization, Joint manipulation, Vestibular training, Canalith repositioning, Orthotic/Fit training, DME instructions, Dry Needling, Electrical stimulation, Spinal manipulation, Spinal mobilization, Cryotherapy, Moist heat, Taping, Traction, Ultrasound, Ionotophoresis 4mg /ml Dexamethasone, Manual therapy, and Re-evaluation.  PLAN FOR NEXT SESSION: added seated ankle weight exercises, oculomotor/vestibular testing, progress balance and strength exercises, review/modify HEP as needed;   Sharalyn Ink Huprich PT, DPT, GCS  Crystal Lynch H Crystal Lynch 01/08/2024, 1:06 PM

## 2024-01-10 ENCOUNTER — Encounter: Payer: Self-pay | Admitting: Internal Medicine

## 2024-01-10 ENCOUNTER — Ambulatory Visit (INDEPENDENT_AMBULATORY_CARE_PROVIDER_SITE_OTHER): Payer: Medicare Other | Admitting: Internal Medicine

## 2024-01-10 ENCOUNTER — Ambulatory Visit: Payer: Medicare Other

## 2024-01-10 VITALS — BP 122/68 | HR 66 | Temp 97.3°F | Ht 62.0 in | Wt 134.8 lb

## 2024-01-10 DIAGNOSIS — M6281 Muscle weakness (generalized): Secondary | ICD-10-CM

## 2024-01-10 DIAGNOSIS — N289 Disorder of kidney and ureter, unspecified: Secondary | ICD-10-CM

## 2024-01-10 DIAGNOSIS — Z0000 Encounter for general adult medical examination without abnormal findings: Secondary | ICD-10-CM | POA: Diagnosis not present

## 2024-01-10 DIAGNOSIS — I10 Essential (primary) hypertension: Secondary | ICD-10-CM

## 2024-01-10 DIAGNOSIS — R2681 Unsteadiness on feet: Secondary | ICD-10-CM | POA: Diagnosis not present

## 2024-01-10 DIAGNOSIS — E7800 Pure hypercholesterolemia, unspecified: Secondary | ICD-10-CM

## 2024-01-10 DIAGNOSIS — R262 Difficulty in walking, not elsewhere classified: Secondary | ICD-10-CM

## 2024-01-10 DIAGNOSIS — F439 Reaction to severe stress, unspecified: Secondary | ICD-10-CM

## 2024-01-10 DIAGNOSIS — E039 Hypothyroidism, unspecified: Secondary | ICD-10-CM | POA: Diagnosis not present

## 2024-01-10 DIAGNOSIS — R413 Other amnesia: Secondary | ICD-10-CM

## 2024-01-10 DIAGNOSIS — I700 Atherosclerosis of aorta: Secondary | ICD-10-CM

## 2024-01-10 DIAGNOSIS — G912 (Idiopathic) normal pressure hydrocephalus: Secondary | ICD-10-CM

## 2024-01-10 MED ORDER — SERTRALINE HCL 25 MG PO TABS: 25.0000 mg | ORAL_TABLET | Freq: Every day | ORAL | 1 refills | Status: DC

## 2024-01-10 MED ORDER — ROSUVASTATIN CALCIUM 10 MG PO TABS: 10.0000 mg | ORAL_TABLET | Freq: Every day | ORAL | 3 refills | Status: DC

## 2024-01-10 MED ORDER — CARVEDILOL 12.5 MG PO TABS: 12.5000 mg | ORAL_TABLET | Freq: Two times a day (BID) | ORAL | 3 refills | Status: DC

## 2024-01-10 MED ORDER — LEVOTHYROXINE SODIUM 75 MCG PO TABS
75.0000 ug | ORAL_TABLET | Freq: Every day | ORAL | 3 refills | Status: DC
Start: 2024-01-10 — End: 2024-06-01

## 2024-01-10 MED ORDER — ASPIRIN 81 MG PO TBEC: 81.0000 mg | DELAYED_RELEASE_TABLET | Freq: Every day | ORAL | Status: AC

## 2024-01-10 MED ORDER — PANTOPRAZOLE SODIUM 40 MG PO TBEC: DELAYED_RELEASE_TABLET | ORAL | 3 refills | Status: DC

## 2024-01-10 MED ORDER — AMLODIPINE BESYLATE 5 MG PO TABS: 5.0000 mg | ORAL_TABLET | Freq: Every day | ORAL | 3 refills | Status: DC

## 2024-01-10 NOTE — Therapy (Signed)
 OUTPATIENT PHYSICAL THERAPY BALANCE TREATMENT   Patient Name: Crystal Lynch MRN: [id number] DOB:1941/01/20, 83 y.o., female Today's Date: 01/10/2024  END OF SESSION:  PT End of Session - 01/10/24 1346     Visit Number 10    Number of Visits 17    Date for PT Re-Evaluation 01/14/24    Authorization Type eval: 11/19/23    PT Start Time 0930    PT Stop Time 1014    PT Time Calculation (min) 44 min    Equipment Utilized During Treatment Gait belt    Activity Tolerance Patient tolerated treatment well    Behavior During Therapy WFL for tasks assessed/performed              Past Medical History:  Diagnosis Date   Arthritis    Chicken pox    Endometriosis    Esophagitis    s/p esophageal dilatation   GERD (gastroesophageal reflux disease)    History of ovarian cyst    Hypercholesterolemia    Hypertension    Hypothyroidism    Past Surgical History:  Procedure Laterality Date   ABDOMINAL SURGERY     found to have an ovarian cyst and endometriosis   APPENDECTOMY     Patient Active Problem List   Diagnosis Date Noted   Low back pain 12/10/2023   Postnasal drip 12/10/2023   Bursitis of right hip 09/12/2023   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   Stress 07/27/2022   Unsteady gait 07/10/2022   Hypophosphatemia 06/11/2022   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   OSA (obstructive sleep apnea) 06/09/2022   Kidney lesion 12/14/2021   Left hip pain 12/13/2021   Memory change 11/27/2021   Fatigue 07/23/2021   Aortic atherosclerosis (HCC) 04/17/2021   Right acute serous otitis media    Hyponatremia 02/23/2021   Prolonged QT interval 02/23/2021   Dizziness 09/12/2020   Fall 03/08/2020   Fatty liver 12/28/2019   Chest pain 07/25/2019   Frequent urinary tract infections 12/24/2018   UTI (urinary tract infection) 10/11/2018   Hypercalcemia 01/09/2017   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   Sleeping difficulties 03/08/2015   Health care maintenance 01/10/2015    Osteopenia 08/10/2014   Hyperbilirubinemia 10/27/2012   Hematuria 10/27/2012   Hypertension 10/21/2012   Hypercholesterolemia 10/21/2012   Hypothyroidism 10/21/2012   Esophagitis 10/21/2012    PCP: Dale Ambler, MD  REFERRING PROVIDER: Dale [place], MD   REFERRING DIAG: R26.81 (ICD-10-CM) - Unsteady gait   RATIONALE FOR EVALUATION AND TREATMENT: Rehabilitation  THERAPY DIAG: Difficulty in walking, not elsewhere classified  Muscle weakness (generalized)  Unsteadiness on feet  ONSET DATE: Chronic x years  FOLLOW-UP APPT SCHEDULED WITH REFERRING PROVIDER: Yes   FROM INITIAL EVALUATION SUBJECTIVE:  SUBJECTIVE STATEMENT:  Unsteadiness  PERTINENT HISTORY:  Pt referred to PT for unsteadiness and dizziness. History today obtained from both patient and husband. "I'm dizzy all the time." She denies vertigo but reports persistent unsteadiness with occasional lightheadedness. Pt has a history of NPH. She underwent a shunt trial at [facility] but there was not enough change in her testing to merit a permanent shunt placement. She is currently being followed by [facility] neurology and has a follow-up appointment this Wednesday. 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 She had a alpha-synuclein skin biopsy, EMG/NCS, echo, and CTA head and neck. 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 Recommended baby aspirin and CTA 6 months. 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 No evidence of motor neuron disease.  Pt reports shuffling gait and freezing episodes but no tremor. She was started on carbidopa-levodopa but has not noticed any change in her symptoms since starting. She uses a rollator at home and front wheeled walker when out in the community. Pt wears eyeglasses all the  time and has an upcoming check-up with optometry. History of occasional sinus headaches but no history of migraines. Pt reports some L hip weakness.  Pain: Yes, L hip pain Numbness/Tingling: No Focal Weakness: Yes, L hip weakness related to pain; Recent changes in overall health/medication: Yes, carvidopa/levodopa Prior history of physical therapy for balance:  Yes Dominant hand: right Red flags: no personal history of cancer, abdominal pain, chills/fever, night sweats, nausea, vomiting, unrelenting pain  PRECAUTIONS: None  WEIGHT BEARING RESTRICTIONS: No  FALLS: Has patient fallen in last 6 months? Yes. Number of falls 7-8 , Directional pattern for falls: Yes Backwards  , often occurs when turning.   Living Environment Lives with: lives with their spouse Lives in: House/apartment Stairs: 2 to enter, railing on left during ascend Has following equipment at home: [facility] over commode, walk-in shower with seat and grab bars. Wheelchair for extended distances.   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   Hobbies: Enjoys crafting, reading.   Patient Goals: Build confidence in her balance   OBJECTIVE:   Patient Surveys  ABC: 40%  Cognition Patient is oriented to person, place, and time.  Recent memory is grossly intact however some memory deficit is evident. Remote memory is intact.  Attention span and concentration are intact.  Expressive speech is intact.  Patient's fund of knowledge is within normal limits for educational level.    Gross Musculoskeletal Assessment Tremor: None Bulk: Normal Tone: Normal  Posture: No gross abnormalities noted in standing or seated posture with the exception of some mild forward head.  AROM Deferred  LE MMT: MMT (out of 5) Right  Left   Hip flexion 5 4+  Hip extension    Hip abduction (seated) 4+ 4  Hip adduction 4+ 4  Hip internal rotation     Hip external rotation    Knee flexion (seated) 5 5  Knee extension 5 5  Ankle dorsiflexion 5 5  (* = pain; Blank rows = not tested)  Sensation Deferred  Reflexes Deferred  Cranial Nerves Deferred  Coordination/Cerebellar Finger to Nose: Mild dysmetria LUE Heel to Shin: Deferred Rapid alternating movements: Abnormal LUE Finger Opposition: WNL Pronator Drift: Negative  Bed mobility: Deferred  Transfers: Assistive device utilized: Environmental consultant - 2 wheeled  Sit to stand: Modified independence Stand to sit: Modified independence Chair to chair: Modified independence Floor: Deferred  Curb:  Deferred  Stairs: Level of Assistance: Modified independence Stair Negotiation Technique: Step to Pattern with Bilateral Rails Number of Stairs: 4  Height of Stairs: 6"  Comments: Pt moves slowly and leans backwards during descent  Gait: Gait pattern: step through pattern, decreased step length- Right, decreased step length- Left, and shuffling Distance walked: 100' Assistive device utilized: Environmental consultant - 2 wheeled Level of assistance: Modified independence Comments: ataxic gait with shuffling steps;  mCTSIB: unable to complete, pt only able to stand for approximately 2-3s in condition 1;  Orthostatic vitals (11/26/23): Supine: BP: 151/66 mmHg, HR: 66 bpm, SpO2: 97% Seated: BP: 151/58 mmHg, HR: 68 bpm, SpO2: 97% Standing (1 minute): BP: 144/60 mmHg, HR: 71 bpm, SpO2: 98% Standing (3 minutes): BP: 149/63 mmHg, HR: 69 bpm, SpO2: 99%   BPPV TESTS (11/26/23):  Symptoms Duration Intensity Nystagmus  L Dix-Hallpike Dizziness/lightheadedness   None  R Dix-Hallpike Dizziness/lightheadedness   None  L Head Roll None   None  R Head Roll None   None  L Sidelying Test      R Sidelying Test      (blank = not tested)  Functional Outcome Measures  Results 11/26/23 Comments  BERG 19/56  High fall risk  DGI  8/24   FGA     TUG 19.3 seconds  Increased fall risk  5TSTS 19.7 seconds  Increased fall  risk  6 Minute Walk Test     10 Meter Gait Speed Self-selected: 15.5s = 0.65 m/s; Fastest: 12.5s = 0.8 m/s  Below functional community ambulation speeds  (Blank rows = not tested)   TODAY'S TREATMENT   SUBJECTIVE: Pt reports that she is doing alright today. No falls since the last therapy session and no health changes. No specific pain reported upon arrival today and no specific concerns.   PAIN: Denies   Neuromuscular Re-education  Alternating 6" step taps with faded UE support 2 x 10 BLE; Static balance with front foot on 6" step; Airex feet apart static balance eyes open/closed Airex feet apart horizontal and vertical head turns; Resisted gait with double black tband and faded UE support forward and backward x 4 each direction;   Ther-Activity: Pt education about postural awareness in space with Manual and Verbal cues throuh out the session. Spouse educated regardign cueing pt with the same. Good understanding demonstrated.  There ex:  NuStep L0-4 (seat 7, arms 9) x at start of session for warm-up, BLE strengthening, and interval history; Lower trunk rotation stretch 10 reps Hams stretch 1 x 30 secs to B DKC x 60 secs hold Bridges 2 x 10 reps Seated marches with GTB 2 x 10 reps Sit to stand with GTB and Ball squeeze hands on knees 2 x 10; Hamstring curls 2 x 10 reps #3  Side stepping with #3 in // bars Back stepping with #3 in // bars  Sstanding with EC x 30 secs HEP reviewed and updated provided in Lynch/O and RTB.   Gait Training:  Pt ambulated with FWW ~ 300 ft with VC to stop and regroup, lead with LLE, keep the walker closer and big step to improve stride and freezing.   Not today  Standing heel raises x 10 without UE support; Seated LAQ without UE support x 10 BLE; 3 way hip Flex/Add/Ext Knee felx with GTB 2 x 10 reps  STS with Ball squeeze with min assist of PT 2 x 10 reps. Seated clams with manual resistance from therapist x 10 BLE Seated  adductor squeezes  with manual resistance from therapist x 10 BLE; Gait training with FWW. Alternating 6" step-ups with BUE support alternating leading LE x 10 BLE; Feet apart heel/toe raises without UE support x 15; Hooklying SLR hip flexion 2 x 10 BLE; Sidelying straight leg hip abduction 2 x 10 BLE; Hooklying bridges with arms across chest 2 x 10; Supine adductor ball squeeze 2 x 10; Staggered balance on 6" step eyes open/closed x 30s each BLE; Alternating 6" step taps with faded UE support x 10 BLE; Feet apart static balance gradually progressing feet closer together eyes open/closed x 60s each; Feet apart static balance gradually progressing feet closer together horizontal and vertical head turns x 60s each; Staggered stance balance eyes open/closed x 30s each BLE; Staggered stance balance horizontal/vertical head turns x 30s each BLE; Blaze pods toe touch, 3 pods in front on ground, with random pattern 3 x 60s;  Blaze pods toe touch, 3 pods in front on ground while seated, random pattern 2 x 60s (completed during seated rest breaks);   PATIENT EDUCATION:  Education details: Pt educated throughout session about proper posture and technique with exercises. Improved exercise technique, movement at target joints, use of target muscles after min to mod verbal, visual, tactile cues. Balance exercises Person educated: Patient Education method: Explanation and Verbal cues Education comprehension: verbalized understanding, returned demonstration, and verbal cues required   HOME EXERCISE PROGRAM:  Access Code: [email or web address] URL: [email or web address] Date: 01/10/2024 Prepared by: Janet Berlin  Exercises - Supine Lower Trunk Rotation  - 3 x daily - 7 x weekly - 3 sets - 10 reps - Seated Hamstring Stretch  - 3 x daily - 7 x weekly - 1 sets - 10 reps - 10secs hold - Supine Double Knee to Chest  - 3 x daily - 7 x weekly - 1 sets - 10 reps - 10sec hold - Supine Double Knee to Chest  -  1 x daily - 7 x weekly - 1 sets - 10 reps - 10secs hold - Standing 3-Way Leg Reach with Resistance at Ankles and Counter Support  - 1 x daily - 7 x weekly - 3 sets - 10 reps - Seated Knee Extension with Resistance  - 1 x daily - 7 x weekly - 3 sets - 10 reps - Standing Hamstring Curl with Resistance  - 1 x daily - 7 x weekly - 3 sets - 10 reps      ASSESSMENT:  CLINICAL IMPRESSION: PT re-assessed pt and found pt is progressing with HEP but not independent. PT spent time with gait training in the clinic using FWW with VC to imporve gait and reduce freezing.  Performed additional balance exercises during session today. She continues to demonstrate difficulty initiating movement and significant difficulty with dual tasking. Progressed strengthening as well. Plan to progress balance and strength exercises at future sessions while incorporating cognitive tasks. Pt encouraged to follow-up as scheduled. HEP updated and provided in Lynch/O.  Pt will benefit from PT services to address deficits in strength, balance, and mobility in order to return to full function at home and decrease her risk for falls.    OBJECTIVE IMPAIRMENTS: decreased balance and decreased strength.   ACTIVITY LIMITATIONS: standing, squatting, stairs, and transfers  PARTICIPATION LIMITATIONS: meal prep, cleaning, laundry, shopping, and community activity  PERSONAL FACTORS: Age, Past/current experiences, Time since onset of injury/illness/exacerbation, and 3+ comorbidities: memory change, fatigue, and NPH  are also affecting patient's functional outcome.   REHAB POTENTIAL: Fair  CLINICAL DECISION MAKING: Unstable/unpredictable  EVALUATION COMPLEXITY: High   GOALS: Goals reviewed with patient? Yes  SHORT TERM GOALS: Target date: 12/17/2023   Pt will be independent with HEP in order to improve strength and balance in order to decrease fall risk and improve function at home. Baseline: HEP provided  Goal status: IN  progress   LONG TERM GOALS: Target date: 01/14/2024  1.  Pt will improve BERG by at least 3 points in order to demonstrate clinically significant improvement in balance.   Baseline: 19/56 Goal status: INITIAL  2.  Pt will improve ABC by at least 13% in order to demonstrate clinically significant improvement in balance confidence.      Baseline: 40% Goal status: INITIAL  3. Pt will decrease 5TSTS by at least 3 seconds in order to demonstrate clinically significant improvement in LE strength      Baseline: 19.7s Goal status: INITIAL  4. Pt will decrease TUG to below 14 seconds/decrease in order to demonstrate decreased fall risk.  Baseline: 19.3s Goal status: INITIAL   PLAN: PT FREQUENCY: 2x/week  PT DURATION: 8 weeks  PLANNED INTERVENTIONS: Therapeutic exercises, Therapeutic activity, Neuromuscular re-education, Balance training, Gait training, Patient/Family education, Self Care, Joint mobilization, Joint manipulation, Vestibular training, Canalith repositioning, Orthotic/Fit training, DME instructions, Dry Needling, Electrical stimulation, Spinal manipulation, Spinal mobilization, Cryotherapy, Moist heat, Taping, Traction, Ultrasound, Ionotophoresis 4mg /ml Dexamethasone, Manual therapy, and Re-evaluation.  PLAN FOR NEXT SESSION: added seated ankle weight exercises, oculomotor/vestibular testing, progress balance and strength exercises, review/modify HEP as needed;    Crystal Lynch Niara Bunker 01/10/2024, 1:49 PM

## 2024-01-10 NOTE — Progress Notes (Addendum)
 Subjective:    Patient ID: Crystal Lynch, female    DOB: 1940/10/30, 83 y.o.   MRN: [id number]  Patient here for  Chief Complaint  Patient presents with   Annual Exam    HPI Here for a physical exam. Evaluated 12/17/23 - [facility] - for possible NPH. Recommended repeat MRI cervical and thoracic spine. Also recommended holter monitor. Has been going to PT 2x/week. Feels she is getting stronger - especially with being able to "get up and down". She also reports is scheduled for what appears to be neuropsych testing. She denies any chest pain. Breathing stable. Eating. No abdominal pain or bowel change.    Past Medical History:  Diagnosis Date   Arthritis    Chicken pox    Endometriosis    Esophagitis    s/p esophageal dilatation   GERD (gastroesophageal reflux disease)    History of ovarian cyst    Hypercholesterolemia    Hypertension    Hypothyroidism    Past Surgical History:  Procedure Laterality Date   ABDOMINAL SURGERY     found to have an ovarian cyst and endometriosis   APPENDECTOMY     Family History  Problem Relation Age of Onset   Lung disease Father    Heart attack Father    Heart disease Mother        myocardial infarction   Breast cancer Mother 76   Heart attack Mother    Hypertension Mother    Hyperlipidemia Mother    Breast cancer Sister 9   Hypertension Sister    Rheum arthritis Sister    Breast cancer Maternal Aunt 50   Hypertension Brother    Colon cancer Neg Hx    Social History   Socioeconomic History   Marital status: Married    Spouse name: Not on file   Number of children: Not on file   Years of education: Not on file   Highest education level: Associate degree: occupational, Scientist, product/process development, or vocational program  Occupational History   Not on file  Tobacco Use   Smoking status: Never   Smokeless tobacco: Never  Vaping Use   Vaping status: Never Used  Substance and Sexual Activity   Alcohol use: No    Alcohol/week: 0.0 standard drinks  of alcohol   Drug use: No   Sexual activity: Never  Other Topics Concern   Not on file  Social History Narrative   Married   Social Drivers of Health   Financial Resource Strain: Low Risk  (01/06/2024)   Overall Financial Resource Strain (CARDIA)    Difficulty of Paying Living Expenses: Not hard at all  Food Insecurity: No Food Insecurity (01/06/2024)   Hunger Vital Sign    Worried About Running Out of Food in the Last Year: Never true    Ran Out of Food in the Last Year: Never true  Transportation Needs: No Transportation Needs (01/06/2024)   PRAPARE - Administrator, Civil Service (Medical): No    Lack of Transportation (Non-Medical): No  Physical Activity: Insufficiently Active (01/06/2024)   Exercise Vital Sign    Days of Exercise per Week: 4 days    Minutes of Exercise per Session: 10 min  Stress: No Stress Concern Present (01/06/2024)   [employer] of Occupational Health - Occupational Stress Questionnaire    Feeling of Stress : Not at all  Social Connections: Socially Integrated (01/06/2024)   Social Connection and Isolation Panel [NHANES]    Frequency of Communication with  Friends and Family: More than three times a week    Frequency of Social Gatherings with Friends and Family: More than three times a week    Attends Religious Services: More than 4 times per year    Active Member of [employer] or Organizations: Yes    Attends Engineer, structural: More than 4 times per year    Marital Status: Married     Review of Systems  Constitutional:  Negative for appetite change and unexpected weight change.  HENT:  Negative for congestion, sinus pressure and sore throat.   Eyes:  Negative for pain and visual disturbance.  Respiratory:  Negative for cough, chest tightness and shortness of breath.   Cardiovascular:  Negative for chest pain, palpitations and leg swelling.  Gastrointestinal:  Negative for abdominal pain, diarrhea, nausea and vomiting.   Genitourinary:  Negative for difficulty urinating and dysuria.  Musculoskeletal:  Negative for joint swelling and myalgias.  Skin:  Negative for color change and rash.  Neurological:  Negative for syncope and headaches.       Unsteadiness.   Hematological:  Negative for adenopathy. Does not bruise/bleed easily.  Psychiatric/Behavioral:  Negative for agitation and dysphoric mood.        Objective:     BP 122/68   Pulse 66   Temp (!) 97.3 F (36.3 C) (Oral)   Ht 5\' 2"  (1.575 m)   Wt 134 lb 12.8 oz (61.1 kg)   LMP 10/21/1984   SpO2 98%   BMI 24.66 kg/m  Wt Readings from Last 3 Encounters:  01/10/24 134 lb 12.8 oz (61.1 kg)  12/10/23 136 lb (61.7 kg)  10/04/23 136 lb 6.4 oz (61.9 kg)    Physical Exam Vitals reviewed.  Constitutional:      General: She is not in acute distress.    Appearance: Normal appearance. She is well-developed.  HENT:     Head: Normocephalic and atraumatic.     Right Ear: External ear normal.     Left Ear: External ear normal.     Mouth/Throat:     Pharynx: No oropharyngeal exudate or posterior oropharyngeal erythema.  Eyes:     General: No scleral icterus.       Right eye: No discharge.        Left eye: No discharge.     Conjunctiva/sclera: Conjunctivae normal.  Neck:     Thyroid: No thyromegaly.  Cardiovascular:     Rate and Rhythm: Normal rate and regular rhythm.  Pulmonary:     Effort: No tachypnea, accessory muscle usage or respiratory distress.     Breath sounds: Normal breath sounds. No decreased breath sounds or wheezing.  Chest:  Breasts:    Right: No inverted nipple, mass, nipple discharge or tenderness (no axillary adenopathy).     Left: No inverted nipple, mass, nipple discharge or tenderness (no axilarry adenopathy).  Abdominal:     General: Bowel sounds are normal.     Palpations: Abdomen is soft.     Tenderness: There is no abdominal tenderness.  Musculoskeletal:        General: No swelling or tenderness.     Cervical  back: Neck supple.  Lymphadenopathy:     Cervical: No cervical adenopathy.  Skin:    Findings: No erythema or rash.  Neurological:     Mental Status: She is alert and oriented to person, place, and time.  Psychiatric:        Mood and Affect: Mood normal.  Behavior: Behavior normal.         Outpatient Encounter Medications as of 01/10/2024  Medication Sig   acetaminophen (TYLENOL) 500 MG tablet Take 1 tablet (500 mg total) by mouth every 6 (six) hours as needed for mild pain (or Fever >/= 101).   azelastine (ASTELIN) 0.1 % nasal spray Place 2 sprays into both nostrils 2 (two) times daily. Use in each nostril as directed   Biotin 5 MG CAPS Take 5,000 mcg by mouth daily.   dorzolamide-timolol (COSOPT) 22.3-6.8 MG/ML ophthalmic solution Place 1 drop into both eyes 2 (two) times daily.   HYDROcodone-acetaminophen (NORCO/VICODIN) 5-325 MG tablet Take 1 tablet by mouth every 6 (six) hours as needed for moderate pain (pain score 4-6).   latanoprost (XALATAN) 0.005 % ophthalmic solution 1 drop at bedtime.   lisinopril (ZESTRIL) 40 MG tablet Take 1 tablet (40 mg total) by mouth daily.   rivastigmine (EXELON) 1.5 MG capsule Take 1 capsule (1.5 mg total) by mouth 2 (two) times daily.   timolol (TIMOPTIC) 0.5 % ophthalmic solution Place 1 drop into both eyes 2 (two) times daily.   amLODipine (NORVASC) 5 MG tablet Take 1 tablet (5 mg total) by mouth daily.   aspirin EC 81 MG tablet Take 1 tablet (81 mg total) by mouth daily. Swallow whole.   carvedilol (COREG) 12.5 MG tablet Take 1 tablet (12.5 mg total) by mouth 2 (two) times daily with a meal.   levothyroxine (SYNTHROID) 75 MCG tablet Take 1 tablet (75 mcg total) by mouth daily.   pantoprazole (PROTONIX) 40 MG tablet TAKE 1 TABLET BY MOUTH EVERY DAY   rosuvastatin (CRESTOR) 10 MG tablet Take 1 tablet (10 mg total) by mouth daily.   sertraline (ZOLOFT) 25 MG tablet Take 1 tablet (25 mg total) by mouth daily.   [DISCONTINUED] amLODipine  (NORVASC) 5 MG tablet Take 1 tablet (5 mg total) by mouth daily.   [DISCONTINUED] aspirin 81 MG EC tablet Take 1 tablet (81 mg total) by mouth daily. Swallow whole.   [DISCONTINUED] carvedilol (COREG) 12.5 MG tablet Take 1 tablet (12.5 mg total) by mouth 2 (two) times daily with a meal.   [DISCONTINUED] levothyroxine (SYNTHROID) 75 MCG tablet Take 1 tablet (75 mcg total) by mouth daily.   [DISCONTINUED] Multiple Vitamins-Minerals (ZINC PO) Take 1 tablet by mouth daily.   [DISCONTINUED] pantoprazole (PROTONIX) 40 MG tablet TAKE 1 TABLET BY MOUTH EVERY DAY   [DISCONTINUED] rosuvastatin (CRESTOR) 10 MG tablet Take 1 tablet (10 mg total) by mouth daily.   [DISCONTINUED] sertraline (ZOLOFT) 25 MG tablet Take 1 tablet (25 mg total) by mouth daily.   [DISCONTINUED] Zinc 50 MG TABS Take by mouth.   No facility-administered encounter medications on file as of 01/10/2024.     Lab Results  Component Value Date   WBC 8.4 05/26/2023   HGB 14.2 05/26/2023   HCT 40.9 05/26/2023   PLT 215 05/26/2023   GLUCOSE 106 (H) 01/10/2024   CHOL 143 01/10/2024   TRIG 201.0 (H) 01/10/2024   HDL 47.50 01/10/2024   LDLDIRECT 88.0 03/22/2021   LDLCALC 55 01/10/2024   ALT 6 01/10/2024   AST 14 01/10/2024   NA 138 01/10/2024   K 3.7 01/10/2024   CL 102 01/10/2024   CREATININE 0.78 01/10/2024   BUN 17 01/10/2024   CO2 26 01/10/2024   TSH 1.26 04/27/2023   INR 1.0 08/12/2015    DG Ribs Unilateral W/Chest Right Result Date: 12/11/2023 CLINICAL DATA:  Right rib fracture seen on  lumbar spine radiograph. EXAM: RIGHT RIBS AND CHEST - 3+ VIEW COMPARISON:  Lumbar spine radiograph dated 12/10/2023. FINDINGS: There is background of emphysema and interstitial coarsening. Minimal bibasilar atelectasis. No focal consolidation, pleural effusion, pneumothorax. The cardiac silhouette is within limits. Atherosclerotic calcification of the aorta. Osteopenia with degenerative changes of the spine. Minimally displaced fractures of  the lateral right ninth and tenth ribs. A 6 mm radiopaque focus over the right renal silhouette likely a stone. IMPRESSION: 1. Minimally displaced fractures of the lateral right ninth and tenth ribs. No pneumothorax. 2. Emphysema. 3. Probable right renal stone. Electronically Signed   By: Elgie Collard M.D.   On: 12/11/2023 16:40   DG Lumbar Spine Complete Result Date: 12/10/2023 CLINICAL DATA:  Midline low back pain and tenderness after a fall. Sciatica. EXAM: LUMBAR SPINE - COMPLETE 4+ VIEW COMPARISON:  Lumbar MRI 05/01/2018 FINDINGS: Substantial levoconvex lumbar scoliosis with rotary component. Bony demineralization which reduces diagnostic sensitivity and specificity. Acute right lateral ninth and tenth rib fractures. Loss of intervertebral disc height at all levels in the lumbar spine (chronic). Atherosclerosis is present, including aortoiliac atherosclerotic disease. Calcified uterine fibroid. 1.1 cm anterolisthesis of L5 on S1, formerly 0.7 cm on 05/01/2018. This appears to primarily be degenerative, pars defects at the L5 level are not definitively demonstrated. Mild remote inferior endplate compression anteriorly at T11. Degenerative facet arthropathy bilaterally at L4-5 and L5-S1. IMPRESSION: 1. Acute right lateral ninth and tenth rib fractures. 2. Substantial levoconvex lumbar scoliosis with rotary component. 3. Bony demineralization and degenerative findings. 4. 1.1 cm anterolisthesis of L5 on S1, formerly 0.7 cm on 05/01/2018. This appears to primarily be degenerative, pars defects at the L5 level are not definitively demonstrated. 5.  Aortic Atherosclerosis (ICD10-I70.0). 6. Calcified uterine fibroid. Electronically Signed   By: Gaylyn Rong M.D.   On: 12/10/2023 16:35       Assessment & Plan:  Routine general medical examination at a health care facility  Hypothyroidism, unspecified type Assessment & Plan: On thyroid replacement.  Follow tsh.   Orders: -     Levothyroxine  Sodium; Take 1 tablet (75 mcg total) by mouth daily.  Dispense: 90 tablet; Refill: 3  Hypercholesterolemia Assessment & Plan: Continue crestor. Follow lipid panel and liver function tests.  Lab Results  Component Value Date   CHOL 143 01/10/2024   HDL 47.50 01/10/2024   LDLCALC 55 01/10/2024   LDLDIRECT 88.0 03/22/2021   TRIG 201.0 (H) 01/10/2024   CHOLHDL 3 01/10/2024     Orders: -     Basic metabolic panel -     Hepatic function panel -     Lipid panel  Health care maintenance Assessment & Plan: Physical today 01/10/24. Mammogram 05/22/23- Birads 1.  PAP 03/22/16 - negative with negative HPV.  Colonoscopy 08/2012.     Unsteady gait Assessment & Plan:  Evaluated at [facility] (Dr. Idelle Crouch, Dr. Glenice Bow, Dr. Roda Shutters). Documentation reviewed. Their team recommended extended lumbar train trial and a DATscan as patient has some parkinsonian features (bradykinesia, rigidity, and abnormal EOMs noted on exam). Lumbar puncture was nondiagnostic as could only drain 8mL of CSF. SPECT scan was unremarkable as well.  Patient completed Lumbar drain trial at [facility] 05/29/2022, removed 300 cc of CSF fluid, dizziness improved, balance and memory/confusion did not improve. She completed in home PT. Given persistent issues, had appt with [facility] neurology for further evaluation.  Appt as outlined. Placed on sinemet. Also recommended EMG/NCS and CTA head and neck and echo. 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 Recommended baby aspirin and CTA 6 months. Had f/u CTA 06/2023 and f/u with Dr Haywood Pao 07/2023. Recommended baby aspirin and that she come back in 6 months for f/u CTA.  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 No evidence of motor neuron disease.awaiting further testing. Given persistent balance issues and unsteady gait, was referred for outpatient PT. She had been going 2x/week. Reports feeling stronger - noticed more with being able to get up and down better.  With  the possible bradykinesia and balance impairment: she saw neurology ([facility]). They are planning for alpha-synuclein skin biopsy. Recommended to increase dose of Sinemet 25/100 mg from1 tablet 3 times daily slowly to 2 tablets 3 times daily and recommended continuing PT.    Stress Assessment & Plan: Increased stress related to above. Continue zoloft. Has good support.    NPH (normal pressure hydrocephalus) (HCC) Assessment & Plan: Recent history of admission to [facility] for further evaluation of NPH and external lumbar drainage of csf via lumbar spinal catheter. Did not qualify for shunt placement.  Persistent dizziness and unsteadiness. Being followed by [facility] neurology as outlined.    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 Scheduled for Neuropsych evaluation is scheduled in April 2025.   Kidney lesion Assessment & Plan: Seeing nephrology (11/2021).  Recommended yearly renal ultrasound. F/u renal ultrasound 03/2023 - stable.  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 No other abnormalities.   Primary hypertension Assessment & Plan: Continue amlodipine, lisinopril (40mg ) and carvedilol 12.5mg  bid.  Continue to spot check her pressures.  Send in readings. Follow metabolic panel.    Aortic atherosclerosis (HCC) Assessment & Plan: Continue crestor.    Other orders -     amLODIPine Besylate; Take 1 tablet (5 mg total) by mouth daily.  Dispense: 90 tablet; Refill: 3 -     Aspirin; Take 1 tablet (81 mg total) by mouth daily. Swallow whole. -     Carvedilol; Take 1 tablet (12.5 mg total) by mouth 2 (two) times daily with a meal.  Dispense: 180 tablet; Refill: 3 -     Pantoprazole Sodium; TAKE 1 TABLET BY MOUTH EVERY DAY  Dispense: 90 tablet; Refill: 3 -     Rosuvastatin  Calcium; Take 1 tablet (10 mg total) by mouth daily.  Dispense: 90 tablet; Refill: 3 -     Sertraline HCl; Take 1 tablet (25 mg total) by mouth daily.  Dispense: 90 tablet; Refill: 1     Dale Heyburn, MD

## 2024-01-10 NOTE — Assessment & Plan Note (Signed)
 Physical today 01/10/24. Mammogram 05/22/23- Birads 1.  PAP 03/22/16 - negative with negative HPV.  Colonoscopy 08/2012.

## 2024-01-11 LAB — BASIC METABOLIC PANEL
BUN: 17 mg/dL (ref 6–23)
CO2: 26 meq/L (ref 19–32)
Calcium: 10.5 mg/dL (ref 8.4–10.5)
Chloride: 102 meq/L (ref 96–112)
Creatinine, Ser: 0.78 mg/dL (ref 0.40–1.20)
GFR: 69.91 mL/min (ref >60.00)
Glucose, Bld: 106 mg/dL — ABNORMAL HIGH (ref 70–99)
Potassium: 3.7 meq/L (ref 3.5–5.1)
Sodium: 138 meq/L (ref 135–145)

## 2024-01-11 LAB — HEPATIC FUNCTION PANEL
ALT: 6 U/L (ref 0–35)
AST: 14 U/L (ref 0–37)
Albumin: 4.5 g/dL (ref 3.5–5.2)
Alkaline Phosphatase: 94 U/L (ref 39–117)
Bilirubin, Direct: 0.2 mg/dL (ref 0.0–0.3)
Total Bilirubin: 0.9 mg/dL (ref 0.2–1.2)
Total Protein: 7 g/dL (ref 6.0–8.3)

## 2024-01-11 LAB — LIPID PANEL
Cholesterol: 143 mg/dL (ref 0–200)
HDL: 47.5 mg/dL (ref >39.00)
LDL Cholesterol: 55 mg/dL (ref 0–99)
NonHDL: 95.46
Total CHOL/HDL Ratio: 3
Triglycerides: 201 mg/dL — ABNORMAL HIGH (ref 0.0–149.0)
VLDL: 40.2 mg/dL — ABNORMAL HIGH (ref 0.0–40.0)

## 2024-01-12 ENCOUNTER — Encounter: Payer: Self-pay | Admitting: Internal Medicine

## 2024-01-12 NOTE — Assessment & Plan Note (Signed)
 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 Scheduled for Neuropsych evaluation is scheduled in April 2025.

## 2024-01-12 NOTE — Assessment & Plan Note (Signed)
 Recent history of admission to [facility] for further evaluation of NPH and external lumbar drainage of csf via lumbar spinal catheter. Did not qualify for shunt placement.  Persistent dizziness and unsteadiness. Being followed by [facility] neurology as outlined.

## 2024-01-12 NOTE — Assessment & Plan Note (Signed)
 On thyroid replacement.  Follow tsh.

## 2024-01-12 NOTE — Assessment & Plan Note (Signed)
 Continue crestor

## 2024-01-12 NOTE — Assessment & Plan Note (Signed)
 Continue amlodipine, lisinopril (40mg ) and carvedilol 12.5mg  bid.  Continue to spot check her pressures.  Send in readings. Follow metabolic panel.

## 2024-01-12 NOTE — Assessment & Plan Note (Signed)
 Increased stress related to above. Continue zoloft. Has good support.

## 2024-01-12 NOTE — Assessment & Plan Note (Signed)
 Evaluated at [facility] (Dr. Idelle Crouch, Dr. Glenice Bow, Dr. Roda Shutters). Documentation reviewed. Their team recommended extended lumbar train trial and a DATscan as patient has some parkinsonian features (bradykinesia, rigidity, and abnormal EOMs noted on exam). Lumbar puncture was nondiagnostic as could only drain 8mL of CSF. SPECT scan was unremarkable as well.  Patient completed Lumbar drain trial at [facility] 05/29/2022, removed 300 cc of CSF fluid, dizziness improved, balance and memory/confusion did not improve. She completed in home PT. Given persistent issues, had appt with [facility] neurology for further evaluation.  Appt as outlined. Placed on sinemet. Also recommended EMG/NCS and CTA head and neck and echo. 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 Recommended baby aspirin and CTA 6 months. Had f/u CTA 06/2023 and f/u with Dr Haywood Pao 07/2023. Recommended baby aspirin and that she come back in 6 months for f/u CTA.  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 No evidence of motor neuron disease.awaiting further testing. Given persistent balance issues and unsteady gait, was referred for outpatient PT. She had been going 2x/week. Reports feeling stronger - noticed more with being able to get up and down better.  With the possible bradykinesia and balance impairment: she saw neurology ([facility]). They are planning for alpha-synuclein skin biopsy. Recommended to increase dose of Sinemet 25/100 mg from1 tablet 3 times daily slowly to 2 tablets 3 times daily and recommended continuing PT.

## 2024-01-12 NOTE — Addendum Note (Signed)
 Addended by: Charm Barges on: 01/12/2024 08:18 AM   Modules accepted: Level of Service

## 2024-01-12 NOTE — Assessment & Plan Note (Signed)
Seeing nephrology (11/2021).  Recommended yearly renal ultrasound. F/u renal ultrasound 03/2023 - stable.  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 No other abnormalities.

## 2024-01-12 NOTE — Assessment & Plan Note (Signed)
 Continue crestor. Follow lipid panel and liver function tests.  Lab Results  Component Value Date   CHOL 143 01/10/2024   HDL 47.50 01/10/2024   LDLCALC 55 01/10/2024   LDLDIRECT 88.0 03/22/2021   TRIG 201.0 (H) 01/10/2024   CHOLHDL 3 01/10/2024

## 2024-01-14 NOTE — Therapy (Signed)
 OUTPATIENT PHYSICAL THERAPY BALANCE TREATMENT/RECERTIFICATION   Patient Name: Crystal Lynch MRN: [id number] DOB:Aug 19, 1941, 83 y.o., female Today's Date: 01/15/2024  END OF SESSION:  PT End of Session - 01/15/24 0848     Visit Number 11    Number of Visits 33    Date for PT Re-Evaluation 03/11/24    Authorization Type eval: 11/19/23    PT Start Time 0850    PT Stop Time 0930    PT Time Calculation (min) 40 min    Equipment Utilized During Treatment Gait belt    Activity Tolerance Patient tolerated treatment well    Behavior During Therapy WFL for tasks assessed/performed            Past Medical History:  Diagnosis Date   Arthritis    Chicken pox    Endometriosis    Esophagitis    s/p esophageal dilatation   GERD (gastroesophageal reflux disease)    History of ovarian cyst    Hypercholesterolemia    Hypertension    Hypothyroidism    Past Surgical History:  Procedure Laterality Date   ABDOMINAL SURGERY     found to have an ovarian cyst and endometriosis   APPENDECTOMY     Patient Active Problem List   Diagnosis Date Noted   Low back pain 12/10/2023   Postnasal drip 12/10/2023   Bursitis of right hip 09/12/2023   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   Stress 07/27/2022   Unsteady gait 07/10/2022   Hypophosphatemia 06/11/2022   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   OSA (obstructive sleep apnea) 06/09/2022   Kidney lesion 12/14/2021   Left hip pain 12/13/2021   Memory change 11/27/2021   Fatigue 07/23/2021   Aortic atherosclerosis (HCC) 04/17/2021   Right acute serous otitis media    Hyponatremia 02/23/2021   Prolonged QT interval 02/23/2021   Dizziness 09/12/2020   Fall 03/08/2020   Fatty liver 12/28/2019   Chest pain 07/25/2019   Frequent urinary tract infections 12/24/2018   UTI (urinary tract infection) 10/11/2018   Hypercalcemia 01/09/2017   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   Sleeping difficulties 03/08/2015   Health care  maintenance 01/10/2015   Osteopenia 08/10/2014   Hyperbilirubinemia 10/27/2012   Hematuria 10/27/2012   Hypertension 10/21/2012   Hypercholesterolemia 10/21/2012   Hypothyroidism 10/21/2012   Esophagitis 10/21/2012    PCP: Dale Palatine, MD  REFERRING PROVIDER: Dale Franklin, MD   REFERRING DIAG: R26.81 (ICD-10-CM) - Unsteady gait   RATIONALE FOR EVALUATION AND TREATMENT: Rehabilitation  THERAPY DIAG: Difficulty in walking, not elsewhere classified  Muscle weakness (generalized)  ONSET DATE: Chronic x years  FOLLOW-UP APPT SCHEDULED WITH REFERRING PROVIDER: Yes   FROM INITIAL EVALUATION SUBJECTIVE:  SUBJECTIVE STATEMENT:  Unsteadiness  PERTINENT HISTORY:  Pt referred to PT for unsteadiness and dizziness. History today obtained from both patient and husband. "I'm dizzy all the time." She denies vertigo but reports persistent unsteadiness with occasional lightheadedness. Pt has a history of NPH. She underwent a shunt trial at [facility] but there was not enough change in her testing to merit a permanent shunt placement. She is currently being followed by [facility] neurology and has a follow-up appointment this Wednesday. 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 She had a alpha-synuclein skin biopsy, EMG/NCS, echo, and CTA head and neck. 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 Recommended baby aspirin and CTA 6 months. 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 No evidence of motor neuron disease.  Pt reports shuffling gait and freezing episodes but no tremor. She was started on carbidopa-levodopa but has not noticed any change in her symptoms since starting. She uses a rollator at home and front wheeled walker when out in the community. Pt wears eyeglasses all the  time and has an upcoming check-up with optometry. History of occasional sinus headaches but no history of migraines. Pt reports some L hip weakness.  Pain: Yes, L hip pain Numbness/Tingling: No Focal Weakness: Yes, L hip weakness related to pain; Recent changes in overall health/medication: Yes, carvidopa/levodopa Prior history of physical therapy for balance:  Yes Dominant hand: right Red flags: no personal history of cancer, abdominal pain, chills/fever, night sweats, nausea, vomiting, unrelenting pain  PRECAUTIONS: None  WEIGHT BEARING RESTRICTIONS: No  FALLS: Has patient fallen in last 6 months? Yes. Number of falls 7-8 , Directional pattern for falls: Yes Backwards  , often occurs when turning.   Living Environment Lives with: lives with their spouse Lives in: House/apartment Stairs: 2 to enter, railing on left during ascend Has following equipment at home: [facility] over commode, walk-in shower with seat and grab bars. Wheelchair for extended distances.   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   Hobbies: Enjoys crafting, reading.   Patient Goals: Build confidence in her balance   OBJECTIVE:   Patient Surveys  ABC: 40%  Cognition Patient is oriented to person, place, and time.  Recent memory is grossly intact however some memory deficit is evident. Remote memory is intact.  Attention span and concentration are intact.  Expressive speech is intact.  Patient's fund of knowledge is within normal limits for educational level.    Gross Musculoskeletal Assessment Tremor: None Bulk: Normal Tone: Normal  Posture: No gross abnormalities noted in standing or seated posture with the exception of some mild forward head.  AROM Deferred  LE MMT: MMT (out of 5) Right  Left   Hip flexion 5 4+  Hip extension    Hip abduction (seated) 4+ 4  Hip adduction 4+ 4  Hip internal rotation     Hip external rotation    Knee flexion (seated) 5 5  Knee extension 5 5  Ankle dorsiflexion 5 5  (* = pain; Blank rows = not tested)  Sensation Deferred  Reflexes Deferred  Cranial Nerves Deferred  Coordination/Cerebellar Finger to Nose: Mild dysmetria LUE Heel to Shin: Deferred Rapid alternating movements: Abnormal LUE Finger Opposition: WNL Pronator Drift: Negative  Bed mobility: Deferred  Transfers: Assistive device utilized: Environmental consultant - 2 wheeled  Sit to stand: Modified independence Stand to sit: Modified independence Chair to chair: Modified independence Floor: Deferred  Curb:  Deferred  Stairs: Level of Assistance: Modified independence Stair Negotiation Technique: Step to Pattern with Bilateral Rails Number of Stairs: 4  Height of Stairs: 6"  Comments: Pt moves slowly and leans backwards during descent  Gait: Gait pattern: step through pattern, decreased step length- Right, decreased step length- Left, and shuffling Distance walked: 100' Assistive device utilized: Environmental consultant - 2 wheeled Level of assistance: Modified independence Comments: ataxic gait with shuffling steps;  mCTSIB: unable to complete, pt only able to stand for approximately 2-3s in condition 1;  Orthostatic vitals (11/26/23): Supine: BP: 151/66 mmHg, HR: 66 bpm, SpO2: 97% Seated: BP: 151/58 mmHg, HR: 68 bpm, SpO2: 97% Standing (1 minute): BP: 144/60 mmHg, HR: 71 bpm, SpO2: 98% Standing (3 minutes): BP: 149/63 mmHg, HR: 69 bpm, SpO2: 99%   BPPV TESTS (11/26/23):  Symptoms Duration Intensity Nystagmus  L Dix-Hallpike Dizziness/lightheadedness   None  R Dix-Hallpike Dizziness/lightheadedness   None  L Head Roll None   None  R Head Roll None   None  L Sidelying Test      R Sidelying Test      (blank = not tested)  Functional Outcome Measures  Results 11/26/23 Comments  BERG 19/56  High fall risk  DGI  8/24   FGA     TUG 19.3 seconds  Increased fall risk  5TSTS 19.7 seconds  Increased fall  risk  6 Minute Walk Test     10 Meter Gait Speed Self-selected: 15.5s = 0.65 m/s; Fastest: 12.5s = 0.8 m/s  Below functional community ambulation speeds  (Blank rows = not tested)   TODAY'S TREATMENT   SUBJECTIVE: Pt reports that she is doing alright today. No falls since the last therapy session and no health changes. No specific pain reported upon arrival today and no specific concerns.   PAIN: Denies   Neuromuscular Re-education  Updated outcome measures/goals (see goal section); Forward/backward gait in // bars without UE support x multiple lengths; Lateral stepping in // bars without UE support x multiple lengths; Forward stepping in agility ladder with verbal and visual cues to increase step length x multiple bouts;   Not today  Standing heel raises x 10 without UE support; Seated LAQ without UE support x 10 BLE; 3 way hip Flex/Add/Ext Knee felx with GTB 2 x 10 reps  STS with Ball squeeze with min assist of PT 2 x 10 reps. Seated clams with manual resistance from therapist x 10 BLE Seated adductor squeezes with manual resistance from therapist x 10 BLE; Gait training with FWW. Alternating 6" step-ups with BUE support alternating leading LE x 10 BLE; Feet apart heel/toe raises without UE support x 15; Staggered balance on 6" step eyes open/closed x 30s each BLE; Feet apart static balance gradually progressing feet closer together eyes open/closed x 60s each; Feet apart static balance gradually progressing feet closer together horizontal and vertical head turns x 60s each; Staggered stance balance eyes open/closed x 30s each BLE; Staggered stance balance horizontal/vertical head turns x 30s each BLE; Blaze pods toe touch, 3 pods in front on ground, with random pattern 3 x 60s;  Blaze pods toe touch, 3 pods in front on ground while seated, random pattern 2 x 60s (completed during seated rest breaks); Resisted gait with double black tband and faded UE support forward and  backward x 4 each direction;   PATIENT EDUCATION:  Education details: Pt educated throughout session about proper posture and technique with exercises. Improved exercise technique, movement at target joints, use of target  muscles after min to mod verbal, visual, tactile cues. Balance exercises Person educated: Patient Education method: Explanation and Verbal cues Education comprehension: verbalized understanding, returned demonstration, and verbal cues required   HOME EXERCISE PROGRAM:  Access Code: [email or web address] URL: [email or web address] Date: 01/10/2024 Prepared by: Janet Berlin  Exercises - Supine Lower Trunk Rotation  - 3 x daily - 7 x weekly - 3 sets - 10 reps - Seated Hamstring Stretch  - 3 x daily - 7 x weekly - 1 sets - 10 reps - 10secs hold - Supine Double Knee to Chest  - 3 x daily - 7 x weekly - 1 sets - 10 reps - 10sec hold - Supine Double Knee to Chest  - 1 x daily - 7 x weekly - 1 sets - 10 reps - 10secs hold - Standing 3-Way Leg Reach with Resistance at Ankles and Counter Support  - 1 x daily - 7 x weekly - 3 sets - 10 reps - Seated Knee Extension with Resistance  - 1 x daily - 7 x weekly - 3 sets - 10 reps - Standing Hamstring Curl with Resistance  - 1 x daily - 7 x weekly - 3 sets - 10 reps   ASSESSMENT:  CLINICAL IMPRESSION: Updated outcome measures with patient during visit today.  She demonstrates improvement in her balance confidence scoring 65% on the ABC compared to 40% initially.  Her leg strength has also improved with a notable improvement in her 5 times sit to stand test.  Unfortunately her TUG and BERG tests were unchanged since the initial evaluation.  Subjectively, patient reports slight improvement in balance and starting with therapy.  Due to aforementioned progress plan to continue therapy until no further benefit is achieved. Additional time during session spent on dynamic balance training. Plan to progress balance and strength exercises at  future sessions while incorporating cognitive tasks. Pt encouraged to follow-up as scheduled. No HEP updates on this date. Pt will benefit from PT services to address deficits in strength, balance, and mobility in order to return to full function at home and decrease her risk for falls.    OBJECTIVE IMPAIRMENTS: decreased balance and decreased strength.   ACTIVITY LIMITATIONS: standing, squatting, stairs, and transfers  PARTICIPATION LIMITATIONS: meal prep, cleaning, laundry, shopping, and community activity  PERSONAL FACTORS: Age, Past/current experiences, Time since onset of injury/illness/exacerbation, and 3+ comorbidities: memory change, fatigue, and NPH  are also affecting patient's functional outcome.   REHAB POTENTIAL: Fair    CLINICAL DECISION MAKING: Unstable/unpredictable  EVALUATION COMPLEXITY: High   GOALS: Goals reviewed with patient? Yes  SHORT TERM GOALS: Target date: 02/12/2024   Pt will be independent with HEP in order to improve strength and balance in order to decrease fall risk and improve function at home. Baseline: HEP provided  Goal status: IN progress   LONG TERM GOALS: Target date: 03/11/2024   1.  Pt will improve BERG by at least 3 points in order to demonstrate clinically significant improvement in balance.   Baseline: 19/56; 01/15/24: 19/56; Goal status: ONGOING  2.  Pt will improve ABC by at least 13% in order to demonstrate clinically significant improvement in balance confidence.      Baseline: 40%, 01/15/24: 65% Goal status: ACHIEVED  3. Pt will decrease 5TSTS by at least 3 seconds in order to demonstrate clinically significant improvement in LE strength      Baseline: 19.7s, 01/15/24: 15.9s; Goal status: ACHIEVED  4. Pt will decrease TUG to below 14 seconds/decrease in  order to demonstrate decreased fall risk.  Baseline: 19.3s, 01/15/24: 20.7s; Goal status: ONGOING   PLAN: PT FREQUENCY: 2x/week  PT DURATION: 8 weeks  PLANNED INTERVENTIONS:  Therapeutic exercises, Therapeutic activity, Neuromuscular re-education, Balance training, Gait training, Patient/Family education, Self Care, Joint mobilization, Joint manipulation, Vestibular training, Canalith repositioning, Orthotic/Fit training, DME instructions, Dry Needling, Electrical stimulation, Spinal manipulation, Spinal mobilization, Cryotherapy, Moist heat, Taping, Traction, Ultrasound, Ionotophoresis 4mg /ml Dexamethasone, Manual therapy, and Re-evaluation.  PLAN FOR NEXT SESSION: added seated ankle weight exercises, oculomotor/vestibular testing, progress balance and strength exercises, review/modify HEP as needed;   Sharalyn Ink Chamaine Stankus PT, DPT, GCS  Nilson Tabora 01/15/2024, 12:57 PM

## 2024-01-15 ENCOUNTER — Ambulatory Visit: Payer: Medicare Other

## 2024-01-15 DIAGNOSIS — R262 Difficulty in walking, not elsewhere classified: Secondary | ICD-10-CM

## 2024-01-15 DIAGNOSIS — R2681 Unsteadiness on feet: Secondary | ICD-10-CM | POA: Diagnosis not present

## 2024-01-15 DIAGNOSIS — M6281 Muscle weakness (generalized): Secondary | ICD-10-CM

## 2024-01-16 NOTE — Therapy (Signed)
 OUTPATIENT PHYSICAL THERAPY BALANCE TREATMENT   Patient Name: Crystal Lynch MRN: [id number] DOB:1941/05/28, 83 y.o., female Today's Date: 01/18/2024  END OF SESSION:  PT End of Session - 01/17/24 0947     Visit Number 12    Number of Visits 33    Date for PT Re-Evaluation 03/11/24    Authorization Type eval: 11/19/23    PT Start Time 0935    PT Stop Time 1020    PT Time Calculation (min) 45 min    Equipment Utilized During Treatment Gait belt    Activity Tolerance Patient tolerated treatment well    Behavior During Therapy WFL for tasks assessed/performed            Past Medical History:  Diagnosis Date   Arthritis    Chicken pox    Endometriosis    Esophagitis    s/p esophageal dilatation   GERD (gastroesophageal reflux disease)    History of ovarian cyst    Hypercholesterolemia    Hypertension    Hypothyroidism    Past Surgical History:  Procedure Laterality Date   ABDOMINAL SURGERY     found to have an ovarian cyst and endometriosis   APPENDECTOMY     Patient Active Problem List   Diagnosis Date Noted   Low back pain 12/10/2023   Postnasal drip 12/10/2023   Bursitis of right hip 09/12/2023   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   Stress 07/27/2022   Unsteady gait 07/10/2022   Hypophosphatemia 06/11/2022   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   OSA (obstructive sleep apnea) 06/09/2022   Kidney lesion 12/14/2021   Left hip pain 12/13/2021   Memory change 11/27/2021   Fatigue 07/23/2021   Aortic atherosclerosis (HCC) 04/17/2021   Right acute serous otitis media    Hyponatremia 02/23/2021   Prolonged QT interval 02/23/2021   Dizziness 09/12/2020   Fall 03/08/2020   Fatty liver 12/28/2019   Chest pain 07/25/2019   Frequent urinary tract infections 12/24/2018   UTI (urinary tract infection) 10/11/2018   Hypercalcemia 01/09/2017   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   Sleeping difficulties 03/08/2015   Health care maintenance 01/10/2015    Osteopenia 08/10/2014   Hyperbilirubinemia 10/27/2012   Hematuria 10/27/2012   Hypertension 10/21/2012   Hypercholesterolemia 10/21/2012   Hypothyroidism 10/21/2012   Esophagitis 10/21/2012    PCP: Dale [place], MD  REFERRING PROVIDER: Dale [place], MD   REFERRING DIAG: R26.81 (ICD-10-CM) - Unsteady gait   RATIONALE FOR EVALUATION AND TREATMENT: Rehabilitation  THERAPY DIAG: Difficulty in walking, not elsewhere classified  Muscle weakness (generalized)  ONSET DATE: Chronic x years  FOLLOW-UP APPT SCHEDULED WITH REFERRING PROVIDER: Yes   FROM INITIAL EVALUATION SUBJECTIVE:  SUBJECTIVE STATEMENT:  Unsteadiness  PERTINENT HISTORY:  Pt referred to PT for unsteadiness and dizziness. History today obtained from both patient and husband. "I'm dizzy all the time." She denies vertigo but reports persistent unsteadiness with occasional lightheadedness. Pt has a history of NPH. She underwent a shunt trial at [facility] but there was not enough change in her testing to merit a permanent shunt placement. She is currently being followed by [facility] neurology and has a follow-up appointment this Wednesday. 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 She had a alpha-synuclein skin biopsy, EMG/NCS, echo, and CTA head and neck. 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 Recommended baby aspirin and CTA 6 months. 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 No evidence of motor neuron disease.  Pt reports shuffling gait and freezing episodes but no tremor. She was started on carbidopa-levodopa but has not noticed any change in her symptoms since starting. She uses a rollator at home and front wheeled walker when out in the community. Pt wears eyeglasses all the time and has an upcoming  check-up with optometry. History of occasional sinus headaches but no history of migraines. Pt reports some L hip weakness.  Pain: Yes, L hip pain Numbness/Tingling: No Focal Weakness: Yes, L hip weakness related to pain; Recent changes in overall health/medication: Yes, carvidopa/levodopa Prior history of physical therapy for balance:  Yes Dominant hand: right Red flags: no personal history of cancer, abdominal pain, chills/fever, night sweats, nausea, vomiting, unrelenting pain  PRECAUTIONS: None  WEIGHT BEARING RESTRICTIONS: No  FALLS: Has patient fallen in last 6 months? Yes. Number of falls 7-8 , Directional pattern for falls: Yes Backwards  , often occurs when turning.   Living Environment Lives with: lives with their spouse Lives in: House/apartment Stairs: 2 to enter, railing on left during ascend Has following equipment at home: [facility] over commode, walk-in shower with seat and grab bars. Wheelchair for extended distances.   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   Hobbies: Enjoys crafting, reading.   Patient Goals: Build confidence in her balance   OBJECTIVE:   Patient Surveys  ABC: 40%  Cognition Patient is oriented to person, place, and time.  Recent memory is grossly intact however some memory deficit is evident. Remote memory is intact.  Attention span and concentration are intact.  Expressive speech is intact.  Patient's fund of knowledge is within normal limits for educational level.    Gross Musculoskeletal Assessment Tremor: None Bulk: Normal Tone: Normal  Posture: No gross abnormalities noted in standing or seated posture with the exception of some mild forward head.  AROM Deferred  LE MMT: MMT (out of 5) Right  Left   Hip flexion 5 4+  Hip extension    Hip abduction (seated) 4+ 4  Hip adduction 4+ 4  Hip internal rotation    Hip external rotation     Knee flexion (seated) 5 5  Knee extension 5 5  Ankle dorsiflexion 5 5  (* = pain; Blank rows = not tested)  Sensation Deferred  Reflexes Deferred  Cranial Nerves Deferred  Coordination/Cerebellar Finger to Nose: Mild dysmetria LUE Heel to Shin: Deferred Rapid alternating movements: Abnormal LUE Finger Opposition: WNL Pronator Drift: Negative  Bed mobility: Deferred  Transfers: Assistive device utilized: Environmental consultant - 2 wheeled  Sit to stand: Modified independence Stand to sit: Modified independence Chair to chair: Modified independence Floor: Deferred  Curb:  Deferred  Stairs: Level of Assistance: Modified independence Stair Negotiation Technique: Step to Pattern with Bilateral Rails Number of Stairs: 4  Height of Stairs: 6"  Comments: Pt moves slowly and leans backwards during descent  Gait: Gait pattern: step through pattern, decreased step length- Right, decreased step length- Left, and shuffling Distance walked: 100' Assistive device utilized: Environmental consultant - 2 wheeled Level of assistance: Modified independence Comments: ataxic gait with shuffling steps;  mCTSIB: unable to complete, pt only able to stand for approximately 2-3s in condition 1;  Orthostatic vitals (11/26/23): Supine: BP: 151/66 mmHg, HR: 66 bpm, SpO2: 97% Seated: BP: 151/58 mmHg, HR: 68 bpm, SpO2: 97% Standing (1 minute): BP: 144/60 mmHg, HR: 71 bpm, SpO2: 98% Standing (3 minutes): BP: 149/63 mmHg, HR: 69 bpm, SpO2: 99%   BPPV TESTS (11/26/23):  Symptoms Duration Intensity Nystagmus  L Dix-Hallpike Dizziness/lightheadedness   None  R Dix-Hallpike Dizziness/lightheadedness   None  L Head Roll None   None  R Head Roll None   None  L Sidelying Test      R Sidelying Test      (blank = not tested)  Functional Outcome Measures  Results 11/26/23 Comments  BERG 19/56  High fall risk  DGI  8/24   FGA     TUG 19.3 seconds  Increased fall risk  5TSTS 19.7 seconds  Increased fall risk  6 Minute Walk Test      10 Meter Gait Speed Self-selected: 15.5s = 0.65 m/s; Fastest: 12.5s = 0.8 m/s  Below functional community ambulation speeds  (Blank rows = not tested)   TODAY'S TREATMENT   SUBJECTIVE: Pt reports that she is doing alright today. No falls since the last therapy session and no health changes. No specific pain reported upon arrival today and no specific concerns.   PAIN: Denies   Neuromuscular Re-education  NuStep L0-4 (seat 7, arms 9) x 10 minutes at start of session for warm-up, BLE strengthening, and interval history; Walking in rehab gym and hallway with metronome to encourage decreased cadence and larger steps; Seated large amplitude reaching down, up, and out x multiple bouts; Seated trunk rotation with lateral large amplitude reaching x multiple bouts; Standing large amplitude forward lunges with chest opening reaching x multiple bouts; Forward stepping in agility ladder with verbal and visual cues to increase step length x multiple bouts;   Not today  Standing heel raises x 10 without UE support; Seated LAQ without UE support x 10 BLE; 3 way hip Flex/Add/Ext Knee flex with GTB 2 x 10 reps  STS with Ball squeeze with min assist of PT 2 x 10 reps. Seated clams with manual resistance from therapist x 10 BLE Seated adductor squeezes with manual resistance from therapist x 10 BLE; Gait training with FWW. Alternating 6" step-ups with BUE support alternating leading LE x 10 BLE; Feet apart heel/toe raises without UE support x 15; Staggered balance on 6" step eyes open/closed x 30s each BLE; Feet apart static balance gradually progressing feet closer together eyes open/closed x 60s each; Feet apart static balance gradually progressing feet closer together horizontal and vertical head turns x 60s each; Staggered stance balance eyes open/closed x 30s each BLE; Staggered stance balance horizontal/vertical head turns x 30s each BLE; Blaze pods toe touch, 3 pods in front on ground,  with random pattern 3 x 60s;  Blaze pods toe touch, 3 pods in front on ground while seated, random pattern 2 x 60s (completed during seated rest breaks); Resisted gait with double  black tband and faded UE support forward and backward x 4 each direction;   PATIENT EDUCATION:  Education details: Pt educated throughout session about proper posture and technique with exercises. Improved exercise technique, movement at target joints, use of target muscles after min to mod verbal, visual, tactile cues. Balance exercises Person educated: Patient Education method: Explanation and Verbal cues Education comprehension: verbalized understanding, returned demonstration, and verbal cues required   HOME EXERCISE PROGRAM:  Access Code: [email or web address] URL: [email or web address] Date: 01/10/2024 Prepared by: Janet Berlin  Exercises - Supine Lower Trunk Rotation  - 3 x daily - 7 x weekly - 3 sets - 10 reps - Seated Hamstring Stretch  - 3 x daily - 7 x weekly - 1 sets - 10 reps - 10secs hold - Supine Double Knee to Chest  - 3 x daily - 7 x weekly - 1 sets - 10 reps - 10sec hold - Supine Double Knee to Chest  - 1 x daily - 7 x weekly - 1 sets - 10 reps - 10secs hold - Standing 3-Way Leg Reach with Resistance at Ankles and Counter Support  - 1 x daily - 7 x weekly - 3 sets - 10 reps - Seated Knee Extension with Resistance  - 1 x daily - 7 x weekly - 3 sets - 10 reps - Standing Hamstring Curl with Resistance  - 1 x daily - 7 x weekly - 3 sets - 10 reps   ASSESSMENT:  CLINICAL IMPRESSION: Performed additional balance exercises during session today with heavy focus on longer step lengths during gait. Utilized external pacing/cues with metronome and agility ladder. She continues to demonstrate difficulty initiating movement and significant difficulty with dual tasking. She also continues to experience freezing episodes when passing through doorways. Plan to progress balance and strength exercises at  future sessions while incorporating cognitive tasks. Pt encouraged to follow-up as scheduled. Pt will benefit from PT services to address deficits in strength, balance, and mobility in order to return to full function at home and decrease her risk for falls.  OBJECTIVE IMPAIRMENTS: decreased balance and decreased strength.   ACTIVITY LIMITATIONS: standing, squatting, stairs, and transfers  PARTICIPATION LIMITATIONS: meal prep, cleaning, laundry, shopping, and community activity  PERSONAL FACTORS: Age, Past/current experiences, Time since onset of injury/illness/exacerbation, and 3+ comorbidities: memory change, fatigue, and NPH  are also affecting patient's functional outcome.   REHAB POTENTIAL: Fair    CLINICAL DECISION MAKING: Unstable/unpredictable  EVALUATION COMPLEXITY: High   GOALS: Goals reviewed with patient? Yes  SHORT TERM GOALS: Target date: 02/12/2024   Pt will be independent with HEP in order to improve strength and balance in order to decrease fall risk and improve function at home. Baseline: HEP provided  Goal status: IN progress   LONG TERM GOALS: Target date: 03/11/2024   1.  Pt will improve BERG by at least 3 points in order to demonstrate clinically significant improvement in balance.   Baseline: 19/56; 01/15/24: 19/56; Goal status: ONGOING  2.  Pt will improve ABC by at least 13% in order to demonstrate clinically significant improvement in balance confidence.      Baseline: 40%, 01/15/24: 65% Goal status: ACHIEVED  3. Pt will decrease 5TSTS by at least 3 seconds in order to demonstrate clinically significant improvement in LE strength      Baseline: 19.7s, 01/15/24: 15.9s; Goal status: ACHIEVED  4. Pt will decrease TUG to below 14 seconds/decrease in order to demonstrate decreased fall risk.  Baseline: 19.3s, 01/15/24: 20.7s; Goal  status: ONGOING   PLAN: PT FREQUENCY: 2x/week  PT DURATION: 8 weeks  PLANNED INTERVENTIONS: Therapeutic exercises,  Therapeutic activity, Neuromuscular re-education, Balance training, Gait training, Patient/Family education, Self Care, Joint mobilization, Joint manipulation, Vestibular training, Canalith repositioning, Orthotic/Fit training, DME instructions, Dry Needling, Electrical stimulation, Spinal manipulation, Spinal mobilization, Cryotherapy, Moist heat, Taping, Traction, Ultrasound, Ionotophoresis 4mg /ml Dexamethasone, Manual therapy, and Re-evaluation.  PLAN FOR NEXT SESSION: added seated ankle weight exercises, oculomotor/vestibular testing, progress balance and strength exercises, review/modify HEP as needed;   Sharalyn Ink Amanada Philbrick PT, DPT, GCS  Gustaf Mccarter 01/18/2024, 12:43 PM

## 2024-01-17 ENCOUNTER — Ambulatory Visit: Payer: Medicare Other

## 2024-01-17 DIAGNOSIS — R262 Difficulty in walking, not elsewhere classified: Secondary | ICD-10-CM

## 2024-01-17 DIAGNOSIS — M6281 Muscle weakness (generalized): Secondary | ICD-10-CM

## 2024-01-17 DIAGNOSIS — R2681 Unsteadiness on feet: Secondary | ICD-10-CM | POA: Diagnosis not present

## 2024-01-22 NOTE — Therapy (Signed)
 OUTPATIENT PHYSICAL THERAPY BALANCE TREATMENT   Patient Name: Crystal Lynch MRN: [id number] DOB:11-19-1940, 83 y.o., female Today's Date: 01/23/2024  END OF SESSION:  PT End of Session - 01/23/24 1326     Visit Number 13    Number of Visits 33    Date for PT Re-Evaluation 03/11/24    Authorization Type eval: 11/19/23    PT Start Time 1317    PT Stop Time 1403    PT Time Calculation (min) 46 min    Equipment Utilized During Treatment Gait belt    Activity Tolerance Patient tolerated treatment well    Behavior During Therapy WFL for tasks assessed/performed            Past Medical History:  Diagnosis Date   Arthritis    Chicken pox    Endometriosis    Esophagitis    s/p esophageal dilatation   GERD (gastroesophageal reflux disease)    History of ovarian cyst    Hypercholesterolemia    Hypertension    Hypothyroidism    Past Surgical History:  Procedure Laterality Date   ABDOMINAL SURGERY     found to have an ovarian cyst and endometriosis   APPENDECTOMY     Patient Active Problem List   Diagnosis Date Noted   Low back pain 12/10/2023   Postnasal drip 12/10/2023   Bursitis of right hip 09/12/2023   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   Stress 07/27/2022   Unsteady gait 07/10/2022   Hypophosphatemia 06/11/2022   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   OSA (obstructive sleep apnea) 06/09/2022   Kidney lesion 12/14/2021   Left hip pain 12/13/2021   Memory change 11/27/2021   Fatigue 07/23/2021   Aortic atherosclerosis (HCC) 04/17/2021   Right acute serous otitis media    Hyponatremia 02/23/2021   Prolonged QT interval 02/23/2021   Dizziness 09/12/2020   Fall 03/08/2020   Fatty liver 12/28/2019   Chest pain 07/25/2019   Frequent urinary tract infections 12/24/2018   UTI (urinary tract infection) 10/11/2018   Hypercalcemia 01/09/2017   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   Sleeping difficulties 03/08/2015   Health care maintenance 01/10/2015    Osteopenia 08/10/2014   Hyperbilirubinemia 10/27/2012   Hematuria 10/27/2012   Hypertension 10/21/2012   Hypercholesterolemia 10/21/2012   Hypothyroidism 10/21/2012   Esophagitis 10/21/2012    PCP: Dale Weinert, MD  REFERRING PROVIDER: Dale [place], MD   REFERRING DIAG: R26.81 (ICD-10-CM) - Unsteady gait   RATIONALE FOR EVALUATION AND TREATMENT: Rehabilitation  THERAPY DIAG: Difficulty in walking, not elsewhere classified  Muscle weakness (generalized)  ONSET DATE: Chronic x years  FOLLOW-UP APPT SCHEDULED WITH REFERRING PROVIDER: Yes   FROM INITIAL EVALUATION SUBJECTIVE:  SUBJECTIVE STATEMENT:  Unsteadiness  PERTINENT HISTORY:  Pt referred to PT for unsteadiness and dizziness. History today obtained from both patient and husband. "I'm dizzy all the time." She denies vertigo but reports persistent unsteadiness with occasional lightheadedness. Pt has a history of NPH. She underwent a shunt trial at [facility] but there was not enough change in her testing to merit a permanent shunt placement. She is currently being followed by [facility] neurology and has a follow-up appointment this Wednesday. 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 She had a alpha-synuclein skin biopsy, EMG/NCS, echo, and CTA head and neck. 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 Recommended baby aspirin and CTA 6 months. 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 No evidence of motor neuron disease.  Pt reports shuffling gait and freezing episodes but no tremor. She was started on carbidopa-levodopa but has not noticed any change in her symptoms since starting. She uses a rollator at home and front wheeled walker when out in the community. Pt wears eyeglasses all the time and has an upcoming  check-up with optometry. History of occasional sinus headaches but no history of migraines. Pt reports some L hip weakness.  Pain: Yes, L hip pain Numbness/Tingling: No Focal Weakness: Yes, L hip weakness related to pain; Recent changes in overall health/medication: Yes, carvidopa/levodopa Prior history of physical therapy for balance:  Yes Dominant hand: right Red flags: no personal history of cancer, abdominal pain, chills/fever, night sweats, nausea, vomiting, unrelenting pain  PRECAUTIONS: None  WEIGHT BEARING RESTRICTIONS: No  FALLS: Has patient fallen in last 6 months? Yes. Number of falls 7-8 , Directional pattern for falls: Yes Backwards  , often occurs when turning.   Living Environment Lives with: lives with their spouse Lives in: House/apartment Stairs: 2 to enter, railing on left during ascend Has following equipment at home: [facility] over commode, walk-in shower with seat and grab bars. Wheelchair for extended distances.   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   Hobbies: Enjoys crafting, reading.   Patient Goals: Build confidence in her balance   OBJECTIVE:   Patient Surveys  ABC: 40%  Cognition Patient is oriented to person, place, and time.  Recent memory is grossly intact however some memory deficit is evident. Remote memory is intact.  Attention span and concentration are intact.  Expressive speech is intact.  Patient's fund of knowledge is within normal limits for educational level.    Gross Musculoskeletal Assessment Tremor: None Bulk: Normal Tone: Normal  Posture: No gross abnormalities noted in standing or seated posture with the exception of some mild forward head.  AROM Deferred  LE MMT: MMT (out of 5) Right  Left   Hip flexion 5 4+  Hip extension    Hip abduction (seated) 4+ 4  Hip adduction 4+ 4  Hip internal rotation    Hip external rotation     Knee flexion (seated) 5 5  Knee extension 5 5  Ankle dorsiflexion 5 5  (* = pain; Blank rows = not tested)  Sensation Deferred  Reflexes Deferred  Cranial Nerves Deferred  Coordination/Cerebellar Finger to Nose: Mild dysmetria LUE Heel to Shin: Deferred Rapid alternating movements: Abnormal LUE Finger Opposition: WNL Pronator Drift: Negative  Bed mobility: Deferred  Transfers: Assistive device utilized: Environmental consultant - 2 wheeled  Sit to stand: Modified independence Stand to sit: Modified independence Chair to chair: Modified independence Floor: Deferred  Curb:  Deferred  Stairs: Level of Assistance: Modified independence Stair Negotiation Technique: Step to Pattern with Bilateral Rails Number of Stairs: 4  Height of Stairs: 6"  Comments: Pt moves slowly and leans backwards during descent  Gait: Gait pattern: step through pattern, decreased step length- Right, decreased step length- Left, and shuffling Distance walked: 100' Assistive device utilized: Environmental consultant - 2 wheeled Level of assistance: Modified independence Comments: ataxic gait with shuffling steps;  mCTSIB: unable to complete, pt only able to stand for approximately 2-3s in condition 1;  Orthostatic vitals (11/26/23): Supine: BP: 151/66 mmHg, HR: 66 bpm, SpO2: 97% Seated: BP: 151/58 mmHg, HR: 68 bpm, SpO2: 97% Standing (1 minute): BP: 144/60 mmHg, HR: 71 bpm, SpO2: 98% Standing (3 minutes): BP: 149/63 mmHg, HR: 69 bpm, SpO2: 99%   BPPV TESTS (11/26/23):  Symptoms Duration Intensity Nystagmus  L Dix-Hallpike Dizziness/lightheadedness   None  R Dix-Hallpike Dizziness/lightheadedness   None  L Head Roll None   None  R Head Roll None   None  L Sidelying Test      R Sidelying Test      (blank = not tested)  Functional Outcome Measures  Results 11/26/23 Comments  BERG 19/56  High fall risk  DGI  8/24   FGA     TUG 19.3 seconds  Increased fall risk  5TSTS 19.7 seconds  Increased fall risk  6 Minute Walk Test      10 Meter Gait Speed Self-selected: 15.5s = 0.65 m/s; Fastest: 12.5s = 0.8 m/s  Below functional community ambulation speeds  (Blank rows = not tested)   TODAY'S TREATMENT    SUBJECTIVE: Pt reports that she is doing alright today. No falls since the last therapy session and no health changes. She feels like it has been a little easier transitioning from sit to stand at home recently. No specific pain reported upon arrival today and no specific concerns.   PAIN: Denies   Neuromuscular Re-education  NuStep L0-4 (seat 7, arms 9) x 10 minutes at start of session for warm-up, BLE strengthening, and interval history; Walking in rehab gym and hallway with metronome to encourage decreased cadence and larger steps; Seated large amplitude reaching down, up, and out x multiple bouts; Seated trunk rotation with lateral large amplitude reaching x multiple bouts;  Standing large amplitude forward lunges with chest opening reaching x multiple bouts; Sit to stand without UE support 2 x 10, cues for forward trunk lean; Forward and side stepping in agility ladder with verbal and visual cues to increase step length x multiple bouts each; 6" forward step ups alternating leading LE x 10 BLE; 12" forward step ups alternating leading LE x 5 BLE; Standing heel raise and hold 3s x 10 without UE support; Therapist walked pt out to car at end of session practicing gait through doorways and large amplitude stepping outside;   Not today  Seated LAQ without UE support x 10 BLE; 3 way hip Flex/Add/Ext Knee flex with GTB 2 x 10 reps  STS with Ball squeeze with min assist of PT 2 x 10 reps. Seated clams with manual resistance from therapist x 10 BLE Seated adductor squeezes with manual resistance from therapist x 10 BLE; Gait training with FWW. Alternating 6" step-ups with BUE support alternating leading LE x 10 BLE; Feet apart heel/toe raises without UE support x 15; Staggered balance on 6" step eyes  open/closed x 30s each BLE; Feet apart static balance gradually progressing feet closer together eyes open/closed x 60s each; Feet  apart static balance gradually progressing feet closer together horizontal and vertical head turns x 60s each; Staggered stance balance eyes open/closed x 30s each BLE; Staggered stance balance horizontal/vertical head turns x 30s each BLE; Blaze pods toe touch, 3 pods in front on ground, with random pattern 3 x 60s;  Blaze pods toe touch, 3 pods in front on ground while seated, random pattern 2 x 60s (completed during seated rest breaks); Resisted gait with double black tband and faded UE support forward and backward x 4 each direction;   PATIENT EDUCATION:  Education details: Pt educated throughout session about proper posture and technique with exercises. Improved exercise technique, movement at target joints, use of target muscles after min to mod verbal, visual, tactile cues. Balance exercises Person educated: Patient Education method: Explanation and Verbal cues Education comprehension: verbalized understanding, returned demonstration, and verbal cues required   HOME EXERCISE PROGRAM:  Access Code: [email or web address] URL: [email or web address] Date: 01/10/2024 Prepared by: Janet Berlin  Exercises - Supine Lower Trunk Rotation  - 3 x daily - 7 x weekly - 3 sets - 10 reps - Seated Hamstring Stretch  - 3 x daily - 7 x weekly - 1 sets - 10 reps - 10secs hold - Supine Double Knee to Chest  - 3 x daily - 7 x weekly - 1 sets - 10 reps - 10sec hold - Supine Double Knee to Chest  - 1 x daily - 7 x weekly - 1 sets - 10 reps - 10secs hold - Standing 3-Way Leg Reach with Resistance at Ankles and Counter Support  - 1 x daily - 7 x weekly - 3 sets - 10 reps - Seated Knee Extension with Resistance  - 1 x daily - 7 x weekly - 3 sets - 10 reps - Standing Hamstring Curl with Resistance  - 1 x daily - 7 x weekly - 3 sets - 10 reps   ASSESSMENT:  CLINICAL  IMPRESSION: Performed additional balance exercises during session today with heavy focus on longer step lengths during gait. Also continued to focus on large amplitude motions. Utilized external pacing/cues with metronome and agility ladder again today. She continues to demonstrate difficulty initiating movement. She also continues to experience freezing episodes when passing through doorways. Plan to progress balance and strength exercises at future sessions while incorporating cognitive tasks. Pt encouraged to follow-up as scheduled. Pt will benefit from PT services to address deficits in strength, balance, and mobility in order to return to full function at home and decrease her risk for falls.  OBJECTIVE IMPAIRMENTS: decreased balance and decreased strength.   ACTIVITY LIMITATIONS: standing, squatting, stairs, and transfers  PARTICIPATION LIMITATIONS: meal prep, cleaning, laundry, shopping, and community activity  PERSONAL FACTORS: Age, Past/current experiences, Time since onset of injury/illness/exacerbation, and 3+ comorbidities: memory change, fatigue, and NPH  are also affecting patient's functional outcome.   REHAB POTENTIAL: Fair    CLINICAL DECISION MAKING: Unstable/unpredictable  EVALUATION COMPLEXITY: High   GOALS: Goals reviewed with patient? Yes  SHORT TERM GOALS: Target date: 02/12/2024   Pt will be independent with HEP in order to improve strength and balance in order to decrease fall risk and improve function at home. Baseline: HEP provided  Goal status: IN progress   LONG TERM GOALS: Target date: 03/11/2024   1.  Pt will improve BERG by at least 3 points in order to demonstrate clinically significant improvement in balance.   Baseline: 19/56; 01/15/24: 19/56; Goal status: ONGOING  2.  Pt will improve ABC  by at least 13% in order to demonstrate clinically significant improvement in balance confidence.      Baseline: 40%, 01/15/24: 65% Goal status: ACHIEVED  3. Pt  will decrease 5TSTS by at least 3 seconds in order to demonstrate clinically significant improvement in LE strength      Baseline: 19.7s, 01/15/24: 15.9s; Goal status: ACHIEVED  4. Pt will decrease TUG to below 14 seconds/decrease in order to demonstrate decreased fall risk.  Baseline: 19.3s, 01/15/24: 20.7s; Goal status: ONGOING   PLAN: PT FREQUENCY: 2x/week  PT DURATION: 8 weeks  PLANNED INTERVENTIONS: Therapeutic exercises, Therapeutic activity, Neuromuscular re-education, Balance training, Gait training, Patient/Family education, Self Care, Joint mobilization, Joint manipulation, Vestibular training, Canalith repositioning, Orthotic/Fit training, DME instructions, Dry Needling, Electrical stimulation, Spinal manipulation, Spinal mobilization, Cryotherapy, Moist heat, Taping, Traction, Ultrasound, Ionotophoresis 4mg /ml Dexamethasone, Manual therapy, and Re-evaluation.  PLAN FOR NEXT SESSION: added seated ankle weight exercises, oculomotor/vestibular testing, progress balance and strength exercises, review/modify HEP as needed;   Sharalyn Ink Krista Som PT, DPT, GCS  Tiki Tucciarone 01/23/2024, 2:13 PM

## 2024-01-23 ENCOUNTER — Ambulatory Visit: Attending: Internal Medicine

## 2024-01-23 DIAGNOSIS — M6281 Muscle weakness (generalized): Secondary | ICD-10-CM | POA: Diagnosis present

## 2024-01-23 DIAGNOSIS — R262 Difficulty in walking, not elsewhere classified: Secondary | ICD-10-CM

## 2024-01-24 ENCOUNTER — Ambulatory Visit

## 2024-01-30 NOTE — Therapy (Signed)
 OUTPATIENT PHYSICAL THERAPY BALANCE TREATMENT   Patient Name: Crystal Lynch MRN: [id number] DOB:[birth year], 83 y.o., female Today's Date: 01/30/2024  END OF SESSION:   Past Medical History:  Diagnosis Date   Arthritis    Chicken pox    Endometriosis    Esophagitis    s/p esophageal dilatation   GERD (gastroesophageal reflux disease)    History of ovarian cyst    Hypercholesterolemia    Hypertension    Hypothyroidism    Past Surgical History:  Procedure Laterality Date   ABDOMINAL SURGERY     found to have an ovarian cyst and endometriosis   APPENDECTOMY     Patient Active Problem List   Diagnosis Date Noted   Low back pain 12/10/2023   Postnasal drip 12/10/2023   Bursitis of right hip 09/12/2023   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   Stress 07/27/2022   Unsteady gait 07/10/2022   Hypophosphatemia 06/11/2022   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   OSA (obstructive sleep apnea) 06/09/2022   Kidney lesion 12/14/2021   Left hip pain 12/13/2021   Memory change 11/27/2021   Fatigue 07/23/2021   Aortic atherosclerosis (HCC) 04/17/2021   Right acute serous otitis media    Hyponatremia 02/23/2021   Prolonged QT interval 02/23/2021   Dizziness 09/12/2020   Fall 03/08/2020   Fatty liver 12/28/2019   Chest pain 07/25/2019   Frequent urinary tract infections 12/24/2018   UTI (urinary tract infection) 10/11/2018   Hypercalcemia 01/09/2017   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   Sleeping difficulties 03/08/2015   Health care maintenance 01/10/2015   Osteopenia 08/10/2014   Hyperbilirubinemia 10/27/2012   Hematuria 10/27/2012   Hypertension 10/21/2012   Hypercholesterolemia 10/21/2012   Hypothyroidism 10/21/2012   Esophagitis 10/21/2012    PCP: Dale Ashton, MD  REFERRING PROVIDER: Dale [place], MD   REFERRING DIAG: R26.81 (ICD-10-CM) - Unsteady gait   RATIONALE FOR EVALUATION AND TREATMENT: Rehabilitation  THERAPY DIAG: Difficulty in  walking, not elsewhere classified  Muscle weakness (generalized)  ONSET DATE: Chronic x years  FOLLOW-UP APPT SCHEDULED WITH REFERRING PROVIDER: Yes   FROM INITIAL EVALUATION SUBJECTIVE:                                                                                                                                                                                         SUBJECTIVE STATEMENT:  Unsteadiness  PERTINENT HISTORY:  Pt referred to PT for unsteadiness and dizziness. History today obtained from both patient and husband. "I'm dizzy all the time." She denies vertigo but reports persistent unsteadiness with occasional lightheadedness. Pt has a history of NPH. She underwent a shunt trial at [facility] but there was  not enough change in her testing to merit a permanent shunt placement. She is currently being followed by [facility] neurology and has a follow-up appointment this Wednesday. 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 She had a alpha-synuclein skin biopsy, EMG/NCS, echo, and CTA head and neck. 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 Recommended baby aspirin and CTA 6 months. 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 No evidence of motor neuron disease.  Pt reports shuffling gait and freezing episodes but no tremor. She was started on carbidopa-levodopa but has not noticed any change in her symptoms since starting. She uses a rollator at home and front wheeled walker when out in the community. Pt wears eyeglasses all the time and has an upcoming check-up with optometry. History of occasional sinus headaches but no history of migraines. Pt reports some L hip weakness.  Pain: Yes, L hip pain Numbness/Tingling: No Focal Weakness: Yes, L hip weakness related to pain; Recent changes in overall health/medication: Yes, carvidopa/levodopa Prior history of physical therapy for balance:  Yes Dominant hand: right Red flags: no personal history of cancer,  abdominal pain, chills/fever, night sweats, nausea, vomiting, unrelenting pain  PRECAUTIONS: None  WEIGHT BEARING RESTRICTIONS: No  FALLS: Has patient fallen in last 6 months? Yes. Number of falls 7-8 , Directional pattern for falls: Yes Backwards  , often occurs when turning.   Living Environment Lives with: lives with their spouse Lives in: House/apartment Stairs: 2 to enter, railing on left during ascend Has following equipment at home: [facility] over commode, walk-in shower with seat and grab bars. Wheelchair for extended distances.   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   Hobbies: Enjoys crafting, reading.   Patient Goals: Build confidence in her balance   OBJECTIVE:   Patient Surveys  ABC: 40%  Cognition Patient is oriented to person, place, and time.  Recent memory is grossly intact however some memory deficit is evident. Remote memory is intact.  Attention span and concentration are intact.  Expressive speech is intact.  Patient's fund of knowledge is within normal limits for educational level.    Gross Musculoskeletal Assessment Tremor: None Bulk: Normal Tone: Normal  Posture: No gross abnormalities noted in standing or seated posture with the exception of some mild forward head.  AROM Deferred  LE MMT: MMT (out of 5) Right  Left   Hip flexion 5 4+  Hip extension    Hip abduction (seated) 4+ 4  Hip adduction 4+ 4  Hip internal rotation    Hip external rotation    Knee flexion (seated) 5 5  Knee extension 5 5  Ankle dorsiflexion 5 5  (* = pain; Blank rows = not tested)  Sensation Deferred  Reflexes Deferred  Cranial Nerves Deferred  Coordination/Cerebellar Finger to Nose: Mild dysmetria LUE Heel to Shin: Deferred Rapid alternating movements: Abnormal LUE Finger Opposition: WNL Pronator Drift: Negative  Bed mobility: Deferred  Transfers: Assistive  device utilized: Environmental consultant - 2 wheeled  Sit to stand: Modified independence Stand to sit: Modified independence Chair to chair: Modified independence Floor: Deferred  Curb:  Deferred  Stairs: Level of Assistance: Modified independence Stair Negotiation Technique: Step to Pattern with Bilateral Rails Number of Stairs: 4  Height of Stairs: 6"  Comments: Pt moves slowly and leans backwards during descent  Gait: Gait pattern: step through pattern, decreased step length- Right, decreased step length- Left, and shuffling Distance walked: 100' Assistive  device utilized: Environmental consultant - 2 wheeled Level of assistance: Modified independence Comments: ataxic gait with shuffling steps;  mCTSIB: unable to complete, pt only able to stand for approximately 2-3s in condition 1;  Orthostatic vitals (11/26/23): Supine: BP: 151/66 mmHg, HR: 66 bpm, SpO2: 97% Seated: BP: 151/58 mmHg, HR: 68 bpm, SpO2: 97% Standing (1 minute): BP: 144/60 mmHg, HR: 71 bpm, SpO2: 98% Standing (3 minutes): BP: 149/63 mmHg, HR: 69 bpm, SpO2: 99%   BPPV TESTS (11/26/23):  Symptoms Duration Intensity Nystagmus  L Dix-Hallpike Dizziness/lightheadedness   None  R Dix-Hallpike Dizziness/lightheadedness   None  L Head Roll None   None  R Head Roll None   None  L Sidelying Test      R Sidelying Test      (blank = not tested)  Functional Outcome Measures  Results 11/26/23 Comments  BERG 19/56  High fall risk  DGI  8/24   FGA     TUG 19.3 seconds  Increased fall risk  5TSTS 19.7 seconds  Increased fall risk  6 Minute Walk Test     10 Meter Gait Speed Self-selected: 15.5s = 0.65 m/s; Fastest: 12.5s = 0.8 m/s  Below functional community ambulation speeds  (Blank rows = not tested)   TODAY'S TREATMENT    SUBJECTIVE: Pt reports that she is doing alright today. No falls since the last therapy session and no health changes. She feels like it has been a little easier transitioning from sit to stand at home recently. No specific pain  reported upon arrival today and no specific concerns.   PAIN: Denies   Neuromuscular Re-education  NuStep L0-4 (seat 7, arms 9) x 10 minutes at start of session for warm-up, BLE strengthening, and interval history; Walking in rehab gym and hallway with metronome to encourage decreased cadence and larger steps; Seated large amplitude reaching down, up, and out x multiple bouts; Seated trunk rotation with lateral large amplitude reaching x multiple bouts;  Standing large amplitude forward lunges with chest opening reaching x multiple bouts; Sit to stand without UE support 2 x 10, cues for forward trunk lean; Forward and side stepping in agility ladder with verbal and visual cues to increase step length x multiple bouts each; 6" forward step ups alternating leading LE x 10 BLE; 12" forward step ups alternating leading LE x 5 BLE; Standing heel raise and hold 3s x 10 without UE support; Therapist walked pt out to car at end of session practicing gait through doorways and large amplitude stepping outside;   Not today  Seated LAQ without UE support x 10 BLE; 3 way hip Flex/Add/Ext Knee flex with GTB 2 x 10 reps  STS with Ball squeeze with min assist of PT 2 x 10 reps. Seated clams with manual resistance from therapist x 10 BLE Seated adductor squeezes with manual resistance from therapist x 10 BLE; Gait training with FWW. Alternating 6" step-ups with BUE support alternating leading LE x 10 BLE; Feet apart heel/toe raises without UE support x 15; Staggered balance on 6" step eyes open/closed x 30s each BLE; Feet apart static balance gradually progressing feet closer together eyes open/closed x 60s each; Feet apart static balance gradually progressing feet closer together horizontal and vertical head turns x 60s each; Staggered stance balance eyes open/closed x 30s each BLE; Staggered stance balance horizontal/vertical head turns x 30s each BLE; Blaze pods toe touch, 3 pods in front on  ground, with random pattern 3 x 60s;  Blaze pods toe  touch, 3 pods in front on ground while seated, random pattern 2 x 60s (completed during seated rest breaks); Resisted gait with double black tband and faded UE support forward and backward x 4 each direction;   PATIENT EDUCATION:  Education details: Pt educated throughout session about proper posture and technique with exercises. Improved exercise technique, movement at target joints, use of target muscles after min to mod verbal, visual, tactile cues. Balance exercises Person educated: Patient Education method: Explanation and Verbal cues Education comprehension: verbalized understanding, returned demonstration, and verbal cues required   HOME EXERCISE PROGRAM:  Access Code: [email or web address] URL: [email or web address] Date: 01/10/2024 Prepared by: Janet Berlin  Exercises - Supine Lower Trunk Rotation  - 3 x daily - 7 x weekly - 3 sets - 10 reps - Seated Hamstring Stretch  - 3 x daily - 7 x weekly - 1 sets - 10 reps - 10secs hold - Supine Double Knee to Chest  - 3 x daily - 7 x weekly - 1 sets - 10 reps - 10sec hold - Supine Double Knee to Chest  - 1 x daily - 7 x weekly - 1 sets - 10 reps - 10secs hold - Standing 3-Way Leg Reach with Resistance at Ankles and Counter Support  - 1 x daily - 7 x weekly - 3 sets - 10 reps - Seated Knee Extension with Resistance  - 1 x daily - 7 x weekly - 3 sets - 10 reps - Standing Hamstring Curl with Resistance  - 1 x daily - 7 x weekly - 3 sets - 10 reps   ASSESSMENT:  CLINICAL IMPRESSION: Performed additional balance exercises during session today with heavy focus on longer step lengths during gait. Also continued to focus on large amplitude motions. Utilized external pacing/cues with metronome and agility ladder again today. She continues to demonstrate difficulty initiating movement. She also continues to experience freezing episodes when passing through doorways. Plan to progress balance and  strength exercises at future sessions while incorporating cognitive tasks. Pt encouraged to follow-up as scheduled. Pt will benefit from PT services to address deficits in strength, balance, and mobility in order to return to full function at home and decrease her risk for falls.  OBJECTIVE IMPAIRMENTS: decreased balance and decreased strength.   ACTIVITY LIMITATIONS: standing, squatting, stairs, and transfers  PARTICIPATION LIMITATIONS: meal prep, cleaning, laundry, shopping, and community activity  PERSONAL FACTORS: Age, Past/current experiences, Time since onset of injury/illness/exacerbation, and 3+ comorbidities: memory change, fatigue, and NPH  are also affecting patient's functional outcome.   REHAB POTENTIAL: Fair    CLINICAL DECISION MAKING: Unstable/unpredictable  EVALUATION COMPLEXITY: High   GOALS: Goals reviewed with patient? Yes  SHORT TERM GOALS: Target date: 02/12/2024   Pt will be independent with HEP in order to improve strength and balance in order to decrease fall risk and improve function at home. Baseline: HEP provided  Goal status: IN progress   LONG TERM GOALS: Target date: 03/11/2024   1.  Pt will improve BERG by at least 3 points in order to demonstrate clinically significant improvement in balance.   Baseline: 19/56; 01/15/24: 19/56; Goal status: ONGOING  2.  Pt will improve ABC by at least 13% in order to demonstrate clinically significant improvement in balance confidence.      Baseline: 40%, 01/15/24: 65% Goal status: ACHIEVED  3. Pt will decrease 5TSTS by at least 3 seconds in order to demonstrate clinically significant improvement in LE strength      Baseline: 19.7s, 01/15/24:  15.9s; Goal status: ACHIEVED  4. Pt will decrease TUG to below 14 seconds/decrease in order to demonstrate decreased fall risk.  Baseline: 19.3s, 01/15/24: 20.7s; Goal status: ONGOING   PLAN: PT FREQUENCY: 2x/week  PT DURATION: 8 weeks  PLANNED INTERVENTIONS:  Therapeutic exercises, Therapeutic activity, Neuromuscular re-education, Balance training, Gait training, Patient/Family education, Self Care, Joint mobilization, Joint manipulation, Vestibular training, Canalith repositioning, Orthotic/Fit training, DME instructions, Dry Needling, Electrical stimulation, Spinal manipulation, Spinal mobilization, Cryotherapy, Moist heat, Taping, Traction, Ultrasound, Ionotophoresis 4mg /ml Dexamethasone, Manual therapy, and Re-evaluation.  PLAN FOR NEXT SESSION: added seated ankle weight exercises, oculomotor/vestibular testing, progress balance and strength exercises, review/modify HEP as needed;   Sharalyn Ink Aaylah Pokorny PT, DPT, GCS  Harbour Nordmeyer 01/30/2024, 10:05 PM

## 2024-01-31 ENCOUNTER — Ambulatory Visit

## 2024-01-31 DIAGNOSIS — M6281 Muscle weakness (generalized): Secondary | ICD-10-CM

## 2024-01-31 DIAGNOSIS — R262 Difficulty in walking, not elsewhere classified: Secondary | ICD-10-CM | POA: Diagnosis not present

## 2024-02-05 ENCOUNTER — Ambulatory Visit

## 2024-02-05 DIAGNOSIS — R262 Difficulty in walking, not elsewhere classified: Secondary | ICD-10-CM

## 2024-02-05 DIAGNOSIS — M6281 Muscle weakness (generalized): Secondary | ICD-10-CM

## 2024-02-05 NOTE — Therapy (Signed)
 OUTPATIENT PHYSICAL THERAPY BALANCE TREATMENT   Patient Name: Crystal Lynch MRN: [id number] DOB:12-Mar-1941, 83 y.o., female Today's Date: 02/06/2024  END OF SESSION:  PT End of Session - 02/05/24 1626     Visit Number 15    Number of Visits 33    Date for PT Re-Evaluation 03/11/24    Authorization Type eval: 11/19/23    PT Start Time 1615    PT Stop Time 1700    PT Time Calculation (min) 45 min    Equipment Utilized During Treatment Gait belt    Activity Tolerance Patient tolerated treatment well    Behavior During Therapy WFL for tasks assessed/performed            Past Medical History:  Diagnosis Date   Arthritis    Chicken pox    Endometriosis    Esophagitis    s/p esophageal dilatation   GERD (gastroesophageal reflux disease)    History of ovarian cyst    Hypercholesterolemia    Hypertension    Hypothyroidism    Past Surgical History:  Procedure Laterality Date   ABDOMINAL SURGERY     found to have an ovarian cyst and endometriosis   APPENDECTOMY     Patient Active Problem List   Diagnosis Date Noted   Low back pain 12/10/2023   Postnasal drip 12/10/2023   Bursitis of right hip 09/12/2023   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   Stress 07/27/2022   Unsteady gait 07/10/2022   Hypophosphatemia 06/11/2022   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   OSA (obstructive sleep apnea) 06/09/2022   Kidney lesion 12/14/2021   Left hip pain 12/13/2021   Memory change 11/27/2021   Fatigue 07/23/2021   Aortic atherosclerosis (HCC) 04/17/2021   Right acute serous otitis media    Hyponatremia 02/23/2021   Prolonged QT interval 02/23/2021   Dizziness 09/12/2020   Fall 03/08/2020   Fatty liver 12/28/2019   Chest pain 07/25/2019   Frequent urinary tract infections 12/24/2018   UTI (urinary tract infection) 10/11/2018   Hypercalcemia 01/09/2017   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   Sleeping difficulties 03/08/2015   Health care maintenance 01/10/2015    Osteopenia 08/10/2014   Hyperbilirubinemia 10/27/2012   Hematuria 10/27/2012   Hypertension 10/21/2012   Hypercholesterolemia 10/21/2012   Hypothyroidism 10/21/2012   Esophagitis 10/21/2012    PCP: Dellar Fenton, MD  REFERRING PROVIDER: Dellar Fenton, MD   REFERRING DIAG: R26.81 (ICD-10-CM) - Unsteady gait   RATIONALE FOR EVALUATION AND TREATMENT: Rehabilitation  THERAPY DIAG: Difficulty in walking, not elsewhere classified  Muscle weakness (generalized)  ONSET DATE: Chronic x years  FOLLOW-UP APPT SCHEDULED WITH REFERRING PROVIDER: Yes   FROM INITIAL EVALUATION SUBJECTIVE:  SUBJECTIVE STATEMENT:  Unsteadiness  PERTINENT HISTORY:  Pt referred to PT for unsteadiness and dizziness. History today obtained from both patient and husband. "I'm dizzy all the time." She denies vertigo but reports persistent unsteadiness with occasional lightheadedness. Pt has a history of NPH. She underwent a shunt trial at [facility] but there was not enough change in her testing to merit a permanent shunt placement. She is currently being followed by [facility] neurology and has a follow-up appointment this Wednesday. 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 She had a alpha-synuclein skin biopsy, EMG/NCS, echo, and CTA head and neck. 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 Recommended baby aspirin and CTA 6 months. 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 No evidence of motor neuron disease.  Pt reports shuffling gait and freezing episodes but no tremor. She was started on carbidopa-levodopa but has not noticed any change in her symptoms since starting. She uses a rollator at home and front wheeled walker when out in the community. Pt wears eyeglasses all the time and has an upcoming  check-up with optometry. History of occasional sinus headaches but no history of migraines. Pt reports some L hip weakness.  Pain: Yes, L hip pain Numbness/Tingling: No Focal Weakness: Yes, L hip weakness related to pain; Recent changes in overall health/medication: Yes, carvidopa/levodopa Prior history of physical therapy for balance:  Yes Dominant hand: right Red flags: no personal history of cancer, abdominal pain, chills/fever, night sweats, nausea, vomiting, unrelenting pain  PRECAUTIONS: None  WEIGHT BEARING RESTRICTIONS: No  FALLS: Has patient fallen in last 6 months? Yes. Number of falls 7-8 , Directional pattern for falls: Yes Backwards  , often occurs when turning.   Living Environment Lives with: lives with their spouse Lives in: House/apartment Stairs: 2 to enter, railing on left during ascend Has following equipment at home: [facility] over commode, walk-in shower with seat and grab bars. Wheelchair for extended distances.   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   Hobbies: Enjoys crafting, reading.   Patient Goals: Build confidence in her balance   OBJECTIVE:   Patient Surveys  ABC: 40%  Cognition Patient is oriented to person, place, and time.  Recent memory is grossly intact however some memory deficit is evident. Remote memory is intact.  Attention span and concentration are intact.  Expressive speech is intact.  Patient's fund of knowledge is within normal limits for educational level.    Gross Musculoskeletal Assessment Tremor: None Bulk: Normal Tone: Normal  Posture: No gross abnormalities noted in standing or seated posture with the exception of some mild forward head.  AROM Deferred  LE MMT: MMT (out of 5) Right  Left   Hip flexion 5 4+  Hip extension    Hip abduction (seated) 4+ 4  Hip adduction 4+ 4  Hip internal rotation    Hip external rotation     Knee flexion (seated) 5 5  Knee extension 5 5  Ankle dorsiflexion 5 5  (* = pain; Blank rows = not tested)  Sensation Deferred  Reflexes Deferred  Cranial Nerves Deferred  Coordination/Cerebellar Finger to Nose: Mild dysmetria LUE Heel to Shin: Deferred Rapid alternating movements: Abnormal LUE Finger Opposition: WNL Pronator Drift: Negative  Bed mobility: Deferred  Transfers: Assistive device utilized: Environmental consultant - 2 wheeled  Sit to stand: Modified independence Stand to sit: Modified independence Chair to chair: Modified independence Floor: Deferred  Curb:  Deferred  Stairs: Level of Assistance: Modified independence Stair Negotiation Technique: Step to Pattern with Bilateral Rails Number of Stairs: 4  Height of Stairs: 6"  Comments: Pt moves slowly and leans backwards during descent  Gait: Gait pattern: step through pattern, decreased step length- Right, decreased step length- Left, and shuffling Distance walked: 100' Assistive device utilized: Environmental consultant - 2 wheeled Level of assistance: Modified independence Comments: ataxic gait with shuffling steps;  mCTSIB: unable to complete, pt only able to stand for approximately 2-3s in condition 1;  Orthostatic vitals (11/26/23): Supine: BP: 151/66 mmHg, HR: 66 bpm, SpO2: 97% Seated: BP: 151/58 mmHg, HR: 68 bpm, SpO2: 97% Standing (1 minute): BP: 144/60 mmHg, HR: 71 bpm, SpO2: 98% Standing (3 minutes): BP: 149/63 mmHg, HR: 69 bpm, SpO2: 99%   BPPV TESTS (11/26/23):  Symptoms Duration Intensity Nystagmus  L Dix-Hallpike Dizziness/lightheadedness   None  R Dix-Hallpike Dizziness/lightheadedness   None  L Head Roll None   None  R Head Roll None   None  L Sidelying Test      R Sidelying Test      (blank = not tested)  Functional Outcome Measures  Results 11/26/23 Comments  BERG 19/56  High fall risk  DGI  8/24   FGA     TUG 19.3 seconds  Increased fall risk  5TSTS 19.7 seconds  Increased fall risk  6 Minute Walk Test      10 Meter Gait Speed Self-selected: 15.5s = 0.65 m/s; Fastest: 12.5s = 0.8 m/s  Below functional community ambulation speeds  (Blank rows = not tested)   TODAY'S TREATMENT    SUBJECTIVE: Pt reports that she is doing alright today. No falls since the last therapy session and no health changes. No specific pain reported upon arrival today and no specific concerns.   PAIN: Denies   Neuromuscular Re-education  NuStep L0-4 (seat 7, arms 9) x 10 minutes at start of session for warm-up, BLE strengthening, and interval history. Therapist adjusting resistance to modify challenge; Alternating 6" step taps without UE support x 10 BLE, repeated cues for anterior weight shifting; Alternating 6" step-ups with BUE support alternating leading LE with faded UE support x 10 BLE; Walking in rehab gym and hallway with metronome to encourage decreased cadence and larger steps, practiced slow turning at the end of the hallway with concentration on preventing posterior weight shifting and LOB. Also added backward stepping x 20' which is very hard for patient. Performed horizontal and vertical head turns as well during gait; 6" forward and lateral hurdle stepping;   Ther-ex  Stand exercises with 5# ankle weights (AW): Hip flexion marching down parallel bars x 4 lengths; Hamstring curls x 10 BLE; Hip abduction x 10 BLE; Hip extension x 10 BLE;  Seated LAQ with 5# AW 2 x 10 BLE; Sit to stand without UE support 2 x 10, cues for forward trunk lean; Side stepping with 5# AW, BUE support, x multiple laps;   Not performed: Knee flex with GTB 2 x 10 reps  Seated clams with manual resistance from therapist x 10 BLE Seated adductor squeezes with manual resistance from therapist x 10 BLE; Feet apart heel/toe raises without UE support x 15; Staggered balance on 6" step eyes open/closed x 30s each BLE; Staggered stance balance eyes open/closed x 30s each BLE; Staggered stance balance horizontal/vertical head  turns x 30s each BLE; Blaze pods toe touch, 3 pods in front on ground, with random pattern 3 x 60s;  Blaze pods toe touch,  3 pods in front on ground while seated, random pattern 2 x 60s (completed during seated rest breaks); Resisted gait with double black tband and faded UE support forward and backward x 4 each direction; Seated large amplitude reaching down, up, and out x multiple bouts; Seated trunk rotation with lateral large amplitude reaching x multiple bouts;  Standing large amplitude forward lunges with chest opening reaching x multiple bouts; Forward and side stepping in agility ladder with verbal and visual cues to increase step length x multiple bouts each; 12" forward step ups alternating leading LE x 5 BLE; Standing heel raise and hold 3s x 10 without UE support;   PATIENT EDUCATION:  Education details: Pt educated throughout session about proper posture and technique with exercises. Improved exercise technique, movement at target joints, use of target muscles after min to mod verbal, visual, tactile cues. Balance exercises Person educated: Patient Education method: Explanation and Verbal cues Education comprehension: verbalized understanding, returned demonstration, and verbal cues required   HOME EXERCISE PROGRAM:  Access Code: [email or web address] URL: [email or web address] Date: 01/10/2024 Prepared by: Ashley Lawrence  Exercises - Supine Lower Trunk Rotation  - 3 x daily - 7 x weekly - 3 sets - 10 reps - Seated Hamstring Stretch  - 3 x daily - 7 x weekly - 1 sets - 10 reps - 10secs hold - Supine Double Knee to Chest  - 3 x daily - 7 x weekly - 1 sets - 10 reps - 10sec hold - Supine Double Knee to Chest  - 1 x daily - 7 x weekly - 1 sets - 10 reps - 10secs hold - Standing 3-Way Leg Reach with Resistance at Ankles and Counter Support  - 1 x daily - 7 x weekly - 3 sets - 10 reps - Seated Knee Extension with Resistance  - 1 x daily - 7 x weekly - 3 sets - 10 reps - Standing  Hamstring Curl with Resistance  - 1 x daily - 7 x weekly - 3 sets - 10 reps   ASSESSMENT:  CLINICAL IMPRESSION: Performed additional balance exercises during session today with heavy focus on longer step lengths during gait as well as practice with turning.  Pt demonstrates improved stability with turning today. Incorporated backward walking as well as head turns during gait. Plan to progress balance and strength exercises at future sessions while incorporating cognitive tasks. Pt encouraged to follow-up as scheduled. Pt will benefit from PT services to address deficits in strength, balance, and mobility in order to return to full function at home and decrease her risk for falls.  OBJECTIVE IMPAIRMENTS: decreased balance and decreased strength.   ACTIVITY LIMITATIONS: standing, squatting, stairs, and transfers  PARTICIPATION LIMITATIONS: meal prep, cleaning, laundry, shopping, and community activity  PERSONAL FACTORS: Age, Past/current experiences, Time since onset of injury/illness/exacerbation, and 3+ comorbidities: memory change, fatigue, and NPH  are also affecting patient's functional outcome.   REHAB POTENTIAL: Fair    CLINICAL DECISION MAKING: Unstable/unpredictable  EVALUATION COMPLEXITY: High   GOALS: Goals reviewed with patient? Yes  SHORT TERM GOALS: Target date: 02/12/2024   Pt will be independent with HEP in order to improve strength and balance in order to decrease fall risk and improve function at home. Baseline: HEP provided  Goal status: IN progress   LONG TERM GOALS: Target date: 03/11/2024   1.  Pt will improve BERG by at least 3 points in order to demonstrate clinically significant improvement in balance.   Baseline: 19/56; 01/15/24: 19/56; Goal status:  ONGOING  2.  Pt will improve ABC by at least 13% in order to demonstrate clinically significant improvement in balance confidence.      Baseline: 40%, 01/15/24: 65% Goal status: ACHIEVED  3. Pt will decrease  5TSTS by at least 3 seconds in order to demonstrate clinically significant improvement in LE strength      Baseline: 19.7s, 01/15/24: 15.9s; Goal status: ACHIEVED  4. Pt will decrease TUG to below 14 seconds/decrease in order to demonstrate decreased fall risk.  Baseline: 19.3s, 01/15/24: 20.7s; Goal status: ONGOING   PLAN: PT FREQUENCY: 2x/week  PT DURATION: 8 weeks  PLANNED INTERVENTIONS: Therapeutic exercises, Therapeutic activity, Neuromuscular re-education, Balance training, Gait training, Patient/Family education, Self Care, Joint mobilization, Joint manipulation, Vestibular training, Canalith repositioning, Orthotic/Fit training, DME instructions, Dry Needling, Electrical stimulation, Spinal manipulation, Spinal mobilization, Cryotherapy, Moist heat, Taping, Traction, Ultrasound, Ionotophoresis 4mg /ml Dexamethasone, Manual therapy, and Re-evaluation.  PLAN FOR NEXT SESSION: added seated ankle weight exercises, oculomotor/vestibular testing, progress balance and strength exercises, review/modify HEP as needed;   Shoni Quijas D Hollye Pritt PT, DPT, GCS  Cyndee Giammarco 02/06/2024, 9:30 PM

## 2024-02-07 ENCOUNTER — Ambulatory Visit

## 2024-02-07 DIAGNOSIS — R262 Difficulty in walking, not elsewhere classified: Secondary | ICD-10-CM | POA: Diagnosis not present

## 2024-02-07 DIAGNOSIS — M6281 Muscle weakness (generalized): Secondary | ICD-10-CM

## 2024-02-07 NOTE — Therapy (Signed)
 OUTPATIENT PHYSICAL THERAPY BALANCE TREATMENT   Patient Name: Crystal Lynch: [id number] DOB:11/13/1940, 83 y.o., female Today's Date: 02/07/2024  END OF SESSION:  PT End of Session - 02/07/24 0907     Visit Number 16    Number of Visits 33    Date for PT Re-Evaluation 03/11/24    Authorization Type eval: 11/19/23    PT Start Time 0848    PT Stop Time 0930    PT Time Calculation (min) 42 min    Equipment Utilized During Treatment Gait belt    Activity Tolerance Patient tolerated treatment well    Behavior During Therapy WFL for tasks assessed/performed            Past Medical History:  Diagnosis Date   Arthritis    Chicken pox    Endometriosis    Esophagitis    s/p esophageal dilatation   GERD (gastroesophageal reflux disease)    History of ovarian cyst    Hypercholesterolemia    Hypertension    Hypothyroidism    Past Surgical History:  Procedure Laterality Date   ABDOMINAL SURGERY     found to have an ovarian cyst and endometriosis   APPENDECTOMY     Patient Active Problem List   Diagnosis Date Noted   Low back pain 12/10/2023   Postnasal drip 12/10/2023   Bursitis of right hip 09/12/2023   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   Stress 07/27/2022   Unsteady gait 07/10/2022   Hypophosphatemia 06/11/2022   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   OSA (obstructive sleep apnea) 06/09/2022   Kidney lesion 12/14/2021   Left hip pain 12/13/2021   Memory change 11/27/2021   Fatigue 07/23/2021   Aortic atherosclerosis (HCC) 04/17/2021   Right acute serous otitis media    Hyponatremia 02/23/2021   Prolonged QT interval 02/23/2021   Dizziness 09/12/2020   Fall 03/08/2020   Fatty liver 12/28/2019   Chest pain 07/25/2019   Frequent urinary tract infections 12/24/2018   UTI (urinary tract infection) 10/11/2018   Hypercalcemia 01/09/2017   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   Sleeping difficulties 03/08/2015   Health care maintenance 01/10/2015    Osteopenia 08/10/2014   Hyperbilirubinemia 10/27/2012   Hematuria 10/27/2012   Hypertension 10/21/2012   Hypercholesterolemia 10/21/2012   Hypothyroidism 10/21/2012   Esophagitis 10/21/2012    PCP: Dellar Fenton, MD  REFERRING PROVIDER: Dellar Fenton, MD   REFERRING DIAG: R26.81 (ICD-10-CM) - Unsteady gait   RATIONALE FOR EVALUATION AND TREATMENT: Rehabilitation  THERAPY DIAG: Difficulty in walking, not elsewhere classified  Muscle weakness (generalized)  ONSET DATE: Chronic x years  FOLLOW-UP APPT SCHEDULED WITH REFERRING PROVIDER: Yes   FROM INITIAL EVALUATION SUBJECTIVE:  SUBJECTIVE STATEMENT:  Unsteadiness  PERTINENT HISTORY:  Pt referred to PT for unsteadiness and dizziness. History today obtained from both patient and husband. "I'm dizzy all the time." She denies vertigo but reports persistent unsteadiness with occasional lightheadedness. Pt has a history of NPH. She underwent a shunt trial at [facility] but there was not enough change in her testing to merit a permanent shunt placement. She is currently being followed by [facility] neurology and has a follow-up appointment this Wednesday. 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 She had a alpha-synuclein skin biopsy, EMG/NCS, echo, and CTA head and neck. 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 Recommended baby aspirin and CTA 6 months. 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 No evidence of motor neuron disease.  Pt reports shuffling gait and freezing episodes but no tremor. She was started on carbidopa-levodopa but has not noticed any change in her symptoms since starting. She uses a rollator at home and front wheeled walker when out in the community. Pt wears eyeglasses all the time and has an upcoming  check-up with optometry. History of occasional sinus headaches but no history of migraines. Pt reports some L hip weakness.  Pain: Yes, L hip pain Numbness/Tingling: No Focal Weakness: Yes, L hip weakness related to pain; Recent changes in overall health/medication: Yes, carvidopa/levodopa Prior history of physical therapy for balance:  Yes Dominant hand: right Red flags: no personal history of cancer, abdominal pain, chills/fever, night sweats, nausea, vomiting, unrelenting pain  PRECAUTIONS: None  WEIGHT BEARING RESTRICTIONS: No  FALLS: Has patient fallen in last 6 months? Yes. Number of falls 7-8 , Directional pattern for falls: Yes Backwards  , often occurs when turning.   Living Environment Lives with: lives with their spouse Lives in: House/apartment Stairs: 2 to enter, railing on left during ascend Has following equipment at home: [facility] over commode, walk-in shower with seat and grab bars. Wheelchair for extended distances.   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   Hobbies: Enjoys crafting, reading.   Patient Goals: Build confidence in her balance   OBJECTIVE:   Patient Surveys  ABC: 40%  Cognition Patient is oriented to person, place, and time.  Recent memory is grossly intact however some memory deficit is evident. Remote memory is intact.  Attention span and concentration are intact.  Expressive speech is intact.  Patient's fund of knowledge is within normal limits for educational level.    Gross Musculoskeletal Assessment Tremor: None Bulk: Normal Tone: Normal  Posture: No gross abnormalities noted in standing or seated posture with the exception of some mild forward head.  AROM Deferred  LE MMT: MMT (out of 5) Right  Left   Hip flexion 5 4+  Hip extension    Hip abduction (seated) 4+ 4  Hip adduction 4+ 4  Hip internal rotation    Hip external rotation     Knee flexion (seated) 5 5  Knee extension 5 5  Ankle dorsiflexion 5 5  (* = pain; Blank rows = not tested)  Sensation Deferred  Reflexes Deferred  Cranial Nerves Deferred  Coordination/Cerebellar Finger to Nose: Mild dysmetria LUE Heel to Shin: Deferred Rapid alternating movements: Abnormal LUE Finger Opposition: WNL Pronator Drift: Negative  Bed mobility: Deferred  Transfers: Assistive device utilized: Environmental consultant - 2 wheeled  Sit to stand: Modified independence Stand to sit: Modified independence Chair to chair: Modified independence Floor: Deferred  Curb:  Deferred  Stairs: Level of Assistance: Modified independence Stair Negotiation Technique: Step to Pattern with Bilateral Rails Number of Stairs: 4  Height of Stairs: 6"  Comments: Pt moves slowly and leans backwards during descent  Gait: Gait pattern: step through pattern, decreased step length- Right, decreased step length- Left, and shuffling Distance walked: 100' Assistive device utilized: Environmental consultant - 2 wheeled Level of assistance: Modified independence Comments: ataxic gait with shuffling steps;  mCTSIB: unable to complete, pt only able to stand for approximately 2-3s in condition 1;  Orthostatic vitals (11/26/23): Supine: BP: 151/66 mmHg, HR: 66 bpm, SpO2: 97% Seated: BP: 151/58 mmHg, HR: 68 bpm, SpO2: 97% Standing (1 minute): BP: 144/60 mmHg, HR: 71 bpm, SpO2: 98% Standing (3 minutes): BP: 149/63 mmHg, HR: 69 bpm, SpO2: 99%   BPPV TESTS (11/26/23):  Symptoms Duration Intensity Nystagmus  L Dix-Hallpike Dizziness/lightheadedness   None  R Dix-Hallpike Dizziness/lightheadedness   None  L Head Roll None   None  R Head Roll None   None  L Sidelying Test      R Sidelying Test      (blank = not tested)  Functional Outcome Measures  Results 11/26/23 Comments  BERG 19/56  High fall risk  DGI  8/24   FGA     TUG 19.3 seconds  Increased fall risk  5TSTS 19.7 seconds  Increased fall risk  6 Minute Walk Test      10 Meter Gait Speed Self-selected: 15.5s = 0.65 m/s; Fastest: 12.5s = 0.8 m/s  Below functional community ambulation speeds  (Blank rows = not tested)   TODAY'S TREATMENT    SUBJECTIVE: Pt reports that she is doing alright today. No falls since the last therapy session and no health changes. She reports slight dizziness this morning (denies vertigo). No specific pain reported upon arrival today.   PAIN: Denies   Neuromuscular Re-education  NuStep L0-4 (seat 7, arms 9) x 10 minutes at start of session for warm-up, BLE strengthening, and interval history. Therapist adjusting resistance to modify challenge; Vitals:  Seated: BP: 137/50 mmHg, HR: 63 bpm, SpO2: 97%  Seated large amplitude reaching down, up, and out x multiple bouts; Seated trunk rotation with lateral large amplitude reaching x multiple bouts; Standing large amplitude forward lunges with chest opening reaching x multiple bouts; Alternating 6" step taps without UE support x 10 BLE, repeated cues for anterior weight shifting; Forward and side stepping in agility ladder with verbal and visual cues to increase step length x multiple bouts each; Walking in rehab gym, hallway, and outside across grass/mulch.  Repeated verbal cues to decrease cadence and increase step length, practiced multiple slow turns and changes between surfaces/through thresholds.    Not performed: Knee flex with GTB 2 x 10 reps  Seated clams with manual resistance from therapist x 10 BLE Seated adductor squeezes with manual resistance from therapist x 10 BLE; Feet apart heel/toe raises without UE support x 15; Staggered balance on 6" step eyes open/closed x 30s each BLE; Staggered stance balance eyes open/closed x 30s each BLE; Staggered stance balance horizontal/vertical head turns x 30s each BLE; Resisted gait with double black tband and faded UE support forward and backward x 4 each direction; 12" forward step ups alternating leading LE x 5  BLE; Standing heel raise and hold 3s x 10 without UE support; Alternating 6" step-ups with BUE support alternating leading LE with faded UE support x 10 BLE; Stand exercises with 5# ankle weights (AW): Hip flexion marching down parallel bars  x 4 lengths; Hamstring curls x 10 BLE; Hip abduction x 10 BLE; Hip extension x 10 BLE; Seated LAQ with 5# AW 2 x 10 BLE; Sit to stand without UE support 2 x 10, cues for forward trunk lean; Side stepping with 5# AW, BUE support, x multiple laps;   PATIENT EDUCATION:  Education details: Pt educated throughout session about proper posture and technique with exercises. Improved exercise technique, movement at target joints, use of target muscles after min to mod verbal, visual, tactile cues. Balance exercises Person educated: Patient Education method: Explanation and Verbal cues Education comprehension: verbalized understanding, returned demonstration, and verbal cues required   HOME EXERCISE PROGRAM:  Access Code: [email or web address] URL: [email or web address] Date: 01/10/2024 Prepared by: Janet Berlin  Exercises - Supine Lower Trunk Rotation  - 3 x daily - 7 x weekly - 3 sets - 10 reps - Seated Hamstring Stretch  - 3 x daily - 7 x weekly - 1 sets - 10 reps - 10secs hold - Supine Double Knee to Chest  - 3 x daily - 7 x weekly - 1 sets - 10 reps - 10sec hold - Supine Double Knee to Chest  - 1 x daily - 7 x weekly - 1 sets - 10 reps - 10secs hold - Standing 3-Way Leg Reach with Resistance at Ankles and Counter Support  - 1 x daily - 7 x weekly - 3 sets - 10 reps - Seated Knee Extension with Resistance  - 1 x daily - 7 x weekly - 3 sets - 10 reps - Standing Hamstring Curl with Resistance  - 1 x daily - 7 x weekly - 3 sets - 10 reps   ASSESSMENT:  CLINICAL IMPRESSION: Performed additional balance exercises during session today with continued focus on longer step lengths during gait as well as practice with turning. Introduced additional surfaces  outside such as concrete, grass, mulch, and ramps.  Pt demonstrates improved stability with turning again today. She also demonstrates improved ability to identify posterior LOB and self-correct without external assistance. Plan to progress balance and strength exercises at future sessions while incorporating cognitive tasks. Pt encouraged to follow-up as scheduled. Pt will benefit from PT services to address deficits in strength, balance, and mobility in order to return to full function at home and decrease her risk for falls.  OBJECTIVE IMPAIRMENTS: decreased balance and decreased strength.   ACTIVITY LIMITATIONS: standing, squatting, stairs, and transfers  PARTICIPATION LIMITATIONS: meal prep, cleaning, laundry, shopping, and community activity  PERSONAL FACTORS: Age, Past/current experiences, Time since onset of injury/illness/exacerbation, and 3+ comorbidities: memory change, fatigue, and NPH  are also affecting patient's functional outcome.   REHAB POTENTIAL: Fair    CLINICAL DECISION MAKING: Unstable/unpredictable  EVALUATION COMPLEXITY: High   GOALS: Goals reviewed with patient? Yes  SHORT TERM GOALS: Target date: 02/12/2024   Pt will be independent with HEP in order to improve strength and balance in order to decrease fall risk and improve function at home. Baseline: HEP provided  Goal status: IN progress   LONG TERM GOALS: Target date: 03/11/2024   1.  Pt will improve BERG by at least 3 points in order to demonstrate clinically significant improvement in balance.   Baseline: 19/56; 01/15/24: 19/56; Goal status: ONGOING  2.  Pt will improve ABC by at least 13% in order to demonstrate clinically significant improvement in balance confidence.      Baseline: 40%, 01/15/24: 65% Goal status: ACHIEVED  3. Pt will decrease 5TSTS by at least 3  seconds in order to demonstrate clinically significant improvement in LE strength      Baseline: 19.7s, 01/15/24: 15.9s; Goal status:  ACHIEVED  4. Pt will decrease TUG to below 14 seconds/decrease in order to demonstrate decreased fall risk.  Baseline: 19.3s, 01/15/24: 20.7s; Goal status: ONGOING   PLAN: PT FREQUENCY: 2x/week  PT DURATION: 8 weeks  PLANNED INTERVENTIONS: Therapeutic exercises, Therapeutic activity, Neuromuscular re-education, Balance training, Gait training, Patient/Family education, Self Care, Joint mobilization, Joint manipulation, Vestibular training, Canalith repositioning, Orthotic/Fit training, DME instructions, Dry Needling, Electrical stimulation, Spinal manipulation, Spinal mobilization, Cryotherapy, Moist heat, Taping, Traction, Ultrasound, Ionotophoresis 4mg /ml Dexamethasone, Manual therapy, and Re-evaluation.  PLAN FOR NEXT SESSION: added seated ankle weight exercises, oculomotor/vestibular testing, progress balance and strength exercises, review/modify HEP as needed;   Kenyen Candy D Daimon Kean PT, DPT, GCS  Kimi Bordeau 02/07/2024, 10:25 AM

## 2024-02-09 NOTE — Therapy (Signed)
 OUTPATIENT PHYSICAL THERAPY BALANCE TREATMENT   Patient Name: Crystal Lynch MRN: [id number] DOB:1941/05/22, 83 y.o., female Today's Date: 02/12/2024  END OF SESSION:  PT End of Session - 02/12/24 0849     Visit Number 17    Number of Visits 33    Date for PT Re-Evaluation 03/11/24    Authorization Type eval: 11/19/23    PT Start Time 0845    PT Stop Time 0930    PT Time Calculation (min) 45 min    Equipment Utilized During Treatment Gait belt    Activity Tolerance Patient tolerated treatment well    Behavior During Therapy WFL for tasks assessed/performed            Past Medical History:  Diagnosis Date   Arthritis    Chicken pox    Endometriosis    Esophagitis    s/p esophageal dilatation   GERD (gastroesophageal reflux disease)    History of ovarian cyst    Hypercholesterolemia    Hypertension    Hypothyroidism    Past Surgical History:  Procedure Laterality Date   ABDOMINAL SURGERY     found to have an ovarian cyst and endometriosis   APPENDECTOMY     Patient Active Problem List   Diagnosis Date Noted   Low back pain 12/10/2023   Postnasal drip 12/10/2023   Bursitis of right hip 09/12/2023   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   Stress 07/27/2022   Unsteady gait 07/10/2022   Hypophosphatemia 06/11/2022   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   OSA (obstructive sleep apnea) 06/09/2022   Kidney lesion 12/14/2021   Left hip pain 12/13/2021   Memory change 11/27/2021   Fatigue 07/23/2021   Aortic atherosclerosis (HCC) 04/17/2021   Right acute serous otitis media    Hyponatremia 02/23/2021   Prolonged QT interval 02/23/2021   Dizziness 09/12/2020   Fall 03/08/2020   Fatty liver 12/28/2019   Chest pain 07/25/2019   Frequent urinary tract infections 12/24/2018   UTI (urinary tract infection) 10/11/2018   Hypercalcemia 01/09/2017   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   Sleeping difficulties 03/08/2015   Health care maintenance 01/10/2015    Osteopenia 08/10/2014   Hyperbilirubinemia 10/27/2012   Hematuria 10/27/2012   Hypertension 10/21/2012   Hypercholesterolemia 10/21/2012   Hypothyroidism 10/21/2012   Esophagitis 10/21/2012    PCP: Dellar Fenton, MD  REFERRING PROVIDER: Dellar Fenton, MD   REFERRING DIAG: R26.81 (ICD-10-CM) - Unsteady gait   RATIONALE FOR EVALUATION AND TREATMENT: Rehabilitation  THERAPY DIAG: Difficulty in walking, not elsewhere classified  Muscle weakness (generalized)  ONSET DATE: Chronic x years  FOLLOW-UP APPT SCHEDULED WITH REFERRING PROVIDER: Yes   FROM INITIAL EVALUATION SUBJECTIVE:  SUBJECTIVE STATEMENT:  Unsteadiness  PERTINENT HISTORY:  Pt referred to PT for unsteadiness and dizziness. History today obtained from both patient and husband. "I'm dizzy all the time." She denies vertigo but reports persistent unsteadiness with occasional lightheadedness. Pt has a history of NPH. She underwent a shunt trial at [facility] but there was not enough change in her testing to merit a permanent shunt placement. She is currently being followed by [facility] neurology and has a follow-up appointment this Wednesday. 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 She had a alpha-synuclein skin biopsy, EMG/NCS, echo, and CTA head and neck. 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 Recommended baby aspirin  and CTA 6 months. 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 No evidence of motor neuron disease.  Pt reports shuffling gait and freezing episodes but no tremor. She was started on carbidopa-levodopa but has not noticed any change in her symptoms since starting. She uses a rollator at home and front wheeled walker when out in the community. Pt wears eyeglasses all the time and has an upcoming  check-up with optometry. History of occasional sinus headaches but no history of migraines. Pt reports some L hip weakness.  Pain: Yes, L hip pain Numbness/Tingling: No Focal Weakness: Yes, L hip weakness related to pain; Recent changes in overall health/medication: Yes, carvidopa/levodopa Prior history of physical therapy for balance:  Yes Dominant hand: right Red flags: no personal history of cancer, abdominal pain, chills/fever, night sweats, nausea, vomiting, unrelenting pain  PRECAUTIONS: None  WEIGHT BEARING RESTRICTIONS: No  FALLS: Has patient fallen in last 6 months? Yes. Number of falls 7-8 , Directional pattern for falls: Yes Backwards  , often occurs when turning.   Living Environment Lives with: lives with their spouse Lives in: House/apartment Stairs: 2 to enter, railing on left during ascend Has following equipment at home: [facility] over commode, walk-in shower with seat and grab bars. Wheelchair for extended distances.   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   Hobbies: Enjoys crafting, reading.   Patient Goals: Build confidence in her balance   OBJECTIVE:   Patient Surveys  ABC: 40%  Cognition Patient is oriented to person, place, and time.  Recent memory is grossly intact however some memory deficit is evident. Remote memory is intact.  Attention span and concentration are intact.  Expressive speech is intact.  Patient's fund of knowledge is within normal limits for educational level.    Gross Musculoskeletal Assessment Tremor: None Bulk: Normal Tone: Normal  Posture: No gross abnormalities noted in standing or seated posture with the exception of some mild forward head.  AROM Deferred  LE MMT: MMT (out of 5) Right  Left   Hip flexion 5 4+  Hip extension    Hip abduction (seated) 4+ 4  Hip adduction 4+ 4  Hip internal rotation    Hip external rotation     Knee flexion (seated) 5 5  Knee extension 5 5  Ankle dorsiflexion 5 5  (* = pain; Blank rows = not tested)  Sensation Deferred  Reflexes Deferred  Cranial Nerves Deferred  Coordination/Cerebellar Finger to Nose: Mild dysmetria LUE Heel to Shin: Deferred Rapid alternating movements: Abnormal LUE Finger Opposition: WNL Pronator Drift: Negative  Bed mobility: Deferred  Transfers: Assistive device utilized: Environmental consultant - 2 wheeled  Sit to stand: Modified independence Stand to sit: Modified independence Chair to chair: Modified independence Floor: Deferred  Curb:  Deferred  Stairs: Level of Assistance: Modified independence Stair Negotiation Technique: Step to Pattern with Bilateral Rails Number of Stairs: 4  Height of Stairs: 6"  Comments: Pt moves slowly and leans backwards during descent  Gait: Gait pattern: step through pattern, decreased step length- Right, decreased step length- Left, and shuffling Distance walked: 100' Assistive device utilized: Environmental consultant - 2 wheeled Level of assistance: Modified independence Comments: ataxic gait with shuffling steps;  mCTSIB: unable to complete, pt only able to stand for approximately 2-3s in condition 1;  Orthostatic vitals (11/26/23): Supine: BP: 151/66 mmHg, HR: 66 bpm, SpO2: 97% Seated: BP: 151/58 mmHg, HR: 68 bpm, SpO2: 97% Standing (1 minute): BP: 144/60 mmHg, HR: 71 bpm, SpO2: 98% Standing (3 minutes): BP: 149/63 mmHg, HR: 69 bpm, SpO2: 99%   BPPV TESTS (11/26/23):  Symptoms Duration Intensity Nystagmus  L Dix-Hallpike Dizziness/lightheadedness   None  R Dix-Hallpike Dizziness/lightheadedness   None  L Head Roll None   None  R Head Roll None   None  L Sidelying Test      R Sidelying Test      (blank = not tested)  Functional Outcome Measures  Results 11/26/23 Comments  BERG 19/56  High fall risk  DGI  8/24   FGA     TUG 19.3 seconds  Increased fall risk  5TSTS 19.7 seconds  Increased fall risk  6 Minute Walk Test      10 Meter Gait Speed Self-selected: 15.5s = 0.65 m/s; Fastest: 12.5s = 0.8 m/s  Below functional community ambulation speeds  (Blank rows = not tested)   TODAY'S TREATMENT    SUBJECTIVE: Pt reports that she is doing alright today. No falls since the last therapy session and no health changes. She had some hamstring cramping overnight and is still sore today. . She reports slight dizziness this morning (denies vertigo). No specific pain reported upon arrival today.   PAIN: Denies   Neuromuscular Re-education  NuStep L0-4 (seat 7, arms 9) x 10 minutes at start of session for warm-up, BLE strengthening, and interval history. Therapist adjusting resistance to modify challenge; Seated large amplitude reaching down, up, and out x multiple bous; Seated trunk rotation with lateral large amplitude reaching x multiple bouts; Standing large amplitude forward lunges with chest opening reaching x multiple bouts;   Therapeutic Activity Extensive walking in rehab gym, hallway, and outside. Repeated verbal cues to decrease cadence and increase step length, practiced multiple slow turns requiring concentration to prevent LOB. Added horizontal and vertical head turns which are very challenging for patient and often result in pt stopping completely. Also added dual cognitive task (fruit and vegetable naming) during gait and notable increase in shuffling pattern with occasional stopping;  Sit to stand without UE support 2 x 10, cues for forward trunk lean; Standing hip flexion marches with 3# ankle weights x 10 BLE; Standing hip abduction with 3# ankle weights x 10 BLE; Seated LAQ with 3# ankle weights x 10 BLE;   Not performed: Knee flex with GTB 2 x 10 reps  Seated clams with manual resistance from therapist x 10 BLE Seated adductor squeezes with manual resistance from therapist x 10 BLE; Feet apart heel/toe raises without UE support x 15; Staggered balance on 6" step eyes open/closed x 30s each  BLE; Staggered stance balance eyes open/closed x 30s each BLE; Staggered stance balance horizontal/vertical head turns x 30s each BLE; Resisted gait with double black tband and faded UE support forward and backward x 4 each direction; 12"  forward step ups alternating leading LE x 5 BLE; Standing heel raise and hold 3s x 10 without UE support; Alternating 6" step-ups with BUE support alternating leading LE with faded UE support x 10 BLE; Side stepping with 5# AW, BUE support, x multiple laps; Alternating 6" step taps without UE support x 10 BLE, repeated cues for anterior weight shifting; Forward and side stepping in agility ladder with verbal and visual cues to increase step length x multiple bouts each;   PATIENT EDUCATION:  Education details: Pt educated throughout session about proper posture and technique with exercises. Improved exercise technique, movement at target joints, use of target muscles after min to mod verbal, visual, tactile cues. Balance exercises Person educated: Patient Education method: Explanation and Verbal cues Education comprehension: verbalized understanding, returned demonstration, and verbal cues required   HOME EXERCISE PROGRAM:  Access Code: [email or web address] URL: [email or web address] Date: 01/10/2024 Prepared by: Ashley Lawrence  Exercises - Supine Lower Trunk Rotation  - 3 x daily - 7 x weekly - 3 sets - 10 reps - Seated Hamstring Stretch  - 3 x daily - 7 x weekly - 1 sets - 10 reps - 10secs hold - Supine Double Knee to Chest  - 3 x daily - 7 x weekly - 1 sets - 10 reps - 10sec hold - Supine Double Knee to Chest  - 1 x daily - 7 x weekly - 1 sets - 10 reps - 10secs hold - Standing 3-Way Leg Reach with Resistance at Ankles and Counter Support  - 1 x daily - 7 x weekly - 3 sets - 10 reps - Seated Knee Extension with Resistance  - 1 x daily - 7 x weekly - 3 sets - 10 reps - Standing Hamstring Curl with Resistance  - 1 x daily - 7 x weekly - 3 sets - 10  reps   ASSESSMENT:  CLINICAL IMPRESSION: Progressed dynamic gait/balance training with continued focus on longer step lengths during gait as well as practice with turning. Repeated ambulation outside as well. She appears to struggle more today with shuffling especially when adding head turns and dual tasking to gait. Plan to progress balance and strength exercises at future sessions while incorporating cognitive tasks. Pt encouraged to follow-up as scheduled. Pt will benefit from PT services to address deficits in strength, balance, and mobility in order to return to full function at home and decrease her risk for falls.  OBJECTIVE IMPAIRMENTS: decreased balance and decreased strength.   ACTIVITY LIMITATIONS: standing, squatting, stairs, and transfers  PARTICIPATION LIMITATIONS: meal prep, cleaning, laundry, shopping, and community activity  PERSONAL FACTORS: Age, Past/current experiences, Time since onset of injury/illness/exacerbation, and 3+ comorbidities: memory change, fatigue, and NPH  are also affecting patient's functional outcome.   REHAB POTENTIAL: Fair    CLINICAL DECISION MAKING: Unstable/unpredictable  EVALUATION COMPLEXITY: High   GOALS: Goals reviewed with patient? Yes  SHORT TERM GOALS: Target date: 02/12/2024   Pt will be independent with HEP in order to improve strength and balance in order to decrease fall risk and improve function at home. Baseline: HEP provided  Goal status: IN progress   LONG TERM GOALS: Target date: 03/11/2024   1.  Pt will improve BERG by at least 3 points in order to demonstrate clinically significant improvement in balance.   Baseline: 19/56; 01/15/24: 19/56; Goal status: ONGOING  2.  Pt will improve ABC by at least 13% in order to demonstrate clinically significant improvement in balance confidence.  Baseline: 40%, 01/15/24: 65% Goal status: ACHIEVED  3. Pt will decrease 5TSTS by at least 3 seconds in order to demonstrate  clinically significant improvement in LE strength      Baseline: 19.7s, 01/15/24: 15.9s; Goal status: ACHIEVED  4. Pt will decrease TUG to below 14 seconds/decrease in order to demonstrate decreased fall risk.  Baseline: 19.3s, 01/15/24: 20.7s; Goal status: ONGOING   PLAN: PT FREQUENCY: 2x/week  PT DURATION: 8 weeks  PLANNED INTERVENTIONS: Therapeutic exercises, Therapeutic activity, Neuromuscular re-education, Balance training, Gait training, Patient/Family education, Self Care, Joint mobilization, Joint manipulation, Vestibular training, Canalith repositioning, Orthotic/Fit training, DME instructions, Dry Needling, Electrical stimulation, Spinal manipulation, Spinal mobilization, Cryotherapy, Moist heat, Taping, Traction, Ultrasound, Ionotophoresis 4mg /ml Dexamethasone, Manual therapy, and Re-evaluation.  PLAN FOR NEXT SESSION: added seated ankle weight exercises, oculomotor/vestibular testing, progress balance and strength exercises, review/modify HEP as needed;   Lenix Kidd D Mcarthur Ivins PT, DPT, GCS  Kholton Coate 02/12/2024, 4:32 PM

## 2024-02-12 ENCOUNTER — Ambulatory Visit

## 2024-02-12 DIAGNOSIS — M6281 Muscle weakness (generalized): Secondary | ICD-10-CM

## 2024-02-12 DIAGNOSIS — R262 Difficulty in walking, not elsewhere classified: Secondary | ICD-10-CM | POA: Diagnosis not present

## 2024-02-14 ENCOUNTER — Ambulatory Visit

## 2024-02-14 DIAGNOSIS — R262 Difficulty in walking, not elsewhere classified: Secondary | ICD-10-CM | POA: Diagnosis not present

## 2024-02-14 DIAGNOSIS — M6281 Muscle weakness (generalized): Secondary | ICD-10-CM

## 2024-02-14 NOTE — Therapy (Unsigned)
 OUTPATIENT PHYSICAL THERAPY BALANCE TREATMENT   Patient Name: Crystal Lynch MRN: [id number] DOB:03/06/1941, 83 y.o., female Today's Date: 02/15/2024  END OF SESSION:  PT End of Session - 02/15/24 2005     Visit Number 18    Number of Visits 33    Date for PT Re-Evaluation 03/11/24    Authorization Type eval: 11/19/23    PT Start Time 0848    PT Stop Time 0930    PT Time Calculation (min) 42 min    Equipment Utilized During Treatment Gait belt    Activity Tolerance Patient tolerated treatment well    Behavior During Therapy WFL for tasks assessed/performed            Past Medical History:  Diagnosis Date   Arthritis    Chicken pox    Endometriosis    Esophagitis    s/p esophageal dilatation   GERD (gastroesophageal reflux disease)    History of ovarian cyst    Hypercholesterolemia    Hypertension    Hypothyroidism    Past Surgical History:  Procedure Laterality Date   ABDOMINAL SURGERY     found to have an ovarian cyst and endometriosis   APPENDECTOMY     Patient Active Problem List   Diagnosis Date Noted   Low back pain 12/10/2023   Postnasal drip 12/10/2023   Bursitis of right hip 09/12/2023   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   Stress 07/27/2022   Unsteady gait 07/10/2022   Hypophosphatemia 06/11/2022   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   OSA (obstructive sleep apnea) 06/09/2022   Kidney lesion 12/14/2021   Left hip pain 12/13/2021   Memory change 11/27/2021   Fatigue 07/23/2021   Aortic atherosclerosis (HCC) 04/17/2021   Right acute serous otitis media    Hyponatremia 02/23/2021   Prolonged QT interval 02/23/2021   Dizziness 09/12/2020   Fall 03/08/2020   Fatty liver 12/28/2019   Chest pain 07/25/2019   Frequent urinary tract infections 12/24/2018   UTI (urinary tract infection) 10/11/2018   Hypercalcemia 01/09/2017   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   Sleeping difficulties 03/08/2015   Health care maintenance 01/10/2015    Osteopenia 08/10/2014   Hyperbilirubinemia 10/27/2012   Hematuria 10/27/2012   Hypertension 10/21/2012   Hypercholesterolemia 10/21/2012   Hypothyroidism 10/21/2012   Esophagitis 10/21/2012    PCP: Dellar Fenton, MD  REFERRING PROVIDER: Dellar Fenton, MD   REFERRING DIAG: R26.81 (ICD-10-CM) - Unsteady gait   RATIONALE FOR EVALUATION AND TREATMENT: Rehabilitation  THERAPY DIAG: Difficulty in walking, not elsewhere classified  Muscle weakness (generalized)  ONSET DATE: Chronic x years  FOLLOW-UP APPT SCHEDULED WITH REFERRING PROVIDER: Yes   FROM INITIAL EVALUATION SUBJECTIVE:  SUBJECTIVE STATEMENT:  Unsteadiness  PERTINENT HISTORY:  Pt referred to PT for unsteadiness and dizziness. History today obtained from both patient and husband. "I'm dizzy all the time." She denies vertigo but reports persistent unsteadiness with occasional lightheadedness. Pt has a history of NPH. She underwent a shunt trial at [facility] but there was not enough change in her testing to merit a permanent shunt placement. She is currently being followed by [facility] neurology and has a follow-up appointment this Wednesday. 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 She had a alpha-synuclein skin biopsy, EMG/NCS, echo, and CTA head and neck. 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 Recommended baby aspirin  and CTA 6 months. 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 No evidence of motor neuron disease.  Pt reports shuffling gait and freezing episodes but no tremor. She was started on carbidopa-levodopa but has not noticed any change in her symptoms since starting. She uses a rollator at home and front wheeled walker when out in the community. Pt wears eyeglasses all the time and has an upcoming  check-up with optometry. History of occasional sinus headaches but no history of migraines. Pt reports some L hip weakness.  Pain: Yes, L hip pain Numbness/Tingling: No Focal Weakness: Yes, L hip weakness related to pain; Recent changes in overall health/medication: Yes, carvidopa/levodopa Prior history of physical therapy for balance:  Yes Dominant hand: right Red flags: no personal history of cancer, abdominal pain, chills/fever, night sweats, nausea, vomiting, unrelenting pain  PRECAUTIONS: None  WEIGHT BEARING RESTRICTIONS: No  FALLS: Has patient fallen in last 6 months? Yes. Number of falls 7-8 , Directional pattern for falls: Yes Backwards  , often occurs when turning.   Living Environment Lives with: lives with their spouse Lives in: House/apartment Stairs: 2 to enter, railing on left during ascend Has following equipment at home: [facility] over commode, walk-in shower with seat and grab bars. Wheelchair for extended distances.   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   Hobbies: Enjoys crafting, reading.   Patient Goals: Build confidence in her balance   OBJECTIVE:   Patient Surveys  ABC: 40%  Cognition Patient is oriented to person, place, and time.  Recent memory is grossly intact however some memory deficit is evident. Remote memory is intact.  Attention span and concentration are intact.  Expressive speech is intact.  Patient's fund of knowledge is within normal limits for educational level.    Gross Musculoskeletal Assessment Tremor: None Bulk: Normal Tone: Normal  Posture: No gross abnormalities noted in standing or seated posture with the exception of some mild forward head.  AROM Deferred  LE MMT: MMT (out of 5) Right  Left   Hip flexion 5 4+  Hip extension    Hip abduction (seated) 4+ 4  Hip adduction 4+ 4  Hip internal rotation    Hip external rotation     Knee flexion (seated) 5 5  Knee extension 5 5  Ankle dorsiflexion 5 5  (* = pain; Blank rows = not tested)  Sensation Deferred  Reflexes Deferred  Cranial Nerves Deferred  Coordination/Cerebellar Finger to Nose: Mild dysmetria LUE Heel to Shin: Deferred Rapid alternating movements: Abnormal LUE Finger Opposition: WNL Pronator Drift: Negative  Bed mobility: Deferred  Transfers: Assistive device utilized: Environmental consultant - 2 wheeled  Sit to stand: Modified independence Stand to sit: Modified independence Chair to chair: Modified independence Floor: Deferred  Curb:  Deferred  Stairs: Level of Assistance: Modified independence Stair Negotiation Technique: Step to Pattern with Bilateral Rails Number of Stairs: 4  Height of Stairs: 6"  Comments: Pt moves slowly and leans backwards during descent  Gait: Gait pattern: step through pattern, decreased step length- Right, decreased step length- Left, and shuffling Distance walked: 100' Assistive device utilized: Environmental consultant - 2 wheeled Level of assistance: Modified independence Comments: ataxic gait with shuffling steps;  mCTSIB: unable to complete, pt only able to stand for approximately 2-3s in condition 1;  Orthostatic vitals (11/26/23): Supine: BP: 151/66 mmHg, HR: 66 bpm, SpO2: 97% Seated: BP: 151/58 mmHg, HR: 68 bpm, SpO2: 97% Standing (1 minute): BP: 144/60 mmHg, HR: 71 bpm, SpO2: 98% Standing (3 minutes): BP: 149/63 mmHg, HR: 69 bpm, SpO2: 99%   BPPV TESTS (11/26/23):  Symptoms Duration Intensity Nystagmus  L Dix-Hallpike Dizziness/lightheadedness   None  R Dix-Hallpike Dizziness/lightheadedness   None  L Head Roll None   None  R Head Roll None   None  L Sidelying Test      R Sidelying Test      (blank = not tested)  Functional Outcome Measures  Results 11/26/23 Comments  BERG 19/56  High fall risk  DGI  8/24   FGA     TUG 19.3 seconds  Increased fall risk  5TSTS 19.7 seconds  Increased fall risk  6 Minute Walk Test      10 Meter Gait Speed Self-selected: 15.5s = 0.65 m/s; Fastest: 12.5s = 0.8 m/s  Below functional community ambulation speeds  (Blank rows = not tested)   TODAY'S TREATMENT    SUBJECTIVE: Pt reports that she is doing alright today but has been having dizziness this morning (described as wooziness). This is a chronic, intermittent issue for her. No falls since the last therapy session and no health changes. The hamstring cramping she had last session is better today. No specific pain reported upon arrival today.   PAIN: Denies   Neuromuscular Re-education  Feet apart eyes open/closed x 30s each; Feet apart horizontal and vertical head turns x 30s each; Feet together eyes open/closed x 30s each; Feet apart horizontal and vertical head turns x 30s each;   Therapeutic Activity Vitals:  Seated: BP: 140/61 mmHg, HR: 70 bpm, SpO2: 98%  NuStep L0-4 (seat 7, arms 9) x 10 minutes at start of session for warm-up, BLE strengthening, and interval history. Therapist adjusting resistance to modify challenge;  Extensive walking in rehab gym, hallway, and outside. Repeated verbal cues to decrease cadence and increase step length, practiced multiple slow turns requiring concentration to prevent LOB. Added horizontal and vertical head turns which are very challenging for patient and often result in pt stopping completely. Also worked on gait speed changes with patient during ambulation. While outside practiced descending curb without UE support; Sit to stand without UE support 2 x 10, cues for forward trunk lean;    Not performed: Knee flex with GTB 2 x 10 reps  Seated clams with manual resistance from therapist x 10 BLE Seated adductor squeezes with manual resistance from therapist x 10 BLE; Feet apart heel/toe raises without UE support x 15; Staggered balance on 6" step eyes open/closed x 30s each BLE; Staggered stance balance eyes open/closed x 30s each BLE; Staggered stance balance  horizontal/vertical head turns x 30s each BLE; Resisted gait with double black tband and faded UE support forward and backward x 4 each direction; 12" forward step ups alternating leading LE x 5 BLE; Standing heel  raise and hold 3s x 10 without UE support; Alternating 6" step-ups with BUE support alternating leading LE with faded UE support x 10 BLE; Side stepping with 5# AW, BUE support, x multiple laps; Alternating 6" step taps without UE support x 10 BLE, repeated cues for anterior weight shifting; Forward and side stepping in agility ladder with verbal and visual cues to increase step length x multiple Standing hip flexion marches with 3# ankle weights x 10 BLE; Standing hip abduction with 3# ankle weights x 10 BLE; Seated LAQ with 3# ankle weights x 10 BLE; bouts each; Seated large amplitude reaching down, up, and out x multiple bous; Seated trunk rotation with lateral large amplitude reaching x multiple bouts; Standing large amplitude forward lunges with chest opening reaching x multiple bouts;   PATIENT EDUCATION:  Education details: Pt educated throughout session about proper posture and technique with exercises. Improved exercise technique, movement at target joints, use of target muscles after min to mod verbal, visual, tactile cues. Balance exercises Person educated: Patient Education method: Explanation and Verbal cues Education comprehension: verbalized understanding, returned demonstration, and verbal cues required   HOME EXERCISE PROGRAM:  Access Code: [email or web address] URL: [email or web address] Date: 01/10/2024 Prepared by: Ashley Lawrence  Exercises - Supine Lower Trunk Rotation  - 3 x daily - 7 x weekly - 3 sets - 10 reps - Seated Hamstring Stretch  - 3 x daily - 7 x weekly - 1 sets - 10 reps - 10secs hold - Supine Double Knee to Chest  - 3 x daily - 7 x weekly - 1 sets - 10 reps - 10sec hold - Supine Double Knee to Chest  - 1 x daily - 7 x weekly - 1 sets - 10  reps - 10secs hold - Standing 3-Way Leg Reach with Resistance at Ankles and Counter Support  - 1 x daily - 7 x weekly - 3 sets - 10 reps - Seated Knee Extension with Resistance  - 1 x daily - 7 x weekly - 3 sets - 10 reps - Standing Hamstring Curl with Resistance  - 1 x daily - 7 x weekly - 3 sets - 10 reps   ASSESSMENT:  CLINICAL IMPRESSION: Progressed dynamic gait/balance training with continued focus on longer step lengths during gait as well as practice with turning. Practiced gait speed changes and repeated ambulation outside with curb training today. Plan to progress balance and strength exercises at future sessions while incorporating cognitive tasks. Pt encouraged to follow-up as scheduled. Pt will benefit from PT services to address deficits in strength, balance, and mobility in order to return to full function at home and decrease her risk for falls.  OBJECTIVE IMPAIRMENTS: decreased balance and decreased strength.   ACTIVITY LIMITATIONS: standing, squatting, stairs, and transfers  PARTICIPATION LIMITATIONS: meal prep, cleaning, laundry, shopping, and community activity  PERSONAL FACTORS: Age, Past/current experiences, Time since onset of injury/illness/exacerbation, and 3+ comorbidities: memory change, fatigue, and NPH  are also affecting patient's functional outcome.   REHAB POTENTIAL: Fair    CLINICAL DECISION MAKING: Unstable/unpredictable  EVALUATION COMPLEXITY: High   GOALS: Goals reviewed with patient? Yes  SHORT TERM GOALS: Target date: 02/12/2024   Pt will be independent with HEP in order to improve strength and balance in order to decrease fall risk and improve function at home. Baseline: HEP provided  Goal status: IN progress   LONG TERM GOALS: Target date: 03/11/2024   1.  Pt will improve BERG by at least 3 points in order  to demonstrate clinically significant improvement in balance.   Baseline: 19/56; 01/15/24: 19/56; Goal status: ONGOING  2.  Pt will  improve ABC by at least 13% in order to demonstrate clinically significant improvement in balance confidence.      Baseline: 40%, 01/15/24: 65% Goal status: ACHIEVED  3. Pt will decrease 5TSTS by at least 3 seconds in order to demonstrate clinically significant improvement in LE strength      Baseline: 19.7s, 01/15/24: 15.9s; Goal status: ACHIEVED  4. Pt will decrease TUG to below 14 seconds/decrease in order to demonstrate decreased fall risk.  Baseline: 19.3s, 01/15/24: 20.7s; Goal status: ONGOING   PLAN: PT FREQUENCY: 2x/week  PT DURATION: 8 weeks  PLANNED INTERVENTIONS: Therapeutic exercises, Therapeutic activity, Neuromuscular re-education, Balance training, Gait training, Patient/Family education, Self Care, Joint mobilization, Joint manipulation, Vestibular training, Canalith repositioning, Orthotic/Fit training, DME instructions, Dry Needling, Electrical stimulation, Spinal manipulation, Spinal mobilization, Cryotherapy, Moist heat, Taping, Traction, Ultrasound, Ionotophoresis 4mg /ml Dexamethasone, Manual therapy, and Re-evaluation.  PLAN FOR NEXT SESSION: added seated ankle weight exercises, oculomotor/vestibular testing, progress balance and strength exercises, review/modify HEP as needed;   Rogelio Waynick D Milanna Kozlov PT, DPT, GCS  Gabreil [place] 02/15/2024, 8:09 PM

## 2024-02-19 ENCOUNTER — Ambulatory Visit

## 2024-02-19 DIAGNOSIS — M6281 Muscle weakness (generalized): Secondary | ICD-10-CM

## 2024-02-19 DIAGNOSIS — R262 Difficulty in walking, not elsewhere classified: Secondary | ICD-10-CM

## 2024-02-19 NOTE — Therapy (Signed)
 OUTPATIENT PHYSICAL THERAPY BALANCE TREATMENT   Patient Name: Iymona Newfield MRN: [id number] DOB:[birth year], 83 y.o., female Today's Date: 02/19/2024  END OF SESSION:  PT End of Session - 02/19/24 0850     Visit Number 19    Number of Visits 33    Date for PT Re-Evaluation 03/11/24    Authorization Type eval: 11/19/23    PT Start Time 0845    PT Stop Time 0930    PT Time Calculation (min) 45 min    Equipment Utilized During Treatment Gait belt    Activity Tolerance Patient tolerated treatment well    Behavior During Therapy WFL for tasks assessed/performed            Past Medical History:  Diagnosis Date   Arthritis    Chicken pox    Endometriosis    Esophagitis    s/p esophageal dilatation   GERD (gastroesophageal reflux disease)    History of ovarian cyst    Hypercholesterolemia    Hypertension    Hypothyroidism    Past Surgical History:  Procedure Laterality Date   ABDOMINAL SURGERY     found to have an ovarian cyst and endometriosis   APPENDECTOMY     Patient Active Problem List   Diagnosis Date Noted   Low back pain 12/10/2023   Postnasal drip 12/10/2023   Bursitis of right hip 09/12/2023   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   Stress 07/27/2022   Unsteady gait 07/10/2022   Hypophosphatemia 06/11/2022   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   OSA (obstructive sleep apnea) 06/09/2022   Kidney lesion 12/14/2021   Left hip pain 12/13/2021   Memory change 11/27/2021   Fatigue 07/23/2021   Aortic atherosclerosis (HCC) 04/17/2021   Right acute serous otitis media    Hyponatremia 02/23/2021   Prolonged QT interval 02/23/2021   Dizziness 09/12/2020   Fall 03/08/2020   Fatty liver 12/28/2019   Chest pain 07/25/2019   Frequent urinary tract infections 12/24/2018   UTI (urinary tract infection) 10/11/2018   Hypercalcemia 01/09/2017   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   Sleeping difficulties 03/08/2015   Health care maintenance 01/10/2015    Osteopenia 08/10/2014   Hyperbilirubinemia 10/27/2012   Hematuria 10/27/2012   Hypertension 10/21/2012   Hypercholesterolemia 10/21/2012   Hypothyroidism 10/21/2012   Esophagitis 10/21/2012    PCP: Dellar Fenton, MD  REFERRING PROVIDER: Dellar Fenton, MD   REFERRING DIAG: R26.81 (ICD-10-CM) - Unsteady gait   RATIONALE FOR EVALUATION AND TREATMENT: Rehabilitation  THERAPY DIAG: Difficulty in walking, not elsewhere classified  Muscle weakness (generalized)  ONSET DATE: Chronic x years  FOLLOW-UP APPT SCHEDULED WITH REFERRING PROVIDER: Yes   FROM INITIAL EVALUATION SUBJECTIVE:  SUBJECTIVE STATEMENT:  Unsteadiness  PERTINENT HISTORY:  Pt referred to PT for unsteadiness and dizziness. History today obtained from both patient and husband. "I'm dizzy all the time." She denies vertigo but reports persistent unsteadiness with occasional lightheadedness. Pt has a history of NPH. She underwent a shunt trial at [facility] but there was not enough change in her testing to merit a permanent shunt placement. She is currently being followed by [facility] neurology and has a follow-up appointment this Wednesday. 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 She had a alpha-synuclein skin biopsy, EMG/NCS, echo, and CTA head and neck. 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 Recommended baby aspirin  and CTA 6 months. 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 No evidence of motor neuron disease.  Pt reports shuffling gait and freezing episodes but no tremor. She was started on carbidopa-levodopa but has not noticed any change in her symptoms since starting. She uses a rollator at home and front wheeled walker when out in the community. Pt wears eyeglasses all the time and has an upcoming  check-up with optometry. History of occasional sinus headaches but no history of migraines. Pt reports some L hip weakness.  Pain: Yes, L hip pain Numbness/Tingling: No Focal Weakness: Yes, L hip weakness related to pain; Recent changes in overall health/medication: Yes, carvidopa/levodopa Prior history of physical therapy for balance:  Yes Dominant hand: right Red flags: no personal history of cancer, abdominal pain, chills/fever, night sweats, nausea, vomiting, unrelenting pain  PRECAUTIONS: None  WEIGHT BEARING RESTRICTIONS: No  FALLS: Has patient fallen in last 6 months? Yes. Number of falls 7-8 , Directional pattern for falls: Yes Backwards  , often occurs when turning.   Living Environment Lives with: lives with their spouse Lives in: House/apartment Stairs: 2 to enter, railing on left during ascend Has following equipment at home: [facility] over commode, walk-in shower with seat and grab bars. Wheelchair for extended distances.   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   Hobbies: Enjoys crafting, reading.   Patient Goals: Build confidence in her balance   OBJECTIVE:   Patient Surveys  ABC: 40%  Cognition Patient is oriented to person, place, and time.  Recent memory is grossly intact however some memory deficit is evident. Remote memory is intact.  Attention span and concentration are intact.  Expressive speech is intact.  Patient's fund of knowledge is within normal limits for educational level.    Gross Musculoskeletal Assessment Tremor: None Bulk: Normal Tone: Normal  Posture: No gross abnormalities noted in standing or seated posture with the exception of some mild forward head.  AROM Deferred  LE MMT: MMT (out of 5) Right  Left   Hip flexion 5 4+  Hip extension    Hip abduction (seated) 4+ 4  Hip adduction 4+ 4  Hip internal rotation    Hip external rotation     Knee flexion (seated) 5 5  Knee extension 5 5  Ankle dorsiflexion 5 5  (* = pain; Blank rows = not tested)  Sensation Deferred  Reflexes Deferred  Cranial Nerves Deferred  Coordination/Cerebellar Finger to Nose: Mild dysmetria LUE Heel to Shin: Deferred Rapid alternating movements: Abnormal LUE Finger Opposition: WNL Pronator Drift: Negative  Bed mobility: Deferred  Transfers: Assistive device utilized: Environmental consultant - 2 wheeled  Sit to stand: Modified independence Stand to sit: Modified independence Chair to chair: Modified independence Floor: Deferred  Curb:  Deferred  Stairs: Level of Assistance: Modified independence Stair Negotiation Technique: Step to Pattern with Bilateral Rails Number of Stairs: 4  Height of Stairs: 6"  Comments: Pt moves slowly and leans backwards during descent  Gait: Gait pattern: step through pattern, decreased step length- Right, decreased step length- Left, and shuffling Distance walked: 100' Assistive device utilized: Environmental consultant - 2 wheeled Level of assistance: Modified independence Comments: ataxic gait with shuffling steps;  mCTSIB: unable to complete, pt only able to stand for approximately 2-3s in condition 1;  Orthostatic vitals (11/26/23): Supine: BP: 151/66 mmHg, HR: 66 bpm, SpO2: 97% Seated: BP: 151/58 mmHg, HR: 68 bpm, SpO2: 97% Standing (1 minute): BP: 144/60 mmHg, HR: 71 bpm, SpO2: 98% Standing (3 minutes): BP: 149/63 mmHg, HR: 69 bpm, SpO2: 99%   BPPV TESTS (11/26/23):  Symptoms Duration Intensity Nystagmus  L Dix-Hallpike Dizziness/lightheadedness   None  R Dix-Hallpike Dizziness/lightheadedness   None  L Head Roll None   None  R Head Roll None   None  L Sidelying Test      R Sidelying Test      (blank = not tested)  Functional Outcome Measures  Results 11/26/23 Comments  BERG 19/56  High fall risk  DGI  8/24   FGA     TUG 19.3 seconds  Increased fall risk  5TSTS 19.7 seconds  Increased fall risk  6 Minute Walk Test      10 Meter Gait Speed Self-selected: 15.5s = 0.65 m/s; Fastest: 12.5s = 0.8 m/s  Below functional community ambulation speeds  (Blank rows = not tested)   TODAY'S TREATMENT    SUBJECTIVE: Pt reports that she is doing alright today. No falls since the last therapy session and no health changes. No specific pain reported upon arrival today.   PAIN: Denies   Therapeutic Activity NuStep L0-4 (seat 7, arms 9) x 10 minutes at start of session for warm-up, BLE strengthening, and interval history. Therapist adjusting resistance to modify challenge; Airex alternating 6" step taps x 10 BLE; Forward 6" step-ups alternating LE x 10 on each side; Lateral 6" step-ups with BUE support x 10 toward each direction; Feet together eyes open/closed x 30s each; Feet apart horizontal and vertical head turns x 30s each; Extensive walking in rehab gym, hallway, and outside. Verbal cues to decrease cadence and increase step length, practiced multiple slow turns requiring concentration to prevent LOB. Performed horizontal and vertical head turns which are very challenging for patient and often result in pt stopping completely. Also worked on gait speed changes with patient during ambulation. Added cognitive task which also results in pt completely stopping during gait. While outside practiced gait across grass, mulch, and descending curb without UE support;   Not performed: Knee flex with GTB 2 x 10 reps  Seated clams with manual resistance from therapist x 10 BLE Seated adductor squeezes with manual resistance from therapist x 10 BLE; Feet apart heel/toe raises without UE support x 15; Staggered balance on 6" step eyes open/closed x 30s each BLE; Staggered stance balance eyes open/closed x 30s each BLE; Staggered stance balance horizontal/vertical head turns x 30s each BLE; Resisted gait with double black tband and faded UE support forward and backward x 4 each direction; Standing heel raise and hold 3s x  10 without UE support; Side stepping with 5# AW, BUE support, x multiple laps; Forward and side stepping in agility ladder with verbal and visual cues to increase step length x multiple Standing hip flexion marches with 3#  ankle weights x 10 BLE; Standing hip abduction with 3# ankle weights x 10 BLE; Seated LAQ with 3# ankle weights x 10 BLE; bouts each; Seated large amplitude reaching down, up, and out x multiple bous; Seated trunk rotation with lateral large amplitude reaching x multiple bouts; Standing large amplitude forward lunges with chest opening reaching x multiple bouts;   PATIENT EDUCATION:  Education details: Pt educated throughout session about proper posture and technique with exercises. Improved exercise technique, movement at target joints, use of target muscles after min to mod verbal, visual, tactile cues. Balance exercises Person educated: Patient Education method: Explanation and Verbal cues Education comprehension: verbalized understanding, returned demonstration, and verbal cues required   HOME EXERCISE PROGRAM:  Access Code: [email or web address] URL: [email or web address] Date: 01/10/2024 Prepared by: Ashley Lawrence  Exercises - Supine Lower Trunk Rotation  - 3 x daily - 7 x weekly - 3 sets - 10 reps - Seated Hamstring Stretch  - 3 x daily - 7 x weekly - 1 sets - 10 reps - 10secs hold - Supine Double Knee to Chest  - 3 x daily - 7 x weekly - 1 sets - 10 reps - 10sec hold - Supine Double Knee to Chest  - 1 x daily - 7 x weekly - 1 sets - 10 reps - 10secs hold - Standing 3-Way Leg Reach with Resistance at Ankles and Counter Support  - 1 x daily - 7 x weekly - 3 sets - 10 reps - Seated Knee Extension with Resistance  - 1 x daily - 7 x weekly - 3 sets - 10 reps - Standing Hamstring Curl with Resistance  - 1 x daily - 7 x weekly - 3 sets - 10 reps   ASSESSMENT:  CLINICAL IMPRESSION: Progressed dynamic gait/balance training with continued focus on longer step  lengths during gait as well as practice with turning. Practiced gait speed changes and repeated ambulation outside as well as dual motor/cognitive challenge. Plan to progress balance and strength exercises at future sessions. Pt encouraged to follow-up as scheduled. She will benefit from PT services to address deficits in strength, balance, and mobility in order to return to full function at home and decrease her risk for falls.  OBJECTIVE IMPAIRMENTS: decreased balance and decreased strength.   ACTIVITY LIMITATIONS: standing, squatting, stairs, and transfers  PARTICIPATION LIMITATIONS: meal prep, cleaning, laundry, shopping, and community activity  PERSONAL FACTORS: Age, Past/current experiences, Time since onset of injury/illness/exacerbation, and 3+ comorbidities: memory change, fatigue, and NPH  are also affecting patient's functional outcome.   REHAB POTENTIAL: Fair    CLINICAL DECISION MAKING: Unstable/unpredictable  EVALUATION COMPLEXITY: High   GOALS: Goals reviewed with patient? Yes  SHORT TERM GOALS: Target date: 02/12/2024   Pt will be independent with HEP in order to improve strength and balance in order to decrease fall risk and improve function at home. Baseline: HEP provided  Goal status: IN progress   LONG TERM GOALS: Target date: 03/11/2024   1.  Pt will improve BERG by at least 3 points in order to demonstrate clinically significant improvement in balance.   Baseline: 19/56; 01/15/24: 19/56; Goal status: ONGOING  2.  Pt will improve ABC by at least 13% in order to demonstrate clinically significant improvement in balance confidence.      Baseline: 40%, 01/15/24: 65% Goal status: ACHIEVED  3. Pt will decrease 5TSTS by at least 3 seconds in order to demonstrate clinically significant improvement in LE strength      Baseline: 19.7s,  01/15/24: 15.9s; Goal status: ACHIEVED  4. Pt will decrease TUG to below 14 seconds/decrease in order to demonstrate decreased fall  risk.  Baseline: 19.3s, 01/15/24: 20.7s; Goal status: ONGOING   PLAN: PT FREQUENCY: 2x/week  PT DURATION: 8 weeks  PLANNED INTERVENTIONS: Therapeutic exercises, Therapeutic activity, Neuromuscular re-education, Balance training, Gait training, Patient/Family education, Self Care, Joint mobilization, Joint manipulation, Vestibular training, Canalith repositioning, Orthotic/Fit training, DME instructions, Dry Needling, Electrical stimulation, Spinal manipulation, Spinal mobilization, Cryotherapy, Moist heat, Taping, Traction, Ultrasound, Ionotophoresis 4mg /ml Dexamethasone, Manual therapy, and Re-evaluation.  PLAN FOR NEXT SESSION: progress balance and strength exercises, review/modify HEP as needed;   Daysy Santini D Tawnee Clegg PT, DPT, GCS  Cydnie Deason 02/19/2024, 12:59 PM

## 2024-02-21 ENCOUNTER — Ambulatory Visit: Attending: Internal Medicine

## 2024-02-21 DIAGNOSIS — R262 Difficulty in walking, not elsewhere classified: Secondary | ICD-10-CM | POA: Insufficient documentation

## 2024-02-21 DIAGNOSIS — R2689 Other abnormalities of gait and mobility: Secondary | ICD-10-CM | POA: Insufficient documentation

## 2024-02-21 DIAGNOSIS — M6281 Muscle weakness (generalized): Secondary | ICD-10-CM | POA: Insufficient documentation

## 2024-02-21 DIAGNOSIS — R2681 Unsteadiness on feet: Secondary | ICD-10-CM | POA: Insufficient documentation

## 2024-02-21 NOTE — Therapy (Signed)
 OUTPATIENT PHYSICAL THERAPY BALANCE TREATMENT   Patient Name: Crystal Lynch MRN: [id number] DOB:1941/07/12, 83 y.o., female Today's Date: 02/21/2024  END OF SESSION:  PT End of Session - 02/21/24 1237     Visit Number 20    Number of Visits 33    Date for PT Re-Evaluation 03/11/24    Authorization Type eval: 11/19/23    PT Start Time 0845    PT Stop Time 0930    PT Time Calculation (min) 45 min    Equipment Utilized During Treatment Gait belt    Activity Tolerance Patient tolerated treatment well    Behavior During Therapy WFL for tasks assessed/performed             Past Medical History:  Diagnosis Date   Arthritis    Chicken pox    Endometriosis    Esophagitis    s/p esophageal dilatation   GERD (gastroesophageal reflux disease)    History of ovarian cyst    Hypercholesterolemia    Hypertension    Hypothyroidism    Past Surgical History:  Procedure Laterality Date   ABDOMINAL SURGERY     found to have an ovarian cyst and endometriosis   APPENDECTOMY     Patient Active Problem List   Diagnosis Date Noted   Low back pain 12/10/2023   Postnasal drip 12/10/2023   Bursitis of right hip 09/12/2023   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   Stress 07/27/2022   Unsteady gait 07/10/2022   Hypophosphatemia 06/11/2022   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   OSA (obstructive sleep apnea) 06/09/2022   Kidney lesion 12/14/2021   Left hip pain 12/13/2021   Memory change 11/27/2021   Fatigue 07/23/2021   Aortic atherosclerosis (HCC) 04/17/2021   Right acute serous otitis media    Hyponatremia 02/23/2021   Prolonged QT interval 02/23/2021   Dizziness 09/12/2020   Fall 03/08/2020   Fatty liver 12/28/2019   Chest pain 07/25/2019   Frequent urinary tract infections 12/24/2018   UTI (urinary tract infection) 10/11/2018   Hypercalcemia 01/09/2017   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   Sleeping difficulties 03/08/2015   Health care maintenance 01/10/2015    Osteopenia 08/10/2014   Hyperbilirubinemia 10/27/2012   Hematuria 10/27/2012   Hypertension 10/21/2012   Hypercholesterolemia 10/21/2012   Hypothyroidism 10/21/2012   Esophagitis 10/21/2012    PCP: Dellar Fenton, MD  REFERRING PROVIDER: Dellar Fenton, MD   REFERRING DIAG: R26.81 (ICD-10-CM) - Unsteady gait   RATIONALE FOR EVALUATION AND TREATMENT: Rehabilitation  THERAPY DIAG: Difficulty in walking, not elsewhere classified  Muscle weakness (generalized)  Unsteadiness on feet  Other abnormalities of gait and mobility  ONSET DATE: Chronic x years  FOLLOW-UP APPT SCHEDULED WITH REFERRING PROVIDER: Yes   FROM INITIAL EVALUATION SUBJECTIVE:  SUBJECTIVE STATEMENT:  Unsteadiness  PERTINENT HISTORY:  Pt referred to PT for unsteadiness and dizziness. History today obtained from both patient and husband. "I'm dizzy all the time." She denies vertigo but reports persistent unsteadiness with occasional lightheadedness. Pt has a history of NPH. She underwent a shunt trial at [facility] but there was not enough change in her testing to merit a permanent shunt placement. She is currently being followed by [facility] neurology and has a follow-up appointment this Wednesday. 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 She had a alpha-synuclein skin biopsy, EMG/NCS, echo, and CTA head and neck. 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 Recommended baby aspirin  and CTA 6 months. 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 No evidence of motor neuron disease.  Pt reports shuffling gait and freezing episodes but no tremor. She was started on carbidopa-levodopa but has not noticed any change in her symptoms since starting. She uses a rollator at home and front wheeled walker when out in the  community. Pt wears eyeglasses all the time and has an upcoming check-up with optometry. History of occasional sinus headaches but no history of migraines. Pt reports some L hip weakness.  Pain: Yes, L hip pain Numbness/Tingling: No Focal Weakness: Yes, L hip weakness related to pain; Recent changes in overall health/medication: Yes, carvidopa/levodopa Prior history of physical therapy for balance:  Yes Dominant hand: right Red flags: no personal history of cancer, abdominal pain, chills/fever, night sweats, nausea, vomiting, unrelenting pain  PRECAUTIONS: None  WEIGHT BEARING RESTRICTIONS: No  FALLS: Has patient fallen in last 6 months? Yes. Number of falls 7-8 , Directional pattern for falls: Yes Backwards  , often occurs when turning.   Living Environment Lives with: lives with their spouse Lives in: House/apartment Stairs: 2 to enter, railing on left during ascend Has following equipment at home: [facility] over commode, walk-in shower with seat and grab bars. Wheelchair for extended distances.   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   Hobbies: Enjoys crafting, reading.   Patient Goals: Build confidence in her balance   OBJECTIVE:   Patient Surveys  ABC: 40%  Cognition Patient is oriented to person, place, and time.  Recent memory is grossly intact however some memory deficit is evident. Remote memory is intact.  Attention span and concentration are intact.  Expressive speech is intact.  Patient's fund of knowledge is within normal limits for educational level.    Gross Musculoskeletal Assessment Tremor: None Bulk: Normal Tone: Normal  Posture: No gross abnormalities noted in standing or seated posture with the exception of some mild forward head.  AROM Deferred  LE MMT: MMT (out of 5) Right  Left   Hip flexion 5 4+  Hip extension    Hip abduction (seated) 4+ 4  Hip  adduction 4+ 4  Hip internal rotation    Hip external rotation    Knee flexion (seated) 5 5  Knee extension 5 5  Ankle dorsiflexion 5 5  (* = pain; Blank rows = not tested)  Sensation Deferred  Reflexes Deferred  Cranial Nerves Deferred  Coordination/Cerebellar Finger to Nose: Mild dysmetria LUE Heel to Shin: Deferred Rapid alternating movements: Abnormal LUE Finger Opposition: WNL Pronator Drift: Negative  Bed mobility: Deferred  Transfers: Assistive device utilized: Environmental consultant - 2 wheeled  Sit to stand: Modified independence Stand to sit: Modified independence Chair to chair: Modified independence Floor: Deferred  Curb:  Deferred  Stairs: Level of Assistance: Modified independence Stair Negotiation Technique: Step to Pattern with Bilateral Rails Number of Stairs: 4  Height of Stairs: 6"  Comments: Pt moves slowly and leans backwards during descent  Gait: Gait pattern: step through pattern, decreased step length- Right, decreased step length- Left, and shuffling Distance walked: 100' Assistive device utilized: Environmental consultant - 2 wheeled Level of assistance: Modified independence Comments: ataxic gait with shuffling steps;  mCTSIB: unable to complete, pt only able to stand for approximately 2-3s in condition 1;  Orthostatic vitals (11/26/23): Supine: BP: 151/66 mmHg, HR: 66 bpm, SpO2: 97% Seated: BP: 151/58 mmHg, HR: 68 bpm, SpO2: 97% Standing (1 minute): BP: 144/60 mmHg, HR: 71 bpm, SpO2: 98% Standing (3 minutes): BP: 149/63 mmHg, HR: 69 bpm, SpO2: 99%   BPPV TESTS (11/26/23):  Symptoms Duration Intensity Nystagmus  L Dix-Hallpike Dizziness/lightheadedness   None  R Dix-Hallpike Dizziness/lightheadedness   None  L Head Roll None   None  R Head Roll None   None  L Sidelying Test      R Sidelying Test      (blank = not tested)  Functional Outcome Measures  Results 11/26/23 Comments  BERG 19/56  High fall risk  DGI  8/24   FGA     TUG 19.3 seconds  Increased fall  risk  5TSTS 19.7 seconds  Increased fall risk  6 Minute Walk Test     10 Meter Gait Speed Self-selected: 15.5s = 0.65 m/s; Fastest: 12.5s = 0.8 m/s  Below functional community ambulation speeds  (Blank rows = not tested)   TODAY'S TREATMENT    SUBJECTIVE: Pt reports that she is doing alright today. No falls since the last therapy session and no health changes. No specific pain reported upon arrival today. " I want to continue therapy but I know I am not doing all the exercises as you have advised. Pt Spouse also reported of pt's non compliance.   PAIN: Denies   Therapeutic Activity NuStep L0-4 (seat 7, arms 9) x 10 minutes at start of session for warm-up, BLE strengthening, and interval history. Therapist adjusting resistance to modify challenge; Airex alternating 6" step taps x 10 BLE; Forward 6" step-ups alternating LE x 10 on each side; Lateral 6" step-ups with BUE support x 10 toward each direction; Feet together eyes open/closed x 30s each; Feet apart horizontal and vertical head turns x 30s each; Extensive walking in rehab gym, hallway, and outside. Verbal cues to decrease cadence and increase step length, practiced multiple slow turns requiring concentration to prevent LOB. Performed horizontal and vertical head turns which are very challenging for patient and often result in pt stopping completely. Also worked on gait speed changes with patient during ambulation. Added cognitive task which also results in pt completely stopping during gait. While outside practiced gait across grass, mulch, and descending curb without UE support;   Not performed: Knee flex with GTB 2 x 10 reps  Seated clams with manual resistance from therapist x 10 BLE Seated adductor squeezes with manual resistance from therapist x 10 BLE; Feet apart heel/toe raises without UE support x 15; Staggered balance on 6" step eyes open/closed x 30s each BLE; Staggered stance balance eyes open/closed x 30s each  BLE; Staggered stance balance horizontal/vertical head turns x 30s each BLE; Resisted gait with double black tband and faded UE support forward and backward x 4 each direction; Standing heel raise and hold 3s x 10 without UE support; Side stepping with 5# AW, BUE  support, x multiple laps; Forward and side stepping in agility ladder with verbal and visual cues to increase step length x multiple Standing hip flexion marches with 3# ankle weights x 10 BLE; Standing hip abduction with 3# ankle weights x 10 BLE; Seated LAQ with 3# ankle weights x 10 BLE; bouts each; Seated large amplitude reaching down, up, and out x multiple bous; Seated trunk rotation with lateral large amplitude reaching x multiple bouts; Standing large amplitude forward lunges with chest opening reaching x multiple bouts;   PATIENT EDUCATION:  Education details: Pt educated throughout session about proper posture and technique with exercises. Improved exercise technique, movement at target joints, use of target muscles after min to mod verbal, visual, tactile cues. Balance exercises Person educated: Patient Education method: Explanation and Verbal cues Education comprehension: verbalized understanding, returned demonstration, and verbal cues required   HOME EXERCISE PROGRAM:  Access Code: [email or web address] URL: [email or web address] Date: 01/10/2024 Prepared by: Ashley Lawrence  Exercises - Supine Lower Trunk Rotation  - 3 x daily - 7 x weekly - 3 sets - 10 reps - Seated Hamstring Stretch  - 3 x daily - 7 x weekly - 1 sets - 10 reps - 10secs hold - Supine Double Knee to Chest  - 3 x daily - 7 x weekly - 1 sets - 10 reps - 10sec hold - Supine Double Knee to Chest  - 1 x daily - 7 x weekly - 1 sets - 10 reps - 10secs hold - Standing 3-Way Leg Reach with Resistance at Ankles and Counter Support  - 1 x daily - 7 x weekly - 3 sets - 10 reps - Seated Knee Extension with Resistance  - 1 x daily - 7 x weekly - 3 sets - 10  reps - Standing Hamstring Curl with Resistance  - 1 x daily - 7 x weekly - 3 sets - 10 reps   ASSESSMENT:  CLINICAL IMPRESSION: PT re-assessed th ept and found TUG time has increased and Berg Balance remains the unchanged. Pt gait on incline needs min assist. Ambulation on even grounds demonstrates improvement with stride and smoothness. However, pt needs to use FWW for safety and pt remains at high fall risk. Pt and spouse education about the significance of HEP to promote rehab outcome and safety. PT discussed to extend the POC for 1 x week for 4 weeks to assess pt's compliance to HEP and decide further care based on pt compliance and  progress.   Pt encouraged to follow-up as scheduled. She will benefit from PT services to address deficits in strength, balance, and mobility in order to return to full function at home and decrease her risk for falls.  OBJECTIVE IMPAIRMENTS: decreased balance and decreased strength.   ACTIVITY LIMITATIONS: standing, squatting, stairs, and transfers  PARTICIPATION LIMITATIONS: meal prep, cleaning, laundry, shopping, and community activity  PERSONAL FACTORS: Age, Past/current experiences, Time since onset of injury/illness/exacerbation, and 3+ comorbidities: memory change, fatigue, and NPH  are also affecting patient's functional outcome.   REHAB POTENTIAL: Fair    CLINICAL DECISION MAKING: Unstable/unpredictable  EVALUATION COMPLEXITY: High   GOALS: Goals reviewed with patient? Yes  SHORT TERM GOALS: Target date: 02/12/2024   Pt will be independent with HEP in order to improve strength and balance in order to decrease fall risk and improve function at home. Baseline: HEP provided  Goal status: IN progress: Not met ( Spouse reported Non Compliance)   LONG TERM GOALS: Target date: 03/11/2024   1.  Pt  will improve BERG by at least 3 points in order to demonstrate clinically significant improvement in balance.   Baseline: 19/56; 01/15/24: 19/56; 20/56  on 5/1 Goal status: ONGOING  2.  Pt will improve ABC by at least 13% in order to demonstrate clinically significant improvement in balance confidence.      Baseline: 40%, 01/15/24: 65% Goal status: ACHIEVED  3. Pt will decrease 5TSTS by at least 3 seconds in order to demonstrate clinically significant improvement in LE strength      Baseline: 19.7s, 01/15/24: 15.9s;  Goal status: ACHIEVED  4. Pt will decrease TUG to below 14 seconds/decrease in order to demonstrate decreased fall risk.  Baseline: 19.3s, 01/15/24: 20.7s; 5/1 24.4secs   Goal status: ONGOING   PLAN: PT FREQUENCY: 2x/week  PT DURATION: 8 weeks  PLANNED INTERVENTIONS: Therapeutic exercises, Therapeutic activity, Neuromuscular re-education, Balance training, Gait training, Patient/Family education, Self Care, Joint mobilization, Joint manipulation, Vestibular training, Canalith repositioning, Orthotic/Fit training, DME instructions, Dry Needling, Electrical stimulation, Spinal manipulation, Spinal mobilization, Cryotherapy, Moist heat, Taping, Traction, Ultrasound, Ionotophoresis 4mg /ml Dexamethasone, Manual therapy, and Re-evaluation.  PLAN FOR NEXT SESSION: progress balance and strength exercises, review/modify HEP as needed;    Tiffany Talarico H Blima Jaimes 02/21/2024, 12:47 PM

## 2024-02-28 ENCOUNTER — Ambulatory Visit

## 2024-02-28 DIAGNOSIS — R262 Difficulty in walking, not elsewhere classified: Secondary | ICD-10-CM

## 2024-02-28 DIAGNOSIS — M6281 Muscle weakness (generalized): Secondary | ICD-10-CM

## 2024-02-28 NOTE — Therapy (Signed)
 OUTPATIENT PHYSICAL THERAPY BALANCE TREATMENT   Patient Name: Crystal Lynch MRN: [id number] DOB:09-Apr-1941, 83 y.o., female Today's Date: 02/29/2024  END OF SESSION:  PT End of Session - 02/28/24 0802     Visit Number 21    Number of Visits 33    Date for PT Re-Evaluation 03/11/24    Authorization Type eval: 11/19/23    PT Start Time 0800    PT Stop Time 0845    PT Time Calculation (min) 45 min    Equipment Utilized During Treatment Gait belt    Activity Tolerance Patient tolerated treatment well    Behavior During Therapy WFL for tasks assessed/performed            Past Medical History:  Diagnosis Date   Arthritis    Chicken pox    Endometriosis    Esophagitis    s/p esophageal dilatation   GERD (gastroesophageal reflux disease)    History of ovarian cyst    Hypercholesterolemia    Hypertension    Hypothyroidism    Past Surgical History:  Procedure Laterality Date   ABDOMINAL SURGERY     found to have an ovarian cyst and endometriosis   APPENDECTOMY     Patient Active Problem List   Diagnosis Date Noted   Low back pain 12/10/2023   Postnasal drip 12/10/2023   Bursitis of right hip 09/12/2023   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   Stress 07/27/2022   Unsteady gait 07/10/2022   Hypophosphatemia 06/11/2022   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   OSA (obstructive sleep apnea) 06/09/2022   Kidney lesion 12/14/2021   Left hip pain 12/13/2021   Memory change 11/27/2021   Fatigue 07/23/2021   Aortic atherosclerosis (HCC) 04/17/2021   Right acute serous otitis media    Hyponatremia 02/23/2021   Prolonged QT interval 02/23/2021   Dizziness 09/12/2020   Fall 03/08/2020   Fatty liver 12/28/2019   Chest pain 07/25/2019   Frequent urinary tract infections 12/24/2018   UTI (urinary tract infection) 10/11/2018   Hypercalcemia 01/09/2017   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   Sleeping difficulties 03/08/2015   Health care maintenance 01/10/2015    Osteopenia 08/10/2014   Hyperbilirubinemia 10/27/2012   Hematuria 10/27/2012   Hypertension 10/21/2012   Hypercholesterolemia 10/21/2012   Hypothyroidism 10/21/2012   Esophagitis 10/21/2012    PCP: Dellar Fenton, MD  REFERRING PROVIDER: Dellar Fenton, MD   REFERRING DIAG: R26.81 (ICD-10-CM) - Unsteady gait   RATIONALE FOR EVALUATION AND TREATMENT: Rehabilitation  THERAPY DIAG: Difficulty in walking, not elsewhere classified  Muscle weakness (generalized)  ONSET DATE: Chronic x years  FOLLOW-UP APPT SCHEDULED WITH REFERRING PROVIDER: Yes   FROM INITIAL EVALUATION SUBJECTIVE:  SUBJECTIVE STATEMENT:  Unsteadiness  PERTINENT HISTORY:  Pt referred to PT for unsteadiness and dizziness. History today obtained from both patient and husband. "I'm dizzy all the time." She denies vertigo but reports persistent unsteadiness with occasional lightheadedness. Pt has a history of NPH. She underwent a shunt trial at [facility] but there was not enough change in her testing to merit a permanent shunt placement. She is currently being followed by [facility] neurology and has a follow-up appointment this Wednesday. 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 She had a alpha-synuclein skin biopsy, EMG/NCS, echo, and CTA head and neck. 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 Recommended baby aspirin  and CTA 6 months. 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 No evidence of motor neuron disease.  Pt reports shuffling gait and freezing episodes but no tremor. She was started on carbidopa-levodopa but has not noticed any change in her symptoms since starting. She uses a rollator at home and front wheeled walker when out in the community. Pt wears eyeglasses all the time and has an upcoming  check-up with optometry. History of occasional sinus headaches but no history of migraines. Pt reports some L hip weakness.  Pain: Yes, L hip pain Numbness/Tingling: No Focal Weakness: Yes, L hip weakness related to pain; Recent changes in overall health/medication: Yes, carvidopa/levodopa Prior history of physical therapy for balance:  Yes Dominant hand: right Red flags: no personal history of cancer, abdominal pain, chills/fever, night sweats, nausea, vomiting, unrelenting pain  PRECAUTIONS: None  WEIGHT BEARING RESTRICTIONS: No  FALLS: Has patient fallen in last 6 months? Yes. Number of falls 7-8, Directional pattern for falls: Yes Backwards , often occurs when turning.   Living Environment Lives with: lives with their spouse Lives in: House/apartment Stairs: 2 to enter, railing on left during ascend Has following equipment at home: [facility] over commode, walk-in shower with seat and grab bars. Wheelchair for extended distances.   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   Hobbies: Enjoys crafting, reading.   Patient Goals: Build confidence in her balance   OBJECTIVE:   Patient Surveys  ABC: 40%  Cognition Patient is oriented to person, place, and time.  Recent memory is grossly intact however some memory deficit is evident. Remote memory is intact.  Attention span and concentration are intact.  Expressive speech is intact.  Patient's fund of knowledge is within normal limits for educational level.    Gross Musculoskeletal Assessment Tremor: None Bulk: Normal Tone: Normal  Posture: No gross abnormalities noted in standing or seated posture with the exception of some mild forward head.  AROM Deferred  LE MMT: MMT (out of 5) Right  Left   Hip flexion 5 4+  Hip extension    Hip abduction (seated) 4+ 4  Hip adduction 4+ 4  Hip internal rotation    Hip external rotation     Knee flexion (seated) 5 5  Knee extension 5 5  Ankle dorsiflexion 5 5  (* = pain; Blank rows = not tested)  Sensation Deferred  Reflexes Deferred  Cranial Nerves Deferred  Coordination/Cerebellar Finger to Nose: Mild dysmetria LUE Heel to Shin: Deferred Rapid alternating movements: Abnormal LUE Finger Opposition: WNL Pronator Drift: Negative  Bed mobility:Deferred  Transfers: Assistive device utilized: Environmental consultant - 2 wheeled  Sit to stand: Modified independence Stand to sit: Modified independence Chair to chair: Modified independence Floor: Deferred  Curb:  Deferred  Stairs: Level  of Assistance: Modified independence Stair Negotiation Technique: Step to Pattern with Bilateral Rails Number of Stairs: 4  Height of Stairs: 6"  Comments: Pt moves slowly and leans backwards during descent  Gait: Gait pattern: step through pattern, decreased step length- Right, decreased step length- Left, and shuffling Distance walked: 100' Assistive device utilized: Environmental consultant - 2 wheeled Level of assistance: Modified independence Comments: ataxic gait with shuffling steps;  mCTSIB: unable to complete, pt only able to stand for approximately 2-3s in condition 1;  Orthostatic vitals (11/26/23): Supine: BP: 151/66 mmHg, HR: 66 bpm, SpO2: 97% Seated: BP: 151/58 mmHg, HR: 68 bpm, SpO2: 97% Standing (1 minute): BP: 144/60 mmHg, HR: 71 bpm, SpO2: 98% Standing (3 minutes): BP: 149/63 mmHg, HR: 69 bpm, SpO2: 99%   BPPV TESTS (11/26/23):  Symptoms Duration Intensity Nystagmus  L Dix-Hallpike Dizziness/lightheadedness   None  R Dix-Hallpike Dizziness/lightheadedness   None  L Head Roll None   None  R Head Roll None   None  L Sidelying Test      R Sidelying Test      (blank = not tested)  Functional Outcome Measures  Results 11/26/23 Comments  BERG 19/56  High fall risk  DGI  8/24   FGA     TUG 19.3 seconds  Increased fall risk  5TSTS 19.7 seconds  Increased fall risk  6 Minute Walk Test      10 Meter Gait Speed Self-selected: 15.5s = 0.65 m/s; Fastest: 12.5s = 0.8 m/s  Below functional community ambulation speeds  (Blank rows = not tested)   TODAY'S TREATMENT    SUBJECTIVE: Pt reports that she is doing alright today. No falls since the last therapy session and no health changes. No specific pain reported upon arrival today.    PAIN: Denies   Therapeutic Activity NuStep L0-4 (seat 7, arms 9) x 10 minutes at start of session for warm-up, BLE strengthening, and interval history. Therapist adjusting resistance to modify challenge; Alternating 6" and 12" step taps x 10 BLE; Total Gym L22 double leg squats 2 x 15; TG L22 double leg heel raises 2 x 20; Forward and side stepping in agility ladder with verbal and visual cues to increase step length x multiple Seated large amplitude reaching forward, down, up, and out x multiple bous; Seated trunk rotation with lateral large amplitude reaching across body x multiple bouts; Standing large amplitude forward lunges with chest opening reaching x multiple bouts; Extensive ambulation outside challenging patient on inclines, declines, and sideslopes.  Verbal cues to decrease cadence and increase step length.   Not performed: Knee flex with GTB 2 x 10 reps  Seated clams with manual resistance from therapist x 10 BLE Seated adductor squeezes with manual resistance from therapist x 10 BLE; Feet apart heel/toe raises without UE support x 15; Staggered balance on 6" step eyes open/closed x 30s each BLE; Staggered stance balance eyes open/closed x 30s each BLE; Staggered stance balance horizontal/vertical head turns x 30s each BLE; Resisted gait with double black tband and faded UE support forward and backward x 4 each direction; Standing heel raise and hold 3s x 10 without UE support; Side stepping with 5# AW, BUE support, x multiple laps; Standing hip flexion marches with 3# ankle weights x 10 BLE; Standing hip abduction with 3#  ankle weights x 10 BLE; Seated LAQ with 3# ankle weights x 10 BLE; bouts each; Forward 6" step-ups alternating LE x 10 on each side; Lateral 6" step-ups with BUE support x  10 toward each direction; Feet together eyes open/closed x 30s each; Feet apart horizontal and vertical head turns x 30s each;   PATIENT EDUCATION:  Education details: Pt educated throughout session about proper posture and technique with exercises. Improved exercise technique, movement at target joints, use of target muscles after min to mod verbal, visual, tactile cues. Balance exercises Person educated: Patient Education method: Explanation and Verbal cues Education comprehension: verbalized understanding, returned demonstration, and verbal cues required   HOME EXERCISE PROGRAM:  Access Code: [email or web address] URL: [email or web address] Date: 01/10/2024 Prepared by: Ashley Lawrence  Exercises - Supine Lower Trunk Rotation  - 3 x daily - 7 x weekly - 3 sets - 10 reps - Seated Hamstring Stretch  - 3 x daily - 7 x weekly - 1 sets - 10 reps - 10secs hold - Supine Double Knee to Chest  - 3 x daily - 7 x weekly - 1 sets - 10 reps - 10sec hold - Supine Double Knee to Chest  - 1 x daily - 7 x weekly - 1 sets - 10 reps - 10secs hold - Standing 3-Way Leg Reach with Resistance at Ankles and Counter Support  - 1 x daily - 7 x weekly - 3 sets - 10 reps - Seated Knee Extension with Resistance  - 1 x daily - 7 x weekly - 3 sets - 10 reps - Standing Hamstring Curl with Resistance  - 1 x daily - 7 x weekly - 3 sets - 10 reps   ASSESSMENT:  CLINICAL IMPRESSION: Progressed dynamic gait/balance training with continued focus on longer step lengths during gait and incorporated uneven surfaces today. Also used external cues to agility ladder forward/lateral stepping. Utilized Total Gym today to challenge LE strength in balance controlled setting. Plan to progress balance and strength exercises at future sessions. Pt encouraged to  follow-up as scheduled. She will benefit from PT services to address deficits in strength, balance, and mobility in order to return to full function at home and decrease her risk for falls.  OBJECTIVE IMPAIRMENTS: decreased balance and decreased strength.   ACTIVITY LIMITATIONS: standing, squatting, stairs, and transfers  PARTICIPATION LIMITATIONS: meal prep, cleaning, laundry, shopping, and community activity  PERSONAL FACTORS: Age, Past/current experiences, Time since onset of injury/illness/exacerbation, and 3+ comorbidities: memory change, fatigue, and NPH are also affecting patient's functional outcome.   REHAB POTENTIAL: Fair    CLINICAL DECISION MAKING: Unstable/unpredictable  EVALUATION COMPLEXITY: High   GOALS: Goals reviewed with patient? Yes  SHORT TERM GOALS: Target date: 02/12/2024   Pt will be independent with HEP in order to improve strength and balance in order to decrease fall risk and improve function at home. Baseline: HEP provided  Goal status: IN progress: Not met ( Spouse reported Non Compliance)   LONG TERM GOALS: Target date: 03/11/2024   1.  Pt will improve BERG by at least 3 points in order to demonstrate clinically significant improvement in balance.   Baseline: 19/56; 01/15/24: 19/56; 20/56 on 5/1 Goal status: ONGOING  2.  Pt will improve ABC by at least 13% in order to demonstrate clinically significant improvement in balance confidence.      Baseline: 40%, 01/15/24: 65% Goal status: ACHIEVED  3. Pt will decrease 5TSTS by at least 3 seconds in order to demonstrate clinically significant improvement in LE strength      Baseline: 19.7s, 01/15/24: 15.9s;  Goal status: ACHIEVED  4. Pt will decrease TUG to below 14 seconds/decrease in order to demonstrate decreased fall risk.  Baseline: 19.3s, 01/15/24: 20.7s; 5/1 24.4secs   Goal status: ONGOING   PLAN: PT FREQUENCY: 2x/week  PT DURATION: 8 weeks  PLANNED INTERVENTIONS: Therapeutic exercises,  Therapeutic activity, Neuromuscular re-education, Balance training, Gait training, Patient/Family education, Self Care, Joint mobilization, Joint manipulation, Vestibular training, Canalith repositioning, Orthotic/Fit training, DME instructions, Dry Needling, Electrical stimulation, Spinal manipulation, Spinal mobilization, Cryotherapy, Moist heat, Taping, Traction, Ultrasound, Ionotophoresis 4mg /ml Dexamethasone, Manual therapy, and Re-evaluation.  PLAN FOR NEXT SESSION: progress balance and strength exercises, review/modify HEP as needed;   Ifeoma Vallin D Amala Petion PT, DPT, GCS  Keenan Dimitrov 02/29/2024, 12:42 PM

## 2024-03-05 NOTE — Therapy (Signed)
 OUTPATIENT PHYSICAL THERAPY BALANCE TREATMENT   Patient Name: Crystal Lynch MRN: [id number] DOB:02-09-1941, 83 y.o., female Today's Date: 03/06/2024  END OF SESSION:  PT End of Session - 03/06/24 1538     Visit Number 22    Number of Visits 33    Date for PT Re-Evaluation 03/11/24    Authorization Type eval: 11/19/23    PT Start Time 1532    PT Stop Time 1620    PT Time Calculation (min) 48 min    Equipment Utilized During Treatment Gait belt    Activity Tolerance Patient tolerated treatment well    Behavior During Therapy WFL for tasks assessed/performed            Past Medical History:  Diagnosis Date   Arthritis    Chicken pox    Endometriosis    Esophagitis    s/p esophageal dilatation   GERD (gastroesophageal reflux disease)    History of ovarian cyst    Hypercholesterolemia    Hypertension    Hypothyroidism    Past Surgical History:  Procedure Laterality Date   ABDOMINAL SURGERY     found to have an ovarian cyst and endometriosis   APPENDECTOMY     Patient Active Problem List   Diagnosis Date Noted   Low back pain 12/10/2023   Postnasal drip 12/10/2023   Bursitis of right hip 09/12/2023   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   Stress 07/27/2022   Unsteady gait 07/10/2022   Hypophosphatemia 06/11/2022   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   OSA (obstructive sleep apnea) 06/09/2022   Kidney lesion 12/14/2021   Left hip pain 12/13/2021   Memory change 11/27/2021   Fatigue 07/23/2021   Aortic atherosclerosis (HCC) 04/17/2021   Right acute serous otitis media    Hyponatremia 02/23/2021   Prolonged QT interval 02/23/2021   Dizziness 09/12/2020   Fall 03/08/2020   Fatty liver 12/28/2019   Chest pain 07/25/2019   Frequent urinary tract infections 12/24/2018   UTI (urinary tract infection) 10/11/2018   Hypercalcemia 01/09/2017   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   Sleeping difficulties 03/08/2015   Health care maintenance 01/10/2015    Osteopenia 08/10/2014   Hyperbilirubinemia 10/27/2012   Hematuria 10/27/2012   Hypertension 10/21/2012   Hypercholesterolemia 10/21/2012   Hypothyroidism 10/21/2012   Esophagitis 10/21/2012    PCP: Dellar Fenton, MD  REFERRING PROVIDER: Dellar Fenton, MD   REFERRING DIAG: R26.81 (ICD-10-CM) - Unsteady gait   RATIONALE FOR EVALUATION AND TREATMENT: Rehabilitation  THERAPY DIAG: Difficulty in walking, not elsewhere classified  Muscle weakness (generalized)  ONSET DATE: Chronic x years  FOLLOW-UP APPT SCHEDULED WITH REFERRING PROVIDER: Yes   FROM INITIAL EVALUATION SUBJECTIVE:  SUBJECTIVE STATEMENT:  Unsteadiness  PERTINENT HISTORY:  Pt referred to PT for unsteadiness and dizziness. History today obtained from both patient and husband. "I'm dizzy all the time." She denies vertigo but reports persistent unsteadiness with occasional lightheadedness. Pt has a history of NPH. She underwent a shunt trial at [facility] but there was not enough change in her testing to merit a permanent shunt placement. She is currently being followed by [facility] neurology and has a follow-up appointment this Wednesday. 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 She had a alpha-synuclein skin biopsy, EMG/NCS, echo, and CTA head and neck. 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 Recommended baby aspirin  and CTA 6 months. 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 No evidence of motor neuron disease.  Pt reports shuffling gait and freezing episodes but no tremor. She was started on carbidopa-levodopa but has not noticed any change in her symptoms since starting. She uses a rollator at home and front wheeled walker when out in the community. Pt wears eyeglasses all the time and has an upcoming  check-up with optometry. History of occasional sinus headaches but no history of migraines. Pt reports some L hip weakness.  Pain: Yes, L hip pain Numbness/Tingling: No Focal Weakness: Yes, L hip weakness related to pain; Recent changes in overall health/medication: Yes, carvidopa/levodopa Prior history of physical therapy for balance:  Yes Dominant hand: right Red flags: no personal history of cancer, abdominal pain, chills/fever, night sweats, nausea, vomiting, unrelenting pain  PRECAUTIONS: None  WEIGHT BEARING RESTRICTIONS: No  FALLS: Has patient fallen in last 6 months? Yes. Number of falls 7-8, Directional pattern for falls: Yes Backwards , often occurs when turning.   Living Environment Lives with: lives with their spouse Lives in: House/apartment Stairs: 2 to enter, railing on left during ascend Has following equipment at home: [facility] over commode, walk-in shower with seat and grab bars. Wheelchair for extended distances.   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   Hobbies: Enjoys crafting, reading.   Patient Goals: Build confidence in her balance   OBJECTIVE:   Patient Surveys  ABC: 40%  Cognition Patient is oriented to person, place, and time.  Recent memory is grossly intact however some memory deficit is evident. Remote memory is intact.  Attention span and concentration are intact.  Expressive speech is intact.  Patient's fund of knowledge is within normal limits for educational level.    Gross Musculoskeletal Assessment Tremor: None Bulk: Normal Tone: Normal  Posture: No gross abnormalities noted in standing or seated posture with the exception of some mild forward head.  AROM Deferred  LE MMT: MMT (out of 5) Right  Left   Hip flexion 5 4+  Hip extension    Hip abduction (seated) 4+ 4  Hip adduction 4+ 4  Hip internal rotation    Hip external rotation     Knee flexion (seated) 5 5  Knee extension 5 5  Ankle dorsiflexion 5 5  (* = pain; Blank rows = not tested)  Sensation Deferred  Reflexes Deferred  Cranial Nerves Deferred  Coordination/Cerebellar Finger to Nose: Mild dysmetria LUE Heel to Shin: Deferred Rapid alternating movements: Abnormal LUE Finger Opposition: WNL Pronator Drift: Negative  Bed mobility:Deferred  Transfers: Assistive device utilized: Environmental consultant - 2 wheeled  Sit to stand: Modified independence Stand to sit: Modified independence Chair to chair: Modified independence Floor: Deferred  Curb:  Deferred  Stairs: Level  of Assistance: Modified independence Stair Negotiation Technique: Step to Pattern with Bilateral Rails Number of Stairs: 4  Height of Stairs: 6"  Comments: Pt moves slowly and leans backwards during descent  Gait: Gait pattern: step through pattern, decreased step length- Right, decreased step length- Left, and shuffling Distance walked: 100' Assistive device utilized: Environmental consultant - 2 wheeled Level of assistance: Modified independence Comments: ataxic gait with shuffling steps;  mCTSIB: unable to complete, pt only able to stand for approximately 2-3s in condition 1;  Orthostatic vitals (11/26/23): Supine: BP: 151/66 mmHg, HR: 66 bpm, SpO2: 97% Seated: BP: 151/58 mmHg, HR: 68 bpm, SpO2: 97% Standing (1 minute): BP: 144/60 mmHg, HR: 71 bpm, SpO2: 98% Standing (3 minutes): BP: 149/63 mmHg, HR: 69 bpm, SpO2: 99%   BPPV TESTS (11/26/23):  Symptoms Duration Intensity Nystagmus  L Dix-Hallpike Dizziness/lightheadedness   None  R Dix-Hallpike Dizziness/lightheadedness   None  L Head Roll None   None  R Head Roll None   None  L Sidelying Test      R Sidelying Test      (blank = not tested)  Functional Outcome Measures  Results 11/26/23 Comments  BERG 19/56  High fall risk  DGI  8/24   FGA     TUG 19.3 seconds  Increased fall risk  5TSTS 19.7 seconds  Increased fall risk  6 Minute Walk Test      10 Meter Gait Speed Self-selected: 15.5s = 0.65 m/s; Fastest: 12.5s = 0.8 m/s  Below functional community ambulation speeds  (Blank rows = not tested)   TODAY'S TREATMENT    SUBJECTIVE: Pt reports that she is doing alright today. She had a fall on Saturday while getting ready to go to a graduation. She bruised her R arm but otherwise no injury. No specific pain reported upon arrival today.    PAIN: Denies   Therapeutic Activity Pre-activity vitals: BP: 152/60 mmHg, HR: 62 bpm, SpO2: 99%;  NuStep L0-5 (seat 7, arms 9) x 10 minutes at start of session for warm-up, BLE strengthening, and interval history. Therapist adjusting resistance to modify challenge;  Total Gym L22 double leg squats 2 x 15; TG L22 double leg heel raises 2 x 20; Forward and side stepping in agility ladder with verbal and visual cues to increase step length x multiple Standing heel raise holds 3s x 10; Forward high knee marching in // bars without UE support x multiple lengths; Airex feet apart static balance working on knee and hip strategies to maintain balance and prevent posterior LOB; Extensive ambulation outside challenging patient on inclines, declines, sideslopes, and across grass. Pt struggles considerably when ambulating across the grass with multiple LOB.  Verbal cues to decrease cadence and increase step length and while walking across grass cues to lift feet higher to prevent tripping.   Not performed: Alternating 6" and 12" step taps x 10 BLE; Seated clams with manual resistance from therapist x 10 BLE Seated adductor squeezes with manual resistance from therapist x 10 BLE; Staggered balance on 6" step eyes open/closed x 30s each BLE; Staggered stance balance eyes open/closed x 30s each BLE; Staggered stance balance horizontal/vertical head turns x 30s each BLE; Resisted gait with double black tband and faded UE support forward and backward x 4 each direction; Standing hip abduction with 3# ankle  weights x 10 BLE; Seated LAQ with 3# ankle weights x 10 BLE; bouts each; Forward 6" step-ups alternating LE x 10 on each side; Lateral 6" step-ups with BUE support  x 10 toward each direction; Feet together eyes open/closed x 30s each; Feet apart horizontal and vertical head turns x 30s each; Seated large amplitude reaching forward, down, up, and out x multiple bous; Seated trunk rotation with lateral large amplitude reaching across body x multiple bouts; Standing large amplitude forward lunges with chest opening reaching x multiple bouts;   PATIENT EDUCATION:  Education details: Pt educated throughout session about proper posture and technique with exercises. Improved exercise technique, movement at target joints, use of target muscles after min to mod verbal, visual, tactile cues. Balance exercises Person educated: Patient Education method: Explanation and Verbal cues Education comprehension: verbalized understanding, returned demonstration, and verbal cues required   HOME EXERCISE PROGRAM:  Access Code: [email or web address] URL: [email or web address] Date: 01/10/2024 Prepared by: Ashley Lawrence  Exercises - Supine Lower Trunk Rotation  - 3 x daily - 7 x weekly - 3 sets - 10 reps - Seated Hamstring Stretch  - 3 x daily - 7 x weekly - 1 sets - 10 reps - 10secs hold - Supine Double Knee to Chest  - 3 x daily - 7 x weekly - 1 sets - 10 reps - 10sec hold - Supine Double Knee to Chest  - 1 x daily - 7 x weekly - 1 sets - 10 reps - 10secs hold - Standing 3-Way Leg Reach with Resistance at Ankles and Counter Support  - 1 x daily - 7 x weekly - 3 sets - 10 reps - Seated Knee Extension with Resistance  - 1 x daily - 7 x weekly - 3 sets - 10 reps - Standing Hamstring Curl with Resistance  - 1 x daily - 7 x weekly - 3 sets - 10 reps   ASSESSMENT:  CLINICAL IMPRESSION: Progressed dynamic gait/balance training with continued focus on longer step lengths during gait and incorporated gait  outside across the grass. Also used external cues to agility ladder forward/lateral stepping. Utilized Total Gym again today to challenge LE strength in a controlled setting. Plan to progress balance and strength exercises at future sessions. Pt encouraged to follow-up as scheduled. She will benefit from PT services to address deficits in strength, balance, and mobility in order to return to full function at home and decrease her risk for falls.  OBJECTIVE IMPAIRMENTS: decreased balance and decreased strength.   ACTIVITY LIMITATIONS: standing, squatting, stairs, and transfers  PARTICIPATION LIMITATIONS: meal prep, cleaning, laundry, shopping, and community activity  PERSONAL FACTORS: Age, Past/current experiences, Time since onset of injury/illness/exacerbation, and 3+ comorbidities: memory change, fatigue, and NPH are also affecting patient's functional outcome.   REHAB POTENTIAL: Fair    CLINICAL DECISION MAKING: Unstable/unpredictable  EVALUATION COMPLEXITY: High   GOALS: Goals reviewed with patient? Yes  SHORT TERM GOALS: Target date: 02/12/2024   Pt will be independent with HEP in order to improve strength and balance in order to decrease fall risk and improve function at home. Baseline: HEP provided  Goal status: IN progress: Not met ( Spouse reported Non Compliance)   LONG TERM GOALS: Target date: 03/11/2024   1.  Pt will improve BERG by at least 3 points in order to demonstrate clinically significant improvement in balance.   Baseline: 19/56; 01/15/24: 19/56; 20/56 on 5/1 Goal status: ONGOING  2.  Pt will improve ABC by at least 13% in order to demonstrate clinically significant improvement in balance confidence.      Baseline: 40%, 01/15/24: 65% Goal status: ACHIEVED  3. Pt will decrease 5TSTS by at least 3 seconds  in order to demonstrate clinically significant improvement in LE strength      Baseline: 19.7s, 01/15/24: 15.9s;  Goal status: ACHIEVED  4. Pt will decrease  TUG to below 14 seconds/decrease in order to demonstrate decreased fall risk.  Baseline: 19.3s, 01/15/24: 20.7s; 5/1 24.4secs   Goal status: ONGOING   PLAN: PT FREQUENCY: 2x/week  PT DURATION: 8 weeks  PLANNED INTERVENTIONS: Therapeutic exercises, Therapeutic activity, Neuromuscular re-education, Balance training, Gait training, Patient/Family education, Self Care, Joint mobilization, Joint manipulation, Vestibular training, Canalith repositioning, Orthotic/Fit training, DME instructions, Dry Needling, Electrical stimulation, Spinal manipulation, Spinal mobilization, Cryotherapy, Moist heat, Taping, Traction, Ultrasound, Ionotophoresis 4mg /ml Dexamethasone, Manual therapy, and Re-evaluation.  PLAN FOR NEXT SESSION: progress balance and strength exercises, review/modify HEP as needed;   Qais Jowers D Milea Klink PT, DPT, GCS  Lakyn Mantione 03/06/2024, 4:56 PM

## 2024-03-06 ENCOUNTER — Ambulatory Visit

## 2024-03-06 DIAGNOSIS — M6281 Muscle weakness (generalized): Secondary | ICD-10-CM

## 2024-03-06 DIAGNOSIS — R262 Difficulty in walking, not elsewhere classified: Secondary | ICD-10-CM | POA: Diagnosis not present

## 2024-03-06 NOTE — Therapy (Signed)
 OUTPATIENT PHYSICAL THERAPY BALANCE TREATMENT/RECERTIFICATION   Patient Name: Crystal Lynch MRN: [id number] DOB:1941-01-23, 83 y.o., female Today's Date: 03/11/2024  END OF SESSION:  PT End of Session - 03/11/24 1458     Visit Number 23    Number of Visits 45    Date for PT Re-Evaluation 04/22/24    Authorization Type eval: 11/19/23    PT Start Time 1447    PT Stop Time 1530    PT Time Calculation (min) 43 min    Equipment Utilized During Treatment Gait belt    Activity Tolerance Patient tolerated treatment well    Behavior During Therapy WFL for tasks assessed/performed            Past Medical History:  Diagnosis Date   Arthritis    Chicken pox    Endometriosis    Esophagitis    s/p esophageal dilatation   GERD (gastroesophageal reflux disease)    History of ovarian cyst    Hypercholesterolemia    Hypertension    Hypothyroidism    Past Surgical History:  Procedure Laterality Date   ABDOMINAL SURGERY     found to have an ovarian cyst and endometriosis   APPENDECTOMY     Patient Active Problem List   Diagnosis Date Noted   Low back pain 12/10/2023   Postnasal drip 12/10/2023   Bursitis of right hip 09/12/2023   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   Stress 07/27/2022   Unsteady gait 07/10/2022   Hypophosphatemia 06/11/2022   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   OSA (obstructive sleep apnea) 06/09/2022   Kidney lesion 12/14/2021   Left hip pain 12/13/2021   Memory change 11/27/2021   Fatigue 07/23/2021   Aortic atherosclerosis (HCC) 04/17/2021   Right acute serous otitis media    Hyponatremia 02/23/2021   Prolonged QT interval 02/23/2021   Dizziness 09/12/2020   Fall 03/08/2020   Fatty liver 12/28/2019   Chest pain 07/25/2019   Frequent urinary tract infections 12/24/2018   UTI (urinary tract infection) 10/11/2018   Hypercalcemia 01/09/2017   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   Sleeping difficulties 03/08/2015   Health care  maintenance 01/10/2015   Osteopenia 08/10/2014   Hyperbilirubinemia 10/27/2012   Hematuria 10/27/2012   Hypertension 10/21/2012   Hypercholesterolemia 10/21/2012   Hypothyroidism 10/21/2012   Esophagitis 10/21/2012    PCP: Dellar Fenton, MD  REFERRING PROVIDER: Dellar Fenton, MD   REFERRING DIAG: R26.81 (ICD-10-CM) - Unsteady gait   RATIONALE FOR EVALUATION AND TREATMENT: Rehabilitation  THERAPY DIAG: Difficulty in walking, not elsewhere classified  Muscle weakness (generalized)  ONSET DATE: Chronic x years  FOLLOW-UP APPT SCHEDULED WITH REFERRING PROVIDER: Yes   FROM INITIAL EVALUATION SUBJECTIVE:  SUBJECTIVE STATEMENT:  Unsteadiness  PERTINENT HISTORY:  Pt referred to PT for unsteadiness and dizziness. History today obtained from both patient and husband. "I'm dizzy all the time." She denies vertigo but reports persistent unsteadiness with occasional lightheadedness. Pt has a history of NPH. She underwent a shunt trial at [facility] but there was not enough change in her testing to merit a permanent shunt placement. She is currently being followed by [facility] neurology and has a follow-up appointment this Wednesday. 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 She had a alpha-synuclein skin biopsy, EMG/NCS, echo, and CTA head and neck. 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 Recommended baby aspirin  and CTA 6 months. 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 No evidence of motor neuron disease.  Pt reports shuffling gait and freezing episodes but no tremor. She was started on carbidopa-levodopa but has not noticed any change in her symptoms since starting. She uses a rollator at home and front wheeled walker when out in the community. Pt wears eyeglasses all the  time and has an upcoming check-up with optometry. History of occasional sinus headaches but no history of migraines. Pt reports some L hip weakness.  Pain: Yes, L hip pain Numbness/Tingling: No Focal Weakness: Yes, L hip weakness related to pain; Recent changes in overall health/medication: Yes, carvidopa/levodopa Prior history of physical therapy for balance:  Yes Dominant hand: right Red flags: no personal history of cancer, abdominal pain, chills/fever, night sweats, nausea, vomiting, unrelenting pain  PRECAUTIONS: None  WEIGHT BEARING RESTRICTIONS: No  FALLS: Has patient fallen in last 6 months? Yes. Number of falls 7-8, Directional pattern for falls: Yes Backwards , often occurs when turning.   Living Environment Lives with: lives with their spouse Lives in: House/apartment Stairs: 2 to enter, railing on left during ascend Has following equipment at home: [facility] over commode, walk-in shower with seat and grab bars. Wheelchair for extended distances.   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   Hobbies: Enjoys crafting, reading.   Patient Goals: Build confidence in her balance   OBJECTIVE:   Patient Surveys  ABC: 40%  Cognition Patient is oriented to person, place, and time.  Recent memory is grossly intact however some memory deficit is evident. Remote memory is intact.  Attention span and concentration are intact.  Expressive speech is intact.  Patient's fund of knowledge is within normal limits for educational level.    Gross Musculoskeletal Assessment Tremor: None Bulk: Normal Tone: Normal  Posture: No gross abnormalities noted in standing or seated posture with the exception of some mild forward head.  AROM Deferred  LE MMT: MMT (out of 5) Right  Left   Hip flexion 5 4+  Hip extension    Hip abduction (seated) 4+ 4  Hip adduction 4+ 4  Hip internal rotation    Hip  external rotation    Knee flexion (seated) 5 5  Knee extension 5 5  Ankle dorsiflexion 5 5  (* = pain; Blank rows = not tested)  Sensation Deferred  Reflexes Deferred  Cranial Nerves Deferred  Coordination/Cerebellar Finger to Nose: Mild dysmetria LUE Heel to Shin: Deferred Rapid alternating movements: Abnormal LUE Finger Opposition: WNL Pronator Drift: Negative  Bed mobility:Deferred  Transfers: Assistive device utilized: Environmental consultant - 2 wheeled  Sit to stand: Modified independence Stand to sit: Modified independence Chair to chair: Modified independence Floor: Deferred  Curb:  Deferred  Stairs: Level  of Assistance: Modified independence Stair Negotiation Technique: Step to Pattern with Bilateral Rails Number of Stairs: 4  Height of Stairs: 6"  Comments: Pt moves slowly and leans backwards during descent  Gait: Gait pattern: step through pattern, decreased step length- Right, decreased step length- Left, and shuffling Distance walked: 100' Assistive device utilized: Environmental consultant - 2 wheeled Level of assistance: Modified independence Comments: ataxic gait with shuffling steps;  mCTSIB: unable to complete, pt only able to stand for approximately 2-3s in condition 1;  Orthostatic vitals (11/26/23): Supine: BP: 151/66 mmHg, HR: 66 bpm, SpO2: 97% Seated: BP: 151/58 mmHg, HR: 68 bpm, SpO2: 97% Standing (1 minute): BP: 144/60 mmHg, HR: 71 bpm, SpO2: 98% Standing (3 minutes): BP: 149/63 mmHg, HR: 69 bpm, SpO2: 99%   BPPV TESTS (11/26/23):  Symptoms Duration Intensity Nystagmus  L Dix-Hallpike Dizziness/lightheadedness   None  R Dix-Hallpike Dizziness/lightheadedness   None  L Head Roll None   None  R Head Roll None   None  L Sidelying Test      R Sidelying Test      (blank = not tested)  Functional Outcome Measures  Results 11/26/23 Comments  BERG 19/56  High fall risk  DGI  8/24   FGA     TUG 19.3 seconds  Increased fall risk  5TSTS 19.7 seconds  Increased fall risk   6 Minute Walk Test     10 Meter Gait Speed Self-selected: 15.5s = 0.65 m/s; Fastest: 12.5s = 0.8 m/s  Below functional community ambulation speeds  (Blank rows = not tested)   TODAY'S TREATMENT    SUBJECTIVE: Pt reports that she is doing alright today. She had a fall on Saturday while getting ready to go to a graduation. She bruised her R arm but otherwise no injury. No specific pain reported upon arrival today.    PAIN: Denies   Therapeutic Activity NuStep L0-5 (seat 7, arms 9) x 8 minutes at start of session for warm-up, BLE strengthening, and interval history. Therapist adjusting resistance to modify challenge;  Updated outcome measures: BERG: 19/56; 5TSTS: 17.3s TUG: 17.2s; ABC: 60.6% Sit to stand 2 x 10; Extensive ambulation outside challenging patient on inclines, declines, sideslopes, and down curb. Pt struggles whenever she first approaches an uneven surface.  Verbal cues to decrease cadence and increase step length. Utilized a metronome with beat at 95 spm to help decrease her cadence;   PATIENT EDUCATION:  Education details: Outcome measures Person educated: Patient Education method: Explanation and Verbal cues Education comprehension: verbalized understanding, returned demonstration, and verbal cues required   HOME EXERCISE PROGRAM:  Access Code: [email or web address] URL: [email or web address] Date: 03/11/2024 Prepared by: Crawford Dock  Exercises - Seated Hip Abduction with Resistance  - 1 x daily - 7 x weekly - 2 sets - 10 reps - 3s hold - Seated March with Resistance  - 1 x daily - 7 x weekly - 2 sets - 10 reps - 3s hold - Seated Hip Adduction Isometrics with Ball  - 1 x daily - 7 x weekly - 2 sets - 10 reps - 3s hold - Seated Long Arc Quad  - 1 x daily - 7 x weekly - 2 sets - 10 reps - 3s hold - Mini Squats with Walker and Chair  - 1 x daily - 7 x weekly - 2 sets - 10 reps   ASSESSMENT:  CLINICAL IMPRESSION: Updated outcome measures with patient  during visit today.  She demonstrates improvement in her balance confidence scoring on  the ABC since the initial evaluation however relatively unchanged since the last goal update. Her leg strength on the 5TSTS is relatively unchanged since the last update. Her BERG also remains unchanged but her TUG has improved and she demonstrates considerable improvement in her safety with turns. Subjectively, patient reports slight improvement in balance and leg strength since starting with therapy. Therapist has been also working with patient on outdoor ambulation across uneven surfaces, grass, and curbs and her safety with these environments has improved. Plan to progress balance and strength exercises at future sessions while incorporating cognitive tasks. Pt encouraged to follow-up as scheduled. No HEP updates on this date. Pt will benefit from PT services to address deficits in strength, balance, and mobility in order to return to full function at home and decrease her risk for falls.    OBJECTIVE IMPAIRMENTS: decreased balance and decreased strength.   ACTIVITY LIMITATIONS: standing, squatting, stairs, and transfers  PARTICIPATION LIMITATIONS: meal prep, cleaning, laundry, shopping, and community activity  PERSONAL FACTORS: Age, Past/current experiences, Time since onset of injury/illness/exacerbation, and 3+ comorbidities: memory change, fatigue, and NPH are also affecting patient's functional outcome.   REHAB POTENTIAL: Fair    CLINICAL DECISION MAKING: Unstable/unpredictable  EVALUATION COMPLEXITY: High   GOALS: Goals reviewed with patient? Yes  SHORT TERM GOALS: Target date: 04/01/24  Pt will be independent with HEP in order to improve strength and balance in order to decrease fall risk and improve function at home. Baseline: HEP provided  Goal status: IN progress: Not met ( Spouse reported Non Compliance)   LONG TERM GOALS: Target date: 04/22/2024   1.  Pt will improve BERG by at least 3  points in order to demonstrate clinically significant improvement in balance.   Baseline: 19/56; 01/15/24: 19/56; 20/56 on 5/1; 03/11/24: 19/56; Goal status: ONGOING  2.  Pt will improve ABC by at least 13% in order to demonstrate clinically significant improvement in balance confidence.      Baseline: 40%, 01/15/24: 65%; 03/11/24: 60.6%; Goal status: ACHIEVED  3. Pt will decrease 5TSTS by at least 3 seconds in order to demonstrate clinically significant improvement in LE strength      Baseline: 19.7s, 01/15/24: 15.9s; 03/11/24: 17.3s Goal status: PARTIALLY MET  4. Pt will decrease TUG to below 14 seconds/decrease in order to demonstrate decreased fall risk.  Baseline: 19.3s, 01/15/24: 20.7s; 5/1 24.4secs 03/11/24: 17.2s   Goal status: PARTIALLY MET   PLAN: PT FREQUENCY: 1-2x/week  PT DURATION: 6 weeks  PLANNED INTERVENTIONS: Therapeutic exercises, Therapeutic activity, Neuromuscular re-education, Balance training, Gait training, Patient/Family education, Self Care, Joint mobilization, Joint manipulation, Vestibular training, Canalith repositioning, Orthotic/Fit training, DME instructions, Dry Needling, Electrical stimulation, Spinal manipulation, Spinal mobilization, Cryotherapy, Moist heat, Taping, Traction, Ultrasound, Ionotophoresis 4mg /ml Dexamethasone, Manual therapy, and Re-evaluation.  PLAN FOR NEXT SESSION: progress balance and strength exercises, review/modify HEP as needed;   Mariette Cowley D Nari Vannatter PT, DPT, GCS  Tyrees Chopin 03/11/2024, 5:23 PM

## 2024-03-11 ENCOUNTER — Ambulatory Visit

## 2024-03-11 DIAGNOSIS — M6281 Muscle weakness (generalized): Secondary | ICD-10-CM

## 2024-03-11 DIAGNOSIS — R262 Difficulty in walking, not elsewhere classified: Secondary | ICD-10-CM | POA: Diagnosis not present

## 2024-03-20 ENCOUNTER — Ambulatory Visit

## 2024-03-20 DIAGNOSIS — R262 Difficulty in walking, not elsewhere classified: Secondary | ICD-10-CM | POA: Diagnosis not present

## 2024-03-20 DIAGNOSIS — M6281 Muscle weakness (generalized): Secondary | ICD-10-CM

## 2024-03-20 NOTE — Therapy (Signed)
 OUTPATIENT PHYSICAL THERAPY BALANCE TREATMENT   Patient Name: Crystal Lynch MRN: [id number] DOB:07-Feb-1941, 83 y.o., female Today's Date: 03/20/2024  END OF SESSION:  PT End of Session - 03/20/24 0941     Visit Number 24    Number of Visits 45    Date for PT Re-Evaluation 04/22/24    Authorization Type eval: 11/19/23    PT Start Time 0931    PT Stop Time 1017    PT Time Calculation (min) 46 min    Equipment Utilized During Treatment Gait belt    Activity Tolerance Patient tolerated treatment well    Behavior During Therapy WFL for tasks assessed/performed            Past Medical History:  Diagnosis Date   Arthritis    Chicken pox    Endometriosis    Esophagitis    s/p esophageal dilatation   GERD (gastroesophageal reflux disease)    History of ovarian cyst    Hypercholesterolemia    Hypertension    Hypothyroidism    Past Surgical History:  Procedure Laterality Date   ABDOMINAL SURGERY     found to have an ovarian cyst and endometriosis   APPENDECTOMY     Patient Active Problem List   Diagnosis Date Noted   Low back pain 12/10/2023   Postnasal drip 12/10/2023   Bursitis of right hip 09/12/2023   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   Stress 07/27/2022   Unsteady gait 07/10/2022   Hypophosphatemia 06/11/2022   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   OSA (obstructive sleep apnea) 06/09/2022   Kidney lesion 12/14/2021   Left hip pain 12/13/2021   Memory change 11/27/2021   Fatigue 07/23/2021   Aortic atherosclerosis (HCC) 04/17/2021   Right acute serous otitis media    Hyponatremia 02/23/2021   Prolonged QT interval 02/23/2021   Dizziness 09/12/2020   Fall 03/08/2020   Fatty liver 12/28/2019   Chest pain 07/25/2019   Frequent urinary tract infections 12/24/2018   UTI (urinary tract infection) 10/11/2018   Hypercalcemia 01/09/2017   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   Sleeping difficulties 03/08/2015   Health care maintenance 01/10/2015    Osteopenia 08/10/2014   Hyperbilirubinemia 10/27/2012   Hematuria 10/27/2012   Hypertension 10/21/2012   Hypercholesterolemia 10/21/2012   Hypothyroidism 10/21/2012   Esophagitis 10/21/2012    PCP: Dellar Fenton, MD  REFERRING PROVIDER: Dellar Fenton, MD   REFERRING DIAG: R26.81 (ICD-10-CM) - Unsteady gait   RATIONALE FOR EVALUATION AND TREATMENT: Rehabilitation  THERAPY DIAG: Difficulty in walking, not elsewhere classified  Muscle weakness (generalized)  ONSET DATE: Chronic x years  FOLLOW-UP APPT SCHEDULED WITH REFERRING PROVIDER: Yes   FROM INITIAL EVALUATION SUBJECTIVE:  SUBJECTIVE STATEMENT:  Unsteadiness  PERTINENT HISTORY:  Pt referred to PT for unsteadiness and dizziness. History today obtained from both patient and husband. "I'm dizzy all the time." She denies vertigo but reports persistent unsteadiness with occasional lightheadedness. Pt has a history of NPH. She underwent a shunt trial at [facility] but there was not enough change in her testing to merit a permanent shunt placement. She is currently being followed by [facility] neurology and has a follow-up appointment this Wednesday. 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 She had a alpha-synuclein skin biopsy, EMG/NCS, echo, and CTA head and neck. 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 Recommended baby aspirin  and CTA 6 months. 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 No evidence of motor neuron disease.  Pt reports shuffling gait and freezing episodes but no tremor. She was started on carbidopa-levodopa but has not noticed any change in her symptoms since starting. She uses a rollator at home and front wheeled walker when out in the community. Pt wears eyeglasses all the time and has an upcoming  check-up with optometry. History of occasional sinus headaches but no history of migraines. Pt reports some L hip weakness.  Pain: Yes, L hip pain Numbness/Tingling: No Focal Weakness: Yes, L hip weakness related to pain; Recent changes in overall health/medication: Yes, carvidopa/levodopa Prior history of physical therapy for balance:  Yes Dominant hand: right Red flags: no personal history of cancer, abdominal pain, chills/fever, night sweats, nausea, vomiting, unrelenting pain  PRECAUTIONS: None  WEIGHT BEARING RESTRICTIONS: No  FALLS: Has patient fallen in last 6 months? Yes. Number of falls 7-8, Directional pattern for falls: Yes Backwards , often occurs when turning.   Living Environment Lives with: lives with their spouse Lives in: House/apartment Stairs: 2 to enter, railing on left during ascend Has following equipment at home: [facility] over commode, walk-in shower with seat and grab bars. Wheelchair for extended distances.   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   Hobbies: Enjoys crafting, reading.   Patient Goals: Build confidence in her balance   OBJECTIVE:   Patient Surveys  ABC: 40%  Cognition Patient is oriented to person, place, and time.  Recent memory is grossly intact however some memory deficit is evident. Remote memory is intact.  Attention span and concentration are intact.  Expressive speech is intact.  Patient's fund of knowledge is within normal limits for educational level.    Gross Musculoskeletal Assessment Tremor: None Bulk: Normal Tone: Normal  Posture: No gross abnormalities noted in standing or seated posture with the exception of some mild forward head.  AROM Deferred  LE MMT: MMT (out of 5) Right  Left   Hip flexion 5 4+  Hip extension    Hip abduction (seated) 4+ 4  Hip adduction 4+ 4  Hip internal rotation    Hip external rotation     Knee flexion (seated) 5 5  Knee extension 5 5  Ankle dorsiflexion 5 5  (* = pain; Blank rows = not tested)  Sensation Deferred  Reflexes Deferred  Cranial Nerves Deferred  Coordination/Cerebellar Finger to Nose: Mild dysmetria LUE Heel to Shin: Deferred Rapid alternating movements: Abnormal LUE Finger Opposition: WNL Pronator Drift: Negative  Bed mobility:Deferred  Transfers: Assistive device utilized: Environmental consultant - 2 wheeled  Sit to stand: Modified independence Stand to sit: Modified independence Chair to chair: Modified independence Floor: Deferred  Curb:  Deferred  Stairs: Level  of Assistance: Modified independence Stair Negotiation Technique: Step to Pattern with Bilateral Rails Number of Stairs: 4  Height of Stairs: 6"  Comments: Pt moves slowly and leans backwards during descent  Gait: Gait pattern: step through pattern, decreased step length- Right, decreased step length- Left, and shuffling Distance walked: 100' Assistive device utilized: Environmental consultant - 2 wheeled Level of assistance: Modified independence Comments: ataxic gait with shuffling steps;  mCTSIB: unable to complete, pt only able to stand for approximately 2-3s in condition 1;  Orthostatic vitals (11/26/23): Supine: BP: 151/66 mmHg, HR: 66 bpm, SpO2: 97% Seated: BP: 151/58 mmHg, HR: 68 bpm, SpO2: 97% Standing (1 minute): BP: 144/60 mmHg, HR: 71 bpm, SpO2: 98% Standing (3 minutes): BP: 149/63 mmHg, HR: 69 bpm, SpO2: 99%   BPPV TESTS (11/26/23):  Symptoms Duration Intensity Nystagmus  L Dix-Hallpike Dizziness/lightheadedness   None  R Dix-Hallpike Dizziness/lightheadedness   None  L Head Roll None   None  R Head Roll None   None  L Sidelying Test      R Sidelying Test      (blank = not tested)  Functional Outcome Measures  Results 11/26/23 Comments  BERG 19/56  High fall risk  DGI  8/24   FGA     TUG 19.3 seconds  Increased fall risk  5TSTS 19.7 seconds  Increased fall risk  6 Minute Walk Test      10 Meter Gait Speed Self-selected: 15.5s = 0.65 m/s; Fastest: 12.5s = 0.8 m/s  Below functional community ambulation speeds  (Blank rows = not tested)   TODAY'S TREATMENT    SUBJECTIVE: Pt reports that she is doing alright today. No falls since the last therapy session. No significant health changes and no specific questions or concern. No specific pain reported upon arrival today.    PAIN: Denies   Therapeutic Activity NuStep L0-4 (seat 7, arms 9) x 10 minutes at start of session for warm-up, BLE strengthening, and interval history. Therapist adjusting resistance to modify challenge;  Stair practice with single UE support on rail, cues to move slowly and stabilize prior to taking each additional step;; Forward and side stepping in agility ladder with verbal and visual cues to increase step length x multiple Sit to stand 2 x 10; Seated large amplitude reaching forward, down, up, and out x multiple bous; Seated trunk rotation with lateral large amplitude reaching across body x multiple bouts; Standing large amplitude forward lunges with chest opening reaching x multiple bouts; Extensive ambulation outside challenging patient on inclines, declines, sideslopes, and ascending/descending curb. Also practiced outdoor staircase with single UE support. Pt struggles whenever she first approaches an uneven or different surface.  Verbal cues to decrease cadence and increase step length. Utilized a metronome with beat at 95 spm to help decrease her cadence;   Not performed: Alternating 6" and 12" step taps x 10 BLE; Seated clams with manual resistance from therapist x 10 BLE Seated adductor squeezes with manual resistance from therapist x 10 BLE; Staggered balance on 6" step eyes open/closed x 30s each BLE; Staggered stance balance eyes open/closed x 30s each BLE; Staggered stance balance horizontal/vertical head turns x 30s each BLE; Resisted gait with double black tband and faded UE support  forward and backward x 4 each direction; Standing hip abduction with 3# ankle weights x 10 BLE; Seated LAQ with 3# ankle weights x 10 BLE; bouts each; Lateral 6" step-ups with BUE support x 10 toward each direction; Feet together eyes open/closed x 30s each;  Feet apart horizontal and vertical head turns x 30s each; Standing heel raise holds 3s x 10; Forward high knee marching in // bars without UE support x multiple lengths; Airex feet apart static balance working on knee and hip strategies to maintain balance and prevent posterior LOB; Total Gym L22 double leg squats 2 x 15; TG L22 double leg heel raises 2 x 20;    PATIENT EDUCATION:  Education details: Outcome measures Person educated: Patient Education method: Explanation and Verbal cues Education comprehension: verbalized understanding, returned demonstration, and verbal cues required   HOME EXERCISE PROGRAM:  Access Code: [email or web address] URL: [email or web address] Date: 03/11/2024 Prepared by: Crawford Dock  Exercises - Seated Hip Abduction with Resistance  - 1 x daily - 7 x weekly - 2 sets - 10 reps - 3s hold - Seated March with Resistance  - 1 x daily - 7 x weekly - 2 sets - 10 reps - 3s hold - Seated Hip Adduction Isometrics with Ball  - 1 x daily - 7 x weekly - 2 sets - 10 reps - 3s hold - Seated Long Arc Quad  - 1 x daily - 7 x weekly - 2 sets - 10 reps - 3s hold - Mini Squats with Walker and Chair  - 1 x daily - 7 x weekly - 2 sets - 10 reps   ASSESSMENT:  CLINICAL IMPRESSION: Progressed dynamic gait/balance training with continued focus on longer step lengths during gait. Repeated gait outside across a variety of inclines/declines/curbs. Continued to utilize external verbal and visual cues to improve step length and cadence. Plan to progress balance and strength exercises at future sessions. Pt encouraged to follow-up as scheduled. She will benefit from PT services to address deficits in strength, balance, and  mobility in order to return to full function at home and decrease her risk for falls.  OBJECTIVE IMPAIRMENTS: decreased balance and decreased strength.   ACTIVITY LIMITATIONS: standing, squatting, stairs, and transfers  PARTICIPATION LIMITATIONS: meal prep, cleaning, laundry, shopping, and community activity  PERSONAL FACTORS: Age, Past/current experiences, Time since onset of injury/illness/exacerbation, and 3+ comorbidities: memory change, fatigue, and NPH are also affecting patient's functional outcome.   REHAB POTENTIAL: Fair    CLINICAL DECISION MAKING: Unstable/unpredictable  EVALUATION COMPLEXITY: High   GOALS: Goals reviewed with patient? Yes  SHORT TERM GOALS: Target date: 04/01/24  Pt will be independent with HEP in order to improve strength and balance in order to decrease fall risk and improve function at home. Baseline: HEP provided  Goal status: IN progress: Not met ( Spouse reported Non Compliance)   LONG TERM GOALS: Target date: 04/22/2024   1.  Pt will improve BERG by at least 3 points in order to demonstrate clinically significant improvement in balance.   Baseline: 19/56; 01/15/24: 19/56; 20/56 on 5/1; 03/11/24: 19/56; Goal status: ONGOING  2.  Pt will improve ABC by at least 13% in order to demonstrate clinically significant improvement in balance confidence.      Baseline: 40%, 01/15/24: 65%; 03/11/24: 60.6%; Goal status: ACHIEVED  3. Pt will decrease 5TSTS by at least 3 seconds in order to demonstrate clinically significant improvement in LE strength      Baseline: 19.7s, 01/15/24: 15.9s; 03/11/24: 17.3s Goal status: PARTIALLY MET  4. Pt will decrease TUG to below 14 seconds/decrease in order to demonstrate decreased fall risk.  Baseline: 19.3s, 01/15/24: 20.7s; 5/1 24.4secs 03/11/24: 17.2s   Goal status: PARTIALLY MET   PLAN: PT FREQUENCY: 1-2x/week  PT DURATION: 6 weeks  PLANNED INTERVENTIONS: Therapeutic exercises, Therapeutic activity, Neuromuscular  re-education, Balance training, Gait training, Patient/Family education, Self Care, Joint mobilization, Joint manipulation, Vestibular training, Canalith repositioning, Orthotic/Fit training, DME instructions, Dry Needling, Electrical stimulation, Spinal manipulation, Spinal mobilization, Cryotherapy, Moist heat, Taping, Traction, Ultrasound, Ionotophoresis 4mg /ml Dexamethasone, Manual therapy, and Re-evaluation.  PLAN FOR NEXT SESSION: progress balance and strength exercises, review/modify HEP as needed;   Mirta Mally D Celita Aron PT, DPT, GCS  Kamyrah Feeser 03/20/2024, 1:11 PM

## 2024-03-25 ENCOUNTER — Ambulatory Visit: Attending: Internal Medicine

## 2024-03-25 DIAGNOSIS — R2689 Other abnormalities of gait and mobility: Secondary | ICD-10-CM | POA: Insufficient documentation

## 2024-03-25 DIAGNOSIS — M6281 Muscle weakness (generalized): Secondary | ICD-10-CM | POA: Insufficient documentation

## 2024-03-25 DIAGNOSIS — R278 Other lack of coordination: Secondary | ICD-10-CM | POA: Diagnosis present

## 2024-03-25 DIAGNOSIS — R262 Difficulty in walking, not elsewhere classified: Secondary | ICD-10-CM | POA: Insufficient documentation

## 2024-03-25 DIAGNOSIS — R2681 Unsteadiness on feet: Secondary | ICD-10-CM | POA: Insufficient documentation

## 2024-03-25 NOTE — Therapy (Signed)
 OUTPATIENT PHYSICAL THERAPY BALANCE TREATMENT   Patient Name: Crystal Lynch MRN: [id number] DOB:1941/09/14, 83 y.o., female Today's Date: 03/26/2024  END OF SESSION:  PT End of Session - 03/26/24 1042     Visit Number 25    Number of Visits 45    Date for PT Re-Evaluation 04/22/24    Authorization Type eval: 11/19/23    PT Start Time 1705    PT Stop Time 1750    PT Time Calculation (min) 45 min    Equipment Utilized During Treatment Gait belt    Activity Tolerance Patient tolerated treatment well    Behavior During Therapy WFL for tasks assessed/performed            Past Medical History:  Diagnosis Date   Arthritis    Chicken pox    Endometriosis    Esophagitis    s/p esophageal dilatation   GERD (gastroesophageal reflux disease)    History of ovarian cyst    Hypercholesterolemia    Hypertension    Hypothyroidism    Past Surgical History:  Procedure Laterality Date   ABDOMINAL SURGERY     found to have an ovarian cyst and endometriosis   APPENDECTOMY     Patient Active Problem List   Diagnosis Date Noted   Low back pain 12/10/2023   Postnasal drip 12/10/2023   Bursitis of right hip 09/12/2023   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   Stress 07/27/2022   Unsteady gait 07/10/2022   Hypophosphatemia 06/11/2022   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   OSA (obstructive sleep apnea) 06/09/2022   Kidney lesion 12/14/2021   Left hip pain 12/13/2021   Memory change 11/27/2021   Fatigue 07/23/2021   Aortic atherosclerosis (HCC) 04/17/2021   Right acute serous otitis media    Hyponatremia 02/23/2021   Prolonged QT interval 02/23/2021   Dizziness 09/12/2020   Fall 03/08/2020   Fatty liver 12/28/2019   Chest pain 07/25/2019   Frequent urinary tract infections 12/24/2018   UTI (urinary tract infection) 10/11/2018   Hypercalcemia 01/09/2017   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   Sleeping difficulties 03/08/2015   Health care maintenance 01/10/2015    Osteopenia 08/10/2014   Hyperbilirubinemia 10/27/2012   Hematuria 10/27/2012   Hypertension 10/21/2012   Hypercholesterolemia 10/21/2012   Hypothyroidism 10/21/2012   Esophagitis 10/21/2012    PCP: Dellar Fenton, MD  REFERRING PROVIDER: Dellar Fenton, MD   REFERRING DIAG: R26.81 (ICD-10-CM) - Unsteady gait   RATIONALE FOR EVALUATION AND TREATMENT: Rehabilitation  THERAPY DIAG: Difficulty in walking, not elsewhere classified  Muscle weakness (generalized)  ONSET DATE: Chronic x years  FOLLOW-UP APPT SCHEDULED WITH REFERRING PROVIDER: Yes   FROM INITIAL EVALUATION SUBJECTIVE:  SUBJECTIVE STATEMENT:  Unsteadiness  PERTINENT HISTORY:  Pt referred to PT for unsteadiness and dizziness. History today obtained from both patient and husband. "I'm dizzy all the time." She denies vertigo but reports persistent unsteadiness with occasional lightheadedness. Pt has a history of NPH. She underwent a shunt trial at [facility] but there was not enough change in her testing to merit a permanent shunt placement. She is currently being followed by [facility] neurology and has a follow-up appointment this Wednesday. 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 She had a alpha-synuclein skin biopsy, EMG/NCS, echo, and CTA head and neck. 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 Recommended baby aspirin  and CTA 6 months. 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 No evidence of motor neuron disease.  Pt reports shuffling gait and freezing episodes but no tremor. She was started on carbidopa-levodopa but has not noticed any change in her symptoms since starting. She uses a rollator at home and front wheeled walker when out in the community. Pt wears eyeglasses all the time and has an upcoming  check-up with optometry. History of occasional sinus headaches but no history of migraines. Pt reports some L hip weakness.  Pain: Yes, L hip pain Numbness/Tingling: No Focal Weakness: Yes, L hip weakness related to pain; Recent changes in overall health/medication: Yes, carvidopa/levodopa Prior history of physical therapy for balance:  Yes Dominant hand: right Red flags: no personal history of cancer, abdominal pain, chills/fever, night sweats, nausea, vomiting, unrelenting pain  PRECAUTIONS: None  WEIGHT BEARING RESTRICTIONS: No  FALLS: Has patient fallen in last 6 months? Yes. Number of falls 7-8, Directional pattern for falls: Yes Backwards , often occurs when turning.   Living Environment Lives with: lives with their spouse Lives in: House/apartment Stairs: 2 to enter, railing on left during ascend Has following equipment at home: [facility] over commode, walk-in shower with seat and grab bars. Wheelchair for extended distances.   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   Hobbies: Enjoys crafting, reading.   Patient Goals: Build confidence in her balance   OBJECTIVE:   Patient Surveys  ABC: 40%  Cognition Patient is oriented to person, place, and time.  Recent memory is grossly intact however some memory deficit is evident. Remote memory is intact.  Attention span and concentration are intact.  Expressive speech is intact.  Patient's fund of knowledge is within normal limits for educational level.    Gross Musculoskeletal Assessment Tremor: None Bulk: Normal Tone: Normal  Posture: No gross abnormalities noted in standing or seated posture with the exception of some mild forward head.  AROM Deferred  LE MMT: MMT (out of 5) Right  Left   Hip flexion 5 4+  Hip extension    Hip abduction (seated) 4+ 4  Hip adduction 4+ 4  Hip internal rotation    Hip external rotation     Knee flexion (seated) 5 5  Knee extension 5 5  Ankle dorsiflexion 5 5  (* = pain; Blank rows = not tested)  Sensation Deferred  Reflexes Deferred  Cranial Nerves Deferred  Coordination/Cerebellar Finger to Nose: Mild dysmetria LUE Heel to Shin: Deferred Rapid alternating movements: Abnormal LUE Finger Opposition: WNL Pronator Drift: Negative  Bed mobility:Deferred  Transfers: Assistive device utilized: Environmental consultant - 2 wheeled  Sit to stand: Modified independence Stand to sit: Modified independence Chair to chair: Modified independence Floor: Deferred  Curb:  Deferred  Stairs: Level  of Assistance: Modified independence Stair Negotiation Technique: Step to Pattern with Bilateral Rails Number of Stairs: 4  Height of Stairs: 6"  Comments: Pt moves slowly and leans backwards during descent  Gait: Gait pattern: step through pattern, decreased step length- Right, decreased step length- Left, and shuffling Distance walked: 100' Assistive device utilized: Environmental consultant - 2 wheeled Level of assistance: Modified independence Comments: ataxic gait with shuffling steps;  mCTSIB: unable to complete, pt only able to stand for approximately 2-3s in condition 1;  Orthostatic vitals (11/26/23): Supine: BP: 151/66 mmHg, HR: 66 bpm, SpO2: 97% Seated: BP: 151/58 mmHg, HR: 68 bpm, SpO2: 97% Standing (1 minute): BP: 144/60 mmHg, HR: 71 bpm, SpO2: 98% Standing (3 minutes): BP: 149/63 mmHg, HR: 69 bpm, SpO2: 99%   BPPV TESTS (11/26/23):  Symptoms Duration Intensity Nystagmus  L Dix-Hallpike Dizziness/lightheadedness   None  R Dix-Hallpike Dizziness/lightheadedness   None  L Head Roll None   None  R Head Roll None   None  L Sidelying Test      R Sidelying Test      (blank = not tested)  Functional Outcome Measures  Results 11/26/23 Comments  BERG 19/56  High fall risk  DGI  8/24   FGA     TUG 19.3 seconds  Increased fall risk  5TSTS 19.7 seconds  Increased fall risk  6 Minute Walk Test      10 Meter Gait Speed Self-selected: 15.5s = 0.65 m/s; Fastest: 12.5s = 0.8 m/s  Below functional community ambulation speeds  (Blank rows = not tested)   TODAY'S TREATMENT    SUBJECTIVE: Pt reports that she is doing alright today. No falls since the last therapy session. No significant health changes and no specific questions or concern. No specific pain reported upon arrival today.    PAIN: Denies   Therapeutic Activity NuStep L0-4 (seat 7, arms 9) x 10 minutes at start of session for warm-up, BLE strengthening, and interval history. Therapist adjusting resistance to modify challenge;  Forward and side stepping in agility ladder with verbal and visual cues to increase step length x multiple Forward high knee marching in // bars without UE support x multiple lengths; Sit to stand 2 x 10; Seated large amplitude reaching forward, down, up, and out x multiple bous; Seated trunk rotation with lateral large amplitude reaching across body x multiple bouts; Standing large amplitude forward lunges with chest opening reaching x multiple bouts; Balloon taps with therapist and SPT guarding patient 3 x 1 mi Extensive ambulation inside and outside challenging patient on inclines, declines, sideslopes, and ascending/descending curb. Pt struggles whenever she first approaches an uneven or different surface.  Verbal cues to decrease cadence and increase step length. Utilized a metronome with beat at 95 spm to help decrease her cadence;   Not performed: Alternating 6" and 12" step taps x 10 BLE; Seated clams with manual resistance from therapist x 10 BLE Seated adductor squeezes with manual resistance from therapist x 10 BLE; Staggered balance on 6" step eyes open/closed x 30s each BLE; Staggered stance balance eyes open/closed x 30s each BLE; Staggered stance balance horizontal/vertical head turns x 30s each BLE; Resisted gait with double black tband and faded UE support forward and backward x 4  each direction; Standing hip abduction with 3# ankle weights x 10 BLE; Seated LAQ with 3# ankle weights x 10 BLE; bouts each; Lateral 6" step-ups with BUE support x 10 toward each direction; Feet together eyes open/closed x 30s each; Feet  apart horizontal and vertical head turns x 30s each; Standing heel raise holds 3s x 10; Airex feet apart static balance working on knee and hip strategies to maintain balance and prevent posterior LOB; Total Gym L22 double leg squats 2 x 15; TG L22 double leg heel raises 2 x 20; Stair practice with single UE support on rail, cues to move slowly and stabilize prior to taking each additional step;    PATIENT EDUCATION:  Education details: Outcome measures Person educated: Patient Education method: Explanation and Verbal cues Education comprehension: verbalized understanding, returned demonstration, and verbal cues required   HOME EXERCISE PROGRAM:  Access Code: [email or web address] URL: [email or web address] Date: 03/11/2024 Prepared by: Crawford Dock  Exercises - Seated Hip Abduction with Resistance  - 1 x daily - 7 x weekly - 2 sets - 10 reps - 3s hold - Seated March with Resistance  - 1 x daily - 7 x weekly - 2 sets - 10 reps - 3s hold - Seated Hip Adduction Isometrics with Ball  - 1 x daily - 7 x weekly - 2 sets - 10 reps - 3s hold - Seated Long Arc Quad  - 1 x daily - 7 x weekly - 2 sets - 10 reps - 3s hold - Mini Squats with Walker and Chair  - 1 x daily - 7 x weekly - 2 sets - 10 reps   ASSESSMENT:  CLINICAL IMPRESSION: Progressed dynamic gait/balance training with continued focus on longer step lengths during gait. Repeated gait outside across a variety of inclines/declines/curbs. Continued to utilize external verbal and visual cues to improve step length and cadence. Plan to progress balance and strength exercises at future sessions. Pt encouraged to follow-up as scheduled. She will benefit from PT services to address deficits in  strength, balance, and mobility in order to return to full function at home and decrease her risk for falls.  OBJECTIVE IMPAIRMENTS: decreased balance and decreased strength.   ACTIVITY LIMITATIONS: standing, squatting, stairs, and transfers  PARTICIPATION LIMITATIONS: meal prep, cleaning, laundry, shopping, and community activity  PERSONAL FACTORS: Age, Past/current experiences, Time since onset of injury/illness/exacerbation, and 3+ comorbidities: memory change, fatigue, and NPH are also affecting patient's functional outcome.   REHAB POTENTIAL: Fair    CLINICAL DECISION MAKING: Unstable/unpredictable  EVALUATION COMPLEXITY: High   GOALS: Goals reviewed with patient? Yes  SHORT TERM GOALS: Target date: 04/01/24  Pt will be independent with HEP in order to improve strength and balance in order to decrease fall risk and improve function at home. Baseline: HEP provided  Goal status: IN progress: Not met ( Spouse reported Non Compliance)   LONG TERM GOALS: Target date: 04/22/2024   1.  Pt will improve BERG by at least 3 points in order to demonstrate clinically significant improvement in balance.   Baseline: 19/56; 01/15/24: 19/56; 20/56 on 5/1; 03/11/24: 19/56; Goal status: ONGOING  2.  Pt will improve ABC by at least 13% in order to demonstrate clinically significant improvement in balance confidence.      Baseline: 40%, 01/15/24: 65%; 03/11/24: 60.6%; Goal status: ACHIEVED  3. Pt will decrease 5TSTS by at least 3 seconds in order to demonstrate clinically significant improvement in LE strength      Baseline: 19.7s, 01/15/24: 15.9s; 03/11/24: 17.3s Goal status: PARTIALLY MET  4. Pt will decrease TUG to below 14 seconds/decrease in order to demonstrate decreased fall risk.  Baseline: 19.3s, 01/15/24: 20.7s; 5/1 24.4secs 03/11/24: 17.2s   Goal status: PARTIALLY MET   PLAN: PT FREQUENCY:  1-2x/week  PT DURATION: 6 weeks  PLANNED INTERVENTIONS: Therapeutic exercises, Therapeutic  activity, Neuromuscular re-education, Balance training, Gait training, Patient/Family education, Self Care, Joint mobilization, Joint manipulation, Vestibular training, Canalith repositioning, Orthotic/Fit training, DME instructions, Dry Needling, Electrical stimulation, Spinal manipulation, Spinal mobilization, Cryotherapy, Moist heat, Taping, Traction, Ultrasound, Ionotophoresis 4mg /ml Dexamethasone, Manual therapy, and Re-evaluation.  PLAN FOR NEXT SESSION: progress balance and strength exercises, review/modify HEP as needed;   Sakai Wolford D Laila Myhre PT, DPT, GCS  Adonis Yim 03/26/2024, 11:11 AM

## 2024-04-03 ENCOUNTER — Ambulatory Visit

## 2024-04-03 DIAGNOSIS — R262 Difficulty in walking, not elsewhere classified: Secondary | ICD-10-CM | POA: Diagnosis not present

## 2024-04-03 DIAGNOSIS — M6281 Muscle weakness (generalized): Secondary | ICD-10-CM

## 2024-04-03 DIAGNOSIS — R2681 Unsteadiness on feet: Secondary | ICD-10-CM

## 2024-04-03 NOTE — Therapy (Signed)
 OUTPATIENT PHYSICAL THERAPY BALANCE TREATMENT   Patient Name: Crystal Lynch MRN: [id number] DOB:September 23, 1941, 83 y.o., female Today's Date: 04/03/2024  END OF SESSION:  PT End of Session - 04/03/24 1444     Visit Number 26    Number of Visits 45    Date for PT Re-Evaluation 04/22/24    Authorization Type eval: 11/19/23    PT Start Time 1450    PT Stop Time 1545    PT Time Calculation (min) 55 min    Equipment Utilized During Treatment Gait belt    Activity Tolerance Patient tolerated treatment well    Behavior During Therapy WFL for tasks assessed/performed         Past Medical History:  Diagnosis Date   Arthritis    Chicken pox    Endometriosis    Esophagitis    s/p esophageal dilatation   GERD (gastroesophageal reflux disease)    History of ovarian cyst    Hypercholesterolemia    Hypertension    Hypothyroidism    Past Surgical History:  Procedure Laterality Date   ABDOMINAL SURGERY     found to have an ovarian cyst and endometriosis   APPENDECTOMY     Patient Active Problem List   Diagnosis Date Noted   Low back pain 12/10/2023   Postnasal drip 12/10/2023   Bursitis of right hip 09/12/2023   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   Stress 07/27/2022   Unsteady gait 07/10/2022   Hypophosphatemia 06/11/2022   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   OSA (obstructive sleep apnea) 06/09/2022   Kidney lesion 12/14/2021   Left hip pain 12/13/2021   Memory change 11/27/2021   Fatigue 07/23/2021   Aortic atherosclerosis (HCC) 04/17/2021   Right acute serous otitis media    Hyponatremia 02/23/2021   Prolonged QT interval 02/23/2021   Dizziness 09/12/2020   Fall 03/08/2020   Fatty liver 12/28/2019   Chest pain 07/25/2019   Frequent urinary tract infections 12/24/2018   UTI (urinary tract infection) 10/11/2018   Hypercalcemia 01/09/2017   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   Sleeping difficulties 03/08/2015   Health care maintenance 01/10/2015    Osteopenia 08/10/2014   Hyperbilirubinemia 10/27/2012   Hematuria 10/27/2012   Hypertension 10/21/2012   Hypercholesterolemia 10/21/2012   Hypothyroidism 10/21/2012   Esophagitis 10/21/2012    PCP: Dellar Fenton, MD  REFERRING PROVIDER: Dellar Fenton, MD   REFERRING DIAG: R26.81 (ICD-10-CM) - Unsteady gait   RATIONALE FOR EVALUATION AND TREATMENT: Rehabilitation  THERAPY DIAG: Unsteadiness on feet  Difficulty in walking, not elsewhere classified  Muscle weakness (generalized)  ONSET DATE: Chronic x years  FOLLOW-UP APPT SCHEDULED WITH REFERRING PROVIDER: Yes   FROM INITIAL EVALUATION SUBJECTIVE:  SUBJECTIVE STATEMENT:  Unsteadiness  PERTINENT HISTORY:  Pt referred to PT for unsteadiness and dizziness. History today obtained from both patient and husband. I'm dizzy all the time. She denies vertigo but reports persistent unsteadiness with occasional lightheadedness. Pt has a history of NPH. She underwent a shunt trial at [facility] but there was not enough change in her testing to merit a permanent shunt placement. She is currently being followed by [facility] neurology and has a follow-up appointment this Wednesday. 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 She had a alpha-synuclein skin biopsy, EMG/NCS, echo, and CTA head and neck. 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 Recommended baby aspirin  and CTA 6 months. 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 No evidence of motor neuron disease.  Pt reports shuffling gait and freezing episodes but no tremor. She was started on carbidopa-levodopa but has not noticed any change in her symptoms since starting. She uses a rollator at home and front wheeled walker when out in the community. Pt wears eyeglasses all the time  and has an upcoming check-up with optometry. History of occasional sinus headaches but no history of migraines. Pt reports some L hip weakness.  Pain: Yes, L hip pain Numbness/Tingling: No Focal Weakness: Yes, L hip weakness related to pain; Recent changes in overall health/medication: Yes, carvidopa/levodopa Prior history of physical therapy for balance:  Yes Dominant hand: right Red flags: no personal history of cancer, abdominal pain, chills/fever, night sweats, nausea, vomiting, unrelenting pain  PRECAUTIONS: None  WEIGHT BEARING RESTRICTIONS: No  FALLS: Has patient fallen in last 6 months? Yes. Number of falls 7-8, Directional pattern for falls: Yes Backwards , often occurs when turning.   Living Environment Lives with: lives with their spouse Lives in: House/apartment Stairs: 2 to enter, railing on left during ascend Has following equipment at home: [facility] over commode, walk-in shower with seat and grab bars. Wheelchair for extended distances.   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   Hobbies: Enjoys crafting, reading.   Patient Goals: Build confidence in her balance   OBJECTIVE:   Patient Surveys  ABC: 40%  Cognition Patient is oriented to person, place, and time.  Recent memory is grossly intact however some memory deficit is evident. Remote memory is intact.  Attention span and concentration are intact.  Expressive speech is intact.  Patient's fund of knowledge is within normal limits for educational level.    Gross Musculoskeletal Assessment Tremor: None Bulk: Normal Tone: Normal  Posture: No gross abnormalities noted in standing or seated posture with the exception of some mild forward head.  AROM Deferred  LE MMT: MMT (out of 5) Right  Left   Hip flexion 5 4+  Hip extension    Hip abduction (seated) 4+ 4  Hip adduction 4+ 4  Hip internal rotation    Hip  external rotation    Knee flexion (seated) 5 5  Knee extension 5 5  Ankle dorsiflexion 5 5  (* = pain; Blank rows = not tested)  Sensation Deferred  Reflexes Deferred  Cranial Nerves Deferred  Coordination/Cerebellar Finger to Nose: Mild dysmetria LUE Heel to Shin: Deferred Rapid alternating movements: Abnormal LUE Finger Opposition: WNL Pronator Drift: Negative  Bed mobility:Deferred  Transfers: Assistive device utilized: Environmental consultant - 2 wheeled  Sit to stand: Modified independence Stand to sit: Modified independence Chair to chair: Modified independence Floor: Deferred  Curb:  Deferred  Stairs: Level  of Assistance: Modified independence Stair Negotiation Technique: Step to Pattern with Bilateral Rails Number of Stairs: 4  Height of Stairs: 6  Comments: Pt moves slowly and leans backwards during descent  Gait: Gait pattern: step through pattern, decreased step length- Right, decreased step length- Left, and shuffling Distance walked: 100' Assistive device utilized: Environmental consultant - 2 wheeled Level of assistance: Modified independence Comments: ataxic gait with shuffling steps;  mCTSIB: unable to complete, pt only able to stand for approximately 2-3s in condition 1;  Orthostatic vitals (11/26/23): Supine: BP: 151/66 mmHg, HR: 66 bpm, SpO2: 97% Seated: BP: 151/58 mmHg, HR: 68 bpm, SpO2: 97% Standing (1 minute): BP: 144/60 mmHg, HR: 71 bpm, SpO2: 98% Standing (3 minutes): BP: 149/63 mmHg, HR: 69 bpm, SpO2: 99%   BPPV TESTS (11/26/23):  Symptoms Duration Intensity Nystagmus  L Dix-Hallpike Dizziness/lightheadedness   None  R Dix-Hallpike Dizziness/lightheadedness   None  L Head Roll None   None  R Head Roll None   None  L Sidelying Test      R Sidelying Test      (blank = not tested)  Functional Outcome Measures  Results 11/26/23 Comments  BERG 19/56  High fall risk  DGI  8/24   FGA     TUG 19.3 seconds  Increased fall risk  5TSTS 19.7 seconds  Increased fall risk   6 Minute Walk Test     10 Meter Gait Speed Self-selected: 15.5s = 0.65 m/s; Fastest: 12.5s = 0.8 m/s  Below functional community ambulation speeds  (Blank rows = not tested)   TODAY'S TREATMENT    SUBJECTIVE: Pt reports that she is doing alright today. No falls since the last therapy session. No significant health changes and no specific questions or concern. No specific pain reported upon arrival today.    PAIN: Denies   Therapeutic Activity NuStep L0-4 (seat 8, arms 9) x 10 minutes (5 mins unbilled) at start of session for warm-up, BLE strengthening, and interval history. Therapist adjusting resistance to modify challenge;  Forward and side stepping in agility ladder with verbal and visual cues to increase step length x multiple Forward high knee marching in // bars without UE support x multiple lengths; Sit to stand 2 x 10; Seated large amplitude reaching forward, down, up, and out x multiple bouts; Seated trunk rotation with lateral large amplitude reaching across body x multiple bouts; Standing large amplitude forward lunges with chest opening reaching x multiple bouts; Balloon taps with therapist and SPT guarding patient 3 x 1 min Extensive ambulation inside and outside challenging patient on inclines, declines, sideslopes, and ascending/descending curb. Pt struggles whenever she first approaches an uneven or different surface.  Verbal cues to decrease cadence and increase step length. Utilized a metronome with beat at 95 spm to help decrease her cadence;   Not performed: Alternating 6 and 12 step taps x 10 BLE; Seated clams with manual resistance from therapist x 10 BLE Seated adductor squeezes with manual resistance from therapist x 10 BLE; Staggered balance on 6 step eyes open/closed x 30s each BLE; Staggered stance balance eyes open/closed x 30s each BLE; Staggered stance balance horizontal/vertical head turns x 30s each BLE; Resisted gait with double black tband and  faded UE support forward and backward x 4 each direction; Standing hip abduction with 3# ankle weights x 10 BLE; Seated LAQ with 3# ankle weights x 10 BLE; bouts each; Lateral 6 step-ups with BUE support x 10 toward each direction; Feet together eyes open/closed x  30s each; Feet apart horizontal and vertical head turns x 30s each; Standing heel raise holds 3s x 10; Airex feet apart static balance working on knee and hip strategies to maintain balance and prevent posterior LOB; Total Gym L22 double leg squats 2 x 15; TG L22 double leg heel raises 2 x 20; Stair practice with single UE support on rail, cues to move slowly and stabilize prior to taking each additional step;    PATIENT EDUCATION:  Education details: Outcome measures Person educated: Patient Education method: Explanation and Verbal cues Education comprehension: verbalized understanding, returned demonstration, and verbal cues required   HOME EXERCISE PROGRAM:  Access Code: [email or web address] URL: [email or web address] Date: 03/11/2024 Prepared by: Crawford Dock  Exercises - Seated Hip Abduction with Resistance  - 1 x daily - 7 x weekly - 2 sets - 10 reps - 3s hold - Seated March with Resistance  - 1 x daily - 7 x weekly - 2 sets - 10 reps - 3s hold - Seated Hip Adduction Isometrics with Ball  - 1 x daily - 7 x weekly - 2 sets - 10 reps - 3s hold - Seated Long Arc Quad  - 1 x daily - 7 x weekly - 2 sets - 10 reps - 3s hold - Mini Squats with Walker and Chair  - 1 x daily - 7 x weekly - 2 sets - 10 reps   ASSESSMENT:  CLINICAL IMPRESSION: Repeated dynamic gait/balance training with continued focus on longer step lengths during gait. Pt struggled with gait activities this session noted by several LOB and frequent retropulsion. Repeated gait outside across a variety of inclines/declines/curbs. Continued to utilize external verbal and visual cues to improve step length and cadence. Plan to progress balance and  strength exercises at future sessions. Pt encouraged to follow-up as scheduled. She will benefit from PT services to address deficits in strength, balance, and mobility in order to return to full function at home and decrease her risk for falls.  OBJECTIVE IMPAIRMENTS: decreased balance and decreased strength.   ACTIVITY LIMITATIONS: standing, squatting, stairs, and transfers  PARTICIPATION LIMITATIONS: meal prep, cleaning, laundry, shopping, and community activity  PERSONAL FACTORS: Age, Past/current experiences, Time since onset of injury/illness/exacerbation, and 3+ comorbidities: memory change, fatigue, and NPH are also affecting patient's functional outcome.   REHAB POTENTIAL: Fair    CLINICAL DECISION MAKING: Unstable/unpredictable  EVALUATION COMPLEXITY: High   GOALS: Goals reviewed with patient? Yes  SHORT TERM GOALS: Target date: 04/01/24  Pt will be independent with HEP in order to improve strength and balance in order to decrease fall risk and improve function at home. Baseline: HEP provided  Goal status: IN progress: Not met ( Spouse reported Non Compliance)   LONG TERM GOALS: Target date: 04/22/2024   1.  Pt will improve BERG by at least 3 points in order to demonstrate clinically significant improvement in balance.   Baseline: 19/56; 01/15/24: 19/56; 20/56 on 5/1; 03/11/24: 19/56; Goal status: ONGOING  2.  Pt will improve ABC by at least 13% in order to demonstrate clinically significant improvement in balance confidence.      Baseline: 40%, 01/15/24: 65%; 03/11/24: 60.6%; Goal status: ACHIEVED  3. Pt will decrease 5TSTS by at least 3 seconds in order to demonstrate clinically significant improvement in LE strength      Baseline: 19.7s, 01/15/24: 15.9s; 03/11/24: 17.3s Goal status: PARTIALLY MET  4. Pt will decrease TUG to below 14 seconds/decrease in order to demonstrate decreased fall risk.  Baseline: 19.3s,  01/15/24: 20.7s; 5/1 24.4secs 03/11/24: 17.2s   Goal status:  PARTIALLY MET   PLAN: PT FREQUENCY: 1-2x/week  PT DURATION: 6 weeks  PLANNED INTERVENTIONS: Therapeutic exercises, Therapeutic activity, Neuromuscular re-education, Balance training, Gait training, Patient/Family education, Self Care, Joint mobilization, Joint manipulation, Vestibular training, Canalith repositioning, Orthotic/Fit training, DME instructions, Dry Needling, Electrical stimulation, Spinal manipulation, Spinal mobilization, Cryotherapy, Moist heat, Taping, Traction, Ultrasound, Ionotophoresis 4mg /ml Dexamethasone, Manual therapy, and Re-evaluation.  PLAN FOR NEXT SESSION: progress balance and strength exercises, review/modify HEP as needed;   Sheril Dines, SPT  Jason D Huprich PT, DPT, GCS  Huprich,Jason 04/03/2024, 4:58 PM

## 2024-04-10 ENCOUNTER — Ambulatory Visit

## 2024-04-10 DIAGNOSIS — R2681 Unsteadiness on feet: Secondary | ICD-10-CM

## 2024-04-10 DIAGNOSIS — R2689 Other abnormalities of gait and mobility: Secondary | ICD-10-CM

## 2024-04-10 DIAGNOSIS — R262 Difficulty in walking, not elsewhere classified: Secondary | ICD-10-CM | POA: Diagnosis not present

## 2024-04-10 DIAGNOSIS — R278 Other lack of coordination: Secondary | ICD-10-CM

## 2024-04-10 NOTE — Therapy (Signed)
 OUTPATIENT PHYSICAL THERAPY BALANCE TREATMENT   Patient Name: Crystal Lynch MRN: [id number] DOB:1941/02/17, 83 y.o., female Today's Date: 04/11/2024  END OF SESSION:  PT End of Session - 04/10/24 1108     Visit Number 27    Number of Visits 45    Date for PT Re-Evaluation 04/22/24    Authorization Type eval: 11/19/23    PT Start Time 1105    PT Stop Time 1148    PT Time Calculation (min) 43 min    Equipment Utilized During Treatment Gait belt    Activity Tolerance Patient tolerated treatment well    Behavior During Therapy WFL for tasks assessed/performed         Past Medical History:  Diagnosis Date   Arthritis    Chicken pox    Endometriosis    Esophagitis    s/p esophageal dilatation   GERD (gastroesophageal reflux disease)    History of ovarian cyst    Hypercholesterolemia    Hypertension    Hypothyroidism    Past Surgical History:  Procedure Laterality Date   ABDOMINAL SURGERY     found to have an ovarian cyst and endometriosis   APPENDECTOMY     Patient Active Problem List   Diagnosis Date Noted   Low back pain 12/10/2023   Postnasal drip 12/10/2023   Bursitis of right hip 09/12/2023   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   Stress 07/27/2022   Unsteady gait 07/10/2022   Hypophosphatemia 06/11/2022   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   OSA (obstructive sleep apnea) 06/09/2022   Kidney lesion 12/14/2021   Left hip pain 12/13/2021   Memory change 11/27/2021   Fatigue 07/23/2021   Aortic atherosclerosis (HCC) 04/17/2021   Right acute serous otitis media    Hyponatremia 02/23/2021   Prolonged QT interval 02/23/2021   Dizziness 09/12/2020   Fall 03/08/2020   Fatty liver 12/28/2019   Chest pain 07/25/2019   Frequent urinary tract infections 12/24/2018   UTI (urinary tract infection) 10/11/2018   Hypercalcemia 01/09/2017   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   Sleeping difficulties 03/08/2015   Health care maintenance 01/10/2015    Osteopenia 08/10/2014   Hyperbilirubinemia 10/27/2012   Hematuria 10/27/2012   Hypertension 10/21/2012   Hypercholesterolemia 10/21/2012   Hypothyroidism 10/21/2012   Esophagitis 10/21/2012    PCP: Dellar Fenton, MD  REFERRING PROVIDER: Dellar Fenton, MD   REFERRING DIAG: R26.81 (ICD-10-CM) - Unsteady gait   RATIONALE FOR EVALUATION AND TREATMENT: Rehabilitation  THERAPY DIAG: Unsteadiness on feet  Difficulty in walking, not elsewhere classified  Other abnormalities of gait and mobility  Other lack of coordination  ONSET DATE: Chronic x years  FOLLOW-UP APPT SCHEDULED WITH REFERRING PROVIDER: Yes   FROM INITIAL EVALUATION SUBJECTIVE:  SUBJECTIVE STATEMENT:  Unsteadiness  PERTINENT HISTORY:  Pt referred to PT for unsteadiness and dizziness. History today obtained from both patient and husband. I'm dizzy all the time. She denies vertigo but reports persistent unsteadiness with occasional lightheadedness. Pt has a history of NPH. She underwent a shunt trial at [facility] but there was not enough change in her testing to merit a permanent shunt placement. She is currently being followed by [facility] neurology and has a follow-up appointment this Wednesday. 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 She had a alpha-synuclein skin biopsy, EMG/NCS, echo, and CTA head and neck. 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 Recommended baby aspirin  and CTA 6 months. 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 No evidence of motor neuron disease.  Pt reports shuffling gait and freezing episodes but no tremor. She was started on carbidopa-levodopa but has not noticed any change in her symptoms since starting. She uses a rollator at home and front wheeled walker when out in the  community. Pt wears eyeglasses all the time and has an upcoming check-up with optometry. History of occasional sinus headaches but no history of migraines. Pt reports some L hip weakness.  Pain: Yes, L hip pain Numbness/Tingling: No Focal Weakness: Yes, L hip weakness related to pain; Recent changes in overall health/medication: Yes, carvidopa/levodopa Prior history of physical therapy for balance:  Yes Dominant hand: right Red flags: no personal history of cancer, abdominal pain, chills/fever, night sweats, nausea, vomiting, unrelenting pain  PRECAUTIONS: None  WEIGHT BEARING RESTRICTIONS: No  FALLS: Has patient fallen in last 6 months? Yes. Number of falls 7-8, Directional pattern for falls: Yes Backwards , often occurs when turning.   Living Environment Lives with: lives with their spouse Lives in: House/apartment Stairs: 2 to enter, railing on left during ascend Has following equipment at home: [facility] over commode, walk-in shower with seat and grab bars. Wheelchair for extended distances.   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   Hobbies: Enjoys crafting, reading.   Patient Goals: Build confidence in her balance   OBJECTIVE:   Patient Surveys  ABC: 40%  Cognition Patient is oriented to person, place, and time.  Recent memory is grossly intact however some memory deficit is evident. Remote memory is intact.  Attention span and concentration are intact.  Expressive speech is intact.  Patient's fund of knowledge is within normal limits for educational level.    Gross Musculoskeletal Assessment Tremor: None Bulk: Normal Tone: Normal  Posture: No gross abnormalities noted in standing or seated posture with the exception of some mild forward head.  AROM Deferred  LE MMT: MMT (out of 5) Right  Left   Hip flexion 5 4+  Hip extension    Hip abduction (seated) 4+ 4  Hip  adduction 4+ 4  Hip internal rotation    Hip external rotation    Knee flexion (seated) 5 5  Knee extension 5 5  Ankle dorsiflexion 5 5  (* = pain; Blank rows = not tested)  Sensation Deferred  Reflexes Deferred  Cranial Nerves Deferred  Coordination/Cerebellar Finger to Nose: Mild dysmetria LUE Heel to Shin: Deferred Rapid alternating movements: Abnormal LUE Finger Opposition: WNL Pronator Drift: Negative  Bed mobility:Deferred  Transfers: Assistive device utilized: Environmental consultant - 2 wheeled  Sit to stand: Modified independence Stand to sit: Modified independence Chair to chair: Modified independence Floor: Deferred  Curb:  Deferred  Stairs: Level  of Assistance: Modified independence Stair Negotiation Technique: Step to Pattern with Bilateral Rails Number of Stairs: 4  Height of Stairs: 6  Comments: Pt moves slowly and leans backwards during descent  Gait: Gait pattern: step through pattern, decreased step length- Right, decreased step length- Left, and shuffling Distance walked: 100' Assistive device utilized: Environmental consultant - 2 wheeled Level of assistance: Modified independence Comments: ataxic gait with shuffling steps;  mCTSIB: unable to complete, pt only able to stand for approximately 2-3s in condition 1;  Orthostatic vitals (11/26/23): Supine: BP: 151/66 mmHg, HR: 66 bpm, SpO2: 97% Seated: BP: 151/58 mmHg, HR: 68 bpm, SpO2: 97% Standing (1 minute): BP: 144/60 mmHg, HR: 71 bpm, SpO2: 98% Standing (3 minutes): BP: 149/63 mmHg, HR: 69 bpm, SpO2: 99%   BPPV TESTS (11/26/23):  Symptoms Duration Intensity Nystagmus  L Dix-Hallpike Dizziness/lightheadedness   None  R Dix-Hallpike Dizziness/lightheadedness   None  L Head Roll None   None  R Head Roll None   None  L Sidelying Test      R Sidelying Test      (blank = not tested)  Functional Outcome Measures  Results 11/26/23 Comments  BERG 19/56  High fall risk  DGI  8/24   FGA     TUG 19.3 seconds  Increased fall  risk  5TSTS 19.7 seconds  Increased fall risk  6 Minute Walk Test     10 Meter Gait Speed Self-selected: 15.5s = 0.65 m/s; Fastest: 12.5s = 0.8 m/s  Below functional community ambulation speeds  (Blank rows = not tested)   TODAY'S TREATMENT    SUBJECTIVE: Pt reports that she is doing alright today. Pt reports she has decreased energy during the summer, especially when the temperature is really high. No falls since the last therapy session. No significant health changes and no specific questions or concern. No specific pain reported upon arrival today.    PAIN: Denies   Therapeutic Activity NuStep L0-4 (seat 8, arms 9) x 10 minutes (4 mins unbilled) at start of session for warm-up, BLE strengthening, and interval history. Therapist adjusting resistance to modify challenge;  Forward and side stepping in agility ladder with verbal and visual cues to increase step length x multiple Forward high knee marching in // bars without UE support x multiple lengths; Sit to stand 2 x 10; Extensive ambulation inside and outside challenging patient on inclines, declines, sideslopes, and ascending/descending curb. Pt struggles whenever she first approaches an uneven or different surface.  Verbal cues to decrease cadence and increase step length. Utilized a metronome with beat at 95 spm to help decrease her cadence;   Neuromuscular Re-ed Seated large amplitude reaching forward, down, up, and out x multiple bouts; Seated trunk rotation with lateral large amplitude reaching across body x multiple bouts; Standing large amplitude forward lunges with chest opening reaching x multiple bouts; Balloon taps with therapist and SPT guarding patient 2 x 1 min;   Not performed: Alternating 6 and 12 step taps x 10 BLE; Seated clams with manual resistance from therapist x 10 BLE Seated adductor squeezes with manual resistance from therapist x 10 BLE; Staggered balance on 6 step eyes open/closed x 30s each  BLE; Staggered stance balance eyes open/closed x 30s each BLE; Staggered stance balance horizontal/vertical head turns x 30s each BLE; Resisted gait with double black tband and faded UE support forward and backward x 4 each direction; Standing hip abduction with 3# ankle weights x 10 BLE; Seated LAQ with 3# ankle weights x  10 BLE; bouts each; Lateral 6 step-ups with BUE support x 10 toward each direction; Feet together eyes open/closed x 30s each; Feet apart horizontal and vertical head turns x 30s each; Standing heel raise holds 3s x 10; Airex feet apart static balance working on knee and hip strategies to maintain balance and prevent posterior LOB; Total Gym L22 double leg squats 2 x 15; TG L22 double leg heel raises 2 x 20; Stair practice with single UE support on rail, cues to move slowly and stabilize prior to taking each additional step;    PATIENT EDUCATION:  Education details: Outcome measures Person educated: Patient Education method: Explanation and Verbal cues Education comprehension: verbalized understanding, returned demonstration, and verbal cues required   HOME EXERCISE PROGRAM:  Access Code: [email or web address] URL: [email or web address] Date: 03/11/2024 Prepared by: Crawford Dock  Exercises - Seated Hip Abduction with Resistance  - 1 x daily - 7 x weekly - 2 sets - 10 reps - 3s hold - Seated March with Resistance  - 1 x daily - 7 x weekly - 2 sets - 10 reps - 3s hold - Seated Hip Adduction Isometrics with Ball  - 1 x daily - 7 x weekly - 2 sets - 10 reps - 3s hold - Seated Long Arc Quad  - 1 x daily - 7 x weekly - 2 sets - 10 reps - 3s hold - Mini Squats with Walker and Chair  - 1 x daily - 7 x weekly - 2 sets - 10 reps   ASSESSMENT:  CLINICAL IMPRESSION: Repeated dynamic gait/balance training with continued focus on longer step lengths during gait. Pt still struggled with gait activities with several LOB but retropulsion was less frequent this session.  Pt was able to self-correct her balance more this session by shifting her weight forwards, evidenced by less guarding assist needed by SPT. Pt still struggles with ladder side-stepping and will default to rapid, festinating pattern.  Repeated gait outside across a variety of inclines/declines/curbs. Continued to utilize external verbal and visual cues to improve step length and cadence. Plan to progress balance and strength exercises at future sessions. Pt encouraged to follow-up as scheduled. She will benefit from PT services to address deficits in strength, balance, and mobility in order to return to full function at home and decrease her risk for falls.  OBJECTIVE IMPAIRMENTS: decreased balance and decreased strength.   ACTIVITY LIMITATIONS: standing, squatting, stairs, and transfers  PARTICIPATION LIMITATIONS: meal prep, cleaning, laundry, shopping, and community activity  PERSONAL FACTORS: Age, Past/current experiences, Time since onset of injury/illness/exacerbation, and 3+ comorbidities: memory change, fatigue, and NPH are also affecting patient's functional outcome.   REHAB POTENTIAL: Fair    CLINICAL DECISION MAKING: Unstable/unpredictable  EVALUATION COMPLEXITY: High   GOALS: Goals reviewed with patient? Yes  SHORT TERM GOALS: Target date: 04/01/24  Pt will be independent with HEP in order to improve strength and balance in order to decrease fall risk and improve function at home. Baseline: HEP provided  Goal status: IN progress: Not met ( Spouse reported Non Compliance)   LONG TERM GOALS: Target date: 04/22/2024   1.  Pt will improve BERG by at least 3 points in order to demonstrate clinically significant improvement in balance.   Baseline: 19/56; 01/15/24: 19/56; 20/56 on 5/1; 03/11/24: 19/56; Goal status: ONGOING  2.  Pt will improve ABC by at least 13% in order to demonstrate clinically significant improvement in balance confidence.      Baseline: 40%, 01/15/24: 65%;  03/11/24:  60.6%; Goal status: ACHIEVED  3. Pt will decrease 5TSTS by at least 3 seconds in order to demonstrate clinically significant improvement in LE strength      Baseline: 19.7s, 01/15/24: 15.9s; 03/11/24: 17.3s Goal status: PARTIALLY MET  4. Pt will decrease TUG to below 14 seconds/decrease in order to demonstrate decreased fall risk.  Baseline: 19.3s, 01/15/24: 20.7s; 5/1 24.4secs 03/11/24: 17.2s   Goal status: PARTIALLY MET   PLAN: PT FREQUENCY: 1-2x/week  PT DURATION: 6 weeks  PLANNED INTERVENTIONS: Therapeutic exercises, Therapeutic activity, Neuromuscular re-education, Balance training, Gait training, Patient/Family education, Self Care, Joint mobilization, Joint manipulation, Vestibular training, Canalith repositioning, Orthotic/Fit training, DME instructions, Dry Needling, Electrical stimulation, Spinal manipulation, Spinal mobilization, Cryotherapy, Moist heat, Taping, Traction, Ultrasound, Ionotophoresis 4mg /ml Dexamethasone, Manual therapy, and Re-evaluation.  PLAN FOR NEXT SESSION: progress balance and strength exercises, review/modify HEP as needed;   Sheril Dines, SPT Jason D Huprich PT, DPT, GCS  Huprich,Jason 04/11/2024, 9:15 AM

## 2024-04-17 ENCOUNTER — Ambulatory Visit

## 2024-04-17 DIAGNOSIS — R2681 Unsteadiness on feet: Secondary | ICD-10-CM

## 2024-04-17 DIAGNOSIS — M6281 Muscle weakness (generalized): Secondary | ICD-10-CM

## 2024-04-17 DIAGNOSIS — R262 Difficulty in walking, not elsewhere classified: Secondary | ICD-10-CM | POA: Diagnosis not present

## 2024-04-17 NOTE — Therapy (Signed)
 OUTPATIENT PHYSICAL THERAPY BALANCE TREATMENT/RECERTIFICATION   Patient Name: Crystal Lynch MRN: [id number] DOB:1941-05-10, 83 y.o., female Today's Date: 04/18/2024  END OF SESSION:  PT End of Session - 04/17/24 0857     Visit Number 28    Number of Visits 57    Date for PT Re-Evaluation 05/30/24    Authorization Type eval: 11/19/23    PT Start Time 0900    PT Stop Time 0930    PT Time Calculation (min) 30 min    Equipment Utilized During Treatment Gait belt    Activity Tolerance Patient tolerated treatment well    Behavior During Therapy WFL for tasks assessed/performed          Past Medical History:  Diagnosis Date   Arthritis    Chicken pox    Endometriosis    Esophagitis    s/p esophageal dilatation   GERD (gastroesophageal reflux disease)    History of ovarian cyst    Hypercholesterolemia    Hypertension    Hypothyroidism    Past Surgical History:  Procedure Laterality Date   ABDOMINAL SURGERY     found to have an ovarian cyst and endometriosis   APPENDECTOMY     Patient Active Problem List   Diagnosis Date Noted   Low back pain 12/10/2023   Postnasal drip 12/10/2023   Bursitis of right hip 09/12/2023   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   Stress 07/27/2022   Unsteady gait 07/10/2022   Hypophosphatemia 06/11/2022   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   OSA (obstructive sleep apnea) 06/09/2022   Kidney lesion 12/14/2021   Left hip pain 12/13/2021   Memory change 11/27/2021   Fatigue 07/23/2021   Aortic atherosclerosis (HCC) 04/17/2021   Right acute serous otitis media    Hyponatremia 02/23/2021   Prolonged QT interval 02/23/2021   Dizziness 09/12/2020   Fall 03/08/2020   Fatty liver 12/28/2019   Chest pain 07/25/2019   Frequent urinary tract infections 12/24/2018   UTI (urinary tract infection) 10/11/2018   Hypercalcemia 01/09/2017   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   Sleeping difficulties 03/08/2015   Health care maintenance  01/10/2015   Osteopenia 08/10/2014   Hyperbilirubinemia 10/27/2012   Hematuria 10/27/2012   Hypertension 10/21/2012   Hypercholesterolemia 10/21/2012   Hypothyroidism 10/21/2012   Esophagitis 10/21/2012    PCP: Glendia Shad, MD  REFERRING PROVIDER: Glendia Shad, MD   REFERRING DIAG: R26.81 (ICD-10-CM) - Unsteady gait   RATIONALE FOR EVALUATION AND TREATMENT: Rehabilitation  THERAPY DIAG: Unsteadiness on feet  Muscle weakness (generalized)  ONSET DATE: Chronic x years  FOLLOW-UP APPT SCHEDULED WITH REFERRING PROVIDER: Yes   FROM INITIAL EVALUATION SUBJECTIVE:  SUBJECTIVE STATEMENT:  Unsteadiness  PERTINENT HISTORY:  Pt referred to PT for unsteadiness and dizziness. History today obtained from both patient and husband. I'm dizzy all the time. She denies vertigo but reports persistent unsteadiness with occasional lightheadedness. Pt has a history of NPH. She underwent a shunt trial at [facility] but there was not enough change in her testing to merit a permanent shunt placement. She is currently being followed by [facility] neurology and has a follow-up appointment this Wednesday. 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 She had a alpha-synuclein skin biopsy, EMG/NCS, echo, and CTA head and neck. 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 Recommended baby aspirin  and CTA 6 months. 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 No evidence of motor neuron disease.  Pt reports shuffling gait and freezing episodes but no tremor. She was started on carbidopa-levodopa but has not noticed any change in her symptoms since starting. She uses a rollator at home and front wheeled walker when out in the community. Pt wears eyeglasses all the time and has an upcoming check-up with  optometry. History of occasional sinus headaches but no history of migraines. Pt reports some L hip weakness.  Pain: Yes, L hip pain Numbness/Tingling: No Focal Weakness: Yes, L hip weakness related to pain; Recent changes in overall health/medication: Yes, carvidopa/levodopa Prior history of physical therapy for balance:  Yes Dominant hand: right Red flags: no personal history of cancer, abdominal pain, chills/fever, night sweats, nausea, vomiting, unrelenting pain  PRECAUTIONS: None  WEIGHT BEARING RESTRICTIONS: No  FALLS: Has patient fallen in last 6 months? Yes. Number of falls 7-8, Directional pattern for falls: Yes Backwards , often occurs when turning.   Living Environment Lives with: lives with their spouse Lives in: House/apartment Stairs: 2 to enter, railing on left during ascend Has following equipment at home: [employer] over commode, walk-in shower with seat and grab bars. Wheelchair for extended distances.   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   Hobbies: Enjoys crafting, reading.   Patient Goals: Build confidence in her balance   OBJECTIVE:   Patient Surveys  ABC: 40%  Cognition Patient is oriented to person, place, and time.  Recent memory is grossly intact however some memory deficit is evident. Remote memory is intact.  Attention span and concentration are intact.  Expressive speech is intact.  Patient's fund of knowledge is within normal limits for educational level.    Gross Musculoskeletal Assessment Tremor: None Bulk: Normal Tone: Normal  Posture: No gross abnormalities noted in standing or seated posture with the exception of some mild forward head.  AROM Deferred  LE MMT: MMT (out of 5) Right  Left   Hip flexion 5 4+  Hip extension    Hip abduction (seated) 4+ 4  Hip adduction 4+ 4  Hip internal rotation    Hip external rotation    Knee flexion  (seated) 5 5  Knee extension 5 5  Ankle dorsiflexion 5 5  (* = pain; Blank rows = not tested)  Sensation Deferred  Reflexes Deferred  Cranial Nerves Deferred  Coordination/Cerebellar Finger to Nose: Mild dysmetria LUE Heel to Shin: Deferred Rapid alternating movements: Abnormal LUE Finger Opposition: WNL Pronator Drift: Negative  Bed mobility:Deferred  Transfers: Assistive device utilized: Environmental consultant - 2 wheeled  Sit to stand: Modified independence Stand to sit: Modified independence Chair to chair: Modified independence Floor: Deferred  Curb:  Deferred  Stairs: Level  of Assistance: Modified independence Stair Negotiation Technique: Step to Pattern with Bilateral Rails Number of Stairs: 4  Height of Stairs: 6  Comments: Pt moves slowly and leans backwards during descent  Gait: Gait pattern: step through pattern, decreased step length- Right, decreased step length- Left, and shuffling Distance walked: 100' Assistive device utilized: Environmental consultant - 2 wheeled Level of assistance: Modified independence Comments: ataxic gait with shuffling steps;  mCTSIB: unable to complete, pt only able to stand for approximately 2-3s in condition 1;  Orthostatic vitals (11/26/23): Supine: BP: 151/66 mmHg, HR: 66 bpm, SpO2: 97% Seated: BP: 151/58 mmHg, HR: 68 bpm, SpO2: 97% Standing (1 minute): BP: 144/60 mmHg, HR: 71 bpm, SpO2: 98% Standing (3 minutes): BP: 149/63 mmHg, HR: 69 bpm, SpO2: 99%   BPPV TESTS (11/26/23):  Symptoms Duration Intensity Nystagmus  L Dix-Hallpike Dizziness/lightheadedness   None  R Dix-Hallpike Dizziness/lightheadedness   None  L Head Roll None   None  R Head Roll None   None  L Sidelying Test      R Sidelying Test      (blank = not tested)  Functional Outcome Measures  Results 11/26/23 Comments  BERG 19/56  High fall risk  DGI  8/24   FGA     TUG 19.3 seconds  Increased fall risk  5TSTS 19.7 seconds  Increased fall risk  6 Minute Walk Test     10 Meter  Gait Speed Self-selected: 15.5s = 0.65 m/s; Fastest: 12.5s = 0.8 m/s  Below functional community ambulation speeds  (Blank rows = not tested)   TODAY'S TREATMENT: 04/17/24   SUBJECTIVE: Pt reports that she is doing alright today. No falls since the last therapy session. No significant health changes and no specific questions or concern. Pt and her husband express concern for the pt's balance since she cannot ambulate without holding on to surrounding objects like counters or furniture. No specific pain reported upon arrival today.    PAIN: Denies   Updated Outcome Measures:  Results from initial eval on 11/19/23  Results from today 04/17/24 Comments  BERG 19/56 25/56 High fall risk  DGI 8/24    FGA     TUG 19.3 seconds 19.38 without RW, 22.41 with RW Increased fall risk  5TSTS 19.7 seconds 26.76 seconds Increased fall risk  6 Minute Walk Test     10 Meter Gait Speed Self-selected: 15.5s = 0.65 m/s; Fastest: 12.5s = 0.8 m/s Self-selected: 19.49s = 0.51 m/s; Fastest: 16.73s = 0.6 m/s Below functional community ambulation speeds  ABC  40% 43.1%   (Blank rows = not tested)  Ambulation inside and outside challenging patient on inclines, declines, sideslopes, and ascending/descending curb. Pt struggles whenever she first approaches an uneven or different surface.  Verbal cues to decrease cadence and increase step length.   Not performed: NuStep L0-4 (seat 8, arms 9) x 10 minutes (4 mins unbilled) at start of session for warm-up, BLE strengthening, and interval history. Therapist adjusting resistance to modify challenge; Forward and side stepping in agility ladder with verbal and visual cues to increase step length x multiple Forward high knee marching in // bars without UE support x multiple lengths; Sit to stand 2 x 10; Seated large amplitude reaching forward, down, up, and out x multiple bouts; Seated trunk rotation with lateral large amplitude reaching across body x multiple  bouts; Standing large amplitude forward lunges with chest opening reaching x multiple bouts; Balloon taps with therapist and SPT guarding patient 2 x 1 min; Alternating  6 and 12 step taps x 10 BLE; Seated clams with manual resistance from therapist x 10 BLE Seated adductor squeezes with manual resistance from therapist x 10 BLE; Staggered balance on 6 step eyes open/closed x 30s each BLE; Staggered stance balance eyes open/closed x 30s each BLE; Staggered stance balance horizontal/vertical head turns x 30s each BLE; Resisted gait with double black tband and faded UE support forward and backward x 4 each direction; Standing hip abduction with 3# ankle weights x 10 BLE; Seated LAQ with 3# ankle weights x 10 BLE; bouts each; Lateral 6 step-ups with BUE support x 10 toward each direction; Feet together eyes open/closed x 30s each; Feet apart horizontal and vertical head turns x 30s each; Standing heel raise holds 3s x 10; Airex feet apart static balance working on knee and hip strategies to maintain balance and prevent posterior LOB; Stair practice with single UE support on rail, cues to move slowly and stabilize prior to taking each additional step;    PATIENT EDUCATION:  Education details: Outcome measures Person educated: Patient Education method: Explanation and Verbal cues Education comprehension: verbalized understanding, returned demonstration, and verbal cues required   HOME EXERCISE PROGRAM:  Access Code: [email or web address] URL: [email or web address] Date: 03/11/2024 Prepared by: Selinda Eck  Exercises - Seated Hip Abduction with Resistance  - 1 x daily - 7 x weekly - 2 sets - 10 reps - 3s hold - Seated March with Resistance  - 1 x daily - 7 x weekly - 2 sets - 10 reps - 3s hold - Seated Hip Adduction Isometrics with Ball  - 1 x daily - 7 x weekly - 2 sets - 10 reps - 3s hold - Seated Long Arc Quad  - 1 x daily - 7 x weekly - 2 sets - 10 reps - 3s hold - Mini  Squats with Walker and Chair  - 1 x daily - 7 x weekly - 2 sets - 10 reps   ASSESSMENT:  CLINICAL IMPRESSION: Updated outcome measures and goals today with patient. Pt has regressed slightly in ABC, 5TSTS, TUG, and since last goal update 4 weeks ago. However, pt has been able to maintain functional status at or above the level of her initial evaluation. Pt has met her goal of improving her BERG score by 3 pts, demonstrating a 6 point increase since last goal update. Pt is still at a very high fall risk as evidenced by her BERG score of 25/56. Pt is also currently ambulating below community participation level of 0.8 m/s for both her self-selected and fast ambulation speeds with RW. Pt still struggled with gait activities with several LOB and strong retropulsion this session. Pt struggles to self-correct her balance by shifting her weight forwards, evidenced by increased guarding assist needed by SPT. Pt will still default to rapid, festinating pattern during ambulation, especially with changes in speed or direction.  Repeated gait outside across a variety of inclines/declines/curbs. Continued to utilize external verbal and visual cues to improve step length and cadence. Plan to progress balance and strength exercises at future sessions. Pt encouraged to follow-up 1x/week for maintenance of current functional status and prevent further decline. She will benefit from PT services to address deficits in strength, balance, and mobility in order to return to full function at home and decrease her risk for falls.  OBJECTIVE IMPAIRMENTS: decreased balance and decreased strength.   ACTIVITY LIMITATIONS: standing, squatting, stairs, and transfers  PARTICIPATION LIMITATIONS: meal prep, cleaning, laundry, shopping, and community  activity  PERSONAL FACTORS: Age, Past/current experiences, Time since onset of injury/illness/exacerbation, and 3+ comorbidities: memory change, fatigue, and NPH are also affecting  patient's functional outcome.   REHAB POTENTIAL: Fair    CLINICAL DECISION MAKING: Unstable/unpredictable  EVALUATION COMPLEXITY: High   GOALS: Goals reviewed with patient? Yes  SHORT TERM GOALS: Target date: 04/01/24  Pt will be independent with HEP in order to improve strength and balance in order to decrease fall risk and improve function at home. Baseline: HEP provided  Goal status: IN progress: Not met ( Spouse reported Non Compliance)   LONG TERM GOALS: Target date: 04/22/2024   1.  Pt will improve BERG by at least 3 points in order to demonstrate clinically significant improvement in balance.   Baseline: 19/56; 01/15/24: 19/56; 20/56 on 5/1; 03/11/24: 19/56; 04/17/24: 25/56;  Goal status: ACHIEVED  2.  Pt will improve ABC by at least 13% in order to demonstrate clinically significant improvement in balance confidence.      Baseline: 40%, 01/15/24: 65%; 03/11/24: 60.6%; 04/17/24: 43.1% Goal status: ACHIEVED, REGRESSED FROM LAST UPDATE  3. Pt will decrease 5TSTS by at least 3 seconds in order to demonstrate clinically significant improvement in LE strength      Baseline: 19.7s, 01/15/24: 15.9s; 03/11/24: 17.3s; 04/17/24: 26.76s Goal status: PARTIALLY MET, REGRESSED FROM LAST UPDATE  4. Pt will decrease TUG to below 14 seconds/decrease in order to demonstrate decreased fall risk.  Baseline: 19.3s, 01/15/24: 20.7s; 5/1 24.4secs, 03/11/24: 17.2s; 04/17/24: 19.38s   Goal status: PARTIALLY MET   PLAN: PT FREQUENCY: 1-2x/week  PT DURATION: 6 weeks  PLANNED INTERVENTIONS: Therapeutic exercises, Therapeutic activity, Neuromuscular re-education, Balance training, Gait training, Patient/Family education, Self Care, Joint mobilization, Joint manipulation, Vestibular training, Canalith repositioning, Orthotic/Fit training, DME instructions, Dry Needling, Electrical stimulation, Spinal manipulation, Spinal mobilization, Cryotherapy, Moist heat, Taping, Traction, Ultrasound, Ionotophoresis 4mg /ml  Dexamethasone, Manual therapy, and Re-evaluation.  PLAN FOR NEXT SESSION: progress balance and strength exercises, review/modify HEP as needed;   Vernell Moats, SPT Jason D Huprich PT, DPT, GCS  Huprich,Jason 04/18/2024, 9:48 AM

## 2024-04-22 ENCOUNTER — Ambulatory Visit: Attending: Internal Medicine

## 2024-04-22 DIAGNOSIS — R2681 Unsteadiness on feet: Secondary | ICD-10-CM | POA: Insufficient documentation

## 2024-04-22 DIAGNOSIS — R262 Difficulty in walking, not elsewhere classified: Secondary | ICD-10-CM | POA: Insufficient documentation

## 2024-04-22 DIAGNOSIS — M6281 Muscle weakness (generalized): Secondary | ICD-10-CM | POA: Insufficient documentation

## 2024-04-22 DIAGNOSIS — R2689 Other abnormalities of gait and mobility: Secondary | ICD-10-CM | POA: Insufficient documentation

## 2024-04-22 DIAGNOSIS — R278 Other lack of coordination: Secondary | ICD-10-CM | POA: Insufficient documentation

## 2024-04-22 NOTE — Therapy (Unsigned)
 OUTPATIENT PHYSICAL THERAPY BALANCE TREATMENT   Patient Name: Crystal Lynch MRN: [id number] DOB:02/27/1941, 83 y.o., female Today's Date: 04/23/2024  END OF SESSION:  PT End of Session - 04/22/24 1613     Visit Number 29    Number of Visits 57    Date for PT Re-Evaluation 05/30/24    Authorization Type eval: 11/19/23    PT Start Time 1615    PT Stop Time 1705    PT Time Calculation (min) 50 min    Equipment Utilized During Treatment Gait belt    Activity Tolerance Patient tolerated treatment well    Behavior During Therapy WFL for tasks assessed/performed          Past Medical History:  Diagnosis Date   Arthritis    Chicken pox    Endometriosis    Esophagitis    s/p esophageal dilatation   GERD (gastroesophageal reflux disease)    History of ovarian cyst    Hypercholesterolemia    Hypertension    Hypothyroidism    Past Surgical History:  Procedure Laterality Date   ABDOMINAL SURGERY     found to have an ovarian cyst and endometriosis   APPENDECTOMY     Patient Active Problem List   Diagnosis Date Noted   Low back pain 12/10/2023   Postnasal drip 12/10/2023   Bursitis of right hip 09/12/2023   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   Stress 07/27/2022   Unsteady gait 07/10/2022   Hypophosphatemia 06/11/2022   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   OSA (obstructive sleep apnea) 06/09/2022   Kidney lesion 12/14/2021   Left hip pain 12/13/2021   Memory change 11/27/2021   Fatigue 07/23/2021   Aortic atherosclerosis (HCC) 04/17/2021   Right acute serous otitis media    Hyponatremia 02/23/2021   Prolonged QT interval 02/23/2021   Dizziness 09/12/2020   Fall 03/08/2020   Fatty liver 12/28/2019   Chest pain 07/25/2019   Frequent urinary tract infections 12/24/2018   UTI (urinary tract infection) 10/11/2018   Hypercalcemia 01/09/2017   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   Sleeping difficulties 03/08/2015   Health care maintenance 01/10/2015    Osteopenia 08/10/2014   Hyperbilirubinemia 10/27/2012   Hematuria 10/27/2012   Hypertension 10/21/2012   Hypercholesterolemia 10/21/2012   Hypothyroidism 10/21/2012   Esophagitis 10/21/2012    PCP: Glendia Shad, MD  REFERRING PROVIDER: Glendia Shad, MD   REFERRING DIAG: R26.81 (ICD-10-CM) - Unsteady gait   RATIONALE FOR EVALUATION AND TREATMENT: Rehabilitation  THERAPY DIAG: Unsteadiness on feet  Other abnormalities of gait and mobility  Other lack of coordination  Difficulty in walking, not elsewhere classified  Muscle weakness (generalized)  ONSET DATE: Chronic x years  FOLLOW-UP APPT SCHEDULED WITH REFERRING PROVIDER: Yes   FROM INITIAL EVALUATION SUBJECTIVE:  SUBJECTIVE STATEMENT:  Unsteadiness  PERTINENT HISTORY:  Pt referred to PT for unsteadiness and dizziness. History today obtained from both patient and husband. I'm dizzy all the time. She denies vertigo but reports persistent unsteadiness with occasional lightheadedness. Pt has a history of NPH. She underwent a shunt trial at [facility] but there was not enough change in her testing to merit a permanent shunt placement. She is currently being followed by [facility] neurology and has a follow-up appointment this Wednesday. 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 She had a alpha-synuclein skin biopsy, EMG/NCS, echo, and CTA head and neck. 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 Recommended baby aspirin  and CTA 6 months. 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 No evidence of motor neuron disease.  Pt reports shuffling gait and freezing episodes but no tremor. She was started on carbidopa-levodopa but has not noticed any change in her symptoms since starting. She uses a rollator at home and front  wheeled walker when out in the community. Pt wears eyeglasses all the time and has an upcoming check-up with optometry. History of occasional sinus headaches but no history of migraines. Pt reports some L hip weakness.  Pain: Yes, L hip pain Numbness/Tingling: No Focal Weakness: Yes, L hip weakness related to pain; Recent changes in overall health/medication: Yes, carvidopa/levodopa Prior history of physical therapy for balance:  Yes Dominant hand: right Red flags: no personal history of cancer, abdominal pain, chills/fever, night sweats, nausea, vomiting, unrelenting pain  PRECAUTIONS: None  WEIGHT BEARING RESTRICTIONS: No  FALLS: Has patient fallen in last 6 months? Yes. Number of falls 7-8, Directional pattern for falls: Yes Backwards , often occurs when turning.   Living Environment Lives with: lives with their spouse Lives in: House/apartment Stairs: 2 to enter, railing on left during ascend Has following equipment at home: [facility] over commode, walk-in shower with seat and grab bars. Wheelchair for extended distances.   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   Hobbies: Enjoys crafting, reading.   Patient Goals: Build confidence in her balance   OBJECTIVE:   Patient Surveys  ABC: 40%  Cognition Patient is oriented to person, place, and time.  Recent memory is grossly intact however some memory deficit is evident. Remote memory is intact.  Attention span and concentration are intact.  Expressive speech is intact.  Patient's fund of knowledge is within normal limits for educational level.    Gross Musculoskeletal Assessment Tremor: None Bulk: Normal Tone: Normal  Posture: No gross abnormalities noted in standing or seated posture with the exception of some mild forward head.  AROM Deferred  LE MMT: MMT (out of 5) Right  Left   Hip flexion 5 4+  Hip extension    Hip  abduction (seated) 4+ 4  Hip adduction 4+ 4  Hip internal rotation    Hip external rotation    Knee flexion (seated) 5 5  Knee extension 5 5  Ankle dorsiflexion 5 5  (* = pain; Blank rows = not tested)  Sensation Deferred  Reflexes Deferred  Cranial Nerves Deferred  Coordination/Cerebellar Finger to Nose: Mild dysmetria LUE Heel to Shin: Deferred Rapid alternating movements: Abnormal LUE Finger Opposition: WNL Pronator Drift: Negative  Bed mobility:Deferred  Transfers: Assistive device utilized: Environmental consultant - 2 wheeled  Sit to stand: Modified independence Stand to sit: Modified independence Chair to chair: Modified independence Floor: Deferred  Curb:  Deferred  Stairs: Level  of Assistance: Modified independence Stair Negotiation Technique: Step to Pattern with Bilateral Rails Number of Stairs: 4  Height of Stairs: 6  Comments: Pt moves slowly and leans backwards during descent  Gait: Gait pattern: step through pattern, decreased step length- Right, decreased step length- Left, and shuffling Distance walked: 100' Assistive device utilized: Environmental consultant - 2 wheeled Level of assistance: Modified independence Comments: ataxic gait with shuffling steps;  mCTSIB: unable to complete, pt only able to stand for approximately 2-3s in condition 1;  Orthostatic vitals (11/26/23): Supine: BP: 151/66 mmHg, HR: 66 bpm, SpO2: 97% Seated: BP: 151/58 mmHg, HR: 68 bpm, SpO2: 97% Standing (1 minute): BP: 144/60 mmHg, HR: 71 bpm, SpO2: 98% Standing (3 minutes): BP: 149/63 mmHg, HR: 69 bpm, SpO2: 99%   BPPV TESTS (11/26/23):  Symptoms Duration Intensity Nystagmus  L Dix-Hallpike Dizziness/lightheadedness   None  R Dix-Hallpike Dizziness/lightheadedness   None  L Head Roll None   None  R Head Roll None   None  L Sidelying Test      R Sidelying Test      (blank = not tested)  Functional Outcome Measures  Results 11/26/23 Comments  BERG 19/56  High fall risk  DGI  8/24   FGA     TUG  19.3 seconds  Increased fall risk  5TSTS 19.7 seconds  Increased fall risk  6 Minute Walk Test     10 Meter Gait Speed Self-selected: 15.5s = 0.65 m/s; Fastest: 12.5s = 0.8 m/s  Below functional community ambulation speeds  (Blank rows = not tested)   Updated Outcome Measures 04/17/24:   Results from initial eval on 11/19/23  Results from today 04/17/24 Comments  BERG 19/56 25/56 High fall risk  DGI 8/24      FGA        TUG 19.3 seconds 19.38 without RW, 22.41 with RW Increased fall risk  5TSTS 19.7 seconds 26.76 seconds Increased fall risk  6 Minute Walk Test        10 Meter Gait Speed Self-selected: 15.5s = 0.65 m/s; Fastest: 12.5s = 0.8 m/s Self-selected: 19.49s = 0.51 m/s; Fastest: 16.73s = 0.6 m/s Below functional community ambulation speeds  ABC  40% 43.1%    (Blank rows = not tested)   TODAY'S TREATMENT 04/22/24  SUBJECTIVE: Pt reports that she is doing alright today. No falls since the last therapy session. No significant health changes and no specific questions or concern. No specific pain reported upon arrival today.    PAIN: Denies   Therapeutic Activity NuStep L0-4 (seat 8, arms 9) x 10 minutes (5 mins unbilled) at start of session for warm-up, BLE strengthening, and interval history. Therapist adjusting resistance to modify challenge;  Forward and side stepping in agility ladder with verbal and visual cues to increase step length x multiple Forward high knee marching in // bars without UE support x multiple lengths; Sit to stand 2 x 10; Extensive ambulation inside and outside challenging patient on inclines, declines, sideslopes, and ascending/descending curb. Pt struggles whenever she first approaches an uneven or different surface.  Verbal cues to decrease cadence and increase step length. Utilized a metronome with beat at 95 spm to help decrease her cadence;   Neuromuscular Re-ed Seated large amplitude reaching forward, down, up, and out x multiple bouts; Seated  trunk rotation with lateral large amplitude reaching across body x multiple bouts; Standing large amplitude forward lunges with chest opening reaching x multiple bouts; Balloon taps with therapist and SPT guarding patient 2  x 2 min;   Not performed: Alternating 6 and 12 step taps x 10 BLE; Seated clams with manual resistance from therapist x 10 BLE Seated adductor squeezes with manual resistance from therapist x 10 BLE; Staggered balance on 6 step eyes open/closed x 30s each BLE; Staggered stance balance eyes open/closed x 30s each BLE; Staggered stance balance horizontal/vertical head turns x 30s each BLE; Resisted gait with double black tband and faded UE support forward and backward x 4 each direction; Standing hip abduction with 3# ankle weights x 10 BLE; Seated LAQ with 3# ankle weights x 10 BLE; bouts each; Lateral 6 step-ups with BUE support x 10 toward each direction; Feet together eyes open/closed x 30s each; Feet apart horizontal and vertical head turns x 30s each; Standing heel raise holds 3s x 10; Airex feet apart static balance working on knee and hip strategies to maintain balance and prevent posterior LOB; Total Gym L22 double leg squats 2 x 15; TG L22 double leg heel raises 2 x 20; Stair practice with single UE support on rail, cues to move slowly and stabilize prior to taking each additional step;    PATIENT EDUCATION:  Education details: Outcome measures Person educated: Patient Education method: Explanation and Verbal cues Education comprehension: verbalized understanding, returned demonstration, and verbal cues required   HOME EXERCISE PROGRAM:  Access Code: [email or web address] URL: [email or web address] Date: 03/11/2024 Prepared by: Selinda Eck  Exercises - Seated Hip Abduction with Resistance  - 1 x daily - 7 x weekly - 2 sets - 10 reps - 3s hold - Seated March with Resistance  - 1 x daily - 7 x weekly - 2 sets - 10 reps - 3s hold - Seated Hip  Adduction Isometrics with Ball  - 1 x daily - 7 x weekly - 2 sets - 10 reps - 3s hold - Seated Long Arc Quad  - 1 x daily - 7 x weekly - 2 sets - 10 reps - 3s hold - Mini Squats with Walker and Chair  - 1 x daily - 7 x weekly - 2 sets - 10 reps   ASSESSMENT:  CLINICAL IMPRESSION: Repeated dynamic gait/balance training with continued focus on longer step lengths during gait. Pt still struggled with gait activities with festination and several LOB but retropulsion was less frequent this session evidenced by decreased physical assistance given by SPT/DPT. Pt was able to self-correct her balance more this session by shifting her weight forwards after verbal cues by SPT. Pt still struggles with ladder side-stepping and will default to rapid, festinating pattern.  Repeated gait outside across a variety of inclines/declines/curbs. Continued to utilize external verbal and visual cues to improve step length and cadence. Plan to progress balance and strength exercises at future sessions. Pt encouraged to follow-up as scheduled. She will benefit from PT services to address deficits in strength, balance, and mobility in order to return to full function at home and decrease her risk for falls.  OBJECTIVE IMPAIRMENTS: decreased balance and decreased strength.   ACTIVITY LIMITATIONS: standing, squatting, stairs, and transfers  PARTICIPATION LIMITATIONS: meal prep, cleaning, laundry, shopping, and community activity  PERSONAL FACTORS: Age, Past/current experiences, Time since onset of injury/illness/exacerbation, and 3+ comorbidities: memory change, fatigue, and NPH are also affecting patient's functional outcome.   REHAB POTENTIAL: Fair    CLINICAL DECISION MAKING: Unstable/unpredictable  EVALUATION COMPLEXITY: High   GOALS: Goals reviewed with patient? Yes  SHORT TERM GOALS: Target date: 04/01/24  Pt will be independent with HEP in  order to improve strength and balance in order to decrease fall risk  and improve function at home. Baseline: HEP provided  Goal status: IN progress: Not met ( Spouse reported Non Compliance)   LONG TERM GOALS: Target date: 04/22/2024   1.  Pt will improve BERG by at least 3 points in order to demonstrate clinically significant improvement in balance.   Baseline: 19/56; 01/15/24: 19/56; 20/56 on 5/1; 03/11/24: 19/56; 04/17/24: 25/56;  Goal status: ACHIEVED   2.  Pt will improve ABC by at least 13% in order to demonstrate clinically significant improvement in balance confidence.      Baseline: 40%, 01/15/24: 65%; 03/11/24: 60.6%; 04/17/24: 43.1% Goal status: ACHIEVED, REGRESSED FROM LAST UPDATE   3. Pt will decrease 5TSTS by at least 3 seconds in order to demonstrate clinically significant improvement in LE strength      Baseline: 19.7s, 01/15/24: 15.9s; 03/11/24: 17.3s; 04/17/24: 26.76s Goal status: PARTIALLY MET, REGRESSED FROM LAST UPDATE   4. Pt will decrease TUG to below 14 seconds/decrease in order to demonstrate decreased fall risk.  Baseline: 19.3s, 01/15/24: 20.7s; 5/1 24.4secs, 03/11/24: 17.2s; 04/17/24: 19.38s   Goal status: PARTIALLY MET   PLAN: PT FREQUENCY: 1-2x/week  PT DURATION: 6 weeks  PLANNED INTERVENTIONS: Therapeutic exercises, Therapeutic activity, Neuromuscular re-education, Balance training, Gait training, Patient/Family education, Self Care, Joint mobilization, Joint manipulation, Vestibular training, Canalith repositioning, Orthotic/Fit training, DME instructions, Dry Needling, Electrical stimulation, Spinal manipulation, Spinal mobilization, Cryotherapy, Moist heat, Taping, Traction, Ultrasound, Ionotophoresis 4mg /ml Dexamethasone, Manual therapy, and Re-evaluation.  PLAN FOR NEXT SESSION: Progress note. Progress balance and strength exercises, review/modify HEP as needed;   Vernell Moats, SPT  Dorina HERO. Fairly IV, PT, DPT Physical Therapist- [place]  [facility] 04/23/2024, 8:00 AM

## 2024-05-01 ENCOUNTER — Ambulatory Visit

## 2024-05-01 DIAGNOSIS — R2689 Other abnormalities of gait and mobility: Secondary | ICD-10-CM

## 2024-05-01 DIAGNOSIS — R2681 Unsteadiness on feet: Secondary | ICD-10-CM

## 2024-05-01 DIAGNOSIS — R262 Difficulty in walking, not elsewhere classified: Secondary | ICD-10-CM

## 2024-05-01 DIAGNOSIS — R278 Other lack of coordination: Secondary | ICD-10-CM

## 2024-05-01 NOTE — Therapy (Signed)
 OUTPATIENT PHYSICAL THERAPY BALANCE TREATMENT/PROGRESS NOTE  Dates of reporting period  02/21/24   to   05/01/24    Patient Name: Crystal Lynch MRN: [id number] DOB:1941/04/13, 83 y.o., female Today's Date: 05/02/2024  END OF SESSION:  PT End of Session - 05/01/24 1106     Visit Number 30    Number of Visits 57    Date for PT Re-Evaluation 05/29/24    Authorization Type eval: 11/19/23    PT Start Time 1100    PT Stop Time 1145    PT Time Calculation (min) 45 min    Equipment Utilized During Treatment Gait belt    Activity Tolerance Patient tolerated treatment well    Behavior During Therapy WFL for tasks assessed/performed          Past Medical History:  Diagnosis Date   Arthritis    Chicken pox    Endometriosis    Esophagitis    s/p esophageal dilatation   GERD (gastroesophageal reflux disease)    History of ovarian cyst    Hypercholesterolemia    Hypertension    Hypothyroidism    Past Surgical History:  Procedure Laterality Date   ABDOMINAL SURGERY     found to have an ovarian cyst and endometriosis   APPENDECTOMY     Patient Active Problem List   Diagnosis Date Noted   Low back pain 12/10/2023   Postnasal drip 12/10/2023   Bursitis of right hip 09/12/2023   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   Stress 07/27/2022   Unsteady gait 07/10/2022   Hypophosphatemia 06/11/2022   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   OSA (obstructive sleep apnea) 06/09/2022   Kidney lesion 12/14/2021   Left hip pain 12/13/2021   Memory change 11/27/2021   Fatigue 07/23/2021   Aortic atherosclerosis (HCC) 04/17/2021   Right acute serous otitis media    Hyponatremia 02/23/2021   Prolonged QT interval 02/23/2021   Dizziness 09/12/2020   Fall 03/08/2020   Fatty liver 12/28/2019   Chest pain 07/25/2019   Frequent urinary tract infections 12/24/2018   UTI (urinary tract infection) 10/11/2018   Hypercalcemia 01/09/2017   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   Sleeping  difficulties 03/08/2015   Health care maintenance 01/10/2015   Osteopenia 08/10/2014   Hyperbilirubinemia 10/27/2012   Hematuria 10/27/2012   Hypertension 10/21/2012   Hypercholesterolemia 10/21/2012   Hypothyroidism 10/21/2012   Esophagitis 10/21/2012    PCP: Glendia Shad, MD  REFERRING PROVIDER: Glendia Shad, MD   REFERRING DIAG: R26.81 (ICD-10-CM) - Unsteady gait   RATIONALE FOR EVALUATION AND TREATMENT: Rehabilitation  THERAPY DIAG: Unsteadiness on feet  Other lack of coordination  Difficulty in walking, not elsewhere classified  Other abnormalities of gait and mobility  ONSET DATE: Chronic x years  FOLLOW-UP APPT SCHEDULED WITH REFERRING PROVIDER: Yes   FROM INITIAL EVALUATION SUBJECTIVE:  SUBJECTIVE STATEMENT:  Unsteadiness  PERTINENT HISTORY:  Pt referred to PT for unsteadiness and dizziness. History today obtained from both patient and husband. I'm dizzy all the time. She denies vertigo but reports persistent unsteadiness with occasional lightheadedness. Pt has a history of NPH. She underwent a shunt trial at [facility] but there was not enough change in her testing to merit a permanent shunt placement. She is currently being followed by [facility] neurology and has a follow-up appointment this Wednesday. 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 She had a alpha-synuclein skin biopsy, EMG/NCS, echo, and CTA head and neck. 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 Recommended baby aspirin  and CTA 6 months. 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 No evidence of motor neuron disease.  Pt reports shuffling gait and freezing episodes but no tremor. She was started on carbidopa-levodopa but has not noticed any change in her symptoms since starting. She  uses a rollator at home and front wheeled walker when out in the community. Pt wears eyeglasses all the time and has an upcoming check-up with optometry. History of occasional sinus headaches but no history of migraines. Pt reports some L hip weakness.  Pain: Yes, L hip pain Numbness/Tingling: No Focal Weakness: Yes, L hip weakness related to pain; Recent changes in overall health/medication: Yes, carvidopa/levodopa Prior history of physical therapy for balance:  Yes Dominant hand: right Red flags: no personal history of cancer, abdominal pain, chills/fever, night sweats, nausea, vomiting, unrelenting pain  PRECAUTIONS: None  WEIGHT BEARING RESTRICTIONS: No  FALLS: Has patient fallen in last 6 months? Yes. Number of falls 7-8, Directional pattern for falls: Yes Backwards , often occurs when turning.   Living Environment Lives with: lives with their spouse Lives in: House/apartment Stairs: 2 to enter, railing on left during ascend Has following equipment at home: [facility] over commode, walk-in shower with seat and grab bars. Wheelchair for extended distances.   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   Hobbies: Enjoys crafting, reading.   Patient Goals: Build confidence in her balance   OBJECTIVE:   Patient Surveys  ABC: 40%  Cognition Patient is oriented to person, place, and time.  Recent memory is grossly intact however some memory deficit is evident. Remote memory is intact.  Attention span and concentration are intact.  Expressive speech is intact.  Patient's fund of knowledge is within normal limits for educational level.    Gross Musculoskeletal Assessment Tremor: None Bulk: Normal Tone: Normal  Posture: No gross abnormalities noted in standing or seated posture with the exception of some mild forward head.  AROM Deferred  LE MMT: MMT (out of 5) Right  Left   Hip flexion 5  4+  Hip extension    Hip abduction (seated) 4+ 4  Hip adduction 4+ 4  Hip internal rotation    Hip external rotation    Knee flexion (seated) 5 5  Knee extension 5 5  Ankle dorsiflexion 5 5  (* = pain; Blank rows = not tested)  Sensation Deferred  Reflexes Deferred  Cranial Nerves Deferred  Coordination/Cerebellar Finger to Nose: Mild dysmetria LUE Heel to Shin: Deferred Rapid alternating movements: Abnormal LUE Finger Opposition: WNL Pronator Drift: Negative  Bed mobility:Deferred  Transfers: Assistive device utilized: Environmental consultant - 2 wheeled  Sit to stand: Modified independence Stand to sit: Modified independence Chair to chair: Modified independence Floor: Deferred  Curb:  Deferred  Stairs: Level  of Assistance: Modified independence Stair Negotiation Technique: Step to Pattern with Bilateral Rails Number of Stairs: 4  Height of Stairs: 6  Comments: Pt moves slowly and leans backwards during descent  Gait: Gait pattern: step through pattern, decreased step length- Right, decreased step length- Left, and shuffling Distance walked: 100' Assistive device utilized: Environmental consultant - 2 wheeled Level of assistance: Modified independence Comments: ataxic gait with shuffling steps;  mCTSIB: unable to complete, pt only able to stand for approximately 2-3s in condition 1;  Orthostatic vitals (11/26/23): Supine: BP: 151/66 mmHg, HR: 66 bpm, SpO2: 97% Seated: BP: 151/58 mmHg, HR: 68 bpm, SpO2: 97% Standing (1 minute): BP: 144/60 mmHg, HR: 71 bpm, SpO2: 98% Standing (3 minutes): BP: 149/63 mmHg, HR: 69 bpm, SpO2: 99%   BPPV TESTS (11/26/23):  Symptoms Duration Intensity Nystagmus  L Dix-Hallpike Dizziness/lightheadedness   None  R Dix-Hallpike Dizziness/lightheadedness   None  L Head Roll None   None  R Head Roll None   None  L Sidelying Test      R Sidelying Test      (blank = not tested)  Functional Outcome Measures  Results 11/26/23 Comments  BERG 19/56  High fall risk   DGI  8/24   FGA     TUG 19.3 seconds  Increased fall risk  5TSTS 19.7 seconds  Increased fall risk  6 Minute Walk Test     10 Meter Gait Speed Self-selected: 15.5s = 0.65 m/s; Fastest: 12.5s = 0.8 m/s  Below functional community ambulation speeds  (Blank rows = not tested)   Updated Outcome Measures 04/17/24:   Results from initial eval on 11/19/23  Results from today 04/17/24 Comments  BERG 19/56 25/56 High fall risk  DGI 8/24      FGA        TUG 19.3 seconds 19.38 without RW, 22.41 with RW Increased fall risk  5TSTS 19.7 seconds 26.76 seconds Increased fall risk  6 Minute Walk Test        10 Meter Gait Speed Self-selected: 15.5s = 0.65 m/s; Fastest: 12.5s = 0.8 m/s Self-selected: 19.49s = 0.51 m/s; Fastest: 16.73s = 0.6 m/s Below functional community ambulation speeds  ABC  40% 43.1%    (Blank rows = not tested)   TODAY'S TREATMENT 05/01/24  SUBJECTIVE: Pt reports that she is doing alright today. No falls since the last therapy session. No significant health changes and no specific questions or concern. No specific pain reported upon arrival today.    PAIN: Denies   Therapeutic Activity NuStep L0-4 (seat 8, arms 9) x 10 minutes (5 mins unbilled) at start of session for warm-up, BLE strengthening, and interval history. Therapist adjusting resistance to modify challenge;  Forward and side stepping in agility ladder with verbal and visual cues to increase step length x multiple Extensive ambulation inside and outside challenging patient on inclines, declines, sideslopes, and ascending/descending curb. Pt struggles whenever she first approaches an uneven or different surface.  Verbal cues to decrease cadence and increase step length.   Updated Outcome Measures 05/01/24:   Results from initial eval on 11/19/23  Results from 04/17/24 Results from today 05/01/24 Comments  BERG 19/56 25/56  High fall risk  DGI 8/24       FGA         TUG 19.3 seconds 19.38 without RW, 22.41 with RW  17.36 without RW, 21.96 with RW Increased fall risk  5TSTS 19.7 seconds 26.76 seconds 16.81 seconds Increased fall risk  6 Minute Walk  Test         10 Meter Gait Speed Self-selected: 15.5s = 0.65 m/s; Fastest: 12.5s = 0.8 m/s Self-selected: 19.49s = 0.51 m/s; Fastest: 16.73s = 0.6 m/s  Below functional community ambulation speeds  ABC  40% 43.1% 52.8%    (Blank rows = not tested)   Neuromuscular Re-ed Seated large amplitude reaching forward, down, up, and out x multiple bouts; Seated trunk rotation with lateral large amplitude reaching across body x multiple bouts; Standing large amplitude forward lunges with chest opening reaching x multiple bouts;   Not performed: Alternating 6 and 12 step taps x 10 BLE; Seated clams with manual resistance from therapist x 10 BLE Seated adductor squeezes with manual resistance from therapist x 10 BLE; Staggered balance on 6 step eyes open/closed x 30s each BLE; Staggered stance balance eyes open/closed x 30s each BLE; Staggered stance balance horizontal/vertical head turns x 30s each BLE; Resisted gait with double black tband and faded UE support forward and backward x 4 each direction; Standing hip abduction with 3# ankle weights x 10 BLE; Seated LAQ with 3# ankle weights x 10 BLE; bouts each; Lateral 6 step-ups with BUE support x 10 toward each direction; Feet together eyes open/closed x 30s each; Feet apart horizontal and vertical head turns x 30s each; Standing heel raise holds 3s x 10; Airex feet apart static balance working on knee and hip strategies to maintain balance and prevent posterior LOB; Total Gym L22 double leg squats 2 x 15; TG L22 double leg heel raises 2 x 20; Stair practice with single UE support on rail, cues to move slowly and stabilize prior to taking each additional step; Forward high knee marching in // bars without UE support x multiple lengths; Sit to stand 2 x 10; Balloon taps with therapist and SPT guarding patient  2 x 2 min;   PATIENT EDUCATION:  Education details: Outcome measures Person educated: Patient Education method: Explanation and Verbal cues Education comprehension: verbalized understanding, returned demonstration, and verbal cues required   HOME EXERCISE PROGRAM:  Access Code: [email or web address] URL: [email or web address] Date: 03/11/2024 Prepared by: Selinda Eck  Exercises - Seated Hip Abduction with Resistance  - 1 x daily - 7 x weekly - 2 sets - 10 reps - 3s hold - Seated March with Resistance  - 1 x daily - 7 x weekly - 2 sets - 10 reps - 3s hold - Seated Hip Adduction Isometrics with Ball  - 1 x daily - 7 x weekly - 2 sets - 10 reps - 3s hold - Seated Long Arc Quad  - 1 x daily - 7 x weekly - 2 sets - 10 reps - 3s hold - Mini Squats with Walker and Chair  - 1 x daily - 7 x weekly - 2 sets - 10 reps   ASSESSMENT:  CLINICAL IMPRESSION: Revisited goals and outcome measures this visit with patient. Pt improved her TUG from 19.38 sec on 04/17/24 to 17.36 sec this visit. Pt also improved her 5TSTS from 26.76 sec on 04/17/24 to 16.81 sec this visit. Repeated dynamic gait/balance training with continued focus on longer step lengths during gait. Pt still struggled with gait activities with festination and several LOB but retropulsion was less frequent with less intensity this session evidenced by decreased physical assistance given by SPT/DPT. Pt was able to self-correct her balance more this session by shifting her weight forwards after verbal cues by SPT. Pt still struggles with ladder side-stepping and will default to rapid,  festinating pattern.  Repeated gait outside across a variety of inclines/declines/curbs. Continued to utilize external verbal and visual cues to improve step length and cadence. Plan to progress balance and strength exercises at future sessions. Pt encouraged to follow-up as scheduled. She will benefit from PT services to address deficits in strength, balance, and  mobility in order to return to full function at home and decrease her risk for falls.    OBJECTIVE IMPAIRMENTS: decreased balance and decreased strength.   ACTIVITY LIMITATIONS: standing, squatting, stairs, and transfers  PARTICIPATION LIMITATIONS: meal prep, cleaning, laundry, shopping, and community activity  PERSONAL FACTORS: Age, Past/current experiences, Time since onset of injury/illness/exacerbation, and 3+ comorbidities: memory change, fatigue, and NPH are also affecting patient's functional outcome.   REHAB POTENTIAL: Fair    CLINICAL DECISION MAKING: Unstable/unpredictable  EVALUATION COMPLEXITY: High   GOALS: Goals reviewed with patient? Yes  SHORT TERM GOALS: Target date: 04/01/24  Pt will be independent with HEP in order to improve strength and balance in order to decrease fall risk and improve function at home. Baseline: HEP provided  Goal status: IN progress  LONG TERM GOALS: Target date: 05/29/2024   1.  Pt will improve BERG by at least 3 points in order to demonstrate clinically significant improvement in balance.   Baseline: 19/56; 01/15/24: 19/56; 20/56 on 5/1; 03/11/24: 19/56; 04/17/24: 25/56;  Goal status: ACHIEVED   2.  Pt will improve ABC by at least 13% in order to demonstrate clinically significant improvement in balance confidence.      Baseline: 40%, 01/15/24: 65%; 03/11/24: 60.6%; 04/17/24: 43.1%; 05/01/24: 52.8% Goal status: ACHIEVED, PROGRESSED FROM LAST UPDATE   3. Pt will decrease 5TSTS by at least 3 seconds in order to demonstrate clinically significant improvement in LE strength      Baseline: 19.7s, 01/15/24: 15.9s; 03/11/24: 17.3s; 04/17/24: 26.76s; 05/01/24: 16.81s  Goal status: ACHIEVED   4. Pt will decrease TUG to below 14 seconds/decrease in order to demonstrate decreased fall risk.  Baseline: 19.3s, 01/15/24: 20.7s; 5/1 24.4secs, 03/11/24: 17.2s; 04/17/24: 19.38s, 05/01/24: 17.36s Goal status: PARTIALLY MET   PLAN: PT FREQUENCY: 1-2x/week  PT  DURATION: 6 weeks  PLANNED INTERVENTIONS: Therapeutic exercises, Therapeutic activity, Neuromuscular re-education, Balance training, Gait training, Patient/Family education, Self Care, Joint mobilization, Joint manipulation, Vestibular training, Canalith repositioning, Orthotic/Fit training, DME instructions, Dry Needling, Electrical stimulation, Spinal manipulation, Spinal mobilization, Cryotherapy, Moist heat, Taping, Traction, Ultrasound, Ionotophoresis 4mg /ml Dexamethasone, Manual therapy, and Re-evaluation.  PLAN FOR NEXT SESSION: Progress balance and strength exercises, review/modify HEP as needed;   Vernell Moats, SPT Jason D Huprich PT, DPT, GCS  Huprich,Jason  05/02/2024, 12:00 PM

## 2024-05-06 ENCOUNTER — Ambulatory Visit (INDEPENDENT_AMBULATORY_CARE_PROVIDER_SITE_OTHER): Admitting: Family

## 2024-05-06 ENCOUNTER — Ambulatory Visit: Payer: Self-pay

## 2024-05-06 VITALS — BP 122/70 | HR 68 | Temp 97.7°F | Ht 62.0 in | Wt 143.0 lb

## 2024-05-06 DIAGNOSIS — R300 Dysuria: Secondary | ICD-10-CM

## 2024-05-06 DIAGNOSIS — R399 Unspecified symptoms and signs involving the genitourinary system: Secondary | ICD-10-CM

## 2024-05-06 LAB — POCT URINALYSIS DIPSTICK
Bilirubin, UA: NEGATIVE
Glucose, UA: NEGATIVE
Nitrite, UA: NEGATIVE
Protein, UA: POSITIVE — AB
Spec Grav, UA: 1.025 (ref 1.010–1.025)
Urobilinogen, UA: 0.2 U/dL
pH, UA: 5.5 (ref 5.0–8.0)

## 2024-05-06 LAB — URINALYSIS, ROUTINE W REFLEX MICROSCOPIC
Bilirubin Urine: NEGATIVE
Ketones, ur: NEGATIVE
Nitrite: NEGATIVE
Specific Gravity, Urine: 1.025 (ref 1.000–1.030)
Total Protein, Urine: 30 — AB
Urine Glucose: NEGATIVE
Urobilinogen, UA: 0.2 (ref 0.0–1.0)
pH: 6 (ref 5.0–8.0)

## 2024-05-06 MED ORDER — NITROFURANTOIN MONOHYD MACRO 100 MG PO CAPS: 100.0000 mg | ORAL_CAPSULE | Freq: Two times a day (BID) | ORAL | 0 refills | Status: DC

## 2024-05-06 NOTE — Progress Notes (Signed)
 Assessment & Plan:  UTI symptoms -     Urinalysis, Routine w reflex microscopic -     Urine Culture -     POCT urinalysis dipstick -     Nitrofurantoin  Monohyd Macro; Take 1 capsule (100 mg total) by mouth 2 (two) times daily. Take with food.  Dispense: 10 capsule; Refill: 0  Dysuria Assessment & Plan: Urine positive leukocytes , blood. Concern for UTI. Start macrobid  while we await culture. She will let me know how she is doing.       Return precautions given.   Risks, benefits, and alternatives of the medications and treatment plan prescribed today were discussed, and patient expressed understanding.   Education regarding symptom management and diagnosis given to patient on AVS either electronically or printed.  No follow-ups on file.  Rollene Northern, FNP  Subjective:    Patient ID: Crystal Lynch, female    DOB: 31-Jul-1941, 83 y.o.   MRN: [id number]  CC: Crystal Lynch is a 83 y.o. female who presents today for an acute visit.    HPI: Complains of dysuria x 1 day Endorses urinary frequency Dull BL low back pain No fever, chills, vomiting, constipation, rectal bleeding.  When she wiped, she noticed a 'pink tinge' on toilet paper.     She eats yogurt daily.   H/o UTI, HTN, OSA, hypothyroidism  Allergies: Meloxicam Current Outpatient Medications on File Prior to Visit  Medication Sig Dispense Refill   acetaminophen  (TYLENOL ) 500 MG tablet Take 1 tablet (500 mg total) by mouth every 6 (six) hours as needed for mild pain (or Fever >/= 101). 30 tablet 0   amLODipine  (NORVASC ) 5 MG tablet Take 1 tablet (5 mg total) by mouth daily. 90 tablet 3   aspirin  EC 81 MG tablet Take 1 tablet (81 mg total) by mouth daily. Swallow whole.     azelastine  (ASTELIN ) 0.1 % nasal spray Place 2 sprays into both nostrils 2 (two) times daily. Use in each nostril as directed 30 mL 1   Biotin 5 MG CAPS Take 5,000 mcg by mouth daily.     carvedilol  (COREG ) 12.5 MG tablet Take 1 tablet  (12.5 mg total) by mouth 2 (two) times daily with a meal. 180 tablet 3   dorzolamide-timolol (COSOPT) 22.3-6.8 MG/ML ophthalmic solution Place 1 drop into both eyes 2 (two) times daily.     HYDROcodone -acetaminophen  (NORCO/VICODIN) 5-325 MG tablet Take 1 tablet by mouth every 6 (six) hours as needed for moderate pain (pain score 4-6). 10 tablet 0   latanoprost (XALATAN) 0.005 % ophthalmic solution 1 drop at bedtime.     levothyroxine  (SYNTHROID ) 75 MCG tablet Take 1 tablet (75 mcg total) by mouth daily. 90 tablet 3   lisinopril  (ZESTRIL ) 40 MG tablet Take 1 tablet (40 mg total) by mouth daily. 90 tablet 3   pantoprazole  (PROTONIX ) 40 MG tablet TAKE 1 TABLET BY MOUTH EVERY DAY 90 tablet 3   rivastigmine  (EXELON ) 1.5 MG capsule Take 1 capsule (1.5 mg total) by mouth 2 (two) times daily. 180 capsule 3   rosuvastatin  (CRESTOR ) 10 MG tablet Take 1 tablet (10 mg total) by mouth daily. 90 tablet 3   sertraline  (ZOLOFT ) 25 MG tablet Take 1 tablet (25 mg total) by mouth daily. 90 tablet 1   timolol (TIMOPTIC) 0.5 % ophthalmic solution Place 1 drop into both eyes 2 (two) times daily.     No current facility-administered medications on file prior to visit.    Review of  Systems  Constitutional:  Negative for chills and fever.  Respiratory:  Negative for cough.   Cardiovascular:  Negative for chest pain and palpitations.  Gastrointestinal:  Negative for nausea and vomiting.  Genitourinary:  Positive for dysuria and hematuria.      Objective:    BP 122/70   Pulse 68   Temp 97.7 F (36.5 C) (Oral)   Ht 5' 2 (1.575 m)   Wt 143 lb (64.9 kg)   LMP 10/21/1984   SpO2 98%   BMI 26.16 kg/m   BP Readings from Last 3 Encounters:  05/06/24 122/70  01/10/24 122/68  12/10/23 130/76   Wt Readings from Last 3 Encounters:  05/06/24 143 lb (64.9 kg)  01/10/24 134 lb 12.8 oz (61.1 kg)  12/10/23 136 lb (61.7 kg)    Physical Exam Vitals reviewed.  Constitutional:      Appearance: She is  well-developed.  Eyes:     Conjunctiva/sclera: Conjunctivae normal.  Cardiovascular:     Rate and Rhythm: Normal rate and regular rhythm.     Pulses: Normal pulses.     Heart sounds: Normal heart sounds.  Pulmonary:     Effort: Pulmonary effort is normal.     Breath sounds: Normal breath sounds. No wheezing, rhonchi or rales.  Skin:    General: Skin is warm and dry.  Neurological:     Mental Status: She is alert.  Psychiatric:        Speech: Speech normal.        Behavior: Behavior normal.        Thought Content: Thought content normal.

## 2024-05-06 NOTE — Patient Instructions (Signed)
 Plenty of water Continue daily yogurt for probiotics.   Pending urine culture  Please start macrobid  ( antibiotic).   Urinary Tract Infection, Female A urinary tract infection (UTI) is an infection in your urinary tract. The urinary tract is made up of organs that make, store, and get rid of pee (urine) in your body. These organs include: The kidneys. The ureters. The bladder. The urethra. What are the causes? Most UTIs are caused by germs called bacteria. They may be in or near your genitals. These germs grow and cause swelling in your urinary tract. What increases the risk? You're more likely to get a UTI if: You're a female. The urethra is shorter in females than in males. You have a soft tube called a catheter that drains your pee. You can't control when you pee or poop. You have trouble peeing because of: A kidney stone. A urinary blockage. A nerve condition that affects your bladder. Not getting enough to drink. You're sexually active. You use a birth control inside your vagina, like spermicide. You're pregnant. You have low levels of the hormone estrogen in your body. You're an older adult. You're also more likely to get a UTI if you have other health problems. These may include: Diabetes. A weak immune system. Your immune system is your body's defense system. Sickle cell disease. Injury of the spine. What are the signs or symptoms? Symptoms may include: Needing to pee right away. Peeing small amounts often. Pain or burning when you pee. Blood in your pee. Pee that smells bad or odd. Pain in your belly or lower back. You may also: Feel confused. This may be the first symptom in older adults. Vomit. Not feel hungry. Feel tired or easily annoyed. Have a fever or chills. How is this diagnosed? A UTI is diagnosed based on your medical history and an exam. You may also have other tests. These may include: Pee tests. Blood tests. Tests for sexually transmitted  infections (STIs). If you've had more than one UTI, you may need to have imaging studies done to find out why you keep getting them. How is this treated? A UTI can be treated by: Taking antibiotics or other medicines. Drinking enough fluid to keep your pee pale yellow. In rare cases, a UTI can cause a very bad condition called sepsis. Sepsis may be treated in the hospital. Follow these instructions at home: Medicines Take your medicines only as told by your health care provider. If you were given antibiotics, take them as told by your provider. Do not stop taking them even if you start to feel better. General instructions Make sure you: Pee often and fully. Do not hold your pee for a long time. Wipe from front to back after you pee or poop. Use each tissue only once when you wipe. Pee after you have sex. Do not douche or use sprays or powders in your genital area. Contact a health care provider if: Your symptoms don't get better after 1-2 days of taking antibiotics. Your symptoms go away and then come back. You have a fever or chills. You vomit or feel like you may vomit. Get help right away if: You have very bad pain in your back or lower belly. You faint. This information is not intended to replace advice given to you by your health care provider. Make sure you discuss any questions you have with your health care provider. Document Revised: 09/19/2023 Document Reviewed: 01/12/2023 Elsevier Patient Education  2025 [employer].

## 2024-05-06 NOTE — Telephone Encounter (Signed)
 FYI Only or Action Required?: Action required by provider: request for appointment.  Patient was last seen in primary care on 01/10/2024 by Glendia Shad, MD.  Called Nurse Triage reporting No chief complaint on file..  Symptoms began yesterday.  Interventions attempted: Nothing.  Symptoms are: unchanged.  Triage Disposition: See HCP Within 4 Hours (Or PCP Triage)  Patient/caregiver understands and will follow disposition?: YesCopied from CRM [id number]. Topic: Clinical - Red Word Triage >> May 06, 2024  8:50 AM Viola F wrote: Patient experiencing burning urination, blood in urine and back pain - request to be tested for UTI Reason for Disposition  Side (flank) or lower back pain present  Answer Assessment - Initial Assessment Questions 1. SYMPTOM: What's the main symptom you're concerned about? (e.g., frequency, incontinence)     Blood in urine 2. ONSET: When did the    start?     Yesterday  3. PAIN: Is there any pain? If Yes, ask: How bad is it? (Scale: 1-10; mild, moderate, severe)     Pain in back 4. CAUSE: What do you think is causing the symptoms?     UTI 5. OTHER SYMPTOMS: Do you have any other symptoms? (e.g., blood in urine, fever, flank pain, pain with urination)     Burns while urinating  Protocols used: Urinary Symptoms-A-AH

## 2024-05-06 NOTE — Assessment & Plan Note (Signed)
 Urine positive leukocytes , blood. Concern for UTI. Start macrobid  while we await culture. She will let me know how she is doing.

## 2024-05-07 LAB — URINE CULTURE
MICRO NUMBER:: 16700918
SPECIMEN QUALITY:: ADEQUATE

## 2024-05-08 ENCOUNTER — Ambulatory Visit

## 2024-05-08 DIAGNOSIS — R2689 Other abnormalities of gait and mobility: Secondary | ICD-10-CM | POA: Diagnosis present

## 2024-05-08 DIAGNOSIS — M6281 Muscle weakness (generalized): Secondary | ICD-10-CM | POA: Diagnosis present

## 2024-05-08 DIAGNOSIS — R2681 Unsteadiness on feet: Secondary | ICD-10-CM

## 2024-05-08 DIAGNOSIS — R262 Difficulty in walking, not elsewhere classified: Secondary | ICD-10-CM | POA: Diagnosis present

## 2024-05-08 DIAGNOSIS — R278 Other lack of coordination: Secondary | ICD-10-CM | POA: Diagnosis present

## 2024-05-08 NOTE — Therapy (Signed)
 OUTPATIENT PHYSICAL THERAPY BALANCE TREATMENT   Patient Name: Crystal Lynch MRN: [id number] DOB:1941/01/13, 83 y.o., female Today's Date: 05/08/2024  END OF SESSION:  PT End of Session - 05/08/24 0852     Visit Number 31    Number of Visits 57    Date for PT Re-Evaluation 05/29/24    Authorization Type eval: 11/19/23    PT Start Time 0847    PT Stop Time 0930    PT Time Calculation (min) 43 min    Equipment Utilized During Treatment Gait belt    Activity Tolerance Patient tolerated treatment well    Behavior During Therapy WFL for tasks assessed/performed          Past Medical History:  Diagnosis Date   Arthritis    Chicken pox    Endometriosis    Esophagitis    s/p esophageal dilatation   GERD (gastroesophageal reflux disease)    History of ovarian cyst    Hypercholesterolemia    Hypertension    Hypothyroidism    Past Surgical History:  Procedure Laterality Date   ABDOMINAL SURGERY     found to have an ovarian cyst and endometriosis   APPENDECTOMY     Patient Active Problem List   Diagnosis Date Noted   Low back pain 12/10/2023   Postnasal drip 12/10/2023   Bursitis of right hip 09/12/2023   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   Stress 07/27/2022   Unsteady gait 07/10/2022   Hypophosphatemia 06/11/2022   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   OSA (obstructive sleep apnea) 06/09/2022   Kidney lesion 12/14/2021   Left hip pain 12/13/2021   Memory change 11/27/2021   Fatigue 07/23/2021   Aortic atherosclerosis (HCC) 04/17/2021   Right acute serous otitis media    Hyponatremia 02/23/2021   Prolonged QT interval 02/23/2021   Dizziness 09/12/2020   Fall 03/08/2020   Fatty liver 12/28/2019   Chest pain 07/25/2019   Frequent urinary tract infections 12/24/2018   Dysuria 10/11/2018   Hypercalcemia 01/09/2017   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   Sleeping difficulties 03/08/2015   Health care maintenance 01/10/2015   Osteopenia 08/10/2014    Hyperbilirubinemia 10/27/2012   Hematuria 10/27/2012   Hypertension 10/21/2012   Hypercholesterolemia 10/21/2012   Hypothyroidism 10/21/2012   Esophagitis 10/21/2012    PCP: Glendia Shad, MD  REFERRING PROVIDER: Glendia Shad, MD   REFERRING DIAG: R26.81 (ICD-10-CM) - Unsteady gait   RATIONALE FOR EVALUATION AND TREATMENT: Rehabilitation  THERAPY DIAG: Unsteadiness on feet  Other lack of coordination  Difficulty in walking, not elsewhere classified  ONSET DATE: Chronic x years  FOLLOW-UP APPT SCHEDULED WITH REFERRING PROVIDER: Yes   FROM INITIAL EVALUATION SUBJECTIVE:  SUBJECTIVE STATEMENT:  Unsteadiness  PERTINENT HISTORY:  Pt referred to PT for unsteadiness and dizziness. History today obtained from both patient and husband. I'm dizzy all the time. She denies vertigo but reports persistent unsteadiness with occasional lightheadedness. Pt has a history of NPH. She underwent a shunt trial at [facility] but there was not enough change in her testing to merit a permanent shunt placement. She is currently being followed by [facility] neurology and has a follow-up appointment this Wednesday. 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 She had a alpha-synuclein skin biopsy, EMG/NCS, echo, and CTA head and neck. 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 Recommended baby aspirin  and CTA 6 months. 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 No evidence of motor neuron disease.  Pt reports shuffling gait and freezing episodes but no tremor. She was started on carbidopa-levodopa but has not noticed any change in her symptoms since starting. She uses a rollator at home and front wheeled walker when out in the community. Pt wears eyeglasses all the time and has an upcoming  check-up with optometry. History of occasional sinus headaches but no history of migraines. Pt reports some L hip weakness.  Pain: Yes, L hip pain Numbness/Tingling: No Focal Weakness: Yes, L hip weakness related to pain; Recent changes in overall health/medication: Yes, carvidopa/levodopa Prior history of physical therapy for balance:  Yes Dominant hand: right Red flags: no personal history of cancer, abdominal pain, chills/fever, night sweats, nausea, vomiting, unrelenting pain  PRECAUTIONS: None  WEIGHT BEARING RESTRICTIONS: No  FALLS: Has patient fallen in last 6 months? Yes. Number of falls 7-8, Directional pattern for falls: Yes Backwards , often occurs when turning.   Living Environment Lives with: lives with their spouse Lives in: House/apartment Stairs: 2 to enter, railing on left during ascend Has following equipment at home: [facility] over commode, walk-in shower with seat and grab bars. Wheelchair for extended distances.   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   Hobbies: Enjoys crafting, reading.   Patient Goals: Build confidence in her balance   OBJECTIVE:   Patient Surveys  ABC: 40%  Cognition Patient is oriented to person, place, and time.  Recent memory is grossly intact however some memory deficit is evident. Remote memory is intact.  Attention span and concentration are intact.  Expressive speech is intact.  Patient's fund of knowledge is within normal limits for educational level.    Gross Musculoskeletal Assessment Tremor: None Bulk: Normal Tone: Normal  Posture: No gross abnormalities noted in standing or seated posture with the exception of some mild forward head.  AROM Deferred  LE MMT: MMT (out of 5) Right  Left   Hip flexion 5 4+  Hip extension    Hip abduction (seated) 4+ 4  Hip adduction 4+ 4  Hip internal rotation    Hip external rotation     Knee flexion (seated) 5 5  Knee extension 5 5  Ankle dorsiflexion 5 5  (* = pain; Blank rows = not tested)  Sensation Deferred  Reflexes Deferred  Cranial Nerves Deferred  Coordination/Cerebellar Finger to Nose: Mild dysmetria LUE Heel to Shin: Deferred Rapid alternating movements: Abnormal LUE Finger Opposition: WNL Pronator Drift: Negative  Bed mobility:Deferred  Transfers: Assistive device utilized: Environmental consultant - 2 wheeled  Sit to stand: Modified independence Stand to sit: Modified independence Chair to chair: Modified independence Floor: Deferred  Curb:  Deferred  Stairs: Level  of Assistance: Modified independence Stair Negotiation Technique: Step to Pattern with Bilateral Rails Number of Stairs: 4  Height of Stairs: 6  Comments: Pt moves slowly and leans backwards during descent  Gait: Gait pattern: step through pattern, decreased step length- Right, decreased step length- Left, and shuffling Distance walked: 100' Assistive device utilized: Environmental consultant - 2 wheeled Level of assistance: Modified independence Comments: ataxic gait with shuffling steps;  mCTSIB: unable to complete, pt only able to stand for approximately 2-3s in condition 1;  Orthostatic vitals (11/26/23): Supine: BP: 151/66 mmHg, HR: 66 bpm, SpO2: 97% Seated: BP: 151/58 mmHg, HR: 68 bpm, SpO2: 97% Standing (1 minute): BP: 144/60 mmHg, HR: 71 bpm, SpO2: 98% Standing (3 minutes): BP: 149/63 mmHg, HR: 69 bpm, SpO2: 99%   BPPV TESTS (11/26/23):  Symptoms Duration Intensity Nystagmus  L Dix-Hallpike Dizziness/lightheadedness   None  R Dix-Hallpike Dizziness/lightheadedness   None  L Head Roll None   None  R Head Roll None   None  L Sidelying Test      R Sidelying Test      (blank = not tested)  Functional Outcome Measures  Results 11/26/23 Comments  BERG 19/56  High fall risk  DGI  8/24   FGA     TUG 19.3 seconds  Increased fall risk  5TSTS 19.7 seconds  Increased fall risk  6 Minute Walk Test      10 Meter Gait Speed Self-selected: 15.5s = 0.65 m/s; Fastest: 12.5s = 0.8 m/s  Below functional community ambulation speeds  (Blank rows = not tested)   Updated Outcome Measures 04/17/24:   Results from initial eval on 11/19/23  Results from today 04/17/24 Comments  BERG 19/56 25/56 High fall risk  DGI 8/24      FGA        TUG 19.3 seconds 19.38 without RW, 22.41 with RW Increased fall risk  5TSTS 19.7 seconds 26.76 seconds Increased fall risk  6 Minute Walk Test        10 Meter Gait Speed Self-selected: 15.5s = 0.65 m/s; Fastest: 12.5s = 0.8 m/s Self-selected: 19.49s = 0.51 m/s; Fastest: 16.73s = 0.6 m/s Below functional community ambulation speeds  ABC  40% 43.1%    (Blank rows = not tested)  Updated Outcome Measures 05/01/24:   Results from initial eval on 11/19/23  Results from 04/17/24 Results from today 05/01/24 Comments  BERG 19/56 25/56  High fall risk  DGI 8/24       FGA         TUG 19.3 seconds 19.38 without RW, 22.41 with RW 17.36 without RW, 21.96 with RW Increased fall risk  5TSTS 19.7 seconds 26.76 seconds 16.81 seconds Increased fall risk  6 Minute Walk Test         10 Meter Gait Speed Self-selected: 15.5s = 0.65 m/s; Fastest: 12.5s = 0.8 m/s Self-selected: 19.49s = 0.51 m/s; Fastest: 16.73s = 0.6 m/s  Below functional community ambulation speeds  ABC  40% 43.1% 52.8%    (Blank rows = not tested)   TODAY'S TREATMENT 05/08/24   SUBJECTIVE: Pt reports that she is moving a little slow today. Pt reports having a UTI which is currently being treated with antibiotics. No falls since the last therapy session.  No specific pain reported upon arrival today.    PAIN: Denies   Therapeutic Activity NuStep L0-4 (seat 8, arms 9) x 10 minutes (5 mins unbilled) at start of session for warm-up, BLE strengthening, and interval history. Therapist adjusting resistance  to modify challenge;  Sit to stand 2 x 10;  Extensive ambulation inside and outside challenging patient on  inclines, declines, sideslopes, and ascending/descending curb. Pt struggles whenever she first approaches an uneven or different surface.  Verbal cues to decrease cadence and increase step length. Utilized metronome but minimally effective today.   Practice curb step-up/down x multiple trials without UE support and CGA from therapist;   Neuromuscular Re-ed Seated large amplitude reaching forward, down, up, and out x multiple bouts; Seated trunk rotation with lateral large amplitude reaching across body x multiple bouts; Standing large amplitude forward lunges with chest opening reaching x multiple bouts;   PATIENT EDUCATION:  Education details: Outcome measures Person educated: Patient Education method: Explanation and Verbal cues Education comprehension: verbalized understanding, returned demonstration, and verbal cues required   HOME EXERCISE PROGRAM:  Access Code: [email or web address] URL: [email or web address] Date: 03/11/2024 Prepared by: Selinda Eck  Exercises - Seated Hip Abduction with Resistance  - 1 x daily - 7 x weekly - 2 sets - 10 reps - 3s hold - Seated March with Resistance  - 1 x daily - 7 x weekly - 2 sets - 10 reps - 3s hold - Seated Hip Adduction Isometrics with Ball  - 1 x daily - 7 x weekly - 2 sets - 10 reps - 3s hold - Seated Long Arc Quad  - 1 x daily - 7 x weekly - 2 sets - 10 reps - 3s hold - Mini Squats with Walker and Chair  - 1 x daily - 7 x weekly - 2 sets - 10 reps   ASSESSMENT:  CLINICAL IMPRESSION: Repeated dynamic gait/balance training with continued focus on longer step lengths during gait. Pt still struggled with gait activities with festination and several LOB but retropulsion was less frequent with less intensity this session evidenced by decreased physical assistance given by SPT/DPT.  Repeated gait outside across a variety of inclines/declines/curbs. Continued to utilize external verbal and visual cues to improve step length and cadence.  Pt struggled this session to self-correct festinating gait after surface changes despite repeat verbal cues by SPT. Plan to progress balance and strength exercises at future sessions. Pt encouraged to follow-up as scheduled. She will benefit from PT services to address deficits in strength, balance, and mobility in order to return to full function at home and decrease her risk for falls.    OBJECTIVE IMPAIRMENTS: decreased balance and decreased strength.   ACTIVITY LIMITATIONS: standing, squatting, stairs, and transfers  PARTICIPATION LIMITATIONS: meal prep, cleaning, laundry, shopping, and community activity  PERSONAL FACTORS: Age, Past/current experiences, Time since onset of injury/illness/exacerbation, and 3+ comorbidities: memory change, fatigue, and NPH are also affecting patient's functional outcome.   REHAB POTENTIAL: Fair    CLINICAL DECISION MAKING: Unstable/unpredictable  EVALUATION COMPLEXITY: High   GOALS: Goals reviewed with patient? Yes  SHORT TERM GOALS: Target date: 04/01/24  Pt will be independent with HEP in order to improve strength and balance in order to decrease fall risk and improve function at home. Baseline: HEP provided  Goal status: IN progress  LONG TERM GOALS: Target date: 05/29/2024   1.  Pt will improve BERG by at least 3 points in order to demonstrate clinically significant improvement in balance.   Baseline: 19/56; 01/15/24: 19/56; 20/56 on 5/1; 03/11/24: 19/56; 04/17/24: 25/56;  Goal status: ACHIEVED   2.  Pt will improve ABC by at least 13% in order to demonstrate clinically significant improvement in balance confidence.      Baseline:  40%, 01/15/24: 65%; 03/11/24: 60.6%; 04/17/24: 43.1%; 05/01/24: 52.8% Goal status: ACHIEVED, PROGRESSED FROM LAST UPDATE   3. Pt will decrease 5TSTS by at least 3 seconds in order to demonstrate clinically significant improvement in LE strength      Baseline: 19.7s, 01/15/24: 15.9s; 03/11/24: 17.3s; 04/17/24: 26.76s;  05/01/24: 16.81s  Goal status: ACHIEVED   4. Pt will decrease TUG to below 14 seconds/decrease in order to demonstrate decreased fall risk.  Baseline: 19.3s, 01/15/24: 20.7s; 5/1 24.4secs, 03/11/24: 17.2s; 04/17/24: 19.38s, 05/01/24: 17.36s Goal status: PARTIALLY MET   PLAN: PT FREQUENCY: 1-2x/week  PT DURATION: 6 weeks  PLANNED INTERVENTIONS: Therapeutic exercises, Therapeutic activity, Neuromuscular re-education, Balance training, Gait training, Patient/Family education, Self Care, Joint mobilization, Joint manipulation, Vestibular training, Canalith repositioning, Orthotic/Fit training, DME instructions, Dry Needling, Electrical stimulation, Spinal manipulation, Spinal mobilization, Cryotherapy, Moist heat, Taping, Traction, Ultrasound, Ionotophoresis 4mg /ml Dexamethasone, Manual therapy, and Re-evaluation.  PLAN FOR NEXT SESSION: Progress balance and strength exercises, review/modify HEP as needed;   Vernell Moats, SPT Selinda BIRCH Huprich PT, DPT, GCS  Huprich,Jason  05/08/2024, 2:01 PM

## 2024-05-09 ENCOUNTER — Ambulatory Visit: Payer: Self-pay | Admitting: Family

## 2024-05-09 DIAGNOSIS — E7800 Pure hypercholesterolemia, unspecified: Secondary | ICD-10-CM

## 2024-05-09 NOTE — Telephone Encounter (Signed)
 Noted. Fasting labs ordered for upcoming lab appt.

## 2024-05-15 ENCOUNTER — Ambulatory Visit: Admitting: Nurse Practitioner

## 2024-05-15 ENCOUNTER — Ambulatory Visit

## 2024-05-19 ENCOUNTER — Ambulatory Visit (INDEPENDENT_AMBULATORY_CARE_PROVIDER_SITE_OTHER): Admitting: *Deleted

## 2024-05-19 VITALS — Ht 62.0 in | Wt 131.5 lb

## 2024-05-19 DIAGNOSIS — Z1231 Encounter for screening mammogram for malignant neoplasm of breast: Secondary | ICD-10-CM

## 2024-05-19 DIAGNOSIS — Z0000 Encounter for general adult medical examination without abnormal findings: Secondary | ICD-10-CM | POA: Diagnosis not present

## 2024-05-19 NOTE — Progress Notes (Signed)
 Subjective:   Crystal Lynch is a 83 y.o. who presents for a Medicare Wellness preventive visit.  As a reminder, Annual Wellness Visits don't include a physical exam, and some assessments may be limited, especially if this visit is performed virtually. We may recommend an in-person follow-up visit with your provider if needed.  Visit Complete: Virtual I connected with  Crystal Lynch on 05/19/24 by a audio enabled telemedicine application and verified that I am speaking with the correct person using two identifiers.  Patient Location: Home  Provider Location: Home Office  I discussed the limitations of evaluation and management by telemedicine. The patient expressed understanding and agreed to proceed.  Vital Signs: Because this visit was a virtual/telehealth visit, some criteria may be missing or patient reported. Any vitals not documented were not able to be obtained and vitals that have been documented are patient reported.  VideoDeclined- This patient declined Librarian, academic. Therefore the visit was completed with audio only.  Persons Participating in Visit: Patient.  AWV Questionnaire: No: Patient Medicare AWV questionnaire was not completed prior to this visit.  Cardiac Risk Factors include: advanced age (>38men, >67 women);dyslipidemia;hypertension     Objective:    Today's Vitals   05/19/24 1429  Weight: 131 lb 8 oz (59.6 kg)  Height: 5' 2 (1.575 m)   Body mass index is 24.05 kg/m.     05/19/2024    2:48 PM 05/26/2023   11:20 AM 05/09/2023    1:51 PM 06/07/2022    7:56 PM 04/28/2022    9:12 AM 11/24/2021    2:08 PM 03/15/2021   10:06 AM  Advanced Directives  Does Patient Have a Medical Advance Directive? Yes Yes Yes No Yes Yes Yes  Type of Estate agent of [place];Living will Healthcare Power of [place];Living will Healthcare Power of [place];Living will  Healthcare Power of [place];Living will  Healthcare  Power of [place];Living will  Does patient want to make changes to medical advance directive?     No - Patient declined No - Patient declined No - Patient declined  Copy of Healthcare Power of Attorney in Chart? No - copy requested  No - copy requested  No - copy requested  No - copy requested  Would patient like information on creating a medical advance directive?    No - Patient declined       Current Medications (verified) Outpatient Encounter Medications as of 05/19/2024  Medication Sig   acetaminophen  (TYLENOL ) 500 MG tablet Take 1 tablet (500 mg total) by mouth every 6 (six) hours as needed for mild pain (or Fever >/= 101).   amLODipine  (NORVASC ) 5 MG tablet Take 1 tablet (5 mg total) by mouth daily.   aspirin  EC 81 MG tablet Take 1 tablet (81 mg total) by mouth daily. Swallow whole.   azelastine  (ASTELIN ) 0.1 % nasal spray Place 2 sprays into both nostrils 2 (two) times daily. Use in each nostril as directed   Biotin 5 MG CAPS Take 5,000 mcg by mouth daily.   carvedilol  (COREG ) 12.5 MG tablet Take 1 tablet (12.5 mg total) by mouth 2 (two) times daily with a meal.   latanoprost (XALATAN) 0.005 % ophthalmic solution 1 drop at bedtime.   levothyroxine  (SYNTHROID ) 75 MCG tablet Take 1 tablet (75 mcg total) by mouth daily.   lisinopril  (ZESTRIL ) 40 MG tablet Take 1 tablet (40 mg total) by mouth daily.   pantoprazole  (PROTONIX ) 40 MG tablet TAKE 1 TABLET BY MOUTH  EVERY DAY   rivastigmine  (EXELON ) 1.5 MG capsule Take 1 capsule (1.5 mg total) by mouth 2 (two) times daily.   rosuvastatin  (CRESTOR ) 10 MG tablet Take 1 tablet (10 mg total) by mouth daily.   sertraline  (ZOLOFT ) 25 MG tablet Take 1 tablet (25 mg total) by mouth daily.   dorzolamide-timolol (COSOPT) 22.3-6.8 MG/ML ophthalmic solution Place 1 drop into both eyes 2 (two) times daily. (Patient not taking: Reported on 05/19/2024)   HYDROcodone -acetaminophen  (NORCO/VICODIN) 5-325 MG tablet Take 1 tablet by mouth every 6 (six) hours as  needed for moderate pain (pain score 4-6). (Patient not taking: Reported on 05/19/2024)   timolol (TIMOPTIC) 0.5 % ophthalmic solution Place 1 drop into both eyes 2 (two) times daily.   [DISCONTINUED] nitrofurantoin , macrocrystal-monohydrate, (MACROBID ) 100 MG capsule Take 1 capsule (100 mg total) by mouth 2 (two) times daily. Take with food.   No facility-administered encounter medications on file as of 05/19/2024.    Allergies (verified) Meloxicam   History: Past Medical History:  Diagnosis Date   Arthritis    Chicken pox    Endometriosis    Esophagitis    s/p esophageal dilatation   GERD (gastroesophageal reflux disease)    History of ovarian cyst    Hypercholesterolemia    Hypertension    Hypothyroidism    Past Surgical History:  Procedure Laterality Date   ABDOMINAL SURGERY     found to have an ovarian cyst and endometriosis   APPENDECTOMY     Family History  Problem Relation Age of Onset   Lung disease Father    Heart attack Father    Heart disease Mother        myocardial infarction   Breast cancer Mother 45   Heart attack Mother    Hypertension Mother    Hyperlipidemia Mother    Breast cancer Sister 78   Hypertension Sister    Rheum arthritis Sister    Breast cancer Maternal Aunt 50   Hypertension Brother    Colon cancer Neg Hx    Social History   Socioeconomic History   Marital status: Married    Spouse name: Not on file   Number of children: Not on file   Years of education: Not on file   Highest education level: Associate degree: occupational, Scientist, product/process development, or vocational program  Occupational History   Not on file  Tobacco Use   Smoking status: Never   Smokeless tobacco: Never  Vaping Use   Vaping status: Never Used  Substance and Sexual Activity   Alcohol use: No    Alcohol/week: 0.0 standard drinks of alcohol   Drug use: No   Sexual activity: Never  Other Topics Concern   Not on file  Social History Narrative   Married   Social Drivers  of Health   Financial Resource Strain: Low Risk  (05/19/2024)   Overall Financial Resource Strain (CARDIA)    Difficulty of Paying Living Expenses: Not hard at all  Food Insecurity: No Food Insecurity (05/19/2024)   Hunger Vital Sign    Worried About Running Out of Food in the Last Year: Never true    Ran Out of Food in the Last Year: Never true  Transportation Needs: No Transportation Needs (05/19/2024)   PRAPARE - Administrator, Civil Service (Medical): No    Lack of Transportation (Non-Medical): No  Physical Activity: Inactive (05/19/2024)   Exercise Vital Sign    Days of Exercise per Week: 0 days    Minutes of  Exercise per Session: 0 min  Stress: No Stress Concern Present (05/19/2024)   [employer] of Occupational Health - Occupational Stress Questionnaire    Feeling of Stress: Not at all  Social Connections: Socially Integrated (05/19/2024)   Social Connection and Isolation Panel    Frequency of Communication with Friends and Family: More than three times a week    Frequency of Social Gatherings with Friends and Family: More than three times a week    Attends Religious Services: More than 4 times per year    Active Member of [employer] or Organizations: Yes    Attends Engineer, structural: More than 4 times per year    Marital Status: Married    Tobacco Counseling Counseling given: Not Answered    Clinical Intake:  Pre-visit preparation completed: Yes  Pain : No/denies pain     BMI - recorded: 24.05 Nutritional Status: BMI of 19-24  Normal Nutritional Risks: None Diabetes: No  No results found for: HGBA1C   How often do you need to have someone help you when you read instructions, pamphlets, or other written materials from your doctor or pharmacy?: 1 - Never  Interpreter Needed?: No  Information entered by :: R. Jonathon Tan LPN   Activities of Daily Living     05/19/2024    2:32 PM 05/09/2024    4:26 PM  In your present state of health, do  you have any difficulty performing the following activities:  Hearing? 0 0   Comment no issues   Vision? 0 0   Comment glasses   Difficulty concentrating or making decisions? 1 1   Walking or climbing stairs? 1 1   Dressing or bathing? 0 0   Doing errands, shopping? 1 1   Preparing Food and eating ? N N   Using the Toilet? N N   In the past six months, have you accidently leaked urine? Y Y   Do you have problems with loss of bowel control? N N   Managing your Medications? Y Y   Managing your Finances? Y   Housekeeping or managing your Housekeeping? LOISE LOISE      Proxy-reported    Patient Care Team: Glendia Shad, MD as PCP - General (Internal Medicine) Darron Deatrice LABOR, MD as PCP - Cardiology (Cardiology)  I have updated your Care Teams any recent Medical Services you may have received from other providers in the past year.     Assessment:   This is a routine wellness examination for [place].  Hearing/Vision screen Hearing Screening - Comments:: No issues Vision Screening - Comments:: glasses   Goals Addressed             This Visit's Progress    Patient Stated       Wants to get out more and be more sociable and be able to start back driving        Depression Screen     05/19/2024    2:42 PM 05/06/2024   10:29 AM 01/10/2024    2:55 PM 06/29/2023    3:32 PM 05/09/2023    1:43 PM 11/24/2022    4:18 PM 10/30/2022    4:04 PM  PHQ 2/9 Scores  PHQ - 2 Score 0 0 0 0 0 0 0  PHQ- 9 Score 0  0 0 1 1     Fall Risk     05/19/2024    2:35 PM 05/09/2024    4:26 PM 05/06/2024   10:29 AM 01/10/2024  2:54 PM 06/29/2023    3:32 PM  Fall Risk   Falls in the past year? 1 1  0 1 1  Number falls in past yr: 1 1  0 1 1  Injury with Fall? 1 1  0 0 0  Risk for fall due to : History of fall(s);Impaired balance/gait  No Fall Risks History of fall(s);Impaired balance/gait History of fall(s)  Follow up Falls evaluation completed;Falls prevention discussed  Falls evaluation completed Falls  evaluation completed Falls evaluation completed     Proxy-reported    MEDICARE RISK AT HOME:  Medicare Risk at Home Any stairs in or around the home?: Yes If so, are there any without handrails?: No Home free of loose throw rugs in walkways, pet beds, electrical cords, etc?: Yes Adequate lighting in your home to reduce risk of falls?: Yes Life alert?: No Use of a cane, walker or w/c?: Yes Grab bars in the bathroom?: Yes Shower chair or bench in shower?: Yes Elevated toilet seat or a handicapped toilet?: Yes  TIMED UP AND GO:  Was the test performed?  No  Cognitive Function: 6CIT completed    03/01/2018    4:50 PM 02/28/2017    4:39 PM  MMSE - Mini Mental State Exam  Orientation to time 5 5   Orientation to Place 5 5   Registration 3 3   Attention/ Calculation 5 5   Recall 3 3   Language- name 2 objects 2 2   Language- repeat 1 1  Language- follow 3 step command 3 3   Language- read & follow direction 1 1   Write a sentence 1 1   Copy design 1 1   Total score 30 30      Data saved with a previous flowsheet row definition        05/19/2024    2:49 PM 05/09/2023    1:52 PM 03/15/2021   10:10 AM 03/05/2020   10:04 AM 03/03/2019   11:09 AM  6CIT Screen  What Year? 0 points 0 points 0 points 0 points 0 points  What month? 0 points 0 points 0 points 0 points 0 points  What time? 0 points 0 points 0 points 0 points 0 points  Count back from 20 0 points 0 points 0 points  0 points  Months in reverse 4 points 0 points 0 points 0 points 0 points  Repeat phrase 4 points 0 points  0 points 0 points  Total Score 8 points 0 points   0 points    Immunizations Immunization History  Administered Date(s) Administered   Fluad Quad(high Dose 65+) 07/04/2022   Influenza, High Dose Seasonal PF 07/10/2017, 07/01/2018, 07/22/2021, 07/02/2023   Influenza-Unspecified 06/23/2013, 07/12/2015, 07/04/2016, 06/25/2019, 07/06/2020   PFIZER(Purple Top)SARS-COV-2 Vaccination 10/27/2019,  11/17/2019, 12/17/2019, 08/24/2020   Pneumococcal Conjugate-13 08/04/2014   Pneumococcal Polysaccharide-23 02/28/2017   Tdap 03/03/2020   Zoster Recombinant(Shingrix) 02/10/2021, 06/15/2021    Screening Tests Health Maintenance  Topic Date Due   COVID-19 Vaccine (5 - 2024-25 season) 06/24/2023   Medicare Annual Wellness (AWV)  05/08/2024   MAMMOGRAM  05/21/2024   INFLUENZA VACCINE  05/23/2024   DTaP/Tdap/Td (2 - Td or Tdap) 03/03/2030   Pneumococcal Vaccine: 50+ Years  Completed   DEXA SCAN  Completed   Zoster Vaccines- Shingrix  Completed   Hepatitis B Vaccines  Aged Out   HPV VACCINES  Aged Out   Meningococcal B Vaccine  Aged Out    Health Maintenance  Health  Maintenance Due  Topic Date Due   COVID-19 Vaccine (5 - 2024-25 season) 06/24/2023   Medicare Annual Wellness (AWV)  05/08/2024   Health Maintenance Items Addressed: Mammogram ordered Patient declines covid vaccines  Additional Screening:  Vision Screening: Recommended annual ophthalmology exams for early detection of glaucoma and other disorders of the eye.Up to date [facility] Would you like a referral to an eye doctor? No    Dental Screening: Recommended annual dental exams for proper oral hygiene  Community Resource Referral / Chronic Care Management: CRR required this visit?  No   CCM required this visit?  No   Plan:    I have personally reviewed and noted the following in the patient's chart:   Medical and social history Use of alcohol, tobacco or illicit drugs  Current medications and supplements including opioid prescriptions. Patient is not currently taking opioid prescriptions. Functional ability and status Nutritional status Physical activity Advanced directives List of other physicians Hospitalizations, surgeries, and ER visits in previous 12 months Vitals Screenings to include cognitive, depression, and falls Referrals and appointments  In addition, I have reviewed and discussed  with patient certain preventive protocols, quality metrics, and best practice recommendations. A written personalized care plan for preventive services as well as general preventive health recommendations were provided to patient.   Angeline Fredericks, LPN   2/71/7974   After Visit Summary: (MyChart) Due to this being a telephonic visit, the after visit summary with patients personalized plan was offered to patient via MyChart   Notes: Nothing significant to report at this time.

## 2024-05-19 NOTE — Patient Instructions (Addendum)
 Crystal Lynch , Thank you for taking time out of your busy schedule to complete your Annual Wellness Visit with me. I enjoyed our conversation and look forward to speaking with you again next year. I, as well as your care team,  appreciate your ongoing commitment to your health goals. Please review the following plan we discussed and let me know if I can assist you in the future. Your Game plan/ To Do List    Referrals: If you haven't heard from the office you've been referred to, please reach out to them at the phone provided.  Order has been placed for your mammogram You have an order for:  []   2D Mammogram  [x]   3D Mammogram  []   Bone Density     Please call for appointment:  [facility]  [address] [place] [address] [phone]    Make sure to wear two-piece clothing.  No lotions, powders, or deodorants the day of the appointment. Make sure to bring picture ID and insurance card.  Bring list of medications you are currently taking including any supplements.    Follow up Visits: Next Medicare AWV with our clinical staff: 05/22/25 @ 11:30   Have you seen your provider in the last 6 months (3 months if uncontrolled diabetes)? Yes Next Office Visit with your provider: 05/27/24  Clinician Recommendations:  Aim for 30 minutes of exercise or brisk walking, 6-8 glasses of water, and 5 servings of fruits and vegetables each day.       This is a list of the screening recommended for you and due dates:  Health Maintenance  Topic Date Due   COVID-19 Vaccine (5 - 2024-25 season) 06/24/2023   Mammogram  05/21/2024   Flu Shot  05/23/2024   Medicare Annual Wellness Visit  05/19/2025   DTaP/Tdap/Td vaccine (2 - Td or Tdap) 03/03/2030   Pneumococcal Vaccine for age over 80  Completed   DEXA scan (bone density measurement)  Completed   Zoster (Shingles) Vaccine  Completed   Hepatitis B Vaccine  Aged Out   HPV Vaccine  Aged Out    Meningitis B Vaccine  Aged Out    Advanced directives: (Copy Requested) Please bring a copy of your health care power of attorney and living will to the office to be added to your chart at your convenience. You can mail to [facility] [address] [place], [place] [address] or email to [email or web address] Advance Care Planning is important because it:  [x]  Makes sure you receive the medical care that is consistent with your values, goals, and preferences  [x]  It provides guidance to your family and loved ones and reduces their decisional burden about whether or not they are making the right decisions based on your wishes.

## 2024-05-22 ENCOUNTER — Other Ambulatory Visit (INDEPENDENT_AMBULATORY_CARE_PROVIDER_SITE_OTHER)

## 2024-05-22 DIAGNOSIS — E7800 Pure hypercholesterolemia, unspecified: Secondary | ICD-10-CM | POA: Diagnosis not present

## 2024-05-22 LAB — BASIC METABOLIC PANEL WITH GFR
BUN: 12 mg/dL (ref 6–23)
CO2: 29 meq/L (ref 19–32)
Calcium: 10.2 mg/dL (ref 8.4–10.5)
Chloride: 105 meq/L (ref 96–112)
Creatinine, Ser: 0.75 mg/dL (ref 0.40–1.20)
GFR: 73.09 mL/min (ref >60.00)
Glucose, Bld: 119 mg/dL — ABNORMAL HIGH (ref 70–99)
Potassium: 4.1 meq/L (ref 3.5–5.1)
Sodium: 141 meq/L (ref 135–145)

## 2024-05-22 LAB — TSH: TSH: 0.28 u[IU]/mL — ABNORMAL LOW (ref 0.35–5.50)

## 2024-05-22 LAB — HEPATIC FUNCTION PANEL
ALT: 9 U/L (ref 0–35)
AST: 12 U/L (ref 0–37)
Albumin: 4.5 g/dL (ref 3.5–5.2)
Alkaline Phosphatase: 85 U/L (ref 39–117)
Bilirubin, Direct: 0.3 mg/dL (ref 0.0–0.3)
Total Bilirubin: 1.6 mg/dL — ABNORMAL HIGH (ref 0.2–1.2)
Total Protein: 6.5 g/dL (ref 6.0–8.3)

## 2024-05-22 LAB — CBC WITH DIFFERENTIAL/PLATELET
Basophils Absolute: 0 K/uL (ref 0.0–0.1)
Basophils Relative: 0.4 % (ref 0.0–3.0)
Eosinophils Absolute: 0.1 K/uL (ref 0.0–0.7)
Eosinophils Relative: 2.5 % (ref 0.0–5.0)
HCT: 43.1 % (ref 36.0–46.0)
Hemoglobin: 14.3 g/dL (ref 12.0–15.0)
Lymphocytes Relative: 32 % (ref 12.0–46.0)
Lymphs Abs: 1.6 K/uL (ref 0.7–4.0)
MCHC: 33.2 g/dL (ref 30.0–36.0)
MCV: 89.9 fl (ref 78.0–100.0)
Monocytes Absolute: 0.5 K/uL (ref 0.1–1.0)
Monocytes Relative: 9.3 % (ref 3.0–12.0)
Neutro Abs: 2.8 K/uL (ref 1.4–7.7)
Neutrophils Relative %: 55.8 % (ref 43.0–77.0)
Platelets: 225 K/uL (ref 150.0–400.0)
RBC: 4.79 Mil/uL (ref 3.87–5.11)
RDW: 13 % (ref 11.5–15.5)
WBC: 5 K/uL (ref 4.0–10.5)

## 2024-05-22 LAB — LIPID PANEL
Cholesterol: 150 mg/dL (ref 0–200)
HDL: 48.5 mg/dL (ref >39.00)
LDL Cholesterol: 69 mg/dL (ref 0–99)
NonHDL: 101.39
Total CHOL/HDL Ratio: 3
Triglycerides: 161 mg/dL — ABNORMAL HIGH (ref 0.0–149.0)
VLDL: 32.2 mg/dL (ref 0.0–40.0)

## 2024-05-23 ENCOUNTER — Ambulatory Visit: Payer: Self-pay | Admitting: Internal Medicine

## 2024-05-23 DIAGNOSIS — E7800 Pure hypercholesterolemia, unspecified: Secondary | ICD-10-CM

## 2024-05-23 MED ORDER — LEVOTHYROXINE SODIUM 50 MCG PO TABS: 50.0000 ug | ORAL_TABLET | Freq: Every day | ORAL | 1 refills | Status: DC

## 2024-05-27 ENCOUNTER — Encounter: Payer: Self-pay | Admitting: Internal Medicine

## 2024-05-27 ENCOUNTER — Ambulatory Visit (INDEPENDENT_AMBULATORY_CARE_PROVIDER_SITE_OTHER): Admitting: Internal Medicine

## 2024-05-27 VITALS — BP 128/70 | HR 69 | Temp 98.5°F | Resp 20 | Ht 62.0 in | Wt 133.5 lb

## 2024-05-27 DIAGNOSIS — G912 (Idiopathic) normal pressure hydrocephalus: Secondary | ICD-10-CM

## 2024-05-27 DIAGNOSIS — E039 Hypothyroidism, unspecified: Secondary | ICD-10-CM

## 2024-05-27 DIAGNOSIS — F439 Reaction to severe stress, unspecified: Secondary | ICD-10-CM

## 2024-05-27 DIAGNOSIS — K760 Fatty (change of) liver, not elsewhere classified: Secondary | ICD-10-CM

## 2024-05-27 DIAGNOSIS — D219 Benign neoplasm of connective and other soft tissue, unspecified: Secondary | ICD-10-CM | POA: Diagnosis not present

## 2024-05-27 DIAGNOSIS — I10 Essential (primary) hypertension: Secondary | ICD-10-CM

## 2024-05-27 DIAGNOSIS — I700 Atherosclerosis of aorta: Secondary | ICD-10-CM | POA: Diagnosis not present

## 2024-05-27 DIAGNOSIS — R413 Other amnesia: Secondary | ICD-10-CM

## 2024-05-27 DIAGNOSIS — E7800 Pure hypercholesterolemia, unspecified: Secondary | ICD-10-CM

## 2024-05-27 NOTE — Progress Notes (Signed)
 Subjective:    Patient ID: Crystal Lynch, female    DOB: [birth year], 83 y.o.   MRN: [id number]  Patient here for  Chief Complaint  Patient presents with   Medical Management of Chronic Issues    Follow up     HPI Here for a scheduled follow up - follow up regarding hypercholesterolemia, hypothyroidism, NPH, memory change and hypertension. She is accompanied by her husband. Hitory obtained from both of them. Had f/u with neurology at [facility] 03/24/24 - f/u regarding balance impairment, dizziness and memory problems. Taking sinemet. Has been working with PT. Also has been evaluated at [facility] clinic and CSF drainage trial being considered. Discussed with her today. She is not interested at this time. Has seen ENT - diagnosed with left peripheral vestibular hypofunction. Competed vestibular therapy with no relief. She was found to have dilated ventricles on MRI brain. S/p CSF drainage trial at [facility] without significant improvement in her gait or memory. Neuropsych evaluation confirms dementia. Recommended referral to memory clinic. Continue with rivstigmine. Increased simenet. Continue PT. Had 6 month CTA H/N surveillance (completed 02-04-24 at [facility]) of an incidental right upper cervical ICA pseudoaneurysm. no significant change in right pseudoaneurysm and left ICA dissection. Asymptomatic. Continue aspirin  81mg  for stroke protection. Saw Dr Dennise 01/28/24 - f/u angiomyolipoma - stable. Overall stable. Eating. Discussed importance of continuing to try to stay active. Discussed using walker. Breathing stable.    Past Medical History:  Diagnosis Date   Arthritis    Chicken pox    Endometriosis    Esophagitis    s/p esophageal dilatation   GERD (gastroesophageal reflux disease)    History of ovarian cyst    Hypercholesterolemia    Hypertension    Hypothyroidism    Past Surgical History:  Procedure Laterality Date   ABDOMINAL SURGERY     found to have an ovarian cyst and endometriosis    APPENDECTOMY     Family History  Problem Relation Age of Onset   Lung disease Father    Heart attack Father    Heart disease Mother        myocardial infarction   Breast cancer Mother 56   Heart attack Mother    Hypertension Mother    Hyperlipidemia Mother    Breast cancer Sister 3   Hypertension Sister    Rheum arthritis Sister    Breast cancer Maternal Aunt 50   Hypertension Brother    Colon cancer Neg Hx    Social History   Socioeconomic History   Marital status: Married    Spouse name: Not on file   Number of children: Not on file   Years of education: Not on file   Highest education level: Associate degree: occupational, Scientist, product/process development, or vocational program  Occupational History   Not on file  Tobacco Use   Smoking status: Never   Smokeless tobacco: Never  Vaping Use   Vaping status: Never Used  Substance and Sexual Activity   Alcohol use: No    Alcohol/week: 0.0 standard drinks of alcohol   Drug use: No   Sexual activity: Never  Other Topics Concern   Not on file  Social History Narrative   Married   Social Drivers of Health   Financial Resource Strain: Low Risk  (05/19/2024)   Overall Financial Resource Strain (CARDIA)    Difficulty of Paying Living Expenses: Not hard at all  Food Insecurity: No Food Insecurity (05/19/2024)   Hunger Vital Sign    Worried  About Running Out of Food in the Last Year: Never true    Ran Out of Food in the Last Year: Never true  Transportation Needs: No Transportation Needs (05/19/2024)   PRAPARE - Administrator, Civil Service (Medical): No    Lack of Transportation (Non-Medical): No  Physical Activity: Inactive (05/19/2024)   Exercise Vital Sign    Days of Exercise per Week: 0 days    Minutes of Exercise per Session: 0 min  Stress: No Stress Concern Present (05/19/2024)   [employer] of Occupational Health - Occupational Stress Questionnaire    Feeling of Stress: Not at all  Social Connections: Socially  Integrated (05/19/2024)   Social Connection and Isolation Panel    Frequency of Communication with Friends and Family: More than three times a week    Frequency of Social Gatherings with Friends and Family: More than three times a week    Attends Religious Services: More than 4 times per year    Active Member of [employer] or Organizations: Yes    Attends Engineer, structural: More than 4 times per year    Marital Status: Married     Review of Systems  Constitutional:  Negative for appetite change and unexpected weight change.  HENT:  Negative for congestion and sinus pressure.   Respiratory:  Negative for cough, chest tightness and shortness of breath.   Cardiovascular:  Negative for chest pain, palpitations and leg swelling.  Gastrointestinal:  Negative for abdominal pain, diarrhea, nausea and vomiting.  Genitourinary:  Negative for difficulty urinating and dysuria.  Musculoskeletal:  Negative for joint swelling and myalgias.  Skin:  Negative for color change and rash.  Neurological:        Still with dizziness and unsteadiness - chronic.   Psychiatric/Behavioral:  Negative for agitation and dysphoric mood.        Objective:     BP 128/70   Pulse 69   Temp 98.5 F (36.9 C)   Resp 20   Ht 5' 2 (1.575 m)   Wt 133 lb 8 oz (60.6 kg)   LMP 10/21/1984   SpO2 98%   BMI 24.42 kg/m  Wt Readings from Last 3 Encounters:  05/27/24 133 lb 8 oz (60.6 kg)  05/19/24 131 lb 8 oz (59.6 kg)  05/06/24 143 lb (64.9 kg)    Physical Exam Vitals reviewed.  Constitutional:      General: She is not in acute distress.    Appearance: Normal appearance.  HENT:     Head: Normocephalic and atraumatic.     Right Ear: External ear normal.     Left Ear: External ear normal.     Mouth/Throat:     Pharynx: No oropharyngeal exudate or posterior oropharyngeal erythema.  Eyes:     General: No scleral icterus.       Right eye: No discharge.        Left eye: No discharge.      Conjunctiva/sclera: Conjunctivae normal.  Neck:     Thyroid : No thyromegaly.  Cardiovascular:     Rate and Rhythm: Normal rate and regular rhythm.  Pulmonary:     Effort: No respiratory distress.     Breath sounds: Normal breath sounds. No wheezing.  Abdominal:     General: Bowel sounds are normal.     Palpations: Abdomen is soft.     Tenderness: There is no abdominal tenderness.  Musculoskeletal:        General: No swelling or tenderness.  Cervical back: Neck supple. No tenderness.  Lymphadenopathy:     Cervical: No cervical adenopathy.  Skin:    Findings: No erythema or rash.  Neurological:     Mental Status: She is alert.     Comments: Persistent unsteady gait.   Psychiatric:        Mood and Affect: Mood normal.        Behavior: Behavior normal.         Outpatient Encounter Medications as of 05/27/2024  Medication Sig   acetaminophen  (TYLENOL ) 500 MG tablet Take 1 tablet (500 mg total) by mouth every 6 (six) hours as needed for mild pain (or Fever >/= 101).   amLODipine  (NORVASC ) 5 MG tablet Take 1 tablet (5 mg total) by mouth daily.   aspirin  EC 81 MG tablet Take 1 tablet (81 mg total) by mouth daily. Swallow whole.   azelastine  (ASTELIN ) 0.1 % nasal spray Place 2 sprays into both nostrils 2 (two) times daily. Use in each nostril as directed   Biotin 5 MG CAPS Take 5,000 mcg by mouth daily.   carvedilol  (COREG ) 12.5 MG tablet Take 1 tablet (12.5 mg total) by mouth 2 (two) times daily with a meal.   dorzolamide-timolol (COSOPT) 22.3-6.8 MG/ML ophthalmic solution Place 1 drop into both eyes 2 (two) times daily.   latanoprost (XALATAN) 0.005 % ophthalmic solution 1 drop at bedtime.   levothyroxine  (SYNTHROID ) 50 MCG tablet Take 1 tablet (50 mcg total) by mouth daily before breakfast.   lisinopril  (ZESTRIL ) 40 MG tablet Take 1 tablet (40 mg total) by mouth daily.   pantoprazole  (PROTONIX ) 40 MG tablet TAKE 1 TABLET BY MOUTH EVERY DAY   rivastigmine  (EXELON ) 1.5 MG capsule  Take 1 capsule (1.5 mg total) by mouth 2 (two) times daily.   rosuvastatin  (CRESTOR ) 10 MG tablet Take 1 tablet (10 mg total) by mouth daily.   sertraline  (ZOLOFT ) 25 MG tablet Take 1 tablet (25 mg total) by mouth daily.   timolol (TIMOPTIC) 0.5 % ophthalmic solution Place 1 drop into both eyes 2 (two) times daily.   [DISCONTINUED] HYDROcodone -acetaminophen  (NORCO/VICODIN) 5-325 MG tablet Take 1 tablet by mouth every 6 (six) hours as needed for moderate pain (pain score 4-6). (Patient not taking: Reported on 05/19/2024)   [DISCONTINUED] levothyroxine  (SYNTHROID ) 75 MCG tablet Take 1 tablet (75 mcg total) by mouth daily.   No facility-administered encounter medications on file as of 05/27/2024.     Lab Results  Component Value Date   WBC 5.0 05/22/2024   HGB 14.3 05/22/2024   HCT 43.1 05/22/2024   PLT 225.0 05/22/2024   GLUCOSE 119 (H) 05/22/2024   CHOL 150 05/22/2024   TRIG 161.0 (H) 05/22/2024   HDL 48.50 05/22/2024   LDLDIRECT 88.0 03/22/2021   LDLCALC 69 05/22/2024   ALT 9 05/22/2024   AST 12 05/22/2024   NA 141 05/22/2024   K 4.1 05/22/2024   CL 105 05/22/2024   CREATININE 0.75 05/22/2024   BUN 12 05/22/2024   CO2 29 05/22/2024   TSH 0.28 (L) 05/22/2024   INR 1.0 08/12/2015    DG Ribs Unilateral W/Chest Right Result Date: 12/11/2023 CLINICAL DATA:  Right rib fracture seen on lumbar spine radiograph. EXAM: RIGHT RIBS AND CHEST - 3+ VIEW COMPARISON:  Lumbar spine radiograph dated 12/10/2023. FINDINGS: There is background of emphysema and interstitial coarsening. Minimal bibasilar atelectasis. No focal consolidation, pleural effusion, pneumothorax. The cardiac silhouette is within limits. Atherosclerotic calcification of the aorta. Osteopenia with degenerative changes of the spine.  Minimally displaced fractures of the lateral right ninth and tenth ribs. A 6 mm radiopaque focus over the right renal silhouette likely a stone. IMPRESSION: 1. Minimally displaced fractures of the lateral  right ninth and tenth ribs. No pneumothorax. 2. Emphysema. 3. Probable right renal stone. Electronically Signed   By: Vanetta Chou M.D.   On: 12/11/2023 16:40   DG Lumbar Spine Complete Result Date: 12/10/2023 CLINICAL DATA:  Midline low back pain and tenderness after a fall. Sciatica. EXAM: LUMBAR SPINE - COMPLETE 4+ VIEW COMPARISON:  Lumbar MRI 05/01/2018 FINDINGS: Substantial levoconvex lumbar scoliosis with rotary component. Bony demineralization which reduces diagnostic sensitivity and specificity. Acute right lateral ninth and tenth rib fractures. Loss of intervertebral disc height at all levels in the lumbar spine (chronic). Atherosclerosis is present, including aortoiliac atherosclerotic disease. Calcified uterine fibroid. 1.1 cm anterolisthesis of L5 on S1, formerly 0.7 cm on 05/01/2018. This appears to primarily be degenerative, pars defects at the L5 level are not definitively demonstrated. Mild remote inferior endplate compression anteriorly at T11. Degenerative facet arthropathy bilaterally at L4-5 and L5-S1. IMPRESSION: 1. Acute right lateral ninth and tenth rib fractures. 2. Substantial levoconvex lumbar scoliosis with rotary component. 3. Bony demineralization and degenerative findings. 4. 1.1 cm anterolisthesis of L5 on S1, formerly 0.7 cm on 05/01/2018. This appears to primarily be degenerative, pars defects at the L5 level are not definitively demonstrated. 5.  Aortic Atherosclerosis (ICD10-I70.0). 6. Calcified uterine fibroid. Electronically Signed   By: Ryan Salvage M.D.   On: 12/10/2023 16:35       Assessment & Plan:  Angioleiomyoma Assessment & Plan: Dr Dominica - 07/2022 - recommended annual ultrasound.  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 However the radiologist reported that this could be a malignancy. Hence we will follow-up with his sonogram  annually to assess the left kidney mass with the beginning of next year. 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 A 2.0 cm left renal cyst or parapelvic cyst is also again noted. Renal ultrasound 03/2023 -  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 No other abnormalities.  Saw Dr Dennise 01/28/24 - f/u angiomyolipoma - stable.    Aortic atherosclerosis (HCC) Assessment & Plan: Continue crestor .    Fatty liver Assessment & Plan: Found on ultrasound. Follow liver function tests.    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 Bilirubin level stable.    Hypercholesterolemia Assessment & Plan: Continue crestor . Follow lipid panel and liver function tests. No changes today.  Lab Results  Component Value Date   CHOL 150 05/22/2024   HDL 48.50 05/22/2024   LDLCALC 69 05/22/2024   LDLDIRECT 88.0 03/22/2021   TRIG 161.0 (H) 05/22/2024   CHOLHDL 3 05/22/2024      Primary hypertension Assessment & Plan: Continue amlodipine , lisinopril  (40mg ) and carvedilol  12.5mg  bid.  Continue to spot check her pressures. Follow metabolic panel. No changes today.    Hypothyroidism, unspecified type Assessment & Plan: Recent tsh suppressed. Synthroid  dose just adjusted.  Follow tsh.    Memory change Assessment & Plan: Neuropsych evaluation confirms dementia. Recommended referral to memory clinic.    NPH (normal pressure hydrocephalus) [facility]) Assessment & Plan: Had f/u with neurology at [facility] 03/24/24 - f/u regarding balance impairment, dizziness and memory  problems. Taking sinemet. Has been working with PT. Also has been evaluated at NPH clinic and CSF drainage trial being considered. Discussed with her today. She is not interested at this time. Has seen ENT - diagnosed with left peripheral vestibular hypofunction.  Competed vestibular therapy with no relief. She was found to have dilated ventricles on MRI brain. S/p CSF drainage trial at [facility] without significant improvement in her gait or memory. Reviewed w/up and evaluation. Contact neuropsych - regarding f/u.    Stress Assessment & Plan: Increased stress related to above. Continue zoloft . Has good support. Follow.       Allena Hamilton, MD

## 2024-06-01 ENCOUNTER — Encounter: Payer: Self-pay | Admitting: Internal Medicine

## 2024-06-01 NOTE — Assessment & Plan Note (Signed)
 Continue crestor . Follow lipid panel and liver function tests. No changes today.  Lab Results  Component Value Date   CHOL 150 05/22/2024   HDL 48.50 05/22/2024   LDLCALC 69 05/22/2024   LDLDIRECT 88.0 03/22/2021   TRIG 161.0 (H) 05/22/2024   CHOLHDL 3 05/22/2024

## 2024-06-01 NOTE — Assessment & Plan Note (Signed)
 Increased stress related to above. Continue zoloft . Has good support. Follow.

## 2024-06-01 NOTE — Assessment & Plan Note (Signed)
 Dr Korrapati - 07/2022 - recommended annual ultrasound.  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 However the radiologist reported that this could be a malignancy. Hence we will follow-up with his sonogram annually to assess the left kidney mass with the beginning of next year. 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 A 2.0 cm left renal cyst or parapelvic cyst is also again noted. Renal ultrasound 03/2023 -  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 No other abnormalities.  Saw Dr Dennise 01/28/24 - f/u angiomyolipoma - stable.

## 2024-06-01 NOTE — Assessment & Plan Note (Signed)
 Continue crestor

## 2024-06-01 NOTE — Assessment & Plan Note (Signed)
 Recent tsh suppressed. Synthroid  dose just adjusted.  Follow tsh.

## 2024-06-01 NOTE — Assessment & Plan Note (Signed)
 Continue amlodipine , lisinopril  (40mg ) and carvedilol  12.5mg  bid.  Continue to spot check her pressures. Follow metabolic panel. No changes today.

## 2024-06-01 NOTE — Assessment & Plan Note (Signed)
 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 Bilirubin level stable.

## 2024-06-01 NOTE — Assessment & Plan Note (Signed)
Found on ultrasound.  Follow liver function tests.   

## 2024-06-01 NOTE — Assessment & Plan Note (Signed)
 Had f/u with neurology at [facility] 03/24/24 - f/u regarding balance impairment, dizziness and memory problems. Taking sinemet. Has been working with PT. Also has been evaluated at NPH clinic and CSF drainage trial being considered. Discussed with her today. She is not interested at this time. Has seen ENT - diagnosed with left peripheral vestibular hypofunction. Competed vestibular therapy with no relief. She was found to have dilated ventricles on MRI brain. S/p CSF drainage trial at [facility] without significant improvement in her gait or memory. Reviewed w/up and evaluation. Contact neuropsych - regarding f/u.

## 2024-06-01 NOTE — Assessment & Plan Note (Signed)
 Neuropsych evaluation confirms dementia. Recommended referral to memory clinic.

## 2024-06-04 ENCOUNTER — Telehealth: Payer: Self-pay | Admitting: Internal Medicine

## 2024-06-04 NOTE — Telephone Encounter (Signed)
 Pt husband is aware

## 2024-06-04 NOTE — Telephone Encounter (Signed)
 Please call and notify Ms Siebel husband that I have called neurology ([facility]) regarding follow up and am waiting to hear back.  Thanks.

## 2024-06-11 ENCOUNTER — Encounter

## 2024-06-11 ENCOUNTER — Ambulatory Visit
Admission: RE | Admit: 2024-06-11 | Discharge: 2024-06-11 | Disposition: A | Discharge status: Home / Self Care | Source: Ambulatory Visit | Attending: Internal Medicine | Admitting: Internal Medicine

## 2024-06-11 DIAGNOSIS — Z1231 Encounter for screening mammogram for malignant neoplasm of breast: Secondary | ICD-10-CM | POA: Diagnosis present

## 2024-06-14 ENCOUNTER — Other Ambulatory Visit: Payer: Self-pay | Admitting: Internal Medicine

## 2024-06-14 DIAGNOSIS — E039 Hypothyroidism, unspecified: Secondary | ICD-10-CM

## 2024-06-17 NOTE — Telephone Encounter (Signed)
 Medication was discontinued on 01/10/24 by you so please refuse.

## 2024-07-04 ENCOUNTER — Other Ambulatory Visit (INDEPENDENT_AMBULATORY_CARE_PROVIDER_SITE_OTHER)

## 2024-07-04 DIAGNOSIS — E7800 Pure hypercholesterolemia, unspecified: Secondary | ICD-10-CM

## 2024-07-04 LAB — TSH: TSH: 5.22 m[IU]/L — ABNORMAL HIGH (ref 0.40–4.50)

## 2024-07-05 ENCOUNTER — Ambulatory Visit: Payer: Self-pay | Admitting: Internal Medicine

## 2024-07-05 DIAGNOSIS — E7800 Pure hypercholesterolemia, unspecified: Secondary | ICD-10-CM

## 2024-07-09 ENCOUNTER — Other Ambulatory Visit: Payer: Self-pay | Admitting: Internal Medicine

## 2024-08-05 ENCOUNTER — Other Ambulatory Visit (INDEPENDENT_AMBULATORY_CARE_PROVIDER_SITE_OTHER)

## 2024-08-05 DIAGNOSIS — E7800 Pure hypercholesterolemia, unspecified: Secondary | ICD-10-CM

## 2024-08-06 NOTE — Addendum Note (Signed)
 Addended by: TANDA HARVEY D on: 08/06/2024 11:25 AM   Modules accepted: Orders

## 2024-08-06 NOTE — Addendum Note (Signed)
 Addended by: TANDA HARVEY D on: 08/06/2024 04:10 PM   Modules accepted: Orders

## 2024-08-07 ENCOUNTER — Ambulatory Visit: Payer: Self-pay | Admitting: Internal Medicine

## 2024-08-07 LAB — TSH: TSH: 16.54 u[IU]/mL — ABNORMAL HIGH (ref 0.35–5.50)

## 2024-08-08 ENCOUNTER — Other Ambulatory Visit: Payer: Self-pay | Admitting: Internal Medicine

## 2024-08-08 DIAGNOSIS — I10 Essential (primary) hypertension: Secondary | ICD-10-CM

## 2024-08-08 DIAGNOSIS — E039 Hypothyroidism, unspecified: Secondary | ICD-10-CM

## 2024-08-08 DIAGNOSIS — E7800 Pure hypercholesterolemia, unspecified: Secondary | ICD-10-CM

## 2024-08-08 MED ORDER — LEVOTHYROXINE SODIUM 75 MCG PO TABS: 75.0000 ug | ORAL_TABLET | Freq: Every day | ORAL | 3 refills | Status: DC

## 2024-08-08 NOTE — Progress Notes (Signed)
 Orders placed for future labs and rx sent in for synthroid  75mcg q day to local pharmacy.

## 2024-08-14 ENCOUNTER — Ambulatory Visit: Payer: Self-pay | Admitting: Psychology

## 2024-09-21 ENCOUNTER — Other Ambulatory Visit: Payer: Self-pay | Admitting: Internal Medicine

## 2024-09-22 ENCOUNTER — Other Ambulatory Visit (INDEPENDENT_AMBULATORY_CARE_PROVIDER_SITE_OTHER)

## 2024-09-22 DIAGNOSIS — I10 Essential (primary) hypertension: Secondary | ICD-10-CM

## 2024-09-22 DIAGNOSIS — E039 Hypothyroidism, unspecified: Secondary | ICD-10-CM | POA: Diagnosis not present

## 2024-09-22 DIAGNOSIS — E7800 Pure hypercholesterolemia, unspecified: Secondary | ICD-10-CM | POA: Diagnosis not present

## 2024-09-22 LAB — HEPATIC FUNCTION PANEL
ALT: 4 U/L (ref 0–35)
AST: 12 U/L (ref 0–37)
Albumin: 4.4 g/dL (ref 3.5–5.2)
Alkaline Phosphatase: 67 U/L (ref 39–117)
Bilirubin, Direct: 0.2 mg/dL (ref 0.0–0.3)
Total Bilirubin: 1.4 mg/dL — ABNORMAL HIGH (ref 0.2–1.2)
Total Protein: 6.2 g/dL (ref 6.0–8.3)

## 2024-09-22 LAB — LIPID PANEL
Cholesterol: 127 mg/dL (ref 0–200)
HDL: 45.9 mg/dL (ref >39.00)
LDL Cholesterol: 62 mg/dL (ref 0–99)
NonHDL: 80.78
Total CHOL/HDL Ratio: 3
Triglycerides: 96 mg/dL (ref 0.0–149.0)
VLDL: 19.2 mg/dL (ref 0.0–40.0)

## 2024-09-22 LAB — BASIC METABOLIC PANEL WITH GFR
BUN: 14 mg/dL (ref 6–23)
CO2: 27 meq/L (ref 19–32)
Calcium: 9.8 mg/dL (ref 8.4–10.5)
Chloride: 105 meq/L (ref 96–112)
Creatinine, Ser: 0.66 mg/dL (ref 0.40–1.20)
GFR: 80.35 mL/min (ref >60.00)
Glucose, Bld: 117 mg/dL — ABNORMAL HIGH (ref 70–99)
Potassium: 3.8 meq/L (ref 3.5–5.1)
Sodium: 139 meq/L (ref 135–145)

## 2024-09-22 LAB — TSH: TSH: 0.95 u[IU]/mL (ref 0.35–5.50)

## 2024-09-23 ENCOUNTER — Ambulatory Visit: Payer: Self-pay | Admitting: Internal Medicine

## 2024-09-25 ENCOUNTER — Ambulatory Visit: Admitting: Internal Medicine

## 2024-09-25 ENCOUNTER — Encounter: Payer: Self-pay | Admitting: Internal Medicine

## 2024-09-25 VITALS — BP 124/68 | HR 61 | Temp 97.6°F | Ht 62.0 in | Wt 134.0 lb

## 2024-09-25 DIAGNOSIS — R739 Hyperglycemia, unspecified: Secondary | ICD-10-CM

## 2024-09-25 LAB — POCT GLYCOSYLATED HEMOGLOBIN (HGB A1C): Hemoglobin A1C: 5.5 % (ref 4.0–5.6)

## 2024-09-25 MED ORDER — CARVEDILOL 12.5 MG PO TABS: 12.5000 mg | ORAL_TABLET | Freq: Two times a day (BID) | ORAL | 3 refills | Status: DC

## 2024-09-25 MED ORDER — ROSUVASTATIN CALCIUM 10 MG PO TABS: 10.0000 mg | ORAL_TABLET | Freq: Every day | ORAL | 3 refills | Status: AC

## 2024-09-25 MED ORDER — LEVOTHYROXINE SODIUM 75 MCG PO TABS: 75.0000 ug | ORAL_TABLET | Freq: Every day | ORAL | 3 refills | Status: DC

## 2024-09-25 MED ORDER — SERTRALINE HCL 25 MG PO TABS: 25.0000 mg | ORAL_TABLET | Freq: Every day | ORAL | 1 refills | Status: AC

## 2024-09-25 MED ORDER — LISINOPRIL 40 MG PO TABS: 40.0000 mg | ORAL_TABLET | Freq: Every day | ORAL | 3 refills | Status: AC

## 2024-09-25 MED ORDER — AMLODIPINE BESYLATE 5 MG PO TABS: 5.0000 mg | ORAL_TABLET | Freq: Every day | ORAL | 3 refills | Status: AC

## 2024-09-25 MED ORDER — PANTOPRAZOLE SODIUM 40 MG PO TBEC: DELAYED_RELEASE_TABLET | ORAL | 3 refills | Status: AC

## 2024-09-25 NOTE — Progress Notes (Unsigned)
 Subjective:    Patient ID: Crystal Lynch, female    DOB: 23-Jan-1941, 83 y.o.   MRN: [id number]  Patient here for  Chief Complaint  Patient presents with   Medical Management of Chronic Issues    HPI Here for a scheduled follow up - follow up regarding hypercholesterolemia, hypothyroidism, NPH, memory change and hypertension. She is accompanied by her husband. History obtained from both of them. Had f/u with neurology at [facility] 03/24/24 - f/u regarding balance impairment, dizziness and memory problems. Taking sinemet. Worked with PT. Also has been evaluated at NPH clinic and CSF drainage trial being considered. She has not been intrested in pursuing. Has seen ENT - diagnosed with left peripheral vestibular hypofunction. Competed vestibular therapy with no relief. She was found to have dilated ventricles on MRI brain. S/p CSF drainage trial at [facility] without significant improvement in her gait or memory. Neuropsych evaluation confirms dementia. Recommended referral to memory clinic. Continue with rivstigmine. Increased simenet. Continue PT. Had 6 month CTA H/N surveillance (completed 02-04-24 at [facility]) of an incidental right upper cervical ICA pseudoaneurysm. no significant change in right pseudoaneurysm and left ICA dissection. Asymptomatic. Continue aspirin  81mg  for stroke protection. Saw Dr Dennise 01/28/24 - f/u angiomyolipoma - stable. Husband reports that he thought PT helped when she was going, but she does not continue exercise at home. No chest pain or sob reported. Uses a rollator. Sleeps ok. Does nap. Discussed rolling foot pedal.    Past Medical History:  Diagnosis Date   Arthritis    Chicken pox    Endometriosis    Esophagitis    s/p esophageal dilatation   GERD (gastroesophageal reflux disease)    History of ovarian cyst    Hypercholesterolemia    Hypertension    Hypothyroidism    Past Surgical History:  Procedure Laterality Date   ABDOMINAL SURGERY     found to have an  ovarian cyst and endometriosis   APPENDECTOMY     Family History  Problem Relation Age of Onset   Lung disease Father    Heart attack Father    Heart disease Mother        myocardial infarction   Breast cancer Mother 91   Heart attack Mother    Hypertension Mother    Hyperlipidemia Mother    Breast cancer Sister 31   Hypertension Sister    Rheum arthritis Sister    Breast cancer Maternal Aunt 50   Hypertension Brother    Colon cancer Neg Hx    Social History   Socioeconomic History   Marital status: Married    Spouse name: Not on file   Number of children: Not on file   Years of education: Not on file   Highest education level: Associate degree: occupational, scientist, product/process development, or vocational program  Occupational History   Not on file  Tobacco Use   Smoking status: Never   Smokeless tobacco: Never  Vaping Use   Vaping status: Never Used  Substance and Sexual Activity   Alcohol use: No    Alcohol/week: 0.0 standard drinks of alcohol   Drug use: No   Sexual activity: Never  Other Topics Concern   Not on file  Social History Narrative   Married   Social Drivers of Health   Tobacco Use: Low Risk (10/05/2024)   Patient History    Smoking Tobacco Use: Never    Smokeless Tobacco Use: Never    Passive Exposure: Not on file  Financial Resource Strain:  Low Risk (09/21/2024)   Overall Financial Resource Strain (CARDIA)    Difficulty of Paying Living Expenses: Not hard at all  Food Insecurity: Unknown (09/21/2024)   Epic    Worried About Programme Researcher, Broadcasting/film/video in the Last Year: Not on file    [employer] of Food in the Last Year: Never true  Transportation Needs: No Transportation Needs (09/21/2024)   Epic    Lack of Transportation (Medical): No    Lack of Transportation (Non-Medical): No  Physical Activity: Insufficiently Active (09/21/2024)   Exercise Vital Sign    Days of Exercise per Week: 7 days    Minutes of Exercise per Session: 10 min  Stress: Stress Concern Present  (09/21/2024)   [employer] of Occupational Health - Occupational Stress Questionnaire    Feeling of Stress: To some extent  Social Connections: Unknown (09/21/2024)   Social Connection and Isolation Panel    Frequency of Communication with Friends and Family: More than three times a week    Frequency of Social Gatherings with Friends and Family: More than three times a week    Attends Religious Services: More than 4 times per year    Active Member of Clubs or Organizations: Not on file    Attends Banker Meetings: Not on file    Marital Status: Married  Depression (PHQ2-9): Low Risk (09/25/2024)   Depression (PHQ2-9)    PHQ-2 Score: 4  Alcohol Screen: Low Risk (05/19/2024)   Alcohol Screen    Last Alcohol Screening Score (AUDIT): 0  Housing: Low Risk (09/21/2024)   Epic    Unable to Pay for Housing in the Last Year: No    Number of Times Moved in the Last Year: 0    Homeless in the Last Year: No  Utilities: Not At Risk (05/19/2024)   Epic    Threatened with loss of utilities: No  Health Literacy: Inadequate Health Literacy (05/19/2024)   B1300 Health Literacy    Frequency of need for help with medical instructions: Sometimes     Review of Systems  Constitutional:  Negative for appetite change and unexpected weight change.  HENT:  Negative for congestion and sinus pressure.   Respiratory:  Negative for cough, chest tightness and shortness of breath.   Cardiovascular:  Negative for chest pain, palpitations and leg swelling.  Gastrointestinal:  Negative for abdominal pain, diarrhea, nausea and vomiting.  Genitourinary:  Negative for difficulty urinating and dysuria.  Musculoskeletal:  Negative for joint swelling and myalgias.  Skin:  Negative for color change and rash.  Neurological:  Negative for headaches.       Unsteadiness - as outlined.   Psychiatric/Behavioral:  Negative for agitation and dysphoric mood.        Objective:     BP 124/68   Pulse 61    Temp 97.6 F (36.4 C) (Oral)   Ht 5' 2 (1.575 m)   Wt 134 lb (60.8 kg)   LMP 10/21/1984   SpO2 96%   BMI 24.51 kg/m  Wt Readings from Last 3 Encounters:  09/25/24 134 lb (60.8 kg)  05/27/24 133 lb 8 oz (60.6 kg)  05/19/24 131 lb 8 oz (59.6 kg)    Physical Exam Vitals reviewed.  Constitutional:      General: She is not in acute distress.    Appearance: Normal appearance.  HENT:     Head: Normocephalic and atraumatic.     Right Ear: External ear normal.     Left Ear: External  ear normal.     Mouth/Throat:     Pharynx: No oropharyngeal exudate or posterior oropharyngeal erythema.  Eyes:     General: No scleral icterus.       Right eye: No discharge.        Left eye: No discharge.     Conjunctiva/sclera: Conjunctivae normal.  Neck:     Thyroid : No thyromegaly.  Cardiovascular:     Rate and Rhythm: Normal rate and regular rhythm.  Pulmonary:     Effort: No respiratory distress.     Breath sounds: Normal breath sounds. No wheezing.  Abdominal:     General: Bowel sounds are normal.     Palpations: Abdomen is soft.     Tenderness: There is no abdominal tenderness.  Musculoskeletal:        General: No swelling or tenderness.     Cervical back: Neck supple. No tenderness.  Lymphadenopathy:     Cervical: No cervical adenopathy.  Skin:    Findings: No erythema or rash.  Neurological:     Mental Status: She is alert.  Psychiatric:        Mood and Affect: Mood normal.        Behavior: Behavior normal.         Outpatient Encounter Medications as of 09/25/2024  Medication Sig   acetaminophen  (TYLENOL ) 500 MG tablet Take 1 tablet (500 mg total) by mouth every 6 (six) hours as needed for mild pain (or Fever >/= 101).   aspirin  EC 81 MG tablet Take 1 tablet (81 mg total) by mouth daily. Swallow whole.   azelastine  (ASTELIN ) 0.1 % nasal spray Place 2 sprays into both nostrils 2 (two) times daily. Use in each nostril as directed   Biotin 5 MG CAPS Take 5,000 mcg by mouth  daily.   carbidopa-levodopa (SINEMET IR) 25-100 MG tablet Take by mouth.   dorzolamide-timolol (COSOPT) 22.3-6.8 MG/ML ophthalmic solution Place 1 drop into both eyes 2 (two) times daily.   latanoprost (XALATAN) 0.005 % ophthalmic solution 1 drop at bedtime.   rivastigmine  (EXELON ) 1.5 MG capsule TAKE 1 CAPSULE (1.5 MG TOTAL) BY MOUTH TWICE A DAY   timolol (TIMOPTIC) 0.5 % ophthalmic solution Place 1 drop into both eyes 2 (two) times daily.   amLODipine  (NORVASC ) 5 MG tablet Take 1 tablet (5 mg total) by mouth daily.   carvedilol  (COREG ) 12.5 MG tablet Take 1 tablet (12.5 mg total) by mouth 2 (two) times daily with a meal.   levothyroxine  (SYNTHROID ) 75 MCG tablet Take 1 tablet (75 mcg total) by mouth daily before breakfast.   lisinopril  (ZESTRIL ) 40 MG tablet Take 1 tablet (40 mg total) by mouth daily.   pantoprazole  (PROTONIX ) 40 MG tablet TAKE 1 TABLET BY MOUTH EVERY DAY   rosuvastatin  (CRESTOR ) 10 MG tablet Take 1 tablet (10 mg total) by mouth daily.   sertraline  (ZOLOFT ) 25 MG tablet Take 1 tablet (25 mg total) by mouth daily.   [DISCONTINUED] amLODipine  (NORVASC ) 5 MG tablet Take 1 tablet (5 mg total) by mouth daily.   [DISCONTINUED] carvedilol  (COREG ) 12.5 MG tablet Take 1 tablet (12.5 mg total) by mouth 2 (two) times daily with a meal.   [DISCONTINUED] levothyroxine  (SYNTHROID ) 75 MCG tablet Take 1 tablet (75 mcg total) by mouth daily before breakfast.   [DISCONTINUED] lisinopril  (ZESTRIL ) 40 MG tablet Take 1 tablet (40 mg total) by mouth daily.   [DISCONTINUED] pantoprazole  (PROTONIX ) 40 MG tablet TAKE 1 TABLET BY MOUTH EVERY DAY   [DISCONTINUED] rivastigmine  (EXELON )  1.5 MG capsule Take 1 capsule (1.5 mg total) by mouth 2 (two) times daily.   [DISCONTINUED] rosuvastatin  (CRESTOR ) 10 MG tablet Take 1 tablet (10 mg total) by mouth daily.   [DISCONTINUED] sertraline  (ZOLOFT ) 25 MG tablet Take 1 tablet (25 mg total) by mouth daily.   No facility-administered encounter medications on file  as of 09/25/2024.     Lab Results  Component Value Date   WBC 5.0 05/22/2024   HGB 14.3 05/22/2024   HCT 43.1 05/22/2024   PLT 225.0 05/22/2024   GLUCOSE 117 (H) 09/22/2024   CHOL 127 09/22/2024   TRIG 96.0 09/22/2024   HDL 45.90 09/22/2024   LDLDIRECT 88.0 03/22/2021   LDLCALC 62 09/22/2024   ALT 4 09/22/2024   AST 12 09/22/2024   NA 139 09/22/2024   K 3.8 09/22/2024   CL 105 09/22/2024   CREATININE 0.66 09/22/2024   BUN 14 09/22/2024   CO2 27 09/22/2024   TSH 0.95 09/22/2024   INR 1.0 08/12/2015   HGBA1C 5.5 09/25/2024    MM 3D SCREENING MAMMOGRAM BILATERAL BREAST Result Date: 06/13/2024 CLINICAL DATA:  Screening. EXAM: DIGITAL SCREENING BILATERAL MAMMOGRAM WITH TOMOSYNTHESIS AND CAD TECHNIQUE: Bilateral screening digital craniocaudal and mediolateral oblique mammograms were obtained. Bilateral screening digital breast tomosynthesis was performed. The images were evaluated with computer-aided detection. COMPARISON:  Previous exam(s). ACR Breast Density Category b: There are scattered areas of fibroglandular density. FINDINGS: There are no findings suspicious for malignancy. IMPRESSION: No mammographic evidence of malignancy. A result letter of this screening mammogram will be mailed directly to the patient. RECOMMENDATION: Screening mammogram in one year. (Code:SM-B-01Y) BI-RADS CATEGORY  1: Negative. Electronically Signed   By: Dina  Arceo M.D.   On: 06/13/2024 13:16       Assessment & Plan:  Hyperglycemia -     POCT glycosylated hemoglobin (Hb A1C)  Unsteady gait Assessment & Plan:  Evaluated at [facility] (Dr. Lawanda, Dr. Greg, Dr. Jerri). Documentation reviewed. Their team recommended extended lumbar train trial and a DATscan as patient has some parkinsonian features (bradykinesia, rigidity, and abnormal EOMs noted on exam). Lumbar puncture was nondiagnostic as could only drain 8mL of CSF. SPECT scan was unremarkable as well.  Patient completed Lumbar drain trial at  [facility] 05/29/2022, removed 300 cc of CSF fluid, dizziness improved, balance and memory/confusion did not improve. She completed in home PT. Given persistent issues, had appt with [facility] neurology for further evaluation.  Appt as outlined. Placed on sinemet. Also recommended EMG/NCS and CTA head and neck and echo. 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 Recommended baby aspirin  and CTA 6 months. Had f/u CTA 06/2023 and f/u with Dr Phil 07/2023. Recommended baby aspirin  and that she come back in 6 months for f/u CTA.  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 No evidence of motor neuron disease.awaiting further testing. Given persistent balance issues and unsteady gait, was referred for outpatient PT. She had been going 2x/week. Reports feeling stronger - noticed more with being able to get up and down better.  With the possible bradykinesia and balance impairment: she saw neurology ([facility]). S/p alpha-synuclein skin biopsy. Recommended to increase dose of Sinemet 25/100 mg from1 tablet 3 times daily slowly to 2 tablets 3 times daily and recommended continuing PT. Discussed PT. Husband felt may have helped while attending, but she did not continue exercise at home. Discussed movement and exercise. He had questions regarding a rolling foot pedal - coverage. Continue  f/u with neurology.    Stress Assessment & Plan: Continue zoloft . Has good support. Follow.    Memory change Assessment & Plan: Neuropsych evaluation confirms dementia. Recommended referral to memory clinic.    Hypothyroidism, unspecified type Assessment & Plan: On synthroid . Follow tsh.    Primary hypertension Assessment & Plan: Continue amlodipine , lisinopril  (40mg ) and carvedilol  12.5mg  bid.  Continue to spot check her pressures. Follow metabolic panel. No changes today.    Hypercholesterolemia Assessment & Plan: Continue crestor . Follow lipid panel and liver function tests. No changes today.   Lab Results  Component Value Date   CHOL 127 09/22/2024   HDL 45.90 09/22/2024   LDLCALC 62 09/22/2024   LDLDIRECT 88.0 03/22/2021   TRIG 96.0 09/22/2024   CHOLHDL 3 09/22/2024      Angioleiomyoma Assessment & Plan: Dr Dominica - 07/2022 - recommended annual ultrasound.  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 However the radiologist reported that this could be a malignancy. Hence we will follow-up with his sonogram annually to assess the left kidney mass with the beginning of next year. 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 A 2.0 cm left renal cyst or parapelvic cyst is also again noted. Renal ultrasound 03/2023 -  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 No other abnormalities.  Saw Dr Dennise 01/28/24 - f/u angiomyolipoma - stable.    Other orders -     amLODIPine  Besylate; Take 1 tablet (5 mg total) by mouth daily.  Dispense: 90 tablet; Refill: 3 -     Carvedilol ; Take 1 tablet (12.5 mg total) by mouth 2 (two) times daily with a meal.  Dispense: 180 tablet; Refill: 3 -     Levothyroxine  Sodium; Take 1 tablet (75 mcg total) by mouth daily before breakfast.  Dispense: 90 tablet; Refill: 3 -     Lisinopril ; Take 1 tablet (40 mg total) by mouth daily.  Dispense: 90 tablet; Refill: 3 -     Pantoprazole  Sodium; TAKE 1 TABLET BY MOUTH EVERY DAY  Dispense: 90 tablet; Refill: 3 -     Rosuvastatin  Calcium ; Take 1 tablet (10 mg total) by mouth daily.  Dispense: 90 tablet; Refill: 3 -     Sertraline  HCl; Take 1 tablet (25 mg total) by mouth daily.  Dispense: 90 tablet; Refill: 1     Allena Hamilton, MD

## 2024-09-26 ENCOUNTER — Ambulatory Visit: Payer: Self-pay | Admitting: Internal Medicine

## 2024-10-05 ENCOUNTER — Encounter: Payer: Self-pay | Admitting: Internal Medicine

## 2024-10-05 NOTE — Assessment & Plan Note (Signed)
 Continue amlodipine , lisinopril  (40mg ) and carvedilol  12.5mg  bid.  Continue to spot check her pressures. Follow metabolic panel. No changes today.

## 2024-10-05 NOTE — Assessment & Plan Note (Signed)
 Neuropsych evaluation confirms dementia. Recommended referral to memory clinic.

## 2024-10-05 NOTE — Assessment & Plan Note (Signed)
 Dr Korrapati - 07/2022 - recommended annual ultrasound.  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 However the radiologist reported that this could be a malignancy. Hence we will follow-up with his sonogram annually to assess the left kidney mass with the beginning of next year. 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 A 2.0 cm left renal cyst or parapelvic cyst is also again noted. Renal ultrasound 03/2023 -  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 No other abnormalities.  Saw Dr Dennise 01/28/24 - f/u angiomyolipoma - stable.

## 2024-10-05 NOTE — Assessment & Plan Note (Signed)
 Evaluated at [facility] (Dr. Lawanda, Dr. Greg, Dr. Jerri). Documentation reviewed. Their team recommended extended lumbar train trial and a DATscan as patient has some parkinsonian features (bradykinesia, rigidity, and abnormal EOMs noted on exam). Lumbar puncture was nondiagnostic as could only drain 8mL of CSF. SPECT scan was unremarkable as well.  Patient completed Lumbar drain trial at [facility] 05/29/2022, removed 300 cc of CSF fluid, dizziness improved, balance and memory/confusion did not improve. She completed in home PT. Given persistent issues, had appt with [facility] neurology for further evaluation.  Appt as outlined. Placed on sinemet. Also recommended EMG/NCS and CTA head and neck and echo. 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 Recommended baby aspirin  and CTA 6 months. Had f/u CTA 06/2023 and f/u with Dr Phil 07/2023. Recommended baby aspirin  and that she come back in 6 months for f/u CTA.  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 No evidence of motor neuron disease.awaiting further testing. Given persistent balance issues and unsteady gait, was referred for outpatient PT. She had been going 2x/week. Reports feeling stronger - noticed more with being able to get up and down better.  With the possible bradykinesia and balance impairment: she saw neurology ([facility]). S/p alpha-synuclein skin biopsy. Recommended to increase dose of Sinemet 25/100 mg from1 tablet 3 times daily slowly to 2 tablets 3 times daily and recommended continuing PT. Discussed PT. Husband felt may have helped while attending, but she did not continue exercise at home. Discussed movement and exercise. He had questions regarding a rolling foot pedal - coverage. Continue f/u with neurology.

## 2024-10-05 NOTE — Assessment & Plan Note (Signed)
 Continue crestor . Follow lipid panel and liver function tests. No changes today.  Lab Results  Component Value Date   CHOL 127 09/22/2024   HDL 45.90 09/22/2024   LDLCALC 62 09/22/2024   LDLDIRECT 88.0 03/22/2021   TRIG 96.0 09/22/2024   CHOLHDL 3 09/22/2024

## 2024-10-05 NOTE — Assessment & Plan Note (Signed)
On synthroid.  Follow tsh.   

## 2024-10-05 NOTE — Assessment & Plan Note (Signed)
 Continue zoloft. Has good support. Follow.

## 2024-10-12 DIAGNOSIS — R531 Weakness: Secondary | ICD-10-CM | POA: Insufficient documentation

## 2024-10-14 ENCOUNTER — Telehealth: Payer: Self-pay | Admitting: Internal Medicine

## 2024-10-14 DIAGNOSIS — G4730 Sleep apnea, unspecified: Secondary | ICD-10-CM

## 2024-10-14 NOTE — Telephone Encounter (Signed)
 Order placed for pulmonary referral.

## 2024-10-14 NOTE — Addendum Note (Signed)
 Addended by: GLENDIA ALLENA RAMAN on: 10/14/2024 09:09 AM   Modules accepted: Orders

## 2024-10-14 NOTE — Telephone Encounter (Signed)
 Please call and notify Crystal Lynch that I received a note from nurse practitioner Crystal Lynch regarding Crystal Lynch history of sleep apnea. Given it has been a while since this has been assessed, I would like to refer her to Crystal Lynch (sleep specialist/sleep apnea specialist) to reevaluated. She can arrange for testing to be done in the home if needed.  If agreeable, let me know and I can place order for the referral.

## 2024-10-28 ENCOUNTER — Encounter: Payer: Self-pay | Admitting: Sleep Medicine

## 2024-10-28 DIAGNOSIS — N83201 Unspecified ovarian cyst, right side: Secondary | ICD-10-CM | POA: Insufficient documentation

## 2024-10-30 ENCOUNTER — Encounter: Payer: Self-pay | Admitting: Sleep Medicine

## 2024-10-30 ENCOUNTER — Ambulatory Visit (INDEPENDENT_AMBULATORY_CARE_PROVIDER_SITE_OTHER): Admitting: Sleep Medicine

## 2024-10-30 VITALS — BP 120/80 | HR 65 | Temp 98.6°F | Ht 62.0 in | Wt 134.5 lb

## 2024-10-30 DIAGNOSIS — I10 Essential (primary) hypertension: Secondary | ICD-10-CM

## 2024-10-30 DIAGNOSIS — R0683 Snoring: Secondary | ICD-10-CM

## 2024-10-30 DIAGNOSIS — G4710 Hypersomnia, unspecified: Secondary | ICD-10-CM

## 2024-10-30 NOTE — Progress Notes (Signed)
 "      Name:Crystal Lynch MRN: [id number] DOB: [birth year]   CHIEF COMPLAINT:  EXCESSIVE DAYTIME SLEEPINESS   HISTORY OF PRESENT ILLNESS: [name] is a 84 y.o. w/ a h/o OSA, HTN, hypothyroidism, dementia and GERD who presents for reassessment of OSA. Reports that she was initially diagnosed with severe positional OSA in 2023 and was subsequently started on CPAP therapy. Reports only using CPAP therapy for a brief period and discontinued use due to discomfort.   Reports c/o loud snoring and excessive daytime sleepiness which has been present for several years. Denies nocturnal awakenings. Denies any significant weight changes. Denies morning headaches, RLS symptoms, dream enactment, cataplexy, hypnagogic or hypnapompic hallucinations. Denies a family history of sleep apnea. Denies drowsy driving. Drinks decaf coffee occasionally, denies alcohol, tobacco or illicit drug use.   Bedtime 10:30 pm Sleep onset 20 mins Rise time 6:30-7:30 am   EPWORTH SLEEP SCORE 15    10/30/2024    8:00 AM  Results of the Epworth flowsheet  Sitting and reading 2  Watching TV 2  Sitting, inactive in a public place (e.g. a theatre or a meeting) 1  As a passenger in a car for an hour without a break 3  Lying down to rest in the afternoon when circumstances permit 3  Sitting and talking to someone 1  Sitting quietly after a lunch without alcohol 3  In a car, while stopped for a few minutes in traffic 0  Total score 15    PAST MEDICAL HISTORY :   has a past medical history of Arthritis, Chicken pox, Endometriosis, Escherichia coli urinary tract infection (12/14/2021), Esophagitis, GERD (gastroesophageal reflux disease), History of ovarian cyst, Hypercholesterolemia, Hypertension, and Hypothyroidism.  has a past surgical history that includes Abdominal surgery and Appendectomy. Prior to Admission medications  Medication Sig Start Date End Date Taking? Authorizing Provider  acetaminophen  (TYLENOL ) 500 MG  tablet Take 1 tablet (500 mg total) by mouth every 6 (six) hours as needed for mild pain (or Fever >/= 101). 06/14/22  Yes Alexander, Natalie, DO  amLODipine  (NORVASC ) 5 MG tablet Take 1 tablet (5 mg total) by mouth daily. 09/25/24  Yes Glendia Shad, MD  aspirin  EC 81 MG tablet Take 1 tablet (81 mg total) by mouth daily. Swallow whole. 01/10/24  Yes Glendia Shad, MD  azelastine  (ASTELIN ) 0.1 % nasal spray Place 2 sprays into both nostrils 2 (two) times daily. Use in each nostril as directed 12/10/23  Yes Maribeth Camellia MATSU, MD  Biotin 5 MG CAPS Take 5,000 mcg by mouth daily.   Yes [provider]  carbidopa-levodopa (SINEMET IR) 25-100 MG tablet Take by mouth. 08/02/24  Yes [provider]  carvedilol  (COREG ) 12.5 MG tablet Take 1 tablet (12.5 mg total) by mouth 2 (two) times daily with a meal. 09/25/24  Yes Glendia Shad, MD  dorzolamide-timolol (COSOPT) 22.3-6.8 MG/ML ophthalmic solution Place 1 drop into both eyes 2 (two) times daily.   Yes [provider]  latanoprost (XALATAN) 0.005 % ophthalmic solution 1 drop at bedtime. 12/15/23  Yes [provider]  levothyroxine  (SYNTHROID ) 75 MCG tablet Take 1 tablet (75 mcg total) by mouth daily before breakfast. 09/25/24  Yes Glendia Shad, MD  lisinopril  (ZESTRIL ) 40 MG tablet Take 1 tablet (40 mg total) by mouth daily. 09/25/24  Yes Glendia Shad, MD  pantoprazole  (PROTONIX ) 40 MG tablet TAKE 1 TABLET BY MOUTH EVERY DAY 09/25/24  Yes Glendia Shad, MD  rivastigmine  (EXELON ) 1.5 MG capsule TAKE 1 CAPSULE (  1.5 MG TOTAL) BY MOUTH TWICE A DAY 09/24/24  Yes Glendia Shad, MD  rosuvastatin  (CRESTOR ) 10 MG tablet Take 1 tablet (10 mg total) by mouth daily. 09/25/24  Yes Glendia Shad, MD  sertraline  (ZOLOFT ) 25 MG tablet Take 1 tablet (25 mg total) by mouth daily. 09/25/24  Yes Glendia Shad, MD  timolol (TIMOPTIC) 0.5 % ophthalmic solution Place 1 drop into both eyes 2 (two) times daily.   Yes [provider]   Allergies[1]  FAMILY HISTORY:  family history includes Breast cancer (age of onset: 51) in her maternal aunt; Breast cancer (age of onset: 24) in her sister; Breast cancer (age of onset: 69) in her mother; Heart attack in her father and mother; Heart disease in her mother; Hyperlipidemia in her mother; Hypertension in her brother, mother, and sister; Lung disease in her father; Rheum arthritis in her sister. SOCIAL HISTORY:  reports that she has never smoked. She has never used smokeless tobacco. She reports that she does not drink alcohol and does not use drugs.   Review of Systems:  Gen:  Denies  fever, sweats, chills weight loss  HEENT: Denies blurred vision, double vision, ear pain, eye pain, hearing loss, nose bleeds, sore throat Cardiac:  No dizziness, chest pain or heaviness, chest tightness,edema, No JVD Resp:   No cough, -sputum production, -shortness of breath,-wheezing, -hemoptysis,  Gi: Denies swallowing difficulty, stomach pain, nausea or vomiting, diarrhea, constipation, bowel incontinence Gu:  Denies bladder incontinence, burning urine Ext:   Denies Joint pain, stiffness or swelling Skin: Denies  skin rash, easy bruising or bleeding or hives Endoc:  Denies polyuria, polydipsia , polyphagia or weight change Psych:   Denies depression, insomnia or hallucinations  Other:  All other systems negative  VITAL SIGNS: BP 120/80   Pulse 65   Temp 98.6 F (37 C)   Ht 5' 2 (1.575 m)   Wt 134 lb 8 oz (61 kg)   LMP 10/21/1984   SpO2 95%   BMI 24.60 kg/m    Physical Examination:   General Appearance: No distress  EYES PERRLA, EOM intact.   NECK Supple, No JVD Pulmonary: normal breath sounds, No wheezing.  CardiovascularNormal S1,S2.  No m/r/g.   Abdomen: Benign, Soft, non-tender. Skin:   warm, no rashes, no ecchymosis  Extremities: normal, no cyanosis, clubbing. Neuro:without focal findings,  speech normal  PSYCHIATRIC: Mood, affect within normal  limits.   ASSESSMENT AND PLAN  OSA I suspect that OSA is likely present due to clinical presentation. Discussed the consequences of untreated sleep apnea. Advised not to drive drowsy for safety of patient and others. Will complete further evaluation with a home sleep study and follow up to review results.    HTN Stable, on current management. Following with PCP.    MEDICATION ADJUSTMENTS/LABS AND TESTS ORDERED: Recommend Sleep Study   Patient  satisfied with Plan of action and management. All questions answered  Follow up to review HST results and treatment plan.   I spent a total of 52 minutes reviewing chart data, face-to-face evaluation with the patient, counseling and coordination of care as detailed above.    Apryl Brymer, M.D.  Sleep Medicine [facility]           [1]  Allergies Allergen Reactions   Meloxicam     BP issues   "

## 2024-10-30 NOTE — Patient Instructions (Signed)
 Crystal Lynch

## 2024-11-01 ENCOUNTER — Other Ambulatory Visit: Payer: Self-pay

## 2024-11-01 ENCOUNTER — Emergency Department: Admission: EM | Admit: 2024-11-01 | Discharge: 2024-11-01 | Disposition: A | Discharge status: Home / Self Care

## 2024-11-01 ENCOUNTER — Other Ambulatory Visit: Payer: Self-pay | Admitting: Internal Medicine

## 2024-11-01 ENCOUNTER — Emergency Department

## 2024-11-01 DIAGNOSIS — R55 Syncope and collapse: Secondary | ICD-10-CM | POA: Diagnosis present

## 2024-11-01 DIAGNOSIS — E039 Hypothyroidism, unspecified: Secondary | ICD-10-CM | POA: Insufficient documentation

## 2024-11-01 DIAGNOSIS — I10 Essential (primary) hypertension: Secondary | ICD-10-CM | POA: Insufficient documentation

## 2024-11-01 DIAGNOSIS — F0390 Unspecified dementia without behavioral disturbance: Secondary | ICD-10-CM | POA: Diagnosis not present

## 2024-11-01 DIAGNOSIS — W1839XA Other fall on same level, initial encounter: Secondary | ICD-10-CM | POA: Diagnosis not present

## 2024-11-01 LAB — URINALYSIS, ROUTINE W REFLEX MICROSCOPIC
Bacteria, UA: NONE SEEN
Bilirubin Urine: NEGATIVE
Glucose, UA: NEGATIVE mg/dL
Ketones, ur: NEGATIVE mg/dL
Nitrite: NEGATIVE
Protein, ur: NEGATIVE mg/dL
Specific Gravity, Urine: 1.015 (ref 1.005–1.030)
pH: 5 (ref 5.0–8.0)

## 2024-11-01 LAB — CBC
HCT: 42.6 % (ref 36.0–46.0)
Hemoglobin: 14 g/dL (ref 12.0–15.0)
MCH: 29.9 pg (ref 26.0–34.0)
MCHC: 32.9 g/dL (ref 30.0–36.0)
MCV: 91 fL (ref 80.0–100.0)
Platelets: 223 K/uL (ref 150–400)
RBC: 4.68 MIL/uL (ref 3.87–5.11)
RDW: 12 % (ref 11.5–15.5)
WBC: 7.3 K/uL (ref 4.0–10.5)
nRBC: 0 % (ref 0.0–0.2)

## 2024-11-01 LAB — COMPREHENSIVE METABOLIC PANEL WITH GFR
ALT: <5 U/L (ref 0–44)
AST: 18 U/L (ref 15–41)
Albumin: 4.4 g/dL (ref 3.5–5.0)
Alkaline Phosphatase: 89 U/L (ref 38–126)
Anion gap: 10 (ref 5–15)
BUN: 12 mg/dL (ref 8–23)
CO2: 23 mmol/L (ref 22–32)
Calcium: 10 mg/dL (ref 8.9–10.3)
Chloride: 106 mmol/L (ref 98–111)
Creatinine, Ser: 0.77 mg/dL (ref 0.44–1.00)
GFR, Estimated: >60 mL/min
Glucose, Bld: 139 mg/dL — ABNORMAL HIGH (ref 70–99)
Potassium: 4.2 mmol/L (ref 3.5–5.1)
Sodium: 139 mmol/L (ref 135–145)
Total Bilirubin: 1.5 mg/dL — ABNORMAL HIGH (ref 0.0–1.2)
Total Protein: 6.5 g/dL (ref 6.5–8.1)

## 2024-11-01 LAB — TROPONIN T, HIGH SENSITIVITY
Troponin T High Sensitivity: <15 ng/L (ref 0–19)
Troponin T High Sensitivity: <15 ng/L (ref 0–19)

## 2024-11-01 NOTE — ED Provider Notes (Signed)
 "  [facility] Provider Note    Event Date/Time   First MD Initiated Contact with Patient 11/01/24 1257     (approximate)   History   Loss of Consciousness  First nurse note: Pt to ED via POV from [facility]. Husband found her after hearing her fall from other room. Unsure of LOC. Pt reports feeling nauseas prior to fall.   Pt to ED with husband from [facility] for syncopal episode this morning. Pt was nauseous and then got dizzy and fell. Husband heard her fall in the other room. He found her lying on back, eyes rolled back, gurgling and not responding for a few seconds.  Pt denies pain. No obvious injuries/trauma. Pt is fully oriented. Speech is clear. Skin dry.    HPI Crystal Lynch is a 84 y.o. female PMH hypertension, hypothyroidism, hyponatremia, dementia, prolonged QT, esophagitis presents for evaluation after fall - Patient was complaining of feeling nauseous this morning, used her walker to go to the bathroom and then has been her to fall.  Went to the room and found her laying with her eyes rolled back and she was not responsive.  Says this lasted 15 to 30 seconds and then she quickly reoriented but felt quite weak and was unable to sit up on her own.  Was subsequently able to walk around the home but he decided that she should be medically evaluated.  Initially went to clinic but were sent to ED for eval. - Has otherwise been in her usual state of health.  No recent urinary symptoms.  No cough, congestion, shortness of breath.  No preceding chest pain, palpitations. - Patient does not have full memory of the event - She is at her baseline mental status per husband bedside  Per chart review, patient was seen in clinic on 10/09/2024 by geriatrics for mobility and cognitive issues.  Uses a walker in the home, wheelchair for longer distances.  Unable to stand without holding onto something to support herself.     Physical Exam   Triage Vital Signs: ED Triage Vitals   Encounter Vitals Group     BP 11/01/24 1001 (!) 131/59     Girls Systolic BP Percentile --      Girls Diastolic BP Percentile --      Boys Systolic BP Percentile --      Boys Diastolic BP Percentile --      Pulse Rate 11/01/24 1001 (!) 54     Resp 11/01/24 1001 18     Temp 11/01/24 1001 97.6 F (36.4 C)     Temp Source 11/01/24 1001 Oral     SpO2 11/01/24 1001 93 %     Weight 11/01/24 0959 134 lb 7.7 oz (61 kg)     Height 11/01/24 0959 5' 2 (1.575 m)     Head Circumference --      Peak Flow --      Pain Score 11/01/24 0958 0     Pain Loc --      Pain Education --      Exclude from Growth Chart --     Most recent vital signs: Vitals:   11/01/24 1001 11/01/24 1400  BP: (!) 131/59 (!) 149/66  Pulse: (!) 54 62  Resp: 18 16  Temp: 97.6 F (36.4 C)   SpO2: 93% 100%     General: Awake, no distress.  CV:  Good peripheral perfusion. RRR, RP 2+ Resp:  Normal effort. CTAB Abd:  No distention.  Nontender to deep palpation throughout Neuro:  Aox4, CN II-XII intact, FNF wnl, finger taps fast b/l, 5/5 strength in bilateral finger extension/grip, arm flexion/extension, EHL/FHL. BUE AG 10+ sec no drift, BLE AG 5+ sec no drift.  Gait testing deferred on initial eval.  SI LT.    ED Results / Procedures / Treatments   Labs (all labs ordered are listed, but only abnormal results are displayed) Labs Reviewed  COMPREHENSIVE METABOLIC PANEL WITH GFR - Abnormal; Notable for the following components:      Result Value   Glucose, Bld 139 (*)    Total Bilirubin 1.5 (*)    All other components within normal limits  URINALYSIS, ROUTINE W REFLEX MICROSCOPIC - Abnormal; Notable for the following components:   Color, Urine YELLOW (*)    APPearance CLEAR (*)    Hgb urine dipstick MODERATE (*)    Leukocytes,Ua TRACE (*)    All other components within normal limits  CBC  CBG MONITORING, ED  TROPONIN T, HIGH SENSITIVITY  TROPONIN T, HIGH SENSITIVITY     EKG  See ED course  below   RADIOLOGY Radiology interpreted by myself radiology report reviewed.  No acute pathology identified.    PROCEDURES:  Critical Care performed: No  Procedures   MEDICATIONS ORDERED IN ED: Medications - No data to display   IMPRESSION / MDM / ASSESSMENT AND PLAN / ED COURSE  I reviewed the triage vital signs and the nursing notes.                              DDX/MDM/AP: Differential diagnosis includes, but is not limited to, vagal episode, consider transient arrhythmia, anemia, underlying electrolyte abnormality.  Consider underlying UTI.  No obvious respiratory symptoms to suggest pneumonia or URI.  Consider intracranial hemorrhage, C-spine injury.  Plan: - Labs - EKG - CT head, CT C-spine  Patient's presentation is most consistent with acute presentation with potential threat to life or bodily function.  The patient is on the cardiac monitor to evaluate for evidence of arrhythmia and/or significant heart rate changes.  ED course below.  Laboratory workup unremarkable including serial troponins.  Patient feeling very well here in emergency department.  Ambulation trial reassuring.  Urinalysis with no evidence of infection.  In shared decision making, patient and family prefer discharge home as opposed to hospital admission which I believe is reasonable.  Plan for close PMD follow-up.  No evidence of acute pathology at this time but they are low risk reculture worsening symptoms.  ED return precautions in place.  Clinical Course as of 11/01/24 1517  Sat Nov 01, 2024  1326 CBC, CMP reviewed, overall unremarkable [MM]  1326 Serial troponins normal [MM]  1327 CTH: IMPRESSION: 1. No acute intracranial abnormality or acute traumatic injury identified 2. Stable chronic ventriculomegaly, mild chronic white matter disease.   [MM]  1327 CTCspine: IMPRESSION: 1. No acute traumatic injury identified in the cervical spine. 2. 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   [MM]  1327 Ecg = sinus bradycardia, rate 54, no gross ST elevation or depression, no significant repolarization abnormality, normal axis, normal intervals.  No clear evidence of ischemia nor arrhythmia on my interpretation. [MM]  1404 She reportedly ambulated with steady gait with walker, husband bedside confirms this [MM]  1423 UA not c/w infxn [MM]  1447 Patient reevaluated, remains feeling very well here and has been ambulatory without difficulty.  In shared decision making with patient and family, offered admission but they prefer discharge home with close outpatient follow-up which I believe is reasonable.  No clear evidence of acute pathology today.  Understand risk of recurrence or worsening of symptoms.  Will plan to follow-up closely with PMD.  Overall suspect likely vasovagal episode.  ED return precautions in place.  Patient and family agree with plan. [MM]    Clinical Course User Index [MM] Clarine Ozell LABOR, MD     FINAL CLINICAL IMPRESSION(S) / ED DIAGNOSES   Final diagnoses:  Syncope, unspecified syncope type     Rx / DC Orders   ED Discharge Orders     None        Note:  This document was prepared using Dragon voice recognition software and may include unintentional dictation errors.   Clarine Ozell LABOR, MD 11/01/24 1517  "

## 2024-11-01 NOTE — ED Triage Notes (Signed)
 Pt to ED with husband from [facility] for syncopal episode this morning. Pt was nauseous and then got dizzy and fell. Husband heard her fall in the other room. He found her lying on back, eyes rolled back, gurgling and not responding for a few seconds.  Pt denies pain. No obvious injuries/trauma. Pt is fully oriented. Speech is clear. Skin dry.

## 2024-11-01 NOTE — Discharge Instructions (Signed)
 Your evaluation in the emergency department was overall reassuring.  I am unsure as to the exact cause of your having passed out today, but we saw no concerning findings on our ED evaluation.  You preferred discharge home as opposed to admission to the hospital which I do believe is reasonable, but I do recommend that you follow-up closely with your primary care provider for reevaluation.  Return to the emergency department with any new or worsening symptoms.

## 2024-11-01 NOTE — ED Triage Notes (Signed)
 First nurse note: Pt to ED via POV from [facility]. Husband found her after hearing her fall from other room. Unsure of LOC. Pt reports feeling nauseas prior to fall.

## 2024-11-12 ENCOUNTER — Ambulatory Visit: Admitting: Cardiovascular Disease

## 2024-11-18 ENCOUNTER — Ambulatory Visit

## 2024-11-18 ENCOUNTER — Ambulatory Visit: Attending: Cardiovascular Disease | Admitting: Cardiovascular Disease

## 2024-11-18 ENCOUNTER — Encounter: Payer: Self-pay | Admitting: Cardiovascular Disease

## 2024-11-18 VITALS — BP 140/62 | HR 56 | Ht 62.0 in | Wt 136.0 lb

## 2024-11-18 DIAGNOSIS — R001 Bradycardia, unspecified: Secondary | ICD-10-CM | POA: Diagnosis not present

## 2024-11-18 DIAGNOSIS — I10 Essential (primary) hypertension: Secondary | ICD-10-CM | POA: Diagnosis not present

## 2024-11-18 DIAGNOSIS — R55 Syncope and collapse: Secondary | ICD-10-CM | POA: Diagnosis not present

## 2024-11-18 DIAGNOSIS — E785 Hyperlipidemia, unspecified: Secondary | ICD-10-CM | POA: Diagnosis not present

## 2024-11-18 MED ORDER — CARVEDILOL 6.25 MG PO TABS: 6.2500 mg | ORAL_TABLET | Freq: Two times a day (BID) | ORAL | 3 refills | Status: AC

## 2024-11-18 NOTE — Progress Notes (Unsigned)
 "    Cardiology Office Note   Date:  11/18/2024   ID:  Crystal, Lynch November 17, 1940, MRN [id number]  PCP:  Glendia Shad, MD  Cardiologist:   Deatrice Cage, MD   Chief Complaint  Patient presents with   Follow-up    12 month follow up for Syncope  / The pt report dizziness and lightheaded at times.  pt has no complaints of chest pain, chest pressure or SOB, medication reviewed verbally with patient .       History of Present Illness: Crystal Lynch is a 84 y.o. female who presents for  a follow-up visit regarding refractory hypertension. She has no previous cardiac history. Other medical problems include hyperlipidemia and GERD.  Renal artery angiography in October 2016 showed no significant renal artery stenosis.  She has normal pressure hydrocephalus and was seen for this at [facility] and [facility]. She had therapeutic lumbar puncture in August, 2023 but had minimal improvement in symptoms and thus no shunt was placed.  Shortly after that, she was hospitalized at [facility] with severe hyponatremia at 116.  She was given 3% saline with improvement.  Spironolactone  was discontinued and she was placed on amlodipine .    She went to the emergency room recently after a fall and loss of consciousness. On the day of presentation, she complained of feeling nauseous and used her walker to go to the bathroom and then she fell and her husband heard the noise.  When he checked on her, she was unresponsive with her eyes rolled back.  The episode lasted for about 15 to 30 seconds and she woke up disoriented.  Her labs in the ED were unremarkable including troponin and glucose.  CT head and neck showed no acute abnormalities.  She has been doing well since then with no recurrent dizziness.  No chest pain or shortness of breath.  Her memory is declining.  Past Medical History:  Diagnosis Date   Arthritis    Chicken pox    Endometriosis    Escherichia coli urinary tract infection 12/14/2021    Esophagitis    s/p esophageal dilatation   GERD (gastroesophageal reflux disease)    History of ovarian cyst    Hypercholesterolemia    Hypertension    Hypothyroidism     Past Surgical History:  Procedure Laterality Date   ABDOMINAL SURGERY     found to have an ovarian cyst and endometriosis   APPENDECTOMY       Current Outpatient Medications  Medication Sig Dispense Refill   acetaminophen  (TYLENOL ) 500 MG tablet Take 1 tablet (500 mg total) by mouth every 6 (six) hours as needed for mild pain (or Fever >/= 101). 30 tablet 0   amLODipine  (NORVASC ) 5 MG tablet Take 1 tablet (5 mg total) by mouth daily. 90 tablet 3   aspirin  EC 81 MG tablet Take 1 tablet (81 mg total) by mouth daily. Swallow whole.     azelastine  (ASTELIN ) 0.1 % nasal spray Place 2 sprays into both nostrils 2 (two) times daily. Use in each nostril as directed 30 mL 1   Biotin 5 MG CAPS Take 5,000 mcg by mouth daily.     carbidopa-levodopa (SINEMET IR) 25-100 MG tablet Take by mouth.     carvedilol  (COREG ) 12.5 MG tablet Take 1 tablet (12.5 mg total) by mouth 2 (two) times daily with a meal. 180 tablet 3   latanoprost (XALATAN) 0.005 % ophthalmic solution 1 drop at bedtime.     levothyroxine  (  SYNTHROID ) 75 MCG tablet TAKE 1 TABLET BY MOUTH DAILY BEFORE BREAKFAST. 90 tablet 1   lisinopril  (ZESTRIL ) 40 MG tablet Take 1 tablet (40 mg total) by mouth daily. 90 tablet 3   pantoprazole  (PROTONIX ) 40 MG tablet TAKE 1 TABLET BY MOUTH EVERY DAY 90 tablet 3   rivastigmine  (EXELON ) 1.5 MG capsule TAKE 1 CAPSULE (1.5 MG TOTAL) BY MOUTH TWICE A DAY 180 capsule 3   rosuvastatin  (CRESTOR ) 10 MG tablet Take 1 tablet (10 mg total) by mouth daily. 90 tablet 3   sertraline  (ZOLOFT ) 25 MG tablet Take 1 tablet (25 mg total) by mouth daily. 90 tablet 1   timolol (TIMOPTIC) 0.5 % ophthalmic solution Place 1 drop into both eyes 2 (two) times daily.     No current facility-administered medications for this visit.    Allergies:   Meloxicam     Social History:  The patient  reports that she has never smoked. She has never used smokeless tobacco. She reports that she does not drink alcohol and does not use drugs.   Family History:  The patient's family history includes Breast cancer (age of onset: 63) in her maternal aunt; Breast cancer (age of onset: 50) in her sister; Breast cancer (age of onset: 9) in her mother; Heart attack in her father and mother; Heart disease in her mother; Hyperlipidemia in her mother; Hypertension in her brother, mother, and sister; Lung disease in her father; Rheum arthritis in her sister.    ROS:  Please see the history of present illness.   Otherwise, review of systems are positive for none.   All other systems are reviewed and negative.    PHYSICAL EXAM: VS:  BP (!) 140/62 (BP Location: Left Arm, Patient Position: Sitting)   Pulse (!) 56   Ht 5' 2 (1.575 m)   Wt 136 lb (61.7 kg)   LMP 10/21/1984   SpO2 97%   BMI 24.87 kg/m  , BMI Body mass index is 24.87 kg/m. GEN: Well nourished, well developed, in no acute distress  HEENT: normal  Neck: no JVD, carotid bruits, or masses Cardiac: RRR; no murmurs, rubs, or gallops, mild bilateral lower extremity edema Respiratory:  clear to auscultation bilaterally, normal work of breathing GI: soft, nontender, nondistended, + BS MS: no deformity or atrophy  Skin: warm and dry, no rash Neuro:  Strength and sensation are intact Psych: euthymic mood, full affect   EKG:  EKG is ordered today. The ekg ordered today demonstrates : Sinus bradycardia Inferior infarct (cited on or before 01-Nov-2024) Cannot rule out Anterior infarct (cited on or before 01-Nov-2024) When compared with ECG of 01-Nov-2024 10:00, No significant change was found      Recent Labs: 09/22/2024: TSH 0.95 11/01/2024: ALT <5; BUN 12; Creatinine, Ser 0.77; Hemoglobin 14.0; Platelets 223; Potassium 4.2; Sodium 139    Lipid Panel    Component Value Date/Time   CHOL 127  09/22/2024 0923   TRIG 96.0 09/22/2024 0923   HDL 45.90 09/22/2024 0923   CHOLHDL 3 09/22/2024 0923   VLDL 19.2 09/22/2024 0923   LDLCALC 62 09/22/2024 0923   LDLDIRECT 88.0 03/22/2021 1401      Wt Readings from Last 3 Encounters:  11/18/24 136 lb (61.7 kg)  11/01/24 134 lb 7.7 oz (61 kg)  10/30/24 134 lb 8 oz (61 kg)           No data to display            ASSESSMENT AND PLAN:  1.  Syncope: The episode is suggestive of vasovagal syncope.  She is not orthostatic today.  She does have sinus bradycardia today and we will have to exclude arrhythmia.  Recommend evaluation with a 2-week ZIO monitor.  2.  Essential hypertension: Blood pressure is well-controlled on carvedilol , amlodipine  and lisinopril .  I elected to decrease the dose of carvedilol  to 6.25 mg twice daily given sinus bradycardia and recent syncope.  2. Hyperlipidemia: She is currently on rosuvastatin  10 mg once daily.  Most recent LDL was 62.  3.  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   Disposition:   FU with me in 6 months  Signed,  Deatrice Cage, MD  11/18/2024 4:24 PM    [facility] "

## 2024-11-18 NOTE — Patient Instructions (Signed)
 Medication Instructions:  DECREASE Carvedilol  to 6.25 mg twice daily  *If you need a refill on your cardiac medications before your next appointment, please call your pharmacy*  Lab Work: None ordered If you have labs (blood work) drawn today and your tests are completely normal, you will receive your results only by: MyChart Message (if you have MyChart) OR A paper copy in the mail If you have any lab test that is abnormal or we need to change your treatment, we will call you to review the results.  Testing/Procedures: None ordered  Follow-Up: At [facility], you and your health needs are our priority.  As part of our continuing mission to provide you with exceptional heart care, our providers are all part of one team.  This team includes your primary Cardiologist (physician) and Advanced Practice Providers or APPs (Physician Assistants and Nurse Practitioners) who all work together to provide you with the care you need, when you need it.  Your next appointment:   6 month(s)  Provider:   You may see Deatrice Cage, MD or one of the following Advanced Practice Providers on your designated Care Team:   Lonni Meager, NP Lesley Maffucci, PA-C Bernardino Bring, PA-C Cadence Sophia, PA-C Tylene Lunch, NP Barnie Hila, NP    We recommend signing up for the patient portal called MyChart.  Sign up information is provided on this After Visit Summary.  MyChart is used to connect with patients for Virtual Visits (Telemedicine).  Patients are able to view lab/test results, encounter notes, upcoming appointments, etc.  Non-urgent messages can be sent to your provider as well.   To learn more about what you can do with MyChart, go to [email or web address].   Other Instructions ZIO XT- Long Term Monitor Instructions  Your physician has requested you wear a ZIO patch monitor for 14 days.  This is a single patch monitor. [employer] supplies one patch monitor per enrollment. Additional  stickers are not available. Please do not apply patch if you will be having a Nuclear Stress Test, Echocardiogram, Cardiac CT, MRI, or Chest Xray during the period you would be wearing the monitor. The patch cannot be worn during these tests. You cannot remove and re-apply the ZIO XT patch monitor.  Your ZIO patch monitor will be mailed 3 day USPS to your address on file. It may take 3-5 days to receive your monitor after you have been enrolled. Once you have received your monitor, please review the enclosed instructions. Your monitor has already been registered assigning a specific monitor serial number to you.  Billing and Patient Assistance Program Information  We have supplied [employer] with any of your insurance information on file for billing purposes.  [employer] offers a sliding scale Patient Assistance Program for patients that do not have insurance, or whose insurance does not completely cover the cost of the ZIO monitor.  You must apply for the Patient Assistance Program to qualify for this discounted rate.  To apply, please call [employer] at [phone], select option 4, select option 2, ask to apply for Patient Assistance Program. Meredeth will ask your household income, and how many people are in your household. They will quote your out-of-pocket cost based on that information. [employer] will also be able to set up a 37-month, interest-free payment plan if needed.  Applying the monitor   Shave hair from upper left chest.  Hold abrader disc by orange tab. Rub abrader in 40 strokes over the upper left chest as indicated in your monitor  instructions.  Clean area with 4 enclosed alcohol pads. Let dry.  Apply patch as indicated in monitor instructions. Patch will be placed under collarbone on left side of chest with arrow pointing upward.  Rub patch adhesive wings for 2 minutes. Remove white label marked 1. Remove the white label marked 2. Rub patch adhesive wings for 2 additional minutes.  While  looking in a mirror, press and release button in center of patch. A small green light will flash 3-4 times. This will be your only indicator that the monitor has been turned on.  Do not shower for the first 24 hours. You may shower after the first 24 hours.  Press the button if you feel a symptom. You will hear a small click. Record Date, Time and Symptom in the Patient Logbook.  When you are ready to remove the patch, follow instructions on the last 2 pages of Patient Logbook.  Stick patch monitor into the tabs at the bottom of the return box.  Place Patient Logbook in the blue and white box. Use locking tab on box and tape box closed securely. The blue and white box has prepaid postage on it. Please place it in the mailbox as soon as possible. Your physician should have your test results approximately 7-14 days after the monitor has been mailed back to [facility].  Call [facility] Customer Care at [phone] if you have questions regarding your ZIO XT patch monitor.  Call them immediately if you see an orange light blinking on your monitor.  If your monitor falls off in less than 4 days, contact our Monitor department at [phone].  If your monitor becomes loose or falls off after 4 days call [employer] at [phone] for suggestions on securing your monitor.

## 2024-11-19 ENCOUNTER — Encounter: Payer: Self-pay | Admitting: Internal Medicine

## 2024-11-19 DIAGNOSIS — R55 Syncope and collapse: Secondary | ICD-10-CM | POA: Insufficient documentation

## 2025-01-22 ENCOUNTER — Other Ambulatory Visit

## 2025-01-29 ENCOUNTER — Ambulatory Visit: Admitting: Internal Medicine

## 2025-05-22 ENCOUNTER — Ambulatory Visit
